# Patient Record
Sex: Male | Born: 1960 | Race: Black or African American | Hispanic: No | Marital: Single | Smoking: Never smoker
Health system: Southern US, Community
[De-identification: ages and names within clinical notes are randomized; demographics above are authoritative.]

## PROBLEM LIST (undated history)

## (undated) ENCOUNTER — Telehealth

## (undated) ENCOUNTER — Encounter

## (undated) ENCOUNTER — Encounter
Attending: Student in an Organized Health Care Education/Training Program | Primary: Student in an Organized Health Care Education/Training Program

## (undated) ENCOUNTER — Ambulatory Visit: Payer: Medicare (Managed Care)

## (undated) ENCOUNTER — Ambulatory Visit: Payer: MEDICARE

## (undated) ENCOUNTER — Ambulatory Visit
Payer: MEDICARE | Attending: Student in an Organized Health Care Education/Training Program | Primary: Student in an Organized Health Care Education/Training Program

## (undated) ENCOUNTER — Ambulatory Visit
Payer: Medicare (Managed Care) | Attending: Student in an Organized Health Care Education/Training Program | Primary: Student in an Organized Health Care Education/Training Program

## (undated) ENCOUNTER — Encounter: Payer: MEDICARE | Attending: Ophthalmology | Primary: Ophthalmology

## (undated) ENCOUNTER — Ambulatory Visit

## (undated) ENCOUNTER — Telehealth
Attending: Student in an Organized Health Care Education/Training Program | Primary: Student in an Organized Health Care Education/Training Program

## (undated) ENCOUNTER — Other Ambulatory Visit

## (undated) ENCOUNTER — Encounter: Payer: MEDICARE | Attending: Nurse Practitioner | Primary: Nurse Practitioner

## (undated) ENCOUNTER — Encounter: Attending: Nurse Practitioner | Primary: Nurse Practitioner

## (undated) ENCOUNTER — Encounter: Attending: Pharmacist | Primary: Pharmacist

## (undated) ENCOUNTER — Encounter: Attending: Internal Medicine | Primary: Internal Medicine

## (undated) ENCOUNTER — Telehealth: Attending: Dermatology | Primary: Dermatology

## (undated) ENCOUNTER — Non-Acute Institutional Stay: Payer: PRIVATE HEALTH INSURANCE

## (undated) ENCOUNTER — Encounter: Attending: Dermatology | Primary: Dermatology

## (undated) ENCOUNTER — Encounter: Attending: MOHS-Micrographic Surgery | Primary: MOHS-Micrographic Surgery

## (undated) ENCOUNTER — Encounter: Attending: Physician Assistant | Primary: Physician Assistant

## (undated) DIAGNOSIS — A5149 Other secondary syphilitic conditions: Secondary | ICD-10-CM

## (undated) DIAGNOSIS — B20 Human immunodeficiency virus [HIV] disease: Secondary | ICD-10-CM

## (undated) DIAGNOSIS — R569 Unspecified convulsions: Secondary | ICD-10-CM

## (undated) DIAGNOSIS — E119 Type 2 diabetes mellitus without complications: Secondary | ICD-10-CM

## (undated) DIAGNOSIS — H5200 Hypermetropia, unspecified eye: Secondary | ICD-10-CM

## (undated) DIAGNOSIS — B259 Cytomegaloviral disease, unspecified: Secondary | ICD-10-CM

## (undated) DIAGNOSIS — I252 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 infarction: Secondary | ICD-10-CM

## (undated) DIAGNOSIS — M009 Pyogenic arthritis, unspecified: Secondary | ICD-10-CM

## (undated) DIAGNOSIS — J309 Allergic rhinitis, unspecified: Secondary | ICD-10-CM

## (undated) DIAGNOSIS — G609 Hereditary and idiopathic neuropathy, unspecified: Secondary | ICD-10-CM

## (undated) DIAGNOSIS — Z86718 Personal history of other venous thrombosis and embolism: Secondary | ICD-10-CM

## (undated) DIAGNOSIS — K4090 Unilateral inguinal hernia, without obstruction or gangrene, not specified as recurrent: Secondary | ICD-10-CM

## (undated) DIAGNOSIS — H33319 Horseshoe tear of retina without detachment, unspecified eye: Secondary | ICD-10-CM

## (undated) DIAGNOSIS — B081 Molluscum contagiosum: Secondary | ICD-10-CM

## (undated) DIAGNOSIS — I2699 Other pulmonary embolism without acute cor pulmonale: Secondary | ICD-10-CM

## (undated) DIAGNOSIS — IMO0001 Reserved for inherently not codable concepts without codable children: Secondary | ICD-10-CM

## (undated) DIAGNOSIS — L209 Atopic dermatitis, unspecified: Secondary | ICD-10-CM

## (undated) DIAGNOSIS — K6282 Dysplasia of anus: Secondary | ICD-10-CM

## (undated) DIAGNOSIS — Z21 Asymptomatic human immunodeficiency virus [HIV] infection status: Secondary | ICD-10-CM

## (undated) DIAGNOSIS — K219 Gastro-esophageal reflux disease without esophagitis: Secondary | ICD-10-CM

## (undated) DIAGNOSIS — I82409 Acute embolism and thrombosis of unspecified deep veins of unspecified lower extremity: Secondary | ICD-10-CM

## (undated) DIAGNOSIS — J069 Acute upper respiratory infection, unspecified: Secondary | ICD-10-CM

## (undated) DIAGNOSIS — H20039 Secondary infectious iridocyclitis, unspecified eye: Secondary | ICD-10-CM

## (undated) HISTORY — DX: Cytomegaloviral disease, unspecified: B25.9

## (undated) HISTORY — DX: Unspecified convulsions: R56.9

## (undated) HISTORY — DX: Human immunodeficiency virus (HIV) disease: B20

## (undated) HISTORY — DX: Hypermetropia, unspecified eye: H52.00

## (undated) HISTORY — DX: Hereditary and idiopathic neuropathy, unspecified: G60.9

## (undated) HISTORY — DX: Type 2 diabetes mellitus without complications: E11.9

## (undated) HISTORY — DX: Atopic dermatitis, unspecified: L20.9

## (undated) HISTORY — DX: Other secondary syphilitic conditions: A51.49

## (undated) HISTORY — DX: Molluscum contagiosum: B08.1

## (undated) HISTORY — DX: Other pulmonary embolism without acute cor pulmonale: I26.99

## (undated) HISTORY — DX: 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 infarction: I25.2

## (undated) HISTORY — DX: Gastro-esophageal reflux disease without esophagitis: K21.9

## (undated) HISTORY — DX: Acute upper respiratory infection, unspecified: J06.9

## (undated) HISTORY — DX: Allergic rhinitis, unspecified: J30.9

## (undated) HISTORY — DX: Acute embolism and thrombosis of unspecified deep veins of unspecified lower extremity: I82.409

## (undated) HISTORY — PX: COLONOSCOPY: SHX5424

## (undated) HISTORY — DX: Personal history of other venous thrombosis and embolism: Z86.718

## (undated) HISTORY — DX: Horseshoe tear of retina without detachment, unspecified eye: H33.319

## (undated) HISTORY — DX: Secondary infectious iridocyclitis, unspecified eye: H20.039

## (undated) HISTORY — DX: Unilateral inguinal hernia, without obstruction or gangrene, not specified as recurrent: K40.90

## (undated) HISTORY — DX: Dysplasia of anus: K62.82

---
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
## 2010-09-08 DIAGNOSIS — A5149 Other secondary syphilitic conditions: Secondary | ICD-10-CM

## 2010-09-08 DIAGNOSIS — G609 Hereditary and idiopathic neuropathy, unspecified: Secondary | ICD-10-CM

## 2010-09-08 HISTORY — DX: Other secondary syphilitic conditions: A51.49

## 2010-09-08 HISTORY — DX: Hereditary and idiopathic neuropathy, unspecified: G60.9

## 2011-05-09 DIAGNOSIS — I82409 Acute embolism and thrombosis of unspecified deep veins of unspecified lower extremity: Secondary | ICD-10-CM

## 2011-05-09 HISTORY — DX: Acute embolism and thrombosis of unspecified deep veins of unspecified lower extremity: I82.409

## 2012-02-13 DIAGNOSIS — B259 Cytomegaloviral disease, unspecified: Secondary | ICD-10-CM | POA: Insufficient documentation

## 2012-02-13 DIAGNOSIS — H33319 Horseshoe tear of retina without detachment, unspecified eye: Secondary | ICD-10-CM | POA: Insufficient documentation

## 2012-02-13 HISTORY — DX: Cytomegaloviral disease, unspecified: B25.9

## 2012-02-13 HISTORY — DX: Horseshoe tear of retina without detachment, unspecified eye: H33.319

## 2012-02-18 DIAGNOSIS — H5200 Hypermetropia, unspecified eye: Secondary | ICD-10-CM

## 2012-02-18 HISTORY — DX: Hypermetropia, unspecified eye: H52.00

## 2012-09-29 DIAGNOSIS — B081 Molluscum contagiosum: Secondary | ICD-10-CM | POA: Insufficient documentation

## 2012-09-29 HISTORY — DX: Molluscum contagiosum: B08.1

## 2012-12-29 DIAGNOSIS — K6282 Dysplasia of anus: Secondary | ICD-10-CM | POA: Insufficient documentation

## 2013-04-09 DIAGNOSIS — I279 Pulmonary heart disease, unspecified: Secondary | ICD-10-CM | POA: Insufficient documentation

## 2013-04-09 DIAGNOSIS — R569 Unspecified convulsions: Secondary | ICD-10-CM | POA: Insufficient documentation

## 2013-04-09 DIAGNOSIS — G589 Mononeuropathy, unspecified: Secondary | ICD-10-CM | POA: Insufficient documentation

## 2013-04-09 DIAGNOSIS — I5032 Chronic diastolic (congestive) heart failure: Secondary | ICD-10-CM | POA: Insufficient documentation

## 2013-04-09 DIAGNOSIS — I5030 Unspecified diastolic (congestive) heart failure: Secondary | ICD-10-CM | POA: Insufficient documentation

## 2013-04-09 DIAGNOSIS — Z9229 Personal history of other drug therapy: Secondary | ICD-10-CM | POA: Insufficient documentation

## 2013-04-09 DIAGNOSIS — M109 Gout, unspecified: Secondary | ICD-10-CM | POA: Insufficient documentation

## 2013-04-09 DIAGNOSIS — I509 Heart failure, unspecified: Secondary | ICD-10-CM | POA: Insufficient documentation

## 2013-04-09 DIAGNOSIS — Z91040 Latex allergy status: Secondary | ICD-10-CM | POA: Insufficient documentation

## 2013-04-09 DIAGNOSIS — G40909 Epilepsy, unspecified, not intractable, without status epilepticus: Secondary | ICD-10-CM

## 2013-04-09 DIAGNOSIS — I252 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 infarction: Secondary | ICD-10-CM | POA: Insufficient documentation

## 2013-04-09 DIAGNOSIS — K4090 Unilateral inguinal hernia, without obstruction or gangrene, not specified as recurrent: Secondary | ICD-10-CM

## 2013-04-09 DIAGNOSIS — K219 Gastro-esophageal reflux disease without esophagitis: Secondary | ICD-10-CM | POA: Insufficient documentation

## 2013-04-09 DIAGNOSIS — I2699 Other pulmonary embolism without acute cor pulmonale: Secondary | ICD-10-CM

## 2013-04-09 DIAGNOSIS — I10 Essential (primary) hypertension: Secondary | ICD-10-CM | POA: Insufficient documentation

## 2013-04-09 HISTORY — DX: Epilepsy, unspecified, not intractable, without status epilepticus: G40.909

## 2013-04-09 HISTORY — DX: Unspecified convulsions: R56.9

## 2013-04-09 HISTORY — DX: Other pulmonary embolism without acute cor pulmonale: I26.99

## 2013-04-09 HISTORY — DX: Unilateral inguinal hernia, without obstruction or gangrene, not specified as recurrent: K40.90

## 2013-11-11 LAB — HM COLONOSCOPY: HM COLON: NORMAL

## 2014-02-25 ENCOUNTER — Other Ambulatory Visit (HOSPITAL_COMMUNITY)
Admission: RE | Admit: 2014-02-25 | Discharge: 2014-02-25 | Disposition: A | Discharge status: Home / Self Care | Payer: Medicare Other | Source: Ambulatory Visit | Attending: Infectious Disease | Admitting: Infectious Disease

## 2014-02-25 ENCOUNTER — Ambulatory Visit: Payer: Medicare Other

## 2014-02-25 DIAGNOSIS — B081 Molluscum contagiosum: Secondary | ICD-10-CM

## 2014-02-25 DIAGNOSIS — B20 Human immunodeficiency virus [HIV] disease: Secondary | ICD-10-CM

## 2014-02-25 DIAGNOSIS — I509 Heart failure, unspecified: Secondary | ICD-10-CM

## 2014-02-25 DIAGNOSIS — J45909 Unspecified asthma, uncomplicated: Secondary | ICD-10-CM

## 2014-02-25 DIAGNOSIS — Z91040 Latex allergy status: Secondary | ICD-10-CM

## 2014-02-25 DIAGNOSIS — I5032 Chronic diastolic (congestive) heart failure: Secondary | ICD-10-CM

## 2014-02-25 DIAGNOSIS — Z7901 Long term (current) use of anticoagulants: Secondary | ICD-10-CM

## 2014-02-25 DIAGNOSIS — I2699 Other pulmonary embolism without acute cor pulmonale: Secondary | ICD-10-CM

## 2014-02-25 DIAGNOSIS — F3289 Other specified depressive episodes: Secondary | ICD-10-CM

## 2014-02-25 DIAGNOSIS — I279 Pulmonary heart disease, unspecified: Secondary | ICD-10-CM

## 2014-02-25 DIAGNOSIS — Z113 Encounter for screening for infections with a predominantly sexual mode of transmission: Secondary | ICD-10-CM | POA: Insufficient documentation

## 2014-02-25 DIAGNOSIS — I2510 Atherosclerotic heart disease of native coronary artery without angina pectoris: Secondary | ICD-10-CM

## 2014-02-25 DIAGNOSIS — M109 Gout, unspecified: Secondary | ICD-10-CM

## 2014-02-25 DIAGNOSIS — L309 Dermatitis, unspecified: Secondary | ICD-10-CM

## 2014-02-25 DIAGNOSIS — A5149 Other secondary syphilitic conditions: Secondary | ICD-10-CM

## 2014-02-25 DIAGNOSIS — F329 Major depressive disorder, single episode, unspecified: Secondary | ICD-10-CM

## 2014-02-25 LAB — CBC WITH DIFFERENTIAL/PLATELET
BASOS PCT: 1 % (ref 0–1)
Basophils Absolute: 0 10*3/uL (ref 0.0–0.1)
Eosinophils Absolute: 0 10*3/uL (ref 0.0–0.7)
Eosinophils Relative: 1 % (ref 0–5)
HCT: 40.2 % (ref 39.0–52.0)
HEMOGLOBIN: 13.9 g/dL (ref 13.0–17.0)
LYMPHS ABS: 1.1 10*3/uL (ref 0.7–4.0)
LYMPHS PCT: 28 % (ref 12–46)
MCH: 30.3 pg (ref 26.0–34.0)
MCHC: 34.6 g/dL (ref 30.0–36.0)
MCV: 87.6 fL (ref 78.0–100.0)
Monocytes Absolute: 0.4 10*3/uL (ref 0.1–1.0)
Monocytes Relative: 9 % (ref 3–12)
NEUTROS PCT: 61 % (ref 43–77)
Neutro Abs: 2.4 10*3/uL (ref 1.7–7.7)
Platelets: 251 10*3/uL (ref 150–400)
RBC: 4.59 MIL/uL (ref 4.22–5.81)
RDW: 15.1 % (ref 11.5–15.5)
WBC: 3.9 10*3/uL — AB (ref 4.0–10.5)

## 2014-02-25 LAB — COMPLETE METABOLIC PANEL WITH GFR
ALK PHOS: 56 U/L (ref 39–117)
ALT: 24 U/L (ref 0–53)
AST: 23 U/L (ref 0–37)
Albumin: 4 g/dL (ref 3.5–5.2)
BUN: 8 mg/dL (ref 6–23)
CO2: 32 meq/L (ref 19–32)
Calcium: 9.4 mg/dL (ref 8.4–10.5)
Chloride: 101 mEq/L (ref 96–112)
Creat: 0.94 mg/dL (ref 0.50–1.35)
GFR, Est African American: >89 mL/min
Glucose, Bld: 114 mg/dL — ABNORMAL HIGH (ref 70–99)
POTASSIUM: 4 meq/L (ref 3.5–5.3)
Sodium: 138 mEq/L (ref 135–145)
Total Bilirubin: 0.4 mg/dL (ref 0.2–1.2)
Total Protein: 7.5 g/dL (ref 6.0–8.3)

## 2014-02-25 LAB — LIPID PANEL
CHOL/HDL RATIO: 3.2 ratio
CHOLESTEROL: 145 mg/dL (ref 0–200)
HDL: 46 mg/dL (ref >39)
LDL Cholesterol: 76 mg/dL (ref 0–99)
TRIGLYCERIDES: 113 mg/dL (ref <150)
VLDL: 23 mg/dL (ref 0–40)

## 2014-02-25 LAB — RPR

## 2014-02-26 DIAGNOSIS — F32A Depression, unspecified: Secondary | ICD-10-CM | POA: Insufficient documentation

## 2014-02-26 DIAGNOSIS — F3289 Other specified depressive episodes: Secondary | ICD-10-CM | POA: Insufficient documentation

## 2014-02-26 DIAGNOSIS — I2510 Atherosclerotic heart disease of native coronary artery without angina pectoris: Secondary | ICD-10-CM | POA: Insufficient documentation

## 2014-02-26 DIAGNOSIS — F329 Major depressive disorder, single episode, unspecified: Secondary | ICD-10-CM | POA: Insufficient documentation

## 2014-02-26 DIAGNOSIS — J45909 Unspecified asthma, uncomplicated: Secondary | ICD-10-CM | POA: Insufficient documentation

## 2014-02-26 DIAGNOSIS — Z7901 Long term (current) use of anticoagulants: Secondary | ICD-10-CM | POA: Insufficient documentation

## 2014-02-26 DIAGNOSIS — L309 Dermatitis, unspecified: Secondary | ICD-10-CM | POA: Insufficient documentation

## 2014-02-26 LAB — HIV-1 RNA QUANT-NO REFLEX-BLD
HIV 1 RNA Quant: <20 copies/mL (ref <20)
HIV-1 RNA Quant, Log: <1.3 {Log} (ref <1.30)

## 2014-02-26 LAB — URINALYSIS
BILIRUBIN URINE: NEGATIVE
GLUCOSE, UA: NEGATIVE mg/dL
Hgb urine dipstick: NEGATIVE
KETONES UR: NEGATIVE mg/dL
Leukocytes, UA: NEGATIVE
Nitrite: NEGATIVE
Protein, ur: 30 mg/dL — AB
SPECIFIC GRAVITY, URINE: 1.026 (ref 1.005–1.030)
Urobilinogen, UA: 1 mg/dL (ref 0.0–1.0)
pH: 6.5 (ref 5.0–8.0)

## 2014-02-26 LAB — HEPATITIS B CORE ANTIBODY, TOTAL: HEP B C TOTAL AB: REACTIVE — AB

## 2014-02-26 LAB — URINE CYTOLOGY ANCILLARY ONLY
CHLAMYDIA, DNA PROBE: NEGATIVE
Neisseria Gonorrhea: NEGATIVE

## 2014-02-26 LAB — HEPATITIS A ANTIBODY, TOTAL: Hep A Total Ab: REACTIVE — AB

## 2014-02-26 LAB — T-HELPER CELL (CD4) - (RCID CLINIC ONLY)
CD4 T CELL HELPER: 43 % (ref 33–55)
CD4 T Cell Abs: 500 /uL (ref 400–2700)

## 2014-02-26 LAB — HEPATITIS B SURFACE ANTIBODY,QUALITATIVE: Hep B S Ab: POSITIVE — AB

## 2014-02-26 LAB — HEPATITIS C ANTIBODY: HCV Ab: NEGATIVE

## 2014-02-26 LAB — HEPATITIS B SURFACE ANTIGEN: Hepatitis B Surface Ag: NEGATIVE

## 2014-02-26 NOTE — Progress Notes (Signed)
Patient is transferring [facility] due to travel distance.  He would also like to transfer his anti-coagulant care to  locally as well.  He has  asked for assistance with housing and a [facility] was [facility].   Medical records received Vaccines updated.  Laurell Josephsammy K King, RN

## 2014-03-01 ENCOUNTER — Telehealth: Payer: Self-pay

## 2014-03-01 NOTE — Telephone Encounter (Signed)
I was not able to reach patient via phone.  Letter sent with information regarding new patient Internal Medicine Referral.   Both appointments are on same day.   He was advised to call if he was not able to keep appointment.  Laurell Josephsammy K Alegandra Sommers, RN

## 2014-03-08 ENCOUNTER — Encounter: Payer: Self-pay | Admitting: Internal Medicine

## 2014-03-08 ENCOUNTER — Ambulatory Visit (INDEPENDENT_AMBULATORY_CARE_PROVIDER_SITE_OTHER): Payer: Medicare Other | Admitting: Pharmacist

## 2014-03-08 ENCOUNTER — Ambulatory Visit (INDEPENDENT_AMBULATORY_CARE_PROVIDER_SITE_OTHER): Payer: Medicare Other | Admitting: Infectious Disease

## 2014-03-08 ENCOUNTER — Encounter: Payer: Self-pay | Admitting: Infectious Disease

## 2014-03-08 ENCOUNTER — Ambulatory Visit (INDEPENDENT_AMBULATORY_CARE_PROVIDER_SITE_OTHER): Payer: Medicare Other | Admitting: Internal Medicine

## 2014-03-08 VITALS — BP 135/80 | HR 77 | Temp 97.8°F | Ht 73.0 in | Wt 233.4 lb

## 2014-03-08 VITALS — BP 146/95 | HR 76 | Temp 97.9°F | Ht 73.0 in | Wt 231.0 lb

## 2014-03-08 DIAGNOSIS — I509 Heart failure, unspecified: Secondary | ICD-10-CM

## 2014-03-08 DIAGNOSIS — L309 Dermatitis, unspecified: Secondary | ICD-10-CM

## 2014-03-08 DIAGNOSIS — Z86718 Personal history of other venous thrombosis and embolism: Secondary | ICD-10-CM | POA: Insufficient documentation

## 2014-03-08 DIAGNOSIS — I2782 Chronic pulmonary embolism: Secondary | ICD-10-CM

## 2014-03-08 DIAGNOSIS — Z86711 Personal history of pulmonary embolism: Secondary | ICD-10-CM | POA: Insufficient documentation

## 2014-03-08 DIAGNOSIS — Z21 Asymptomatic human immunodeficiency virus [HIV] infection status: Secondary | ICD-10-CM | POA: Insufficient documentation

## 2014-03-08 DIAGNOSIS — Z7901 Long term (current) use of anticoagulants: Secondary | ICD-10-CM

## 2014-03-08 DIAGNOSIS — J45909 Unspecified asthma, uncomplicated: Secondary | ICD-10-CM

## 2014-03-08 DIAGNOSIS — I2510 Atherosclerotic heart disease of native coronary artery without angina pectoris: Secondary | ICD-10-CM

## 2014-03-08 DIAGNOSIS — L259 Unspecified contact dermatitis, unspecified cause: Secondary | ICD-10-CM

## 2014-03-08 DIAGNOSIS — I5032 Chronic diastolic (congestive) heart failure: Secondary | ICD-10-CM

## 2014-03-08 DIAGNOSIS — Z5181 Encounter for therapeutic drug level monitoring: Secondary | ICD-10-CM

## 2014-03-08 DIAGNOSIS — B20 Human immunodeficiency virus [HIV] disease: Secondary | ICD-10-CM | POA: Insufficient documentation

## 2014-03-08 DIAGNOSIS — Z23 Encounter for immunization: Secondary | ICD-10-CM

## 2014-03-08 DIAGNOSIS — I10 Essential (primary) hypertension: Secondary | ICD-10-CM

## 2014-03-08 LAB — POCT INR: INR: 2.4

## 2014-03-08 MED ORDER — LISINOPRIL 20 MG PO TABS: 20.0000 mg | ORAL_TABLET | Freq: Every day | ORAL | Status: DC

## 2014-03-08 NOTE — Patient Instructions (Signed)
Please see Dr Alexandria LodgeGroce today for Coumadin management Continue to take your medications as prescribed. Call our office if you need refills or instruct your pharmacy to call us.

## 2014-03-08 NOTE — Assessment & Plan Note (Signed)
Patient doing well with QVAR and Albuterol PRN, continue.

## 2014-03-08 NOTE — Patient Instructions (Signed)
Patient instructed to take medications as defined in the Anti-coagulation Track section of this encounter.  Patient instructed to take today's dose.  Patient was provided all new (for this clinic--as his care is being transferred [facility] warfarin teaching materials as well as his dose response specific instruction sheet showing 7.5mg  on M/Th; 10mg  all other days using his 5mg  warfarin strength tablets.  Patient verbalized understanding of these instructions.

## 2014-03-08 NOTE — Assessment & Plan Note (Addendum)
Patient will continue Coumadin, of note he was taking 5mg  tablets not 7.5mg  tablets and takes a total of 7.5mg  on Monday and Friday and 10mg  other days. He will see Dr. Alexandria LodgeGroce today for INR check.

## 2014-03-08 NOTE — Assessment & Plan Note (Addendum)
Patient weight at roughly baseline (at last weight but +2-3lbs from previous visit) [facility]. Continue ACEi, Lasix 40mg  daily.  Would not start BB at this time given lack of evidence of morbidity or mortality benefit and patient has asthma.  Cholesterol at goal without medication.

## 2014-03-08 NOTE — Progress Notes (Signed)
   Subjective:    Patient ID: Craig [name], male    DOB: May 24, 1961, 53 y.o.   MRN: [id number]  HPI  53 year old African American male with HIV, multiple medical problems including eczema (on MTX), DVT with PE, dCHF, HTN, ? MI, prior AKI who states that I was his ID MD as a [facility] his [facility]. He has been perfectly suppressed on PRezista, Norvir and Truvada.     Review of Systems  Constitutional: Negative for fever, chills, diaphoresis, activity change, appetite change, fatigue and unexpected weight change.  HENT: Negative for congestion, rhinorrhea, sinus pressure, sneezing, sore throat and trouble swallowing.   Eyes: Negative for photophobia and visual disturbance.  Respiratory: Negative for cough, chest tightness, shortness of breath, wheezing and stridor.   Cardiovascular: Negative for chest pain, palpitations and leg swelling.  Gastrointestinal: Negative for nausea, vomiting, abdominal pain, diarrhea, constipation, blood in stool, abdominal distention and anal bleeding.  Genitourinary: Negative for dysuria, hematuria, flank pain and difficulty urinating.  Musculoskeletal: Negative for arthralgias, back pain, gait problem, joint swelling and myalgias.  Skin: Negative for color change, pallor, rash and wound.  Neurological: Negative for dizziness, tremors, weakness and light-headedness.  Hematological: Negative for adenopathy. Does not bruise/bleed easily.  Psychiatric/Behavioral: Negative for behavioral problems, confusion, sleep disturbance, dysphoric mood, decreased concentration and agitation.       Objective:   Physical Exam  Constitutional: He is oriented to person, place, and time. He appears well-developed and well-nourished. No distress.  HENT:  Head: Normocephalic and atraumatic.  Mouth/Throat: Oropharynx is clear and moist. No oropharyngeal exudate.  Eyes: Conjunctivae and EOM are normal. No scleral icterus.  Neck: Normal range of  motion. Neck supple. No JVD present.  Cardiovascular: Normal rate, regular rhythm and normal heart sounds.  Exam reveals no gallop and no friction rub.   No murmur heard. Pulmonary/Chest: Effort normal and breath sounds normal. No respiratory distress. He has no wheezes. He has no rales. He exhibits no tenderness.  Abdominal: He exhibits no distension and no mass. There is no tenderness. There is no rebound and no guarding.  Musculoskeletal: He exhibits no edema and no tenderness.  Lymphadenopathy:    He has no cervical adenopathy.  Neurological: He is alert and oriented to person, place, and time. He exhibits normal muscle tone. Coordination normal.  Skin: Skin is warm and dry. He is not diaphoretic. No erythema. No pallor.  Psychiatric: He has a normal mood and affect. His behavior is normal. Judgment and thought content normal.          Assessment & Plan:   #1 HIV: continue Prezista, Norvir and Truvada for now. Will change to Prezcobix with Truvada when Medicare starts paying for Prezcobix .45    #2 DVT, PE: in need of monitoring of his coumadin, I suggested that he could have this done by Dr. Alexandria LodgeGroce [facility]  #3 ? MI: Not clear to me that he actually has known CAD or whether he had MI as secondary effect. He is not currently on statin  #3 Eczema: on MTX with folate by Dermatology. MTX may have some beneficial effects on inflammation due to HIV reservoir as well  #4 HTN: at [facility]

## 2014-03-08 NOTE — Progress Notes (Signed)
Anti-Coagulation Progress Note  Craig [name] is a 53 y.o. male who is currently on an anti-coagulation regimen.    RECENT RESULTS: Recent results are below, the most recent result is correlated with a dose of 65 mg. per week: Lab Results  Component Value Date   INR 2.40 03/08/2014    ANTI-COAG DOSE: Anticoagulation Dose Instructions as of 03/08/2014     Glynis SmilesSun Mon Tue Wed Thu Fri Sat   New Dose 10 mg 7.5 mg 10 mg 10 mg 7.5 mg 10 mg 10 mg       ANTICOAG SUMMARY: Anticoagulation Episode Summary   Current INR goal   Next INR check 03/29/2014  INR from last check 2.40 (03/08/2014)  Weekly max dose   Target end date Indefinite  INR check location [facility]  Preferred lab   Send INR reminders to    Indications  History of pulmonary embolus (PE) [V12.55] Long term (current) use of anticoagulants [V58.61] History of DVT (deep vein thrombosis) [V12.51]        Comments       Anticoagulation Care Providers   Provider Role Specialty Phone number   Carlynn PurlErik Hoffman, DO  Internal Medicine [phone]      ANTICOAG TODAY: Anticoagulation Summary as of 03/08/2014   INR goal   Selected INR 2.40 (03/08/2014)  Next INR check 03/29/2014  Target end date Indefinite   Indications  History of pulmonary embolus (PE) [V12.55] Long term (current) use of anticoagulants [V58.61] History of DVT (deep vein thrombosis) [V12.51]      Anticoagulation Episode Summary   INR check location [facility]   Preferred lab    Send INR reminders to    Comments     Anticoagulation Care Providers   Provider Role Specialty Phone number   Carlynn PurlErik Hoffman, DO  Internal Medicine [phone]      PATIENT INSTRUCTIONS: Patient Instructions  Patient instructed to take medications as defined in the Anti-coagulation Track section of this encounter.  Patient instructed to take today's dose.  Patient was provided all new (for this clinic--as his care is being [facility]) warfarin teaching materials as well as his dose response specific instruction sheet showing 7.5mg  on M/Th; 10mg  all other days using his 5mg  warfarin strength tablets.  Patient verbalized understanding of these instructions.       FOLLOW-UP Return in 3 weeks (on 03/29/2014) for Follow up INR at 1100h.  Hulen LusterJames Groce, III Pharm.D., CACP

## 2014-03-08 NOTE — Assessment & Plan Note (Signed)
BP Readings from Last 3 Encounters:  03/08/14 135/80  03/08/14 146/95    Lab Results  Component Value Date   NA 138 02/25/2014   K 4.0 02/25/2014   CREATININE 0.94 02/25/2014    Assessment: Blood pressure control: controlled Progress toward BP goal:  at goal Comments:   Plan: Medications:  Lisinopril 20mg  daily, Lasix 40mg  daily Educational resources provided: brochure Self management tools provided:   Other plans:

## 2014-03-08 NOTE — Patient Instructions (Signed)
We will try to send you new script for  PREZCOBIX 800/150 PINK PILL   TO BE TAKEN WITH  TRUVADA BLUE PILL ONCE DAILY  IF MEDICARE/MEDICAID WILL PAY FOR IT  IF NOT THEN CONTINUE THE PREZISTA NORVIR AND TRUVADA  COME BACK IN ONE MONTH FOR LABS AFTER STARTING THE PREZCOBIX AND TRUVADA

## 2014-03-08 NOTE — Assessment & Plan Note (Signed)
[facility] Dr. Amada KingfisherLugo Somolinos. Stable continue medications, patient will continue to follow with Dermatology.

## 2014-03-08 NOTE — Progress Notes (Signed)
Case discussed with Dr. Hoffman at the time of the visit.  We reviewed the resident's history and exam and pertinent patient test results.  I agree with the assessment, diagnosis, and plan of care documented in the resident's note. 

## 2014-03-08 NOTE — Progress Notes (Signed)
[facility] Subjective:   Patient ID: Craig [name] male   DOB: 08-09-1961 53 y.o.   MRN: [id number]  HPI: Mr.Craig [name] is a 53 y.o. male with a PMH significant for dCHF (last echo 12/29/13 Ef 60-65%, diastolic dysfunction), HIV (02/25/14  Cd4 500, undect VL), HTN, Asthma, Hx DVT/PE (on long term A/C with warfarin), eczema.  He reports infrequent wheezing and no asthma attacks, he uses albuterol inhaler 4-5 times a week. He moved to [place] in late 2014 and wants to reestablish care. He reports today he is feeling well.  He reports he takes all of his medications as prescribed and has not current issues with his medications.  He reports he has not been checking his weight regularly, he does not some mild SOB when going up stairs but he reports this is normal for him.  He denies any chest pain or chest pressure.   Past Medical History  Diagnosis Date  . 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 Personal history of venous thrombosis and embolism   . Dysplasia of anus   . Esophageal reflux   . Horseshoe tear of retina without detachment 02-13-12  . 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  . Molluscum contagiosum 09-29-2012  . 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 infarction   . Other convulsions   . Unspecified hereditary and idiopathic peripheral neuropathy   . Secondary iridocyclitis, infectious   . 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 PE (pulmonary embolism) 04/09/2013    Overview:  Jan 2012   . 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 Hernia, inguinal 04/09/2013  . 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 Deep vein thrombosis 05/09/2011    Overview:  Jan 2012    Current Outpatient Prescriptions  Medication Sig Dispense Refill  . albuterol (PROVENTIL HFA;VENTOLIN HFA) 108 (90 BASE) MCG/ACT inhaler Inhale 2 puffs into the lungs every 6 (six) hours as needed for wheezing or shortness of breath.      .  beclomethasone (QVAR) 40 MCG/ACT inhaler Inhale 1 puff into the lungs 2 (two) times daily.      . citalopram (CELEXA) 40 MG tablet Take 40 mg by mouth daily.      . clobetasol cream (TEMOVATE) 0.05 % Apply 1 application topically 2 (two) times daily.      . Darunavir Ethanolate (PREZISTA) 800 MG tablet Take 800 mg by mouth.      Marland Kitchen emtricitabine-tenofovir (TRUVADA) 200-300 MG per tablet Take 1 tablet by mouth daily.      . folic acid (FOLVITE) 1 MG tablet Take 1 mg by mouth daily.      . furosemide (LASIX) 40 MG tablet Take 40 mg by mouth.      . hydrOXYzine (ATARAX/VISTARIL) 25 MG tablet Take 25 mg by mouth every 6 (six) hours as needed.      . imiquimod (ALDARA) 5 % cream Apply topically 3 (three) times a week.      Marland Kitchen lisinopril (PRINIVIL,ZESTRIL) 20 MG tablet Take 1 tablet (20 mg total) by mouth daily.  30 tablet  5  . methotrexate (RHEUMATREX) 2.5 MG tablet Take 2.5 mg by mouth once a week. Caution:Chemotherapy. Protect from light. Take 6 tablets ( 15mg ) once a week.      . ranitidine (ZANTAC) 300 MG tablet Take 300 mg by mouth 2 (two) times daily.      . ritonavir (NORVIR) 100 MG capsule Take 100 mg by mouth daily with breakfast.      .  triamcinolone (KENALOG) 0.025 % ointment Apply 1 application topically 2 (two) times daily.      Marland Kitchen. warfarin (COUMADIN) 7.5 MG tablet Take 7.5 mg by mouth daily. Take 1.5 tabs Monday, Friday and 10 mg ( 2 tabs) other days or as directed. Dr. Salli Quarryussell Scott       No current facility-administered medications for this visit.   No family history on file. History   Social History  . Marital Status: Single    Spouse Name: N/A    Number of Children: N/A  . Years of Education: N/A   Social History Main Topics  . Smoking status: Never Smoker   . Smokeless tobacco: Not on file  . Alcohol Use: No  . Drug Use: No  . Sexual Activity: Not Currently    Partners: Male    Birth Control/ Protection: Condom   Other Topics Concern  . Not on file   Social  History Narrative  . No narrative on file   Review of Systems: Review of Systems  Constitutional: Negative for fever, chills, weight loss and malaise/fatigue.  HENT: Negative for congestion and sore throat.   Eyes: Positive for blurred vision (chronic, thinks he needs a new prescription).  Respiratory: Positive for wheezing (occassionally, uses inhaler). Negative for cough and shortness of breath.   Cardiovascular: Positive for orthopnea (usually sleeps in chair, when in bed uses 3 pillow) and leg swelling (occasionally). Negative for chest pain.  Gastrointestinal: Positive for diarrhea (with certain foods last week, now resolved). Negative for heartburn, nausea, vomiting and abdominal pain.  Genitourinary: Negative for dysuria.  Skin: Negative for rash.  Neurological: Negative for headaches.  Psychiatric/Behavioral: Positive for depression (occasionally). Negative for suicidal ideas and substance abuse. The patient is nervous/anxious.      Objective:  Physical Exam: Filed Vitals:   03/08/14 1330  BP: 135/80  Pulse: 77  Temp: 97.8 F (36.6 C)  TempSrc: Oral  Height: 6\' 1"  (1.854 m)  Weight: 233 lb 6.4 oz (105.87 kg)  SpO2: 100%  Physical Exam  Nursing note and vitals reviewed. Constitutional: He is well-developed, well-nourished, and in no distress. No distress.  HENT:  Head: Normocephalic and atraumatic.  Eyes: EOM are normal.  Cardiovascular: Normal rate, regular rhythm, normal heart sounds and intact distal pulses.   No murmur heard. Pulmonary/Chest: Effort normal and breath sounds normal. No respiratory distress. He has no wheezes. He has no rales.  Abdominal: Soft. Bowel sounds are normal. He exhibits no distension. There is no tenderness.  Musculoskeletal: He exhibits edema (1+ lower extremity edema to shin bilaterally). He exhibits no tenderness.  Skin: Skin is warm and dry. He is not diaphoretic.    Assessment & Plan:  Case discussed with Dr. Aundria Rudogers See Problem  Based Assessment and Plan Medications Ordered Meds ordered this encounter  Medications  . lisinopril (PRINIVIL,ZESTRIL) 20 MG tablet    Sig: Take 1 tablet (20 mg total) by mouth daily.    Dispense:  30 tablet    Refill:  5   Other Orders No orders of the defined types were placed in this encounter.

## 2014-03-09 NOTE — Progress Notes (Signed)
Indication: Deep venous thromboembolism.  Duration: Unclear.  INR: At target.  Agree with Dr. Saralyn [name]'s assessment and plan.  It is unclear if [name] has had recurrent venous thromboembolism or only a single event in 2012.  If this was a single event, it is unclear if [name] was educated on the risks and benefits of chronic anticoagulation and what his preference would be.  I will ask Dr. Mikey [name] to clarify these issues at the follow-up visit so we have a clearer understanding of the indications for chronic anticoagulation and the patient's educated preference on the issue.

## 2014-03-11 ENCOUNTER — Encounter: Payer: Self-pay | Admitting: Internal Medicine

## 2014-05-10 ENCOUNTER — Encounter: Payer: Self-pay | Admitting: Infectious Disease

## 2014-05-10 ENCOUNTER — Ambulatory Visit (INDEPENDENT_AMBULATORY_CARE_PROVIDER_SITE_OTHER): Payer: Medicare Other | Admitting: Infectious Disease

## 2014-05-10 VITALS — BP 107/72 | HR 108 | Temp 98.1°F | Ht 72.0 in | Wt 229.0 lb

## 2014-05-10 DIAGNOSIS — N179 Acute kidney failure, unspecified: Secondary | ICD-10-CM

## 2014-05-10 DIAGNOSIS — I2510 Atherosclerotic heart disease of native coronary artery without angina pectoris: Secondary | ICD-10-CM

## 2014-05-10 DIAGNOSIS — L309 Dermatitis, unspecified: Secondary | ICD-10-CM

## 2014-05-10 DIAGNOSIS — L259 Unspecified contact dermatitis, unspecified cause: Secondary | ICD-10-CM

## 2014-05-10 DIAGNOSIS — B20 Human immunodeficiency virus [HIV] disease: Secondary | ICD-10-CM

## 2014-05-10 DIAGNOSIS — I82409 Acute embolism and thrombosis of unspecified deep veins of unspecified lower extremity: Secondary | ICD-10-CM

## 2014-05-10 DIAGNOSIS — I2699 Other pulmonary embolism without acute cor pulmonale: Secondary | ICD-10-CM

## 2014-05-10 DIAGNOSIS — Z21 Asymptomatic human immunodeficiency virus [HIV] infection status: Secondary | ICD-10-CM

## 2014-05-10 LAB — COMPLETE METABOLIC PANEL WITH GFR
ALK PHOS: 75 U/L (ref 39–117)
ALT: 28 U/L (ref 0–53)
AST: 26 U/L (ref 0–37)
Albumin: 4.5 g/dL (ref 3.5–5.2)
BILIRUBIN TOTAL: 0.3 mg/dL (ref 0.2–1.2)
BUN: 26 mg/dL — ABNORMAL HIGH (ref 6–23)
CO2: 24 mEq/L (ref 19–32)
CREATININE: 1.66 mg/dL — AB (ref 0.50–1.35)
Calcium: 9.7 mg/dL (ref 8.4–10.5)
Chloride: 106 mEq/L (ref 96–112)
GFR, Est African American: 54 mL/min — ABNORMAL LOW
GFR, Est Non African American: 47 mL/min — ABNORMAL LOW
Glucose, Bld: 77 mg/dL (ref 70–99)
Potassium: 4.9 mEq/L (ref 3.5–5.3)
Sodium: 136 mEq/L (ref 135–145)
Total Protein: 8 g/dL (ref 6.0–8.3)

## 2014-05-10 LAB — CBC WITH DIFFERENTIAL/PLATELET
Basophils Absolute: 0 10*3/uL (ref 0.0–0.1)
Basophils Relative: 0 % (ref 0–1)
Eosinophils Absolute: 0.1 10*3/uL (ref 0.0–0.7)
Eosinophils Relative: 1 % (ref 0–5)
HCT: 40.9 % (ref 39.0–52.0)
HEMOGLOBIN: 14.2 g/dL (ref 13.0–17.0)
LYMPHS ABS: 1.6 10*3/uL (ref 0.7–4.0)
LYMPHS PCT: 27 % (ref 12–46)
MCH: 29.2 pg (ref 26.0–34.0)
MCHC: 34.7 g/dL (ref 30.0–36.0)
MCV: 84.2 fL (ref 78.0–100.0)
MONOS PCT: 10 % (ref 3–12)
Monocytes Absolute: 0.6 10*3/uL (ref 0.1–1.0)
NEUTROS PCT: 62 % (ref 43–77)
Neutro Abs: 3.6 10*3/uL (ref 1.7–7.7)
Platelets: 250 10*3/uL (ref 150–400)
RBC: 4.86 MIL/uL (ref 4.22–5.81)
RDW: 16.2 % — ABNORMAL HIGH (ref 11.5–15.5)
WBC: 5.8 10*3/uL (ref 4.0–10.5)

## 2014-05-10 MED ORDER — RITONAVIR 100 MG PO CAPS: 100.0000 mg | ORAL_CAPSULE | Freq: Every day | ORAL | Status: DC

## 2014-05-10 NOTE — Progress Notes (Signed)
Subjective:    Patient ID: Craig [name], male    DOB: 1961/07/26, 53 y.o.   MRN: [id number]  HPI   53 year old African American male with HIV, multiple medical problems including eczema (on MTX), DVT with PE, dCHF, HTN, ? MI, prior AKI [facility]. He has been perfectly suppressed on PRezista, Norvir and Truvada. [facility] with syncope in context of dehydration and ARF.  Creatinine has subsequently recovered. [facility] He was sent here for followup post hospital dc.      Review of Systems  Constitutional: Negative for fever, chills, diaphoresis, activity change, appetite change, fatigue and unexpected weight change.  HENT: Negative for congestion, rhinorrhea, sinus pressure, sneezing, sore throat and trouble swallowing.   Eyes: Negative for photophobia and visual disturbance.  Respiratory: Negative for cough, chest tightness, shortness of breath, wheezing and stridor.   Cardiovascular: Negative for chest pain, palpitations and leg swelling.  Gastrointestinal: Negative for nausea, vomiting, abdominal pain, diarrhea, constipation, blood in stool, abdominal distention and anal bleeding.  Genitourinary: Negative for dysuria, hematuria, flank pain and difficulty urinating.  Musculoskeletal: Negative for arthralgias, back pain, gait problem, joint swelling and myalgias.  Skin: Negative for color change, pallor, rash and wound.  Neurological: Negative for dizziness, tremors, weakness and light-headedness.  Hematological: Negative for adenopathy. Does not bruise/bleed easily.  Psychiatric/Behavioral: Negative for behavioral problems, confusion, sleep disturbance, dysphoric mood, decreased concentration and agitation.       Objective:   Physical Exam  Constitutional: He is oriented to person, place, and time. He appears well-developed and  well-nourished. No distress.  HENT:  Head: Normocephalic and atraumatic.  Mouth/Throat: Oropharynx is clear and moist. No oropharyngeal exudate.  Eyes: Conjunctivae and EOM are normal. No scleral icterus.  Neck: Normal range of motion. Neck supple. No JVD present.  Cardiovascular: Normal rate, regular rhythm and normal heart sounds.  Exam reveals no gallop and no friction rub.   No murmur heard. Pulmonary/Chest: Effort normal and breath sounds normal. No respiratory distress. He has no wheezes. He has no rales. He exhibits no tenderness.  Abdominal: He exhibits no distension and no mass. There is no tenderness. There is no rebound and no guarding.  Musculoskeletal: He exhibits no edema and no tenderness.  Lymphadenopathy:    He has no cervical adenopathy.  Neurological: He is alert and oriented to person, place, and time. He exhibits normal muscle tone. Coordination normal.  Skin: Skin is warm and dry. He is not diaphoretic. No erythema. No pallor.  Psychiatric: He has a normal mood and affect. His behavior is normal. Judgment and thought content normal.          Assessment & Plan:   #1 HIV: continue Prezista, Norvir and Truvada for now. Check VL, CD4, cmp. Would like to  change to Prezcobix with Truvada though may run into more anxiety with him given his propensity to develop ARI in context of dehydration, diuretics etc.     #2 DVT, PE: in need of monitoring of his coumadin, [facility] trying to clarify why he needs chronic anticoagulation. [facility]  #3 ? MI: Not clear to me that he actually has known CAD or whether he had MI as secondary effect. He is not currently on statin  #3 Eczema: on MTX with  folate by Dermatology. MTX may have some beneficial effects on inflammation due to HIV reservoir as well

## 2014-05-11 ENCOUNTER — Telehealth: Payer: Self-pay | Admitting: *Deleted

## 2014-05-11 DIAGNOSIS — R7989 Other specified abnormal findings of blood chemistry: Secondary | ICD-10-CM

## 2014-05-11 NOTE — Telephone Encounter (Signed)
Generic voicemail without any identification.  Left message asking for patient to contact us today regarding lab results.  Andree CossMichelle M Howell, RN

## 2014-05-11 NOTE — Addendum Note (Signed)
Addended by: Andree CossHOWELL, Sereena Marando M on: 05/11/2014 01:48 PM   Modules accepted: Orders

## 2014-05-11 NOTE — Telephone Encounter (Signed)
Message copied by Andree CossHOWELL, Jaida Basurto M on Tue May 11, 2014 11:51 AM ------      Message from: VAN DAM, CORNELIUS N      Created: Tue May 11, 2014  8:45 AM       pts creatinine is up again to 1.66. Can we have him hold his lasix, lisinopril and have repeat bmp by Friday ------

## 2014-05-11 NOTE — Telephone Encounter (Signed)
Spoke with patient.  He understands to stop lisinopril and lasix ("blood pressure and water pills"), will come in Friday morning for repeat BMP.   Pt wants Dr. Daiva EvesVan Dam to know he is having multiple episodes of diarrhea daily - anywhere from 5 - 10 per day.  Please advise.

## 2014-05-12 LAB — T-HELPER CELL (CD4) - (RCID CLINIC ONLY)
CD4 T CELL ABS: 580 /uL (ref 400–2700)
CD4 T CELL HELPER: 35 % (ref 33–55)

## 2014-05-12 LAB — HIV-1 RNA QUANT-NO REFLEX-BLD

## 2014-05-14 ENCOUNTER — Other Ambulatory Visit: Payer: Medicare Other

## 2014-05-14 DIAGNOSIS — R7989 Other specified abnormal findings of blood chemistry: Secondary | ICD-10-CM

## 2014-05-14 LAB — BASIC METABOLIC PANEL
BUN: 13 mg/dL (ref 6–23)
CO2: 23 mEq/L (ref 19–32)
Calcium: 9.6 mg/dL (ref 8.4–10.5)
Chloride: 107 mEq/L (ref 96–112)
Creat: 1.16 mg/dL (ref 0.50–1.35)
Glucose, Bld: 88 mg/dL (ref 70–99)
POTASSIUM: 4.4 meq/L (ref 3.5–5.3)
SODIUM: 138 meq/L (ref 135–145)

## 2014-05-18 ENCOUNTER — Telehealth: Payer: Self-pay | Admitting: Licensed Clinical Social Worker

## 2014-05-18 NOTE — Telephone Encounter (Signed)
I think I would have him start his lasix first. He should followup with his PCP. Before on the blood pressure med and the diuretic he was dehydrated and in a bit of renal insufficiency

## 2014-05-18 NOTE — Telephone Encounter (Signed)
Perfect

## 2014-05-18 NOTE — Telephone Encounter (Signed)
Patient notified

## 2014-05-18 NOTE — Telephone Encounter (Signed)
Patient wants to know if he can start back on his blood pressure medication and lasix, please advise. He was taken off due to abnormal labs, had labs redrawn after being off medication now they are normal.

## 2014-05-24 ENCOUNTER — Other Ambulatory Visit: Payer: Medicare Other

## 2014-06-07 ENCOUNTER — Ambulatory Visit (INDEPENDENT_AMBULATORY_CARE_PROVIDER_SITE_OTHER): Payer: Medicare Other | Admitting: Infectious Disease

## 2014-06-07 ENCOUNTER — Encounter: Payer: Self-pay | Admitting: Infectious Disease

## 2014-06-07 VITALS — BP 136/91 | HR 87 | Temp 98.2°F | Wt 227.0 lb

## 2014-06-07 DIAGNOSIS — L309 Dermatitis, unspecified: Secondary | ICD-10-CM

## 2014-06-07 DIAGNOSIS — B20 Human immunodeficiency virus [HIV] disease: Secondary | ICD-10-CM

## 2014-06-07 DIAGNOSIS — Z21 Asymptomatic human immunodeficiency virus [HIV] infection status: Secondary | ICD-10-CM

## 2014-06-07 DIAGNOSIS — I2510 Atherosclerotic heart disease of native coronary artery without angina pectoris: Secondary | ICD-10-CM

## 2014-06-07 DIAGNOSIS — N179 Acute kidney failure, unspecified: Secondary | ICD-10-CM

## 2014-06-07 DIAGNOSIS — L259 Unspecified contact dermatitis, unspecified cause: Secondary | ICD-10-CM

## 2014-06-07 DIAGNOSIS — I82409 Acute embolism and thrombosis of unspecified deep veins of unspecified lower extremity: Secondary | ICD-10-CM

## 2014-06-07 NOTE — Progress Notes (Signed)
Subjective:    Patient ID: Craig [name], male    DOB: 1961-11-28, 53 y.o.   MRN: [id number]  HPI   53 year old African American male with HIV, multiple medical problems including eczema (on MTX), DVT with PE, dCHF, HTN, ? MI, prior AKI previously cared [facility]  [facility] where he has been receiving his care. He has been perfectly suppressed on PRezista, Norvir and Truvada. [facility] this April with syncope in context of dehydration and ARF.  Creatinine has subsequently recovered. [facility] but also [facility]. He was sent here for followup post hospital dc.   I had considered changing [facility] but given all his problems with recent renal insufficiency I preferred to hold off at may not consolidating change until his appointment in the fall.    Review of Systems  Constitutional: Negative for fever, chills, diaphoresis, activity change, appetite change, fatigue and unexpected weight change.  HENT: Negative for congestion, rhinorrhea, sinus pressure, sneezing, sore throat and trouble swallowing.   Eyes: Negative for photophobia and visual disturbance.  Respiratory: Negative for cough, chest tightness, shortness of breath, wheezing and stridor.   Cardiovascular: Negative for chest pain, palpitations and leg swelling.  Gastrointestinal: Negative for nausea, vomiting, abdominal pain, diarrhea, constipation, blood in stool, abdominal distention and anal bleeding.  Genitourinary: Negative for dysuria, hematuria, flank pain and difficulty urinating.  Musculoskeletal: Negative for arthralgias, back pain, gait problem, joint swelling and myalgias.  Skin: Negative for color change, pallor, rash and wound.  Neurological: Negative for dizziness, tremors, weakness and light-headedness.  Hematological: Negative for adenopathy. Does not bruise/bleed easily.  Psychiatric/Behavioral: Negative for behavioral problems, confusion, sleep disturbance,  dysphoric mood, decreased concentration and agitation.       Objective:   Physical Exam  Constitutional: He is oriented to person, place, and time. He appears well-developed and well-nourished. No distress.  HENT:  Head: Normocephalic and atraumatic.  Mouth/Throat: Oropharynx is clear and moist. No oropharyngeal exudate.  Eyes: Conjunctivae and EOM are normal. No scleral icterus.  Neck: Normal range of motion. Neck supple. No JVD present.  Cardiovascular: Normal rate, regular rhythm and normal heart sounds.  Exam reveals no gallop and no friction rub.   No murmur heard. Pulmonary/Chest: Effort normal and breath sounds normal. No respiratory distress. He has no wheezes. He has no rales. He exhibits no tenderness.  Abdominal: He exhibits no distension and no mass. There is no tenderness. There is no rebound and no guarding.  Musculoskeletal: He exhibits no edema and no tenderness.  Lymphadenopathy:    He has no cervical adenopathy.  Neurological: He is alert and oriented to person, place, and time. He exhibits normal muscle tone. Coordination normal.  Skin: Skin is warm and dry. He is not diaphoretic. No erythema. No pallor.  Psychiatric: He has a normal mood and affect. His behavior is normal. Judgment and thought content normal.          Assessment & Plan:   #1 HIV: continue Prezista, Norvir and Truvada for now.  Would like to  change to Prezcobix with Truvada though may run into more anxiety with him given his propensity to develop ARI in context of dehydration, diuretics etc. Consider making change in the fall. I spent greater than 25 minutes with the patient including greater than 50% of time in face to face counsel of the patient and in coordination of their care.     #2  DVT, PE: in need of monitoring of his coumadin, IMC trying to clarify why he needs chronic anticoagulation. [facility]  #3 ? MI: Not clear to me that he  actually has known CAD or whether he had MI as secondary effect. He is not currently on statin  #3 Eczema: on MTX with folate by Dermatology. MTX may have some beneficial effects on inflammation due to HIV reservoir as well  #4 Renal insufficiency: resolved but will hold off on going to PREZCOBIX for now

## 2014-06-17 ENCOUNTER — Encounter: Payer: Medicare Other | Admitting: Internal Medicine

## 2014-09-10 ENCOUNTER — Emergency Department (HOSPITAL_COMMUNITY)
Admission: EM | Admit: 2014-09-10 | Discharge: 2014-09-11 | Disposition: A | Discharge status: Home / Self Care | Payer: Medicare Other | Attending: Emergency Medicine | Admitting: Emergency Medicine

## 2014-09-10 ENCOUNTER — Encounter (HOSPITAL_COMMUNITY): Payer: Self-pay | Admitting: Emergency Medicine

## 2014-09-10 ENCOUNTER — Emergency Department (HOSPITAL_COMMUNITY): Payer: Medicare Other

## 2014-09-10 DIAGNOSIS — I252 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 infarction: Secondary | ICD-10-CM | POA: Diagnosis not present

## 2014-09-10 DIAGNOSIS — Z7901 Long term (current) use of anticoagulants: Secondary | ICD-10-CM | POA: Insufficient documentation

## 2014-09-10 DIAGNOSIS — IMO0002 Reserved for concepts with insufficient information to code with codable children: Secondary | ICD-10-CM | POA: Diagnosis not present

## 2014-09-10 DIAGNOSIS — Z86711 Personal history of pulmonary embolism: Secondary | ICD-10-CM | POA: Insufficient documentation

## 2014-09-10 DIAGNOSIS — K219 Gastro-esophageal reflux disease without esophagitis: Secondary | ICD-10-CM | POA: Insufficient documentation

## 2014-09-10 DIAGNOSIS — M25569 Pain in unspecified knee: Secondary | ICD-10-CM | POA: Insufficient documentation

## 2014-09-10 DIAGNOSIS — M25562 Pain in left knee: Secondary | ICD-10-CM

## 2014-09-10 DIAGNOSIS — Z8669 Personal history of other diseases of the nervous system and sense organs: Secondary | ICD-10-CM | POA: Insufficient documentation

## 2014-09-10 DIAGNOSIS — Z8619 Personal history of other infectious and parasitic diseases: Secondary | ICD-10-CM | POA: Insufficient documentation

## 2014-09-10 DIAGNOSIS — Z880 Allergy status to penicillin: Secondary | ICD-10-CM | POA: Diagnosis not present

## 2014-09-10 DIAGNOSIS — Z86718 Personal history of other venous thrombosis and embolism: Secondary | ICD-10-CM | POA: Insufficient documentation

## 2014-09-10 DIAGNOSIS — Z79899 Other long term (current) drug therapy: Secondary | ICD-10-CM | POA: Diagnosis not present

## 2014-09-10 DIAGNOSIS — Z91040 Latex allergy status: Secondary | ICD-10-CM | POA: Insufficient documentation

## 2014-09-10 NOTE — ED Provider Notes (Signed)
CSN: [id number]     Arrival date & time 09/10/14  2234 History  This chart was scribed for non-physician practitioner, Santiago Glad, PA-C,working with Toy Cookey, MD, by Karle Plumber, ED Scribe. This patient was seen in room TR05C/TR05C and the patient's care was started at 11:09 PM.  Chief Complaint  Patient presents with  . Knee Pain   Patient is a 53 y.o. male presenting with knee pain. The history is provided by the patient. No language interpreter was used.  Knee Pain Associated symptoms: no fever    HPI Comments:  Craig [name] is a 53 y.o. male with h/o HIV who presents to the Emergency Department complaining of left knee pain that started 3-4 weeks ago. He states he may have injured the knee then but is unsure. Pt reports the pain has been getting worse over the past two days. He states he was seen in [facility] and prescribed Prednisone that relieved his pain but states it has returned a couple of days ago. Pt reports working in [employer] and is on his feet for long periods at a time. He denies CP, SOB, fever or weakness of the knee or LLE. Denies any new injury, trauma or fall. Pt is on Coumadin for anticoagulation therapy for DVT and pulmonary embolism. Pt states his last INR was 5.8 five days ago.  Past Medical History  Diagnosis Date  . 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 Personal history of venous thrombosis and embolism   . Dysplasia of anus   . Esophageal reflux   . Horseshoe tear of retina without detachment 02-13-12  . 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  . Molluscum contagiosum 09-29-2012  . 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 infarction   . Other convulsions   . Unspecified hereditary and idiopathic peripheral neuropathy   . Secondary iridocyclitis, infectious   . 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 PE (pulmonary embolism) 04/09/2013    Overview:  Jan 2012   . 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 Hernia, inguinal 04/09/2013  . 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 Deep vein thrombosis 05/09/2011    Overview:  Jan 2012    History reviewed. No pertinent past surgical history. No family history on file. History  Substance Use Topics  . Smoking status: Never Smoker   . Smokeless tobacco: Never Used  . Alcohol Use: No    Review of Systems  Constitutional: Negative for fever.  Respiratory: Negative for shortness of breath.   Cardiovascular: Negative for chest pain.  Musculoskeletal: Positive for arthralgias and joint swelling.  Skin: Negative for color change.    Allergies  Latex; Shellfish allergy; and Penicillins  Home Medications   Prior to Admission medications   Medication Sig Start Date End Date Taking? Authorizing Provider  albuterol (PROVENTIL HFA;VENTOLIN HFA) 108 (90 BASE) MCG/ACT inhaler Inhale 2 puffs into the lungs every 6 (six) hours as needed for wheezing or shortness of breath.    Historical Provider, MD  beclomethasone (QVAR) 40 MCG/ACT inhaler Inhale 1 puff into the lungs 2 (two) times daily.    Historical Provider, MD  citalopram (CELEXA) 40 MG tablet Take 40 mg by mouth daily.    Historical Provider, MD  clobetasol cream (TEMOVATE) 0.05 % Apply 1 application topically 2 (two) times daily.    Historical Provider, MD  Darunavir Ethanolate (PREZISTA) 800 MG tablet Take 800 mg by mouth.    Historical Provider, MD  emtricitabine-tenofovir (TRUVADA) 200-300 MG per tablet Take 1 tablet by mouth daily.  Historical Provider, MD  folic acid (FOLVITE) 1 MG tablet Take 1 mg by mouth daily.    Historical Provider, MD  furosemide (LASIX) 40 MG tablet Take 40 mg by mouth.    Historical Provider, MD  hydrOXYzine (ATARAX/VISTARIL) 25 MG tablet Take 25 mg by mouth every 6 (six) hours as needed.    Historical Provider, MD  imiquimod (ALDARA) 5 % cream Apply topically 3 (three) times a week.    Historical Provider, MD  lisinopril (PRINIVIL,ZESTRIL) 20 MG tablet Take 1 tablet (20 mg total) by mouth daily.  03/08/14   Gust Rung, DO  methotrexate (RHEUMATREX) 2.5 MG tablet Take 2.5 mg by mouth once a week. Caution:Chemotherapy. Protect from light. Take 6 tablets ( ) once a week.    Historical Provider, MD  ranitidine (ZANTAC) 300 MG tablet Take 300 mg by mouth 2 (two) times daily.    Historical Provider, MD  ritonavir (NORVIR) 100 MG capsule Take 1 capsule (100 mg total) by mouth daily with breakfast. 05/10/14   Randall Hiss, MD  triamcinolone (KENALOG) 0.025 % ointment Apply 1 application topically 2 (two) times daily.    Historical Provider, MD  warfarin (COUMADIN) 5 MG tablet Take 5 mg by mouth daily. Take 1.5 tabs Monday, Friday and 10 mg ( 2 tabs) other days or as directed. Dr. Salli Quarry    Historical Provider, MD   Triage Vitals: BP 126/82  Pulse 106  Temp(Src) 99 F (37.2 C) (Oral)  Resp 14  Ht 6' (1.829 m)  Wt 236 lb (107.049 kg)  BMI 32.00 kg/m2  SpO2 96% Physical Exam  Nursing note and vitals reviewed. Constitutional: He is oriented to person, place, and time. He appears well-developed and well-nourished.  HENT:  Head: Normocephalic and atraumatic.  Eyes: EOM are normal.  Neck: Normal range of motion.  Cardiovascular: Normal rate, regular rhythm and normal heart sounds.   Pulses:      Dorsalis pedis pulses are 2+ on the right side.  Pulmonary/Chest: Effort normal and breath sounds normal.  Musculoskeletal: Normal range of motion.  Full passive ROM of left knee. No warmth, erythema, or edema of the right knee.  Neurological: He is alert and oriented to person, place, and time.  Skin: Skin is warm and dry.  Psychiatric: He has a normal mood and affect. His behavior is normal.    ED Course  Procedures (including critical care time) DIAGNOSTIC STUDIES: Oxygen Saturation is 96% on RA, adequate by my interpretation.   COORDINATION OF CARE: 11:19 PM- Will X-Ray left knee. Pt verbalizes understanding and agrees to plan.  Medications - No data to  display  Labs Review Labs Reviewed - No data to display  Imaging Review Dg Knee Complete 4 Views Left  09/11/2014   CLINICAL DATA:  Left distal patellar swelling. Numbness at the left lower leg.  EXAM: LEFT KNEE - COMPLETE 4+ VIEW  COMPARISON:  Left knee radiographs performed 08/01/2014  FINDINGS: There is no evidence of fracture or dislocation. The joint spaces are preserved. No significant degenerative change is seen; the patellofemoral joint is grossly unremarkable in appearance. An enthesophyte is seen arising at the upper pole of the patella.  Trace knee joint fluid remains within normal limits. The visualized soft tissues are normal in appearance.  IMPRESSION: No evidence of fracture or dislocation.   Electronically Signed   By: Roanna Raider M.D.   On: 09/11/2014 00:01     EKG Interpretation None     Filed  Vitals:   09/11/14 0022  BP: 111/72  Pulse: 96  Temp: 98.7 F (37.1 C)  Resp: 16    MDM   Final diagnoses:  None   Patient presenting with recurrent pain of the left knee pain.  No injury or trauma.  No signs of infection.  Patient afebrile.  Full ROM.  Therefore, doubt septic joint.  Patient given knee sleeve.   Patient stable for discharge.  Return precautions given.  I personally performed the services described in this documentation, which was scribed in my presence. The recorded information has been reviewed and is accurate.    Santiago Glad, PA-C 09/11/14 [phone]

## 2014-09-10 NOTE — ED Notes (Signed)
Pt. reports left knee pain with mild swelling onset 2 days ago , denies injury/ ambulatory .

## 2014-09-11 MED ORDER — TRAMADOL HCL 50 MG PO TABS: 50.0000 mg | ORAL_TABLET | Freq: Four times a day (QID) | ORAL | Status: DC | PRN

## 2014-09-11 NOTE — ED Provider Notes (Signed)
Medical screening examination/treatment/procedure(s) were performed by non-physician practitioner and as supervising physician I was immediately available for consultation/collaboration.  Megan Docherty, MD 09/11/14 1028 

## 2014-09-22 ENCOUNTER — Other Ambulatory Visit: Payer: Self-pay | Admitting: Internal Medicine

## 2014-09-29 ENCOUNTER — Encounter: Payer: Self-pay | Admitting: Internal Medicine

## 2014-10-07 ENCOUNTER — Other Ambulatory Visit: Payer: Medicare Other

## 2014-10-27 ENCOUNTER — Ambulatory Visit: Payer: Medicare Other | Admitting: Infectious Disease

## 2014-11-04 ENCOUNTER — Emergency Department (HOSPITAL_COMMUNITY)
Admission: EM | Admit: 2014-11-04 | Discharge: 2014-11-04 | Disposition: A | Discharge status: Home / Self Care | Payer: Medicare Other | Attending: Emergency Medicine | Admitting: Emergency Medicine

## 2014-11-04 ENCOUNTER — Encounter (HOSPITAL_COMMUNITY): Payer: Self-pay | Admitting: Emergency Medicine

## 2014-11-04 ENCOUNTER — Emergency Department (HOSPITAL_COMMUNITY): Payer: Medicare Other

## 2014-11-04 DIAGNOSIS — Z7901 Long term (current) use of anticoagulants: Secondary | ICD-10-CM | POA: Diagnosis not present

## 2014-11-04 DIAGNOSIS — Z86718 Personal history of other venous thrombosis and embolism: Secondary | ICD-10-CM | POA: Diagnosis not present

## 2014-11-04 DIAGNOSIS — Y998 Other external cause status: Secondary | ICD-10-CM | POA: Insufficient documentation

## 2014-11-04 DIAGNOSIS — Y929 Unspecified place or not applicable: Secondary | ICD-10-CM | POA: Insufficient documentation

## 2014-11-04 DIAGNOSIS — Z7952 Long term (current) use of systemic steroids: Secondary | ICD-10-CM | POA: Diagnosis not present

## 2014-11-04 DIAGNOSIS — I252 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 infarction: Secondary | ICD-10-CM | POA: Diagnosis not present

## 2014-11-04 DIAGNOSIS — Y939 Activity, unspecified: Secondary | ICD-10-CM | POA: Insufficient documentation

## 2014-11-04 DIAGNOSIS — X58XXXA Exposure to other specified factors, initial encounter: Secondary | ICD-10-CM | POA: Insufficient documentation

## 2014-11-04 DIAGNOSIS — K219 Gastro-esophageal reflux disease without esophagitis: Secondary | ICD-10-CM | POA: Diagnosis not present

## 2014-11-04 DIAGNOSIS — Z880 Allergy status to penicillin: Secondary | ICD-10-CM | POA: Diagnosis not present

## 2014-11-04 DIAGNOSIS — Z91040 Latex allergy status: Secondary | ICD-10-CM | POA: Diagnosis not present

## 2014-11-04 DIAGNOSIS — M79671 Pain in right foot: Secondary | ICD-10-CM | POA: Diagnosis present

## 2014-11-04 DIAGNOSIS — Z7951 Long term (current) use of inhaled steroids: Secondary | ICD-10-CM | POA: Diagnosis not present

## 2014-11-04 DIAGNOSIS — S93401A Sprain of unspecified ligament of right ankle, initial encounter: Secondary | ICD-10-CM | POA: Diagnosis not present

## 2014-11-04 DIAGNOSIS — Z86711 Personal history of pulmonary embolism: Secondary | ICD-10-CM | POA: Diagnosis not present

## 2014-11-04 DIAGNOSIS — Z79899 Other long term (current) drug therapy: Secondary | ICD-10-CM | POA: Diagnosis not present

## 2014-11-04 DIAGNOSIS — Z8619 Personal history of other infectious and parasitic diseases: Secondary | ICD-10-CM | POA: Diagnosis not present

## 2014-11-04 DIAGNOSIS — M25571 Pain in right ankle and joints of right foot: Secondary | ICD-10-CM

## 2014-11-04 MED ORDER — TRAMADOL HCL 50 MG PO TABS: 50.0000 mg | ORAL_TABLET | Freq: Four times a day (QID) | ORAL | Status: DC | PRN

## 2014-11-04 MED ORDER — HYDROCODONE-ACETAMINOPHEN 5-325 MG PO TABS
2.0000 | ORAL_TABLET | Freq: Once | ORAL | Status: AC
Start: 2014-11-04 — End: 2014-11-04
  Administered 2014-11-04: 2 via ORAL
  Filled 2014-11-04: qty 2

## 2014-11-04 NOTE — ED Provider Notes (Signed)
CSN: [id number]     Arrival date & time 11/04/14  1208 History  This chart is scribed for non-physician practitioner, Junius FinnerErin O'Malley, PA-C, working with Flint MelterElliott L Wentz, MD by Abel PrestoKara Demonbreun, ED Scribe.  This patient was seen in room TR10C/TR10C and the patient's care was started 1:24 PM.     Chief Complaint  Patient presents with  . Foot Pain      HPI  HPI Comments: Craig [name] is a 53 y.o. male who presents to the Emergency Department complaining of  9.5/10 achy pain in his right foot with pain radiating to his ankle with onset yesterday morning when he got up to go the bathroom. States he is unsure if he twisted his ankle but denies fall. Pt notes pain when ambulating, states that "it took him 3 hours to get dressed."  Pain is constant aching, 10/10 at worst.  Pt denies a fall.  Pt states he has not taken Tylenol or Motrin for relief because he is currently on Coumadin.    Past Medical History  Diagnosis Date  . 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 Personal history of venous thrombosis and embolism   . Dysplasia of anus   . Esophageal reflux   . Horseshoe tear of retina without detachment 02-13-12  . 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  . Molluscum contagiosum 09-29-2012  . 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 infarction   . Other convulsions   . Unspecified hereditary and idiopathic peripheral neuropathy   . Secondary iridocyclitis, infectious   . 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 PE (pulmonary embolism) 04/09/2013    Overview:  Jan 2012   . 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 Hernia, inguinal 04/09/2013  . 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 Deep vein thrombosis 05/09/2011    Overview:  Jan 2012    History reviewed. No pertinent past surgical history. History reviewed. No pertinent family history. History  Substance Use Topics  . Smoking status: Never Smoker   . Smokeless tobacco: Never Used  . Alcohol Use: No    Review of Systems   Musculoskeletal: Positive for myalgias, joint swelling and arthralgias.       Right ankle and foot  Skin: Negative for color change and wound.  All other systems reviewed and are negative.     Allergies  Latex; Shellfish allergy; and Penicillins  Home Medications   Prior to Admission medications   Medication Sig Start Date End Date Taking? Authorizing Provider  albuterol (PROVENTIL HFA;VENTOLIN HFA) 108 (90 BASE) MCG/ACT inhaler Inhale 2 puffs into the lungs every 6 (six) hours as needed for wheezing or shortness of breath.    Historical Provider, MD  beclomethasone (QVAR) 40 MCG/ACT inhaler Inhale 1 puff into the lungs 2 (two) times daily.    Historical Provider, MD  citalopram (CELEXA) 40 MG tablet Take 40 mg by mouth daily.    Historical Provider, MD  clobetasol cream (TEMOVATE) 0.05 % Apply 1 application topically 2 (two) times daily.    Historical Provider, MD  Darunavir Ethanolate (PREZISTA) 800 MG tablet Take 800 mg by mouth.    Historical Provider, MD  emtricitabine-tenofovir (TRUVADA) 200-300 MG per tablet Take 1 tablet by mouth daily.    Historical Provider, MD  folic acid (FOLVITE) 1 MG tablet Take 1 mg by mouth daily.    Historical Provider, MD  furosemide (LASIX) 40 MG tablet Take 40 mg by mouth.    Historical Provider, MD  hydrOXYzine (ATARAX/VISTARIL) 25 MG tablet Take 25 mg by mouth every 6 (  six) hours as needed.    Historical Provider, MD  imiquimod (ALDARA) 5 % cream Apply topically 3 (three) times a week.    Historical Provider, MD  lisinopril (PRINIVIL,ZESTRIL) 20 MG tablet Take 1 tablet (20 mg total) by mouth daily. 03/08/14   Gust Rung, DO  methotrexate (RHEUMATREX) 2.5 MG tablet Take 2.5 mg by mouth once a week. Caution:Chemotherapy. Protect from light. Take 6 tablets ( 15mg ) once a week.    Historical Provider, MD  ranitidine (ZANTAC) 300 MG tablet Take 300 mg by mouth 2 (two) times daily.    Historical Provider, MD  ritonavir (NORVIR) 100 MG capsule Take 1  capsule (100 mg total) by mouth daily with breakfast. 05/10/14   Randall Hiss, MD  traMADol (ULTRAM) 50 MG tablet Take 1 tablet (50 mg total) by mouth every 6 (six) hours as needed. 11/04/14   Junius Finner, PA-C  triamcinolone (KENALOG) 0.025 % ointment Apply 1 application topically 2 (two) times daily.    Historical Provider, MD  warfarin (COUMADIN) 5 MG tablet Take 5 mg by mouth daily. Take 1.5 tabs Monday, Friday and 10 mg ( 2 tabs) other days or as directed. Dr. Salli Quarry    Historical Provider, MD   BP 101/64 mmHg  Pulse 105  Resp 16  SpO2 99% Physical Exam  Constitutional: He is oriented to person, place, and time. He appears well-developed and well-nourished.  HENT:  Head: Normocephalic and atraumatic.  Eyes: EOM are normal.  Neck: Normal range of motion.  Cardiovascular: Normal rate.   Pulses:      Dorsalis pedis pulses are 2+ on the right side.       Posterior tibial pulses are 2+ on the right side.  Pulmonary/Chest: Effort normal.  Musculoskeletal: Normal range of motion.  Right ankle moderate edema over lateral malleolus  Tenderness to dorsum of foot  Decreased ROM of right ankle Toes non-tender no edema or erythema Thickened yellow nails   Neurological: He is alert and oriented to person, place, and time.  Skin: Skin is warm and dry. No erythema.   No ecchymosis or erythema  Psychiatric: He has a normal mood and affect. His behavior is normal.  Nursing note and vitals reviewed.   ED Course  Procedures (including critical care time) DIAGNOSTIC STUDIES: Oxygen Saturation is % on RA, normal by my interpretation.    COORDINATION OF CARE: 1:26 PM Discussed treatment plan with patient at beside, the patient agrees with the plan and has no further questions at this time.   Labs Review Labs Reviewed - No data to display  Imaging Review Dg Ankle Complete Right  11/04/2014   CLINICAL DATA:  Medial foot pain for 2 days with a head injury, initial encounter   EXAM: RIGHT ANKLE - COMPLETE 3+ VIEW  COMPARISON:  None.  FINDINGS: There is no evidence of fracture, dislocation, or joint effusion. No soft tissue abnormality is noted. Small calcaneal spurs are seen.  IMPRESSION: No acute abnormality noted.   Electronically Signed   By: Alcide Clever M.D.   On: 11/04/2014 14:27   Dg Foot Complete Right  11/04/2014   CLINICAL DATA:  Right medial foot pain, no injury, initial encounter  EXAM: RIGHT FOOT COMPLETE - 3+ VIEW  COMPARISON:  None.  FINDINGS: There is no evidence of fracture or dislocation. No soft tissue changes are seen. Small calcaneal spurs are again noted.  IMPRESSION: No acute abnormality noted.   Electronically Signed   By: Loraine Leriche  Lukens M.D.   On: 11/04/2014 14:28     EKG Interpretation None      MDM   Final diagnoses:  Right ankle pain  Right foot pain  Right ankle sprain, initial encounter   Pt is a 53yo male c/o right ankle and foot pain with swelling x2 days. No evidence of underlying infection. Plain films: no acute abnormality.   I personally performed the services described in this documentation, which was scribed in my presence. The recorded information has been reviewed and is accurate.    Junius Finnerrin O'Malley, PA-C 11/04/14 1458  Flint MelterElliott L Wentz, MD 11/04/14 [phone]

## 2014-11-04 NOTE — ED Notes (Signed)
Pt c/o right foot pain x 2 days; pt denies injury but sts hx of gout and unsure if could be same

## 2014-11-08 ENCOUNTER — Emergency Department (HOSPITAL_COMMUNITY)
Admission: EM | Admit: 2014-11-08 | Discharge: 2014-11-09 | Disposition: A | Discharge status: Home / Self Care | Payer: Medicare Other | Attending: Emergency Medicine | Admitting: Emergency Medicine

## 2014-11-08 ENCOUNTER — Encounter (HOSPITAL_COMMUNITY): Payer: Self-pay | Admitting: *Deleted

## 2014-11-08 DIAGNOSIS — Z8619 Personal history of other infectious and parasitic diseases: Secondary | ICD-10-CM | POA: Insufficient documentation

## 2014-11-08 DIAGNOSIS — Z880 Allergy status to penicillin: Secondary | ICD-10-CM | POA: Insufficient documentation

## 2014-11-08 DIAGNOSIS — Z8669 Personal history of other diseases of the nervous system and sense organs: Secondary | ICD-10-CM | POA: Insufficient documentation

## 2014-11-08 DIAGNOSIS — I252 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 infarction: Secondary | ICD-10-CM | POA: Insufficient documentation

## 2014-11-08 DIAGNOSIS — Z8719 Personal history of other diseases of the digestive system: Secondary | ICD-10-CM | POA: Diagnosis not present

## 2014-11-08 DIAGNOSIS — M109 Gout, unspecified: Secondary | ICD-10-CM | POA: Diagnosis not present

## 2014-11-08 DIAGNOSIS — Z86718 Personal history of other venous thrombosis and embolism: Secondary | ICD-10-CM | POA: Diagnosis not present

## 2014-11-08 DIAGNOSIS — Z7901 Long term (current) use of anticoagulants: Secondary | ICD-10-CM | POA: Diagnosis not present

## 2014-11-08 DIAGNOSIS — Z91040 Latex allergy status: Secondary | ICD-10-CM | POA: Diagnosis not present

## 2014-11-08 DIAGNOSIS — Z79899 Other long term (current) drug therapy: Secondary | ICD-10-CM | POA: Insufficient documentation

## 2014-11-08 DIAGNOSIS — Z86711 Personal history of pulmonary embolism: Secondary | ICD-10-CM | POA: Diagnosis not present

## 2014-11-08 DIAGNOSIS — Z7951 Long term (current) use of inhaled steroids: Secondary | ICD-10-CM | POA: Diagnosis not present

## 2014-11-08 DIAGNOSIS — M79604 Pain in right leg: Secondary | ICD-10-CM | POA: Diagnosis present

## 2014-11-08 LAB — BASIC METABOLIC PANEL
Anion gap: 15 (ref 5–15)
BUN: 9 mg/dL (ref 6–23)
CALCIUM: 9.9 mg/dL (ref 8.4–10.5)
CO2: 24 mEq/L (ref 19–32)
CREATININE: 1.2 mg/dL (ref 0.50–1.35)
Chloride: 98 mEq/L (ref 96–112)
GFR calc Af Amer: 79 mL/min — ABNORMAL LOW (ref >90)
GFR, EST NON AFRICAN AMERICAN: 68 mL/min — AB (ref >90)
GLUCOSE: 84 mg/dL (ref 70–99)
POTASSIUM: 4.4 meq/L (ref 3.7–5.3)
Sodium: 137 mEq/L (ref 137–147)

## 2014-11-08 LAB — PROTIME-INR
INR: 3.07 — ABNORMAL HIGH (ref 0.00–1.49)
Prothrombin Time: 31.9 seconds — ABNORMAL HIGH (ref 11.6–15.2)

## 2014-11-08 LAB — CBC
HEMATOCRIT: 44.1 % (ref 39.0–52.0)
Hemoglobin: 14.6 g/dL (ref 13.0–17.0)
MCH: 29.9 pg (ref 26.0–34.0)
MCHC: 33.1 g/dL (ref 30.0–36.0)
MCV: 90.2 fL (ref 78.0–100.0)
Platelets: 258 10*3/uL (ref 150–400)
RBC: 4.89 MIL/uL (ref 4.22–5.81)
RDW: 15.1 % (ref 11.5–15.5)
WBC: 5.7 10*3/uL (ref 4.0–10.5)

## 2014-11-08 NOTE — ED Notes (Signed)
Pt in c/o right lower leg pain, states it started as ankle pain and was seen on 11/12 for that, since that time pain now radiates into his knee and area is hot to touch, pt has a history of DVT in the past and is currently on blood thinners

## 2014-11-09 DIAGNOSIS — M109 Gout, unspecified: Secondary | ICD-10-CM | POA: Diagnosis not present

## 2014-11-09 MED ORDER — OXYCODONE-ACETAMINOPHEN 5-325 MG PO TABS: 2.0000 | ORAL_TABLET | Freq: Four times a day (QID) | ORAL | Status: DC | PRN

## 2014-11-09 MED ORDER — PREDNISONE 20 MG PO TABS: 40.0000 mg | ORAL_TABLET | Freq: Every day | ORAL | Status: DC

## 2014-11-09 NOTE — Discharge Instructions (Signed)

## 2014-11-09 NOTE — ED Notes (Signed)
Contacted ortho for cam walker.

## 2014-11-09 NOTE — ED Provider Notes (Signed)
CSN: [id number]     Arrival date & time 11/08/14  2049 History   First MD Initiated Contact with Patient 11/08/14 2336     Chief Complaint  Patient presents with  . Leg Pain     (Consider location/radiation/quality/duration/timing/severity/associated sxs/prior Treatment) HPI Comments: Patient presents to the emergency department with chief complaint of right leg pain. He states that the pain is in his ankle and in his knee. Patient was recently seen and diagnosed with ankle sprain. He is also currently taking Coumadin for DVT. His INR level is therapeutic. Patient reports increased warmth to the ankle. He does have a history of gout. He states this feels similar. It is very tender to palpation. Pain is worsened with movement and palpation. He denies any fevers, chills, chest pain, shortness of breath.  The history is provided by the patient. No language interpreter was used.    Past Medical History  Diagnosis Date  . 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 Personal history of venous thrombosis and embolism   . Dysplasia of anus   . Esophageal reflux   . Horseshoe tear of retina without detachment 02-13-12  . 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  . Molluscum contagiosum 09-29-2012  . 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 infarction   . Other convulsions   . Unspecified hereditary and idiopathic peripheral neuropathy   . Secondary iridocyclitis, infectious   . 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 PE (pulmonary embolism) 04/09/2013    Overview:  Jan 2012   . 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 Hernia, inguinal 04/09/2013  . 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 Deep vein thrombosis 05/09/2011    Overview:  Jan 2012    History reviewed. No pertinent past surgical history. History reviewed. No pertinent family history. History  Substance Use Topics  . Smoking status: Never Smoker   . Smokeless tobacco: Never Used  . Alcohol Use: No    Review of Systems   Constitutional: Negative for fever and chills.  Respiratory: Negative for shortness of breath.   Cardiovascular: Negative for chest pain.  Gastrointestinal: Negative for nausea, vomiting, diarrhea and constipation.  Genitourinary: Negative for dysuria.  Musculoskeletal: Positive for arthralgias.  Skin: Positive for color change.  All other systems reviewed and are negative.     Allergies  Shellfish allergy; Penicillins; and Latex  Home Medications   Prior to Admission medications   Medication Sig Start Date End Date Taking? Authorizing Provider  albuterol (PROVENTIL HFA;VENTOLIN HFA) 108 (90 BASE) MCG/ACT inhaler Inhale 2 puffs into the lungs every 6 (six) hours as needed for wheezing or shortness of breath.   Yes Historical Provider, MD  beclomethasone (QVAR) 40 MCG/ACT inhaler Inhale 1 puff into the lungs 2 (two) times daily as needed (shortness of breath).    Yes Historical Provider, MD  citalopram (CELEXA) 40 MG tablet Take 40 mg by mouth daily.   Yes Historical Provider, MD  clobetasol cream (TEMOVATE) 0.05 % Apply 1 application topically 2 (two) times daily.   Yes Historical Provider, MD  dolutegravir (TIVICAY) 50 MG tablet Take 50 mg by mouth daily.   Yes Historical Provider, MD  emtricitabine-tenofovir (TRUVADA) 200-300 MG per tablet Take 1 tablet by mouth daily.   Yes Historical Provider, MD  folic acid (FOLVITE) 1 MG tablet Take 1 mg by mouth every other day.    Yes Historical Provider, MD  furosemide (LASIX) 40 MG tablet Take 40 mg by mouth every other day.    Yes Historical Provider, MD  hydrOXYzine (ATARAX/VISTARIL) 25  MG tablet Take 25 mg by mouth every 6 (six) hours as needed for anxiety.    Yes Historical Provider, MD  imiquimod (ALDARA) 5 % cream Apply topically 3 (three) times a week.   Yes Historical Provider, MD  lisinopril (PRINIVIL,ZESTRIL) 20 MG tablet Take 1 tablet (20 mg total) by mouth daily. 03/08/14  Yes Gust Rung, DO  methotrexate (RHEUMATREX) 2.5 MG  tablet Take 2.5 mg by mouth once a week. Caution:Chemotherapy. Protect from light. Take 6 tablets ( 15mg ) once a week.   Yes Historical Provider, MD  traMADol (ULTRAM) 50 MG tablet Take 1 tablet (50 mg total) by mouth every 6 (six) hours as needed. Patient taking differently: Take 50 mg by mouth every 6 (six) hours as needed for moderate pain.  11/04/14  Yes Junius Finner, PA-C  triamcinolone (KENALOG) 0.025 % ointment Apply 1 application topically 2 (two) times daily.   Yes Historical Provider, MD  warfarin (COUMADIN) 5 MG tablet Take 7.5 mg by mouth daily.    Yes Historical Provider, MD  Darunavir Ethanolate (PREZISTA) 800 MG tablet Take 800 mg by mouth daily.     Historical Provider, MD  ranitidine (ZANTAC) 300 MG tablet Take 300 mg by mouth 2 (two) times daily.    Historical Provider, MD  ritonavir (NORVIR) 100 MG capsule Take 1 capsule (100 mg total) by mouth daily with breakfast. 05/10/14   Randall Hiss, MD   BP 121/68 mmHg  Pulse 94  Temp(Src) 99.1 F (37.3 C)  Resp 20  Ht 6' (1.829 m)  Wt 225 lb (102.059 kg)  BMI 30.51 kg/m2  SpO2 99% Physical Exam  Constitutional: He is oriented to person, place, and time. He appears well-developed and well-nourished.  HENT:  Head: Normocephalic and atraumatic.  Eyes: Conjunctivae and EOM are normal. Pupils are equal, round, and reactive to light. Right eye exhibits no discharge. Left eye exhibits no discharge. No scleral icterus.  Neck: Normal range of motion. Neck supple. No JVD present.  Cardiovascular: Normal rate, regular rhythm and normal heart sounds.  Exam reveals no gallop and no friction rub.   No murmur heard. Pulmonary/Chest: Effort normal and breath sounds normal. No respiratory distress. He has no wheezes. He has no rales. He exhibits no tenderness.  Abdominal: Soft. He exhibits no distension and no mass. There is no tenderness. There is no rebound and no guarding.  Musculoskeletal: Normal range of motion. He exhibits no  edema or tenderness.  Right ankle moderately swollen, no bony abnormality or deformity  No posterior calf tenderness  Neurological: He is alert and oriented to person, place, and time.  Skin: Skin is warm and dry.  Right ankle mildly erythematous, but no streaking, no abscess, no signs of cellulitis  Psychiatric: He has a normal mood and affect. His behavior is normal. Judgment and thought content normal.  Nursing note and vitals reviewed.   ED Course  Procedures (including critical care time) Labs Review Labs Reviewed  PROTIME-INR - Abnormal; Notable for the following:    Prothrombin Time 31.9 (*)    INR 3.07 (*)    All other components within normal limits  BASIC METABOLIC PANEL - Abnormal; Notable for the following:    GFR calc non Af Amer 68 (*)    GFR calc Af Amer 79 (*)    All other components within normal limits  CBC    Imaging Review No results found.   EKG Interpretation None      MDM  Final diagnoses:  Gout of ankle, unspecified cause, unspecified chronicity, unspecified laterality    Patient with right ankle and knee pain. History of gout. Also has a history of DVT, but is therapeutic on INR. No chest pain or shortness of breath. Patient seen by and discussed with Dr. Norlene Campbelltter, who recommends treatment for gout. I discussed with pharmacy regarding colchicine as the patient is taking warfarin. Pharmacy recommends that the patient have his INR checked on Friday. Will discharge with prednisone, colchicine, and pain medication. Patient understands and agrees with the plan. He is stable and ready for discharge.  12:44 AM Will not give colchicine because of drug interaction with the patient's HIV anti-virals.  Roxy Horsemanobert Odell Fasching, PA-C 11/09/14 [id number]  Olivia Mackielga M Otter, MD 11/09/14 [phone]

## 2014-11-09 NOTE — Progress Notes (Signed)
Orthopedic Tech Progress Note Patient Details:  Craig [name] 12-02-1961 [id number]  Ortho Devices Type of Ortho Device: CAM walker Ortho Device/Splint Interventions: Application   [name], [name] M 11/09/2014, 12:32 AM

## 2014-11-10 ENCOUNTER — Other Ambulatory Visit (HOSPITAL_COMMUNITY)
Admission: RE | Admit: 2014-11-10 | Discharge: 2014-11-10 | Disposition: A | Discharge status: Home / Self Care | Payer: Medicare Other | Source: Ambulatory Visit | Attending: Infectious Disease | Admitting: Infectious Disease

## 2014-11-10 ENCOUNTER — Other Ambulatory Visit: Payer: Medicare Other

## 2014-11-10 DIAGNOSIS — B20 Human immunodeficiency virus [HIV] disease: Secondary | ICD-10-CM

## 2014-11-10 DIAGNOSIS — Z113 Encounter for screening for infections with a predominantly sexual mode of transmission: Secondary | ICD-10-CM

## 2014-11-10 DIAGNOSIS — Z21 Asymptomatic human immunodeficiency virus [HIV] infection status: Secondary | ICD-10-CM

## 2014-11-10 LAB — CBC WITH DIFFERENTIAL/PLATELET
BASOS ABS: 0 10*3/uL (ref 0.0–0.1)
BASOS PCT: 0 % (ref 0–1)
Eosinophils Absolute: 0 10*3/uL (ref 0.0–0.7)
Eosinophils Relative: 0 % (ref 0–5)
HCT: 42.1 % (ref 39.0–52.0)
Hemoglobin: 14.1 g/dL (ref 13.0–17.0)
LYMPHS PCT: 6 % — AB (ref 12–46)
Lymphs Abs: 0.5 10*3/uL — ABNORMAL LOW (ref 0.7–4.0)
MCH: 30.1 pg (ref 26.0–34.0)
MCHC: 33.5 g/dL (ref 30.0–36.0)
MCV: 89.8 fL (ref 78.0–100.0)
MPV: 9.6 fL (ref 9.4–12.4)
Monocytes Absolute: 0.2 10*3/uL (ref 0.1–1.0)
Monocytes Relative: 3 % (ref 3–12)
Neutro Abs: 7.2 10*3/uL (ref 1.7–7.7)
Neutrophils Relative %: 91 % — ABNORMAL HIGH (ref 43–77)
PLATELETS: 281 10*3/uL (ref 150–400)
RBC: 4.69 MIL/uL (ref 4.22–5.81)
RDW: 15.7 % — AB (ref 11.5–15.5)
WBC: 7.9 10*3/uL (ref 4.0–10.5)

## 2014-11-11 LAB — COMPLETE METABOLIC PANEL WITH GFR
ALBUMIN: 4 g/dL (ref 3.5–5.2)
ALK PHOS: 69 U/L (ref 39–117)
ALT: 29 U/L (ref 0–53)
AST: 28 U/L (ref 0–37)
BUN: 8 mg/dL (ref 6–23)
CO2: 27 mEq/L (ref 19–32)
Calcium: 9.2 mg/dL (ref 8.4–10.5)
Chloride: 101 mEq/L (ref 96–112)
Creat: 1.2 mg/dL (ref 0.50–1.35)
GFR, Est African American: 80 mL/min
GFR, Est Non African American: 70 mL/min
Glucose, Bld: 83 mg/dL (ref 70–99)
POTASSIUM: 4.3 meq/L (ref 3.5–5.3)
Sodium: 137 mEq/L (ref 135–145)
TOTAL PROTEIN: 7.9 g/dL (ref 6.0–8.3)
Total Bilirubin: 0.4 mg/dL (ref 0.2–1.2)

## 2014-11-11 LAB — T-HELPER CELL (CD4) - (RCID CLINIC ONLY)
CD4 T CELL HELPER: 31 % — AB (ref 33–55)
CD4 T Cell Abs: 150 /uL — ABNORMAL LOW (ref 400–2700)

## 2014-11-11 LAB — RPR

## 2014-11-12 LAB — URINE CYTOLOGY ANCILLARY ONLY
Chlamydia: NEGATIVE
Neisseria Gonorrhea: NEGATIVE

## 2014-11-12 LAB — HIV-1 RNA QUANT-NO REFLEX-BLD
HIV 1 RNA Quant: <20 copies/mL (ref <20)
HIV-1 RNA Quant, Log: <1.3 {Log} (ref <1.30)

## 2014-11-22 ENCOUNTER — Ambulatory Visit (INDEPENDENT_AMBULATORY_CARE_PROVIDER_SITE_OTHER): Payer: Medicare Other | Admitting: Infectious Disease

## 2014-11-22 ENCOUNTER — Encounter: Payer: Self-pay | Admitting: Infectious Disease

## 2014-11-22 VITALS — BP 123/83 | HR 99 | Temp 98.0°F | Wt 232.0 lb

## 2014-11-22 DIAGNOSIS — E785 Hyperlipidemia, unspecified: Secondary | ICD-10-CM

## 2014-11-22 DIAGNOSIS — L309 Dermatitis, unspecified: Secondary | ICD-10-CM

## 2014-11-22 DIAGNOSIS — B20 Human immunodeficiency virus [HIV] disease: Secondary | ICD-10-CM

## 2014-11-22 DIAGNOSIS — Z86711 Personal history of pulmonary embolism: Secondary | ICD-10-CM

## 2014-11-22 DIAGNOSIS — I2510 Atherosclerotic heart disease of native coronary artery without angina pectoris: Secondary | ICD-10-CM

## 2014-11-22 DIAGNOSIS — I9589 Other hypotension: Secondary | ICD-10-CM

## 2014-11-22 DIAGNOSIS — M1000 Idiopathic gout, unspecified site: Secondary | ICD-10-CM

## 2014-11-22 DIAGNOSIS — I959 Hypotension, unspecified: Secondary | ICD-10-CM | POA: Insufficient documentation

## 2014-11-22 DIAGNOSIS — Z21 Asymptomatic human immunodeficiency virus [HIV] infection status: Secondary | ICD-10-CM

## 2014-11-22 DIAGNOSIS — Z86718 Personal history of other venous thrombosis and embolism: Secondary | ICD-10-CM

## 2014-11-22 LAB — URIC ACID: URIC ACID, SERUM: 8.4 mg/dL — AB (ref 4.0–7.8)

## 2014-11-22 MED ORDER — DOLUTEGRAVIR SODIUM 50 MG PO TABS: 50.0000 mg | ORAL_TABLET | Freq: Every day | ORAL | Status: DC

## 2014-11-22 MED ORDER — EMTRICITABINE-TENOFOVIR DF 200-300 MG PO TABS: 1.0000 | ORAL_TABLET | Freq: Every day | ORAL | Status: DC

## 2014-11-22 NOTE — Progress Notes (Signed)
Subjective:    Patient ID: Craig [name], male    DOB: 1961-04-25, 53 y.o.   MRN: [id number]  HPI   53 year old African American male with HIV, multiple medical problems including eczema (on MTX), DVT with PE, dCHF, HTN, ? MI, prior AKI previously [facility] he has been receiving his care.  I was able in fact to read my note on him from when I was his [facility] provider [facility]. He had been on a regimen of videx, Emtriva, and Reyataz 400 mg and was perfectly suppressed then was changed over to Reyataz, Norvir and truvada with apparent perfect virological suppression then change to n PRezista, Norvir and Truvada.   Since I last saw him he has had his ARV regimen changed by Dr. Cardell PeachGay who saw him and changed him to Tivicay and Truvada to avoid Norvir interaction with advair that he was being rx and to simplify his regimen.   He has maintained perfect virological suppression since then.  Lab Results  Component Value Date   HIV1RNAQUANT <20 11/10/2014   Though his CD4 had dropped  Lab Results  Component Value Date   CD4TABS 150* 11/10/2014   CD4TABS 580 05/10/2014   CD4TABS 500 02/25/2014   In the past few weeks he was seen in the ED and diagnosed with gout and rx with prednisone. Colchicine was not started due to drug drug interactions with Norvir (which was still active in our system)but which he was not on. [facility] with [facility] syncope after having been given lasix. His lasix has been since DC and his hypotension resolved with NS bolus.    Review of Systems  Constitutional: Negative for fever, chills, diaphoresis, activity change, appetite change, fatigue and unexpected weight change.  HENT: Negative for congestion, rhinorrhea, sinus pressure, sneezing, sore throat and trouble swallowing.   Eyes: Negative for photophobia and visual disturbance.  Respiratory: Negative for cough, chest tightness, shortness of breath, wheezing and stridor.     Cardiovascular: Negative for chest pain, palpitations and leg swelling.  Gastrointestinal: Negative for nausea, vomiting, abdominal pain, diarrhea, constipation, blood in stool, abdominal distention and anal bleeding.  Genitourinary: Negative for dysuria, hematuria, flank pain and difficulty urinating.  Musculoskeletal: Positive for myalgias. Negative for back pain, joint swelling, arthralgias and gait problem.  Skin: Negative for color change, pallor, rash and wound.  Neurological: Negative for dizziness, tremors, weakness and light-headedness.  Hematological: Negative for adenopathy. Does not bruise/bleed easily.  Psychiatric/Behavioral: Negative for behavioral problems, confusion, sleep disturbance, dysphoric mood, decreased concentration and agitation.       Objective:   Physical Exam  Constitutional: He is oriented to person, place, and time. He appears well-developed and well-nourished. No distress.  HENT:  Head: Normocephalic and atraumatic.  Mouth/Throat: Oropharynx is clear and moist. No oropharyngeal exudate.  Eyes: Conjunctivae and EOM are normal. No scleral icterus.  Neck: Normal range of motion. Neck supple. No JVD present.  Cardiovascular: Normal rate and regular rhythm.   Pulmonary/Chest: Effort normal and breath sounds normal. No respiratory distress.  Abdominal: Soft. He exhibits no distension.  Musculoskeletal: He exhibits edema. He exhibits no tenderness.  Lymphadenopathy:    He has no cervical adenopathy.  Neurological: He is alert and oriented to person, place, and time. He exhibits normal muscle tone. Coordination normal.  Skin: Skin is warm and dry. He is not diaphoretic. No erythema. No pallor.  Psychiatric: He has a normal mood and  affect. His behavior is normal. Judgment and thought content normal.          Assessment & Plan:   #1 HIV:   The simplification to Tivicay and Truvada is perfectly reasonable and avoids the drug drug interaction problems of  Norvir or COBI  Would be good idea to change him TAF-based Truvada in the Spring   I spent greater than 40 minutes with the patient including greater than 50% of time in face to face counsel of the patient and in coordination of their care.  #2 Hypotension: was apparently due to lasix. I wonder if he could have had problems with AI with advair and norvir being in the picture.  #3 Gout: SINCE there is not interaction with his ARVs I would strongly support giving him colchicine and then when time is appropriate allopurinol but will defer this to PCP I will check a uric acid level today  #2 DVT, PE: he will continue to be  [facility]  #3 Eczema: on MTX with folate by Dermatology. MTX may have some beneficial effects on inflammation due to HIV reservoir as well. Wonder if [place] could [place]?

## 2014-11-23 LAB — HIV-1 RNA QUANT-NO REFLEX-BLD: HIV 1 RNA Quant: <20 copies/mL (ref <20)

## 2014-11-24 LAB — T-HELPER CELL (CD4) - (RCID CLINIC ONLY)
CD4 % Helper T Cell: 39 % (ref 33–55)
CD4 T CELL ABS: 430 /uL (ref 400–2700)

## 2014-11-27 ENCOUNTER — Encounter (HOSPITAL_COMMUNITY): Payer: Self-pay | Admitting: *Deleted

## 2014-11-27 ENCOUNTER — Emergency Department (HOSPITAL_COMMUNITY): Payer: Medicare Other

## 2014-11-27 ENCOUNTER — Emergency Department (HOSPITAL_COMMUNITY)
Admission: EM | Admit: 2014-11-27 | Discharge: 2014-11-27 | Disposition: A | Discharge status: Home / Self Care | Payer: Medicare Other | Attending: Emergency Medicine | Admitting: Emergency Medicine

## 2014-11-27 DIAGNOSIS — Z79899 Other long term (current) drug therapy: Secondary | ICD-10-CM | POA: Diagnosis not present

## 2014-11-27 DIAGNOSIS — I252 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 infarction: Secondary | ICD-10-CM | POA: Diagnosis not present

## 2014-11-27 DIAGNOSIS — Z880 Allergy status to penicillin: Secondary | ICD-10-CM | POA: Diagnosis not present

## 2014-11-27 DIAGNOSIS — Z8619 Personal history of other infectious and parasitic diseases: Secondary | ICD-10-CM | POA: Insufficient documentation

## 2014-11-27 DIAGNOSIS — M79672 Pain in left foot: Secondary | ICD-10-CM | POA: Diagnosis present

## 2014-11-27 DIAGNOSIS — Z8719 Personal history of other diseases of the digestive system: Secondary | ICD-10-CM | POA: Diagnosis not present

## 2014-11-27 DIAGNOSIS — Z91040 Latex allergy status: Secondary | ICD-10-CM | POA: Insufficient documentation

## 2014-11-27 DIAGNOSIS — Z8669 Personal history of other diseases of the nervous system and sense organs: Secondary | ICD-10-CM | POA: Diagnosis not present

## 2014-11-27 DIAGNOSIS — Z7901 Long term (current) use of anticoagulants: Secondary | ICD-10-CM | POA: Diagnosis not present

## 2014-11-27 DIAGNOSIS — Z86711 Personal history of pulmonary embolism: Secondary | ICD-10-CM | POA: Diagnosis not present

## 2014-11-27 DIAGNOSIS — M109 Gout, unspecified: Secondary | ICD-10-CM | POA: Insufficient documentation

## 2014-11-27 DIAGNOSIS — Z86718 Personal history of other venous thrombosis and embolism: Secondary | ICD-10-CM | POA: Diagnosis not present

## 2014-11-27 DIAGNOSIS — M79673 Pain in unspecified foot: Secondary | ICD-10-CM

## 2014-11-27 DIAGNOSIS — Z7951 Long term (current) use of inhaled steroids: Secondary | ICD-10-CM | POA: Insufficient documentation

## 2014-11-27 LAB — COMPREHENSIVE METABOLIC PANEL
ALT: 28 U/L (ref 0–53)
AST: 26 U/L (ref 0–37)
Albumin: 3.4 g/dL — ABNORMAL LOW (ref 3.5–5.2)
Alkaline Phosphatase: 66 U/L (ref 39–117)
Anion gap: 16 — ABNORMAL HIGH (ref 5–15)
BUN: 9 mg/dL (ref 6–23)
CO2: 22 meq/L (ref 19–32)
Calcium: 9.5 mg/dL (ref 8.4–10.5)
Chloride: 100 mEq/L (ref 96–112)
Creatinine, Ser: 1.2 mg/dL (ref 0.50–1.35)
GFR calc Af Amer: 79 mL/min — ABNORMAL LOW (ref >90)
GFR, EST NON AFRICAN AMERICAN: 68 mL/min — AB (ref >90)
Glucose, Bld: 87 mg/dL (ref 70–99)
POTASSIUM: 4 meq/L (ref 3.7–5.3)
SODIUM: 138 meq/L (ref 137–147)
Total Bilirubin: 0.4 mg/dL (ref 0.3–1.2)
Total Protein: 7.8 g/dL (ref 6.0–8.3)

## 2014-11-27 LAB — PROTIME-INR
INR: 2.65 — ABNORMAL HIGH (ref 0.00–1.49)
Prothrombin Time: 28.4 seconds — ABNORMAL HIGH (ref 11.6–15.2)

## 2014-11-27 LAB — CBC WITH DIFFERENTIAL/PLATELET
Basophils Absolute: 0 10*3/uL (ref 0.0–0.1)
Basophils Relative: 1 % (ref 0–1)
Eosinophils Absolute: 0.1 10*3/uL (ref 0.0–0.7)
Eosinophils Relative: 1 % (ref 0–5)
HCT: 38.8 % — ABNORMAL LOW (ref 39.0–52.0)
Hemoglobin: 12.7 g/dL — ABNORMAL LOW (ref 13.0–17.0)
Lymphocytes Relative: 21 % (ref 12–46)
Lymphs Abs: 1.4 10*3/uL (ref 0.7–4.0)
MCH: 29.5 pg (ref 26.0–34.0)
MCHC: 32.7 g/dL (ref 30.0–36.0)
MCV: 90.2 fL (ref 78.0–100.0)
Monocytes Absolute: 0.6 10*3/uL (ref 0.1–1.0)
Monocytes Relative: 9 % (ref 3–12)
Neutro Abs: 4.4 10*3/uL (ref 1.7–7.7)
Neutrophils Relative %: 68 % (ref 43–77)
PLATELETS: 261 10*3/uL (ref 150–400)
RBC: 4.3 MIL/uL (ref 4.22–5.81)
RDW: 14.7 % (ref 11.5–15.5)
WBC: 6.4 10*3/uL (ref 4.0–10.5)

## 2014-11-27 MED ORDER — OXYCODONE-ACETAMINOPHEN 5-325 MG PO TABS: 1.0000 | ORAL_TABLET | Freq: Four times a day (QID) | ORAL | Status: DC | PRN

## 2014-11-27 MED ORDER — ONDANSETRON 4 MG PO TBDP
4.0000 mg | ORAL_TABLET | Freq: Once | ORAL | Status: AC
  Administered 2014-11-27: 4 mg via ORAL
  Filled 2014-11-27: qty 1

## 2014-11-27 MED ORDER — OXYCODONE-ACETAMINOPHEN 5-325 MG PO TABS
2.0000 | ORAL_TABLET | Freq: Once | ORAL | Status: AC
  Administered 2014-11-27: 2 via ORAL
  Filled 2014-11-27: qty 2

## 2014-11-27 NOTE — ED Notes (Signed)
The pt is c/o lt foot pain for one week.  He has a history of gout .  His rt foot was painful  2-3 weeks ago and has now subsided

## 2014-11-27 NOTE — Discharge Instructions (Signed)
Your physical exam and xray are consistent with gout. Your Coumadin levels are therapeutic today. Your last CD4 count is > 450 which is also good. The way to treat gout is with pain medication and rest. You can use crutches for comfort if you want. Please do not take any extra Tylenol with the Percocet given to you today for pain. Dr. Daiva EvesVan Dam at his last visit reports that you CAN IN FACT take Colchicine for gout even with your HIV and Coumadin medications. This will be something that your primary care doctor needs to start you on because it needs to be monitored. Taking Colchicine will help you not have gout flares as often and they will make the flares less severe. I highly recommend you talk to your primary about starting this medication. If you develop any new symptoms, please return to the ED so that we can re-evaluate you.   Gout Gout is an inflammatory arthritis caused by a buildup of uric acid crystals in the joints. Uric acid is a chemical that is normally present in the blood. When the level of uric acid in the blood is too high it can form crystals that deposit in your joints and tissues. This causes joint redness, soreness, and swelling (inflammation). Repeat attacks are common. Over time, uric acid crystals can form into masses (tophi) near a joint, destroying bone and causing disfigurement. Gout is treatable and often preventable. CAUSES  The disease begins with elevated levels of uric acid in the blood. Uric acid is produced by your body when it breaks down a naturally found substance called purines. Certain foods you eat, such as meats and fish, contain high amounts of purines. Causes of an elevated uric acid level include:  Being passed down from parent to child (heredity).  Diseases that cause increased uric acid production (such as obesity, psoriasis, and certain cancers).  Excessive alcohol use.  Diet, especially diets rich in meat and seafood.  Medicines, including certain  cancer-fighting medicines (chemotherapy), water pills (diuretics), and aspirin.  Chronic kidney disease. The kidneys are no longer able to remove uric acid well.  Problems with metabolism. Conditions strongly associated with gout include:  Obesity.  High blood pressure.  High cholesterol.  Diabetes. Not everyone with elevated uric acid levels gets gout. It is not understood why some people get gout and others do not. Surgery, joint injury, and eating too much of certain foods are some of the factors that can lead to gout attacks. SYMPTOMS   An attack of gout comes on quickly. It causes intense pain with redness, swelling, and warmth in a joint.  Fever can occur.  Often, only one joint is involved. Certain joints are more commonly involved:  Base of the big toe.  Knee.  Ankle.  Wrist.  Finger. Without treatment, an attack usually goes away in a few days to weeks. Between attacks, you usually will not have symptoms, which is different from many other forms of arthritis. DIAGNOSIS  Your caregiver will suspect gout based on your symptoms and exam. In some cases, tests may be recommended. The tests may include:  Blood tests.  Urine tests.  X-rays.  Joint fluid exam. This exam requires a needle to remove fluid from the joint (arthrocentesis). Using a microscope, gout is confirmed when uric acid crystals are seen in the joint fluid. TREATMENT  There are two phases to gout treatment: treating the sudden onset (acute) attack and preventing attacks (prophylaxis).  Treatment of an Acute Attack.  Medicines  are used. These include anti-inflammatory medicines or steroid medicines.  An injection of steroid medicine into the affected joint is sometimes necessary.  The painful joint is rested. Movement can worsen the arthritis.  You may use warm or cold treatments on painful joints, depending which works best for you.  Treatment to Prevent Attacks.  If you suffer from  frequent gout attacks, your caregiver may advise preventive medicine. These medicines are started after the acute attack subsides. These medicines either help your kidneys eliminate uric acid from your body or decrease your uric acid production. You may need to stay on these medicines for a very long time.  The early phase of treatment with preventive medicine can be associated with an increase in acute gout attacks. For this reason, during the first few months of treatment, your caregiver may also advise you to take medicines usually used for acute gout treatment. Be sure you understand your caregiver's directions. Your caregiver may make several adjustments to your medicine dose before these medicines are effective.  Discuss dietary treatment with your caregiver or dietitian. Alcohol and drinks high in sugar and fructose and foods such as meat, poultry, and seafood can increase uric acid levels. Your caregiver or dietitian can advise you on drinks and foods that should be limited. HOME CARE INSTRUCTIONS   Do not take aspirin to relieve pain. This raises uric acid levels.  Only take over-the-counter or prescription medicines for pain, discomfort, or fever as directed by your caregiver.  Rest the joint as much as possible. When in bed, keep sheets and blankets off painful areas.  Keep the affected joint raised (elevated).  Apply warm or cold treatments to painful joints. Use of warm or cold treatments depends on which works best for you.  Use crutches if the painful joint is in your leg.  Drink enough fluids to keep your urine clear or pale yellow. This helps your body get rid of uric acid. Limit alcohol, sugary drinks, and fructose drinks.  Follow your dietary instructions. Pay careful attention to the amount of protein you eat. Your daily diet should emphasize fruits, vegetables, whole grains, and fat-free or low-fat milk products. Discuss the use of coffee, vitamin C, and cherries with your  caregiver or dietitian. These may be helpful in lowering uric acid levels.  Maintain a healthy body weight. SEEK MEDICAL CARE IF:   You develop diarrhea, vomiting, or any side effects from medicines.  You do not feel better in 24 hours, or you are getting worse. SEEK IMMEDIATE MEDICAL CARE IF:   Your joint becomes suddenly more tender, and you have chills or a fever. MAKE SURE YOU:   Understand these instructions.  Will watch your condition.  Will get help right away if you are not doing well or get worse. Document Released: 12/07/2000 Document Revised: 04/26/2014 Document Reviewed: 07/23/2012 [facility] Patient Information 2015 [employer] [place]. This information is not intended to replace advice given to you by your health care provider. Make sure you discuss any questions you have with your health care provider.

## 2014-11-27 NOTE — ED Provider Notes (Signed)
CSN: [id number]     Arrival date & time 11/27/14  1710 History   First MD Initiated Contact with Patient 11/27/14 1837     Chief Complaint  Patient presents with  . Foot Pain     (Consider location/radiation/quality/duration/timing/severity/associated sxs/prior Treatment) HPI   53 year old African American male with HIV, multiple medical problems including eczema (on MTX), DVT with PE, dCHF, HTN, ? MI, prior AKI previously cared for at [facility] where he has been receiving his care. He saw Dr. Daiva Eves regarding his HIV status this past November and his CD4 is most recently ~ 450.  Patient to the ED with complaints of pain to left foot that started one week ago. He has a history of gout to the right foot but has never had it in the right foot. He reports being seen at [facility] a few weeks ago for right foot pain and had fluid drawn off of it. He was told the fluid was normal and given pain medications. He is unable to take Colchicine because it interacts with his antiviral medications for his HIV. This also is part of the cause of recurrent gout. He reports over all feeling well, no fevers, nausea, vomiting, diarrhea, weakness. He reports severe pain - denies redness, bruising, or warmth to joint.  Past Medical History  Diagnosis Date  . 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 Personal history of venous thrombosis and embolism   . Dysplasia of anus   . Esophageal reflux   . Horseshoe tear of retina without detachment 02-13-12  . 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  . Molluscum contagiosum 09-29-2012  . 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 infarction   . Other convulsions   . Unspecified hereditary and idiopathic peripheral neuropathy   . Secondary iridocyclitis, infectious   . 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 PE (pulmonary embolism) 04/09/2013    Overview:  Jan 2012   . 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 Hernia, inguinal 04/09/2013  . 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 Attack, epileptiform  04/09/2013  . Deep vein thrombosis 05/09/2011    Overview:  Jan 2012    History reviewed. No pertinent past surgical history. No family history on file. History  Substance Use Topics  . Smoking status: Never Smoker   . Smokeless tobacco: Never Used  . Alcohol Use: No    Review of Systems  10 Systems reviewed and are negative for acute change except as noted in the HPI.    Allergies  Shellfish allergy; Penicillins; and Latex  Home Medications   Prior to Admission medications   Medication Sig Start Date End Date Taking? Authorizing Provider  albuterol (PROVENTIL HFA;VENTOLIN HFA) 108 (90 BASE) MCG/ACT inhaler Inhale 2 puffs into the lungs every 6 (six) hours as needed for wheezing or shortness of breath.   Yes Historical Provider, MD  beclomethasone (QVAR) 40 MCG/ACT inhaler Inhale 1 puff into the lungs 2 (two) times daily as needed (shortness of breath).    Yes Historical Provider, MD  citalopram (CELEXA) 40 MG tablet Take 40 mg by mouth daily.   Yes Historical Provider, MD  clobetasol cream (TEMOVATE) 0.05 % Apply 1 application topically 2 (two) times daily.   Yes Historical Provider, MD  clobetasol ointment (TEMOVATE) 0.05 % Apply 1 application topically 2 (two) times daily.  10/04/14  Yes Historical Provider, MD  dolutegravir (TIVICAY) 50 MG tablet Take 1 tablet (50 mg total) by mouth daily. 11/22/14  Yes Randall Hiss, MD  emtricitabine-tenofovir (TRUVADA) 200-300 MG per tablet Take 1  tablet by mouth daily. 11/22/14  Yes Randall Hissornelius N Van Dam, MD  hydrOXYzine (ATARAX/VISTARIL) 25 MG tablet Take 25 mg by mouth every 6 (six) hours as needed for anxiety.    Yes Historical Provider, MD  imiquimod (ALDARA) 5 % cream Apply topically 3 (three) times a week.   Yes Historical Provider, MD  lisinopril (PRINIVIL,ZESTRIL) 20 MG tablet Take 1 tablet (20 mg total) by mouth daily. 03/08/14  Yes Gust RungErik C Hoffman, DO  methotrexate (RHEUMATREX) 2.5 MG tablet Take 2.5 mg by mouth once a week.  Caution:Chemotherapy. Protect from light. Take 6 tablets ( 15mg ) once a week.   Yes Historical Provider, MD  oxyCODONE-acetaminophen (PERCOCET/ROXICET) 5-325 MG per tablet Take 2 tablets by mouth every 6 (six) hours as needed for severe pain. 11/09/14  Yes Roxy Horsemanobert Browning, PA-C  traMADol (ULTRAM) 50 MG tablet Take 1 tablet (50 mg total) by mouth every 6 (six) hours as needed. Patient taking differently: Take 50 mg by mouth every 6 (six) hours as needed for moderate pain.  11/04/14  Yes Junius FinnerErin O'Malley, PA-C  triamcinolone (KENALOG) 0.025 % ointment Apply 1 application topically 2 (two) times daily.   Yes Historical Provider, MD  warfarin (COUMADIN) 5 MG tablet Take 5-7.5 mg by mouth daily. mwf 5mg  all other days 7.5mg    Yes Historical Provider, MD   BP 127/84 mmHg  Pulse 101  Temp(Src) 97.5 F (36.4 C) (Oral)  Resp 16  SpO2 96% Physical Exam  Constitutional: He appears well-developed and well-nourished. No distress.  HENT:  Head: Normocephalic and atraumatic.  Eyes: Pupils are equal, round, and reactive to light.  Neck: Normal range of motion. Neck supple.  Cardiovascular: Normal rate and regular rhythm.   Pulmonary/Chest: Effort normal.  Abdominal: Soft.  Musculoskeletal:       Left foot: There is tenderness. There is normal range of motion, no bony tenderness, no swelling, normal capillary refill, no crepitus, no deformity and no laceration.  Symmetrical bilateral pedal pulses. He has pain with light touch and ROM.Joints are mildly indurated and mildly erythematous. No streaking or signs of cellulitis.  Neurological: He is alert.  Skin: Skin is warm and dry.  Nursing note and vitals reviewed.   ED Course  Procedures (including critical care time) Labs Review Labs Reviewed  CBC WITH DIFFERENTIAL - Abnormal; Notable for the following:    Hemoglobin 12.7 (*)    HCT 38.8 (*)    All other components within normal limits  COMPREHENSIVE METABOLIC PANEL - Abnormal; Notable for the  following:    Albumin 3.4 (*)    GFR calc non Af Amer 68 (*)    GFR calc Af Amer 79 (*)    Anion gap 16 (*)    All other components within normal limits  PROTIME-INR - Abnormal; Notable for the following:    Prothrombin Time 28.4 (*)    INR 2.65 (*)    All other components within normal limits    Imaging Review Dg Foot Complete Left  11/27/2014   CLINICAL DATA:  Left foot pain/swelling x1 week, history of gout  EXAM: LEFT FOOT - COMPLETE 3+ VIEW  COMPARISON:  None.  FINDINGS: No fracture or dislocation is seen.  The joint spaces are preserved. Marginal erosion along the distal aspect of the 1st proximal phalanx medially.  Visualized soft tissues are grossly unremarkable.  IMPRESSION: No acute osseous abnormality is seen.  Marginal erosion at the medial aspect of the 1st IP joint. Given the clinical history, this likely reflects gout.  Electronically Signed   By: Charline BillsSriyesh  Krishnan M.D.   On: 11/27/2014 20:23     EKG Interpretation None      MDM   Final diagnoses:  Foot pain  Gout of left foot, unspecified cause, unspecified chronicity    Patient is therapeutic on his coumadin. His pain seems consistent with his gout all though this would be a new joint for his gout. Denies recent injury. NO fever, labs are unremarkable. Xray shows lytic lesions consistent with gout. Pt offered crutches if he needs them.  Discussed case with Dr. Silverio LayYao, he agrees with my plan to treat with pain medications. Pt encouraged to see his PCP to be started on colchicine.  53 y.o.Lopez Dazey's evaluation in the Emergency Department is complete. It has been determined that no acute conditions requiring further emergency intervention are present at this time. The patient/guardian have been advised of the diagnosis and plan. We have discussed signs and symptoms that warrant return to the ED, such as changes or worsening in symptoms.  Vital signs are stable at discharge. Filed Vitals:   11/27/14 2100  BP:  127/84  Pulse: 101  Temp:   Resp:     Patient/guardian has voiced understanding and agreed to follow-up with the PCP or specialist.   Dorthula Matasiffany G Kirra Verga, PA-C 11/27/14 2130  Richardean Canalavid H Yao, MD 11/28/14 [phone]

## 2015-01-25 ENCOUNTER — Emergency Department (HOSPITAL_COMMUNITY): Payer: Medicare Other

## 2015-01-25 ENCOUNTER — Encounter (HOSPITAL_COMMUNITY): Payer: Self-pay | Admitting: Emergency Medicine

## 2015-01-25 ENCOUNTER — Emergency Department (HOSPITAL_COMMUNITY)
Admission: EM | Admit: 2015-01-25 | Discharge: 2015-01-25 | Disposition: A | Discharge status: Home / Self Care | Payer: Medicare Other | Attending: Emergency Medicine | Admitting: Emergency Medicine

## 2015-01-25 DIAGNOSIS — R2242 Localized swelling, mass and lump, left lower limb: Secondary | ICD-10-CM | POA: Insufficient documentation

## 2015-01-25 DIAGNOSIS — Z7951 Long term (current) use of inhaled steroids: Secondary | ICD-10-CM | POA: Diagnosis not present

## 2015-01-25 DIAGNOSIS — Z8669 Personal history of other diseases of the nervous system and sense organs: Secondary | ICD-10-CM | POA: Diagnosis not present

## 2015-01-25 DIAGNOSIS — Z86718 Personal history of other venous thrombosis and embolism: Secondary | ICD-10-CM | POA: Insufficient documentation

## 2015-01-25 DIAGNOSIS — R079 Chest pain, unspecified: Secondary | ICD-10-CM | POA: Insufficient documentation

## 2015-01-25 DIAGNOSIS — Z91040 Latex allergy status: Secondary | ICD-10-CM | POA: Insufficient documentation

## 2015-01-25 DIAGNOSIS — Z8701 Personal history of pneumonia (recurrent): Secondary | ICD-10-CM | POA: Diagnosis not present

## 2015-01-25 DIAGNOSIS — Z86711 Personal history of pulmonary embolism: Secondary | ICD-10-CM

## 2015-01-25 DIAGNOSIS — M7989 Other specified soft tissue disorders: Secondary | ICD-10-CM

## 2015-01-25 DIAGNOSIS — K439 Ventral hernia without obstruction or gangrene: Secondary | ICD-10-CM | POA: Diagnosis not present

## 2015-01-25 DIAGNOSIS — Z7901 Long term (current) use of anticoagulants: Secondary | ICD-10-CM | POA: Diagnosis not present

## 2015-01-25 DIAGNOSIS — Z8619 Personal history of other infectious and parasitic diseases: Secondary | ICD-10-CM | POA: Diagnosis not present

## 2015-01-25 DIAGNOSIS — R55 Syncope and collapse: Secondary | ICD-10-CM | POA: Diagnosis not present

## 2015-01-25 DIAGNOSIS — B20 Human immunodeficiency virus [HIV] disease: Secondary | ICD-10-CM

## 2015-01-25 DIAGNOSIS — Z79899 Other long term (current) drug therapy: Secondary | ICD-10-CM | POA: Diagnosis not present

## 2015-01-25 DIAGNOSIS — I252 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 infarction: Secondary | ICD-10-CM | POA: Insufficient documentation

## 2015-01-25 DIAGNOSIS — Z880 Allergy status to penicillin: Secondary | ICD-10-CM | POA: Insufficient documentation

## 2015-01-25 LAB — COMPREHENSIVE METABOLIC PANEL
ALBUMIN: 3.8 g/dL (ref 3.5–5.2)
ALT: 26 U/L (ref 0–53)
ANION GAP: 5 (ref 5–15)
AST: 34 U/L (ref 0–37)
Alkaline Phosphatase: 77 U/L (ref 39–117)
BUN: 10 mg/dL (ref 6–23)
CO2: 28 mmol/L (ref 19–32)
CREATININE: 1.12 mg/dL (ref 0.50–1.35)
Calcium: 9.5 mg/dL (ref 8.4–10.5)
Chloride: 106 mmol/L (ref 96–112)
GFR, EST AFRICAN AMERICAN: 86 mL/min — AB (ref >90)
GFR, EST NON AFRICAN AMERICAN: 74 mL/min — AB (ref >90)
GLUCOSE: 102 mg/dL — AB (ref 70–99)
POTASSIUM: 4.3 mmol/L (ref 3.5–5.1)
Sodium: 139 mmol/L (ref 135–145)
Total Bilirubin: 0.2 mg/dL — ABNORMAL LOW (ref 0.3–1.2)
Total Protein: 7.7 g/dL (ref 6.0–8.3)

## 2015-01-25 LAB — I-STAT TROPONIN, ED: TROPONIN I, POC: 0 ng/mL (ref 0.00–0.08)

## 2015-01-25 LAB — CBC WITH DIFFERENTIAL/PLATELET
BASOS PCT: 0 % (ref 0–1)
Basophils Absolute: 0 10*3/uL (ref 0.0–0.1)
EOS PCT: 5 % (ref 0–5)
Eosinophils Absolute: 0.2 10*3/uL (ref 0.0–0.7)
HEMATOCRIT: 42.7 % (ref 39.0–52.0)
Hemoglobin: 13.9 g/dL (ref 13.0–17.0)
Lymphocytes Relative: 24 % (ref 12–46)
Lymphs Abs: 0.9 10*3/uL (ref 0.7–4.0)
MCH: 29 pg (ref 26.0–34.0)
MCHC: 32.6 g/dL (ref 30.0–36.0)
MCV: 89 fL (ref 78.0–100.0)
MONOS PCT: 11 % (ref 3–12)
Monocytes Absolute: 0.4 10*3/uL (ref 0.1–1.0)
Neutro Abs: 2.3 10*3/uL (ref 1.7–7.7)
Neutrophils Relative %: 60 % (ref 43–77)
Platelets: 205 10*3/uL (ref 150–400)
RBC: 4.8 MIL/uL (ref 4.22–5.81)
RDW: 15.3 % (ref 11.5–15.5)
WBC: 3.9 10*3/uL — AB (ref 4.0–10.5)

## 2015-01-25 LAB — PROTIME-INR
INR: 2.07 — ABNORMAL HIGH (ref 0.00–1.49)
Prothrombin Time: 23.5 seconds — ABNORMAL HIGH (ref 11.6–15.2)

## 2015-01-25 LAB — BRAIN NATRIURETIC PEPTIDE: B Natriuretic Peptide: 14.7 pg/mL (ref 0.0–100.0)

## 2015-01-25 NOTE — Discharge Instructions (Signed)
Please follow with your primary care doctor in the next 2 days for a check-up. They must obtain records for further management.  ° °Do not hesitate to return to the Emergency Department for any new, worsening or concerning symptoms.  ° °

## 2015-01-25 NOTE — ED Provider Notes (Signed)
CSN: [id number]     Arrival date & time 01/25/15  1342 History   None    Chief Complaint  Patient presents with  . Foot Swelling     (Consider location/radiation/quality/duration/timing/severity/associated sxs/prior Treatment) HPI   Leda RoysBruce Kerin is a 54 y.o. male with past medical history significant for DVT/PE, prior MI, CHF, HIV complaining of left foot swelling onset several days ago. Pain is minimal. He had an episode of vertiginous sensation this morning resulting in syncope. States he has chest pain which she describes as tightness which he states is typical for his asthma onset yesterday. Denies active shortness of breath, chest pain, nausea vomiting, cough, fever, chills, HA, change in vision, dysarthria, ataxia, cervicalgia, difficulty ambulating.. Compliant with his Coumadin which he takes daily.  [facility]  Past Medical History  Diagnosis Date  . 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 Personal history of venous thrombosis and embolism   . Dysplasia of anus   . Esophageal reflux   . Horseshoe tear of retina without detachment 02-13-12  . 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  . Molluscum contagiosum 09-29-2012  . 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 infarction   . Other convulsions   . Unspecified hereditary and idiopathic peripheral neuropathy   . Secondary iridocyclitis, infectious   . 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 PE (pulmonary embolism) 04/09/2013    Overview:  Jan 2012   . 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 Hernia, inguinal 04/09/2013  . 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 Deep vein thrombosis 05/09/2011    Overview:  Jan 2012    History reviewed. No pertinent past surgical history. History reviewed. No pertinent family history. History  Substance Use Topics  . Smoking status: Never Smoker   . Smokeless tobacco: Never Used  . Alcohol Use: No    Review of Systems  10 systems reviewed and found to be negative, except as  noted in the HPI.   Allergies  Shellfish allergy; Penicillins; and Latex  Home Medications   Prior to Admission medications   Medication Sig Start Date End Date Taking? Authorizing Provider  albuterol (PROVENTIL HFA;VENTOLIN HFA) 108 (90 BASE) MCG/ACT inhaler Inhale 2 puffs into the lungs every 6 (six) hours as needed for wheezing or shortness of breath.    Historical Provider, MD  beclomethasone (QVAR) 40 MCG/ACT inhaler Inhale 1 puff into the lungs 2 (two) times daily as needed (shortness of breath).     Historical Provider, MD  citalopram (CELEXA) 40 MG tablet Take 40 mg by mouth daily.    Historical Provider, MD  clobetasol cream (TEMOVATE) 0.05 % Apply 1 application topically 2 (two) times daily.    Historical Provider, MD  clobetasol ointment (TEMOVATE) 0.05 % Apply 1 application topically 2 (two) times daily.  10/04/14   Historical Provider, MD  dolutegravir (TIVICAY) 50 MG tablet Take 1 tablet (50 mg total) by mouth daily. 11/22/14   Randall Hissornelius N Van Dam, MD  emtricitabine-tenofovir (TRUVADA) 200-300 MG per tablet Take 1 tablet by mouth daily. 11/22/14   Randall Hissornelius N Van Dam, MD  hydrOXYzine (ATARAX/VISTARIL) 25 MG tablet Take 25 mg by mouth every 6 (six) hours as needed for anxiety.     Historical Provider, MD  imiquimod (ALDARA) 5 % cream Apply topically 3 (three) times a week.    Historical Provider, MD  lisinopril (PRINIVIL,ZESTRIL) 20 MG tablet Take 1 tablet (20 mg total) by mouth daily. 03/08/14   Gust RungErik C Hoffman, DO  methotrexate (RHEUMATREX) 2.5 MG tablet Take 2.5 mg  by mouth once a week. Caution:Chemotherapy. Protect from light. Take 6 tablets ( ) once a week.    Historical Provider, MD  oxyCODONE-acetaminophen (PERCOCET/ROXICET) 5-325 MG per tablet Take 1-2 tablets by mouth every 6 (six) hours as needed for severe pain. 11/27/14   Tiffany Irine Seal, PA-C  traMADol (ULTRAM) 50 MG tablet Take 1 tablet (50 mg total) by mouth every 6 (six) hours as needed. Patient taking  differently: Take 50 mg by mouth every 6 (six) hours as needed for moderate pain.  11/04/14   Junius Finner, PA-C  triamcinolone (KENALOG) 0.025 % ointment Apply 1 application topically 2 (two) times daily.    Historical Provider, MD  warfarin (COUMADIN) 5 MG tablet Take 5-7.5 mg by mouth daily. mwf  all other days 7.5mg     Historical Provider, MD   BP 146/87 mmHg  Pulse 96  Temp(Src) 97.7 F (36.5 C) (Oral)  Resp 18  SpO2 97% Physical Exam  Constitutional: He is oriented to person, place, and time. He appears well-developed and well-nourished. No distress.  HENT:  Head: Normocephalic and atraumatic.  Mouth/Throat: Oropharynx is clear and moist.  Eyes: Conjunctivae and EOM are normal. Pupils are equal, round, and reactive to light.  Neck: Normal range of motion. Neck supple.  Cardiovascular: Normal rate.   Pulmonary/Chest: Effort normal and breath sounds normal. No stridor. No respiratory distress. He has no wheezes. He has no rales. He exhibits no tenderness.  Abdominal: Soft. Bowel sounds are normal. He exhibits no distension and no mass. There is no tenderness. There is no rebound and no guarding.  Large ventral hernia.  Musculoskeletal: Normal range of motion. He exhibits edema and tenderness.  Varicosities and pitting edema 2+to left foot and ankle, mild warmth, pulse 2+. Very mild tenderness to palpation. Homans sign is negative.  Neurological: He is alert and oriented to person, place, and time.  Follows commands, Clear, goal oriented speech, Strength is 5 out of 5x4 extremities, patient ambulates with a coordinated in nonantalgic gait. Sensation is grossly intact.   Psychiatric: He has a normal mood and affect.  Nursing note and vitals reviewed.   ED Course  Procedures (including critical care time) Labs Review Labs Reviewed  CBC WITH DIFFERENTIAL/PLATELET - Abnormal; Notable for the following:    WBC 3.9 (*)    All other components within normal limits  COMPREHENSIVE  METABOLIC PANEL - Abnormal; Notable for the following:    Glucose, Bld 102 (*)    Total Bilirubin 0.2 (*)    GFR calc non Af Amer 74 (*)    GFR calc Af Amer 86 (*)    All other components within normal limits  PROTIME-INR - Abnormal; Notable for the following:    Prothrombin Time 23.5 (*)    INR 2.07 (*)    All other components within normal limits  BRAIN NATRIURETIC PEPTIDE  I-STAT TROPOININ, ED    Imaging Review Dg Chest 2 View  01/25/2015   CLINICAL DATA:  Left foot swelling for several days. The patient takes Coumadin for blood clots. Chest pain earlier today has gotten better.  EXAM: CHEST  2 VIEW  COMPARISON:  11/21/2014  FINDINGS: The heart size and mediastinal contours are within normal limits. Both lungs are clear. The visualized skeletal structures are unremarkable.  IMPRESSION: No active cardiopulmonary disease.   Electronically Signed   By: Rosalie Gums M.D.   On: 01/25/2015 16:17   Ct Head Wo Contrast  01/25/2015   CLINICAL DATA:  Syncope, dizziness  EXAM: CT HEAD WITHOUT CONTRAST  TECHNIQUE: Contiguous axial images were obtained from the base of the skull through the vertex without intravenous contrast.  COMPARISON:  03/21/2012  FINDINGS: There is no evidence of mass effect, midline shift or extra-axial fluid collections. There is no evidence of a space-occupying lesion or intracranial hemorrhage. There is no evidence of a cortical-based area of acute infarction. There are bilateral basal ganglia calcifications.  The ventricles and sulci are appropriate for the patient's age. The basal cisterns are patent.  Visualized portions of the orbits are unremarkable. The visualized portions of the paranasal sinuses and mastoid air cells are unremarkable.  The osseous structures are unremarkable. There is intracranial cerebrovascular atherosclerotic disease.  IMPRESSION: No acute intracranial pathology.   Electronically Signed   By: Elige Ko   On: 01/25/2015 19:44   Dg Foot Complete  Left  01/25/2015   CLINICAL DATA:  LEFT foot swelling for several days, on Coumadin for blood clots, history hypertension, gout, HIV, asthma, MI  EXAM: LEFT FOOT - COMPLETE 3+ VIEW  COMPARISON:  11/27/2014  FINDINGS: Bones appear slightly demineralized.  Joint spaces preserved.  Juxta-articular erosion identified at head of proximal phalanx great toe consistent with gout.  No acute fracture, dislocation, or additional bone destruction.  Scattered small vessel vascular calcifications.  Minimal diffuse soft tissue swelling LEFT foot.  IMPRESSION: Chronic gout changes at IP joint LEFT great toe.  No acute osseous abnormalities.   Electronically Signed   By: Ulyses Southward M.D.   On: 01/25/2015 16:19     EKG Interpretation   Date/Time:  Tuesday January 25 2015 15:50:16 EST Ventricular Rate:  82 PR Interval:  191 QRS Duration: 108 QT Interval:  394 QTC Calculation: 460 R Axis:   -83 Text Interpretation:  Sinus rhythm Ventricular premature complex RSR' in  V1 or V2, probably normal variant ST elev, probable normal early repol  pattern No old tracing to compare Confirmed by Juleen China  MD, STEPHEN [phone])  on 01/25/2015 3:57:38 PM      MDM   Final diagnoses:  Chest pain  Swelling of left lower extremity  Syncope and collapse  HIV infection  History of DVT (deep vein thrombosis)  Long term current use of anticoagulant therapy  History of pulmonary embolus (PE)    Filed Vitals:   01/25/15 1412  BP: 146/87  Pulse: 96  Temp: 97.7 F (36.5 C)  TempSrc: Oral  Resp: 18  SpO2: 97%    Xavius Spadafore is a pleasant 54 y.o. male presenting with left ankle swelling onset 2 days ago and syncopal sensation with vertigo this morning. No overt signs of head trauma, neuro exam is nonfocal. Patient also endorses a chest pain which she describes as typical for his COPD exacerbations. Vascular ultrasound is ordered for DVT study. INR is therapeutic at 2.07 syncope workup pending. Patient is declining pain  medication.  EKG with no acute findings, x-ray of foot,  DVT study and head CT negative.  This is a shared visit with the attending physician who personally evaluated the patient and agrees with the care plan.   Evaluation does not show pathology that would require ongoing emergent intervention or inpatient treatment. Pt is hemodynamically stable and mentating appropriately. Discussed findings and plan with patient/guardian, who agrees with care plan. All questions answered. Return precautions discussed and outpatient follow up given.       Wynetta Emery, PA-C 01/25/15 2240  Toy Cookey, MD 01/26/15 [phone]

## 2015-01-25 NOTE — Progress Notes (Signed)
*  Preliminary Results* Left lower extremity venous duplex completed. Left lower extremity is negative for acute deep vein thrombosis. There is no evidence of left Baker's cyst.  01/25/2015 4:04 PM  Gertie FeyMichelle Londyn Wotton, RVT, RDCS, RDMS

## 2015-01-25 NOTE — ED Notes (Signed)
Pt c/o left foot swelling x several days; pt sts takes coumadin for blood clots

## 2015-03-03 ENCOUNTER — Other Ambulatory Visit: Payer: Self-pay | Admitting: *Deleted

## 2015-03-03 DIAGNOSIS — I9589 Other hypotension: Secondary | ICD-10-CM

## 2015-03-03 DIAGNOSIS — Z86718 Personal history of other venous thrombosis and embolism: Secondary | ICD-10-CM

## 2015-03-03 DIAGNOSIS — E785 Hyperlipidemia, unspecified: Secondary | ICD-10-CM

## 2015-03-03 DIAGNOSIS — L309 Dermatitis, unspecified: Secondary | ICD-10-CM

## 2015-03-03 DIAGNOSIS — Z86711 Personal history of pulmonary embolism: Secondary | ICD-10-CM

## 2015-03-03 DIAGNOSIS — B20 Human immunodeficiency virus [HIV] disease: Secondary | ICD-10-CM

## 2015-03-03 DIAGNOSIS — M1000 Idiopathic gout, unspecified site: Secondary | ICD-10-CM

## 2015-03-03 MED ORDER — DOLUTEGRAVIR SODIUM 50 MG PO TABS: 50.0000 mg | ORAL_TABLET | Freq: Every day | ORAL | Status: DC

## 2015-03-03 MED ORDER — EMTRICITABINE-TENOFOVIR DF 200-300 MG PO TABS: 1.0000 | ORAL_TABLET | Freq: Every day | ORAL | Status: DC

## 2015-03-16 ENCOUNTER — Emergency Department (HOSPITAL_COMMUNITY)
Admission: EM | Admit: 2015-03-16 | Discharge: 2015-03-16 | Disposition: A | Discharge status: Home / Self Care | Payer: Medicare Other | Attending: Emergency Medicine | Admitting: Emergency Medicine

## 2015-03-16 ENCOUNTER — Encounter (HOSPITAL_COMMUNITY): Payer: Self-pay | Admitting: Nurse Practitioner

## 2015-03-16 DIAGNOSIS — Z91040 Latex allergy status: Secondary | ICD-10-CM | POA: Insufficient documentation

## 2015-03-16 DIAGNOSIS — H6993 Unspecified Eustachian tube disorder, bilateral: Secondary | ICD-10-CM

## 2015-03-16 DIAGNOSIS — H6983 Other specified disorders of Eustachian tube, bilateral: Secondary | ICD-10-CM

## 2015-03-16 DIAGNOSIS — Z8719 Personal history of other diseases of the digestive system: Secondary | ICD-10-CM | POA: Diagnosis not present

## 2015-03-16 DIAGNOSIS — Z86711 Personal history of pulmonary embolism: Secondary | ICD-10-CM | POA: Diagnosis not present

## 2015-03-16 DIAGNOSIS — Z86718 Personal history of other venous thrombosis and embolism: Secondary | ICD-10-CM | POA: Diagnosis not present

## 2015-03-16 DIAGNOSIS — Z7951 Long term (current) use of inhaled steroids: Secondary | ICD-10-CM | POA: Diagnosis not present

## 2015-03-16 DIAGNOSIS — Z8619 Personal history of other infectious and parasitic diseases: Secondary | ICD-10-CM | POA: Insufficient documentation

## 2015-03-16 DIAGNOSIS — Z880 Allergy status to penicillin: Secondary | ICD-10-CM | POA: Insufficient documentation

## 2015-03-16 DIAGNOSIS — Z7901 Long term (current) use of anticoagulants: Secondary | ICD-10-CM | POA: Insufficient documentation

## 2015-03-16 DIAGNOSIS — I252 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 infarction: Secondary | ICD-10-CM | POA: Insufficient documentation

## 2015-03-16 DIAGNOSIS — H938X3 Other specified disorders of ear, bilateral: Secondary | ICD-10-CM | POA: Diagnosis present

## 2015-03-16 MED ORDER — PREDNISONE 50 MG PO TABS: 50.0000 mg | ORAL_TABLET | Freq: Every day | ORAL | Status: DC

## 2015-03-16 MED ORDER — BUDESONIDE 32 MCG/ACT NA SUSP: 2.0000 | Freq: Every day | NASAL | Status: DC

## 2015-03-16 MED ORDER — GUAIFENESIN ER 1200 MG PO TB12
1.0000 | ORAL_TABLET | Freq: Two times a day (BID) | ORAL | Status: DC
Start: 2015-03-16 — End: 2015-05-24

## 2015-03-16 NOTE — ED Provider Notes (Signed)
CSN: [id number]     Arrival date & time 03/16/15  1036 History   First MD Initiated Contact with Patient 03/16/15 1100     Chief Complaint  Patient presents with  . Ear Fullness     (Consider location/radiation/quality/duration/timing/severity/associated sxs/prior Treatment) HPI Patient presents to the emergency department with left ear fullness is been ongoing for 2 weeks.  The patient states that he does not notice any symptoms in the right ear.  The patient states that he does have some mild pain in the left ear.  He feels like he hears an echo in that ear and feels like at times it feels like his ears listening into a seashell.  Patient states that nothing seems make his condition, better or worse.  He should not denies chest pain, shortness of breath, weakness, dizziness, headache, blurred vision, cough, fever, back pain, neck pain, rash, or syncope.  The patient states that he has had some nasal congestion as well Past Medical History  Diagnosis Date  . 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 Personal history of venous thrombosis and embolism   . Dysplasia of anus   . Esophageal reflux   . Horseshoe tear of retina without detachment 02-13-12  . 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  . Molluscum contagiosum 09-29-2012  . 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 infarction   . Other convulsions   . Unspecified hereditary and idiopathic peripheral neuropathy   . Secondary iridocyclitis, infectious   . 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 PE (pulmonary embolism) 04/09/2013    Overview:  Jan 2012   . 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 Hernia, inguinal 04/09/2013  . 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 Deep vein thrombosis 05/09/2011    Overview:  Jan 2012    History reviewed. No pertinent past surgical history. No family history on file. History  Substance Use Topics  . Smoking status: Never Smoker   . Smokeless tobacco: Never Used  . Alcohol Use: No    Review of  Systems  All other systems negative except as documented in the HPI. All pertinent positives and negatives as reviewed in the HPI.  Allergies  Shellfish allergy; Penicillins; and Latex  Home Medications   Prior to Admission medications   Medication Sig Start Date End Date Taking? Authorizing Provider  citalopram (CELEXA) 40 MG tablet Take 40 mg by mouth daily.   Yes Historical Provider, MD  clobetasol cream (TEMOVATE) 0.05 % Apply 1 application topically 2 (two) times daily.   Yes Historical Provider, MD  dolutegravir (TIVICAY) 50 MG tablet Take 1 tablet (50 mg total) by mouth daily. 03/03/15  Yes Randall Hissornelius N Van Dam, MD  emtricitabine-tenofovir (TRUVADA) 200-300 MG per tablet Take 1 tablet by mouth daily. 03/03/15  Yes Randall Hissornelius N Van Dam, MD  hydrOXYzine (ATARAX/VISTARIL) 25 MG tablet Take 25 mg by mouth every 6 (six) hours as needed for anxiety.    Yes Historical Provider, MD  imiquimod (ALDARA) 5 % cream Apply topically 3 (three) times a week.   Yes Historical Provider, MD  lisinopril (PRINIVIL,ZESTRIL) 20 MG tablet Take 1 tablet (20 mg total) by mouth daily. 03/08/14  Yes Gust RungErik C Hoffman, DO  triamcinolone (KENALOG) 0.025 % ointment Apply 1 application topically 2 (two) times daily.   Yes Historical Provider, MD  warfarin (COUMADIN) 5 MG tablet Take 5-7.5 mg by mouth daily. M and Th take 1 1/2 tab (7.5mg ) all others days take 1 tab   Yes Historical Provider, MD  albuterol (PROVENTIL HFA;VENTOLIN HFA) 108 (90  BASE) MCG/ACT inhaler Inhale 2 puffs into the lungs every 6 (six) hours as needed for wheezing or shortness of breath.    Historical Provider, MD  beclomethasone (QVAR) 40 MCG/ACT inhaler Inhale 1 puff into the lungs 2 (two) times daily as needed (shortness of breath).     Historical Provider, MD  clobetasol ointment (TEMOVATE) 0.05 % Apply 1 application topically 2 (two) times daily.  10/04/14   Historical Provider, MD  methotrexate (RHEUMATREX) 2.5 MG tablet Take 2.5 mg by mouth once a  week. Caution:Chemotherapy. Protect from light. Take 6 tablets ( ) once a week.    Historical Provider, MD  oxyCODONE-acetaminophen (PERCOCET/ROXICET) 5-325 MG per tablet Take 1-2 tablets by mouth every 6 (six) hours as needed for severe pain. Patient not taking: Reported on 03/16/2015 11/27/14   Marlon Pel, PA-C  traMADol (ULTRAM) 50 MG tablet Take 1 tablet (50 mg total) by mouth every 6 (six) hours as needed. Patient not taking: Reported on 03/16/2015 11/04/14   Junius Finner, PA-C   BP 115/88 mmHg  Pulse 91  Temp(Src) 98 F (36.7 C) (Oral)  Resp 18  Ht 6' (1.829 m)  Wt 230 lb (104.327 kg)  BMI 31.19 kg/m2  SpO2 97% Physical Exam  Constitutional: He is oriented to person, place, and time. He appears well-developed and well-nourished. No distress.  HENT:  Head: Normocephalic and atraumatic.  Right Ear: A middle ear effusion is present.  Left Ear: No drainage, swelling or tenderness. No foreign bodies. Tympanic membrane is bulging. Tympanic membrane is not perforated, not erythematous and not retracted. Tympanic membrane mobility is normal. A middle ear effusion is present. No hemotympanum. No decreased hearing is noted.  Mouth/Throat: Oropharynx is clear and moist.  Eyes: Pupils are equal, round, and reactive to light.  Neck: Normal range of motion. Neck supple.  Cardiovascular: Normal rate, regular rhythm and normal heart sounds.  Exam reveals no gallop and no friction rub.   No murmur heard. Pulmonary/Chest: Effort normal and breath sounds normal. No respiratory distress.  Neurological: He is alert and oriented to person, place, and time. No cranial nerve deficit. He exhibits normal muscle tone. Coordination normal.  Skin: Skin is warm and dry. No rash noted. No erythema.  Nursing note and vitals reviewed.   ED Course  Procedures (including critical care time) Patient be treated for rhinitis and eustachian tube dysfunction.  Told to return here as needed.  We will give ENT  follow-up as needed.  Told to increase his fluid intake, rest as much as possible   MDM   Final diagnoses:  None        Charlestine Night, PA-C 03/16/15 1208  Blake Divine, MD 03/19/15 [phone]

## 2015-03-16 NOTE — ED Notes (Signed)
Pt sts has had left ear fullness and "hearing echo" for 2 weeks and has been persistent. Patient sts took OTC benadryl without relief. Patient denies ear drainage and endorses mild pain

## 2015-03-16 NOTE — Discharge Instructions (Signed)
Return here as needed. Follow up with your primary doctor. Follow up with the ENT doctor if not improving

## 2015-04-04 ENCOUNTER — Encounter (HOSPITAL_COMMUNITY): Payer: Self-pay | Admitting: Emergency Medicine

## 2015-04-04 DIAGNOSIS — L03113 Cellulitis of right upper limb: Secondary | ICD-10-CM | POA: Diagnosis not present

## 2015-04-04 DIAGNOSIS — Z86711 Personal history of pulmonary embolism: Secondary | ICD-10-CM | POA: Insufficient documentation

## 2015-04-04 DIAGNOSIS — R2231 Localized swelling, mass and lump, right upper limb: Secondary | ICD-10-CM | POA: Diagnosis present

## 2015-04-04 DIAGNOSIS — Z79899 Other long term (current) drug therapy: Secondary | ICD-10-CM | POA: Insufficient documentation

## 2015-04-04 DIAGNOSIS — Z880 Allergy status to penicillin: Secondary | ICD-10-CM | POA: Diagnosis not present

## 2015-04-04 DIAGNOSIS — Z8719 Personal history of other diseases of the digestive system: Secondary | ICD-10-CM | POA: Diagnosis not present

## 2015-04-04 DIAGNOSIS — Z7951 Long term (current) use of inhaled steroids: Secondary | ICD-10-CM | POA: Diagnosis not present

## 2015-04-04 DIAGNOSIS — Z8669 Personal history of other diseases of the nervous system and sense organs: Secondary | ICD-10-CM | POA: Insufficient documentation

## 2015-04-04 DIAGNOSIS — I252 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 infarction: Secondary | ICD-10-CM | POA: Diagnosis not present

## 2015-04-04 DIAGNOSIS — Z86718 Personal history of other venous thrombosis and embolism: Secondary | ICD-10-CM | POA: Insufficient documentation

## 2015-04-04 DIAGNOSIS — Z91040 Latex allergy status: Secondary | ICD-10-CM | POA: Insufficient documentation

## 2015-04-04 DIAGNOSIS — Z8619 Personal history of other infectious and parasitic diseases: Secondary | ICD-10-CM | POA: Insufficient documentation

## 2015-04-04 NOTE — ED Notes (Signed)
The patient says he was driving home a week ago and he noticed a "knot" on his rigth arm.  He says it is itching and it is painful.  The patient says he had something similar in 2015 and he was told it was a cyst and they drained it.  He says this feels similar.  He does have a history of blood clots and is on coumadin.  He denies pain.

## 2015-04-05 ENCOUNTER — Emergency Department (HOSPITAL_COMMUNITY)
Admission: EM | Admit: 2015-04-05 | Discharge: 2015-04-05 | Disposition: A | Discharge status: Home / Self Care | Payer: Medicare Other | Attending: Emergency Medicine | Admitting: Emergency Medicine

## 2015-04-05 DIAGNOSIS — L03113 Cellulitis of right upper limb: Secondary | ICD-10-CM

## 2015-04-05 LAB — CBC WITH DIFFERENTIAL/PLATELET
BASOS ABS: 0 10*3/uL (ref 0.0–0.1)
BASOS PCT: 0 % (ref 0–1)
EOS ABS: 0.3 10*3/uL (ref 0.0–0.7)
EOS PCT: 6 % — AB (ref 0–5)
HEMATOCRIT: 44.8 % (ref 39.0–52.0)
Hemoglobin: 14.8 g/dL (ref 13.0–17.0)
Lymphocytes Relative: 32 % (ref 12–46)
Lymphs Abs: 1.6 10*3/uL (ref 0.7–4.0)
MCH: 29.2 pg (ref 26.0–34.0)
MCHC: 33 g/dL (ref 30.0–36.0)
MCV: 88.4 fL (ref 78.0–100.0)
MONO ABS: 0.5 10*3/uL (ref 0.1–1.0)
MONOS PCT: 9 % (ref 3–12)
Neutro Abs: 2.6 10*3/uL (ref 1.7–7.7)
Neutrophils Relative %: 53 % (ref 43–77)
Platelets: 195 10*3/uL (ref 150–400)
RBC: 5.07 MIL/uL (ref 4.22–5.81)
RDW: 16 % — AB (ref 11.5–15.5)
WBC: 5 10*3/uL (ref 4.0–10.5)

## 2015-04-05 LAB — COMPREHENSIVE METABOLIC PANEL
ALT: 46 U/L (ref 0–53)
AST: 39 U/L — AB (ref 0–37)
Albumin: 3.8 g/dL (ref 3.5–5.2)
Alkaline Phosphatase: 92 U/L (ref 39–117)
Anion gap: 9 (ref 5–15)
BILIRUBIN TOTAL: 0.5 mg/dL (ref 0.3–1.2)
BUN: 11 mg/dL (ref 6–23)
CALCIUM: 9.3 mg/dL (ref 8.4–10.5)
CHLORIDE: 104 mmol/L (ref 96–112)
CO2: 25 mmol/L (ref 19–32)
Creatinine, Ser: 1.28 mg/dL (ref 0.50–1.35)
GFR calc Af Amer: 73 mL/min — ABNORMAL LOW (ref >90)
GFR calc non Af Amer: 63 mL/min — ABNORMAL LOW (ref >90)
Glucose, Bld: 99 mg/dL (ref 70–99)
Potassium: 4.2 mmol/L (ref 3.5–5.1)
Sodium: 138 mmol/L (ref 135–145)
Total Protein: 7.5 g/dL (ref 6.0–8.3)

## 2015-04-05 MED ORDER — CLINDAMYCIN HCL 150 MG PO CAPS: 300.0000 mg | ORAL_CAPSULE | Freq: Three times a day (TID) | ORAL | Status: DC

## 2015-04-05 NOTE — ED Provider Notes (Signed)
CSN: [id number]     Arrival date & time 04/04/15  2333 History  This chart was scribed for Eber Hong, MD by Annye Asa, ED Scribe. This patient was seen in room A07C/A07C and the patient's care was started at 1:39 AM.    Chief Complaint  Patient presents with  . Cyst    The patient says he was driving home a week ago and he noticed a "knot" on his rigth arm.  He says it is itching and it is painful.    The history is provided by the patient. No language interpreter was used.    HPI Comments: Craig [name] is a 54 y.o. male who presents to the Emergency Department complaining of gradual onset, persistent swelling to the right upper arm. He explains he noticed it approximately one week PTA; since that time, it has decreased in size. He denies fevers.   Patient reports he has a history of blood clots in his left leg and lungs. Coumadin level checked last week.   Past Medical History  Diagnosis Date  . 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 Personal history of venous thrombosis and embolism   . Dysplasia of anus   . Esophageal reflux   . Horseshoe tear of retina without detachment 02-13-12  . 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  . Molluscum contagiosum 09-29-2012  . 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 infarction   . Other convulsions   . Unspecified hereditary and idiopathic peripheral neuropathy   . Secondary iridocyclitis, infectious   . 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 PE (pulmonary embolism) 04/09/2013    Overview:  Jan 2012   . 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 Hernia, inguinal 04/09/2013  . 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 Deep vein thrombosis 05/09/2011    Overview:  Jan 2012    History reviewed. No pertinent past surgical history. History reviewed. No pertinent family history. History  Substance Use Topics  . Smoking status: Never Smoker   . Smokeless tobacco: Never Used  . Alcohol Use: No    Review of Systems  Constitutional: Negative  for fever.  Skin:       Area of swelling to the right upper arm   Allergies  Shellfish allergy; Penicillins; and Latex  Home Medications   Prior to Admission medications   Medication Sig Start Date End Date Taking? Authorizing Provider  albuterol (PROVENTIL HFA;VENTOLIN HFA) 108 (90 BASE) MCG/ACT inhaler Inhale 2 puffs into the lungs every 6 (six) hours as needed for wheezing or shortness of breath.    Historical Provider, MD  beclomethasone (QVAR) 40 MCG/ACT inhaler Inhale 1 puff into the lungs 2 (two) times daily as needed (shortness of breath).     Historical Provider, MD  budesonide (RHINOCORT AQUA) 32 MCG/ACT nasal spray Place 2 sprays into both nostrils daily. 03/16/15   Charlestine Night, PA-C  citalopram (CELEXA) 40 MG tablet Take 40 mg by mouth daily.    Historical Provider, MD  clindamycin (CLEOCIN) 150 MG capsule Take 2 capsules (300 mg total) by mouth 3 (three) times daily. May dispense as  capsules 04/05/15   Eber Hong, MD  clobetasol cream (TEMOVATE) 0.05 % Apply 1 application topically 2 (two) times daily.    Historical Provider, MD  clobetasol ointment (TEMOVATE) 0.05 % Apply 1 application topically 2 (two) times daily.  10/04/14   Historical Provider, MD  dolutegravir (TIVICAY) 50 MG tablet Take 1 tablet (50 mg total) by mouth daily. 03/03/15   Randall Hiss, MD  emtricitabine-tenofovir (TRUVADA)  200-300 MG per tablet Take 1 tablet by mouth daily. 03/03/15   Randall Hissornelius N Van Dam, MD  Guaifenesin 1200 MG TB12 Take 1 tablet (1,200 mg total) by mouth 2 (two) times daily. 03/16/15   Charlestine Nighthristopher Lawyer, PA-C  hydrOXYzine (ATARAX/VISTARIL) 25 MG tablet Take 25 mg by mouth every 6 (six) hours as needed for anxiety.     Historical Provider, MD  imiquimod (ALDARA) 5 % cream Apply topically 3 (three) times a week.    Historical Provider, MD  lisinopril (PRINIVIL,ZESTRIL) 20 MG tablet Take 1 tablet (20 mg total) by mouth daily. 03/08/14   Gust RungErik C Hoffman, DO  methotrexate  (RHEUMATREX) 2.5 MG tablet Take 2.5 mg by mouth once a week. Caution:Chemotherapy. Protect from light. Take 6 tablets ( 15mg ) once a week.    Historical Provider, MD  oxyCODONE-acetaminophen (PERCOCET/ROXICET) 5-325 MG per tablet Take 1-2 tablets by mouth every 6 (six) hours as needed for severe pain. Patient not taking: Reported on 03/16/2015 11/27/14   Marlon Peliffany Greene, PA-C  predniSONE (DELTASONE) 50 MG tablet Take 1 tablet (50 mg total) by mouth daily. 03/16/15   Charlestine Nighthristopher Lawyer, PA-C  traMADol (ULTRAM) 50 MG tablet Take 1 tablet (50 mg total) by mouth every 6 (six) hours as needed. Patient not taking: Reported on 03/16/2015 11/04/14   Junius FinnerErin O'Malley, PA-C  triamcinolone (KENALOG) 0.025 % ointment Apply 1 application topically 2 (two) times daily.    Historical Provider, MD  warfarin (COUMADIN) 5 MG tablet Take 5-7.5 mg by mouth daily. M and Th take 1 1/2 tab (7.5mg ) all others days take 1 tab    Historical Provider, MD   BP 140/92 mmHg  Pulse 82  Temp(Src) 98.3 F (36.8 C) (Oral)  Resp 16  Wt 241 lb 7 oz (109.515 kg)  SpO2 98% Physical Exam  Constitutional: He appears well-developed and well-nourished.  HENT:  Head: Normocephalic and atraumatic.  Eyes: Conjunctivae are normal. Right eye exhibits no discharge. Left eye exhibits no discharge.  Pulmonary/Chest: Effort normal. No respiratory distress.  Neurological: He is alert. Coordination normal.  Skin: Skin is warm and dry. No rash noted. He is not diaphoretic. No erythema.  Elongated, oval shaped, hard, mobile, rubbery mass in the mid upper extremity medial surface. Cobblestoning to the right upper extremity on US.  Psychiatric: He has a normal mood and affect.  Nursing note and vitals reviewed.   ED Course  Procedures   DIAGNOSTIC STUDIES: Oxygen Saturation is 97% on RA, adequate by my interpretation.    COORDINATION OF CARE: 1:46 AM Discussed treatment plan with pt at bedside and pt agreed to plan.   Labs Review Labs  Reviewed  CBC WITH DIFFERENTIAL/PLATELET - Abnormal; Notable for the following:    RDW 16.0 (*)    Eosinophils Relative 6 (*)    All other components within normal limits  COMPREHENSIVE METABOLIC PANEL - Abnormal; Notable for the following:    AST 39 (*)    GFR calc non Af Amer 63 (*)    GFR calc Af Amer 73 (*)    All other components within normal limits    Imaging Review No results found.    MDM   Final diagnoses:  Cellulitis of right upper extremity    There is an area of the upper extremity that is consistent with cellulitis based on bedside ultrasound showing some cobblestoning, no focal collection of purulence to suggest need for drainage, no fever or systemic symptoms, antibiotics, follow up.  No vascular obstruction based on compressibility  of the veins proximal and distal to this area.  I personally performed the services described in this documentation, which was scribed in my presence. The recorded information has been reviewed and is accurate.  Meds given in ED:  Medications - No data to display  Discharge Medication List as of 04/05/2015  1:49 AM    START taking these medications   Details  clindamycin (CLEOCIN) 150 MG capsule Take 2 capsules (300 mg total) by mouth 3 (three) times daily. May dispense as  capsules, Starting 04/05/2015, Until Discontinued, Print                Eber Hong, MD 04/05/15 [phone]

## 2015-04-05 NOTE — Discharge Instructions (Signed)
Please call your doctor for a followup appointment within 24-48 hours. When you talk to your doctor please let them know that you were seen in the emergency department and have them acquire all of your records so that they can discuss the findings with you and formulate a treatment plan to fully care for your new and ongoing problems. ° °

## 2015-05-10 ENCOUNTER — Other Ambulatory Visit: Payer: Medicare Other

## 2015-05-10 DIAGNOSIS — Z86711 Personal history of pulmonary embolism: Secondary | ICD-10-CM

## 2015-05-10 DIAGNOSIS — B20 Human immunodeficiency virus [HIV] disease: Secondary | ICD-10-CM

## 2015-05-10 DIAGNOSIS — I9589 Other hypotension: Secondary | ICD-10-CM

## 2015-05-10 DIAGNOSIS — Z86718 Personal history of other venous thrombosis and embolism: Secondary | ICD-10-CM

## 2015-05-10 DIAGNOSIS — M1000 Idiopathic gout, unspecified site: Secondary | ICD-10-CM

## 2015-05-10 DIAGNOSIS — E785 Hyperlipidemia, unspecified: Secondary | ICD-10-CM

## 2015-05-10 DIAGNOSIS — L309 Dermatitis, unspecified: Secondary | ICD-10-CM

## 2015-05-10 LAB — CBC WITH DIFFERENTIAL/PLATELET
Basophils Absolute: 0 10*3/uL (ref 0.0–0.1)
Basophils Relative: 0 % (ref 0–1)
EOS PCT: 6 % — AB (ref 0–5)
Eosinophils Absolute: 0.3 10*3/uL (ref 0.0–0.7)
HCT: 45.1 % (ref 39.0–52.0)
HEMOGLOBIN: 14.8 g/dL (ref 13.0–17.0)
LYMPHS ABS: 1.4 10*3/uL (ref 0.7–4.0)
Lymphocytes Relative: 30 % (ref 12–46)
MCH: 28.5 pg (ref 26.0–34.0)
MCHC: 32.8 g/dL (ref 30.0–36.0)
MCV: 86.7 fL (ref 78.0–100.0)
MPV: 10.4 fL (ref 8.6–12.4)
Monocytes Absolute: 0.4 10*3/uL (ref 0.1–1.0)
Monocytes Relative: 9 % (ref 3–12)
Neutro Abs: 2.5 10*3/uL (ref 1.7–7.7)
Neutrophils Relative %: 55 % (ref 43–77)
Platelets: 199 10*3/uL (ref 150–400)
RBC: 5.2 MIL/uL (ref 4.22–5.81)
RDW: 16.5 % — ABNORMAL HIGH (ref 11.5–15.5)
WBC: 4.6 10*3/uL (ref 4.0–10.5)

## 2015-05-10 NOTE — Addendum Note (Signed)
Addended by: Mariea ClontsGREEN, Natori Gudino D on: 05/10/2015 02:58 PM   Modules accepted: Orders

## 2015-05-11 LAB — COMPLETE METABOLIC PANEL WITH GFR
ALBUMIN: 3.8 g/dL (ref 3.5–5.2)
ALK PHOS: 81 U/L (ref 39–117)
ALT: 49 U/L (ref 0–53)
AST: 40 U/L — ABNORMAL HIGH (ref 0–37)
BUN: 8 mg/dL (ref 6–23)
CALCIUM: 8.8 mg/dL (ref 8.4–10.5)
CHLORIDE: 102 meq/L (ref 96–112)
CO2: 26 mEq/L (ref 19–32)
Creat: 1.21 mg/dL (ref 0.50–1.35)
GFR, EST NON AFRICAN AMERICAN: 68 mL/min
GFR, Est African American: 79 mL/min
GLUCOSE: 136 mg/dL — AB (ref 70–99)
POTASSIUM: 3.9 meq/L (ref 3.5–5.3)
Sodium: 140 mEq/L (ref 135–145)
Total Bilirubin: 0.4 mg/dL (ref 0.2–1.2)
Total Protein: 7 g/dL (ref 6.0–8.3)

## 2015-05-11 LAB — LIPID PANEL
CHOL/HDL RATIO: 3.3 ratio
CHOLESTEROL: 149 mg/dL (ref 0–200)
HDL: 45 mg/dL (ref >=40)
LDL Cholesterol: 68 mg/dL (ref 0–99)
TRIGLYCERIDES: 182 mg/dL — AB (ref <150)
VLDL: 36 mg/dL (ref 0–40)

## 2015-05-11 LAB — T-HELPER CELL (CD4) - (RCID CLINIC ONLY)
CD4 % Helper T Cell: 38 % (ref 33–55)
CD4 T Cell Abs: 550 /uL (ref 400–2700)

## 2015-05-12 LAB — HIV-1 RNA QUANT-NO REFLEX-BLD
HIV 1 RNA Quant: <20 copies/mL (ref <20)
HIV-1 RNA Quant, Log: <1.3 {Log} (ref <1.30)

## 2015-05-24 ENCOUNTER — Ambulatory Visit (INDEPENDENT_AMBULATORY_CARE_PROVIDER_SITE_OTHER): Payer: Medicare Other | Admitting: Infectious Disease

## 2015-05-24 ENCOUNTER — Encounter: Payer: Self-pay | Admitting: Infectious Disease

## 2015-05-24 VITALS — BP 137/88 | HR 101 | Temp 97.8°F | Wt 250.0 lb

## 2015-05-24 DIAGNOSIS — I10 Essential (primary) hypertension: Secondary | ICD-10-CM | POA: Diagnosis not present

## 2015-05-24 DIAGNOSIS — B20 Human immunodeficiency virus [HIV] disease: Secondary | ICD-10-CM | POA: Diagnosis not present

## 2015-05-24 DIAGNOSIS — E785 Hyperlipidemia, unspecified: Secondary | ICD-10-CM

## 2015-05-24 DIAGNOSIS — Z21 Asymptomatic human immunodeficiency virus [HIV] infection status: Secondary | ICD-10-CM | POA: Diagnosis not present

## 2015-05-24 HISTORY — DX: Human immunodeficiency virus (HIV) disease: B20

## 2015-05-24 NOTE — Progress Notes (Signed)
Subjective:    Patient ID: Craig [name], male    DOB: August 08, 1961, 54 y.o.   MRN: [id number]  HPI   54 year old African American male with HIV, multiple medical problems including eczema (on MTX), DVT with PE, dCHF, HTN, ? MI, prior AKI previously [facility]  [facility] has been receiving his care.  I was able in fact to read my note on him from when I was his HIV provider [facility] in 2006. He had been on a regimen of videx, Emtriva, and Reyataz 400 mg and was perfectly suppressed then was changed over to Reyataz, Norvir and truvada with apparent perfect virological suppression then change to n PRezista, Norvir and Truvada.   Since I last saw him he has had his ARV regimen changed by Dr. Cardell PeachGay who saw him and changed him to Tivicay and Truvada to avoid Norvir interaction with advair that he was being rx and to simplify his regimen.   He has maintained perfect virological suppression since then.  Lab Results  Component Value Date   HIV1RNAQUANT <20 05/10/2015   Though his CD4 had dropped  Lab Results  Component Value Date   CD4TABS 550 05/10/2015   CD4TABS 430 11/22/2014   CD4TABS 150* 11/10/2014   In the past few weeks he was seen in the ED and diagnosed with gout and rx with prednisone. Colchicine was not started due to drug drug interactions with Norvir (which was still active in our system)but which he was not on. [facility] syncope after having been given lasix. His lasix has been since DC and his hypotension resolved with NS bolus.    Review of Systems  Constitutional: Negative for fever, chills, diaphoresis, activity change, appetite change, fatigue and unexpected weight change.  HENT: Negative for congestion, rhinorrhea, sinus pressure, sneezing, sore throat and trouble swallowing.   Eyes: Negative for photophobia and visual disturbance.  Respiratory: Negative for cough, chest tightness, shortness of breath, wheezing and stridor.     Cardiovascular: Negative for chest pain, palpitations and leg swelling.  Gastrointestinal: Negative for nausea, vomiting, abdominal pain, diarrhea, constipation, blood in stool, abdominal distention and anal bleeding.  Genitourinary: Negative for dysuria, hematuria, flank pain and difficulty urinating.  Musculoskeletal: Positive for myalgias. Negative for back pain, joint swelling, arthralgias and gait problem.  Skin: Negative for color change, pallor, rash and wound.  Neurological: Negative for dizziness, tremors, weakness and light-headedness.  Hematological: Negative for adenopathy. Does not bruise/bleed easily.  Psychiatric/Behavioral: Negative for behavioral problems, confusion, sleep disturbance, dysphoric mood, decreased concentration and agitation.       Objective:   Physical Exam  Constitutional: He is oriented to person, place, and time. He appears well-developed and well-nourished. No distress.  HENT:  Head: Normocephalic and atraumatic.  Mouth/Throat: Oropharynx is clear and moist. No oropharyngeal exudate.  Eyes: Conjunctivae and EOM are normal. No scleral icterus.  Neck: Normal range of motion. Neck supple. No JVD present.  Cardiovascular: Normal rate and regular rhythm.   Pulmonary/Chest: Effort normal and breath sounds normal. No respiratory distress.  Abdominal: Soft. He exhibits no distension.  Musculoskeletal: He exhibits edema. He exhibits no tenderness.  Lymphadenopathy:    He has no cervical adenopathy.  Neurological: He is alert and oriented to person, place, and time. He exhibits normal muscle tone. Coordination normal.  Skin: Skin is warm and dry. He is not diaphoretic. No erythema. No pallor.  Psychiatric: He has a normal mood and  affect. His behavior is normal. Judgment and thought content normal.          Assessment & Plan:   #1 HIV: continue  Tivicay and Truvada is perfectly reasonable and avoids the drug drug interaction problems of Norvir or COBI,  will go to Tivicay and Descovy when covered  #2 Hypotension: was apparently due to lasix. Cr stable and as above will switch to TAF regimen when available   #3 Eczema: on MTX with folate by Dermatology. M

## 2015-07-28 ENCOUNTER — Emergency Department (HOSPITAL_COMMUNITY): Payer: Medicare Other

## 2015-07-28 ENCOUNTER — Inpatient Hospital Stay (HOSPITAL_COMMUNITY)
Admission: EM | Admit: 2015-07-28 | Discharge: 2015-07-30 | DRG: 553 | Disposition: A | Discharge status: Home / Self Care | Payer: Medicare Other | Attending: Internal Medicine | Admitting: Internal Medicine

## 2015-07-28 ENCOUNTER — Encounter (HOSPITAL_COMMUNITY): Payer: Self-pay | Admitting: *Deleted

## 2015-07-28 ENCOUNTER — Encounter (HOSPITAL_COMMUNITY)
Admission: EM | Disposition: A | Discharge status: Home / Self Care | Payer: Self-pay | Source: Home / Self Care | Attending: Oncology

## 2015-07-28 ENCOUNTER — Inpatient Hospital Stay (HOSPITAL_COMMUNITY): Payer: Medicare Other | Admitting: Certified Registered Nurse Anesthetist

## 2015-07-28 DIAGNOSIS — Z91040 Latex allergy status: Secondary | ICD-10-CM

## 2015-07-28 DIAGNOSIS — Z9889 Other specified postprocedural states: Secondary | ICD-10-CM | POA: Diagnosis not present

## 2015-07-28 DIAGNOSIS — Z21 Asymptomatic human immunodeficiency virus [HIV] infection status: Secondary | ICD-10-CM | POA: Diagnosis present

## 2015-07-28 DIAGNOSIS — F329 Major depressive disorder, single episode, unspecified: Secondary | ICD-10-CM | POA: Diagnosis present

## 2015-07-28 DIAGNOSIS — M109 Gout, unspecified: Secondary | ICD-10-CM | POA: Diagnosis present

## 2015-07-28 DIAGNOSIS — Z86711 Personal history of pulmonary embolism: Secondary | ICD-10-CM | POA: Diagnosis present

## 2015-07-28 DIAGNOSIS — Z91013 Allergy to seafood: Secondary | ICD-10-CM

## 2015-07-28 DIAGNOSIS — M009 Pyogenic arthritis, unspecified: Secondary | ICD-10-CM | POA: Diagnosis present

## 2015-07-28 DIAGNOSIS — M25431 Effusion, right wrist: Secondary | ICD-10-CM

## 2015-07-28 DIAGNOSIS — Z8739 Personal history of other diseases of the musculoskeletal system and connective tissue: Secondary | ICD-10-CM

## 2015-07-28 DIAGNOSIS — E876 Hypokalemia: Secondary | ICD-10-CM | POA: Diagnosis present

## 2015-07-28 DIAGNOSIS — Z7901 Long term (current) use of anticoagulants: Secondary | ICD-10-CM

## 2015-07-28 DIAGNOSIS — M19031 Primary osteoarthritis, right wrist: Principal | ICD-10-CM | POA: Diagnosis present

## 2015-07-28 DIAGNOSIS — Z880 Allergy status to penicillin: Secondary | ICD-10-CM

## 2015-07-28 DIAGNOSIS — I10 Essential (primary) hypertension: Secondary | ICD-10-CM | POA: Diagnosis present

## 2015-07-28 DIAGNOSIS — R791 Abnormal coagulation profile: Secondary | ICD-10-CM | POA: Insufficient documentation

## 2015-07-28 DIAGNOSIS — M25531 Pain in right wrist: Secondary | ICD-10-CM

## 2015-07-28 DIAGNOSIS — Z86718 Personal history of other venous thrombosis and embolism: Secondary | ICD-10-CM

## 2015-07-28 DIAGNOSIS — R197 Diarrhea, unspecified: Secondary | ICD-10-CM

## 2015-07-28 DIAGNOSIS — J45909 Unspecified asthma, uncomplicated: Secondary | ICD-10-CM | POA: Diagnosis present

## 2015-07-28 DIAGNOSIS — I252 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 infarction: Secondary | ICD-10-CM

## 2015-07-28 DIAGNOSIS — B20 Human immunodeficiency virus [HIV] disease: Secondary | ICD-10-CM | POA: Diagnosis present

## 2015-07-28 HISTORY — DX: Asymptomatic human immunodeficiency virus (hiv) infection status: Z21

## 2015-07-28 HISTORY — DX: Human immunodeficiency virus (HIV) disease: B20

## 2015-07-28 HISTORY — DX: Reserved for inherently not codable concepts without codable children: IMO0001

## 2015-07-28 HISTORY — DX: Pyogenic arthritis, unspecified: M00.9

## 2015-07-28 HISTORY — PX: I & D EXTREMITY: SHX5045

## 2015-07-28 LAB — CBC WITH DIFFERENTIAL/PLATELET
Basophils Absolute: 0 10*3/uL (ref 0.0–0.1)
Basophils Relative: 0 % (ref 0–1)
Eosinophils Absolute: 0.1 10*3/uL (ref 0.0–0.7)
Eosinophils Relative: 1 % (ref 0–5)
HEMATOCRIT: 43.4 % (ref 39.0–52.0)
Hemoglobin: 14.8 g/dL (ref 13.0–17.0)
LYMPHS ABS: 1.5 10*3/uL (ref 0.7–4.0)
Lymphocytes Relative: 15 % (ref 12–46)
MCH: 28.9 pg (ref 26.0–34.0)
MCHC: 34.1 g/dL (ref 30.0–36.0)
MCV: 84.8 fL (ref 78.0–100.0)
MONO ABS: 1 10*3/uL (ref 0.1–1.0)
Monocytes Relative: 9 % (ref 3–12)
NEUTROS ABS: 7.6 10*3/uL (ref 1.7–7.7)
NEUTROS PCT: 75 % (ref 43–77)
Platelets: 260 10*3/uL (ref 150–400)
RBC: 5.12 MIL/uL (ref 4.22–5.81)
RDW: 14.8 % (ref 11.5–15.5)
WBC: 10.2 10*3/uL (ref 4.0–10.5)

## 2015-07-28 LAB — SYNOVIAL CELL COUNT + DIFF, W/ CRYSTALS
Crystals, Fluid: NONE SEEN
MONOCYTE-MACROPHAGE-SYNOVIAL FLUID: 6 % — AB (ref 50–90)
Neutrophil, Synovial: 94 % — ABNORMAL HIGH (ref 0–25)
WBC, SYNOVIAL: 15720 /mm3 — AB (ref 0–200)

## 2015-07-28 LAB — POCT I-STAT 4, (NA,K, GLUC, HGB,HCT)
Glucose, Bld: 91 mg/dL (ref 65–99)
HCT: 38 % — ABNORMAL LOW (ref 39.0–52.0)
HEMOGLOBIN: 12.9 g/dL — AB (ref 13.0–17.0)
POTASSIUM: 3.1 mmol/L — AB (ref 3.5–5.1)
Sodium: 138 mmol/L (ref 135–145)

## 2015-07-28 LAB — BASIC METABOLIC PANEL
Anion gap: 8 (ref 5–15)
BUN: 10 mg/dL (ref 6–20)
CHLORIDE: 104 mmol/L (ref 101–111)
CO2: 23 mmol/L (ref 22–32)
Calcium: 8.9 mg/dL (ref 8.9–10.3)
Creatinine, Ser: 1.3 mg/dL — ABNORMAL HIGH (ref 0.61–1.24)
Glucose, Bld: 96 mg/dL (ref 65–99)
Potassium: 2.7 mmol/L — CL (ref 3.5–5.1)
SODIUM: 135 mmol/L (ref 135–145)

## 2015-07-28 LAB — SURGICAL PCR SCREEN
MRSA, PCR: POSITIVE — AB
Staphylococcus aureus: POSITIVE — AB

## 2015-07-28 LAB — PROTIME-INR
INR: 5.27 — AB (ref 0.00–1.49)
PROTHROMBIN TIME: 47 s — AB (ref 11.6–15.2)

## 2015-07-28 LAB — I-STAT CG4 LACTIC ACID, ED: Lactic Acid, Venous: 0.94 mmol/L (ref 0.5–2.0)

## 2015-07-28 LAB — MAGNESIUM: MAGNESIUM: 1.7 mg/dL (ref 1.7–2.4)

## 2015-07-28 LAB — SEDIMENTATION RATE: SED RATE: 21 mm/h — AB (ref 0–16)

## 2015-07-28 SURGERY — IRRIGATION AND DEBRIDEMENT EXTREMITY
Anesthesia: General | Laterality: Right

## 2015-07-28 MED ORDER — MIDAZOLAM HCL 5 MG/5ML IJ SOLN
INTRAMUSCULAR | Status: DC | PRN
  Administered 2015-07-28: 2 mg via INTRAVENOUS

## 2015-07-28 MED ORDER — LIDOCAINE HCL (CARDIAC) 20 MG/ML IV SOLN
INTRAVENOUS | Status: DC | PRN
  Administered 2015-07-28: 60 mg via INTRAVENOUS

## 2015-07-28 MED ORDER — PROPOFOL 10 MG/ML IV BOLUS
INTRAVENOUS | Status: AC
  Filled 2015-07-28: qty 20

## 2015-07-28 MED ORDER — ALBUMIN HUMAN 5 % IV SOLN
12.5000 g | INTRAVENOUS | Status: AC | PRN
  Administered 2015-07-28 (×2): 12.5 g via INTRAVENOUS

## 2015-07-28 MED ORDER — LACTATED RINGERS IV SOLN
INTRAVENOUS | Status: DC | PRN
  Administered 2015-07-28 (×2): via INTRAVENOUS

## 2015-07-28 MED ORDER — POTASSIUM CHLORIDE 10 MEQ/100ML IV SOLN
10.0000 meq | Freq: Once | INTRAVENOUS | Status: AC
  Administered 2015-07-28: 10 meq via INTRAVENOUS
  Filled 2015-07-28: qty 100

## 2015-07-28 MED ORDER — LACTATED RINGERS IV BOLUS (SEPSIS)
1000.0000 mL | Freq: Once | INTRAVENOUS | Status: AC
  Administered 2015-07-28: 1000 mL via INTRAVENOUS

## 2015-07-28 MED ORDER — LIDOCAINE HCL (CARDIAC) 20 MG/ML IV SOLN
INTRAVENOUS | Status: AC
  Filled 2015-07-28: qty 5

## 2015-07-28 MED ORDER — KETOROLAC TROMETHAMINE 60 MG/2ML IM SOLN
60.0000 mg | Freq: Once | INTRAMUSCULAR | Status: AC
  Administered 2015-07-28: 60 mg via INTRAMUSCULAR
  Filled 2015-07-28: qty 2

## 2015-07-28 MED ORDER — SODIUM CHLORIDE 0.9 % IR SOLN
Status: DC | PRN
  Administered 2015-07-28: 1000 mL

## 2015-07-28 MED ORDER — ALBUTEROL SULFATE HFA 108 (90 BASE) MCG/ACT IN AERS: 2.0000 | INHALATION_SPRAY | Freq: Four times a day (QID) | RESPIRATORY_TRACT | Status: DC | PRN

## 2015-07-28 MED ORDER — CITALOPRAM HYDROBROMIDE 20 MG PO TABS
40.0000 mg | ORAL_TABLET | Freq: Every day | ORAL | Status: DC
  Administered 2015-07-29 – 2015-07-30 (×2): 40 mg via ORAL
  Filled 2015-07-28 (×2): qty 2

## 2015-07-28 MED ORDER — WARFARIN - PHARMACIST DOSING INPATIENT: Freq: Every day | Status: DC

## 2015-07-28 MED ORDER — FENTANYL CITRATE (PF) 250 MCG/5ML IJ SOLN
INTRAMUSCULAR | Status: AC
  Filled 2015-07-28: qty 5

## 2015-07-28 MED ORDER — ALBUTEROL SULFATE (2.5 MG/3ML) 0.083% IN NEBU: 2.5000 mg | INHALATION_SOLUTION | Freq: Four times a day (QID) | RESPIRATORY_TRACT | Status: DC | PRN

## 2015-07-28 MED ORDER — HYDROCODONE-ACETAMINOPHEN 5-325 MG PO TABS
1.0000 | ORAL_TABLET | ORAL | Status: DC | PRN
  Administered 2015-07-28: 2 via ORAL
  Filled 2015-07-28: qty 2

## 2015-07-28 MED ORDER — FLUTICASONE PROPIONATE 50 MCG/ACT NA SUSP
2.0000 | Freq: Every day | NASAL | Status: DC
  Administered 2015-07-30: 2 via NASAL
  Filled 2015-07-28: qty 16

## 2015-07-28 MED ORDER — WHITE PETROLATUM GEL
Status: AC
  Administered 2015-07-28: 0.2
  Filled 2015-07-28: qty 1

## 2015-07-28 MED ORDER — ALBUMIN HUMAN 5 % IV SOLN
INTRAVENOUS | Status: AC
  Administered 2015-07-28: 20:00:00
  Filled 2015-07-28: qty 250

## 2015-07-28 MED ORDER — BUPIVACAINE HCL (PF) 0.25 % IJ SOLN
INTRAMUSCULAR | Status: DC | PRN
  Administered 2015-07-28: 10 mL

## 2015-07-28 MED ORDER — VANCOMYCIN HCL IN DEXTROSE 1-5 GM/200ML-% IV SOLN: 1000.0000 mg | Freq: Once | INTRAVENOUS | Status: DC

## 2015-07-28 MED ORDER — LISINOPRIL 20 MG PO TABS
20.0000 mg | ORAL_TABLET | Freq: Every day | ORAL | Status: DC
  Administered 2015-07-28 – 2015-07-30 (×3): 20 mg via ORAL
  Filled 2015-07-28 (×3): qty 1

## 2015-07-28 MED ORDER — DOLUTEGRAVIR SODIUM 50 MG PO TABS
50.0000 mg | ORAL_TABLET | Freq: Every day | ORAL | Status: DC
  Administered 2015-07-29 – 2015-07-30 (×2): 50 mg via ORAL
  Filled 2015-07-28 (×3): qty 1

## 2015-07-28 MED ORDER — PROPOFOL 10 MG/ML IV BOLUS
INTRAVENOUS | Status: DC | PRN
  Administered 2015-07-28: 180 mg via INTRAVENOUS

## 2015-07-28 MED ORDER — BUDESONIDE 0.5 MG/2ML IN SUSP
0.2500 mg | Freq: Two times a day (BID) | RESPIRATORY_TRACT | Status: DC
  Administered 2015-07-29 (×2): 0.25 mg via RESPIRATORY_TRACT
  Filled 2015-07-28 (×5): qty 2

## 2015-07-28 MED ORDER — FENTANYL CITRATE (PF) 100 MCG/2ML IJ SOLN
INTRAMUSCULAR | Status: DC | PRN
  Administered 2015-07-28 (×2): 50 ug via INTRAVENOUS

## 2015-07-28 MED ORDER — PHENYLEPHRINE 40 MCG/ML (10ML) SYRINGE FOR IV PUSH (FOR BLOOD PRESSURE SUPPORT)
PREFILLED_SYRINGE | INTRAVENOUS | Status: AC
  Filled 2015-07-28: qty 10

## 2015-07-28 MED ORDER — FENTANYL CITRATE (PF) 100 MCG/2ML IJ SOLN
25.0000 ug | INTRAMUSCULAR | Status: DC | PRN
  Administered 2015-07-28: 50 ug via INTRAVENOUS

## 2015-07-28 MED ORDER — CIPROFLOXACIN IN D5W 400 MG/200ML IV SOLN
400.0000 mg | Freq: Two times a day (BID) | INTRAVENOUS | Status: DC
  Filled 2015-07-28 (×2): qty 200

## 2015-07-28 MED ORDER — VANCOMYCIN HCL IN DEXTROSE 1-5 GM/200ML-% IV SOLN
1000.0000 mg | Freq: Once | INTRAVENOUS | Status: AC
  Administered 2015-07-28: 1000 mg via INTRAVENOUS
  Filled 2015-07-28: qty 200

## 2015-07-28 MED ORDER — ONDANSETRON HCL 4 MG/2ML IJ SOLN: 4.0000 mg | Freq: Once | INTRAMUSCULAR | Status: DC | PRN

## 2015-07-28 MED ORDER — OXYCODONE-ACETAMINOPHEN 5-325 MG PO TABS
2.0000 | ORAL_TABLET | Freq: Once | ORAL | Status: AC
  Administered 2015-07-28: 2 via ORAL
  Filled 2015-07-28: qty 2

## 2015-07-28 MED ORDER — BUPIVACAINE HCL (PF) 0.25 % IJ SOLN
INTRAMUSCULAR | Status: AC
  Filled 2015-07-28: qty 30

## 2015-07-28 MED ORDER — VANCOMYCIN HCL IN DEXTROSE 750-5 MG/150ML-% IV SOLN
750.0000 mg | Freq: Once | INTRAVENOUS | Status: AC
  Administered 2015-07-28: 750 mg via INTRAVENOUS
  Filled 2015-07-28: qty 150

## 2015-07-28 MED ORDER — OXYCODONE HCL 5 MG/5ML PO SOLN: 5.0000 mg | Freq: Once | ORAL | Status: DC | PRN

## 2015-07-28 MED ORDER — EMTRICITABINE-TENOFOVIR DF 200-300 MG PO TABS
1.0000 | ORAL_TABLET | Freq: Every day | ORAL | Status: DC
  Administered 2015-07-29 – 2015-07-30 (×2): 1 via ORAL
  Filled 2015-07-28 (×2): qty 1

## 2015-07-28 MED ORDER — POTASSIUM CHLORIDE CRYS ER 20 MEQ PO TBCR
80.0000 meq | EXTENDED_RELEASE_TABLET | Freq: Once | ORAL | Status: AC
  Administered 2015-07-28: 80 meq via ORAL
  Filled 2015-07-28: qty 4

## 2015-07-28 MED ORDER — LACTATED RINGERS IV SOLN: INTRAVENOUS | Status: DC

## 2015-07-28 MED ORDER — MIDAZOLAM HCL 2 MG/2ML IJ SOLN
INTRAMUSCULAR | Status: AC
  Filled 2015-07-28: qty 4

## 2015-07-28 MED ORDER — LEVOFLOXACIN IN D5W 750 MG/150ML IV SOLN
750.0000 mg | Freq: Once | INTRAVENOUS | Status: AC
  Administered 2015-07-28: 750 mg via INTRAVENOUS
  Filled 2015-07-28: qty 150

## 2015-07-28 MED ORDER — FENTANYL CITRATE (PF) 100 MCG/2ML IJ SOLN
INTRAMUSCULAR | Status: AC
  Administered 2015-07-28: 20:00:00
  Filled 2015-07-28: qty 2

## 2015-07-28 MED ORDER — OXYCODONE HCL 5 MG PO TABS: 5.0000 mg | ORAL_TABLET | Freq: Once | ORAL | Status: DC | PRN

## 2015-07-28 MED ORDER — POTASSIUM CHLORIDE 10 MEQ/100ML IV SOLN
10.0000 meq | INTRAVENOUS | Status: AC
  Administered 2015-07-28 (×3): 10 meq via INTRAVENOUS
  Filled 2015-07-28 (×3): qty 100

## 2015-07-28 MED ORDER — VANCOMYCIN HCL IN DEXTROSE 1-5 GM/200ML-% IV SOLN
1000.0000 mg | Freq: Two times a day (BID) | INTRAVENOUS | Status: DC
  Administered 2015-07-28 – 2015-07-29 (×2): 1000 mg via INTRAVENOUS
  Filled 2015-07-28 (×3): qty 200

## 2015-07-28 MED ORDER — PHENYLEPHRINE HCL 10 MG/ML IJ SOLN
INTRAMUSCULAR | Status: DC | PRN
  Administered 2015-07-28: 80 ug via INTRAVENOUS

## 2015-07-28 MED ORDER — LIDOCAINE-EPINEPHRINE (PF) 2 %-1:200000 IJ SOLN
20.0000 mL | Freq: Once | INTRAMUSCULAR | Status: AC
  Administered 2015-07-28: 20 mL
  Filled 2015-07-28: qty 20

## 2015-07-28 MED ORDER — SODIUM CHLORIDE 0.9 % IV SOLN
INTRAVENOUS | Status: DC
  Administered 2015-07-28 (×2): via INTRAVENOUS

## 2015-07-28 SURGICAL SUPPLY — 42 items
BAG DECANTER FOR FLEXI CONT (MISCELLANEOUS) IMPLANT
BANDAGE ELASTIC 3 VELCRO ST LF (GAUZE/BANDAGES/DRESSINGS) ×3 IMPLANT
BANDAGE ELASTIC 4 VELCRO ST LF (GAUZE/BANDAGES/DRESSINGS) ×3 IMPLANT
BNDG GAUZE ELAST 4 BULKY (GAUZE/BANDAGES/DRESSINGS) ×3 IMPLANT
CORDS BIPOLAR (ELECTRODE) ×3 IMPLANT
CUFF TOURNIQUET SINGLE 18IN (TOURNIQUET CUFF) IMPLANT
DRAPE SURG 17X23 STRL (DRAPES) ×3 IMPLANT
ELECT REM PT RETURN 9FT ADLT (ELECTROSURGICAL) ×3
ELECTRODE REM PT RTRN 9FT ADLT (ELECTROSURGICAL) ×1 IMPLANT
GAUZE PACKING IODOFORM 1/4X5 (PACKING) IMPLANT
GAUZE SPONGE 4X4 12PLY STRL (GAUZE/BANDAGES/DRESSINGS) ×3 IMPLANT
GAUZE XEROFORM 1X8 LF (GAUZE/BANDAGES/DRESSINGS) ×3 IMPLANT
GLOVE BIOGEL M STRL SZ7.5 (GLOVE) IMPLANT
GLOVE BIOGEL PI IND STRL 6.5 (GLOVE) IMPLANT
GLOVE BIOGEL PI IND STRL 7.5 (GLOVE) ×1 IMPLANT
GLOVE BIOGEL PI INDICATOR 6.5 (GLOVE)
GLOVE BIOGEL PI INDICATOR 7.5 (GLOVE) ×2
GOWN STRL REUS W/ TWL LRG LVL3 (GOWN DISPOSABLE) ×2 IMPLANT
GOWN STRL REUS W/TWL LRG LVL3 (GOWN DISPOSABLE) ×4
HANDPIECE INTERPULSE COAX TIP (DISPOSABLE)
IV CATH 14GX2 1/4 (CATHETERS) ×6 IMPLANT
KIT BASIN OR (CUSTOM PROCEDURE TRAY) ×3 IMPLANT
KIT ROOM TURNOVER OR (KITS) ×3 IMPLANT
MANIFOLD NEPTUNE II (INSTRUMENTS) ×3 IMPLANT
NEEDLE HYPO 25GX1X1/2 BEV (NEEDLE) ×3 IMPLANT
NS IRRIG 1000ML POUR BTL (IV SOLUTION) ×3 IMPLANT
PACK ORTHO EXTREMITY (CUSTOM PROCEDURE TRAY) ×3 IMPLANT
PAD ARMBOARD 7.5X6 YLW CONV (MISCELLANEOUS) ×6 IMPLANT
PAD CAST 4YDX4 CTTN HI CHSV (CAST SUPPLIES) IMPLANT
PADDING CAST COTTON 4X4 STRL (CAST SUPPLIES)
SET HNDPC FAN SPRY TIP SCT (DISPOSABLE) IMPLANT
SOAP 2 % CHG 4 OZ (WOUND CARE) ×3 IMPLANT
SPONGE LAP 18X18 X RAY DECT (DISPOSABLE) IMPLANT
SPONGE LAP 4X18 X RAY DECT (DISPOSABLE) ×3 IMPLANT
SYR CONTROL 10ML LL (SYRINGE) ×6 IMPLANT
TOWEL OR 17X24 6PK STRL BLUE (TOWEL DISPOSABLE) ×3 IMPLANT
TOWEL OR 17X26 10 PK STRL BLUE (TOWEL DISPOSABLE) ×3 IMPLANT
TUBE ANAEROBIC SPECIMEN COL (MISCELLANEOUS) IMPLANT
TUBE CONNECTING 12'X1/4 (SUCTIONS) ×1
TUBE CONNECTING 12X1/4 (SUCTIONS) ×2 IMPLANT
WATER STERILE IRR 1000ML POUR (IV SOLUTION) ×3 IMPLANT
YANKAUER SUCT BULB TIP NO VENT (SUCTIONS) ×3 IMPLANT

## 2015-07-28 NOTE — Anesthesia Postprocedure Evaluation (Signed)
  Anesthesia Post-op Note  Patient: Craig [name]  Procedure(s) Performed: Procedure(s): IRRIGATION AND DEBRIDEMENT WRIST (Right)  Patient Location: PACU  Anesthesia Type:General  Level of Consciousness: awake, alert  and oriented  Airway and Oxygen Therapy: Patient Spontanous Breathing and Patient connected to nasal cannula oxygen  Post-op Pain: mild  Post-op Assessment: Post-op Vital signs reviewed, Patient's Cardiovascular Status Stable, Respiratory Function Stable, Patent Airway and Pain level controlled              Post-op Vital Signs: stable  Last Vitals:  Filed Vitals:   07/28/15 1349  BP: 121/84  Pulse: 86  Temp: 36.4 C  Resp: 18    Complications: No apparent anesthesia complications

## 2015-07-28 NOTE — H&P (Signed)
Date: 07/28/2015               Patient Name:  Craig [name] MRN: [id number]  DOB: 20-Aug-1961 Age / Sex: 54 y.o., male   PCP: Toni Arthurs, PA         Medical Service: Internal Medicine Teaching Service         Attending Physician: Dr. Annia Belt, MD    First Contact: Dr. Burgess Estelle Pager: [phone]  Second Contact: Dr. Joni Reining Pager: [phone]       After Hours (After 5p/  First Contact Pager: [phone]  weekends / holidays): Second Contact Pager: [phone]   Chief Complaint: right swollen wrist  History of Present Illness: Craig [name] is a 54 yo man with PMH of gout, HIV on HAART, chronic eczema, HTN, and history of DVT, and arthritis who presents with pain and swelling of his right wrist for about 2 days. The pain when he initially came in was about 10/10 and throbbing pain, but upon bedside exam, it was 8/10 after the ankle joint was aspirated by ortho. It became progressively large and hot and tender.   He was also complaining of diarrhea for about 2 weeks and attributes it to a viral bug- diarrhea 10 times a day last week, but since this week about 1-2 times a day. He says it is improving  He denies fevers, chills, CP, SOB, change in appetite, weight change, nausea, or vomiting, blood in stool, dizziness,   He does have a history of gout- episodes about twice a year and happens mainly in the knees. 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 He does not follow up regularly but last week he was told to hold it for few days.   His mother was present in the room when examined and patient was comfortable with that. He does not smoke, drink, or do illegal drugs. Last sexual encounter was 6 months ago.   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   Home Meds:   Medication List    ASK your doctor about these medications        albuterol 108 (90 BASE) MCG/ACT inhaler  Commonly known as:  PROVENTIL HFA;VENTOLIN HFA  Inhale 2 puffs into the lungs every 6 (six) hours as needed for wheezing or shortness of breath.     beclomethasone 40 MCG/ACT inhaler  Commonly known as:  QVAR  Inhale 1 puff into the lungs 2 (two) times daily as needed (shortness of breath).     budesonide 32 MCG/ACT nasal spray  Commonly known as:  RHINOCORT AQUA  Place 2 sprays into both nostrils daily.     citalopram 40 MG tablet  Commonly known as:  CELEXA  Take 40 mg by mouth daily.     clobetasol ointment 0.05 %  Commonly known as:  TEMOVATE  Apply 1 application topically 2 (two) times daily.     colchicine 0.6 MG tablet  Take 0.6 mg by mouth 2 (two) times daily.     dolutegravir 50 MG tablet  Commonly known as:  TIVICAY  Take 1 tablet (50 mg total) by mouth daily.     emtricitabine-tenofovir 200-300  MG per tablet  Commonly known as:  TRUVADA  Take 1 tablet by mouth daily.     hydrOXYzine 25 MG tablet  Commonly known as:  ATARAX/VISTARIL  Take 25 mg by mouth every 6 (six) hours as needed for anxiety.     imiquimod 5 % cream  Commonly known as:  ALDARA  Apply topically 3 (three) times a week.     lisinopril 20 MG tablet  Commonly known as:  PRINIVIL,ZESTRIL  Take 1 tablet (20 mg total) by mouth daily.     triamcinolone ointment 0.1 %  Commonly known as:  KENALOG  Apply 1 application topically 2 (two) times daily as needed (for itching). On Face     warfarin 5 MG tablet  Commonly known as:  COUMADIN  Take 5-7.5 mg by mouth daily. M and Th take 1 1/2 tab (7.58m) all others days take 1 tab        Current Facility-Administered Medications  Medication Dose Route Frequency Provider Last Rate Last Dose  . 0.9 %  sodium chloride infusion   Intravenous  Continuous EBethena Roys MD 125 mL/hr at 07/28/15 1632    . [MAR Hold] albuterol (PROVENTIL) (2.5 MG/3ML) 0.083% nebulizer solution 2.5 mg  2.5 mg Nebulization Q6H PRN JAnnia Belt MD      . [Doug SouHold] budesonide (PULMICORT) nebulizer solution 0.25 mg  0.25 mg Nebulization BID Ejiroghene EArlyce Dice MD      . [Doug SouHold] citalopram (CELEXA) tablet 40 mg  40 mg Oral Daily Ejiroghene EArlyce Dice MD   40 mg at 07/28/15 1631  . [MAR Hold] dolutegravir (TIVICAY) tablet 50 mg  50 mg Oral Daily Ejiroghene E Emokpae, MD   50 mg at 07/28/15 1631  . [MAR Hold] emtricitabine-tenofovir (TRUVADA) 200-300 MG per tablet 1 tablet  1 tablet Oral Daily Ejiroghene EArlyce Dice MD   1 tablet at 07/28/15 1630  . [MAR Hold] fluticasone (FLONASE) 50 MCG/ACT nasal spray 2 spray  2 spray Each Nare Daily Ejiroghene E Emokpae, MD      . lactated ringers infusion   Intravenous Continuous CFinis Bud MD      . levofloxacin (LEVAQUIN) IVPB 750 mg  750 mg Intravenous Once Hannah Muthersbaugh, PA-C   750 mg at 07/28/15 1718  . [MAR Hold] lisinopril (PRINIVIL,ZESTRIL) tablet 20 mg  20 mg Oral Daily Ejiroghene E Emokpae, MD   20 mg at 07/28/15 1722  . [facility] Warfarin - Pharmacist Dosing Inpatient   Does not apply qDortches RSmith Villageat 07/28/15 1705    Allergies: Allergies as of 07/28/2015 - Review Complete 07/28/2015  Allergen Reaction Noted  . Shellfish allergy Shortness Of Breath 02/26/2014  . Penicillins Hives 02/26/2014  . Latex Rash 02/26/2014   Past Medical History  Diagnosis Date  . 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 Personal history of venous thrombosis and embolism   . Dysplasia of anus   . Esophageal reflux   . Horseshoe tear of retina without detachment 02-13-12  . 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  . Molluscum contagiosum 09-29-2012  . 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 infarction   . Other convulsions   . Unspecified hereditary and idiopathic peripheral neuropathy   . Secondary iridocyclitis,  infectious   . 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 PE (pulmonary embolism) 04/09/2013    Overview:  Jan 2012   . 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 Hernia, inguinal 04/09/2013  . 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 Deep vein thrombosis  05/09/2011    Overview:  Jan 2012   . AIDS 05/24/2015  . Shortness of breath dyspnea   . Septic arthritis of wrist     rt  . HIV (human immunodeficiency virus infection)    Past Surgical History  Procedure Laterality Date  . Colonoscopy     History reviewed. No pertinent family history. History   Social History  . Marital Status: Single    Spouse Name: N/A  . Number of Children: N/A  . Years of Education: N/A   Occupational History  . Not on file.   Social History Main Topics  . Smoking status: Never Smoker   . Smokeless tobacco: Never Used  . Alcohol Use: No  . Drug Use: No  . Sexual Activity:    Partners: Male    Birth Control/ Protection: Condom   Other Topics Concern  . Not on file   Social History Narrative    Review of Systems: Complete ROS was obtained and pertinent are listed in the HPI.   Physical Exam: Blood pressure 121/84, pulse 86, temperature 97.5 F (36.4 C), temperature source Oral, resp. rate 18, height 6' (1.829 m), weight 235 lb (106.595 kg), SpO2 98 %.  General: A&O, in NAD HEENT: EOMI,  Neck: supple, midline trachea, CV: RRR, normal s1, s2, no m/r/g, no carotid bruits appreciated, no JVD appreciated Resp: equal and symmetric breath sounds, no wheezing heard Abdomen: soft, nontender, nondistended, +BS in all 4 quadrants, had hiatal hernia?- not able to confirm GU: no CVA tenderness Skin: warm, dry, intact, no open lesions or rashes noted Extremities: pulses intact b/l, no edema, clubbing or cyanosis, no calf tenderness Right wrist- tenderness to palpation, some redness, and swelling noted- the joint was just aspirated when I examined him He  had some bruising probably due to coumadin   Lab results: _0 @ CBC Latest Ref Rng 07/28/2015 05/10/2015 04/05/2015  WBC 4.0 - 10.5 K/uL 10.2 4.6 5.0  Hemoglobin 13.0 - 17.0 g/dL 14.8 14.8 14.8  Hematocrit 39.0 - 52.0 % 43.4 45.1 44.8  Platelets 150 - 400 K/uL 260 199 195    BMP Latest Ref Rng 07/28/2015 05/10/2015 04/05/2015  Glucose 65 - 99 mg/dL 96 136(H) 99  BUN 6 - 20 mg/dL _1 Creatinine 0.61 - 1.24 mg/dL 1.30(H) 1.21 1.28  Sodium 135 - 145 mmol/L 135 140 138  Potassium 3.5 - 5.1 mmol/L 2.7(LL) 3.9 4.2  Chloride 101 - 111 mmol/L 104 102 104  CO2 22 - 32 mmol/L _2 Calcium 8.9 - 10.3 mg/dL 8.9 8.8 9.3   .  Imaging results:  Dg Wrist Complete Right  07/28/2015   CLINICAL DATA:  Pain for 1 day.  No recent trauma.  History of gout  EXAM: RIGHT WRIST - COMPLETE 3+ VIEW  COMPARISON:  July 07, 2012  FINDINGS: Frontal, oblique, lateral, and ulnar deviation scaphoid images were obtained. There is no demonstrable fracture or dislocation. Joint spaces appear intact. No erosive change or intra-articular calcification.  IMPRESSION: No fracture or dislocation.  No appreciable arthropathy.   Electronically Signed   By: Lowella Grip III M.D.   On: 07/28/2015 09:53    Other results: EKG: normal EKG, normal sinus rhythm, unchanged from previous tracings.  Assessment & Plan by Problem: Active Problems:   Septic arthritis   Arthritis 54 yo man with PMH of HIV, gout, HTN, and DVT presents with 2 day history of swelling of right ankle   Wrist swelling: Differentials include Gout  vs. Septic arthritis.  Gout more likely as he has a history of gout and afebrile, and sudden onset. Synovial cell count showed 15720 with high neutrophils of 94%,  There were no crystals seen , culture pending  ESR was 21 . Xray showed no fracture or arthropathy. Septic arthritis:  No white count (10.1 but his baseline WC is 3-4- but the neutrophil % was 94%  -admit to med/surg -vanc and cipro 400 mg  q12H to cover for septic arthritis, pt allergic to penicillin so cipro is an alternative  -f/u cultures -ortho consulted in ED, recs appreciated - had I&D today    Hypokalemia:Likely due to a week history of diarrhea and GI loss, likely appears to be resolving  -On NS 125 cc/hr  -Received 120 meQ K   Diarrhea:Per pt, seems to be resolving,  2 day history of loose watery stools, with HIV history and cd4 550, concern for infections He was seen on 7/27 at [facility]- checked c diff, gi pathogen panel. Given imodium and asked to have diet as tolerated Had cryptosporidium and lactoferrin on the stool culture on 7/27  HTN:  stable on lisinopril 20 mg   HIV: CD4 550, VL 20 On Truvada  DVT: on coumadin per pharmacy Holding coumadin as INR 5.2   Asthma- On proventil and pulmicort   Depression- on celexa  #FEN:  -Diet: carb modified  #DVT prophylaxis: holding due to INR 5.2  #CODE STATUS: full     Dispo: Disposition is deferred at this time, awaiting improvement of current medical problems. Anticipated discharge in approximately 1-3 day(s).   The patient does have a current PCP Toni Arthurs, [place]) and does need an [facility] hospital follow-up appointment after discharge.  The patient does not have transportation limitations that hinder transportation to clinic appointments.  Signed: Burgess Estelle, MD 07/28/2015, 6:27 PM

## 2015-07-28 NOTE — Consult Note (Signed)
Reason for Consult:? R wrist infection Referring Physician: ER  CC:My wrist hurts  HPI:  Craig [name] is an 54 y.o. right handed male who presents with  Painful, swollen R wrist for several days.      .   Pain is rated at   8 /10 and is described as sharp.  Pain is constant.  Pain is made better by rest/immobilization, worse with motion.   Associated signs/symptoms: as above, h/o gout Previous treatment:    Past Medical History  Diagnosis Date  . 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 Personal history of venous thrombosis and embolism   . Dysplasia of anus   . Esophageal reflux   . Horseshoe tear of retina without detachment 02-13-12  . 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  . Molluscum contagiosum 09-29-2012  . 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 infarction   . Other convulsions   . Unspecified hereditary and idiopathic peripheral neuropathy   . Secondary iridocyclitis, infectious   . 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 PE (pulmonary embolism) 04/09/2013    Overview:  Jan 2012   . 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 Hernia, inguinal 04/09/2013  . 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 Deep vein thrombosis 05/09/2011    Overview:  Jan 2012   . AIDS 05/24/2015  . Shortness of breath dyspnea   . Septic arthritis of wrist     rt  . HIV (human immunodeficiency virus infection)     Past Surgical History  Procedure Laterality Date  . Colonoscopy      History reviewed. No pertinent family history.  Social History:  reports that he has never smoked. He has never used smokeless tobacco. He reports that he does not drink alcohol or use illicit drugs.  Allergies:  Allergies  Allergen Reactions  . Shellfish Allergy Shortness Of Breath  . Penicillins Hives  . Latex Rash    Medications: I have reviewed the patient's current medications.  Results for orders placed or performed during the hospital encounter of 07/28/15 (from the past 48 hour(s))  CBC with  Differential/Platelet     Status: None   Collection Time: 07/28/15 10:50 AM  Result Value Ref Range   WBC 10.2 4.0 - 10.5 K/uL   RBC 5.12 4.22 - 5.81 MIL/uL   Hemoglobin 14.8 13.0 - 17.0 g/dL   HCT 26.7 42.2 - 84.2 %   MCV 84.8 78.0 - 100.0 fL   MCH 28.9 26.0 - 34.0 pg   MCHC 34.1 30.0 - 36.0 g/dL   RDW 54.8 61.3 - 98.1 %   Platelets 260 150 - 400 K/uL   Neutrophils Relative % 75 43 - 77 %   Neutro Abs 7.6 1.7 - 7.7 K/uL   Lymphocytes Relative 15 12 - 46 %   Lymphs Abs 1.5 0.7 - 4.0 K/uL   Monocytes Relative 9 3 - 12 %   Monocytes Absolute 1.0 0.1 - 1.0 K/uL   Eosinophils Relative 1 0 - 5 %   Eosinophils Absolute 0.1 0.0 - 0.7 K/uL   Basophils Relative 0 0 - 1 %   Basophils Absolute 0.0 0.0 - 0.1 K/uL  Protime-INR     Status: Abnormal   Collection Time: 07/28/15 10:50 AM  Result Value Ref Range   Prothrombin Time 47.0 (H) 11.6 - 15.2 seconds   INR 5.27 (HH) 0.00 - 1.49    Comment: REPEATED TO VERIFY CRITICAL RESULT CALLED TO, READ BACK BY AND VERIFIED WITH: TODD GROSE,RN AT 1216 07/28/15 BY K Teola Bradley  Basic metabolic panel     Status: Abnormal   Collection Time: 07/28/15 10:50 AM  Result Value Ref Range   Sodium 135 135 - 145 mmol/L   Potassium 2.7 (LL) 3.5 - 5.1 mmol/L    Comment: REPEATED TO VERIFY CRITICAL RESULT CALLED TO, READ BACK BY AND VERIFIED WITH: A.MCKEOWN,RN 1139 07/28/15 CLARK,S    Chloride 104 101 - 111 mmol/L   CO2 23 22 - 32 mmol/L   Glucose, Bld 96 65 - 99 mg/dL   BUN 10 6 - 20 mg/dL   Creatinine, Ser 1.30 (H) 0.61 - 1.24 mg/dL   Calcium 8.9 8.9 - 10.3 mg/dL   GFR calc non Af Amer >60 >60 mL/min   GFR calc Af Amer >60 >60 mL/min    Comment: (NOTE) The eGFR has been calculated using the CKD EPI equation. This calculation has not been validated in all clinical situations. eGFR's persistently <60 mL/min signify possible Chronic Kidney Disease.    Anion gap 8 5 - 15  Sedimentation rate     Status: Abnormal   Collection Time: 07/28/15 10:50 AM  Result  Value Ref Range   Sed Rate 21 (H) 0 - 16 mm/hr  I-Stat CG4 Lactic Acid, ED     Status: None   Collection Time: 07/28/15 11:17 AM  Result Value Ref Range   Lactic Acid, Venous 0.94 0.5 - 2.0 mmol/L  Synovial cell count + diff, w/ crystals     Status: Abnormal   Collection Time: 07/28/15 11:52 AM  Result Value Ref Range   Color, Synovial YELLOW YELLOW   Appearance-Synovial CLOUDY (A) CLEAR   Crystals, Fluid NO CRYSTALS SEEN    WBC, Synovial 15720 (H) 0 - 200 /cu mm   Neutrophil, Synovial 94 (H) 0 - 25 %   Monocyte-Macrophage-Synovial Fluid 6 (L) 50 - 90 %    Dg Wrist Complete Right  07/28/2015   CLINICAL DATA:  Pain for 1 day.  No recent trauma.  History of gout  EXAM: RIGHT WRIST - COMPLETE 3+ VIEW  COMPARISON:  July 07, 2012  FINDINGS: Frontal, oblique, lateral, and ulnar deviation scaphoid images were obtained. There is no demonstrable fracture or dislocation. Joint spaces appear intact. No erosive change or intra-articular calcification.  IMPRESSION: No fracture or dislocation.  No appreciable arthropathy.   Electronically Signed   By: Lowella Grip III M.D.   On: 07/28/2015 09:53    Pertinent items are noted in HPI. Temp:  [97.5 F (36.4 C)-98.7 F (37.1 C)] 97.5 F (36.4 C) (08/04 1349) Pulse Rate:  [86-102] 86 (08/04 1349) Resp:  [12-18] 18 (08/04 1349) BP: (97-154)/(63-94) 121/84 mmHg (08/04 1349) SpO2:  [93 %-99 %] 98 % (08/04 1349) Weight:  [106.595 kg (235 lb)] 106.595 kg (235 lb) (08/04 0839) General appearance: alert and cooperative Extremities: R wrist swollen, erythema present dorsally, hurts to move joint   Assessment: R Wrist ?septic arthritis Plan: Wrist aspirated sterilly, will send off, ? OR  I have discussed this treatment plan in detail with patient, including the risks of the recommended treatment or surgery, the benefits and the alternatives.  The patient and/or caregiver understands that additional treatment may be necessary.  Craig [name]  CHRISTOPHER 07/28/2015, 4:55 PM

## 2015-07-28 NOTE — Progress Notes (Signed)
Dr. Izora Ribas called and informed of elevated PT/INR, reported that he is aware, no new orders received. Per report from [facility] RN, Dr. Izora Ribas also aware of low potassium. Pt has received oral and IV replacement.

## 2015-07-28 NOTE — ED Notes (Signed)
Pt reports rt wrist swelling that started yesterday. Pt states that he has hx of gout and usually gets "pills for it". PMS intact.

## 2015-07-28 NOTE — ED Provider Notes (Signed)
Medical screening examination/treatment/procedure(s) were conducted as a shared visit with non-physician practitioner(s) and myself.  I personally evaluated the patient during the encounter.  I saw patient with APP and agree with the assessment.   Patient is presenting with swollen erythematous wrist. He is difficulty ranging secondary to pain. It is warm and erythematous. Concern for infected wrist versus gout. Given patient's level of immunosuppression and INR elevated. concern for infection or hemarthrosis or gout. We will consult hand surgery to evaluate and likely need admission for IV and abx    Shaundrea Carrigg Lyn Myah Guynes, MD 07/28/15 1444

## 2015-07-28 NOTE — Progress Notes (Signed)
Pt transferred to preop area for procedure via bed. He was alert and oriented times 4 on room air. Family at bedside. Telephone report called.

## 2015-07-28 NOTE — Anesthesia Preprocedure Evaluation (Signed)
Anesthesia Evaluation  Patient identified by MRN, date of birth, ID band Patient awake    Reviewed: Allergy & Precautions, NPO status , Patient's Chart, lab work & pertinent test results  Airway Mallampati: II  TM Distance: >3 FB Neck ROM: Full    Dental  (+) Edentulous Upper, Edentulous Lower   Pulmonary  breath sounds clear to auscultation        Cardiovascular Rhythm:Regular Rate:Normal     Neuro/Psych    GI/Hepatic   Endo/Other    Renal/GU      Musculoskeletal   Abdominal (+) - obese,   Peds  Hematology   Anesthesia Other Findings   Reproductive/Obstetrics                             Anesthesia Physical Anesthesia Plan  ASA: III  Anesthesia Plan: General   Post-op Pain Management:    Induction: Intravenous  Airway Management Planned: LMA  Additional Equipment:   Intra-op Plan:   Post-operative Plan:   Informed Consent: I have reviewed the patients History and Physical, chart, labs and discussed the procedure including the risks, benefits and alternatives for the proposed anesthesia with the patient or authorized representative who has indicated his/her understanding and acceptance.     Plan Discussed with: CRNA and Anesthesiologist  Anesthesia Plan Comments: (Septic arthritis R. Wrist H/O DVT/PE 2012 on coumadin INR 5.27 Hypertension HIV well controlled CD-4 530  Plan GA with LMA  Kipp Brood)        Anesthesia Quick Evaluation

## 2015-07-28 NOTE — Anesthesia Procedure Notes (Signed)
Procedure Name: LMA Insertion Date/Time: 07/28/2015 6:28 PM Performed by: Orvilla Fus A Pre-anesthesia Checklist: Patient identified, Emergency Drugs available, Suction available, Patient being monitored and Timeout performed Patient Re-evaluated:Patient Re-evaluated prior to inductionOxygen Delivery Method: Circle system utilized Preoxygenation: Pre-oxygenation with 100% oxygen Intubation Type: IV induction LMA: LMA inserted LMA Size: 5.0 Number of attempts: 1 Placement Confirmation: positive ETCO2 and breath sounds checked- equal and bilateral Tube secured with: Tape

## 2015-07-28 NOTE — Transfer of Care (Signed)
Immediate Anesthesia Transfer of Care Note  Patient: Craig [name]  Procedure(s) Performed: Procedure(s): IRRIGATION AND DEBRIDEMENT WRIST (Right)  Patient Location: PACU  Anesthesia Type:General  Level of Consciousness: sedated  Airway & Oxygen Therapy: Patient Spontanous Breathing and Patient connected to nasal cannula oxygen  Post-op Assessment: Report given to RN, Post -op Vital signs reviewed and stable and Patient moving all extremities  Post vital signs: unstable  Last Vitals:  Filed Vitals:   07/28/15 1349  BP: 121/84  Pulse: 86  Temp: 36.4 C  Resp: 18    Complications: BP 68/34 postop- Dr. Noreene Larsson notified. VS otherwise stable and spontaneously breathing.  Administering IV fluids and albumin.

## 2015-07-28 NOTE — ED Notes (Signed)
MD at bedside. 

## 2015-07-28 NOTE — Progress Notes (Addendum)
ANTIBIOTIC CONSULT NOTE - INITIAL  Pharmacy Consult for Cipro and Vancomycin Indication: septic arthritis  Allergies  Allergen Reactions  . Shellfish Allergy Shortness Of Breath  . Penicillins Hives  . Latex Rash    Patient Measurements: Height: 6' (182.9 cm) Weight: 235 lb (106.595 kg) IBW/kg (Calculated) : 77.6 Adjusted Body Weight: 90 kg  Vital Signs: Temp: 97.5 F (36.4 C) (08/04 1349) Temp Source: Oral (08/04 1349) BP: 121/84 mmHg (08/04 1349) Pulse Rate: 86 (08/04 1349) Intake/Output from previous day:   Intake/Output from this shift:    Labs:  Recent Labs  07/28/15 1050  WBC 10.2  HGB 14.8  PLT 260  CREATININE 1.30*   Estimated Creatinine Clearance: 83.9 mL/min (by C-G formula based on Cr of 1.3). No results for input(s): VANCOTROUGH, VANCOPEAK, VANCORANDOM, GENTTROUGH, GENTPEAK, GENTRANDOM, TOBRATROUGH, TOBRAPEAK, TOBRARND, AMIKACINPEAK, AMIKACINTROU, AMIKACIN in the last 72 hours.   Microbiology: No results found for this or any previous visit (from the past 720 hour(s)).  Medical History: Past Medical History  Diagnosis Date  . 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 Personal history of venous thrombosis and embolism   . Dysplasia of anus   . Esophageal reflux   . Horseshoe tear of retina without detachment 02-13-12  . 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  . Molluscum contagiosum 09-29-2012  . 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 infarction   . Other convulsions   . Unspecified hereditary and idiopathic peripheral neuropathy   . Secondary iridocyclitis, infectious   . 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 PE (pulmonary embolism) 04/09/2013    Overview:  Jan 2012   . 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 Hernia, inguinal 04/09/2013  . 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 Deep vein thrombosis 05/09/2011    Overview:  Jan 2012   . AIDS 05/24/2015  . Shortness of breath dyspnea   . Septic arthritis of wrist     rt  . HIV (human  immunodeficiency virus infection)     Medications:  Prescriptions prior to admission  Medication Sig Dispense Refill Last Dose  . albuterol (PROVENTIL HFA;VENTOLIN HFA) 108 (90 BASE) MCG/ACT inhaler Inhale 2 puffs into the lungs every 6 (six) hours as needed for wheezing or shortness of breath.   07/24/2015  . beclomethasone (QVAR) 40 MCG/ACT inhaler Inhale 1 puff into the lungs 2 (two) times daily as needed (shortness of breath).    07/24/2015  . citalopram (CELEXA) 40 MG tablet Take 40 mg by mouth daily.   07/28/2015 at Unknown time  . clobetasol ointment (TEMOVATE) 0.05 % Apply 1 application topically 2 (two) times daily.    07/27/2015 at Unknown time  . colchicine 0.6 MG tablet Take 0.6 mg by mouth 2 (two) times daily.  5 Past Month at Unknown time  . dolutegravir (TIVICAY) 50 MG tablet Take 1 tablet (50 mg total) by mouth daily. 30 tablet 11 07/28/2015 at Unknown time  . emtricitabine-tenofovir (TRUVADA) 200-300 MG per tablet Take 1 tablet by mouth daily. 30 tablet 11 07/28/2015 at Unknown time  . hydrOXYzine (ATARAX/VISTARIL) 25 MG tablet Take 25 mg by mouth every 6 (six) hours as needed for anxiety.    Past Week at Unknown time  . imiquimod (ALDARA) 5 % cream Apply topically 3 (three) times a week.   07/27/2015 at Unknown time  . lisinopril (PRINIVIL,ZESTRIL) 20 MG tablet Take 1 tablet (20 mg total) by mouth daily. 30 tablet 5 Past Month at Unknown time  . triamcinolone ointment (KENALOG) 0.1 % Apply 1 application topically 2 (two)  times daily as needed (for itching). On Face  0 07/27/2015 at Unknown time  . warfarin (COUMADIN) 5 MG tablet Take 5-7.5 mg by mouth daily. M and Th take 1 1/2 tab (7.5mg ) all others days take 1 tab   07/26/2015  . budesonide (RHINOCORT AQUA) 32 MCG/ACT nasal spray Place 2 sprays into both nostrils daily. 1 Bottle 0 07/24/2015  . [DISCONTINUED] triamcinolone ointment (KENALOG) 0.1 % Apply 1 application topically 2 (two) times daily as needed (for itching).    Not Taking at  Unknown time   Scheduled:  . [MAR Hold] budesonide (PULMICORT) nebulizer solution  0.25 mg Nebulization BID  . [MAR Hold] citalopram  40 mg Oral Daily  . [MAR Hold] dolutegravir  50 mg Oral Daily  . [MAR Hold] emtricitabine-tenofovir  1 tablet Oral Daily  . [MAR Hold] fluticasone  2 spray Each Nare Daily  . levofloxacin (LEVAQUIN) IV  750 mg Intravenous Once  . [MAR Hold] lisinopril  20 mg Oral Daily  . [MAR Hold] Warfarin - Pharmacist Dosing Inpatient   Does not apply q1800   Infusions:  . sodium chloride 125 mL/hr at 07/28/15 1632  . lactated ringers     Assessment: 54yo male here for I&D of wrist. Pharmacy is consulted to dose cipro and vancomycin for septic arthritis. Pt is afebrile, WBC 10.2, sCr 1.3.  Pt received vancomycin 1g at 1300 and levaquin  at 1700. Will give additional 1g of vancomycin for total load of 2g.  Goal of Therapy:  Vancomycin trough level 15-20 mcg/ml  Plan:  Vancomycin 1g IV q12h Cipro  IV q12h  Measure antibiotic drug levels at steady state Follow up culture results, renal function and clinical course  Arlean Hopping. Newman Pies, PharmD Clinical Pharmacist Pager [phone] 07/28/2015,6:05 PM

## 2015-07-28 NOTE — Progress Notes (Signed)
Pt arrived from ER via stretcher with RN. Telephone report given. He was alert and oriented with stable vitals on room air. Mom at bedisde. Will continue to assess

## 2015-07-28 NOTE — Progress Notes (Signed)
S/p R Wrist aspiration: Fluid analysis suggestive of septic wrist. Will take to OR for wrist joint wash out.

## 2015-07-28 NOTE — Progress Notes (Signed)
ANTICOAGULATION CONSULT NOTE - Initial Consult  Pharmacy Consult for Coumadin Indication: Hx of DVT / PE  Allergies  Allergen Reactions  . Shellfish Allergy Shortness Of Breath  . Penicillins Hives  . Latex Rash    Patient Measurements: Height: 6' (182.9 cm) Weight: 235 lb (106.595 kg) IBW/kg (Calculated) : 77.6  Vital Signs: Temp: 97.5 F (36.4 C) (08/04 1349) Temp Source: Oral (08/04 1349) BP: 121/84 mmHg (08/04 1349) Pulse Rate: 86 (08/04 1349)  Labs:  Recent Labs  07/28/15 1050  HGB 14.8  HCT 43.4  PLT 260  LABPROT 47.0*  INR 5.27*  CREATININE 1.30*    Estimated Creatinine Clearance: 83.9 mL/min (by C-G formula based on Cr of 1.3).   Medical History: Past Medical History  Diagnosis Date  . 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 Personal history of venous thrombosis and embolism   . Dysplasia of anus   . Esophageal reflux   . Horseshoe tear of retina without detachment 02-13-12  . 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  . Molluscum contagiosum 09-29-2012  . 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 infarction   . Other convulsions   . Unspecified hereditary and idiopathic peripheral neuropathy   . Secondary iridocyclitis, infectious   . 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 PE (pulmonary embolism) 04/09/2013    Overview:  Jan 2012   . 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 Hernia, inguinal 04/09/2013  . 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 Deep vein thrombosis 05/09/2011    Overview:  Jan 2012   . AIDS 05/24/2015  . Shortness of breath dyspnea   . Septic arthritis of wrist     rt  . HIV (human immunodeficiency virus infection)     Assessment: 54 yo M presents on 8/4 with a possible gout attack. PMH includes gout, hx of DVT /PE, AIDs, CMV, neuropathy, and seizures. Pharmacy consulted to continue coumadin while inpatient. Pt reports going to PCP on 8/3 and lab work showed INR of 7.7. He was to hold his coumadin till 8/6. INR on admit is still  elevated at 5.27. Pt to go to OR tonight for aspiration of R wrist to rule out septic arthritis. CBC stable, no s/s of bleed.  PTA Coumadin  daily exc 7.5mg  on Mon/Thurs.  Goal of Therapy:  INR 2-3 Monitor platelets by anticoagulation protocol: Yes   Plan:  HOLD coumadin tonight Monitor daily INR, CBC, s/s of bleed   Endre Coutts J 07/28/2015,5:01 PM

## 2015-07-28 NOTE — ED Provider Notes (Signed)
CSN: [id number]     Arrival date & time 07/28/15  0835 History  This chart was scribed for non-physician practitioner Dierdre Forth, PA-C, working with Abelino Derrick, MD, by Tanda Rockers, ED Scribe. This patient was seen in room TR07C/TR07C and the patient's care was started at 9:12 AM.  Chief Complaint  Patient presents with  . Gout   The history is provided by the patient and medical records. No language interpreter was used.    HPI Comments: Craig [name] is a 54 y.o. male with hx gout, DVT and PE (chronically anticoagulated on coumadin), AIDS, CMV, peripheral neuropathy, seizures who presents to the Emergency Department complaining of right wrist and forearm swelling that began 2 weeks ago, worsening in last 2 days. Pt notes that he fell down 14 steps approximately 2 weeks ago and landed onto his right forearm, causing minimal pain and swelling. Pt was not evaluated at that time for the fall, nor did he get x rays done. He mentions that in the last 2 days the swelling and pain have increased and localized to more of the wrist. He also mentions a sharp, shooting pain in his right arm 1 day ago. Pt states that this feels different than his usual gout flare ups. He denies fever, chills, nausea, vomiting, numbness, tingling, weakness, penile discharge, testicular pain, or any other associated symptoms. No hx IV drug use or STDs. Pt does mention that he was seen at PCP's office yesterday and had lab work done to check his INR which he states was 7.7.  He is holding his coumadin until Saturday.  Pt is immunosuppressed on Methotrexate but is being treated for his HIV with Tivicay and Truvada.    Pt was recently evaluated for diarrhea and stool culture was positive for crytospordium and lactoferin.  May 2016 CD4 count 550 and Viral load was undetectable at that time.    PCP - [facility] - [place], [place]   Past Medical History  Diagnosis Date  . 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  Personal history of venous thrombosis and embolism   . Dysplasia of anus   . Esophageal reflux   . Horseshoe tear of retina without detachment 02-13-12  . 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  . Molluscum contagiosum 09-29-2012  . 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 infarction   . Other convulsions   . Unspecified hereditary and idiopathic peripheral neuropathy   . Secondary iridocyclitis, infectious   . 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 PE (pulmonary embolism) 04/09/2013    Overview:  Jan 2012   . 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 Hernia, inguinal 04/09/2013  . 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 Deep vein thrombosis 05/09/2011    Overview:  Jan 2012   . AIDS 05/24/2015   History reviewed. No pertinent past surgical history. No family history on file. History  Substance Use Topics  . Smoking status: Never Smoker   . Smokeless tobacco: Never Used  . Alcohol Use: No    Review of Systems  Constitutional: Negative for fever and chills.  Gastrointestinal: Negative for nausea and vomiting.  Genitourinary: Negative for discharge and testicular pain.  Musculoskeletal: Positive for joint swelling and arthralgias.  Skin: Negative for rash and wound.  Allergic/Immunologic: Positive for immunocompromised state.  Neurological: Negative for numbness.  Hematological: Bruises/bleeds easily.   Allergies  Shellfish allergy; Penicillins; and Latex  Home Medications   Prior to Admission medications   Medication Sig Start Date End Date Taking? Authorizing Provider  albuterol (  PROVENTIL HFA;VENTOLIN HFA) 108 (90 BASE) MCG/ACT inhaler Inhale 2 puffs into the lungs every 6 (six) hours as needed for wheezing or shortness of breath.    Historical Provider, MD  beclomethasone (QVAR) 40 MCG/ACT inhaler Inhale 1 puff into the lungs 2 (two) times daily as needed (shortness of breath).     Historical Provider, MD  budesonide (RHINOCORT AQUA) 32 MCG/ACT nasal spray  Place 2 sprays into both nostrils daily. 03/16/15   Charlestine Night, PA-C  citalopram (CELEXA) 40 MG tablet Take 40 mg by mouth daily.    Historical Provider, MD  clobetasol ointment (TEMOVATE) 0.05 % Apply 1 application topically 2 (two) times daily.  10/04/14   Historical Provider, MD  dolutegravir (TIVICAY) 50 MG tablet Take 1 tablet (50 mg total) by mouth daily. 03/03/15   Randall Hiss, MD  emtricitabine-tenofovir (TRUVADA) 200-300 MG per tablet Take 1 tablet by mouth daily. 03/03/15   Randall Hiss, MD  hydrOXYzine (ATARAX/VISTARIL) 25 MG tablet Take 25 mg by mouth every 6 (six) hours as needed for anxiety.     Historical Provider, MD  imiquimod (ALDARA) 5 % cream Apply topically 3 (three) times a week.    Historical Provider, MD  lisinopril (PRINIVIL,ZESTRIL) 20 MG tablet Take 1 tablet (20 mg total) by mouth daily. 03/08/14   Gust Rung, DO  methotrexate (RHEUMATREX) 2.5 MG tablet Take 2.5 mg by mouth once a week. Caution:Chemotherapy. Protect from light. Take 6 tablets ( 15mg ) once a week.    Historical Provider, MD  warfarin (COUMADIN) 5 MG tablet Take 5-7.5 mg by mouth daily. M and Th take 1 1/2 tab (7.5mg ) all others days take 1 tab    Historical Provider, MD   Triage Vitals: BP 154/94 mmHg  Pulse 102  Temp(Src) 98 F (36.7 C) (Oral)  Resp 16  Ht 6' (1.829 m)  Wt 235 lb (106.595 kg)  BMI 31.86 kg/m2  SpO2 98%   Physical Exam  Constitutional: He appears well-developed and well-nourished. No distress.  HENT:  Head: Normocephalic and atraumatic.  Eyes: Conjunctivae are normal.  Neck: Normal range of motion.  Cardiovascular: Normal rate, regular rhythm, normal heart sounds and intact distal pulses.   No murmur heard. Capillary refill < 3 sec  Pulmonary/Chest: Effort normal and breath sounds normal.  Musculoskeletal: He exhibits tenderness. He exhibits no edema.  ROM: FROM of the right elbow, but significantly limited ROM of the right wrist and fingers Wrist is  warm to touch and erythematous, no induration of the tissue  Neurological: He is alert. Coordination normal.  Sensation intact to dull and sharp Strength 4/5 in the right elbow; 0/5 in the right wrist due to pain  Skin: Skin is warm and dry. He is not diaphoretic.  No tenting of the skin  Psychiatric: He has a normal mood and affect.  Nursing note and vitals reviewed.   ED Course  Procedures (including critical care time)  DIAGNOSTIC STUDIES: Oxygen Saturation is 98% on RA, normal by my interpretation.    COORDINATION OF CARE: 9:17 AM-Discussed treatment plan which includes DG R Wrist and Toradol injection with pt at bedside and pt agreed to plan.   Labs Review Labs Reviewed  PROTIME-INR - Abnormal; Notable for the following:    Prothrombin Time 47.0 (*)    INR 5.27 (*)    All other components within normal limits  BASIC METABOLIC PANEL - Abnormal; Notable for the following:    Potassium 2.7 (*)  Creatinine, Ser 1.30 (*)    All other components within normal limits  SEDIMENTATION RATE - Abnormal; Notable for the following:    Sed Rate 21 (*)    All other components within normal limits  SYNOVIAL CELL COUNT + DIFF, W/ CRYSTALS - Abnormal; Notable for the following:    Appearance-Synovial CLOUDY (*)    WBC, Synovial 40981 (*)    Neutrophil, Synovial 94 (*)    Monocyte-Macrophage-Synovial Fluid 6 (*)    All other components within normal limits  BODY FLUID CULTURE  CBC WITH DIFFERENTIAL/PLATELET  I-STAT CG4 LACTIC ACID, ED  I-STAT CG4 LACTIC ACID, ED    Imaging Review Dg Wrist Complete Right  07/28/2015   CLINICAL DATA:  Pain for 1 day.  No recent trauma.  History of gout  EXAM: RIGHT WRIST - COMPLETE 3+ VIEW  COMPARISON:  July 07, 2012  FINDINGS: Frontal, oblique, lateral, and ulnar deviation scaphoid images were obtained. There is no demonstrable fracture or dislocation. Joint spaces appear intact. No erosive change or intra-articular calcification.  IMPRESSION: No  fracture or dislocation.  No appreciable arthropathy.   Electronically Signed   By: Bretta Bang III M.D.   On: 07/28/2015 09:53     EKG Interpretation None        <ECG> ED ECG REPORT   Date: 07/28/2015  Rate: 94  Rhythm: normal sinus rhythm  QRS Axis: left  Intervals: normal  ST/T Wave abnormalities: nonspecific ST/T changes  Conduction Disutrbances:Incomplete RBBB  Narrative Interpretation: Changes noted from previous. No U waves.  Old EKG Reviewed: changes noted  I have personally reviewed the EKG tracing and agree with the computerized printout as noted.   MDM   Final diagnoses:  Septic arthritis of right wrist  Arthralgia of wrist, right  Hypokalemia  HIV disease  AIDS  History of pulmonary embolus (PE)  History of DVT (deep vein thrombosis)  Long term current use of anticoagulant therapy  Essential (primary) hypertension  Elevated INR   [name] presents with right wrist pain. Swelling and erythema concerning for gout versus septic arthritis. Patient is tachycardic however he is afebrile. He reports that his blood pressure is high. Joint is warm with minimal range of motion and significantly tender to palpation. No overlying cellulitis noted.  Patient immunocompromised and supra therapeutic on his anticoagulation.  Patient will need joint aspiration. Labs pending. Will consult with orthopedics/hand surgery.  Pt may need admission for septic arthritis r/o.    The patient was discussed with and seen by Dr. Corlis Leak who agrees with the treatment plan.  10:50 AM Discussed with Dr. Izora Ribas who will evaluate for potential joint aspiration.  Labs pending.  11:52 AM Tap of right wrist joint by Dr. Izora Ribas.  Pt to be admitted for observation and potential wash out by Dr. Izora Ribas.  Will begin Vanc and Levaquin per pharmacy consult.    BP 121/84 mmHg  Pulse 86  Temp(Src) 97.5 F (36.4 C) (Oral)  Resp 18  Ht 6' (1.829 m)  Wt 235 lb (106.595 kg)  BMI 31.86 kg/m2   SpO2 98%  Patient discussed with internal medicine teaching service who will admit to MedSurg bed. Patient is receiving repletion for his potassium. EKG without U waves.    Dahlia Client Sayer Masini, PA-C 07/28/15 1503  Courteney Randall An, MD 08/01/15 [phone]

## 2015-07-29 ENCOUNTER — Encounter (HOSPITAL_COMMUNITY): Payer: Self-pay | Admitting: General Surgery

## 2015-07-29 DIAGNOSIS — I252 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 infarction: Secondary | ICD-10-CM | POA: Diagnosis not present

## 2015-07-29 DIAGNOSIS — Z7901 Long term (current) use of anticoagulants: Secondary | ICD-10-CM | POA: Diagnosis not present

## 2015-07-29 DIAGNOSIS — M19031 Primary osteoarthritis, right wrist: Secondary | ICD-10-CM | POA: Diagnosis present

## 2015-07-29 DIAGNOSIS — J45909 Unspecified asthma, uncomplicated: Secondary | ICD-10-CM | POA: Diagnosis present

## 2015-07-29 DIAGNOSIS — Z86711 Personal history of pulmonary embolism: Secondary | ICD-10-CM | POA: Diagnosis not present

## 2015-07-29 DIAGNOSIS — B20 Human immunodeficiency virus [HIV] disease: Secondary | ICD-10-CM | POA: Diagnosis present

## 2015-07-29 DIAGNOSIS — M25431 Effusion, right wrist: Secondary | ICD-10-CM | POA: Diagnosis not present

## 2015-07-29 DIAGNOSIS — Z86718 Personal history of other venous thrombosis and embolism: Secondary | ICD-10-CM | POA: Diagnosis not present

## 2015-07-29 DIAGNOSIS — I10 Essential (primary) hypertension: Secondary | ICD-10-CM | POA: Diagnosis present

## 2015-07-29 DIAGNOSIS — E876 Hypokalemia: Secondary | ICD-10-CM | POA: Diagnosis present

## 2015-07-29 DIAGNOSIS — Z91040 Latex allergy status: Secondary | ICD-10-CM | POA: Diagnosis not present

## 2015-07-29 DIAGNOSIS — Z91013 Allergy to seafood: Secondary | ICD-10-CM | POA: Diagnosis not present

## 2015-07-29 DIAGNOSIS — Z8739 Personal history of other diseases of the musculoskeletal system and connective tissue: Secondary | ICD-10-CM | POA: Diagnosis not present

## 2015-07-29 DIAGNOSIS — Z9889 Other specified postprocedural states: Secondary | ICD-10-CM | POA: Diagnosis not present

## 2015-07-29 DIAGNOSIS — I82509 Chronic embolism and thrombosis of unspecified deep veins of unspecified lower extremity: Secondary | ICD-10-CM

## 2015-07-29 DIAGNOSIS — Z880 Allergy status to penicillin: Secondary | ICD-10-CM | POA: Diagnosis not present

## 2015-07-29 DIAGNOSIS — R791 Abnormal coagulation profile: Secondary | ICD-10-CM | POA: Insufficient documentation

## 2015-07-29 DIAGNOSIS — F329 Major depressive disorder, single episode, unspecified: Secondary | ICD-10-CM | POA: Diagnosis present

## 2015-07-29 DIAGNOSIS — M109 Gout, unspecified: Secondary | ICD-10-CM | POA: Diagnosis present

## 2015-07-29 LAB — PROTIME-INR
INR: 4.57 — ABNORMAL HIGH (ref 0.00–1.49)
PROTHROMBIN TIME: 42 s — AB (ref 11.6–15.2)

## 2015-07-29 LAB — BASIC METABOLIC PANEL
Anion gap: 5 (ref 5–15)
BUN: 11 mg/dL (ref 6–20)
CALCIUM: 8.3 mg/dL — AB (ref 8.9–10.3)
CO2: 24 mmol/L (ref 22–32)
Chloride: 109 mmol/L (ref 101–111)
Creatinine, Ser: 1.42 mg/dL — ABNORMAL HIGH (ref 0.61–1.24)
GFR calc non Af Amer: 55 mL/min — ABNORMAL LOW (ref >60)
Glucose, Bld: 98 mg/dL (ref 65–99)
POTASSIUM: 3.4 mmol/L — AB (ref 3.5–5.1)
Sodium: 138 mmol/L (ref 135–145)

## 2015-07-29 LAB — URIC ACID: URIC ACID, SERUM: 9.9 mg/dL — AB (ref 4.4–7.6)

## 2015-07-29 MED ORDER — DOXYCYCLINE HYCLATE 50 MG PO CAPS: 100.0000 mg | ORAL_CAPSULE | Freq: Two times a day (BID) | ORAL | Status: DC

## 2015-07-29 NOTE — Progress Notes (Signed)
Patient swabbed with qtip to the mouth, penis and anal area to test for chlamydia/gonorrhea. Swabs sent to lab. RN received call from lab that the tubes used were incorrect. Correct tubes obtained. Patient is refusing being swabbed again. RN paged MD, awaiting a return call.

## 2015-07-29 NOTE — Progress Notes (Signed)
S: still some wrist pain but overall better  O:Blood pressure 93/51, pulse 87, temperature 98.1 F (36.7 C), temperature source Oral, resp. rate 17, height 6' (1.829 m), weight 106.595 kg (235 lb), SpO2 98 %.  Wrist with less swelling, less warmth G-stain/Cx pending - on Vanc Cipro  A:s/p I&D R wrist joint    P: transition to oral abx, would like to see g stain?, may wash hand in shower, rec ace wrap around wrist with limited weight until wrist discomfort improves.  F/u in office in 1-2 wks, prn if wrist no better/worse.

## 2015-07-29 NOTE — Discharge Summary (Signed)
Name: Craig [name] MRN: [id number] DOB: Dec 29, 1960 54 y.o. PCP: Dwana Melena, PA  Date of Admission: 07/28/2015  8:41 AM Date of Discharge: 07/30/2015 Attending Physician: Dr Cyndie Chime Discharge Diagnosis:  Principal Problem:  Possible Septic arthritis  Active Problems:   Long term current use of anticoagulant therapy   Asthma, chronic   HIV infection   Essential (primary) hypertension   History of pulmonary embolus (PE)   Arthritis   Elevated INR History of Gout  Discharge Medications:   Medication List    TAKE these medications        albuterol 108 (90 BASE) MCG/ACT inhaler  Commonly known as:  PROVENTIL HFA;VENTOLIN HFA  Inhale 2 puffs into the lungs every 6 (six) hours as needed for wheezing or shortness of breath.     beclomethasone 40 MCG/ACT inhaler  Commonly known as:  QVAR  Inhale 1 puff into the lungs 2 (two) times daily as needed (shortness of breath).     budesonide 32 MCG/ACT nasal spray  Commonly known as:  RHINOCORT AQUA  Place 2 sprays into both nostrils daily.     citalopram 40 MG tablet  Commonly known as:  CELEXA  Take 40 mg by mouth daily.     clobetasol ointment 0.05 %  Commonly known as:  TEMOVATE  Apply 1 application topically 2 (two) times daily.     colchicine 0.6 MG tablet  Take 0.6 mg by mouth 2 (two) times daily.     dolutegravir 50 MG tablet  Commonly known as:  TIVICAY  Take 1 tablet (50 mg total) by mouth daily.     doxycycline 100 MG tablet  Commonly known as:  VIBRA-TABS  Take 1 tablet (100 mg total) by mouth every 12 (twelve) hours.     emtricitabine-tenofovir 200-300 MG per tablet  Commonly known as:  TRUVADA  Take 1 tablet by mouth daily.     hydrOXYzine 25 MG tablet  Commonly known as:  ATARAX/VISTARIL  Take 25 mg by mouth every 6 (six) hours as needed for anxiety.     imiquimod 5 % cream  Commonly known as:  ALDARA  Apply topically 3 (three) times a week.     lisinopril 20 MG tablet  Commonly known as:   PRINIVIL,ZESTRIL  Take 1 tablet (20 mg total) by mouth daily.     triamcinolone ointment 0.1 %  Commonly known as:  KENALOG  Apply 1 application topically 2 (two) times daily as needed (for itching). On Face     warfarin 5 MG tablet  Commonly known as:  COUMADIN  Take 5-7.5 mg by mouth daily. M and Th take 1 1/2 tab (7.5mg ) all others days take 1 tab        Disposition and follow-up:   CraigYaviel Isip was discharged from [facility] in Stable condition.  At the hospital follow up visit please address:  1.  Resolution of right wrist pain, please review [facility] records to see if he has ever had Crystal confirmed gout, if so consider starting Urate lowering therapy.  2.  Warfarin dosing, INR back to 2.6 on discharge, has follow-up on Monday 8/8. Was resumed on home dose.  2.  Labs / imaging needed at time of follow-up: none  3.  Pending labs/ test needing follow-up: GC/C swabs (oral and rectal) and urine GC/C.  Synovial fluid culture  Follow-up Appointments: Follow-up Information    Follow up with Molinda Bailiff, MD. Schedule an appointment as soon as possible for  a visit in 1 week.   Specialty:  General Surgery   Why:  for to reevaluate your wrist   Contact information:   [address] [place] [address] [phone]       Follow up with SCOTT, RUSSELL B, PA. Go on 08/01/2015.   Specialty:  Internal Medicine   Why:  for INR check and follow up   Contact information:   [id number] [facility] [place] [address] [phone]       Discharge Instructions: Discharge Instructions    Call MD for:  redness, tenderness, or signs of infection (pain, swelling, redness, odor or green/yellow discharge around incision site)    Complete by:  As directed      Call MD for:  severe uncontrolled pain    Complete by:  As directed      Call MD for:  temperature >100.4    Complete by:  As directed      Diet - low sodium  heart healthy    Complete by:  As directed      Increase activity slowly    Complete by:  As directed            Consultations:    Procedures Performed:  Dg Wrist Complete Right  07/28/2015   CLINICAL DATA:  Pain for 1 day.  No recent trauma.  History of gout  EXAM: RIGHT WRIST - COMPLETE 3+ VIEW  COMPARISON:  July 07, 2012  FINDINGS: Frontal, oblique, lateral, and ulnar deviation scaphoid images were obtained. There is no demonstrable fracture or dislocation. Joint spaces appear intact. No erosive change or intra-articular calcification.  IMPRESSION: No fracture or dislocation.  No appreciable arthropathy.   Electronically Signed   By: Bretta Bang III M.D.   On: 07/28/2015 09:53    Admission HPI: Craig [name] is a 54 yo man with PMH of gout, HIV on HAART, chronic eczema, HTN, and history of DVT, and arthritis who presents with pain and swelling of his right wrist for about 2 days. The pain when he initially came in was about 10/10 and throbbing pain, but upon bedside exam, it was 8/10 after the ankle joint was aspirated by ortho. It became progressively large and hot and tender.   He was also complaining of diarrhea for about 2 weeks and attributes it to a viral bug- diarrhea 10 times a day last week, but since this week about 1-2 times a day. He says it is improving  He denies fevers, chills, CP, SOB, change in appetite, weight change, nausea, or vomiting, blood in stool, dizziness,   He does have a history of gout- episodes about twice a year and happens mainly in the knees. 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 He does not follow up regularly but last week he was told to hold it for few days.   His mother was present in the room when examined and patient was comfortable with that. He does not smoke, drink, or do illegal drugs. Last sexual encounter was 6 months ago.   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   Hospital Course by problem list:  Possible Septic arthritis versus Gout - Patient presented with right wrist pain and evidence of synovitis, the joint was aspirated in the ED and testing revealed a WBC count of 15k without crystals.  Dr Izora Ribas (Hand surgery) was consulted and felt with the absence of confirmed crystals that this could represent a septic joint.  He irrigated the joint with Normal saline and patient was started on IV Vancomycin and IV Cipro (due to History of PCN allergy).  The gram stain did show WBC but was without bacteria.  A culture was sent of the synovial fluid.  Interestingly review [facility] shows received arthrocentesis of his right ankle in November, and we were able to see the results of a culture of that was negative. He does have a hisory of gout on his chart however we were unable to find in [facility] records that he has crystal confirmed gout. His synovial fluid analysis here is probably more consistent with gout except that crystals are negative leading [place] to the possibility of a septic joint. After discussion with Dr Izora Ribas we decided on a cautious approach of a 14 day course of doxycyline. However this may be discontinued earlier if his cultures return negative.  Given two different joint synovitis in the last few months we decided to get sample for Gonococcal and Chlamydia testings of Anal, Oral, and Urine specimens to rule out GC cause.    Long term current use of anticoagulant therapy due to History of pulmonary embolus (PE) - His INR was supratheraputic on admission, he had already had this addressed by his PCP (felt to be due to diarrheal illness).  His coumadin was held. On day of discharge his INR was 2.6 and we  instructed him to resume his coumadin.  He has follow up with PCP for INR check on 8/8.    HIV infection -His HIV is well controlled and he was continued on his home ARV regimen    Essential (primary) hypertension - Continued on home antihypertensives  Discharge Vitals:   BP 114/69 mmHg  Pulse 90  Temp(Src) 98 F (36.7 C) (Oral)  Resp 17  Ht 6' (1.829 m)  Wt 106.595 kg (235 lb)  BMI 31.86 kg/m2  SpO2 96%  Discharge Labs:  No results found for this or any previous visit (from the past 24 hour(s)).  Signed: Gust Rung, DO 07/31/2015, 5:17 PM    Services Ordered on Discharge: None Equipment Ordered on Discharge: None

## 2015-07-29 NOTE — Op Note (Signed)
NAMEMarland Kitchen  Craig [name], Craig [name] NO.:  [id number]  MEDICAL RECORD NO.:  [id number]  LOCATION:  6N13C                        FACILITY:  [facility]  PHYSICIAN:  Johnette Abraham, MD    DATE OF BIRTH:  1961-04-22  DATE OF PROCEDURE:  07/28/2015 DATE OF DISCHARGE:                              OPERATIVE REPORT   PREOPERATIVE DIAGNOSIS:  Septic wrist joint on the right.  POSTOPERATIVE DIAGNOSIS:  Septic wrist joint on the right.  PROCEDURE:  Irrigation of right wrist joint.  ANESTHESIA:  General.  INDICATIONS:  [name] is a 54 year old gentleman who presented to the ER with several days of a swollen painful right wrist.  He is HIV positive.  He is immunosuppressed.  On evaluation, he had very tender dorsal wrist with pain with any wrist motion suggestive of septic wrist. This was aspirated in the emergency department.  Fluid analysis came back very suggestive of septic wrist, and therefore the patient was counseled on operative irrigation.  Risks, benefits, and alternatives of surgery were discussed with him, he agreed with this, and consent was obtained.  DESCRIPTION OF PROCEDURE:  The patient was taken to the operating room, placed supine on the operating room table.  General anesthesia was administered without difficulty.  A time-out was performed identifying the correct extremity, the right extremity.  Right upper extremity was prepped and draped in normal sterile fashion.  The dorsal wrist joint was accessed with a 16-gauge angiocatheter.  Drainage placement was confirmed by some turbid fluid.  An additional portal was placed just ulnar to this.  Irrigation of the wrist joint was then performed through both of these portals until clear fluid was obtained.  Total about 250 mL of normal saline solution was used to irrigate the wrist joint. Afterwards sterile dressing was applied as well as a mid bulky dressing and Ace bandage.  The patient tolerated procedure well,  awakened from anesthesia in stable condition.     Johnette Abraham, MD     HCC/MEDQ  D:  07/28/2015  T:  07/29/2015  Job:  [id number]

## 2015-07-29 NOTE — Discharge Instructions (Addendum)
Please take doxycycline 2 pills twice a day for 14 days Please follow up with PCP, for INR check on Monday, you may resume taking  of warfarin daily until then. Please follow up with Dr. Izora Ribas in 1 week or so- please call to make an appointment   Information on my medicine - Coumadin   (Warfarin)  This medication education was reviewed with me or my healthcare representative as part of my discharge preparation.  The pharmacist that spoke with me during my hospital stay was:  Pasty Spillers, [facility]  Why was Coumadin prescribed for you? Coumadin was prescribed for you because you have a blood clot or a medical condition that can cause an increased risk of forming blood clots. Blood clots can cause serious health problems by blocking the flow of blood to the heart, lung, or brain. Coumadin can prevent harmful blood clots from forming. As a reminder your indication for Coumadin is:   -  What test will check on my response to Coumadin? While on Coumadin (warfarin) you will need to have an INR test regularly to ensure that your dose is keeping you in the desired range. The INR (international normalized ratio) number is calculated from the result of the laboratory test called prothrombin time (PT).  If an INR APPOINTMENT HAS NOT ALREADY BEEN MADE FOR YOU please schedule an appointment to have this lab work done by your health care provider within 7 days. Your INR goal is usually a number between:  2 to 3 or your provider may give you a more narrow range like 2-2.5.  Ask your health care provider during an office visit what your goal INR is.  What  do you need to  know  About  COUMADIN? Take Coumadin (warfarin) exactly as prescribed by your healthcare provider about the same time each day.  DO NOT stop taking without talking to the doctor who prescribed the medication.  Stopping without other blood clot prevention medication to take the place of Coumadin may increase your risk of  developing a new clot or stroke.  Get refills before you run out.  What do you do if you miss a dose? If you miss a dose, take it as soon as you remember on the same day then continue your regularly scheduled regimen the next day.  Do not take two doses of Coumadin at the same time.  Important Safety Information A possible side effect of Coumadin (Warfarin) is an increased risk of bleeding. You should call your healthcare provider right away if you experience any of the following: ? Bleeding from an injury or your nose that does not stop. ? Unusual colored urine (red or dark brown) or unusual colored stools (red or black). ? Unusual bruising for unknown reasons. ? A serious fall or if you hit your head (even if there is no bleeding).  Some foods or medicines interact with Coumadin (warfarin) and might alter your response to warfarin. To help avoid this: ? Eat a balanced diet, maintaining a consistent amount of Vitamin K. ? Notify your provider about major diet changes you plan to make. ? Avoid alcohol or limit your intake to 1 drink for women and 2 drinks for men per day. (1 drink is 5 oz. wine, 12 oz. beer, or 1.5 oz. liquor.)  Make sure that ANY health care provider who prescribes medication for you knows that you are taking Coumadin (warfarin).  Also make sure the healthcare provider who is monitoring your Coumadin knows  when you have started a new medication including herbals and non-prescription products.  Coumadin (Warfarin)  Major Drug Interactions  Increased Warfarin Effect Decreased Warfarin Effect  Alcohol (large quantities) Antibiotics (esp. Septra/Bactrim, Flagyl, Cipro) Amiodarone (Cordarone) Aspirin (ASA) Cimetidine (Tagamet) Megestrol (Megace) NSAIDs (ibuprofen, naproxen, etc.) Piroxicam (Feldene) Propafenone (Rythmol SR) Propranolol (Inderal) Isoniazid (INH) Posaconazole (Noxafil) Barbiturates (Phenobarbital) Carbamazepine (Tegretol) Chlordiazepoxide  (Librium) Cholestyramine (Questran) Griseofulvin Oral Contraceptives Rifampin Sucralfate (Carafate) Vitamin K   Coumadin (Warfarin) Major Herbal Interactions  Increased Warfarin Effect Decreased Warfarin Effect  Garlic Ginseng Ginkgo biloba Coenzyme Q10 Green tea St. Johns wort    Coumadin (Warfarin) FOOD Interactions  Eat a consistent number of servings per week of foods HIGH in Vitamin K (1 serving =  cup)  Collards (cooked, or boiled & drained) Kale (cooked, or boiled & drained) Mustard greens (cooked, or boiled & drained) Parsley *serving size only =  cup Spinach (cooked, or boiled & drained) Swiss chard (cooked, or boiled & drained) Turnip greens (cooked, or boiled & drained)  Eat a consistent number of servings per week of foods MEDIUM-HIGH in Vitamin K (1 serving = 1 cup)  Asparagus (cooked, or boiled & drained) Broccoli (cooked, boiled & drained, or raw & chopped) Brussel sprouts (cooked, or boiled & drained) *serving size only =  cup Lettuce, raw (green leaf, endive, romaine) Spinach, raw Turnip greens, raw & chopped   These websites have more information on Coumadin (warfarin):  FailFactory.se; VeganReport.com.au;

## 2015-07-29 NOTE — Progress Notes (Addendum)
ANTICOAGULATION CONSULT NOTE - Follow Up Consult  Pharmacy Consult for Coumadin Indication: h/o blood clot  Allergies  Allergen Reactions  . Shellfish Allergy Shortness Of Breath  . Penicillins Hives  . Latex Rash    Patient Measurements: Height: 6' (182.9 cm) Weight: 235 lb (106.595 kg) IBW/kg (Calculated) : 77.6 Heparin Dosing Weight:    Vital Signs: Temp: 98.3 F (36.8 C) (08/05 1315) Temp Source: Oral (08/05 1315) BP: 98/52 mmHg (08/05 1315) Pulse Rate: 95 (08/05 1315)  Labs:  Recent Labs  07/28/15 1050 07/28/15 1838 07/29/15 0839 07/29/15 1400  HGB 14.8 12.9*  --   --   HCT 43.4 38.0*  --   --   PLT 260  --   --   --   LABPROT 47.0*  --   --  42.0*  INR 5.27*  --   --  4.57*  CREATININE 1.30*  --  1.42*  --     Estimated Creatinine Clearance: 76.8 mL/min (by C-G formula based on Cr of 1.42).   Assessment:  55 yo M presents on 8/4 with a possible gout attack.  - PMH includes gout, hx of DVT /PE, AIDS, CMV, neuropathy, and seizures.  - Pharmacy consulted to continue coumadin while inpatient. Pt reports going to PCP on 8/3 and lab work showed INR of 7.7. He was to hold his coumadin till 8/6. INR on admit is still elevated at 5.27.   AC: Admit INR 5.27. INR down to 4.5 today. Patient says he takes Coumadin due to a blood clot in 2012. - PTA Coumadin  daily exc 7.5mg  on Mon/Thurs.   Goal of Therapy:  INR 2-3 Monitor platelets by anticoagulation protocol: Yes   Plan:  Continue to hold Coumadin tonight. Daily INR   Analeise Mccleery S. Merilynn Finland, PharmD, BCPS Clinical Staff Pharmacist Pager [phone]  Misty Stanley Stillinger 07/29/2015,2:30 PM

## 2015-07-29 NOTE — Progress Notes (Signed)
Subjective: No acute events overnight. Pt had washout of the joint yesterday evening and tolerated the procedure well. Complains of less pain and swelling- on vanc and cipro  Objective: Vital signs in last 24 hours: Filed Vitals:   07/28/15 2013 07/29/15 0139 07/29/15 0636 07/29/15 1030  BP: 107/61 1$RemoveBef'10/60 93/51 96/50 'OrAHJcdERg$  Pulse: 79 82 87 88  Temp: 97.4 F (36.3 C) 97.3 F (36.3 C) 98.1 F (36.7 C) 98.3 F (36.8 C)  TempSrc: Axillary Oral Oral Oral  Resp: $Remo'17 18 17 19  'sRdSa$ Height:      Weight:      SpO2: 98% 99% 98% 99%   Weight change:   Intake/Output Summary (Last 24 hours) at 07/29/15 1243 Last data filed at 07/29/15 0636  Gross per 24 hour  Intake   2560 ml  Output      0 ml  Net   2560 ml   General: A&O, in NAD HEENT: EOMI,  Neck: supple, midline trachea, CV: RRR, normal s1, s2, no m/r/g, no carotid bruits appreciated, no JVD appreciated Resp: equal and symmetric breath sounds, no wheezing heard Abdomen: soft, nontender, nondistended, +BS in all 4 quadrants, had ventral hernia Skin: warm, dry, intact, no open lesions or rashes noted Extremities: pulses intact b/l, no edema, clubbing or cyanosis, no calf tenderness Right wrist- s/p wound irrigation POD1, right arm bandaged. Able to move fingers well. Ortho following He had some bruising probably due to coumadin   Lab Results: $RemoveBefo'@LABTEST2'LrVYhOqzFee$ @ Reviewed Micro Results:  Recent Results (from the past 240 hour(s))  Body fluid culture     Status: None (Preliminary result)   Collection Time: 07/28/15 11:52 AM  Result Value Ref Range Status   Specimen Description FLUID WRIST RIGHT  Final   Special Requests SP W/LEANNE RN/ED SAID DO SNVF 1ST THEN FLDC  Final   Gram Stain   Final    ABUNDANT WBC PRESENT, PREDOMINANTLY PMN NO ORGANISMS SEEN    Culture NO GROWTH < 24 HOURS  Final   Report Status PENDING  Incomplete  Surgical pcr screen     Status: Abnormal   Collection Time: 07/28/15  5:34 PM  Result Value Ref Range Status     MRSA, PCR POSITIVE (A) NEGATIVE Final    Comment: RESULT CALLED TO, READ BACK BY AND VERIFIED WITH: R ROBERTS RN 1932 07/28/15 A BROWNING    Staphylococcus aureus POSITIVE (A) NEGATIVE Final    Comment:        The Xpert SA Assay (FDA approved for NASAL specimens in patients over 53 years of age), is one component of a comprehensive surveillance program.  Test performance has been validated by [facility] for patients greater than or equal to 43 year old. It is not intended to diagnose infection nor to guide or monitor treatment.    Studies/Results: Dg Wrist Complete Right  07/28/2015   CLINICAL DATA:  Pain for 1 day.  No recent trauma.  History of gout  EXAM: RIGHT WRIST - COMPLETE 3+ VIEW  COMPARISON:  July 07, 2012  FINDINGS: Frontal, oblique, lateral, and ulnar deviation scaphoid images were obtained. There is no demonstrable fracture or dislocation. Joint spaces appear intact. No erosive change or intra-articular calcification.  IMPRESSION: No fracture or dislocation.  No appreciable arthropathy.   Electronically Signed   By: Lowella Grip III M.D.   On: 07/28/2015 09:53   Medications: I have reviewed the patient's current medications. Scheduled Meds: . budesonide (PULMICORT) nebulizer solution  0.25 mg Nebulization BID  .  ciprofloxacin  400 mg Intravenous Q12H  . citalopram  40 mg Oral Daily  . dolutegravir  50 mg Oral Daily  . emtricitabine-tenofovir  1 tablet Oral Daily  . fluticasone  2 spray Each Nare Daily  . lisinopril  20 mg Oral Daily  . vancomycin  1,000 mg Intravenous Q12H  . Warfarin - Pharmacist Dosing Inpatient   Does not apply q1800   Continuous Infusions: . lactated ringers     PRN Meds:.albuterol, HYDROcodone-acetaminophen Assessment/Plan: Principal Problem:   Septic arthritis Active Problems:   Long term current use of anticoagulant therapy   Asthma, chronic   HIV infection   Essential (primary) hypertension   History of pulmonary embolus  (PE)   Arthritis   Right wrist joint infection 54 yo man with PMH of HIV, gout, HTN, and DVT presents with 2 day history of swelling of right ankle   Wrist swelling: Differentials include Gout vs. Septic arthritis.  Gout more likely as he has a history of gout and afebrile, and sudden onset. Synovial cell count showed 15720 with high neutrophils of 94%, There were no crystals seen , prelim culture shows no growth ESR was 21 . Xray showed no fracture or arthropathy. Possible Septic arthritis: white count 10.1 but his baseline WC is 3-4- but the neutrophil % was 94%  -admit to med/surg -vanc and cipro 400 mg q12H to cover for septic arthritis, pt allergic to penicillin so cipro is an alternative  - Cultures showed no growth in 1 day but many WBC PMN present.  -Ordered uric acid- it was 9.9 (high) -had I&D yesterday - Will transition the pt to PO antibiotics  Hypokalemia:Likely due to a week history of diarrhea and GI loss, likely appears to be resolving  -On NS 125 cc/hr  -Received 120 meQ K  K 3.4 today  Diarrhea:Per pt, seems to be resolving, 2 day history of loose watery stools, with HIV history and cd4 550, concern for infections He was seen on 7/27 at [facility]- checked c diff, gi pathogen panel. Given imodium and asked to have diet as tolerated Had cryptosporidium and lactoferrin on the stool culture on 7/27  HTN: stable on lisinopril 20 mg   HIV: CD4 550, VL 20 in May 2016 On Truvada  DVT: on coumadin per pharmacy Holding coumadin as INR 5.2   Asthma- On proventil and pulmicort   Depression- on celexa  #FEN:  -Diet: carb modified  #DVT prophylaxis: holding due to INR 5.2  #CODE STATUS: full   Dispo: Disposition is deferred at this time, awaiting improvement of current medical problems.  Anticipated discharge in approximately 2 day(s).   The patient does have a current PCP Toni Arthurs, [place]) and does need an [facility] hospital follow-up appointment after  discharge.  The patient does not have transportation limitations that hinder transportation to clinic appointments.  .Services Needed at time of discharge: Y = Yes, Blank = No PT:   OT:   RN:   Equipment:   Other:     LOS: 1 day   Burgess Estelle, MD 07/29/2015, 12:43 PM

## 2015-07-29 NOTE — Progress Notes (Signed)
Per patient, he is not comfortable going home. MD on call paged. Per resident, Dr. Andrey Campanile will be up to talk to the patient.

## 2015-07-30 LAB — PROTIME-INR
INR: 2.6 — ABNORMAL HIGH (ref 0.00–1.49)
Prothrombin Time: 27.5 seconds — ABNORMAL HIGH (ref 11.6–15.2)

## 2015-07-30 MED ORDER — DOXYCYCLINE HYCLATE 100 MG PO TABS
100.0000 mg | ORAL_TABLET | Freq: Two times a day (BID) | ORAL | Status: DC
  Administered 2015-07-30: 100 mg via ORAL
  Filled 2015-07-30: qty 1

## 2015-07-30 MED ORDER — WARFARIN SODIUM 5 MG PO TABS: 5.0000 mg | ORAL_TABLET | Freq: Every day | ORAL | Status: DC

## 2015-07-30 MED ORDER — DOXYCYCLINE HYCLATE 100 MG PO TABS: 100.0000 mg | ORAL_TABLET | Freq: Two times a day (BID) | ORAL | Status: DC

## 2015-07-30 NOTE — Progress Notes (Signed)
Discharge instructions gone over with patient. Home medications gone over. Follow up appointments are made. Coumadin instructions gone over. Diet, activity, and dressing instructions gone over. Reasons to call the doctor gone over. Patient verbalized understanding of discharge instructions.

## 2015-07-30 NOTE — Progress Notes (Signed)
Subjective: Patient was not comfortable leaving last night.  He does report that his wrist pain continues to improve and that he has increased movement.  He denies any diarrhea. Objective: Vital signs in last 24 hours: Filed Vitals:   07/29/15 1315 07/29/15 2000 07/29/15 2146 07/30/15 0543  BP: 98/52  106/56 114/69  Pulse: 95 78 97 90  Temp: 98.3 F (36.8 C)  98.7 F (37.1 C) 98 F (36.7 C)  TempSrc: Oral  Oral Oral  Resp: Height:      Weight:      SpO2: 97% 98% 97% 96%   Weight change:   Intake/Output Summary (Last 24 hours) at 07/30/15 0826 Last data filed at 07/30/15 0544  Gross per 24 hour  Intake    680 ml  Output    300 ml  Net    380 ml   General: resting in bed Cardiac: RRR Pulm: CTAB Abd: soft, nontender, nondistended, BS present Ext: right hand in ACE bandage, able to move all fingers. Neuro: alert and oriented X3, cranial nerves II-XII grossly intact  Lab Results: Basic Metabolic Panel:  Recent Labs Lab 07/28/15 1050 07/28/15 1700 07/28/15 1838 07/29/15 0839  NA 135  --  138 138  K 2.7*  --  3.1* 3.4*  CL 104  --   --  109  CO2 23  --   --  24  GLUCOSE 96  --  91 98  BUN 10  --   --  11  CREATININE 1.30*  --   --  1.42*  CALCIUM 8.9  --   --  8.3*  MG  --  1.7  --   --    CBC:  Recent Labs Lab 07/28/15 1050 07/28/15 1838  WBC 10.2  --   NEUTROABS 7.6  --   HGB 14.8 12.9*  HCT 43.4 38.0*  MCV 84.8  --   PLT 260  --    Coagulation:  Recent Labs Lab 07/28/15 1050 07/29/15 1400 07/30/15 0332  LABPROT 47.0* 42.0* 27.5*  INR 5.27* 4.57* 2.60*    Micro Results: Recent Results (from the past 240 hour(s))  Body fluid culture     Status: None (Preliminary result)   Collection Time: 07/28/15 11:52 AM  Result Value Ref Range Status   Specimen Description FLUID WRIST RIGHT  Final   Special Requests SP W/LEANNE RN/ED SAID DO SNVF 1ST THEN FLDC  Final   Gram Stain   Final    ABUNDANT WBC PRESENT, PREDOMINANTLY PMN NO  ORGANISMS SEEN    Culture NO GROWTH < 24 HOURS  Final   Report Status PENDING  Incomplete  Surgical pcr screen     Status: Abnormal   Collection Time: 07/28/15  5:34 PM  Result Value Ref Range Status   MRSA, PCR POSITIVE (A) NEGATIVE Final    Comment: RESULT CALLED TO, READ BACK BY AND VERIFIED WITH: R ROBERTS RN 1932 07/28/15 A BROWNING    Staphylococcus aureus POSITIVE (A) NEGATIVE Final    Comment:        The Xpert SA Assay (FDA approved for NASAL specimens in patients over 51 years of age), is one component of a comprehensive surveillance program.  Test performance has been validated by [facility] for patients greater than or equal to 53 year old. It is not intended to diagnose infection nor to guide or monitor treatment.    Studies/Results: Dg Wrist Complete Right  07/28/2015   CLINICAL DATA:  Pain for 1 day.  No recent trauma.  History of gout  EXAM: RIGHT WRIST - COMPLETE 3+ VIEW  COMPARISON:  July 07, 2012  FINDINGS: Frontal, oblique, lateral, and ulnar deviation scaphoid images were obtained. There is no demonstrable fracture or dislocation. Joint spaces appear intact. No erosive change or intra-articular calcification.  IMPRESSION: No fracture or dislocation.  No appreciable arthropathy.   Electronically Signed   By: Bretta Bang III M.D.   On: 07/28/2015 09:53   Medications: I have reviewed the patient's current medications. Scheduled Meds: . budesonide (PULMICORT) nebulizer solution  0.25 mg Nebulization BID  . citalopram  40 mg Oral Daily  . dolutegravir  50 mg Oral Daily  . doxycycline  100 mg Oral Q12H  . emtricitabine-tenofovir  1 tablet Oral Daily  . fluticasone  2 spray Each Nare Daily  . lisinopril  20 mg Oral Daily  . Warfarin - Pharmacist Dosing Inpatient   Does not apply q1800   Continuous Infusions: . lactated ringers     PRN Meds:.albuterol, HYDROcodone-acetaminophen Assessment/Plan:   Possible Septic arthritis versus Gout - Review of records  from [facility] shows received arthrocentesis of his right ankle in November, I can see the results of a culture of that was negative.  I cannot find in [facility] records that he has crystal confirmed gout.  His synovial fluid analysis here is probably more consistent with gout except that crystals are negative leading [place] to the possibility of a septic joint.  Gram stain was negative and culture is no growth to date.  I spoke with Dr. Izora Ribas and he recommended cautious approach with empiric doxycycline or bactrim.  Given that he is on coumadin and has some renal insufficiency I am giving him a 14 day course of doxycyline. However we will try to follow up culture results and if negative he can be called and discontinued. - Will re-ttempt to get GC/C swabs of Anal, Oral, and test urine sample.     HIV infection - Well controlled, continue home ART regimen    Essential (primary) hypertension Bp stable continue home medications    Elevated INR with Long term current use of anticoagulant therapy due to History of pulmonary embolus (PE)  - INR now at target. Doubt Doxycycline will effect INR much, will have patient resume 5mg  of coumadin daily and he has follow up with his PCP on Monday.   Dispo: Discharge home today after obtaining GC/C swabs  The patient does have a current PCP Dwana Melena, [place]) and does not need an [facility] hospital follow-up appointment after discharge.  The patient does not have transportation limitations that hinder transportation to clinic appointments.  .Services Needed at time of discharge: Y = Yes, Blank = No PT:   OT:   RN:   Equipment:   Other:     LOS: 2 days   Gust Rung, DO 07/30/2015, 8:26 AM

## 2015-07-30 NOTE — Progress Notes (Addendum)
ANTICOAGULATION CONSULT NOTE - Follow Up Consult  Pharmacy Consult for Coumadin Indication: h/o blood clot  Allergies  Allergen Reactions  . Shellfish Allergy Shortness Of Breath  . Penicillins Hives  . Latex Rash    Patient Measurements: Height: 6' (182.9 cm) Weight: 235 lb (106.595 kg) IBW/kg (Calculated) : 77.6 Heparin Dosing Weight:    Vital Signs: Temp: 98 F (36.7 C) (08/06 0543) Temp Source: Oral (08/06 0543) BP: 114/69 mmHg (08/06 0543) Pulse Rate: 90 (08/06 0543)  Labs:  Recent Labs  07/28/15 1050 07/28/15 1838 07/29/15 0839 07/29/15 1400 07/30/15 0332  HGB 14.8 12.9*  --   --   --   HCT 43.4 38.0*  --   --   --   PLT 260  --   --   --   --   LABPROT 47.0*  --   --  42.0* 27.5*  INR 5.27*  --   --  4.57* 2.60*  CREATININE 1.30*  --  1.42*  --   --     Estimated Creatinine Clearance: 76.8 mL/min (by C-G formula based on Cr of 1.42).   Assessment: 54 year old male who continues on Coumadin for a clot history INR has now trended into therapeutic range   Goal of Therapy:  INR 2-3 Monitor platelets by anticoagulation protocol: Yes   Plan:  Coumadin 5 mg po daily at 1800 pm Daily INR  Thank you Okey Regal, PharmD [phone] 07/30/2015,12:55 PM

## 2015-07-31 LAB — BODY FLUID CULTURE: CULTURE: NO GROWTH

## 2015-08-01 LAB — GC/CHLAMYDIA PROBE AMP (~~LOC~~) NOT AT ARMC
CHLAMYDIA, DNA PROBE: NEGATIVE
Chlamydia: NEGATIVE
NEISSERIA GONORRHEA: NEGATIVE
Neisseria Gonorrhea: NEGATIVE

## 2015-08-01 LAB — CYTOLOGY, (ORAL, ANAL, URETHRAL) ANCILLARY ONLY
Chlamydia: NEGATIVE
Neisseria Gonorrhea: NEGATIVE

## 2015-11-22 ENCOUNTER — Emergency Department (HOSPITAL_COMMUNITY)
Admission: EM | Admit: 2015-11-22 | Discharge: 2015-11-22 | Disposition: A | Discharge status: Home / Self Care | Payer: Medicare Other | Attending: Emergency Medicine | Admitting: Emergency Medicine

## 2015-11-22 ENCOUNTER — Encounter (HOSPITAL_COMMUNITY): Payer: Self-pay | Admitting: *Deleted

## 2015-11-22 DIAGNOSIS — Z86711 Personal history of pulmonary embolism: Secondary | ICD-10-CM | POA: Diagnosis not present

## 2015-11-22 DIAGNOSIS — Z8619 Personal history of other infectious and parasitic diseases: Secondary | ICD-10-CM | POA: Insufficient documentation

## 2015-11-22 DIAGNOSIS — H938X2 Other specified disorders of left ear: Secondary | ICD-10-CM

## 2015-11-22 DIAGNOSIS — H9190 Unspecified hearing loss, unspecified ear: Secondary | ICD-10-CM | POA: Diagnosis not present

## 2015-11-22 DIAGNOSIS — Z7952 Long term (current) use of systemic steroids: Secondary | ICD-10-CM | POA: Insufficient documentation

## 2015-11-22 DIAGNOSIS — J309 Allergic rhinitis, unspecified: Secondary | ICD-10-CM | POA: Diagnosis not present

## 2015-11-22 DIAGNOSIS — Z7951 Long term (current) use of inhaled steroids: Secondary | ICD-10-CM | POA: Insufficient documentation

## 2015-11-22 DIAGNOSIS — Z79899 Other long term (current) drug therapy: Secondary | ICD-10-CM | POA: Insufficient documentation

## 2015-11-22 DIAGNOSIS — Z8719 Personal history of other diseases of the digestive system: Secondary | ICD-10-CM | POA: Insufficient documentation

## 2015-11-22 DIAGNOSIS — B20 Human immunodeficiency virus [HIV] disease: Secondary | ICD-10-CM | POA: Diagnosis not present

## 2015-11-22 DIAGNOSIS — I252 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 infarction: Secondary | ICD-10-CM | POA: Insufficient documentation

## 2015-11-22 DIAGNOSIS — Z7901 Long term (current) use of anticoagulants: Secondary | ICD-10-CM | POA: Diagnosis not present

## 2015-11-22 DIAGNOSIS — Z86718 Personal history of other venous thrombosis and embolism: Secondary | ICD-10-CM | POA: Diagnosis not present

## 2015-11-22 DIAGNOSIS — Z880 Allergy status to penicillin: Secondary | ICD-10-CM | POA: Insufficient documentation

## 2015-11-22 DIAGNOSIS — Z91040 Latex allergy status: Secondary | ICD-10-CM | POA: Diagnosis not present

## 2015-11-22 MED ORDER — CETIRIZINE HCL 10 MG PO TABS: 10.0000 mg | ORAL_TABLET | Freq: Every day | ORAL | Status: DC

## 2015-11-22 MED ORDER — CIPROFLOXACIN-DEXAMETHASONE 0.3-0.1 % OT SUSP
4.0000 [drp] | Freq: Two times a day (BID) | OTIC | Status: AC
  Administered 2015-11-22: 4 [drp] via OTIC
  Filled 2015-11-22: qty 7.5

## 2015-11-22 NOTE — ED Notes (Signed)
Pt in c/o left ear being clogged and eye drainage for the last three weeks, worse today

## 2015-11-22 NOTE — Discharge Instructions (Signed)
You have been seen and evaluated for ear fullness.  You most likely have Eustachian tube dysfunction from swelling of upper airways from upper respiratory infection and/or allergies over the past 2-3 weeks.  Start taking an antihistamine once a day.  Try drops to treat irritation in the ear canal, although you do not appear to have an infection at this time needed oral antibiotics.  Please follow up with your PCP on Thursday, as scheduled, and have your ears rechecked.  Also follow up on your blood pressure.  Allergic Rhinitis Allergic rhinitis is when the mucous membranes in the nose respond to allergens. Allergens are particles in the air that cause your body to have an allergic reaction. This causes you to release allergic antibodies. Through a chain of events, these eventually cause you to release histamine into the blood stream. Although meant to protect the body, it is this release of histamine that causes your discomfort, such as frequent sneezing, congestion, and an itchy, runny nose.  CAUSES Seasonal allergic rhinitis (hay fever) is caused by pollen allergens that may come from grasses, trees, and weeds. Year-round allergic rhinitis (perennial allergic rhinitis) is caused by allergens such as house dust mites, pet dander, and mold spores. SYMPTOMS  Nasal stuffiness (congestion).  Itchy, runny nose with sneezing and tearing of the eyes. DIAGNOSIS Your health care provider can help you determine the allergen or allergens that trigger your symptoms. If you and your health care provider are unable to determine the allergen, skin or blood testing may be used. Your health care provider will diagnose your condition after taking your health history and performing a physical exam. Your health care provider may assess you for other related conditions, such as asthma, pink eye, or an ear infection. TREATMENT Allergic rhinitis does not have a cure, but it can be controlled by:  Medicines that block  allergy symptoms. These may include allergy shots, nasal sprays, and oral antihistamines.  Avoiding the allergen. Hay fever may often be treated with antihistamines in pill or nasal spray forms. Antihistamines block the effects of histamine. There are over-the-counter medicines that may help with nasal congestion and swelling around the eyes. Check with your health care provider before taking or giving this medicine. If avoiding the allergen or the medicine prescribed do not work, there are many new medicines your health care provider can prescribe. Stronger medicine may be used if initial measures are ineffective. Desensitizing injections can be used if medicine and avoidance does not work. Desensitization is when a patient is given ongoing shots until the body becomes less sensitive to the allergen. Make sure you follow up with your health care provider if problems continue. HOME CARE INSTRUCTIONS It is not possible to completely avoid allergens, but you can reduce your symptoms by taking steps to limit your exposure to them. It helps to know exactly what you are allergic to so that you can avoid your specific triggers. SEEK MEDICAL CARE IF:  You have a fever.  You develop a cough that does not stop easily (persistent).  You have shortness of breath.  You start wheezing.  Symptoms interfere with normal daily activities.   This information is not intended to replace advice given to you by your health care provider. Make sure you discuss any questions you have with your health care provider.   Document Released: 09/04/2001 Document Revised: 12/31/2014 Document Reviewed: 08/17/2013 Elsevier Interactive Patient Education [employer]2016 Elsevier Inc.

## 2015-11-22 NOTE — ED Provider Notes (Signed)
CSN: [id number]     Arrival date & time 11/22/15  2126 History  By signing my name below, I, Emmanuella Mensah, attest that this documentation has been prepared under the direction and in the presence of Danelle Berry, PA-C. Electronically Signed: Angelene Giovanni, ED Scribe. 11/22/2015. 10:24 PM.    Chief Complaint  Patient presents with  . Ear Fullness   The history is provided by the patient. No language interpreter was used.   HPI Comments: Craig [name] is a 54 y.o. male with a hx of HIV, AIDS,who presents to the Emergency Department complaining of a gradually worsening intermittent left ear fullness which began 3 weeks ago. He explains that he feels like his ear is "clogged up and there is an echo", it also pops when he moves from sitting to laying. He reports associated mild trouble hearing, eye itchiness, and eye drainage. He also adds that he had nasal congestion and sore throat 2 weeks ago. He denies sneezing, fever, or current sore throat. He states that he took Benadryl with no relief. He reports that he cleans his ears with peroxide sometimes. He reports that he is on blood thinners for his PE and DVT. He reports that he has an upcoming appointment with his PCP in 2 days.    Past Medical History  Diagnosis Date  . 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 Personal history of venous thrombosis and embolism   . Dysplasia of anus   . Esophageal reflux   . Horseshoe tear of retina without detachment 02-13-12  . 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  . Molluscum contagiosum 09-29-2012  . 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 infarction   . Other convulsions   . Unspecified hereditary and idiopathic peripheral neuropathy   . Secondary iridocyclitis, infectious   . 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 PE (pulmonary embolism) 04/09/2013    Overview:  Jan 2012   . 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 Hernia, inguinal 04/09/2013  . 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 Deep vein thrombosis (HCC) 05/09/2011    Overview:  Jan 2012   . AIDS (HCC) 05/24/2015  . Shortness of breath dyspnea   . Septic arthritis of wrist (HCC)     rt  . HIV (human immunodeficiency virus infection) [facility])    Past Surgical History  Procedure Laterality Date  . Colonoscopy    . I&d extremity Right 07/28/2015    Procedure: IRRIGATION AND DEBRIDEMENT WRIST;  Surgeon: Knute Neu, MD;  Location: [facility] OR;  Service: Plastics;  Laterality: Right;   History reviewed. No pertinent family history. Social History  Substance Use Topics  . Smoking status: Never Smoker   . Smokeless tobacco: Never Used  . Alcohol Use: No    Review of Systems  Constitutional: Negative for fever.  HENT: Positive for hearing loss (mild). Negative for ear discharge, sneezing and sore throat.        Ear fullness  Eyes: Positive for discharge and itching.      Allergies  Shellfish allergy; Penicillins; and Latex  Home Medications   Prior to Admission medications   Medication Sig Start Date End Date Taking? Authorizing Provider  albuterol (PROVENTIL HFA;VENTOLIN HFA) 108 (90 BASE) MCG/ACT inhaler Inhale 2 puffs into the lungs every 6 (six) hours as needed for wheezing or shortness of breath.    Historical Provider, MD  beclomethasone (QVAR) 40 MCG/ACT inhaler Inhale 1 puff into the lungs 2 (two) times daily as needed (shortness of breath).     Historical  Provider, MD  budesonide (RHINOCORT AQUA) 32 MCG/ACT nasal spray Place 2 sprays into both nostrils daily. 03/16/15   Charlestine Nighthristopher Lawyer, PA-C  cetirizine (ZYRTEC ALLERGY) 10 MG tablet Take 1 tablet (10 mg total) by mouth daily. 11/22/15   Danelle BerryLeisa Caylei Sperry, PA-C  citalopram (CELEXA) 40 MG tablet Take 40 mg by mouth daily.    Historical Provider, MD  clobetasol ointment (TEMOVATE) 0.05 % Apply 1 application topically 2 (two) times daily.  10/04/14   Historical Provider, MD  colchicine 0.6 MG tablet Take 0.6 mg by mouth 2 (two) times daily. 05/25/15    Historical Provider, MD  dolutegravir (TIVICAY) 50 MG tablet Take 1 tablet (50 mg total) by mouth daily. 03/03/15   Randall Hissornelius N Van Dam, MD  doxycycline (VIBRA-TABS) 100 MG tablet Take 1 tablet (100 mg total) by mouth every 12 (twelve) hours. 07/30/15   Gust RungErik C Hoffman, DO  emtricitabine-tenofovir (TRUVADA) 200-300 MG per tablet Take 1 tablet by mouth daily. 03/03/15   Randall Hissornelius N Van Dam, MD  hydrOXYzine (ATARAX/VISTARIL) 25 MG tablet Take 25 mg by mouth every 6 (six) hours as needed for anxiety.     Historical Provider, MD  imiquimod (ALDARA) 5 % cream Apply topically 3 (three) times a week.    Historical Provider, MD  lisinopril (PRINIVIL,ZESTRIL) 20 MG tablet Take 1 tablet (20 mg total) by mouth daily. 03/08/14   Gust RungErik C Hoffman, DO  triamcinolone ointment (KENALOG) 0.1 % Apply 1 application topically 2 (two) times daily as needed (for itching). On Face 05/12/15   Historical Provider, MD  warfarin (COUMADIN) 5 MG tablet Take 5-7.5 mg by mouth daily. M and Th take 1 1/2 tab (7.5mg ) all others days take 1 tab    Historical Provider, MD   BP 149/103 mmHg  Temp(Src) 98 F (36.7 C) (Oral)  Resp 18  Wt 112.209 kg  SpO2 95% Physical Exam  Constitutional: He is oriented to person, place, and time. He appears well-developed and well-nourished. No distress.  HENT:  Head: Normocephalic and atraumatic.  Right Ear: Hearing, tympanic membrane, external ear and ear canal normal.  Left Ear: Hearing, tympanic membrane and external ear normal.  Nose: Mucosal edema and rhinorrhea present. No epistaxis. Right sinus exhibits no maxillary sinus tenderness and no frontal sinus tenderness. Left sinus exhibits no maxillary sinus tenderness and no frontal sinus tenderness.  Mouth/Throat: Uvula is midline, oropharynx is clear and moist and mucous membranes are normal. Mucous membranes are not pale, not dry and not cyanotic. No oropharyngeal exudate, posterior oropharyngeal edema or posterior oropharyngeal erythema.  Nasal  mucosa was erythematous with clear nasal drainage.  Mild erythema of EAC, without edema or exudate  Eyes: Conjunctivae and EOM are normal. Pupils are equal, round, and reactive to light. Right eye exhibits no discharge. Left eye exhibits no discharge. No scleral icterus.  Neck: Normal range of motion. Neck supple. No JVD present. No tracheal deviation present.  Cardiovascular: Normal rate and regular rhythm.   Pulmonary/Chest: Effort normal and breath sounds normal. No stridor. No respiratory distress.  Musculoskeletal: Normal range of motion. He exhibits no edema.  Lymphadenopathy:       Head (right side): No submental, no submandibular, no tonsillar, no preauricular, no posterior auricular and no occipital adenopathy present.       Head (left side): No submental, no submandibular, no tonsillar, no preauricular, no posterior auricular and no occipital adenopathy present.    He has no cervical adenopathy.  Neurological: He is alert and oriented to person, place,  and time. He exhibits normal muscle tone. Coordination normal.  Skin: Skin is warm and dry. No rash noted. He is not diaphoretic. No erythema. No pallor.  Psychiatric: He has a normal mood and affect. His behavior is normal. Judgment and thought content normal.  Nursing note and vitals reviewed.   ED Course  Procedures (including critical care time) DIAGNOSTIC STUDIES: Oxygen Saturation is 95% on RA, adequate by my interpretation.    COORDINATION OF CARE: 10:20 PM- Pt advised of plan for treatment and pt agrees. Pt will receive ciprofloxacin. Recommended to try Zyrtec or Allegra instead of Benadryl due to the drowsiness.     MDM   Final diagnoses:  Allergic rhinitis, unspecified allergic rhinitis type  Ear fullness, left    Pt with "ear fullness"  On exam, bilateral TM's appear normal, without effusion.  There is mild canal erythema in his left ear.  He complains of URI 2 weeks ago and allergic rhinitis sx.  He has  erythematous nasal mucosa. He has no constitutional sx, such as fever, chills, sweats, cough, SOB, CP, face pain, facial swelling.   His sx are consistent with eustacian tube dysfunction. He was given zyrtec to use daily, and ciprodex drops to try for ear discomfort. He has follow up with his PCP in 2 days.    He was HTN 143/103, but was asymptomatic.  He will also follow up on his BP with his PCP.  I personally performed the services described in this documentation, which was scribed in my presence. The recorded information has been reviewed and is accurate.     Danelle Berry, PA-C 11/22/15 2235  Zadie Rhine, MD 11/23/15 1255

## 2015-11-25 ENCOUNTER — Telehealth: Payer: Self-pay | Admitting: Student-PharmD

## 2015-11-25 NOTE — Telephone Encounter (Signed)
error 

## 2015-12-21 ENCOUNTER — Emergency Department (HOSPITAL_COMMUNITY)
Admission: EM | Admit: 2015-12-21 | Discharge: 2015-12-22 | Disposition: A | Discharge status: Home / Self Care | Payer: Medicare Other | Attending: Emergency Medicine | Admitting: Emergency Medicine

## 2015-12-21 ENCOUNTER — Encounter (HOSPITAL_COMMUNITY): Payer: Self-pay | Admitting: Emergency Medicine

## 2015-12-21 DIAGNOSIS — Y9389 Activity, other specified: Secondary | ICD-10-CM | POA: Insufficient documentation

## 2015-12-21 DIAGNOSIS — Z7951 Long term (current) use of inhaled steroids: Secondary | ICD-10-CM | POA: Diagnosis not present

## 2015-12-21 DIAGNOSIS — Z8669 Personal history of other diseases of the nervous system and sense organs: Secondary | ICD-10-CM | POA: Diagnosis not present

## 2015-12-21 DIAGNOSIS — Z7901 Long term (current) use of anticoagulants: Secondary | ICD-10-CM | POA: Insufficient documentation

## 2015-12-21 DIAGNOSIS — L089 Local infection of the skin and subcutaneous tissue, unspecified: Secondary | ICD-10-CM | POA: Insufficient documentation

## 2015-12-21 DIAGNOSIS — I252 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 infarction: Secondary | ICD-10-CM | POA: Insufficient documentation

## 2015-12-21 DIAGNOSIS — E139 Other specified diabetes mellitus without complications: Secondary | ICD-10-CM | POA: Insufficient documentation

## 2015-12-21 DIAGNOSIS — S71101A Unspecified open wound, right thigh, initial encounter: Secondary | ICD-10-CM | POA: Diagnosis not present

## 2015-12-21 DIAGNOSIS — Z8619 Personal history of other infectious and parasitic diseases: Secondary | ICD-10-CM | POA: Diagnosis not present

## 2015-12-21 DIAGNOSIS — Z91040 Latex allergy status: Secondary | ICD-10-CM | POA: Diagnosis not present

## 2015-12-21 DIAGNOSIS — Z86718 Personal history of other venous thrombosis and embolism: Secondary | ICD-10-CM | POA: Diagnosis not present

## 2015-12-21 DIAGNOSIS — B20 Human immunodeficiency virus [HIV] disease: Secondary | ICD-10-CM | POA: Insufficient documentation

## 2015-12-21 DIAGNOSIS — Y998 Other external cause status: Secondary | ICD-10-CM | POA: Insufficient documentation

## 2015-12-21 DIAGNOSIS — Z86711 Personal history of pulmonary embolism: Secondary | ICD-10-CM | POA: Diagnosis not present

## 2015-12-21 DIAGNOSIS — Y9289 Other specified places as the place of occurrence of the external cause: Secondary | ICD-10-CM | POA: Insufficient documentation

## 2015-12-21 DIAGNOSIS — Z880 Allergy status to penicillin: Secondary | ICD-10-CM | POA: Insufficient documentation

## 2015-12-21 DIAGNOSIS — Z79899 Other long term (current) drug therapy: Secondary | ICD-10-CM | POA: Diagnosis not present

## 2015-12-21 DIAGNOSIS — X58XXXA Exposure to other specified factors, initial encounter: Secondary | ICD-10-CM | POA: Diagnosis not present

## 2015-12-21 DIAGNOSIS — Z7952 Long term (current) use of systemic steroids: Secondary | ICD-10-CM | POA: Insufficient documentation

## 2015-12-21 DIAGNOSIS — R358 Other polyuria: Secondary | ICD-10-CM | POA: Diagnosis present

## 2015-12-21 DIAGNOSIS — G609 Hereditary and idiopathic neuropathy, unspecified: Secondary | ICD-10-CM | POA: Diagnosis not present

## 2015-12-21 LAB — URINE MICROSCOPIC-ADD ON: Bacteria, UA: NONE SEEN

## 2015-12-21 LAB — COMPREHENSIVE METABOLIC PANEL
ALBUMIN: 3.8 g/dL (ref 3.5–5.0)
ALT: 51 U/L (ref 17–63)
ANION GAP: 11 (ref 5–15)
AST: 36 U/L (ref 15–41)
Alkaline Phosphatase: 107 U/L (ref 38–126)
BUN: 12 mg/dL (ref 6–20)
CHLORIDE: 93 mmol/L — AB (ref 101–111)
CO2: 24 mmol/L (ref 22–32)
Calcium: 9.3 mg/dL (ref 8.9–10.3)
Creatinine, Ser: 1.47 mg/dL — ABNORMAL HIGH (ref 0.61–1.24)
GFR calc Af Amer: >60 mL/min (ref >60)
GFR calc non Af Amer: 53 mL/min — ABNORMAL LOW (ref >60)
Glucose, Bld: 685 mg/dL (ref 65–99)
Potassium: 4.4 mmol/L (ref 3.5–5.1)
SODIUM: 128 mmol/L — AB (ref 135–145)
Total Bilirubin: 0.9 mg/dL (ref 0.3–1.2)
Total Protein: 8 g/dL (ref 6.5–8.1)

## 2015-12-21 LAB — CBC WITH DIFFERENTIAL/PLATELET
Basophils Absolute: 0 10*3/uL (ref 0.0–0.1)
Basophils Relative: 0 %
EOS ABS: 0.1 10*3/uL (ref 0.0–0.7)
Eosinophils Relative: 2 %
HCT: 46.2 % (ref 39.0–52.0)
Hemoglobin: 15.7 g/dL (ref 13.0–17.0)
LYMPHS ABS: 1.6 10*3/uL (ref 0.7–4.0)
Lymphocytes Relative: 26 %
MCH: 29.5 pg (ref 26.0–34.0)
MCHC: 34 g/dL (ref 30.0–36.0)
MCV: 86.7 fL (ref 78.0–100.0)
MONOS PCT: 7 %
Monocytes Absolute: 0.4 10*3/uL (ref 0.1–1.0)
Neutro Abs: 4 10*3/uL (ref 1.7–7.7)
Neutrophils Relative %: 65 %
PLATELETS: 188 10*3/uL (ref 150–400)
RBC: 5.33 MIL/uL (ref 4.22–5.81)
RDW: 13.6 % (ref 11.5–15.5)
WBC: 6.2 10*3/uL (ref 4.0–10.5)

## 2015-12-21 LAB — URINALYSIS, ROUTINE W REFLEX MICROSCOPIC
BILIRUBIN URINE: NEGATIVE
Glucose, UA: >1000 mg/dL — AB
Ketones, ur: NEGATIVE mg/dL
Leukocytes, UA: NEGATIVE
Nitrite: NEGATIVE
Protein, ur: NEGATIVE mg/dL
Specific Gravity, Urine: 1.038 — ABNORMAL HIGH (ref 1.005–1.030)
pH: 5.5 (ref 5.0–8.0)

## 2015-12-21 LAB — I-STAT VENOUS BLOOD GAS, ED
ACID-BASE EXCESS: 3 mmol/L — AB (ref 0.0–2.0)
BICARBONATE: 27.1 meq/L — AB (ref 20.0–24.0)
O2 Saturation: 47 %
PH VEN: 7.449 — AB (ref 7.250–7.300)
PO2 VEN: 24 mmHg — AB (ref 30.0–45.0)
TCO2: 28 mmol/L (ref 0–100)
pCO2, Ven: 39.1 mmHg — ABNORMAL LOW (ref 45.0–50.0)

## 2015-12-21 LAB — CBG MONITORING, ED: GLUCOSE-CAPILLARY: 416 mg/dL — AB (ref 65–99)

## 2015-12-21 MED ORDER — SODIUM CHLORIDE 0.9 % IV BOLUS (SEPSIS)
1000.0000 mL | Freq: Once | INTRAVENOUS | Status: AC
  Administered 2015-12-21: 1000 mL via INTRAVENOUS

## 2015-12-21 MED ORDER — INSULIN ASPART 100 UNIT/ML ~~LOC~~ SOLN
10.0000 [IU] | Freq: Once | SUBCUTANEOUS | Status: AC
  Administered 2015-12-21: 10 [IU] via INTRAVENOUS
  Filled 2015-12-21: qty 1

## 2015-12-21 NOTE — ED Notes (Signed)
Cup of ice given.  

## 2015-12-21 NOTE — ED Notes (Signed)
Pt from home for eval of increased thirst, dry mouth, and polyuria. Pt denies any hx of DM but wants to be checked for DM and also prostate problems. Pt also reports multiple boils on bilateral legs and groin area, minor drainage noted. nad ntoed.

## 2015-12-21 NOTE — ED Notes (Signed)
Pt asking  For food but settled on  A cup of ice

## 2015-12-21 NOTE — ED Provider Notes (Signed)
CSN: [id number]     Arrival date & time 12/21/15  4098 History   First MD Initiated Contact with Patient 12/21/15 2115     Chief Complaint  Patient presents with  . Polyuria  . Recurrent Skin Infections     (Consider location/radiation/quality/duration/timing/severity/associated sxs/prior Treatment) The history is provided by the patient and medical records.    54 year old male with history of HIV, GERD, history of PE on Coumadin, presenting to the ED for polyuria and polydipsia for the past week. Patient states he is continuously drinking water and urinates approximately 20 times a day. He states he came in because he felt as though there was something wrong with his prostate. He states he has no difficulty initiating urinary stream, no dribbling. He denies any dysuria or hematuria. No abdominal pain or flank pain. He has had some nausea but has continued eating normally. Patient states he also has multiple sores to his legs. He states he has a long-standing history of the same, but has noticed more wounds over the past week. He denies history of MRSA.   Patient is followed by infectious disease, Dr. Zenaida Niece dam-- states he has been taking all of his medications as directed.  Last CD4 count 550 in May 2016 and viral load was undetectable at that time. He denies any fever or chills.  Patient does not have diagnosis of diabetes.  Past Medical History  Diagnosis Date  . 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 Personal history of venous thrombosis and embolism   . Dysplasia of anus   . Esophageal reflux   . Horseshoe tear of retina without detachment 02-13-12  . 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  . Molluscum contagiosum 09-29-2012  . 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 infarction   . Other convulsions   . Unspecified hereditary and idiopathic peripheral neuropathy   . Secondary iridocyclitis, infectious   . 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 PE (pulmonary embolism) 04/09/2013    Overview:  Jan 2012   . 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 Hernia, inguinal 04/09/2013  . 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 Deep vein thrombosis (HCC) 05/09/2011    Overview:  Jan 2012   . AIDS (HCC) 05/24/2015  . Shortness of breath dyspnea   . Septic arthritis of wrist (HCC)     rt  . HIV (human immunodeficiency virus infection) [facility])    Past Surgical History  Procedure Laterality Date  . Colonoscopy    . I&d extremity Right 07/28/2015    Procedure: IRRIGATION AND DEBRIDEMENT WRIST;  Surgeon: Knute Neu, MD;  Location: [facility] OR;  Service: Plastics;  Laterality: Right;   History reviewed. No pertinent family history. Social History  Substance Use Topics  . Smoking status: Never Smoker   . Smokeless tobacco: Never Used  . Alcohol Use: No    Review of Systems  Endocrine: Positive for polydipsia and polyuria.  All other systems reviewed and are negative.     Allergies  Shellfish allergy; Penicillins; and Latex  Home Medications   Prior to Admission medications   Medication Sig Start Date End Date Taking? Authorizing Provider  albuterol (PROVENTIL HFA;VENTOLIN HFA) 108 (90 BASE) MCG/ACT inhaler Inhale 2 puffs into the lungs every 6 (six) hours as needed for wheezing or shortness of breath.    Historical Provider, MD  beclomethasone (QVAR) 40 MCG/ACT inhaler Inhale 1 puff into the lungs 2 (two) times daily as needed (shortness of breath).     Historical Provider, MD  budesonide (RHINOCORT  AQUA) 32 MCG/ACT nasal spray Place 2 sprays into both nostrils daily. 03/16/15   Charlestine Nighthristopher Lawyer, PA-C  cetirizine (ZYRTEC ALLERGY) 10 MG tablet Take 1 tablet (10 mg total) by mouth daily. 11/22/15   Danelle BerryLeisa Tapia, PA-C  citalopram (CELEXA) 40 MG tablet Take 40 mg by mouth daily.    Historical Provider, MD  clobetasol ointment (TEMOVATE) 0.05 % Apply 1 application topically 2 (two) times daily.  10/04/14   Historical Provider, MD  colchicine 0.6 MG tablet Take  0.6 mg by mouth 2 (two) times daily. 05/25/15   Historical Provider, MD  dolutegravir (TIVICAY) 50 MG tablet Take 1 tablet (50 mg total) by mouth daily. 03/03/15   Randall Hissornelius N Van Dam, MD  doxycycline (VIBRA-TABS) 100 MG tablet Take 1 tablet (100 mg total) by mouth every 12 (twelve) hours. 07/30/15   Gust RungErik C Hoffman, DO  emtricitabine-tenofovir (TRUVADA) 200-300 MG per tablet Take 1 tablet by mouth daily. 03/03/15   Randall Hissornelius N Van Dam, MD  hydrOXYzine (ATARAX/VISTARIL) 25 MG tablet Take 25 mg by mouth every 6 (six) hours as needed for anxiety.     Historical Provider, MD  imiquimod (ALDARA) 5 % cream Apply topically 3 (three) times a week.    Historical Provider, MD  lisinopril (PRINIVIL,ZESTRIL) 20 MG tablet Take 1 tablet (20 mg total) by mouth daily. 03/08/14   Gust RungErik C Hoffman, DO  triamcinolone ointment (KENALOG) 0.1 % Apply 1 application topically 2 (two) times daily as needed (for itching). On Face 05/12/15   Historical Provider, MD  warfarin (COUMADIN) 5 MG tablet Take 5-7.5 mg by mouth daily. M and Th take 1 1/2 tab (7.5mg ) all others days take 1 tab    Historical Provider, MD   BP 138/90 mmHg  Pulse 104  Temp(Src) 98.1 F (36.7 C) (Oral)  Resp 16  Ht 6' (1.829 m)  SpO2 98%   Physical Exam  Constitutional: He is oriented to person, place, and time. He appears well-developed and well-nourished.  HENT:  Head: Normocephalic and atraumatic.  Mouth/Throat: Oropharynx is clear and moist.  Dry mucous membranes  Eyes: Conjunctivae and EOM are normal. Pupils are equal, round, and reactive to light.  Neck: Normal range of motion.  Cardiovascular: Normal rate, regular rhythm and normal heart sounds.   Pulmonary/Chest: Effort normal and breath sounds normal.  Abdominal: Soft. Bowel sounds are normal.  Musculoskeletal: Normal range of motion.  2 open wounds to right leg-- 1 medial thigh, 1 lateral thigh, no active drainage in either wound, locally tender without significant cellulitis, no lymphangitis   Neurological: He is alert and oriented to person, place, and time.  Skin: Skin is warm and dry.  Psychiatric: He has a normal mood and affect.  Nursing note and vitals reviewed.   ED Course  Procedures (including critical care time) Labs Review Labs Reviewed  COMPREHENSIVE METABOLIC PANEL - Abnormal; Notable for the following:    Sodium 128 (*)    Chloride 93 (*)    Glucose, Bld 685 (*)    Creatinine, Ser 1.47 (*)    GFR calc non Af Amer 53 (*)    All other components within normal limits  URINALYSIS, ROUTINE W REFLEX MICROSCOPIC (NOT [facility]) - Abnormal; Notable for the following:    Specific Gravity, Urine 1.038 (*)    Glucose, UA >1000 (*)    Hgb urine dipstick TRACE (*)    All other components within normal limits  URINE MICROSCOPIC-ADD ON - Abnormal; Notable for the following:  Squamous Epithelial / LPF 0-5 (*)    All other components within normal limits  CBG MONITORING, ED - Abnormal; Notable for the following:    Glucose-Capillary 416 (*)    All other components within normal limits  I-STAT VENOUS BLOOD GAS, ED - Abnormal; Notable for the following:    pH, Ven 7.449 (*)    pCO2, Ven 39.1 (*)    pO2, Ven 24.0 (*)    Bicarbonate 27.1 (*)    Acid-Base Excess 3.0 (*)    All other components within normal limits  CBG MONITORING, ED - Abnormal; Notable for the following:    Glucose-Capillary 314 (*)    All other components within normal limits  CBC WITH DIFFERENTIAL/PLATELET    Imaging Review No results found. I have personally reviewed and evaluated these images and lab results as part of my medical decision-making.   EKG Interpretation None      MDM   Final diagnoses:  Diabetes mellitus of other type without complication (HCC)  Skin infection   54 y.o. M here with polyuria, polydipsia, and nausea for the past week.  His glucose is significantly elevated at 685.  His anion gap remains WNL at 11, bicarb also normal.  This is consistent with new onset  diabetes.  VBG will be sent.  Patient started on 2L IVFB.  Will aim for glucose control.  VBG was sent and is reassuring. Patient was fluid resuscitated with 3 L normal saline and given dose of IV insulin with improvement of glucose to 314.  Feel patient is stable for discharge. He'll be started on metformin as well as Bactrim for boils on his legs. I strongly encouraged him to follow-up with his primary care physician later this week to ensure close monitoring of his new onset diabetes.  Discussed plan with patient, he/she acknowledged understanding and agreed with plan of care.  Return precautions given for new or worsening symptoms.  Of note-- patient had oxygen saturations documented at 86-89% however i feel these are erroneous readings.  All other VS readings have been stable and patient has no complaint of chest pain, SOB, or cough, and displayed no signs of respiratory distress throughout entire ED visit.  Garlon Hatchet, PA-C 12/22/15 1610  Zadie Rhine, MD 12/23/15 [phone]

## 2015-12-22 DIAGNOSIS — E139 Other specified diabetes mellitus without complications: Secondary | ICD-10-CM | POA: Diagnosis not present

## 2015-12-22 LAB — CBG MONITORING, ED: GLUCOSE-CAPILLARY: 314 mg/dL — AB (ref 65–99)

## 2015-12-22 MED ORDER — METFORMIN HCL 500 MG PO TABS: 500.0000 mg | ORAL_TABLET | Freq: Two times a day (BID) | ORAL | Status: DC

## 2015-12-22 MED ORDER — SULFAMETHOXAZOLE-TRIMETHOPRIM 800-160 MG PO TABS: 1.0000 | ORAL_TABLET | Freq: Two times a day (BID) | ORAL | Status: AC

## 2015-12-22 NOTE — Discharge Instructions (Signed)
Take the prescribed medication as directed.  Metformin may give you some diarrhea which is normal. Follow-up with your primary care physician-- it is important that your started on a diabetes regimen and this is monitored carefully. I recommend to see her doctor within the next week. Return to the ED for new or worsening symptoms.

## 2016-01-05 IMAGING — CR DG CHEST 2V
2 series · 2 of 2 positions shown · non-contrast
Comparison: 11/21/2014

CLINICAL DATA: Left foot swelling for several days. The patient
takes Coumadin for blood clots. Chest pain earlier today has gotten
better.

EXAM:
CHEST  2 VIEW

[chest pa]
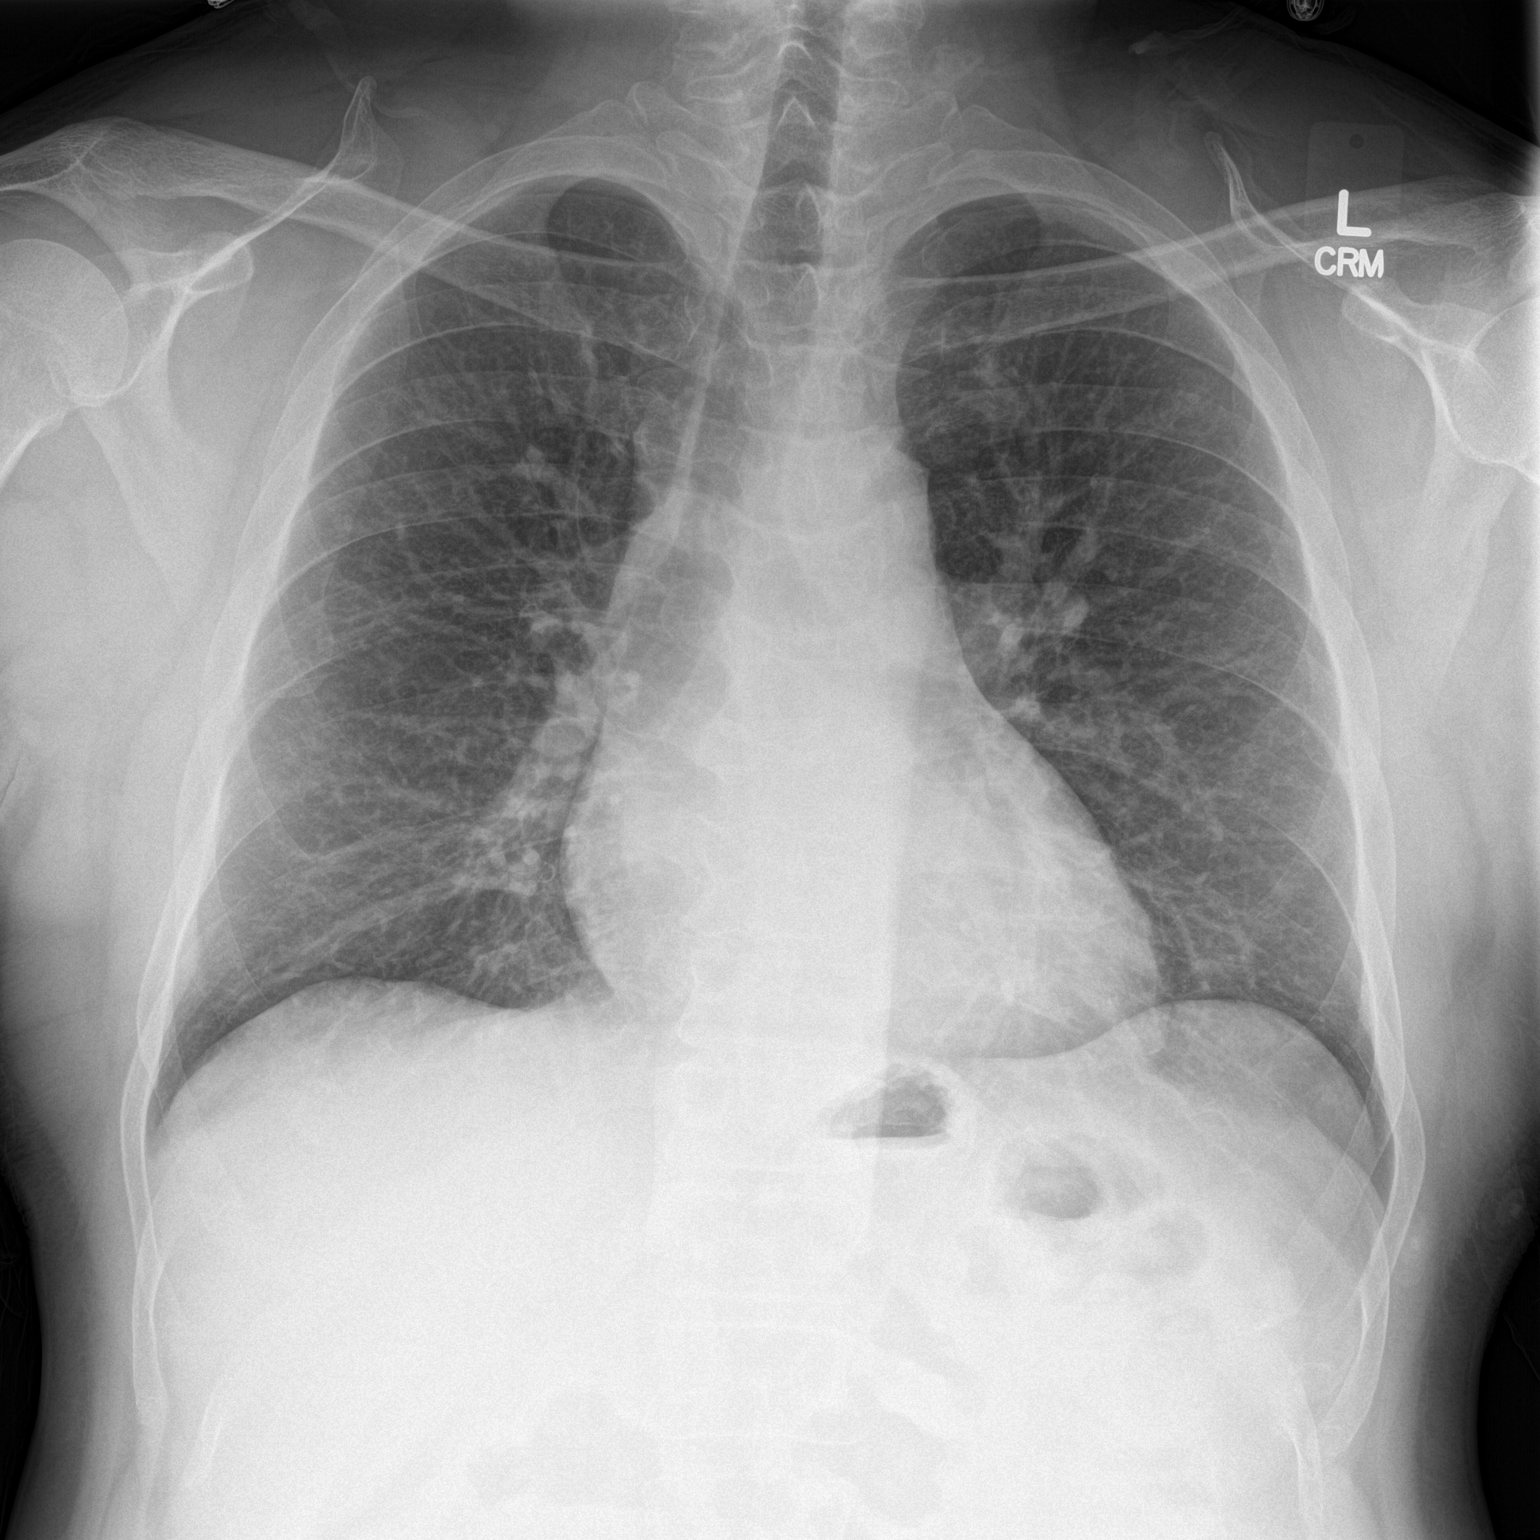

[chest lat]
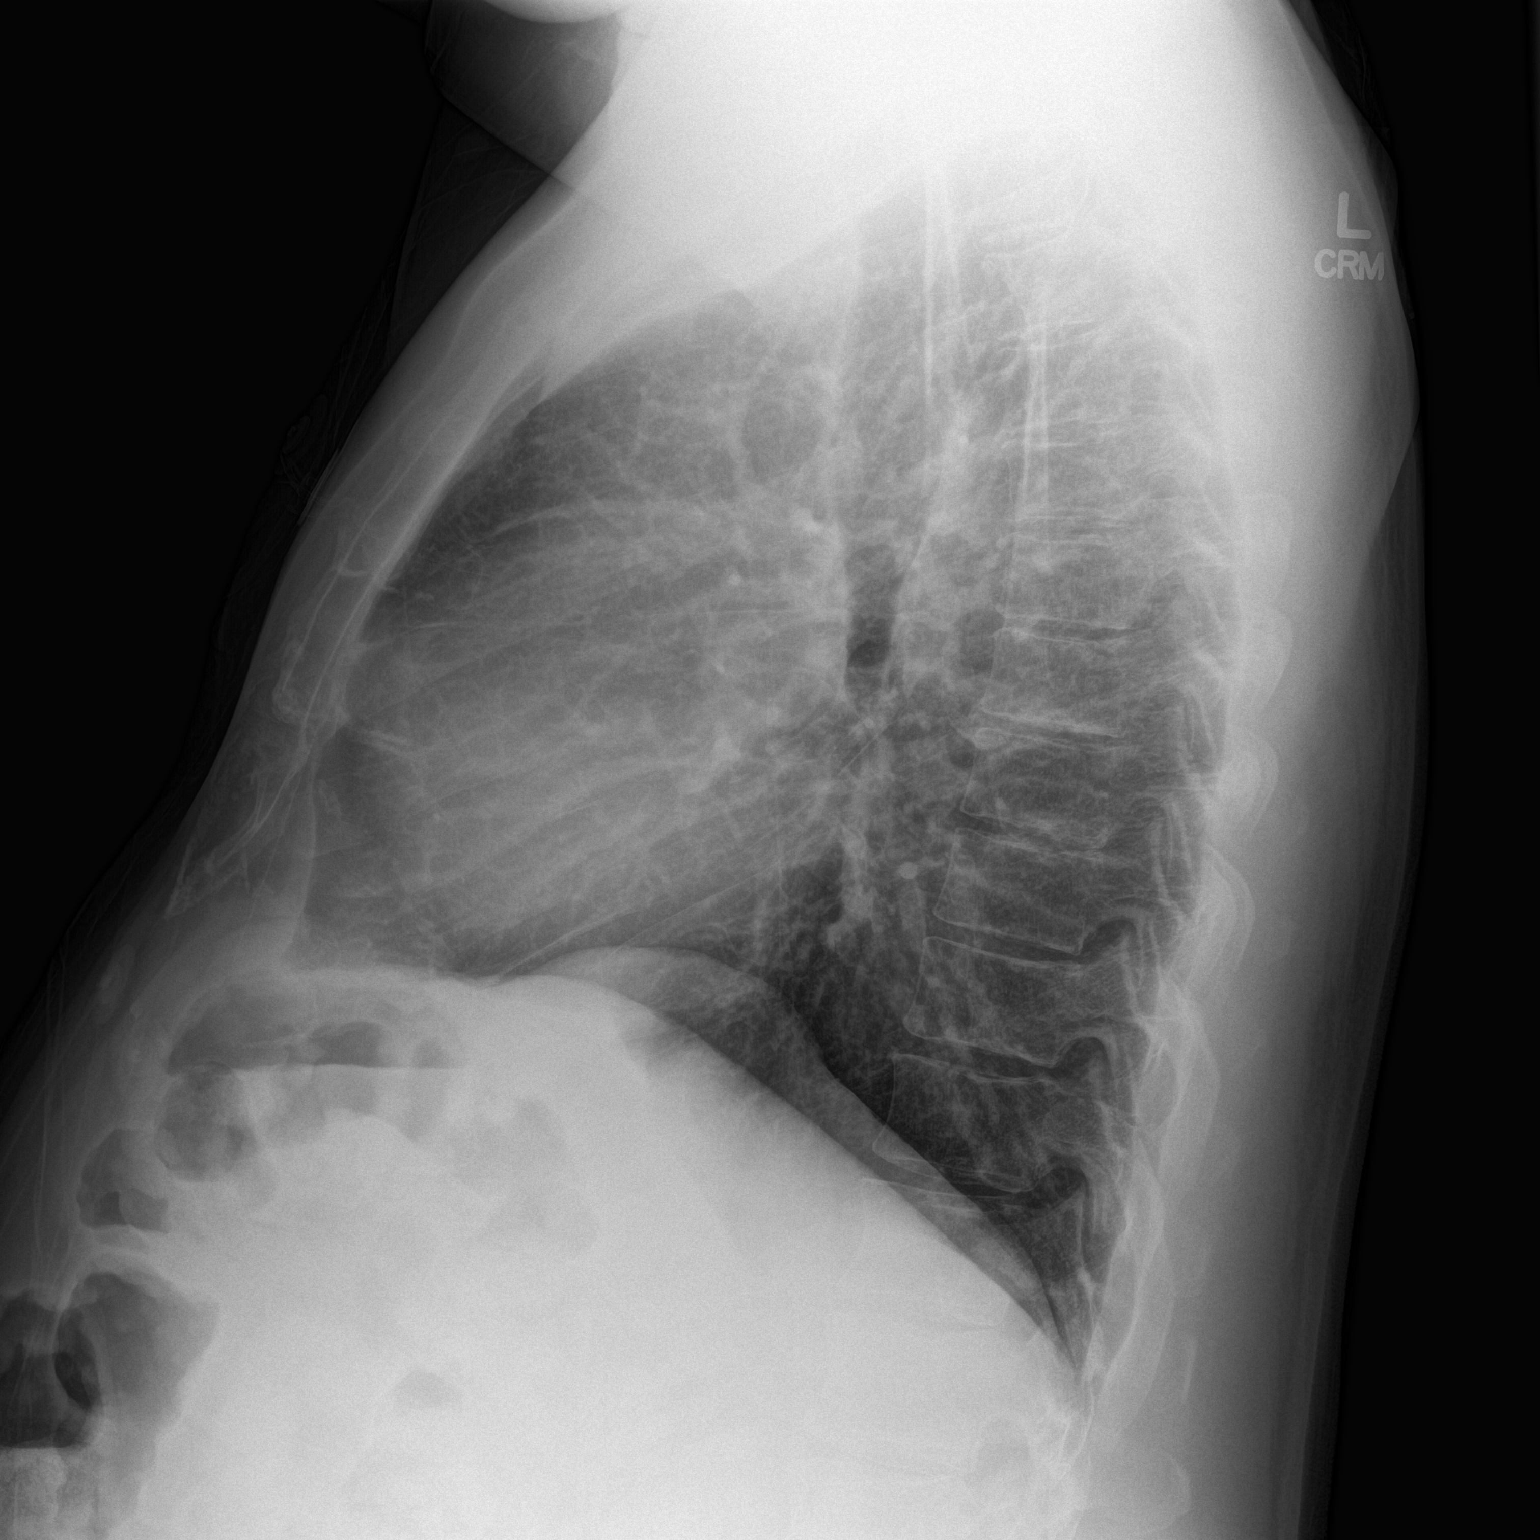

[2 of 2 positions shown; findings below may reference images not displayed]

FINDINGS: The heart size and mediastinal contours are within normal limits.
Both lungs are clear. The visualized skeletal structures are
unremarkable.
IMPRESSION: No active cardiopulmonary disease.

## 2016-01-12 ENCOUNTER — Other Ambulatory Visit: Payer: Medicare Other

## 2016-01-12 ENCOUNTER — Other Ambulatory Visit (HOSPITAL_COMMUNITY)
Admission: RE | Admit: 2016-01-12 | Discharge: 2016-01-12 | Disposition: A | Discharge status: Home / Self Care | Payer: Medicare Other | Source: Ambulatory Visit | Attending: Infectious Disease | Admitting: Infectious Disease

## 2016-01-12 DIAGNOSIS — Z21 Asymptomatic human immunodeficiency virus [HIV] infection status: Secondary | ICD-10-CM

## 2016-01-12 DIAGNOSIS — B20 Human immunodeficiency virus [HIV] disease: Secondary | ICD-10-CM

## 2016-01-12 DIAGNOSIS — Z86711 Personal history of pulmonary embolism: Secondary | ICD-10-CM

## 2016-01-12 DIAGNOSIS — Z113 Encounter for screening for infections with a predominantly sexual mode of transmission: Secondary | ICD-10-CM | POA: Insufficient documentation

## 2016-01-12 DIAGNOSIS — M1000 Idiopathic gout, unspecified site: Secondary | ICD-10-CM

## 2016-01-12 DIAGNOSIS — I9589 Other hypotension: Secondary | ICD-10-CM

## 2016-01-12 DIAGNOSIS — Z86718 Personal history of other venous thrombosis and embolism: Secondary | ICD-10-CM

## 2016-01-12 DIAGNOSIS — E785 Hyperlipidemia, unspecified: Secondary | ICD-10-CM

## 2016-01-12 DIAGNOSIS — L309 Dermatitis, unspecified: Secondary | ICD-10-CM

## 2016-01-12 LAB — CBC WITH DIFFERENTIAL/PLATELET
Basophils Absolute: 0 10*3/uL (ref 0.0–0.1)
Basophils Relative: 0 % (ref 0–1)
Eosinophils Absolute: 0 10*3/uL (ref 0.0–0.7)
Eosinophils Relative: 0 % (ref 0–5)
HCT: 42.4 % (ref 39.0–52.0)
HEMOGLOBIN: 14.2 g/dL (ref 13.0–17.0)
LYMPHS PCT: 12 % (ref 12–46)
Lymphs Abs: 0.9 10*3/uL (ref 0.7–4.0)
MCH: 29.1 pg (ref 26.0–34.0)
MCHC: 33.5 g/dL (ref 30.0–36.0)
MCV: 86.9 fL (ref 78.0–100.0)
MONOS PCT: 4 % (ref 3–12)
MPV: 9.8 fL (ref 8.6–12.4)
Monocytes Absolute: 0.3 10*3/uL (ref 0.1–1.0)
NEUTROS ABS: 6.3 10*3/uL (ref 1.7–7.7)
NEUTROS PCT: 84 % — AB (ref 43–77)
Platelets: 198 10*3/uL (ref 150–400)
RBC: 4.88 MIL/uL (ref 4.22–5.81)
RDW: 15.5 % (ref 11.5–15.5)
WBC: 7.5 10*3/uL (ref 4.0–10.5)

## 2016-01-12 LAB — COMPLETE METABOLIC PANEL WITH GFR
ALBUMIN: 3.8 g/dL (ref 3.6–5.1)
ALK PHOS: 64 U/L (ref 40–115)
ALT: 42 U/L (ref 9–46)
AST: 26 U/L (ref 10–35)
BUN: 10 mg/dL (ref 7–25)
CALCIUM: 9.7 mg/dL (ref 8.6–10.3)
CO2: 25 mmol/L (ref 20–31)
CREATININE: 0.96 mg/dL (ref 0.70–1.33)
Chloride: 104 mmol/L (ref 98–110)
GFR, Est African American: >89 mL/min (ref >=60)
GFR, Est Non African American: >89 mL/min (ref >=60)
Glucose, Bld: 133 mg/dL — ABNORMAL HIGH (ref 65–99)
POTASSIUM: 4 mmol/L (ref 3.5–5.3)
Sodium: 136 mmol/L (ref 135–146)
Total Bilirubin: 0.3 mg/dL (ref 0.2–1.2)
Total Protein: 7 g/dL (ref 6.1–8.1)

## 2016-01-12 LAB — LIPID PANEL
CHOL/HDL RATIO: 2.9 ratio (ref <=5.0)
CHOLESTEROL: 156 mg/dL (ref 125–200)
HDL: 54 mg/dL (ref >=40)
LDL Cholesterol: 76 mg/dL (ref <130)
Triglycerides: 131 mg/dL (ref <150)
VLDL: 26 mg/dL (ref <30)

## 2016-01-13 LAB — URINE CYTOLOGY ANCILLARY ONLY
CHLAMYDIA, DNA PROBE: NEGATIVE
Neisseria Gonorrhea: NEGATIVE

## 2016-01-13 LAB — RPR

## 2016-01-13 LAB — T-HELPER CELL (CD4) - (RCID CLINIC ONLY)
CD4 % Helper T Cell: 28 % — ABNORMAL LOW (ref 33–55)
CD4 T CELL ABS: 280 /uL — AB (ref 400–2700)

## 2016-01-13 LAB — HEPATITIS C ANTIBODY: HCV AB: NEGATIVE

## 2016-01-16 LAB — HIV-1 RNA QUANT-NO REFLEX-BLD

## 2016-01-25 ENCOUNTER — Encounter: Payer: Self-pay | Admitting: Infectious Disease

## 2016-01-25 ENCOUNTER — Ambulatory Visit: Payer: Medicare Other | Admitting: *Deleted

## 2016-01-25 ENCOUNTER — Ambulatory Visit (INDEPENDENT_AMBULATORY_CARE_PROVIDER_SITE_OTHER): Payer: Medicare Other | Admitting: Infectious Disease

## 2016-01-25 VITALS — BP 152/89 | HR 91 | Temp 98.0°F | Ht 72.0 in | Wt 239.0 lb

## 2016-01-25 DIAGNOSIS — Z21 Asymptomatic human immunodeficiency virus [HIV] infection status: Secondary | ICD-10-CM | POA: Diagnosis not present

## 2016-01-25 DIAGNOSIS — E785 Hyperlipidemia, unspecified: Secondary | ICD-10-CM | POA: Diagnosis not present

## 2016-01-25 DIAGNOSIS — E119 Type 2 diabetes mellitus without complications: Secondary | ICD-10-CM

## 2016-01-25 DIAGNOSIS — B20 Human immunodeficiency virus [HIV] disease: Secondary | ICD-10-CM | POA: Diagnosis not present

## 2016-01-25 DIAGNOSIS — L309 Dermatitis, unspecified: Secondary | ICD-10-CM

## 2016-01-25 DIAGNOSIS — Z23 Encounter for immunization: Secondary | ICD-10-CM

## 2016-01-25 DIAGNOSIS — F4323 Adjustment disorder with mixed anxiety and depressed mood: Secondary | ICD-10-CM

## 2016-01-25 DIAGNOSIS — Z794 Long term (current) use of insulin: Secondary | ICD-10-CM | POA: Insufficient documentation

## 2016-01-25 DIAGNOSIS — J302 Other seasonal allergic rhinitis: Secondary | ICD-10-CM

## 2016-01-25 DIAGNOSIS — Z86711 Personal history of pulmonary embolism: Secondary | ICD-10-CM

## 2016-01-25 DIAGNOSIS — E0822 Diabetes mellitus due to underlying condition with diabetic chronic kidney disease: Secondary | ICD-10-CM

## 2016-01-25 DIAGNOSIS — I9589 Other hypotension: Secondary | ICD-10-CM

## 2016-01-25 DIAGNOSIS — Z86718 Personal history of other venous thrombosis and embolism: Secondary | ICD-10-CM

## 2016-01-25 DIAGNOSIS — J309 Allergic rhinitis, unspecified: Secondary | ICD-10-CM

## 2016-01-25 DIAGNOSIS — M1000 Idiopathic gout, unspecified site: Secondary | ICD-10-CM

## 2016-01-25 DIAGNOSIS — IMO0002 Reserved for concepts with insufficient information to code with codable children: Secondary | ICD-10-CM | POA: Insufficient documentation

## 2016-01-25 DIAGNOSIS — I10 Essential (primary) hypertension: Secondary | ICD-10-CM

## 2016-01-25 HISTORY — DX: Allergic rhinitis, unspecified: J30.9

## 2016-01-25 HISTORY — DX: Type 2 diabetes mellitus without complications: E11.9

## 2016-01-25 MED ORDER — DOLUTEGRAVIR SODIUM 50 MG PO TABS: 50.0000 mg | ORAL_TABLET | Freq: Every day | ORAL | Status: DC

## 2016-01-25 MED ORDER — EMTRICITABINE-TENOFOVIR AF 200-25 MG PO TABS: 1.0000 | ORAL_TABLET | Freq: Every day | ORAL | Status: DC

## 2016-01-25 NOTE — Progress Notes (Signed)
Chief complaint: rhiniitis Subjective:    Patient ID: Craig [name], male    DOB: [birth year], 55 y.o.   MRN: [id number]  HPI   55 year old African American male with HIV, multiple medical problems including eczema (on MTX), DVT with PE, dCHF, HTN, ? MI, prior AKI previously cared for  at [facility] where he had been receiving his care.  I was able in fact to read my note on him from when I was his HIV provider at [facility] in 2006. He had been on a regimen of videx, Emtriva, and Reyataz 400 mg and was perfectly suppressed then was changed over to Reyataz, Norvir and truvada with apparent perfect virological suppression then change to n PRezista, Norvir and Truvada.   Since I last saw him he has had his ARV regimen changed by Dr. Cardell Peach who saw him and changed him to Tivicay and Truvada to avoid Norvir interaction with advair that he was being rx and to simplify his regimen.   He has maintained perfect virological suppression since then though CD4 has at times dipped it has generally been in the healthy range.  Lab Results  Component Value Date   HIV1RNAQUANT <20 01/12/2016     Lab Results  Component Value Date   CD4TABS 280* 01/12/2016   CD4TABS 550 05/10/2015   CD4TABS 430 11/22/2014    He has been dx with DM and rx with metformin  bid  He has been suffering from allergic rhinitis not controlled with flonase one spray each nose at night.  Past Medical History  Diagnosis Date  . 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 Personal history of venous thrombosis and embolism   . Dysplasia of anus   . Esophageal reflux   . Horseshoe tear of retina without detachment 02-13-12  . 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  . Molluscum contagiosum 09-29-2012  . 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 infarction   . Other convulsions   . Unspecified hereditary and idiopathic peripheral neuropathy   . Secondary iridocyclitis, infectious   . 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 PE (pulmonary embolism) 04/09/2013    Overview:  Jan  2012   . 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 Hernia, inguinal 04/09/2013  . 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 Deep vein thrombosis (HCC) 05/09/2011    Overview:  Jan 2012   . AIDS (HCC) 05/24/2015  . Shortness of breath dyspnea   . Septic arthritis of wrist (HCC)     rt  . HIV (human immunodeficiency virus infection) [facility])     Past Surgical History  Procedure Laterality Date  . Colonoscopy    . I&d extremity Right 07/28/2015    Procedure: IRRIGATION AND DEBRIDEMENT WRIST;  Surgeon: Knute Neu, MD;  Location: [facility] OR;  Service: Plastics;  Laterality: Right;    No family history on file.    Social History   Social History  . Marital Status: Single    Spouse Name: N/A  . Number of Children: N/A  . Years of Education: N/A   Social History Main Topics  . Smoking status: Never Smoker   . Smokeless tobacco: Never Used  . Alcohol Use: No  . Drug Use: No  . Sexual Activity:    Partners: Male    Birth Control/ Protection: Condom   Other Topics Concern  . None   Social History Narrative    Allergies  Allergen Reactions  . Shellfish Allergy Shortness Of Breath  . Penicillins Hives    Has patient had  a PCN reaction causing immediate rash, facial/tongue/throat swelling, SOB or lightheadedness with hypotension:YES Has patient had a PCN reaction causing Has patient had a PCN reaction that required hospitalization NO Has patient had a PCN reaction occurring within the last 10 years: NO If all of the above answers are "NO", then may proceed with Cephalosporin use.  . Latex Rash     Current outpatient prescriptions:  .  albuterol (PROVENTIL HFA;VENTOLIN HFA) 108 (90 BASE) MCG/ACT inhaler, Inhale 2 puffs into the lungs every 6 (six) hours as needed for wheezing or shortness of breath., Disp: , Rfl:  .  beclomethasone (QVAR) 40 MCG/ACT inhaler, Inhale 1 puff into the lungs 2 (two) times daily as needed  (shortness of breath). , Disp: , Rfl:  .  budesonide (RHINOCORT AQUA) 32 MCG/ACT nasal spray, Place 2 sprays into both nostrils daily., Disp: 1 Bottle, Rfl: 0 .  cetirizine (ZYRTEC ALLERGY) 10 MG tablet, Take 1 tablet (10 mg total) by mouth daily., Disp: 30 tablet, Rfl: 1 .  citalopram (CELEXA) 40 MG tablet, Take 40 mg by mouth daily., Disp: , Rfl:  .  clobetasol ointment (TEMOVATE) 0.05 %, Apply 1 application topically 2 (two) times daily. , Disp: , Rfl:  .  dolutegravir (TIVICAY) 50 MG tablet, Take 1 tablet (50 mg total) by mouth daily., Disp: 30 tablet, Rfl: 11 .  glipiZIDE (GLUCOTROL XL) 2.5 MG 24 hr tablet, Take 2.5 mg by mouth., Disp: , Rfl:  .  hydrOXYzine (ATARAX/VISTARIL) 25 MG tablet, Take 25 mg by mouth every 6 (six) hours as needed for anxiety. , Disp: , Rfl:  .  imiquimod (ALDARA) 5 % cream, Apply topically 3 (three) times a week., Disp: , Rfl:  .  metFORMIN (GLUCOPHAGE) 500 MG tablet, Take 1 tablet (500 mg total) by mouth 2 (two) times daily with a meal., Disp: 60 tablet, Rfl: 1 .  triamcinolone ointment (KENALOG) 0.1 %, Apply 1 application topically 2 (two) times daily as needed (for itching). On Face, Disp: , Rfl: 0 .  warfarin (COUMADIN) 5 MG tablet, Take 5-7.5 mg by mouth daily. M and Th take 1 1/2 tab (7.5mg ) all others days take 1 tab, Disp: , Rfl:  .  emtricitabine-tenofovir AF (DESCOVY) 200-25 MG tablet, Take 1 tablet by mouth daily., Disp: 30 tablet, Rfl: 11 .  fluticasone (FLONASE) 50 MCG/ACT nasal spray, USE 1 SPRAY IEN D, Disp: , Rfl: 0 .  methotrexate (RHEUMATREX) 2.5 MG tablet, TK 4 TS Q WEEK, Disp: , Rfl: 1     Review of Systems  Constitutional: Negative for fever, chills, diaphoresis, activity change, appetite change, fatigue and unexpected weight change.  HENT: Negative for congestion, rhinorrhea, sinus pressure, sneezing, sore throat and trouble swallowing.   Eyes: Negative for photophobia and visual disturbance.  Respiratory: Negative for cough, chest  tightness, shortness of breath, wheezing and stridor.   Cardiovascular: Negative for chest pain, palpitations and leg swelling.  Gastrointestinal: Negative for nausea, vomiting, abdominal pain, diarrhea, constipation, blood in stool, abdominal distention and anal bleeding.  Genitourinary: Negative for dysuria, hematuria, flank pain and difficulty urinating.  Musculoskeletal: Positive for myalgias. Negative for back pain, joint swelling, arthralgias and gait problem.  Skin: Negative for color change, pallor, rash and wound.  Neurological: Negative for dizziness, tremors, weakness and light-headedness.  Hematological: Negative for adenopathy. Does not bruise/bleed easily.  Psychiatric/Behavioral: Negative for behavioral problems, confusion, sleep disturbance, dysphoric mood, decreased concentration and agitation.       Objective:   Physical Exam  Constitutional: He  is oriented to person, place, and time. He appears well-developed and well-nourished. No distress.  HENT:  Head: Normocephalic and atraumatic.  Mouth/Throat: Oropharynx is clear and moist. No oropharyngeal exudate.  Eyes: Conjunctivae and EOM are normal. No scleral icterus.  Neck: Normal range of motion. Neck supple. No JVD present.  Cardiovascular: Normal rate and regular rhythm.   Pulmonary/Chest: Effort normal and breath sounds normal. No respiratory distress.  Abdominal: Soft. He exhibits no distension.  Musculoskeletal: He exhibits edema. He exhibits no tenderness.  Lymphadenopathy:    He has no cervical adenopathy.  Neurological: He is alert and oriented to person, place, and time. He exhibits normal muscle tone. Coordination normal.  Skin: Skin is warm and dry. He is not diaphoretic. No erythema. No pallor.  Psychiatric: He has a normal mood and affect. His behavior is normal. Judgment and thought content normal.          Assessment & Plan:   #1 HIV: continue  Tivicay and change Truvada to Descovy   #2  Hypotension: was apparently due to lasix. Cr stable and as above will switch to Descovy  #3 Eczema: on MTX with folate by Dermatology. M  #4 DM: on metfomin now. Educated him about the fact that his TIvicay increases Metformin levels so IF he goes up to higher dose he may have higher risk of SE from metformin such as diarrhea etc. I willcc his PCP  #5 HTN: not at goal but will defer to PCP  #6 Allergic rhinitis: make sure he is on antihistamine and ok to increase dose of inhaled steroid for now

## 2016-01-25 NOTE — BH Specialist Note (Signed)
Counselor met with Craig [name] in the exam room today to discuss his history of depression.  Patient was oriented times four with good affect and dress.  Patient was alert and somewhat talkative.  Patient indicated that he felt like he was doing ok for the moment but shared bad things come around and he might give me a call.  Counselor reassured patient that he could make an appointment any time he felt he needed too. Patient stated that his support network was not as good as he would like and would save counselor's business card to make an appointment with.  Rolena Infante, MA [facility]

## 2016-01-26 ENCOUNTER — Ambulatory Visit: Payer: Medicare Other | Admitting: Infectious Disease

## 2016-03-10 ENCOUNTER — Other Ambulatory Visit: Payer: Self-pay | Admitting: Infectious Disease

## 2016-03-10 DIAGNOSIS — B20 Human immunodeficiency virus [HIV] disease: Secondary | ICD-10-CM

## 2016-03-10 MED ORDER — EMTRICITABINE-TENOFOVIR AF 200-25 MG PO TABS: 1.0000 | ORAL_TABLET | Freq: Every day | ORAL | Status: DC

## 2016-03-10 NOTE — Telephone Encounter (Signed)
Patient asked me on Friday about why his Descovy rx not to pharmacy I have written for it again and dc Truvada

## 2016-03-24 ENCOUNTER — Other Ambulatory Visit: Payer: Self-pay | Admitting: Infectious Disease

## 2016-06-17 ENCOUNTER — Emergency Department (HOSPITAL_COMMUNITY): Payer: Medicare Other

## 2016-06-17 ENCOUNTER — Emergency Department (HOSPITAL_COMMUNITY)
Admission: EM | Admit: 2016-06-17 | Discharge: 2016-06-18 | Disposition: A | Discharge status: Home / Self Care | Payer: Medicare Other | Attending: Emergency Medicine | Admitting: Emergency Medicine

## 2016-06-17 ENCOUNTER — Other Ambulatory Visit: Payer: Self-pay

## 2016-06-17 ENCOUNTER — Encounter (HOSPITAL_COMMUNITY): Payer: Self-pay | Admitting: *Deleted

## 2016-06-17 DIAGNOSIS — E119 Type 2 diabetes mellitus without complications: Secondary | ICD-10-CM | POA: Insufficient documentation

## 2016-06-17 DIAGNOSIS — M7989 Other specified soft tissue disorders: Secondary | ICD-10-CM | POA: Diagnosis present

## 2016-06-17 DIAGNOSIS — Z7901 Long term (current) use of anticoagulants: Secondary | ICD-10-CM | POA: Diagnosis not present

## 2016-06-17 DIAGNOSIS — R600 Localized edema: Secondary | ICD-10-CM | POA: Diagnosis not present

## 2016-06-17 DIAGNOSIS — I252 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 infarction: Secondary | ICD-10-CM | POA: Diagnosis not present

## 2016-06-17 DIAGNOSIS — Z21 Asymptomatic human immunodeficiency virus [HIV] infection status: Secondary | ICD-10-CM | POA: Insufficient documentation

## 2016-06-17 DIAGNOSIS — Z7984 Long term (current) use of oral hypoglycemic drugs: Secondary | ICD-10-CM | POA: Insufficient documentation

## 2016-06-17 DIAGNOSIS — Z91040 Latex allergy status: Secondary | ICD-10-CM | POA: Insufficient documentation

## 2016-06-17 LAB — CBC
HEMATOCRIT: 42.7 % (ref 39.0–52.0)
HEMOGLOBIN: 14 g/dL (ref 13.0–17.0)
MCH: 28.8 pg (ref 26.0–34.0)
MCHC: 32.8 g/dL (ref 30.0–36.0)
MCV: 87.9 fL (ref 78.0–100.0)
Platelets: 221 10*3/uL (ref 150–400)
RBC: 4.86 MIL/uL (ref 4.22–5.81)
RDW: 14.4 % (ref 11.5–15.5)
WBC: 5.9 10*3/uL (ref 4.0–10.5)

## 2016-06-17 NOTE — ED Notes (Signed)
The pt has red and swollen legs

## 2016-06-17 NOTE — ED Notes (Signed)
The pt is c/o bi-lateral leg swelling since Friday.  And some toe pain  Pt is a diabetic he takes coumadin

## 2016-06-18 ENCOUNTER — Ambulatory Visit (HOSPITAL_COMMUNITY): Payer: Medicare Other

## 2016-06-18 ENCOUNTER — Ambulatory Visit (HOSPITAL_BASED_OUTPATIENT_CLINIC_OR_DEPARTMENT_OTHER)
Admission: RE | Admit: 2016-06-18 | Discharge: 2016-06-18 | Disposition: A | Discharge status: Home / Self Care | Payer: Medicare Other | Source: Ambulatory Visit | Attending: Emergency Medicine | Admitting: Emergency Medicine

## 2016-06-18 DIAGNOSIS — Z86718 Personal history of other venous thrombosis and embolism: Secondary | ICD-10-CM

## 2016-06-18 DIAGNOSIS — R600 Localized edema: Secondary | ICD-10-CM | POA: Insufficient documentation

## 2016-06-18 LAB — BASIC METABOLIC PANEL
ANION GAP: 8 (ref 5–15)
BUN: 12 mg/dL (ref 6–20)
CALCIUM: 9.3 mg/dL (ref 8.9–10.3)
CHLORIDE: 103 mmol/L (ref 101–111)
CO2: 26 mmol/L (ref 22–32)
Creatinine, Ser: 1.57 mg/dL — ABNORMAL HIGH (ref 0.61–1.24)
GFR calc non Af Amer: 49 mL/min — ABNORMAL LOW (ref >60)
GFR, EST AFRICAN AMERICAN: 56 mL/min — AB (ref >60)
Glucose, Bld: 81 mg/dL (ref 65–99)
POTASSIUM: 3.7 mmol/L (ref 3.5–5.1)
Sodium: 137 mmol/L (ref 135–145)

## 2016-06-18 LAB — TROPONIN I: Troponin I: <0.03 ng/mL (ref <0.031)

## 2016-06-18 LAB — PROTIME-INR
INR: 2.6 — AB (ref 0.00–1.49)
PROTHROMBIN TIME: 27.5 s — AB (ref 11.6–15.2)

## 2016-06-18 MED ORDER — FUROSEMIDE 10 MG/ML IJ SOLN
20.0000 mg | Freq: Once | INTRAMUSCULAR | Status: AC
  Administered 2016-06-18: 20 mg via INTRAVENOUS

## 2016-06-18 MED ORDER — FUROSEMIDE 20 MG PO TABS: 20.0000 mg | ORAL_TABLET | Freq: Every day | ORAL | Status: DC

## 2016-06-18 MED ORDER — FUROSEMIDE 10 MG/ML IJ SOLN
20.0000 mg | Freq: Once | INTRAMUSCULAR | Status: DC
  Filled 2016-06-18: qty 2

## 2016-06-18 NOTE — Progress Notes (Signed)
Preliminary results by tech - Venous Duplex Lower Ext. Completed. Negative for deep or superficial vein thrombosis in both legs. Craig [name], BS, RDMS, RVT

## 2016-06-18 NOTE — Discharge Instructions (Signed)
Keep your legs elevated when at rest and use compression stockings which will help with your leg swelling. Please return in the morning for an ultrasound of your legs to rule out a new DVT.   Peripheral Edema You have swelling in your legs (peripheral edema). This swelling is due to excess accumulation of salt and water in your body. Edema may be a sign of heart, kidney or liver disease, or a side effect of a medication. It may also be due to problems in the leg veins. Elevating your legs and using special support stockings may be very helpful, if the cause of the swelling is due to poor venous circulation. Avoid long periods of standing, whatever the cause. Treatment of edema depends on identifying the cause. Chips, pretzels, pickles and other salty foods should be avoided. Restricting salt in your diet is almost always needed. Water pills (diuretics) are often used to remove the excess salt and water from your body via urine. These medicines prevent the kidney from reabsorbing sodium. This increases urine flow. Diuretic treatment may also result in lowering of potassium levels in your body. Potassium supplements may be needed if you have to use diuretics daily. Daily weights can help you keep track of your progress in clearing your edema. You should call your caregiver for follow up care as recommended. SEEK IMMEDIATE MEDICAL CARE IF:   You have increased swelling, pain, redness, or heat in your legs.  You develop shortness of breath, especially when lying down.  You develop chest or abdominal pain, weakness, or fainting.  You have a fever.   This information is not intended to replace advice given to you by your health care provider. Make sure you discuss any questions you have with your health care provider.   Document Released: 01/17/2005 Document Revised: 03/03/2012 Document Reviewed: 06/22/2015 Elsevier Interactive Patient Education [employer]2016 [employer]

## 2016-06-18 NOTE — ED Provider Notes (Signed)
TIME SEEN: By signing my name below, I, Levon Hedger, attest that this documentation has been prepared under the direction and in the presence of Sullivan Jacuinde N Jaleen Finch, DO . Electronically Signed: Levon Hedger, Scribe. 06/18/2016. 1:07 AM.  CHIEF COMPLAINT:  Chief Complaint  Patient presents with  . Leg Swelling    HPI:  HPI Comments:  Craig [name] is a 55 y.o. male with PMHx of PE, DVT On Coumadin, DM, and HIV with CD4 of 280 And nondetectable viral load in January 2017, who presents to the Emergency Department complaining of gradually worsening mild bilateral leg swelling for 3-4 days. Per patient, his left leg has greater degree of swelling. Pt states he has experienced these symptoms before and was given lasix for the edema, but is no longer prescribed Lasix. Pt took Lasix two days ago with no relief and he states the pill may be expired. States he has been off Lasix for 2-3 years. Pt states he also experienced sharp chest pain two days ago, but it has since resolved. Denies any shortness of breath. Per pt, he injured right 4th toe three weeks ago; pt was seen for the injury he states he had a normal X-ray at [facility]. No new injury. He currently takes Coumadin for prior left leg DVT and history of PE. He denies fever or cough. No other associated symptoms or complaints at this time.   ROS: See HPI Constitutional: no fever  Eyes: no drainage  ENT: no runny nose   Cardiovascular:  no chest pain  Resp: no SOB  GI: no vomiting GU: no dysuria Integumentary: no rash  Allergy: no hives  Musculoskeletal: leg swelling  Neurological: no slurred speech ROS otherwise negative  PAST MEDICAL HISTORY/PAST SURGICAL HISTORY:  Past Medical History  Diagnosis Date  . 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 Personal history of venous thrombosis and embolism   . Dysplasia of anus   . Esophageal reflux   . Horseshoe tear of retina without detachment 02-13-12  . 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  . Molluscum  contagiosum 09-29-2012  . 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 infarction   . Other convulsions   . Unspecified hereditary and idiopathic peripheral neuropathy   . Secondary iridocyclitis, infectious   . 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 PE (pulmonary embolism) 04/09/2013    Overview:  Jan 2012   . 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 Hernia, inguinal 04/09/2013  . 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 Deep vein thrombosis (HCC) 05/09/2011    Overview:  Jan 2012   . AIDS (HCC) 05/24/2015  . Shortness of breath dyspnea   . Septic arthritis of wrist (HCC)     rt  . HIV (human immunodeficiency virus infection) (HCC)   . Diabetes mellitus, controlled (HCC) 01/25/2016  . Allergic rhinitis 01/25/2016    MEDICATIONS:  Prior to Admission medications   Medication Sig Start Date End Date Taking? Authorizing Provider  albuterol (PROVENTIL HFA;VENTOLIN HFA) 108 (90 BASE) MCG/ACT inhaler Inhale 2 puffs into the lungs every 6 (six) hours as needed for wheezing or shortness of breath.    Historical Provider, MD  beclomethasone (QVAR) 40 MCG/ACT inhaler Inhale 1 puff into the lungs 2 (two) times daily as needed (shortness of breath).     Historical Provider, MD  budesonide (RHINOCORT AQUA) 32 MCG/ACT nasal spray Place 2 sprays into both nostrils daily. 03/16/15   Charlestine Night, PA-C  cetirizine (ZYRTEC ALLERGY) 10 MG tablet Take 1 tablet (10 mg total) by mouth  daily. 11/22/15   Danelle BerryLeisa Tapia, PA-C  citalopram (CELEXA) 40 MG tablet Take 40 mg by mouth daily.    Historical Provider, MD  clobetasol ointment (TEMOVATE) 0.05 % Apply 1 application topically 2 (two) times daily.  10/04/14   Historical Provider, MD  emtricitabine-tenofovir AF (DESCOVY) 200-25 MG tablet Take 1 tablet by mouth daily. 03/10/16   Randall Hissornelius N Van Dam, MD  fluticasone [facility](FLONASE) 50 MCG/ACT nasal spray USE 1 SPRAY IEN D 11/24/15   Historical Provider, MD  glipiZIDE (GLUCOTROL XL) 2.5 MG  24 hr tablet Take 2.5 mg by mouth. 12/22/15   Historical Provider, MD  hydrOXYzine (ATARAX/VISTARIL) 25 MG tablet Take 25 mg by mouth every 6 (six) hours as needed for anxiety.     Historical Provider, MD  imiquimod (ALDARA) 5 % cream Apply topically 3 (three) times a week.    Historical Provider, MD  metFORMIN (GLUCOPHAGE) 500 MG tablet Take 1 tablet (500 mg total) by mouth 2 (two) times daily with a meal. 12/22/15   Garlon HatchetLisa M Sanders, PA-C  methotrexate (RHEUMATREX) 2.5 MG tablet TK 4 TS Q WEEK 11/24/15   Historical Provider, MD  TIVICAY 50 MG tablet TAKE 1 TABLET BY MOUTH DAILY 03/26/16   Randall Hissornelius N Van Dam, MD  triamcinolone ointment (KENALOG) 0.1 % Apply 1 application topically 2 (two) times daily as needed (for itching). On Face 05/12/15   Historical Provider, MD  warfarin (COUMADIN) 5 MG tablet Take 5-7.5 mg by mouth daily. M and Th take 1 1/2 tab (7.5mg ) all others days take 1 tab    Historical Provider, MD    ALLERGIES:  Allergies  Allergen Reactions  . Shellfish Allergy Shortness Of Breath  . Penicillins Hives    Has patient had a PCN reaction causing immediate rash, facial/tongue/throat swelling, SOB or lightheadedness with hypotension:YES Has patient had a PCN reaction causing Has patient had a PCN reaction that required hospitalization NO Has patient had a PCN reaction occurring within the last 10 years: NO If all of the above answers are "NO", then may proceed with Cephalosporin use.  . Latex Rash    SOCIAL HISTORY:  Social History  Substance Use Topics  . Smoking status: Never Smoker   . Smokeless tobacco: Never Used  . Alcohol Use: No    FAMILY HISTORY: No family history on file.  EXAM: BP 150/106 mmHg  Pulse 102  Temp(Src) 98.6 F (37 C)  Resp 18  Wt 249 lb 4 oz (113.059 kg)  SpO2 100% CONSTITUTIONAL: Alert and oriented and responds appropriately to questions. Well-appearing; well-nourished, Afebrile and nontoxic HEAD: Normocephalic EYES: Conjunctivae clear,  PERRL ENT: normal nose; no rhinorrhea; moist mucous membranes, no JVD NECK: Supple, no meningismus, no LAD  CARD: RRR; S1 and S2 appreciated; no murmurs, no clicks, no rubs, no gallops RESP: Normal chest excursion without splinting or tachypnea; breath sounds clear and equal bilaterally; no wheezes, no rhonchi, no rales, no hypoxia or respiratory distress, speaking full sentences ABD/GI: Normal bowel sounds; non-distended; soft, non-tender, no rebound, no guarding, no peritoneal signs BACK:  The back appears normal and is non-tender to palpation, there is no CVA tenderness EXT: Normal ROM in all joints; non-tender to palpation; edema; normal capillary refill; no cyanosis,  +1 pitting edema in bilateral lower extremities to mid shin, left greater than right calf swelling without tenderness, 2+ DP pulses bilaterally  SKIN: Normal color for age and race; warm; no rash NEURO: Moves all extremities equally, sensation to light touch intact diffusely, cranial nerves  II through XII intact PSYCH: The patient's mood and manner are appropriate. Grooming and personal hygiene are appropriate.  MEDICAL DECISION MAKING: Patient here with mild bilateral lower extremity pitting edema on exam but does have left greater than right swelling. Does have history of DVT. Denies current chest pain or shortness of breath. Doubt PE. His INR is therapeutic. Does have history of HIV but on last check was immunocompetent. Is on antiretroviral therapy. No signs of cellulitis on exam. Complains of pain over the left fourth toe but no bony tenderness, sign of gout, cellulitis. Reports history of injury with negative x-ray 3 weeks ago. No new injury. Patient does not appear to be significant volume overloaded. We'll give him a dose of IM Lasix in the emergency department for symptomatically relief. He does have mild elevation of his creatinine which appears to be something that is intermittent for him. Discussed with him that chronic  Lasix could make this worse. He states he thinks this is why they took him off of Lasix in the past. Discussed with him I recommend keeping his legs elevated, using compression stockings will discharge with 20 mg of Lasix to take once a day for the next 3 days. He does have a PCP for follow-up. We'll have him return in the morning for venous Dopplers of bilateral lower extremes to rule out new DVT. Again no sign of infection on exam. Neurovascular intact distally. I feel he is safe to be discharged home. He is comfortable with this plan.    At this time, I do not feel there is any life-threatening condition present. I have reviewed and discussed all results (EKG, imaging, lab, urine as appropriate), exam findings with patient. I have reviewed nursing notes and appropriate previous records.  I feel the patient is safe to be discharged home without further emergent workup. Discussed usual and customary return precautions. Patient and family (if present) verbalize understanding and are comfortable with this plan.  Patient will follow-up with their primary care provider. If they do not have a primary care provider, information for follow-up has been provided to them. All questions have been answered.    EKG Interpretation  Date/Time:  Sunday June 17 2016 23:11:55 EDT Ventricular Rate:  96 PR Interval:  162 QRS Duration: 96 QT Interval:  360 QTC Calculation: 454 R Axis:   -25 Text Interpretation:  Normal sinus rhythm Normal ECG No significant change since last tracing since 2016 Confirmed by Joniyah Mallinger,  DO, Jamaira Sherk [phone]) on 06/17/2016 11:17:42 PM        Layla MawKristen N Jasmen Emrich, DO 06/18/16 0211

## 2016-09-27 ENCOUNTER — Other Ambulatory Visit: Payer: Self-pay | Admitting: Infectious Disease

## 2016-10-24 ENCOUNTER — Other Ambulatory Visit: Payer: Self-pay | Admitting: Infectious Disease

## 2016-11-28 ENCOUNTER — Emergency Department (HOSPITAL_COMMUNITY)
Admission: EM | Admit: 2016-11-28 | Discharge: 2016-11-28 | Disposition: A | Discharge status: Home / Self Care | Payer: Medicare Other | Attending: Emergency Medicine | Admitting: Emergency Medicine

## 2016-11-28 ENCOUNTER — Encounter (HOSPITAL_COMMUNITY): Payer: Self-pay | Admitting: Emergency Medicine

## 2016-11-28 DIAGNOSIS — I252 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 infarction: Secondary | ICD-10-CM | POA: Insufficient documentation

## 2016-11-28 DIAGNOSIS — M10062 Idiopathic gout, left knee: Secondary | ICD-10-CM | POA: Diagnosis not present

## 2016-11-28 DIAGNOSIS — Z91040 Latex allergy status: Secondary | ICD-10-CM | POA: Diagnosis not present

## 2016-11-28 DIAGNOSIS — I110 Hypertensive heart disease with heart failure: Secondary | ICD-10-CM | POA: Insufficient documentation

## 2016-11-28 DIAGNOSIS — E119 Type 2 diabetes mellitus without complications: Secondary | ICD-10-CM | POA: Diagnosis not present

## 2016-11-28 DIAGNOSIS — I5032 Chronic diastolic (congestive) heart failure: Secondary | ICD-10-CM | POA: Insufficient documentation

## 2016-11-28 DIAGNOSIS — Z79899 Other long term (current) drug therapy: Secondary | ICD-10-CM | POA: Insufficient documentation

## 2016-11-28 DIAGNOSIS — Z7901 Long term (current) use of anticoagulants: Secondary | ICD-10-CM | POA: Insufficient documentation

## 2016-11-28 DIAGNOSIS — M25562 Pain in left knee: Secondary | ICD-10-CM

## 2016-11-28 DIAGNOSIS — I2510 Atherosclerotic heart disease of native coronary artery without angina pectoris: Secondary | ICD-10-CM | POA: Diagnosis not present

## 2016-11-28 DIAGNOSIS — Z7984 Long term (current) use of oral hypoglycemic drugs: Secondary | ICD-10-CM | POA: Diagnosis not present

## 2016-11-28 DIAGNOSIS — M109 Gout, unspecified: Secondary | ICD-10-CM

## 2016-11-28 LAB — BASIC METABOLIC PANEL
Anion gap: 8 (ref 5–15)
BUN: 7 mg/dL (ref 6–20)
CALCIUM: 9.2 mg/dL (ref 8.9–10.3)
CO2: 26 mmol/L (ref 22–32)
CREATININE: 1.2 mg/dL (ref 0.61–1.24)
Chloride: 104 mmol/L (ref 101–111)
GFR calc non Af Amer: >60 mL/min (ref >60)
Glucose, Bld: 83 mg/dL (ref 65–99)
Potassium: 4.2 mmol/L (ref 3.5–5.1)
SODIUM: 138 mmol/L (ref 135–145)

## 2016-11-28 LAB — PROTIME-INR
INR: 2.4
PROTHROMBIN TIME: 26.6 s — AB (ref 11.4–15.2)

## 2016-11-28 LAB — CBC WITH DIFFERENTIAL/PLATELET
BASOS PCT: 0 %
Basophils Absolute: 0 10*3/uL (ref 0.0–0.1)
EOS ABS: 0.2 10*3/uL (ref 0.0–0.7)
Eosinophils Relative: 2 %
HCT: 42.6 % (ref 39.0–52.0)
HEMOGLOBIN: 14.1 g/dL (ref 13.0–17.0)
Lymphocytes Relative: 17 %
Lymphs Abs: 1.5 10*3/uL (ref 0.7–4.0)
MCH: 30.6 pg (ref 26.0–34.0)
MCHC: 33.1 g/dL (ref 30.0–36.0)
MCV: 92.4 fL (ref 78.0–100.0)
MONOS PCT: 11 %
Monocytes Absolute: 1 10*3/uL (ref 0.1–1.0)
NEUTROS PCT: 70 %
Neutro Abs: 5.9 10*3/uL (ref 1.7–7.7)
Platelets: 236 10*3/uL (ref 150–400)
RBC: 4.61 MIL/uL (ref 4.22–5.81)
RDW: 14.2 % (ref 11.5–15.5)
WBC: 8.5 10*3/uL (ref 4.0–10.5)

## 2016-11-28 LAB — SYNOVIAL CELL COUNT + DIFF, W/ CRYSTALS
EOSINOPHILS-SYNOVIAL: 0 % (ref 0–1)
Lymphocytes-Synovial Fld: 0 % (ref 0–20)
Monocyte-Macrophage-Synovial Fluid: 4 % — ABNORMAL LOW (ref 50–90)
NEUTROPHIL, SYNOVIAL: 96 % — AB (ref 0–25)
Other Cells-SYN: 0
WBC, SYNOVIAL: 5450 /mm3 — AB (ref 0–200)

## 2016-11-28 LAB — C-REACTIVE PROTEIN: CRP: 4 mg/dL — AB (ref <1.0)

## 2016-11-28 MED ORDER — OXYCODONE-ACETAMINOPHEN 5-325 MG PO TABS
1.0000 | ORAL_TABLET | Freq: Once | ORAL | Status: AC
  Administered 2016-11-28: 1 via ORAL

## 2016-11-28 MED ORDER — OXYCODONE-ACETAMINOPHEN 5-325 MG PO TABS
1.0000 | ORAL_TABLET | Freq: Once | ORAL | Status: AC
  Administered 2016-11-28: 1 via ORAL
  Filled 2016-11-28: qty 1

## 2016-11-28 MED ORDER — OXYCODONE-ACETAMINOPHEN 5-325 MG PO TABS: 2.0000 | ORAL_TABLET | ORAL | 0 refills | Status: DC | PRN

## 2016-11-28 MED ORDER — OXYCODONE-ACETAMINOPHEN 5-325 MG PO TABS
ORAL_TABLET | ORAL | Status: AC
  Filled 2016-11-28: qty 1

## 2016-11-28 MED ORDER — LIDOCAINE HCL (PF) 1 % IJ SOLN
10.0000 mL | Freq: Once | INTRAMUSCULAR | Status: AC
  Administered 2016-11-28: 10 mL
  Filled 2016-11-28: qty 10

## 2016-11-28 NOTE — Discharge Instructions (Signed)
You were seen in the ED today with left knee pain and a mild fever. Your labs looked normal. We aspirated your knee fluid and found crystals and evidence of gout.    Take your pain medication as prescribed and continue your other home medications. Follow up with your PCP in the coming week.   Return to the ED immediately if your knee pain suddenly worsens, you get high fever, chills, or increased knee redness.

## 2016-11-28 NOTE — ED Triage Notes (Signed)
Patient with pain in left thigh, saw MD yesterday and had US, which was negative for DVT. Pain continues today and patient states there is increased swelling to left thigh.

## 2016-11-28 NOTE — ED Provider Notes (Signed)
Emergency Department Provider Note   I have reviewed the triage vital signs and the nursing notes.   HISTORY  Chief Complaint Leg Pain   HPI Craig [name] is a 55 y.o. male with PMH HIV, DVT, and DM presents to the ED for evaluation of left thigh pain for the last several days. He reports a fever last week of 101F prior to pain onset and none since. He reports a history of gout. [place] [facility] [place] [facility] [place] and had a DVT ultrasound leg which showed no DVT. He's had similar episodes in the past. Pain is worse with movement and/or walking. Denies hip or ankle pain. No chest pain, SOB, or abdominal pain.   Past Medical History:  Diagnosis Date  . AIDS (HCC) 05/24/2015  . Allergic rhinitis 01/25/2016  . 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 Deep vein thrombosis (HCC) 05/09/2011   Overview:  Jan 2012   . Diabetes mellitus, controlled (HCC) 01/25/2016  . Dysplasia of anus   . Esophageal reflux   . 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 Hernia, inguinal 04/09/2013  . HIV (human immunodeficiency virus infection) (HCC)   . Horseshoe tear of retina without detachment 02-13-12  . 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  . Molluscum contagiosum 09-29-2012  . 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 infarction   . Other convulsions   . PE (pulmonary embolism) 04/09/2013   Overview:  Jan 2012   . Personal history of venous thrombosis and embolism   . Secondary iridocyclitis, infectious   . Septic arthritis of wrist (HCC)    rt  . Shortness of breath dyspnea   . Unspecified hereditary and idiopathic peripheral neuropathy   . 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 Diabetes mellitus, controlled (HCC) 01/25/2016  . Allergic rhinitis 01/25/2016  . Elevated INR   . Septic arthritis (HCC) 07/28/2015  . Arthritis 07/28/2015  . Right wrist  joint infection (HCC) 07/28/2015  . HIV disease (HCC) 05/24/2015  . AIDS (HCC) 05/24/2015  . Hypotension 11/22/2014  . Iatrogenic hypotension 11/22/2014  . HIV infection (HCC) 03/08/2014  . History of pulmonary embolus (PE) 03/08/2014  . History of DVT (deep vein thrombosis) 03/08/2014  . Asymptomatic human immunodeficiency virus infection (HCC) 03/08/2014  . H/O deep venous thrombosis 03/08/2014  . 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 H/O thrombosis 03/08/2014  . Cloyce Blankenhorn term current use of anticoagulant therapy 02/26/2014  . Coronary atherosclerosis of unspecified type of vessel, native or graft 02/26/2014  . Chronic eczema 02/26/2014  . Depressive disorder, not elsewhere classified 02/26/2014  . Asthma, chronic 02/26/2014  . Atherosclerosis of coronary artery 02/26/2014  . Clinical depression 02/26/2014  . Chronic diastolic heart failure (HCC) 04/09/2013  . Chronic cardiopulmonary disease (HCC) 04/09/2013  . Acid reflux 04/09/2013  . Essential (primary) hypertension 04/09/2013  . Gout 04/09/2013  . 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 Chronic pulmonary heart disease (HCC) 04/09/2013  . Congestive heart failure (HCC) 04/09/2013  . Seizure (HCC) 04/09/2013  . Diastolic heart failure (HCC) 04/09/2013  . History of anticoagulant therapy 04/09/2013  . Pulmonary embolism (HCC) 04/09/2013  . 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 U shaped retinal tear 02/13/2012  . Deep vein thrombosis (DVT) (HCC) 05/09/2011  . Airway hyperreactivity 09/08/2010  . Human immunodeficiency virus (HIV) infection (HCC) 12/14/2009    Past Surgical History:  Procedure Laterality Date  . COLONOSCOPY    .  I&D EXTREMITY Right 07/28/2015   Procedure: IRRIGATION AND DEBRIDEMENT WRIST;  Surgeon: Knute Neu, MD;  Location: [facility] OR;  Service: Plastics;  Laterality: Right;    Current Outpatient Rx  . Order #: [id number] Class: Historical Med  . Order #: [id number] Class: Historical Med  . Order #:  [id number] Class: Historical Med  . Order #: [id number] Class: Historical Med  . Order #: [id number] Class: Print  . Order #: [id number] Class: Print  . Order #: [id number] Class: Historical Med  . Order #: [id number] Class: Historical Med  . Order #: [id number] Class: Normal  . Order #: [id number] Class: Historical Med  . Order #: [id number] Class: Historical Med  . Order #: [id number] Class: Historical Med  . Order #: [id number] Class: Historical Med  . Order #: [id number] Class: Historical Med  . Order #: [id number] Class: Historical Med  . Order #: [id number] Class: Print  . Order #: [id number] Class: Historical Med  . Order #: [id number] Class: Normal  . Order #: [id number] Class: Historical Med  . Order #: [id number] Class: Historical Med  . Order #: [id number] Class: Print    Allergies Shellfish allergy; Penicillins; and Latex  No family history on file.  Social History Social History  Substance Use Topics  . Smoking status: Never Smoker  . Smokeless tobacco: Never Used  . Alcohol use No    Review of Systems  Constitutional: No fever/chills Eyes: No visual changes. ENT: No sore throat. Cardiovascular: Denies chest pain. Respiratory: Denies shortness of breath. Gastrointestinal: No abdominal pain.  No nausea, no vomiting.  No diarrhea.  No constipation. Genitourinary: Negative for dysuria. Musculoskeletal: Negative for back pain. Positive left hip/knee pain.  Skin: Negative for rash. Neurological: Negative for headaches, focal weakness or numbness.  10-point ROS otherwise negative.  ____________________________________________   PHYSICAL EXAM:  VITAL SIGNS: ED Triage Vitals  Enc Vitals Group     BP 11/28/16 1430 137/86     Pulse Rate 11/28/16 1430 118     Resp 11/28/16 1623 18     Temp 11/28/16 1430 99.2 F (37.3 C)     Temp Source 11/28/16 1430 Oral     SpO2 11/28/16 1430 99 %     Weight 11/28/16 1431 235 lb (106.6 kg)     Height 11/28/16 1431 6' (1.829 m)     Pain Score 11/28/16  1432 10   Constitutional: Alert and oriented. Well appearing and in no acute distress. Eyes: Conjunctivae are normal.  Head: Atraumatic. Nose: No congestion/rhinnorhea. Mouth/Throat: Mucous membranes are moist.  Oropharynx non-erythematous. Neck: No stridor.  Cardiovascular: Normal rate, regular rhythm. Good peripheral circulation. Grossly normal heart sounds.   Respiratory: Normal respiratory effort.  No retractions. Lungs CTAB. Gastrointestinal: Soft and nontender. No distention.  Musculoskeletal: Tenderness to palpation of the left knee with limited ROM. No gross deformities of extremities. Neurologic:  Normal speech and language. No gross focal neurologic deficits are appreciated.  Skin:  Skin is warm, dry and intact. Left knee redness superiorly. Warm to touch.   ____________________________________________   LABS (all labs ordered are listed, but only abnormal results are displayed)  Labs Reviewed  C-REACTIVE PROTEIN - Abnormal; Notable for the following:       Result Value   CRP 4.0 (*)    All other components within normal limits  PROTIME-INR - Abnormal; Notable for the following:    Prothrombin Time 26.6 (*)    All other components within normal limits  SYNOVIAL CELL COUNT + DIFF, W/ CRYSTALS - Abnormal; Notable for the following:    Color, Synovial ORANGE (*)    Appearance-Synovial HAZY (*)  WBC, Synovial 5,450 (*)    Neutrophil, Synovial 96 (*)    Monocyte-Macrophage-Synovial Fluid 4 (*)    All other components within normal limits  BODY FLUID CULTURE  BASIC METABOLIC PANEL  CBC WITH DIFFERENTIAL/PLATELET  GLUCOSE, SYNOVIAL FLUID   ____________________________________________   PROCEDURES  Procedure(s) performed:   .Joint Aspiration/Arthrocentesis Date/Time: 11/28/2016 8:22 PM Performed by: Maia PlanLONG, Izabella Marcantel G Authorized by: Maia PlanLONG, Taquita Demby G   Consent:    Consent obtained:  Verbal   Consent given by:  Patient   Risks discussed:  Bleeding, infection and  pain   Alternatives discussed:  No treatment Location:    Location:  Knee   Knee:  L knee Anesthesia (see MAR for exact dosages):    Anesthesia method:  Local infiltration   Local anesthetic:  Lidocaine 1% w/o epi Procedure details:    Preparation: Patient was prepped and draped in usual sterile fashion     Needle gauge:  18 G   Ultrasound guidance: yes     Approach:  Lateral   Aspirate amount:  20 ml   Aspirate characteristics:  Blood-tinged   Steroid injected: no     Specimen collected: yes   Post-procedure details:    Dressing:  Sterile dressing   Patient tolerance of procedure:  Tolerated well, no immediate complications   ULTRASOUND LIMITED SOFT TISSUE/ MUSCULOSKELETAL:  Indication: left knee pain and swelling Linear probe used to evaluate area of interest in two planes. Findings: Mild superior left knee fluid collection Performed by: Dr Jacqulyn BathLong Images saved electronically  ____________________________________________   INITIAL IMPRESSION / ASSESSMENT AND PLAN / ED COURSE  Pertinent labs & imaging results that were available during my care of the patient were reviewed by me and considered in my medical decision making (see chart for details).  Patient resents emergency department for evaluation of left thigh and knee pain over the past several days. He had a negative DVT ultrasound that is available for me to review and care everywhere. The patient has a borderline fever here in the emergency department. His left knee is warm to touch.   08:15 PM See procedure note above. Joint aspirate sent to lab for evaluation.   10:32 PM Patient with evidence of gout on joint fluid. WBCs less than 6,000. Sending for culture. Patient on coumadin and cannot take NSAIDs. Will defer steroids with HIV disease and elevated temp here in the ED. Patient reports in the past he has received Percocet. He is following with his PCP in the coming week. Discussed return precautions in detail  including chance of developing septic arthritis.   At this time, I do not feel there is any life-threatening condition present. I have reviewed and discussed all results (EKG, imaging, lab, urine as appropriate), exam findings with patient. I have reviewed nursing notes and appropriate previous records.  I feel the patient is safe to be discharged home without further emergent workup. Discussed usual and customary return precautions. Patient and family (if present) verbalize understanding and are comfortable with this plan.  Patient will follow-up with their primary care provider. If they do not have a primary care provider, information for follow-up has been provided to them. All questions have been answered.  ____________________________________________  FINAL CLINICAL IMPRESSION(S) / ED DIAGNOSES  Final diagnoses:  Acute pain of left knee  Acute gout of left knee, unspecified cause     MEDICATIONS GIVEN DURING THIS VISIT:  Medications  lidocaine (PF) (XYLOCAINE) 1 % injection 10 mL (not administered)  oxyCODONE-acetaminophen (PERCOCET/ROXICET)  5-325 MG per tablet 1 tablet (1 tablet Oral Given 11/28/16 1441)  oxyCODONE-acetaminophen (PERCOCET/ROXICET) 5-325 MG per tablet 1 tablet (1 tablet Oral Given 11/28/16 2103)     NEW OUTPATIENT MEDICATIONS STARTED DURING THIS VISIT:  New Prescriptions   OXYCODONE-ACETAMINOPHEN (PERCOCET/ROXICET) 5-325 MG TABLET    Take 2 tablets by mouth every 4 (four) hours as needed for severe pain.     Note:  This document was prepared using Dragon voice recognition software and may include unintentional dictation errors.  Alona Bene, MD Emergency Medicine   Maia Plan, MD 11/28/16 [phone]

## 2016-11-28 NOTE — ED Notes (Signed)
Pt refused percocet since it irritated his stomach earlier.

## 2016-11-28 NOTE — ED Notes (Signed)
Pt c/o pain to left thigh.,  Strong pedal pulse present,.

## 2016-11-28 NOTE — ED Notes (Signed)
Pt given Mal[place]key sandwich with MD permission.

## 2016-11-30 LAB — MISC LABCORP TEST (SEND OUT): Labcorp test code: 19497

## 2016-12-02 LAB — BODY FLUID CULTURE: Culture: NO GROWTH

## 2016-12-10 ENCOUNTER — Other Ambulatory Visit: Payer: Medicare Other

## 2016-12-10 DIAGNOSIS — B20 Human immunodeficiency virus [HIV] disease: Secondary | ICD-10-CM

## 2016-12-10 LAB — CBC WITH DIFFERENTIAL/PLATELET
BASOS PCT: 0 %
Basophils Absolute: 0 cells/uL (ref 0–200)
EOS ABS: 144 {cells}/uL (ref 15–500)
EOS PCT: 3 %
HCT: 40.4 % (ref 38.5–50.0)
Hemoglobin: 13.7 g/dL (ref 13.2–17.1)
LYMPHS PCT: 31 %
Lymphs Abs: 1488 cells/uL (ref 850–3900)
MCH: 30.1 pg (ref 27.0–33.0)
MCHC: 33.9 g/dL (ref 32.0–36.0)
MCV: 88.8 fL (ref 80.0–100.0)
MONOS PCT: 11 %
MPV: 8.9 fL (ref 7.5–12.5)
Monocytes Absolute: 528 cells/uL (ref 200–950)
Neutro Abs: 2640 cells/uL (ref 1500–7800)
Neutrophils Relative %: 55 %
PLATELETS: 329 10*3/uL (ref 140–400)
RBC: 4.55 MIL/uL (ref 4.20–5.80)
RDW: 14.8 % (ref 11.0–15.0)
WBC: 4.8 10*3/uL (ref 3.8–10.8)

## 2016-12-10 LAB — COMPLETE METABOLIC PANEL WITH GFR
ALT: 34 U/L (ref 9–46)
AST: 32 U/L (ref 10–35)
Albumin: 4.2 g/dL (ref 3.6–5.1)
Alkaline Phosphatase: 65 U/L (ref 40–115)
BUN: 14 mg/dL (ref 7–25)
CHLORIDE: 105 mmol/L (ref 98–110)
CO2: 24 mmol/L (ref 20–31)
CREATININE: 1.34 mg/dL — AB (ref 0.70–1.33)
Calcium: 9.9 mg/dL (ref 8.6–10.3)
GFR, EST AFRICAN AMERICAN: 69 mL/min (ref >=60)
GFR, EST NON AFRICAN AMERICAN: 60 mL/min (ref >=60)
Glucose, Bld: 93 mg/dL (ref 65–99)
Potassium: 4.3 mmol/L (ref 3.5–5.3)
SODIUM: 139 mmol/L (ref 135–146)
Total Bilirubin: 0.4 mg/dL (ref 0.2–1.2)
Total Protein: 8.1 g/dL (ref 6.1–8.1)

## 2016-12-10 LAB — LIPID PANEL
CHOL/HDL RATIO: 2.2 ratio (ref <5.0)
CHOLESTEROL: 83 mg/dL (ref <200)
HDL: 37 mg/dL — ABNORMAL LOW (ref >40)
LDL Cholesterol: 34 mg/dL (ref <100)
TRIGLYCERIDES: 62 mg/dL (ref <150)
VLDL: 12 mg/dL (ref <30)

## 2016-12-11 ENCOUNTER — Telehealth: Payer: Self-pay | Admitting: Infectious Disease

## 2016-12-11 LAB — FLUORESCENT TREPONEMAL AB(FTA)-IGG-BLD: FLUORESCENT TREPONEMAL ABS: REACTIVE — AB

## 2016-12-11 LAB — RPR TITER

## 2016-12-11 LAB — T-HELPER CELL (CD4) - (RCID CLINIC ONLY)
CD4 T CELL HELPER: 37 % (ref 33–55)
CD4 T Cell Abs: 570 /uL (ref 400–2700)

## 2016-12-11 LAB — RPR: RPR Ser Ql: REACTIVE — AB

## 2016-12-11 NOTE — Telephone Encounter (Signed)
Pt needs IM PCN 2.4 MU weekly x 3 shots for RPR up to 1: 4 from NR

## 2016-12-12 LAB — HIV-1 RNA QUANT-NO REFLEX-BLD

## 2016-12-12 LAB — URINE CYTOLOGY ANCILLARY ONLY
CHLAMYDIA, DNA PROBE: NEGATIVE
NEISSERIA GONORRHEA: NEGATIVE

## 2016-12-13 ENCOUNTER — Telehealth: Payer: Self-pay | Admitting: *Deleted

## 2016-12-13 ENCOUNTER — Ambulatory Visit (INDEPENDENT_AMBULATORY_CARE_PROVIDER_SITE_OTHER): Payer: Medicare Other

## 2016-12-13 DIAGNOSIS — A539 Syphilis, unspecified: Secondary | ICD-10-CM

## 2016-12-13 NOTE — Telephone Encounter (Signed)
Patient notified and he will come in today for first injection. Then for two more weeks. Craig [name]

## 2016-12-13 NOTE — Telephone Encounter (Signed)
Called patient and left a voice mail to advise him that he will need to come in on 12/27/16 and 01/03/17 for a nurse visit. Craig [name]

## 2016-12-13 NOTE — Telephone Encounter (Signed)
Perfect

## 2016-12-14 DIAGNOSIS — A539 Syphilis, unspecified: Secondary | ICD-10-CM

## 2016-12-14 MED ORDER — PENICILLIN G BENZATHINE 1200000 UNIT/2ML IM SUSP
1.2000 10*6.[IU] | INTRAMUSCULAR | Status: DC
  Administered 2016-12-14 (×2): 1.2 10*6.[IU] via INTRAMUSCULAR

## 2016-12-14 MED ORDER — PENICILLIN G BENZATHINE 1200000 UNIT/2ML IM SUSP: 1.2000 10*6.[IU] | Freq: Once | INTRAMUSCULAR | Status: DC

## 2016-12-20 ENCOUNTER — Ambulatory Visit: Payer: Medicare Other | Admitting: *Deleted

## 2016-12-20 DIAGNOSIS — A539 Syphilis, unspecified: Secondary | ICD-10-CM

## 2016-12-21 MED ORDER — DOXYCYCLINE HYCLATE 100 MG PO TABS: 100.0000 mg | ORAL_TABLET | Freq: Two times a day (BID) | ORAL | 0 refills | Status: DC

## 2016-12-21 NOTE — Progress Notes (Signed)
Patient reported rash all over his body after getting the first dose of bicillin 2.4 million units for positive syphilis test. RN held the injections, advised the patient I would call him with further instructions.  Patient verbalized understanding at visit.  Per Dr Drue SecondSnider, RN cancelled future bicillin orders, updated allergies, sent prescription for doxycycline 100 mg twice daily for 14 days to local [facility] for pick up. Left message for patient, asking him to call back. There is a flagged interaction between Doxycycline and Warfarin. Per Brimhall NizhoniMinh in pharmacy, no issues between these doses. Andree CossHowell, Avarae Zwart M, RN

## 2016-12-25 NOTE — Progress Notes (Signed)
Pretty much any and all antibiotics can have impact on warfarin but doxy less so. He should proceed with doxy and can have his INR followed by PCP. Would give 28 days of dox

## 2016-12-26 ENCOUNTER — Other Ambulatory Visit: Payer: Self-pay | Admitting: *Deleted

## 2016-12-26 DIAGNOSIS — A539 Syphilis, unspecified: Secondary | ICD-10-CM

## 2016-12-26 MED ORDER — DOXYCYCLINE HYCLATE 100 MG PO TABS: 100.0000 mg | ORAL_TABLET | Freq: Two times a day (BID) | ORAL | 0 refills | Status: DC

## 2016-12-26 NOTE — Progress Notes (Signed)
[facility] so patient is treated for full 28 days.  RN left message with instructions for patient.

## 2016-12-27 ENCOUNTER — Ambulatory Visit: Payer: Medicare Other

## 2016-12-31 ENCOUNTER — Encounter: Payer: Self-pay | Admitting: Infectious Disease

## 2016-12-31 ENCOUNTER — Ambulatory Visit (INDEPENDENT_AMBULATORY_CARE_PROVIDER_SITE_OTHER): Payer: Medicare Other | Admitting: Infectious Disease

## 2016-12-31 VITALS — BP 130/80 | HR 90 | Temp 98.1°F | Wt 250.0 lb

## 2016-12-31 DIAGNOSIS — Z7901 Long term (current) use of anticoagulants: Secondary | ICD-10-CM

## 2016-12-31 DIAGNOSIS — I2699 Other pulmonary embolism without acute cor pulmonale: Secondary | ICD-10-CM

## 2016-12-31 DIAGNOSIS — M109 Gout, unspecified: Secondary | ICD-10-CM | POA: Diagnosis not present

## 2016-12-31 DIAGNOSIS — I10 Essential (primary) hypertension: Secondary | ICD-10-CM | POA: Diagnosis not present

## 2016-12-31 DIAGNOSIS — B20 Human immunodeficiency virus [HIV] disease: Secondary | ICD-10-CM | POA: Diagnosis not present

## 2016-12-31 NOTE — Progress Notes (Signed)
Chief complaint: Recent rash and pruritus after penicillin injection for syphilis Subjective:    Patient ID: Craig [name], male    DOB: August 28, 1961, 56 y.o.   MRN: [id number]  HPI  56 year old African American male with HIV, multiple medical problems including eczema (on MTX), DVT with PE, dCHF, HTN, ? MI, prior AKI previously cared for  at [facility] where he had been receiving his care.  I was able in fact to read my note on him from when I was his HIV provider at [facility] in 2006. He had been on a regimen of videx, Emtriva, and Reyataz 400 mg and was perfectly suppressed then was changed over to Reyataz, Norvir and truvada with apparent perfect virological suppression then change to n PRezista, Norvir and Truvada.   Since I last saw him he has had his ARV regimen changed by Dr. Cardell PeachGay who saw him and changed him to Tivicay and Truvada to avoid Norvir interaction with advair that he was being rx and to simplify his regimen.   He has maintained perfect virological suppression since then and we [facility].  Lab Results  Component Value Date   HIV1RNAQUANT <20 12/10/2016   HIV1RNAQUANT <20 01/12/2016   HIV1RNAQUANT <20 05/10/2015       Lab Results  Component Value Date   CD4TABS 570 12/10/2016   CD4TABS 280 (L) 01/12/2016   CD4TABS 550 05/10/2015      He recently had a syphilis titer that was positive at 1-4 and received an intramuscular injection of penicillin which caused him to have diffuse itching and a rash. He was changed over to doxycycline is finishing a course of therapy with that.  He does endorse having new sexual relations within the last year but is not sexually active at present.  He also was diagnosed with gout and is currently on colchicine he has also on warfarin.  Past Medical History:  Diagnosis Date  . AIDS (HCC) 05/24/2015  . Allergic rhinitis 01/25/2016  . 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 CMV (cytomegalovirus infection) (HCC)  02/13/2012  . 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 Deep vein thrombosis (HCC) 05/09/2011   Overview:  Jan 2012   . Diabetes mellitus, controlled (HCC) 01/25/2016  . Dysplasia of anus   . Esophageal reflux   . 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 Hernia, inguinal 04/09/2013  . HIV (human immunodeficiency virus infection) (HCC)   . Horseshoe tear of retina without detachment 02-13-12  . 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  . Molluscum contagiosum 09-29-2012  . 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 infarction   . Other convulsions   . PE (pulmonary embolism) 04/09/2013   Overview:  Jan 2012   . Personal history of venous thrombosis and embolism   . Secondary iridocyclitis, infectious   . Septic arthritis of wrist (HCC)    rt  . Shortness of breath dyspnea   . Unspecified hereditary and idiopathic peripheral neuropathy   . 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    Past Surgical History:  Procedure Laterality Date  . COLONOSCOPY    . I&D EXTREMITY Right 07/28/2015   Procedure: IRRIGATION AND DEBRIDEMENT WRIST;  Surgeon: Knute NeuHarrill Coley, MD;  Location: [facility] OR;  Service: Plastics;  Laterality: Right;    No family history on file.    Social History   Social History  . Marital status: Single    Spouse name: N/A  . Number of children: N/A  . Years of education: N/A   Social History Main Topics  .  Smoking status: Never Smoker  . Smokeless tobacco: Never Used  . Alcohol use No  . Drug use: No  . Sexual activity: Not Currently    Partners: Male    Birth control/ protection: Condom   Other Topics Concern  . None   Social History Narrative  . None    Allergies  Allergen Reactions  . Penicillins Hives and Rash    Has patient had a PCN reaction causing immediate rash, facial/tongue/throat swelling, SOB or lightheadedness with hypotension:YES Has patient had a PCN reaction causing Has patient had a PCN reaction that required hospitalization NO Has patient had a PCN reaction occurring within the last 10  years: YES If all of the above answers are "NO", then may proceed with Cephalosporin use.  . Shellfish Allergy Shortness Of Breath  . Latex Rash     Current Outpatient Prescriptions:  .  albuterol (PROVENTIL HFA;VENTOLIN HFA) 108 (90 BASE) MCG/ACT inhaler, Inhale 2 puffs into the lungs every 6 (six) hours as needed for wheezing or shortness of breath., Disp: , Rfl:  .  atorvastatin (LIPITOR) 40 MG tablet, Take 40 mg by mouth daily., Disp: , Rfl:  .  azithromycin (ZITHROMAX) 250 MG tablet, Take 250-500 mg by mouth daily. Take 500mg  on the first day and 250mg  for the next 5 days, Disp: , Rfl:  .  beclomethasone (QVAR) 40 MCG/ACT inhaler, Inhale 1 puff into the lungs 2 (two) times daily as needed (shortness of breath). , Disp: , Rfl:  .  budesonide (RHINOCORT AQUA) 32 MCG/ACT nasal spray, Place 2 sprays into both nostrils daily. (Patient taking differently: Place 2 sprays into both nostrils daily as needed for rhinitis. ), Disp: 1 Bottle, Rfl: 0 .  cetirizine (ZYRTEC ALLERGY) 10 MG tablet, Take 1 tablet (10 mg total) by mouth daily., Disp: 30 tablet, Rfl: 1 .  citalopram (CELEXA) 40 MG tablet, Take 40 mg by mouth daily., Disp: , Rfl:  .  clobetasol ointment (TEMOVATE) 0.05 %, Apply 1 application topically 2 (two) times daily as needed (rash). , Disp: , Rfl:  .  doxycycline (VIBRA-TABS) 100 MG tablet, Take 1 tablet (100 mg total) by mouth 2 (two) times daily. To finish treatment per Dr Daiva Eves. Full 28 day treatment., Disp: 28 tablet, Rfl: 0 .  emtricitabine-tenofovir AF (DESCOVY) 200-25 MG tablet, Take 1 tablet by mouth daily., Disp: 30 tablet, Rfl: 11 .  fluticasone (FLONASE) 50 MCG/ACT nasal spray, USE 1 SPRAY EACH NOSTRIL DAILY AS NEEDED FOR ALLERIES, Disp: , Rfl: 0 .  folic acid (FOLVITE) 1 MG tablet, Take 4 mg by mouth See admin instructions. Take everyday except on Friday., Disp: , Rfl:  .  glipiZIDE (GLUCOTROL XL) 2.5 MG 24 hr tablet, Take 2.5 mg by mouth., Disp: , Rfl:  .  hydrOXYzine  (ATARAX/VISTARIL) 25 MG tablet, Take 25 mg by mouth every 6 (six) hours as needed for anxiety. , Disp: , Rfl:  .  imiquimod (ALDARA) 5 % cream, Apply topically 3 (three) times a week., Disp: , Rfl:  .  lisinopril (PRINIVIL,ZESTRIL) 2.5 MG tablet, Take 2.5 mg by mouth daily., Disp: , Rfl:  .  metFORMIN (GLUCOPHAGE) 500 MG tablet, Take 1 tablet (500 mg total) by mouth 2 (two) times daily with a meal., Disp: 60 tablet, Rfl: 1 .  methotrexate (RHEUMATREX) 2.5 MG tablet, Take 8 tablets once a week on Friday., Disp: , Rfl: 1 .  oxyCODONE-acetaminophen (PERCOCET/ROXICET) 5-325 MG tablet, Take 2 tablets by mouth every 4 (four) hours as needed  for severe pain., Disp: 15 tablet, Rfl: 0 .  TIVICAY 50 MG tablet, TAKE 1 TABLET BY MOUTH DAILY, Disp: 30 tablet, Rfl: 5 .  triamcinolone ointment (KENALOG) 0.1 %, Apply 1 application topically 2 (two) times daily as needed (for itching). On Face, Disp: , Rfl: 0 .  warfarin (COUMADIN) 5 MG tablet, Take 5-7.5 mg by mouth daily. Take 1.5 (7.5mg ) daily except Sunday take 1 tab, Disp: , Rfl:      Review of Systems  Constitutional: Negative for activity change, appetite change, chills, diaphoresis, fatigue, fever and unexpected weight change.  HENT: Negative for congestion, rhinorrhea, sinus pressure, sneezing, sore throat and trouble swallowing.   Eyes: Negative for photophobia and visual disturbance.  Respiratory: Negative for cough, chest tightness, shortness of breath, wheezing and stridor.   Cardiovascular: Negative for chest pain, palpitations and leg swelling.  Gastrointestinal: Negative for abdominal distention, abdominal pain, anal bleeding, blood in stool, constipation, diarrhea, nausea and vomiting.  Genitourinary: Negative for difficulty urinating, dysuria, flank pain and hematuria.  Musculoskeletal: Negative for arthralgias, back pain, gait problem and joint swelling.  Skin: Negative for color change, pallor, rash and wound.  Neurological: Negative for  dizziness, tremors, weakness and light-headedness.  Hematological: Negative for adenopathy. Does not bruise/bleed easily.  Psychiatric/Behavioral: Negative for agitation, behavioral problems, confusion, decreased concentration, dysphoric mood and sleep disturbance.       Objective:   Physical Exam  Constitutional: He is oriented to person, place, and time. He appears well-developed and well-nourished. No distress.  HENT:  Head: Normocephalic and atraumatic.  Mouth/Throat: Oropharynx is clear and moist. No oropharyngeal exudate.  Eyes: Conjunctivae and EOM are normal. No scleral icterus.  Neck: Normal range of motion. Neck supple. No JVD present.  Cardiovascular: Normal rate and regular rhythm.   Pulmonary/Chest: Effort normal and breath sounds normal. No respiratory distress.  Abdominal: Soft. He exhibits no distension.  Musculoskeletal: He exhibits edema. He exhibits no tenderness.  Lymphadenopathy:    He has no cervical adenopathy.  Neurological: He is alert and oriented to person, place, and time. He exhibits normal muscle tone. Coordination normal.  Skin: Skin is warm and dry. He is not diaphoretic. No erythema. No pallor.  Psychiatric: He has a normal mood and affect. His behavior is normal. Judgment and thought content normal.          Assessment & Plan:   #1 HIV: continue  Tivicay and  Descovy. RTC in 1 year  #2 Syphilis: Continue with his doxycycline.  #3.diabetes mellitus on metformin due to the interaction with TIVICAY it might be desirable to change him to the new single tablet regimen coming from [place] which avoids this problem.  #4 Gout currently on colchicine  #5 deep venous thrombosis on warfarin.   I spent greater than 25 minutes with the patient including greater than 50% of time in face to face counsel of the patient regarding his HIV has syphilis his diabetes is gout his DVT and in coordination of his care.

## 2017-01-01 ENCOUNTER — Ambulatory Visit: Payer: Medicare Other

## 2017-01-01 DIAGNOSIS — B20 Human immunodeficiency virus [HIV] disease: Secondary | ICD-10-CM

## 2017-01-01 MED ORDER — MENINGOCOCCAL A C Y&W-135 OLIG IM SOLR
0.5000 mL | Freq: Once | INTRAMUSCULAR | Status: AC
  Administered 2017-01-01: 0.5 mL via INTRAMUSCULAR

## 2017-01-01 NOTE — Progress Notes (Signed)
Meningococcal vaccine #1 given.  Laurell Josephsammy K King, RN

## 2017-01-24 ENCOUNTER — Other Ambulatory Visit: Payer: Self-pay | Admitting: *Deleted

## 2017-01-24 DIAGNOSIS — M6208 Separation of muscle (nontraumatic), other site: Secondary | ICD-10-CM | POA: Insufficient documentation

## 2017-01-24 DIAGNOSIS — B20 Human immunodeficiency virus [HIV] disease: Secondary | ICD-10-CM

## 2017-01-24 MED ORDER — EMTRICITABINE-TENOFOVIR AF 200-25 MG PO TABS: 1.0000 | ORAL_TABLET | Freq: Every day | ORAL | 5 refills | Status: DC

## 2017-01-24 MED ORDER — DOLUTEGRAVIR SODIUM 50 MG PO TABS: 50.0000 mg | ORAL_TABLET | Freq: Every day | ORAL | 5 refills | Status: DC

## 2017-02-01 ENCOUNTER — Encounter (HOSPITAL_COMMUNITY): Payer: Self-pay | Admitting: *Deleted

## 2017-02-01 ENCOUNTER — Emergency Department (HOSPITAL_COMMUNITY)
Admission: EM | Admit: 2017-02-01 | Discharge: 2017-02-01 | Disposition: A | Discharge status: Home / Self Care | Payer: Medicare Other | Attending: Emergency Medicine | Admitting: Emergency Medicine

## 2017-02-01 DIAGNOSIS — Z7984 Long term (current) use of oral hypoglycemic drugs: Secondary | ICD-10-CM | POA: Insufficient documentation

## 2017-02-01 DIAGNOSIS — M109 Gout, unspecified: Secondary | ICD-10-CM | POA: Insufficient documentation

## 2017-02-01 DIAGNOSIS — I10 Essential (primary) hypertension: Secondary | ICD-10-CM | POA: Insufficient documentation

## 2017-02-01 DIAGNOSIS — J45909 Unspecified asthma, uncomplicated: Secondary | ICD-10-CM | POA: Insufficient documentation

## 2017-02-01 DIAGNOSIS — Z91040 Latex allergy status: Secondary | ICD-10-CM | POA: Diagnosis not present

## 2017-02-01 DIAGNOSIS — E119 Type 2 diabetes mellitus without complications: Secondary | ICD-10-CM | POA: Diagnosis not present

## 2017-02-01 DIAGNOSIS — M25562 Pain in left knee: Secondary | ICD-10-CM | POA: Diagnosis present

## 2017-02-01 DIAGNOSIS — Z7901 Long term (current) use of anticoagulants: Secondary | ICD-10-CM | POA: Insufficient documentation

## 2017-02-01 MED ORDER — HYDROCODONE-ACETAMINOPHEN 5-325 MG PO TABS: 2.0000 | ORAL_TABLET | ORAL | 0 refills | Status: DC | PRN

## 2017-02-01 MED ORDER — COLCHICINE 0.6 MG PO CAPS: ORAL_CAPSULE | ORAL | 0 refills | Status: DC

## 2017-02-01 NOTE — ED Triage Notes (Signed)
PT states L knee pain and swelling since Tues.  Was seen here recently and had it "drained".

## 2017-02-01 NOTE — ED Provider Notes (Signed)
[facility]-EMERGENCY DEPT Provider Note   CSN: [id number] Arrival date & time: 02/01/17  1023     History   Chief Complaint Chief Complaint  Patient presents with  . Knee Pain    HPI Craig [name] is a 56 y.o. male.  The history is provided by the patient. No language interpreter was used.  Knee Pain   This is a recurrent problem. The problem occurs constantly. The problem has been gradually worsening. The pain is present in the left knee. The pain is at a severity of 6/10. The pain is moderate. Associated symptoms include limited range of motion. He has tried nothing for the symptoms. The treatment provided no relief. There has been no history of extremity trauma. Family history is significant for gout.  Pt reports he is out of his gout medication.  Pt reports had limited relief while taking colchicine.   Past Medical History:  Diagnosis Date  . AIDS (HCC) 05/24/2015  . Allergic rhinitis 01/25/2016  . 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 Deep vein thrombosis (HCC) 05/09/2011   Overview:  Jan 2012   . Diabetes mellitus, controlled (HCC) 01/25/2016  . Dysplasia of anus   . Esophageal reflux   . 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 Hernia, inguinal 04/09/2013  . HIV (human immunodeficiency virus infection) (HCC)   . Horseshoe tear of retina without detachment 02-13-12  . 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  . Molluscum contagiosum 09-29-2012  . 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 infarction   . Other convulsions   . PE (pulmonary embolism) 04/09/2013   Overview:  Jan 2012   . Personal history of venous thrombosis and embolism   . Secondary iridocyclitis, infectious   . Septic arthritis of wrist (HCC)    rt  . Shortness of breath dyspnea   . Unspecified hereditary and idiopathic peripheral neuropathy   . 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 Diabetes  mellitus, controlled (HCC) 01/25/2016  . Allergic rhinitis 01/25/2016  . Elevated INR   . Gout of right knee 07/28/2015  . HIV disease (HCC) 05/24/2015  . AIDS (HCC) 05/24/2015  . Hypotension 11/22/2014  . Iatrogenic hypotension 11/22/2014  . HIV infection (HCC) 03/08/2014  . History of pulmonary embolus (PE) 03/08/2014  . History of DVT (deep vein thrombosis) 03/08/2014  . H/O deep venous thrombosis 03/08/2014  . 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 H/O thrombosis 03/08/2014  . Long term current use of anticoagulant therapy 02/26/2014  . Coronary atherosclerosis of unspecified type of vessel, native or graft 02/26/2014  . Chronic eczema 02/26/2014  . Depressive disorder, not elsewhere classified 02/26/2014  . Asthma, chronic 02/26/2014  . Atherosclerosis of coronary artery 02/26/2014  . Clinical depression 02/26/2014  . Chronic diastolic heart failure (HCC) 04/09/2013  . Chronic cardiopulmonary disease (HCC) 04/09/2013  . Acid reflux 04/09/2013  . Essential (primary) hypertension 04/09/2013  . Gout 04/09/2013  . 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 Chronic pulmonary heart disease (HCC) 04/09/2013  . Congestive heart failure (HCC) 04/09/2013  . Seizure (HCC) 04/09/2013  . Diastolic heart failure (HCC) 04/09/2013  . History of anticoagulant therapy 04/09/2013  . Pulmonary embolism (HCC) 04/09/2013  . U shaped retinal tear 02/13/2012  . Deep vein thrombosis (DVT) (HCC) 05/09/2011  . Airway hyperreactivity 09/08/2010  . Human immunodeficiency virus (HIV) infection (HCC) 12/14/2009    Past Surgical History:  Procedure Laterality Date  . COLONOSCOPY    .  I&D EXTREMITY Right 07/28/2015   Procedure: IRRIGATION AND DEBRIDEMENT WRIST;  Surgeon: Knute Neu, MD;  Location: [facility] OR;  Service: Plastics;  Laterality: Right;       Home Medications    Prior to Admission medications   Medication Sig Start Date End Date Taking? Authorizing Provider  albuterol (PROVENTIL HFA;VENTOLIN  HFA) 108 (90 BASE) MCG/ACT inhaler Inhale 2 puffs into the lungs every 6 (six) hours as needed for wheezing or shortness of breath.    Historical Provider, MD  atorvastatin (LIPITOR) 40 MG tablet Take 40 mg by mouth daily.    Historical Provider, MD  azithromycin (ZITHROMAX) 250 MG tablet Take 250-500 mg by mouth daily. Take 500mg  on the first day and 250mg  for the next 5 days    Historical Provider, MD  beclomethasone (QVAR) 40 MCG/ACT inhaler Inhale 1 puff into the lungs 2 (two) times daily as needed (shortness of breath).     Historical Provider, MD  budesonide (RHINOCORT AQUA) 32 MCG/ACT nasal spray Place 2 sprays into both nostrils daily. Patient taking differently: Place 2 sprays into both nostrils daily as needed for rhinitis.  03/16/15   Charlestine Night, PA-C  cetirizine (ZYRTEC ALLERGY) 10 MG tablet Take 1 tablet (10 mg total) by mouth daily. 11/22/15   Danelle Berry, PA-C  citalopram (CELEXA) 40 MG tablet Take 40 mg by mouth daily.    Historical Provider, MD  clobetasol ointment (TEMOVATE) 0.05 % Apply 1 application topically 2 (two) times daily as needed (rash).  10/04/14   Historical Provider, MD  dolutegravir (TIVICAY) 50 MG tablet Take 1 tablet (50 mg total) by mouth daily. 01/24/17   Randall Hiss, MD  doxycycline (VIBRA-TABS) 100 MG tablet Take 1 tablet (100 mg total) by mouth 2 (two) times daily. To finish treatment per Dr Daiva Eves. Full 28 day treatment. 12/26/16   Randall Hiss, MD  emtricitabine-tenofovir AF (DESCOVY) 200-25 MG tablet Take 1 tablet by mouth daily. 01/24/17   Randall Hiss, MD  fluticasone [facility]) 50 MCG/ACT nasal spray USE 1 SPRAY EACH NOSTRIL DAILY AS NEEDED FOR ALLERIES 11/24/15   Historical Provider, MD  folic acid (FOLVITE) 1 MG tablet Take 4 mg by mouth See admin instructions. Take everyday except on Friday.    Historical Provider, MD  glipiZIDE (GLUCOTROL XL) 2.5 MG 24 hr tablet Take 2.5 mg by mouth. 12/22/15   Historical Provider, MD  hydrOXYzine  (ATARAX/VISTARIL) 25 MG tablet Take 25 mg by mouth every 6 (six) hours as needed for anxiety.     Historical Provider, MD  imiquimod (ALDARA) 5 % cream Apply topically 3 (three) times a week.    Historical Provider, MD  lisinopril (PRINIVIL,ZESTRIL) 2.5 MG tablet Take 2.5 mg by mouth daily.    Historical Provider, MD  metFORMIN (GLUCOPHAGE) 500 MG tablet Take 1 tablet (500 mg total) by mouth 2 (two) times daily with a meal. 12/22/15   Garlon Hatchet, PA-C  methotrexate (RHEUMATREX) 2.5 MG tablet Take 8 tablets once a week on Friday. 11/24/15   Historical Provider, MD  oxyCODONE-acetaminophen (PERCOCET/ROXICET) 5-325 MG tablet Take 2 tablets by mouth every 4 (four) hours as needed for severe pain. 11/28/16   Maia Plan, MD  triamcinolone ointment (KENALOG) 0.1 % Apply 1 application topically 2 (two) times daily as needed (for itching). On Face 05/12/15   Historical Provider, MD  warfarin (COUMADIN) 5 MG tablet Take 5-7.5 mg by mouth daily. Take 1.5 (7.5mg ) daily except Sunday take 1 tab  Historical Provider, MD    Family History No family history on file.  Social History Social History  Substance Use Topics  . Smoking status: Never Smoker  . Smokeless tobacco: Never Used  . Alcohol use No     Allergies   Penicillins; Shellfish allergy; and Latex   Review of Systems Review of Systems  Constitutional: Negative for fever.  All other systems reviewed and are negative.    Physical Exam Updated Vital Signs BP 131/86   Pulse 100   Temp 99 F (37.2 C) (Oral)   Resp 16   Ht 6' (1.829 m)   Wt 112.5 kg   SpO2 94%   BMI 33.63 kg/m   Physical Exam  Constitutional: He appears well-developed and well-nourished.  HENT:  Head: Normocephalic.  Musculoskeletal: He exhibits tenderness.  Swollen right knee, warm to touch, does not look septic or cellulitic,   Neurological: He is alert.  Skin: Skin is warm.  Psychiatric: He has a normal mood and affect.     ED Treatments /  Results  Labs (all labs ordered are listed, but only abnormal results are displayed) Labs Reviewed - No data to display  EKG  EKG Interpretation None      Pt is afebrile.  I think this is recurrent gout Radiology No results found.  Procedures Procedures (including critical care time)  Medications Ordered in ED Medications - No data to display   Initial Impression / Assessment and Plan / ED Course  I have reviewed the triage vital signs and the nursing notes.  Pertinent labs & imaging results that were available during my care of the patient were reviewed by me and considered in my medical decision making (see chart for details).       Final Clinical Impressions(s) / ED Diagnoses   Final diagnoses:  Acute gout of left knee, unspecified cause    New Prescriptions New Prescriptions   COLCHICINE 0.6 MG CAPS    One po bid   HYDROCODONE-ACETAMINOPHEN (NORCO/VICODIN) 5-325 MG TABLET    Take 2 tablets by mouth every 4 (four) hours as needed.  An After Visit Summary was printed and given to the patient.   Lonia Skinner Jessie, PA-C 02/01/17 1354    Margarita Grizzle, MD 02/05/17 1113

## 2017-02-22 ENCOUNTER — Encounter: Payer: Self-pay | Admitting: Infectious Disease

## 2017-05-28 IMAGING — CR DG CHEST 2V
2 series · 2 of 2 positions shown · non-contrast
Comparison: Chest radiograph dated 01/25/2015 no

CLINICAL DATA: 54-year-old male with bilateral lower extremity
swelling and redness.

EXAM:
CHEST  2 VIEW

[chest pa]
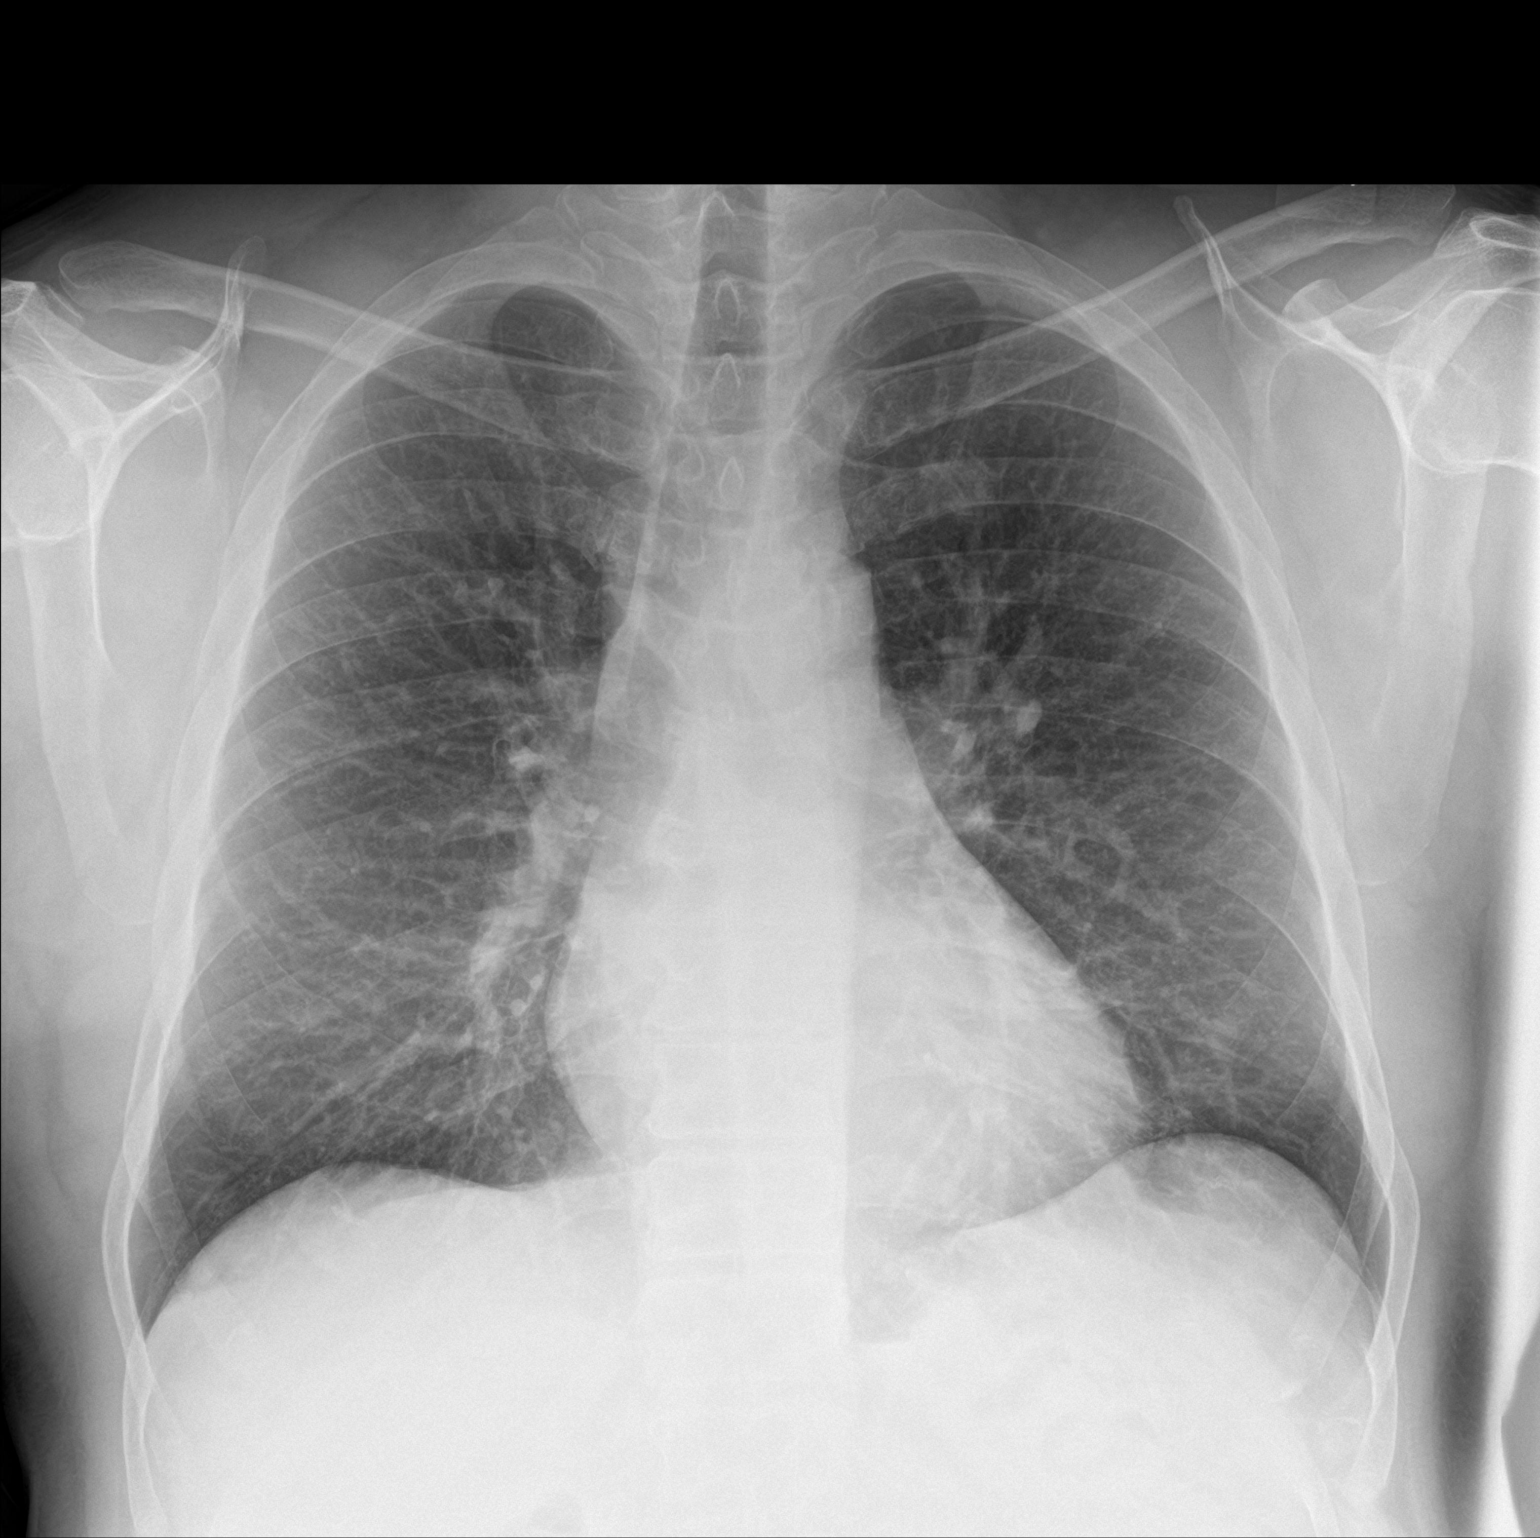

[chest lat]
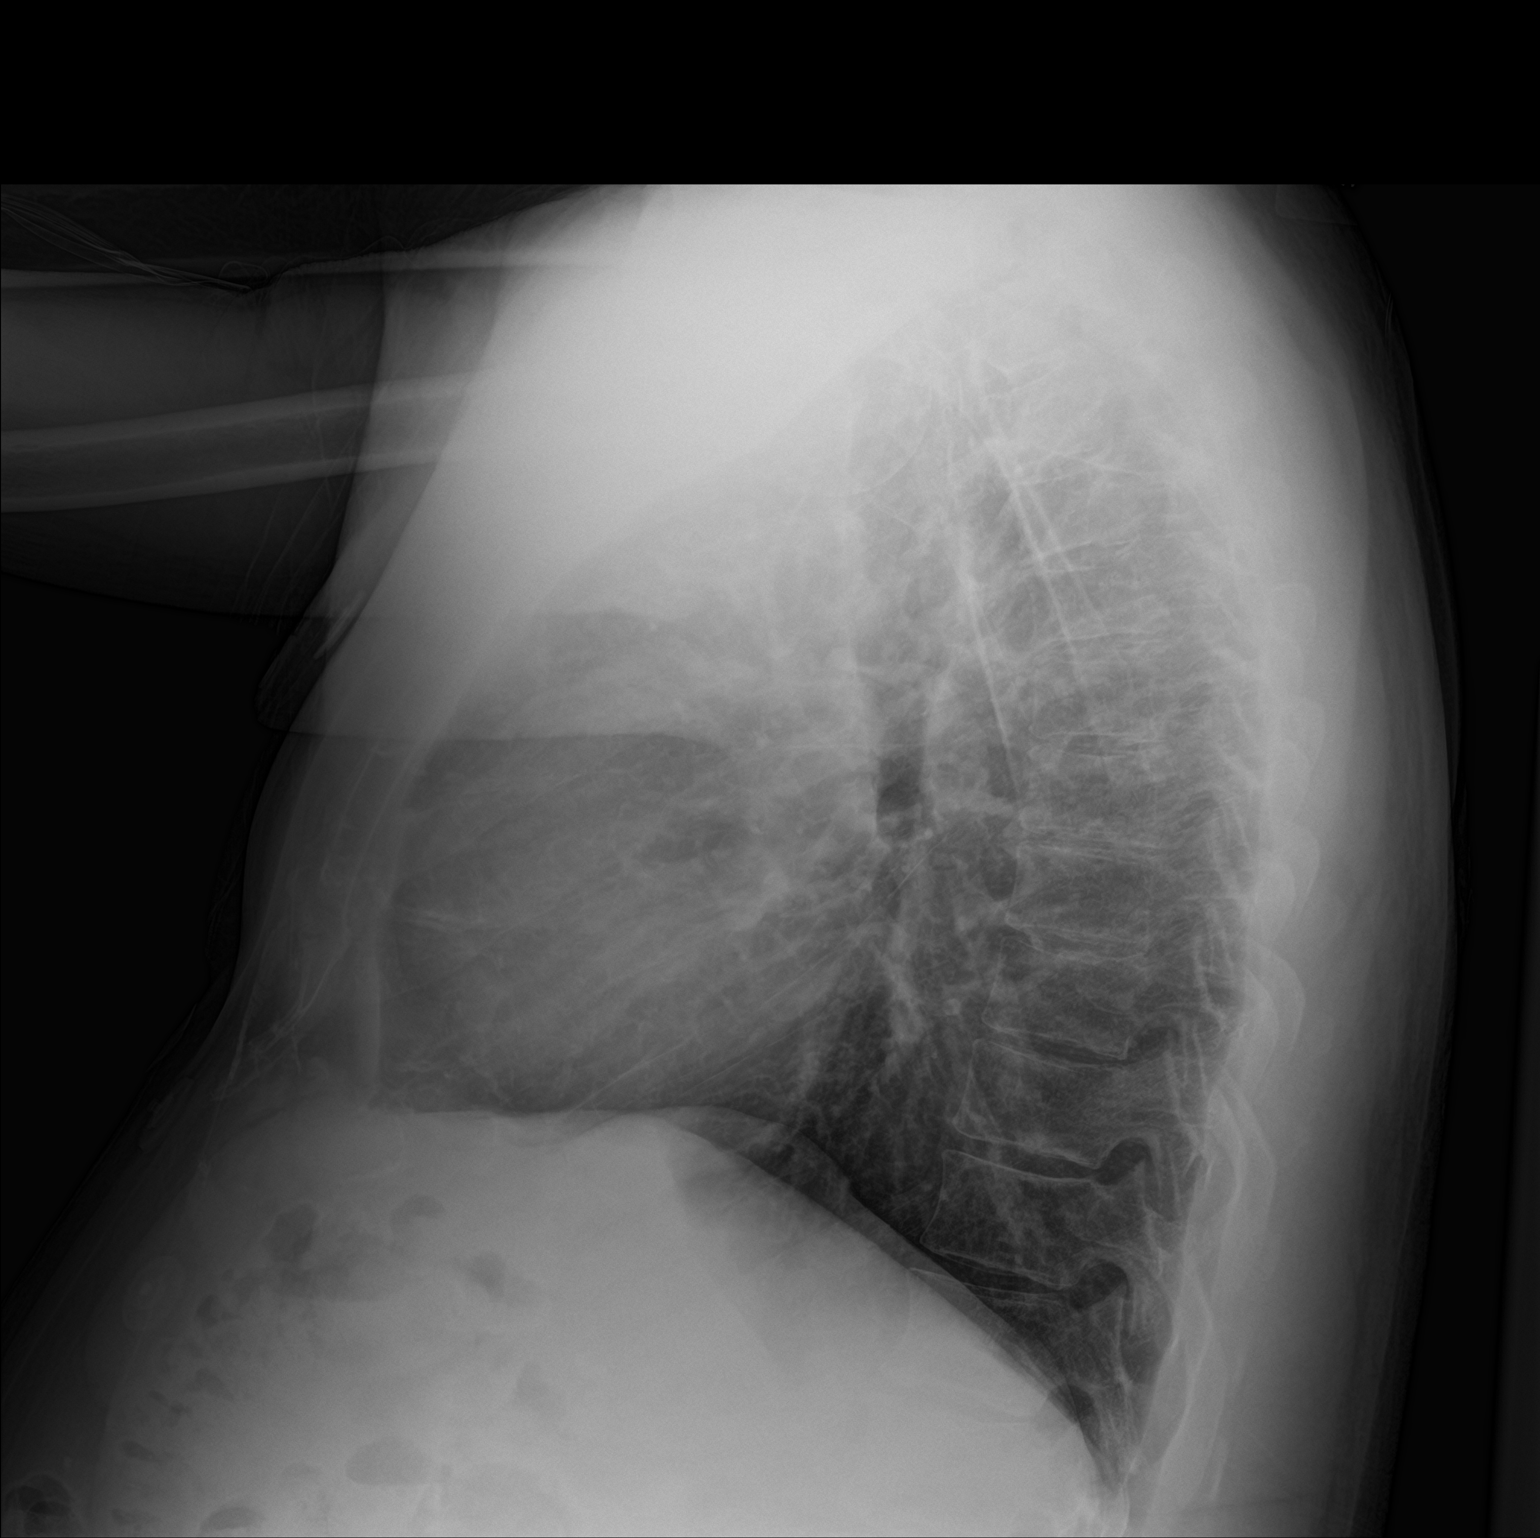

[2 of 2 positions shown; findings below may reference images not displayed]

FINDINGS: Two views of the chest do not demonstrate a focal consolidation.
There is no pleural effusion or pneumothorax. The cardiac silhouette
is within normal limits. No acute osseous pathology.
IMPRESSION: No active cardiopulmonary disease.

## 2017-07-04 ENCOUNTER — Ambulatory Visit
Admission: RE | Admit: 2017-07-04 | Discharge: 2017-07-04 | Disposition: A | Discharge status: Home / Self Care | Payer: MEDICARE

## 2017-07-04 DIAGNOSIS — E119 Type 2 diabetes mellitus without complications: Secondary | ICD-10-CM

## 2017-07-04 DIAGNOSIS — Z86711 Personal history of pulmonary embolism: Secondary | ICD-10-CM

## 2017-07-04 DIAGNOSIS — I8290 Acute embolism and thrombosis of unspecified vein: Principal | ICD-10-CM

## 2017-07-04 MED ORDER — GLIPIZIDE ER 2.5 MG TABLET, EXTENDED RELEASE 24 HR
ORAL_TABLET | 3 refills | 0 days | Status: CP
Start: 2017-07-04 — End: 2018-04-14

## 2017-07-04 MED ORDER — COLCHICINE 0.6 MG TABLET
ORAL_TABLET | 5 refills | 0 days | Status: CP
Start: 2017-07-04 — End: 2018-03-20

## 2017-07-04 MED ORDER — ATORVASTATIN 40 MG TABLET
ORAL_TABLET | Freq: Every day | ORAL | 3 refills | 0.00000 days | Status: CP
Start: 2017-07-04 — End: 2017-07-11

## 2017-07-11 ENCOUNTER — Ambulatory Visit
Admission: RE | Admit: 2017-07-11 | Discharge: 2017-07-11 | Payer: MEDICARE | Attending: Student in an Organized Health Care Education/Training Program | Admitting: Student in an Organized Health Care Education/Training Program

## 2017-07-11 DIAGNOSIS — M109 Gout, unspecified: Secondary | ICD-10-CM

## 2017-07-11 DIAGNOSIS — E118 Type 2 diabetes mellitus with unspecified complications: Secondary | ICD-10-CM

## 2017-07-11 DIAGNOSIS — I2510 Atherosclerotic heart disease of native coronary artery without angina pectoris: Secondary | ICD-10-CM

## 2017-07-11 DIAGNOSIS — L309 Dermatitis, unspecified: Secondary | ICD-10-CM

## 2017-07-11 DIAGNOSIS — K6282 Dysplasia of anus: Secondary | ICD-10-CM

## 2017-07-11 DIAGNOSIS — I5032 Chronic diastolic (congestive) heart failure: Secondary | ICD-10-CM

## 2017-07-11 DIAGNOSIS — J45909 Unspecified asthma, uncomplicated: Secondary | ICD-10-CM

## 2017-07-11 DIAGNOSIS — A5149 Other secondary syphilitic conditions: Secondary | ICD-10-CM

## 2017-07-11 DIAGNOSIS — I10 Essential (primary) hypertension: Secondary | ICD-10-CM

## 2017-07-11 DIAGNOSIS — I2699 Other pulmonary embolism without acute cor pulmonale: Principal | ICD-10-CM

## 2017-07-11 DIAGNOSIS — B20 Human immunodeficiency virus [HIV] disease: Secondary | ICD-10-CM

## 2017-07-11 DIAGNOSIS — I252 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 infarction: Secondary | ICD-10-CM

## 2017-07-11 MED ORDER — ATORVASTATIN 40 MG TABLET
ORAL_TABLET | Freq: Every day | ORAL | 3 refills | 0.00000 days | Status: CP
Start: 2017-07-11 — End: 2017-10-30

## 2017-07-11 MED ORDER — METFORMIN ER 500 MG TABLET,EXTENDED RELEASE 24 HR
ORAL_TABLET | Freq: Every day | ORAL | 3 refills | 0 days | Status: CP
Start: 2017-07-11 — End: 2018-01-14

## 2017-07-12 ENCOUNTER — Other Ambulatory Visit: Payer: Self-pay | Admitting: Infectious Disease

## 2017-07-12 DIAGNOSIS — B20 Human immunodeficiency virus [HIV] disease: Secondary | ICD-10-CM

## 2017-08-03 ENCOUNTER — Emergency Department
Admission: EM | Admit: 2017-08-03 | Discharge: 2017-08-03 | Disposition: A | Discharge status: Home / Self Care | Source: Intra-hospital

## 2017-08-03 MED ORDER — VALACYCLOVIR 1 GRAM TABLET
ORAL_TABLET | Freq: Three times a day (TID) | ORAL | 0 refills | 0 days | Status: CP
Start: 2017-08-03 — End: 2017-08-10

## 2017-08-05 ENCOUNTER — Ambulatory Visit
Admission: RE | Admit: 2017-08-05 | Discharge: 2017-08-05 | Disposition: A | Discharge status: Home / Self Care | Payer: MEDICARE | Admitting: Physician Assistant

## 2017-08-05 DIAGNOSIS — B029 Zoster without complications: Secondary | ICD-10-CM

## 2017-08-05 DIAGNOSIS — I8290 Acute embolism and thrombosis of unspecified vein: Principal | ICD-10-CM

## 2017-08-05 DIAGNOSIS — I824Y2 Acute embolism and thrombosis of unspecified deep veins of left proximal lower extremity: Secondary | ICD-10-CM

## 2017-08-05 MED ORDER — OXYCODONE-ACETAMINOPHEN 5 MG-325 MG TABLET
ORAL_TABLET | ORAL | 0 refills | 0.00000 days | Status: CP | PRN
Start: 2017-08-05 — End: 2018-06-17

## 2017-08-22 ENCOUNTER — Ambulatory Visit
Admission: RE | Admit: 2017-08-22 | Discharge: 2017-08-22 | Payer: MEDICARE | Attending: Pharmacist Clinician (PhC)/ Clinical Pharmacy Specialist | Admitting: Pharmacist Clinician (PhC)/ Clinical Pharmacy Specialist

## 2017-08-22 DIAGNOSIS — E669 Obesity, unspecified: Secondary | ICD-10-CM | POA: Insufficient documentation

## 2017-08-22 DIAGNOSIS — Z0000 Encounter for general adult medical examination without abnormal findings: Principal | ICD-10-CM

## 2017-08-28 ENCOUNTER — Ambulatory Visit
Admission: RE | Admit: 2017-08-28 | Discharge: 2017-08-28 | Disposition: A | Discharge status: Home / Self Care | Payer: MEDICARE

## 2017-08-28 DIAGNOSIS — L309 Dermatitis, unspecified: Principal | ICD-10-CM

## 2017-08-28 DIAGNOSIS — B350 Tinea barbae and tinea capitis: Secondary | ICD-10-CM

## 2017-08-28 MED ORDER — TERBINAFINE HCL 250 MG TABLET: tablet | 1 refills | 0 days | Status: AC

## 2017-08-28 MED ORDER — GRISEOFULVIN MICROSIZE 500 MG TABLET
ORAL_TABLET | ORAL | 1 refills | 0.00000 days | Status: CP
Start: 2017-08-28 — End: 2017-08-28

## 2017-08-28 MED ORDER — TERBINAFINE HCL 250 MG TABLET
ORAL_TABLET | Freq: Every day | ORAL | 1 refills | 0.00000 days | Status: CP
Start: 2017-08-28 — End: 2017-11-13

## 2017-08-29 ENCOUNTER — Encounter: Payer: Self-pay | Admitting: Podiatry

## 2017-08-29 ENCOUNTER — Ambulatory Visit (INDEPENDENT_AMBULATORY_CARE_PROVIDER_SITE_OTHER): Payer: Medicare Other | Admitting: Podiatry

## 2017-08-29 VITALS — BP 147/105 | HR 92 | Temp 98.0°F | Resp 18

## 2017-08-29 DIAGNOSIS — B353 Tinea pedis: Secondary | ICD-10-CM | POA: Diagnosis not present

## 2017-08-29 NOTE — Progress Notes (Signed)
He presents today with a chief complaint of itching and sores between his toes for 1 year. He states he has had no treatment and he is currently taking terbinafine for fungus in his scalp area he states that he is HIV positive.  Objective: Vital signs are stable he is alert and oriented 3 pulses are palpable. Neurologic sensorium is intact per Semmes-Weinstein monofilament. Deep tendon reflexes are intact muscle strength is normal symmetrical bilateral. Orthopedic evaluation demonstrates all joints distal to the ankle for range of motion without crepitation. 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  Assessment: Tinea pedis.  Plan: I recommended he continue the terbinafine and follow-up with me if he is unimproved in 1 month.

## 2017-08-29 NOTE — Progress Notes (Signed)
   Subjective:    Patient ID: Craig [name], male    DOB: 21-Nov-1961, 56 y.o.   MRN: [id number]  HPI    Review of Systems  Constitutional: Positive for chills.  Musculoskeletal: Positive for arthralgias, back pain, gait problem and myalgias.  Skin: Positive for rash.  Hematological: Bruises/bleeds easily.  All other systems reviewed and are negative.      Objective:   Physical Exam        Assessment & Plan:

## 2017-08-30 MED ORDER — DOXYCYCLINE MONOHYDRATE 100 MG TABLET
ORAL_TABLET | Freq: Two times a day (BID) | ORAL | 2 refills | 0.00000 days | Status: CP
Start: 2017-08-30 — End: 2017-08-30

## 2017-08-30 MED ORDER — DOXYCYCLINE MONOHYDRATE 100 MG TABLET: 100 mg | tablet | Freq: Two times a day (BID) | 2 refills | 0 days | Status: AC

## 2017-09-02 ENCOUNTER — Ambulatory Visit
Admission: RE | Admit: 2017-09-02 | Discharge: 2017-09-02 | Disposition: A | Discharge status: Home / Self Care | Payer: MEDICARE | Attending: Registered" | Admitting: Registered"

## 2017-09-02 ENCOUNTER — Ambulatory Visit
Admission: RE | Admit: 2017-09-02 | Discharge: 2017-09-02 | Disposition: A | Discharge status: Home / Self Care | Payer: MEDICARE

## 2017-09-02 DIAGNOSIS — Z6832 Body mass index (BMI) 32.0-32.9, adult: Principal | ICD-10-CM

## 2017-09-02 DIAGNOSIS — I8290 Acute embolism and thrombosis of unspecified vein: Principal | ICD-10-CM

## 2017-09-02 MED ORDER — WARFARIN 5 MG TABLET
ORAL_TABLET | prn refills | 0 days | Status: CP
Start: 2017-09-02 — End: 2017-09-18

## 2017-09-02 MED ORDER — CITALOPRAM 40 MG TABLET
ORAL_TABLET | Freq: Every day | ORAL | 3 refills | 0 days | Status: CP
Start: 2017-09-02 — End: 2018-10-16

## 2017-09-02 MED ORDER — LISINOPRIL 2.5 MG TABLET
ORAL_TABLET | 1 refills | 0 days | Status: CP
Start: 2017-09-02 — End: 2018-07-05

## 2017-09-03 MED ORDER — SULFAMETHOXAZOLE 800 MG-TRIMETHOPRIM 160 MG TABLET
ORAL_TABLET | ORAL | 0 refills | 0.00000 days | Status: CP
Start: 2017-09-03 — End: 2017-11-13

## 2017-09-03 MED ORDER — SULFAMETHOXAZOLE 800 MG-TRIMETHOPRIM 160 MG TABLET: tablet | 0 refills | 0 days | Status: AC

## 2017-09-11 ENCOUNTER — Ambulatory Visit
Admission: RE | Admit: 2017-09-11 | Discharge: 2017-09-11 | Disposition: A | Discharge status: Home / Self Care | Payer: MEDICARE

## 2017-09-11 DIAGNOSIS — L309 Dermatitis, unspecified: Secondary | ICD-10-CM

## 2017-09-11 DIAGNOSIS — I8290 Acute embolism and thrombosis of unspecified vein: Principal | ICD-10-CM

## 2017-09-18 ENCOUNTER — Ambulatory Visit
Admission: RE | Admit: 2017-09-18 | Discharge: 2017-09-18 | Disposition: A | Discharge status: Home / Self Care | Payer: MEDICARE

## 2017-09-18 ENCOUNTER — Ambulatory Visit
Admission: RE | Admit: 2017-09-18 | Discharge: 2017-09-18 | Disposition: A | Discharge status: Home / Self Care | Payer: MEDICARE | Admitting: Physician Assistant

## 2017-09-18 DIAGNOSIS — L309 Dermatitis, unspecified: Secondary | ICD-10-CM

## 2017-09-18 DIAGNOSIS — I8290 Acute embolism and thrombosis of unspecified vein: Principal | ICD-10-CM

## 2017-09-18 DIAGNOSIS — I824Y2 Acute embolism and thrombosis of unspecified deep veins of left proximal lower extremity: Secondary | ICD-10-CM

## 2017-09-18 DIAGNOSIS — B350 Tinea barbae and tinea capitis: Principal | ICD-10-CM

## 2017-09-18 MED ORDER — WARFARIN 5 MG TABLET
ORAL_TABLET | prn refills | 0 days
Start: 2017-09-18 — End: 2017-09-30

## 2017-09-30 ENCOUNTER — Ambulatory Visit
Admission: RE | Admit: 2017-09-30 | Discharge: 2017-09-30 | Disposition: A | Discharge status: Home / Self Care | Payer: MEDICARE | Attending: Registered" | Admitting: Registered"

## 2017-09-30 ENCOUNTER — Ambulatory Visit
Admission: RE | Admit: 2017-09-30 | Discharge: 2017-09-30 | Disposition: A | Discharge status: Home / Self Care | Payer: MEDICARE | Admitting: Physician Assistant

## 2017-09-30 DIAGNOSIS — I8290 Acute embolism and thrombosis of unspecified vein: Principal | ICD-10-CM

## 2017-09-30 DIAGNOSIS — Z6834 Body mass index (BMI) 34.0-34.9, adult: Principal | ICD-10-CM

## 2017-09-30 DIAGNOSIS — Z6832 Body mass index (BMI) 32.0-32.9, adult: Secondary | ICD-10-CM

## 2017-09-30 DIAGNOSIS — L309 Dermatitis, unspecified: Secondary | ICD-10-CM

## 2017-09-30 DIAGNOSIS — I2699 Other pulmonary embolism without acute cor pulmonale: Secondary | ICD-10-CM

## 2017-09-30 MED ORDER — VARICELLA-ZOSTER GLYCOE VACC-AS01B ADJ(PF) 50 MCG/0.5 ML IM SUSP, KIT
Freq: Once | INTRAMUSCULAR | 1 refills | 0 days | Status: CP
Start: 2017-09-30 — End: 2017-09-30

## 2017-09-30 MED ORDER — WARFARIN 5 MG TABLET
ORAL_TABLET | prn refills | 0 days
Start: 2017-09-30 — End: 2017-10-16

## 2017-10-15 LAB — GLUCOSE, POCT (MANUAL RESULT ENTRY): POC Glucose: 111 mg/dl — AB (ref 70–99)

## 2017-10-16 ENCOUNTER — Ambulatory Visit
Admission: RE | Admit: 2017-10-16 | Discharge: 2017-10-16 | Disposition: A | Discharge status: Home / Self Care | Payer: MEDICARE | Admitting: Physician Assistant

## 2017-10-16 ENCOUNTER — Ambulatory Visit
Admission: RE | Admit: 2017-10-16 | Discharge: 2017-10-16 | Disposition: A | Discharge status: Home / Self Care | Payer: MEDICARE

## 2017-10-16 DIAGNOSIS — I8290 Acute embolism and thrombosis of unspecified vein: Principal | ICD-10-CM

## 2017-10-16 DIAGNOSIS — E119 Type 2 diabetes mellitus without complications: Secondary | ICD-10-CM

## 2017-10-16 MED ORDER — WARFARIN 5 MG TABLET
ORAL_TABLET | prn refills | 0 days
Start: 2017-10-16 — End: 2017-12-16

## 2017-10-30 ENCOUNTER — Ambulatory Visit
Admission: RE | Admit: 2017-10-30 | Discharge: 2017-10-30 | Disposition: A | Discharge status: Home / Self Care | Payer: MEDICARE

## 2017-10-30 DIAGNOSIS — I2510 Atherosclerotic heart disease of native coronary artery without angina pectoris: Secondary | ICD-10-CM

## 2017-10-30 DIAGNOSIS — E119 Type 2 diabetes mellitus without complications: Secondary | ICD-10-CM

## 2017-10-30 DIAGNOSIS — I8290 Acute embolism and thrombosis of unspecified vein: Principal | ICD-10-CM

## 2017-10-30 MED ORDER — ATORVASTATIN 40 MG TABLET
ORAL_TABLET | Freq: Every day | ORAL | 3 refills | 0 days | Status: CP
Start: 2017-10-30 — End: 2018-07-28

## 2017-11-13 ENCOUNTER — Ambulatory Visit: Admission: RE | Admit: 2017-11-13 | Discharge: 2017-11-13 | Payer: MEDICARE

## 2017-11-13 DIAGNOSIS — L309 Dermatitis, unspecified: Secondary | ICD-10-CM

## 2017-11-13 DIAGNOSIS — Z79899 Other long term (current) drug therapy: Secondary | ICD-10-CM

## 2017-11-13 DIAGNOSIS — Z5181 Encounter for therapeutic drug level monitoring: Secondary | ICD-10-CM

## 2017-11-13 DIAGNOSIS — L2084 Intrinsic (allergic) eczema: Secondary | ICD-10-CM

## 2017-11-13 DIAGNOSIS — L209 Atopic dermatitis, unspecified: Secondary | ICD-10-CM

## 2017-11-13 DIAGNOSIS — B350 Tinea barbae and tinea capitis: Principal | ICD-10-CM

## 2017-11-13 MED ORDER — METHOTREXATE SODIUM 2.5 MG TABLET
ORAL_TABLET | ORAL | 4 refills | 0.00000 days | Status: CP
Start: 2017-11-13 — End: 2018-01-22

## 2017-11-13 MED ORDER — TERBINAFINE HCL 250 MG TABLET
ORAL_TABLET | ORAL | 1 refills | 0.00000 days | Status: CP
Start: 2017-11-13 — End: 2018-01-22

## 2017-11-13 MED ORDER — BETAMETHASONE DIPROPIONATE 0.05 % TOPICAL OINTMENT
Freq: Two times a day (BID) | TOPICAL | 2 refills | 0.00000 days | Status: CP
Start: 2017-11-13 — End: 2018-11-13

## 2017-11-21 ENCOUNTER — Ambulatory Visit
Admission: RE | Admit: 2017-11-21 | Discharge: 2017-11-21 | Disposition: A | Discharge status: Home / Self Care | Payer: MEDICARE

## 2017-11-21 DIAGNOSIS — Z5181 Encounter for therapeutic drug level monitoring: Principal | ICD-10-CM

## 2017-11-21 DIAGNOSIS — I8290 Acute embolism and thrombosis of unspecified vein: Principal | ICD-10-CM

## 2017-11-21 DIAGNOSIS — I2699 Other pulmonary embolism without acute cor pulmonale: Secondary | ICD-10-CM

## 2017-11-27 ENCOUNTER — Other Ambulatory Visit: Payer: Self-pay | Admitting: Infectious Disease

## 2017-11-27 DIAGNOSIS — B20 Human immunodeficiency virus [HIV] disease: Secondary | ICD-10-CM

## 2017-12-09 ENCOUNTER — Other Ambulatory Visit: Payer: Medicare Other

## 2017-12-09 ENCOUNTER — Other Ambulatory Visit (HOSPITAL_COMMUNITY)
Admission: RE | Admit: 2017-12-09 | Discharge: 2017-12-09 | Disposition: A | Discharge status: Home / Self Care | Payer: Medicare Other | Source: Ambulatory Visit | Attending: Infectious Disease | Admitting: Infectious Disease

## 2017-12-09 ENCOUNTER — Encounter (HOSPITAL_COMMUNITY): Payer: Self-pay | Admitting: Family Medicine

## 2017-12-09 ENCOUNTER — Telehealth: Payer: Self-pay | Admitting: *Deleted

## 2017-12-09 ENCOUNTER — Other Ambulatory Visit: Payer: Self-pay

## 2017-12-09 ENCOUNTER — Ambulatory Visit (HOSPITAL_COMMUNITY)
Admission: EM | Admit: 2017-12-09 | Discharge: 2017-12-09 | Disposition: A | Discharge status: Home / Self Care | Payer: Medicare Other | Attending: Family Medicine | Admitting: Family Medicine

## 2017-12-09 DIAGNOSIS — M109 Gout, unspecified: Secondary | ICD-10-CM | POA: Diagnosis present

## 2017-12-09 DIAGNOSIS — R6889 Other general symptoms and signs: Secondary | ICD-10-CM

## 2017-12-09 DIAGNOSIS — R059 Cough, unspecified: Secondary | ICD-10-CM

## 2017-12-09 DIAGNOSIS — R05 Cough: Secondary | ICD-10-CM

## 2017-12-09 DIAGNOSIS — L309 Dermatitis, unspecified: Secondary | ICD-10-CM

## 2017-12-09 DIAGNOSIS — B20 Human immunodeficiency virus [HIV] disease: Secondary | ICD-10-CM

## 2017-12-09 DIAGNOSIS — M1000 Idiopathic gout, unspecified site: Secondary | ICD-10-CM

## 2017-12-09 DIAGNOSIS — Z86718 Personal history of other venous thrombosis and embolism: Secondary | ICD-10-CM

## 2017-12-09 DIAGNOSIS — Z21 Asymptomatic human immunodeficiency virus [HIV] infection status: Secondary | ICD-10-CM

## 2017-12-09 DIAGNOSIS — E785 Hyperlipidemia, unspecified: Secondary | ICD-10-CM

## 2017-12-09 DIAGNOSIS — Z86711 Personal history of pulmonary embolism: Secondary | ICD-10-CM

## 2017-12-09 DIAGNOSIS — I9589 Other hypotension: Secondary | ICD-10-CM

## 2017-12-09 MED ORDER — HYDROCODONE-HOMATROPINE 5-1.5 MG/5ML PO SYRP: 5.0000 mL | ORAL_SOLUTION | Freq: Four times a day (QID) | ORAL | 0 refills | Status: DC | PRN

## 2017-12-09 NOTE — Telephone Encounter (Signed)
Patient had lab appt today and wanted to see a nurse for possible flu. No available AM appointments and advised patient to see his PCP. He [place] in [place]. He does go to Internal Medicine clinic also. Advised patient to be evaluated at Urgent Care; as he is insured. Patient agreed and I gave him directions to their facility. Wendall MolaJacqueline Cockerham

## 2017-12-09 NOTE — Discharge Instructions (Signed)
Be aware, your cough medication may cause drowsiness. Please do not drive, operate heavy machinery or make important decisions while on this medication, it can cloud your judgement.  

## 2017-12-09 NOTE — ED Triage Notes (Signed)
Per pt he's having sore throat, congestions, aches and pain all over his body, fatigue,

## 2017-12-09 NOTE — ED Provider Notes (Signed)
[facility]   [id number] 12/09/17 Arrival Time: 1051  ASSESSMENT & PLAN:  1. Flu-like symptoms   2. Cough     Meds ordered this encounter  Medications  . HYDROcodone-homatropine (HYCODAN) 5-1.5 MG/5ML syrup    Sig: Take 5 mLs by mouth every 6 (six) hours as needed for cough.    Dispense:  60 mL    Refill:  0   Medication sedation precautions given. Ensure adequate fluid intake. OTC symptom care as needed. May f/u as needed.  Reviewed expectations re: course of current medical issues. Questions answered. Outlined signs and symptoms indicating need for more acute intervention. Patient verbalized understanding. After Visit Summary given.   SUBJECTIVE:  Craig [name] is a 56 y.o. male who presents with complaint of nasal congestion, post-nasal drainage, and a persistent cough. Onset abrupt, approximately 3 days ago. Overall fatigued. SOB: none. Wheezing: none. Fever: questions subjective. Overall normal PO intake without n/v. Sick contacts: no.  Social History   Tobacco Use  Smoking Status Never Smoker  Smokeless Tobacco Never Used   OTC treatment: Robitussin without help. Cough now affecting sleep.  ROS: As per HPI.   OBJECTIVE:  Vitals:   12/09/17 1154  BP: 128/81  Pulse: 94  Temp: 98.3 F (36.8 C)  TempSrc: Oral  SpO2: 99%     General appearance: alert; no distress HEENT: nasal congestion; clear runny nose; throat irritation secondary to post-nasal drainage Neck: supple without LAD Lungs: clear to auscultation bilaterally; active dry cough Skin: warm and dry Psychological: alert and cooperative; normal mood and affect  No results found.  Allergies  Allergen Reactions  . Penicillins Hives and Rash    Has patient had a PCN reaction causing immediate rash, facial/tongue/throat swelling, SOB or lightheadedness with hypotension:YES Has patient had a PCN reaction causing Has patient had a PCN reaction that required hospitalization NO Has  patient had a PCN reaction occurring within the last 10 years: YES If all of the above answers are "NO", then may proceed with Cephalosporin use.  . Shellfish Allergy Shortness Of Breath  . Latex Rash    Past Medical History:  Diagnosis Date  . AIDS (HCC) 05/24/2015  . Allergic rhinitis 01/25/2016  . 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 Deep vein thrombosis (HCC) 05/09/2011   Overview:  Jan 2012   . Diabetes mellitus, controlled (HCC) 01/25/2016  . Dysplasia of anus   . Esophageal reflux   . 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 Hernia, inguinal 04/09/2013  . HIV (human immunodeficiency virus infection) (HCC)   . Horseshoe tear of retina without detachment 02-13-12  . 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  . Molluscum contagiosum 09-29-2012  . 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 infarction   . Other convulsions   . PE (pulmonary embolism) 04/09/2013   Overview:  Jan 2012   . Personal history of venous thrombosis and embolism   . Secondary iridocyclitis, infectious   . Septic arthritis of wrist (HCC)    rt  . Shortness of breath dyspnea   . Unspecified hereditary and idiopathic peripheral neuropathy   . 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 No pertinent family history.  Social History   Socioeconomic History  . Marital status: Single    Spouse name: Not on file  . Number of children: Not on file  . Years of education: Not on file  . Highest education level: Not on file  Social Needs  . Physicist, medicalinancial resource  strain: Not on file  . Food insecurity - worry: Not on file  . Food insecurity - inability: Not on file  . Transportation needs - medical: Not on file  . Transportation needs - non-medical: Not on file  Occupational History  . Not on file  Tobacco Use  . Smoking status: Never Smoker  . Smokeless tobacco: Never Used  Substance and Sexual Activity  . Alcohol use: No     Alcohol/week: 0.0 oz  . Drug use: No  . Sexual activity: Not Currently    Partners: Male    Birth control/protection: Condom  Other Topics Concern  . Not on file  Social History Narrative  . Not on file           Mardella LaymanHagler, Kurt Azimi, MD 12/09/17 1224

## 2017-12-10 LAB — COMPLETE METABOLIC PANEL WITH GFR
AG Ratio: 1 (calc) (ref 1.0–2.5)
ALKALINE PHOSPHATASE (APISO): 64 U/L (ref 40–115)
ALT: 18 U/L (ref 9–46)
AST: 24 U/L (ref 10–35)
Albumin: 3.9 g/dL (ref 3.6–5.1)
BILIRUBIN TOTAL: 0.6 mg/dL (ref 0.2–1.2)
BUN: 10 mg/dL (ref 7–25)
CHLORIDE: 104 mmol/L (ref 98–110)
CO2: 28 mmol/L (ref 20–32)
CREATININE: 1.19 mg/dL (ref 0.70–1.33)
Calcium: 9.2 mg/dL (ref 8.6–10.3)
GFR, Est African American: 79 mL/min/{1.73_m2} (ref >=60)
GFR, Est Non African American: 68 mL/min/{1.73_m2} (ref >=60)
GLOBULIN: 4.1 g/dL — AB (ref 1.9–3.7)
GLUCOSE: 122 mg/dL — AB (ref 65–99)
Potassium: 3.9 mmol/L (ref 3.5–5.3)
SODIUM: 138 mmol/L (ref 135–146)
Total Protein: 8 g/dL (ref 6.1–8.1)

## 2017-12-10 LAB — CBC WITH DIFFERENTIAL/PLATELET
BASOS PCT: 0.4 %
Basophils Absolute: 20 cells/uL (ref 0–200)
EOS PCT: 2 %
Eosinophils Absolute: 100 cells/uL (ref 15–500)
HCT: 37.8 % — ABNORMAL LOW (ref 38.5–50.0)
Hemoglobin: 12.6 g/dL — ABNORMAL LOW (ref 13.2–17.1)
Lymphs Abs: 1105 cells/uL (ref 850–3900)
MCH: 28.4 pg (ref 27.0–33.0)
MCHC: 33.3 g/dL (ref 32.0–36.0)
MCV: 85.3 fL (ref 80.0–100.0)
MPV: 10 fL (ref 7.5–12.5)
Monocytes Relative: 10 %
NEUTROS PCT: 65.5 %
Neutro Abs: 3275 cells/uL (ref 1500–7800)
PLATELETS: 208 10*3/uL (ref 140–400)
RBC: 4.43 10*6/uL (ref 4.20–5.80)
RDW: 14.7 % (ref 11.0–15.0)
TOTAL LYMPHOCYTE: 22.1 %
WBC: 5 10*3/uL (ref 3.8–10.8)
WBCMIX: 500 {cells}/uL (ref 200–950)

## 2017-12-10 LAB — T-HELPER CELL (CD4) - (RCID CLINIC ONLY)
CD4 T CELL ABS: 400 /uL (ref 400–2700)
CD4 T CELL HELPER: 32 % — AB (ref 33–55)

## 2017-12-10 LAB — URINE CYTOLOGY ANCILLARY ONLY
CHLAMYDIA, DNA PROBE: NEGATIVE
Neisseria Gonorrhea: NEGATIVE

## 2017-12-10 LAB — RPR: RPR: NONREACTIVE

## 2017-12-11 LAB — HIV-1 RNA QUANT-NO REFLEX-BLD
HIV 1 RNA QUANT: NOT DETECTED {copies}/mL
HIV-1 RNA Quant, Log: <1.3 Log copies/mL

## 2017-12-16 ENCOUNTER — Ambulatory Visit
Admission: RE | Admit: 2017-12-16 | Discharge: 2017-12-16 | Disposition: A | Discharge status: Home / Self Care | Payer: MEDICARE

## 2017-12-16 DIAGNOSIS — I8290 Acute embolism and thrombosis of unspecified vein: Principal | ICD-10-CM

## 2017-12-16 DIAGNOSIS — L209 Atopic dermatitis, unspecified: Secondary | ICD-10-CM

## 2017-12-16 DIAGNOSIS — J069 Acute upper respiratory infection, unspecified: Secondary | ICD-10-CM

## 2017-12-16 DIAGNOSIS — I2699 Other pulmonary embolism without acute cor pulmonale: Secondary | ICD-10-CM

## 2017-12-16 MED ORDER — WARFARIN 5 MG TABLET
ORAL_TABLET | prn refills | 0 days | Status: CP
Start: 2017-12-16 — End: 2018-12-08

## 2017-12-16 MED ORDER — AZITHROMYCIN 250 MG TABLET
PACK | 0 refills | 0 days | Status: CP
Start: 2017-12-16 — End: 2017-12-21

## 2017-12-27 ENCOUNTER — Other Ambulatory Visit: Payer: Self-pay | Admitting: *Deleted

## 2017-12-27 ENCOUNTER — Other Ambulatory Visit: Payer: Self-pay | Admitting: Infectious Disease

## 2017-12-27 DIAGNOSIS — B20 Human immunodeficiency virus [HIV] disease: Secondary | ICD-10-CM

## 2017-12-27 MED ORDER — DOLUTEGRAVIR SODIUM 50 MG PO TABS: ORAL_TABLET | ORAL | 0 refills | Status: DC

## 2017-12-27 MED ORDER — EMTRICITABINE-TENOFOVIR AF 200-25 MG PO TABS: 1.0000 | ORAL_TABLET | Freq: Every day | ORAL | 0 refills | Status: DC

## 2017-12-30 ENCOUNTER — Encounter: Payer: Self-pay | Admitting: Infectious Disease

## 2017-12-30 ENCOUNTER — Ambulatory Visit (INDEPENDENT_AMBULATORY_CARE_PROVIDER_SITE_OTHER): Payer: Medicare Other | Admitting: Infectious Disease

## 2017-12-30 VITALS — BP 142/82 | HR 101 | Temp 98.1°F | Ht 72.0 in | Wt 237.0 lb

## 2017-12-30 DIAGNOSIS — B20 Human immunodeficiency virus [HIV] disease: Secondary | ICD-10-CM | POA: Diagnosis not present

## 2017-12-30 DIAGNOSIS — L2081 Atopic neurodermatitis: Secondary | ICD-10-CM | POA: Diagnosis not present

## 2017-12-30 DIAGNOSIS — Z23 Encounter for immunization: Secondary | ICD-10-CM

## 2017-12-30 DIAGNOSIS — I10 Essential (primary) hypertension: Secondary | ICD-10-CM | POA: Diagnosis not present

## 2017-12-30 DIAGNOSIS — Z7901 Long term (current) use of anticoagulants: Secondary | ICD-10-CM

## 2017-12-30 DIAGNOSIS — L209 Atopic dermatitis, unspecified: Secondary | ICD-10-CM

## 2017-12-30 HISTORY — DX: Atopic dermatitis, unspecified: L20.9

## 2017-12-30 MED ORDER — BICTEGRAVIR-EMTRICITAB-TENOFOV 50-200-25 MG PO TABS: 1.0000 | ORAL_TABLET | Freq: Every day | ORAL | 11 refills | Status: DC

## 2017-12-30 NOTE — Progress Notes (Signed)
Chief complaint: Follow-up for HIV on antiretrovirals Subjective:    Patient ID: Craig [name], male    DOB: 1961/07/31, 57 y.o.   MRN: [id number]  HPI  57 year old African American male with HIV, multiple medical problems including eczema (on MTX), DVT with PE, dCHF, HTN, ? MI, prior AKI previously cared for  at [facility] where he had been receiving his care.  I was able in fact to read my note on him from when I was his HIV provider at [facility] in 2006. He had been on a regimen of videx, Emtriva, and Reyataz 400 mg and was perfectly suppressed then was changed over to Reyataz, Norvir and truvada with apparent perfect virological suppression then change to n PRezista, Norvir and Truvada.   His  ARV regimen changed by Dr. Cardell Peach who saw him and changed him to Tivicay and Truvada to avoid Norvir interaction with advair that he was being rx and to simplify his regimen.   He has maintained perfect virological suppression since then and we changed him in fact to [facility] and DESCOVY.   Lab Results  Component Value Date   HIV1RNAQUANT <20 NOT DETECTED 12/09/2017   HIV1RNAQUANT <20 12/10/2016   HIV1RNAQUANT <20 01/12/2016       Lab Results  Component Value Date   CD4TABS 400 12/09/2017   CD4TABS 570 12/10/2016   CD4TABS 280 (L) 01/12/2016    He is currently being treated by dermatology at [facility] including Dr. Dreama Saa and Dr. Sandrea Matte and they are on templating treating him with dupixent since his atopic dermatitis has been so difficult to manage even with MTX.  I do not see ANY DDI with his ARV and this antibody.  I will him to [facility] to consolidate his antiretroviral to a single tablet regimen.       Past Medical History:  Diagnosis Date  . AIDS (HCC) 05/24/2015  . Allergic rhinitis 01/25/2016  . 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 Deep vein thrombosis (HCC) 05/09/2011   Overview:  Jan  2012   . Diabetes mellitus, controlled (HCC) 01/25/2016  . Dysplasia of anus   . Esophageal reflux   . 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 Hernia, inguinal 04/09/2013  . HIV (human immunodeficiency virus infection) (HCC)   . Horseshoe tear of retina without detachment 02-13-12  . 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  . Molluscum contagiosum 09-29-2012  . 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 infarction   . Other convulsions   . PE (pulmonary embolism) 04/09/2013   Overview:  Jan 2012   . Personal history of venous thrombosis and embolism   . Secondary iridocyclitis, infectious   . Septic arthritis of wrist (HCC)    rt  . Shortness of breath dyspnea   . Unspecified hereditary and idiopathic peripheral neuropathy   . 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    Past Surgical History:  Procedure Laterality Date  . COLONOSCOPY    . I&D EXTREMITY Right 07/28/2015   Procedure: IRRIGATION AND DEBRIDEMENT WRIST;  Surgeon: Knute Neu, MD;  Location: [facility] OR;  Service: Plastics;  Laterality: Right;    No family history on file.    Social History   Socioeconomic History  . Marital status: Single    Spouse name: None  . Number of children: None  . Years of education: None  . Highest education level: None  Social Needs  . Financial resource strain: None  . Food insecurity - worry: None  . Food insecurity -  inability: None  . Transportation needs - medical: None  . Transportation needs - non-medical: None  Occupational History  . None  Tobacco Use  . Smoking status: Never Smoker  . Smokeless tobacco: Never Used  Substance and Sexual Activity  . Alcohol use: No    Alcohol/week: 0.0 oz  . Drug use: No  . Sexual activity: Not Currently    Partners: Male    Birth control/protection: Condom  Other Topics Concern  . None  Social History Narrative  . None    Allergies  Allergen Reactions  . Penicillins Hives and Rash    Has patient had a PCN reaction causing immediate rash, facial/tongue/throat  swelling, SOB or lightheadedness with hypotension:YES Has patient had a PCN reaction causing Has patient had a PCN reaction that required hospitalization NO Has patient had a PCN reaction occurring within the last 10 years: YES If all of the above answers are "NO", then may proceed with Cephalosporin use.  . Shellfish Allergy Shortness Of Breath  . Latex Rash     Current Outpatient Medications:  .  ACCU-CHEK AVIVA PLUS test strip, , Disp: , Rfl:  .  Alcohol Swabs (ALCOHOL PREP) 70 % PADS, , Disp: , Rfl:  .  ASSURE COMFORT LANCETS 30G MISC, , Disp: , Rfl:  .  atorvastatin (LIPITOR) 40 MG tablet, Take 40 mg by mouth daily., Disp: , Rfl:  .  citalopram (CELEXA) 40 MG tablet, Take 40 mg by mouth daily., Disp: , Rfl:  .  clobetasol ointment (TEMOVATE) 0.05 %, Apply 1 application topically 2 (two) times daily as needed (rash). , Disp: , Rfl:  .  Colchicine 0.6 MG CAPS, One po bid, Disp: 30 capsule, Rfl: 0 .  dolutegravir (TIVICAY) 50 MG tablet, TAKE 1 TABLET(50 MG) BY MOUTH DAILY, Disp: 30 tablet, Rfl: 0 .  emtricitabine-tenofovir AF (DESCOVY) 200-25 MG tablet, Take 1 tablet by mouth daily., Disp: 30 tablet, Rfl: 0 .  folic acid (FOLVITE) 1 MG tablet, Take 4 mg by mouth See admin instructions. Take everyday except on Friday., Disp: , Rfl:  .  glipiZIDE (GLUCOTROL XL) 2.5 MG 24 hr tablet, Take 2.5 mg by mouth., Disp: , Rfl:  .  HYDROcodone-homatropine (HYCODAN) 5-1.5 MG/5ML syrup, Take 5 mLs by mouth every 6 (six) hours as needed for cough., Disp: 60 mL, Rfl: 0 .  hydrOXYzine (ATARAX/VISTARIL) 25 MG tablet, Take 25 mg by mouth every 6 (six) hours as needed for anxiety. , Disp: , Rfl:  .  lisinopril (PRINIVIL,ZESTRIL) 2.5 MG tablet, Take 2.5 mg by mouth daily., Disp: , Rfl:  .  metFORMIN (GLUCOPHAGE) 500 MG tablet, Take 1 tablet (500 mg total) by mouth 2 (two) times daily with a meal., Disp: 60 tablet, Rfl: 1 .  metFORMIN (GLUCOPHAGE-XR) 500 MG 24 hr tablet, TK 2 TS PO Q MORNING BEFORE BREAKFAST,  Disp: , Rfl: 3 .  methotrexate (RHEUMATREX) 2.5 MG tablet, Take 8 tablets once a week on Friday., Disp: , Rfl: 1 .  triamcinolone ointment (KENALOG) 0.1 %, Apply 1 application topically 2 (two) times daily as needed (for itching). On Face, Disp: , Rfl: 0 .  valACYclovir (VALTREX) 1000 MG tablet, TK 1 T PO TID FOR 7 DAYS, Disp: , Rfl: 0 .  warfarin (COUMADIN) 5 MG tablet, Take 5-7.5 mg by mouth daily. Take 1.5 (7.5mg ) daily except Sunday take 1 tab, Disp: , Rfl:  .  albuterol (PROVENTIL HFA;VENTOLIN HFA) 108 (90 BASE) MCG/ACT inhaler, Inhale 2 puffs into the lungs every 6 (  six) hours as needed for wheezing or shortness of breath., Disp: , Rfl:  .  Dextromethorphan-Guaifenesin (ROBITUSSIN DM PO), Take by mouth., Disp: , Rfl:  .  fluticasone (FLONASE) 50 MCG/ACT nasal spray, USE 1 SPRAY EACH NOSTRIL DAILY AS NEEDED FOR ALLERIES, Disp: , Rfl: 0 .  oxyCODONE-acetaminophen (PERCOCET/ROXICET) 5-325 MG tablet, Take 2 tablets by mouth every 4 (four) hours as needed for severe pain. (Patient not taking: Reported on 12/30/2017), Disp: 15 tablet, Rfl: 0 .  Phenylephrine-Guaifenesin (MUCUS RELIEF D PO), Take by mouth., Disp: , Rfl:  .  terbinafine (LAMISIL) 250 MG tablet, , Disp: , Rfl:      Review of Systems  Constitutional: Negative for activity change, appetite change, chills, diaphoresis, fatigue, fever and unexpected weight change.  HENT: Negative for congestion, rhinorrhea, sinus pressure, sneezing, sore throat and trouble swallowing.   Eyes: Negative for photophobia and visual disturbance.  Respiratory: Negative for cough, chest tightness, shortness of breath, wheezing and stridor.   Cardiovascular: Negative for chest pain, palpitations and leg swelling.  Gastrointestinal: Negative for abdominal distention, abdominal pain, anal bleeding, blood in stool, constipation, diarrhea, nausea and vomiting.  Genitourinary: Negative for difficulty urinating, dysuria, flank pain and hematuria.  Musculoskeletal:  Negative for arthralgias, back pain, gait problem and joint swelling.  Skin: Positive for rash. Negative for color change, pallor and wound.  Neurological: Negative for dizziness, tremors, weakness and light-headedness.  Hematological: Negative for adenopathy. Does not bruise/bleed easily.  Psychiatric/Behavioral: Negative for agitation, behavioral problems, confusion, decreased concentration, dysphoric mood and sleep disturbance.       Objective:   Physical Exam  Constitutional: He is oriented to person, place, and time. He appears well-developed and well-nourished. No distress.  HENT:  Head: Normocephalic and atraumatic.  Mouth/Throat: Oropharynx is clear and moist. No oropharyngeal exudate.  Eyes: Conjunctivae and EOM are normal. No scleral icterus.  Neck: Normal range of motion. Neck supple. No JVD present.  Cardiovascular: Normal rate and regular rhythm.  Pulmonary/Chest: Effort normal and breath sounds normal. No respiratory distress.  Abdominal: Soft. He exhibits no distension.  Musculoskeletal: He exhibits edema. He exhibits no tenderness.  Lymphadenopathy:    He has no cervical adenopathy.  Neurological: He is alert and oriented to person, place, and time. He exhibits normal muscle tone. Coordination normal.  Skin: Skin is warm and dry. He is not diaphoretic. No erythema. No pallor.  Psychiatric: He has a normal mood and affect. His behavior is normal. Judgment and thought content normal.          Assessment & Plan:   #1 HIV: Change to BIKTARVY and recheck labs in 2 months  #2 Syphilis: NON-REACTIVE (12/17 1046)   #3.diabetes mellitus on metformin. I am changing his ARV to [facility] which has less DDI with metformin  #4 deep venous thrombosis on warfarin  #5 Atopic dermatitis: I am not opposed to him starting dupixent. Certainly his HIV is not an issue nor are the ARVs as far as DDI

## 2018-01-01 ENCOUNTER — Encounter: Admit: 2018-01-01 | Discharge: 2018-01-01 | Payer: MEDICARE

## 2018-01-01 DIAGNOSIS — I8290 Acute embolism and thrombosis of unspecified vein: Principal | ICD-10-CM

## 2018-01-08 DIAGNOSIS — M5137 Other intervertebral disc degeneration, lumbosacral region: Secondary | ICD-10-CM | POA: Insufficient documentation

## 2018-01-14 MED ORDER — METFORMIN ER 500 MG TABLET,EXTENDED RELEASE 24 HR
ORAL_TABLET | Freq: Every day | ORAL | 3 refills | 0.00000 days | Status: CP
Start: 2018-01-14 — End: 2018-10-16

## 2018-01-15 ENCOUNTER — Encounter: Admit: 2018-01-15 | Discharge: 2018-01-15 | Payer: MEDICARE

## 2018-01-15 DIAGNOSIS — E119 Type 2 diabetes mellitus without complications: Secondary | ICD-10-CM

## 2018-01-15 DIAGNOSIS — I8290 Acute embolism and thrombosis of unspecified vein: Principal | ICD-10-CM

## 2018-01-15 DIAGNOSIS — J069 Acute upper respiratory infection, unspecified: Secondary | ICD-10-CM

## 2018-01-15 MED ORDER — AZITHROMYCIN 250 MG TABLET
PACK | 0 refills | 0 days | Status: CP
Start: 2018-01-15 — End: 2018-01-20

## 2018-01-17 ENCOUNTER — Encounter: Payer: Self-pay | Admitting: Infectious Disease

## 2018-01-20 MED ORDER — DUPILUMAB 300 MG/2 ML SUBCUTANEOUS SYRINGE: Syringe | 3 refills | 0 days | Status: AC

## 2018-01-20 MED ORDER — DUPILUMAB 300 MG/2 ML SUBCUTANEOUS SYRINGE
SUBCUTANEOUS | 6 refills | 0.00000 days | Status: CP
Start: 2018-01-20 — End: 2018-12-10

## 2018-01-20 MED ORDER — DUPILUMAB 300 MG/2 ML SUBCUTANEOUS SYRINGE: mL | 5 refills | 0 days

## 2018-01-22 ENCOUNTER — Encounter: Admit: 2018-01-22 | Discharge: 2018-01-23 | Payer: MEDICARE

## 2018-01-22 DIAGNOSIS — Z79899 Other long term (current) drug therapy: Principal | ICD-10-CM

## 2018-01-22 DIAGNOSIS — L209 Atopic dermatitis, unspecified: Secondary | ICD-10-CM

## 2018-01-22 DIAGNOSIS — L2084 Intrinsic (allergic) eczema: Secondary | ICD-10-CM

## 2018-01-22 DIAGNOSIS — Z5181 Encounter for therapeutic drug level monitoring: Secondary | ICD-10-CM

## 2018-01-22 LAB — CBC W/ AUTO DIFF
BASOPHILS ABSOLUTE COUNT: 0 10*9/L (ref 0.0–0.1)
HEMATOCRIT: 40.9 % — ABNORMAL LOW (ref 41.0–53.0)
HEMOGLOBIN: 12.7 g/dL — ABNORMAL LOW (ref 13.5–17.5)
LARGE UNSTAINED CELLS: 3 % (ref 0–4)
LYMPHOCYTES ABSOLUTE COUNT: 1 10*9/L — ABNORMAL LOW (ref 1.5–5.0)
MEAN CORPUSCULAR HEMOGLOBIN CONC: 31 g/dL (ref 31.0–37.0)
MEAN CORPUSCULAR HEMOGLOBIN: 27.9 pg (ref 26.0–34.0)
MEAN CORPUSCULAR VOLUME: 89.8 fL (ref 80.0–100.0)
MEAN PLATELET VOLUME: 7.8 fL (ref 7.0–10.0)
MONOCYTES ABSOLUTE COUNT: 0.5 10*9/L (ref 0.2–0.8)
NEUTROPHILS ABSOLUTE COUNT: 4.2 10*9/L (ref 2.0–7.5)
PLATELET COUNT: 314 10*9/L (ref 150–440)
RED BLOOD CELL COUNT: 4.55 10*12/L (ref 4.50–5.90)
RED CELL DISTRIBUTION WIDTH: 15.2 % — ABNORMAL HIGH (ref 12.0–15.0)
WBC ADJUSTED: 6 10*9/L (ref 4.5–11.0)

## 2018-01-22 LAB — HEPATIC FUNCTION PANEL
ALBUMIN: 3.9 g/dL (ref 3.5–5.0)
ALKALINE PHOSPHATASE: 70 U/L (ref 38–126)
ALT (SGPT): 31 U/L (ref 19–72)
AST (SGOT): 36 U/L (ref 19–55)
BILIRUBIN DIRECT: <0.1 mg/dL (ref 0.00–0.40)

## 2018-01-22 LAB — NEUTROPHILS ABSOLUTE COUNT: Lab: 4.2

## 2018-01-22 LAB — BILIRUBIN DIRECT: Bilirubin.glucuronidated:MCnc:Pt:Ser/Plas:Qn:: <0.1

## 2018-01-22 LAB — BURR CELLS

## 2018-01-22 MED ORDER — METHOTREXATE SODIUM 2.5 MG TABLET
ORAL_TABLET | ORAL | 4 refills | 0.00000 days | Status: CP
Start: 2018-01-22 — End: 2018-02-05

## 2018-01-22 NOTE — Unmapped (Addendum)
Stop the terbinafine now that your scalp is healed.     Increase your methotrexate to 10 pills weekly.

## 2018-01-22 NOTE — Unmapped (Signed)
Dermatology Follow-up Note    A/P:    Kerion, c/b impetiginization: risk factors include immunosuppression (HIV, MTX therapy)  - Resolved, stop terbinafine    Atopic Dermatitis  High risk med use  Continuing to flare on trunk and extremities.  He is using TAC without much improvement.  Has not been responding to methotrexate at this point.   - Will continue mtx while we wait for dupixent approval. PA sent 01/21/18. His ID physician (Dr. Daiva Eves at [facility]) is OK with dupixent  - Increase methotrexate to 25mg  weekly. Continue folic acid 1mg  on non methotrexate days.  - Continue betamethasone for severe areas twice daily, TAC for thinner areas BID PRN.  - Will repeat MTX monitoring labs today    Return in about 4 months (around 05/22/2018) for Recheck.     CC:  Follow up AD and kerion    HPI:  Ryan Davenport is a 57 year old male last seen by Dr. Dub Mikes 11/18 for kerion of L scalp/face and AD on MTX as below, all in setting of well-controlled HIV.    He completed a course of bactrim for MSSA in the kerion, and is still currently taking terbinafine. Feels his scalp is substantially improved with good hair regrowth in the area that was involved.     Plan last visit was to start dupixent given severe atopic dermatitis recalcitrant to methotrexate and class I topical steroids. This was discussed with his ID physicians who are OK with [place] starting it. Rx has been sent and PA was submitted 01/21/18 with no decision yet. Still overall reports severe AD of arms, torso, legs with severe itching and no relief with topical corticosteroids.     Pertinent PMH:  No history of skin cancer  HIV, well controlled, on HAART  Atopic dermatitis, on MTX   - Fibroscan 07/2016 w/ F0, at cumulative dose of 3180 mg    SH:  Lives in [place], [place]    ROS:   Negative except as noted in HPI.    PE:  General: Well-developed, well-nourished male, in no acute distress.   Neuro: Alert and oriented, and answers questions appropriately.  Skin: Per patient request, focused exam with inspection and palpation of the scalp, face, neck, bilateral arms, abdomen was performed today. Findings were normal with exception of the following:  - Multiple eczematous plaques on trunk and extremities with associated excoriations  - Resolved kerion on left scalp with hair regrowth.   - All other areas examined were normal or had no significant findings      The patient was seen and examined by Ria Bush, MD who agrees with the assessment and plan as above.

## 2018-01-23 NOTE — Unmapped (Signed)
Notified patient of acceptable monitoring labs by letter. Will continue MTX until dupixent approved.

## 2018-01-26 NOTE — Unmapped (Unsigned)
DUPIXENT APPROVED FOR $8.50 FOR 84 DAYS SUPPLY.

## 2018-01-28 MED ORDER — EMPTY CONTAINER
2 refills | 0 days
Start: 2018-01-28 — End: 2019-01-28

## 2018-01-29 MED FILL — SHARPS KIT/NA/MISC: SHARPS KIT/NA/MISC | 120 days supply | Qty: 1 | Fill #0

## 2018-01-29 MED FILL — DUPIXENT (ML)/300MG/2ML/SOSY: DUPIXENT (ML)/300MG/2ML/SOSY | 28 days supply | Qty: 8 | Fill #0

## 2018-01-29 NOTE — Unmapped (Signed)
[facility]   Patient Onboarding/Medication Counseling    [name] is a 57 y.o. male with Atopic Dermatitis who I am counseling today on initiation of therapy.    Medication: Dupixent    Verified patient's date of birth / HIPAA.      Education Provided: ??    Dose/Administration discussed: 2 (300mg /35mL) syringes SubQ on day 0, then 1 syringe every other week. This medication should be taken  without regard to food.     Storage requirements: this medicine should be stored in the refrigerator.     Side effects discussed: Discussed common side effects, including injection site reaction, conjunctivitis. If patient experiences these side effects, they need to call the doctor.  Patient will receive a Lexi-Comp drug information handout with shipment.    Handling precautions reviewed:  Patient will dispose of needles in a sharps container or empty laundry detergent bottle.    Drug Interactions: other medications reviewed and up to date in Epic.  No drug interactions identified.    Comorbidities/Allergies: reviewed and up to date in Epic.    Verified therapy is appropriate and should continue      Delivery Information    Anticipated copay of $8.50 reviewed with patient. Verified delivery address in [facility]SI and reviewed medication storage requirement.    Scheduled delivery date: 01/30/2018    Explained that we ship using UPS and this shipment will not require a signature.      Explained the services we provide at [facility] and that each month we would call to set up refills.  Stressed importance of returning phone calls so that we could ensure they receive their medications in time each month.  Informed patient that we should be setting up refills 7-10 days prior to when they will run out of medication.  Informed patient that welcome packet will be sent.      Patient verbalized understanding of the above information as well as how to contact the pharmacy at [phone] option 4 with any questions/concerns.        Patient Specific Needs      ? Patient expressed uncertainty administering injection to himself - plans to return to clinic to receive instructions on proper administration technique.     ? Patient has no physical or cognitive barriers.     ? Patient prefers to have medications discussed with  Patient     ? Patient is able to read and understand education materials at a high school level or above.        Ryan Davenport  [facility] - PY[employer] Pharmacy Student

## 2018-01-29 NOTE — Unmapped (Signed)
Called, left voicemial

## 2018-01-31 NOTE — Unmapped (Signed)
Please click on Chart from this encounter, double click on this encounter from the chart,  and follow these instructions.     See Patient-Level Documents below. Locate recently scanned External Records labeled Print and Sign. Click hyperlink to view and print paperwork. Please complete within 2 business days.     Once completed, please place in nearest Internal Medicine outbox to be faxed.     Please let me know if I can do anything to help you. Thanks.

## 2018-02-05 ENCOUNTER — Encounter: Admit: 2018-02-05 | Discharge: 2018-02-05 | Payer: MEDICARE

## 2018-02-05 ENCOUNTER — Encounter: Admit: 2018-02-05 | Discharge: 2018-02-05 | Payer: MEDICARE | Attending: Ophthalmology | Primary: Ophthalmology

## 2018-02-05 DIAGNOSIS — I8290 Acute embolism and thrombosis of unspecified vein: Principal | ICD-10-CM

## 2018-02-05 DIAGNOSIS — L209 Atopic dermatitis, unspecified: Secondary | ICD-10-CM

## 2018-02-05 DIAGNOSIS — H539 Unspecified visual disturbance: Secondary | ICD-10-CM

## 2018-02-05 NOTE — Unmapped (Addendum)
Assessment/Plan:     Current warfarin Dose:  7.5mg  daily  Steady State:yes  Date INR: February 05, 2018  Goal INR: 2-3  INR Results: 3.0  Lab Results   Component Value Date    INR 3.0 02/05/2018    INR 2.3 01/15/2018    INR 2.7 01/01/2018    INR 2.60 05/28/2017    INR 2.7 03/30/2015    INR 2.1 02/23/2015       ASSESSMENT/PLAN:  Plan for Warfarin Dose:  continue current dose    1. INR therapeutic  2. URI - resolving  3. Atypical dermatitis - now off MTX, folic acid; started Dupexent approx 2 wks ago  4. Vision changes - has ophth f/u tomorrow    Repeat INR Date:  4 wks    Subject/Objective:     Patient: Ryan Davenport 57 y.o.    Medical Record Number:  [id number]    PRIMARY CARE PHYSICIAN: Santa Genera, MD    XB:[id number] VTE    PMHx:   Patient Active Problem List   Diagnosis   ??? 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 Allergy to latex   ??? Asthma, chronic (RAF-HCC)   ??? Chronic cardiopulmonary disease (CMS-HCC)   ??? Chronic diastolic heart failure (CMS-HCC)   ??? Cytomegaloviral disease (CMS-HCC)   ??? Dermatitis   ??? Dysplasia of anus   ??? Enthesopathy of knee   ??? Esophageal reflux   ??? Gout   ??? Horseshoe tear of retina   ??? Human immunodeficiency virus (HIV) disease (CMS-HCC)   ??? Essential hypertension   ??? Molluscum contagiosum   ??? Mononeuritis   ??? 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 infarction   ??? Convulsions (CMS-HCC)   ??? Hereditary and idiopathic peripheral neuropathy   ??? Inguinal hernia   ??? Early syphilis, secondary syphilis   ??? Coronary atherosclerosis (RAF-HCC)   ??? Acid reflux (RAF-HCC)   ??? Chronic eczema   ??? Depressive disorder, not elsewhere classified   ??? Allergic rhinitis   ??? Diabetes mellitus, controlled (CMS-HCC)   ??? Diastasis recti   ??? AIDS (CMS-HCC)   ??? Obesity (BMI 30.0-34.9)   ??? Disc disease, degenerative, lumbar or lumbosacral          Your Medication List          Accurate as of 02/05/18  1:40 PM. If you have any questions, ask your nurse or doctor.               STOP taking these medications folic acid 1 MG tablet  Commonly known as:  FOLVITE  Stopped by:  Dwana Melena, PA     HYDROcodone-homatropine 5-1.5 mg/5 mL syrup  Commonly known as:  HYCODAN  Stopped by:  Dwana Melena, PA     methotrexate 2.5 MG tablet  Stopped by:  Dwana Melena, PA        CONTINUE taking these medications    alcohol swabs Padm  Use to check blood sugars once daily. ICD-10 E11.9 (Type 2 DM). Dispense insurance approved     atorvastatin 40 MG tablet  Commonly known as:  LIPITOR  Take 1 tablet (40 mg total) by mouth daily.     betamethasone dipropionate 0.05 % ointment  Commonly known as:  DIPROLENE  Apply topically Two (2) times a day.     BIKTARVY 50-200-25 mg tablet  Generic drug:  bictegrav-emtricit-tenofov ala  Take 1 tablet by mouth daily at 0600.     blood sugar diagnostic Strp  Commonly  known as:  ACCU-CHEK AVIVA PLUS TEST STRP  by Other route once daily. ICD-10 E11.9     blood-glucose meter kit  Use to check blood sugars once daily. ICD-10 E11.9 (Type 2 DM). Dispense insurance approved     citalopram 40 MG tablet  Commonly known as:  CeleXA  Take 1 tablet (40 mg total) by mouth daily.     colchicine 0.6 mg tablet  Take 1 tablet (0.6 mg total) by mouth Two (2) times a day prn gout     dupilumab 300 mg/2 mL Syrg injection  Commonly known as:  DUPIXENT  Inject 600mg  (4ml) subcutaneously on day 0, then 300mg  subcutaneously every 14 days.     fluticasone 50 mcg/actuation nasal spray  Commonly known as:  FLONASE  USE 1 SPRAY IEN D     glipiZIDE 2.5 MG 24 hr tablet  Commonly known as:  GLUCOTROL XL  TAKE 1 TABLET BY MOUTH DAILY     hydrOXYzine 25 MG tablet  Commonly known as:  ATARAX  Take 1 tablet (25 mg total) by mouth nightly as needed for itching.     lisinopril 2.5 MG tablet  Commonly known as:  PRINIVIL,ZESTRIL  TAKE 1 TABLET BY MOUTH EVERY DAY     metFORMIN 500 MG 24 hr tablet  Commonly known as:  GLUCOPHAGE-XR  Take 2 tablets (1,000 mg total) by mouth every morning before breakfast.     oxyCODONE-acetaminophen 5-325 mg per tablet  Commonly known as:  PERCOCET  Take 1 tablet by mouth every four (4) hours as needed for pain.     triamcinolone 0.1 % cream  Commonly known as:  KENALOG  Apply twice a day to affected areas     triamcinolone 0.1 % ointment  Commonly known as:  KENALOG  Apply twice a day to affected areas     warfarin 5 MG tablet  Commonly known as:  COUMADIN  7.5mg  (1.5 tabs) daily or as directed              General:  Well appearing, no acute distress    Signs/Symptoms bleeding or bruising: no    Signs/Symptoms of Potential Embolic Events: no    Changes In:   Diet:no   Medications:yes   Activities / Lifestyle:no   Health Status:no   Nonadherent: no       Current EtOH Use:no   Current Smoker/tobacco user: no    Medications reviewed including OTC and herbals:yes    The patient set a personal goal for anticoagulation management today:Take warfarin daily as recommended.    Additional History:

## 2018-02-06 ENCOUNTER — Encounter: Admit: 2018-02-06 | Discharge: 2018-02-06 | Payer: MEDICARE | Attending: Ophthalmology | Primary: Ophthalmology

## 2018-02-06 DIAGNOSIS — L309 Dermatitis, unspecified: Secondary | ICD-10-CM

## 2018-02-06 DIAGNOSIS — E119 Type 2 diabetes mellitus without complications: Principal | ICD-10-CM

## 2018-02-06 LAB — CBC W/ AUTO DIFF
BASOPHILS ABSOLUTE COUNT: 0 10*9/L (ref 0.0–0.1)
BASOPHILS RELATIVE PERCENT: 0.5 %
EOSINOPHILS ABSOLUTE COUNT: 0.1 10*9/L (ref 0.0–0.4)
EOSINOPHILS RELATIVE PERCENT: 1.6 %
HEMATOCRIT: 39.8 % — ABNORMAL LOW (ref 41.0–53.0)
HEMOGLOBIN: 12.5 g/dL — ABNORMAL LOW (ref 13.5–17.5)
LARGE UNSTAINED CELLS: 3 % (ref 0–4)
LYMPHOCYTES ABSOLUTE COUNT: 1 10*9/L — ABNORMAL LOW (ref 1.5–5.0)
LYMPHOCYTES RELATIVE PERCENT: 17.2 %
MEAN CORPUSCULAR HEMOGLOBIN: 27.5 pg (ref 26.0–34.0)
MEAN CORPUSCULAR VOLUME: 87.2 fL (ref 80.0–100.0)
MEAN PLATELET VOLUME: 6.9 fL — ABNORMAL LOW (ref 7.0–10.0)
MONOCYTES ABSOLUTE COUNT: 0.4 10*9/L (ref 0.2–0.8)
MONOCYTES RELATIVE PERCENT: 6.8 %
NEUTROPHILS ABSOLUTE COUNT: 4.2 10*9/L (ref 2.0–7.5)
NEUTROPHILS RELATIVE PERCENT: 71.3 %
RED BLOOD CELL COUNT: 4.57 10*12/L (ref 4.50–5.90)
RED CELL DISTRIBUTION WIDTH: 14.8 % (ref 12.0–15.0)
WBC ADJUSTED: 5.8 10*9/L (ref 4.5–11.0)

## 2018-02-06 LAB — COMPREHENSIVE METABOLIC PANEL
ALBUMIN: 3.7 g/dL (ref 3.5–5.0)
ALKALINE PHOSPHATASE: 74 U/L (ref 38–126)
ALT (SGPT): 28 U/L (ref 19–72)
ANION GAP: 7 mmol/L — ABNORMAL LOW (ref 9–15)
AST (SGOT): 32 U/L (ref 19–55)
BLOOD UREA NITROGEN: 8 mg/dL (ref 7–21)
BUN / CREAT RATIO: 9
CALCIUM: 8.9 mg/dL (ref 8.5–10.2)
CHLORIDE: 105 mmol/L (ref 98–107)
CO2: 27 mmol/L (ref 22.0–30.0)
EGFR MDRD AF AMER: 104 mL/min/{1.73_m2} (ref >=60)
EGFR MDRD NON AF AMER: 85 mL/min/{1.73_m2} (ref >=60)
POTASSIUM: 4.9 mmol/L (ref 3.5–5.0)
PROTEIN TOTAL: 7.9 g/dL (ref 6.5–8.3)
SODIUM: 139 mmol/L (ref 135–145)

## 2018-02-06 LAB — RHEUMATOID FACTOR: Rheumatoid factor:ACnc:Pt:Ser/Plas:Qn:: <8.6

## 2018-02-06 LAB — CO2: Carbon dioxide:SCnc:Pt:Ser/Plas:Qn:: 27

## 2018-02-06 LAB — LYMPHOCYTES ABSOLUTE COUNT: Lab: 1 — ABNORMAL LOW

## 2018-02-06 NOTE — Unmapped (Signed)
57 yr old male with h/o iritis, OD and is treated for eczema with  Dupilumab.  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  The left eye is normal. IOP is elevated, right eye; Will begin Simbrinza today 3x/day    Plan:  CMP, CBC, ANA, RF    Per local ophthalmologist:  Pred forte QID; Simbrinza TID    RTC 2 - 3 weeks

## 2018-02-06 NOTE — Unmapped (Signed)
Use artificial tears as needed for dry eye symptoms.        For red eye, right eye use Ketorolac 3x/daily for one week, then stop

## 2018-02-07 LAB — ANA PATTERN 1

## 2018-02-21 ENCOUNTER — Other Ambulatory Visit: Payer: Medicare Other

## 2018-02-21 DIAGNOSIS — B20 Human immunodeficiency virus [HIV] disease: Secondary | ICD-10-CM

## 2018-02-21 LAB — T-HELPER CELL (CD4) - (RCID CLINIC ONLY)
CD4 T CELL ABS: 550 /uL (ref 400–2700)
CD4 T CELL HELPER: 38 % (ref 33–55)

## 2018-02-24 ENCOUNTER — Encounter: Admit: 2018-02-24 | Discharge: 2018-02-25 | Payer: MEDICARE | Attending: Ophthalmology | Primary: Ophthalmology

## 2018-02-24 ENCOUNTER — Other Ambulatory Visit: Payer: Medicare Other

## 2018-02-24 DIAGNOSIS — E119 Type 2 diabetes mellitus without complications: Principal | ICD-10-CM

## 2018-02-24 DIAGNOSIS — E1139 Type 2 diabetes mellitus with other diabetic ophthalmic complication: Secondary | ICD-10-CM

## 2018-02-24 LAB — HIV-1 RNA QUANT-NO REFLEX-BLD
HIV 1 RNA Quant: <20 copies/mL
HIV-1 RNA Quant, Log: <1.3 Log copies/mL

## 2018-02-24 MED ORDER — PREDNISOLONE ACETATE 1 % EYE DROPS,SUSPENSION
Freq: Three times a day (TID) | OPHTHALMIC | 0 refills | 0 days | Status: CP
Start: 2018-02-24 — End: 2018-06-17

## 2018-02-24 MED ORDER — BRINZOLAMIDE 1 %-BRIMONIDINE 0.2 % EYE DROPS,SUSPENSION
Freq: Three times a day (TID) | OPHTHALMIC | 0 refills | 0 days | Status: CP
Start: 2018-02-24 — End: 2018-04-11

## 2018-02-24 NOTE — Unmapped (Signed)
57 yr old male with h/o HIV/Syphilis/CMV (Follows with Dr. Daiva Eves infectious disease in [place]) & iritis, OD and is treated for eczema with  Dupilumab.      1. 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  The left eye is normal. IOP was elevated on presentation in February. Likely Steroid response. IOP 9 today with Simbrinza TID. AC now quiet and patient denies pain.    Similar episode in 2013 which resolved after 1 week of oral antiviral then pred taper. Differential per [place] at that time Toxoplasmosis vs medication  induced vs syphilis vs other infectious cause     Positive FTA-ABS/RPR in 2011 &  2017 t 2/p treatment with RPR non-reactive 20/2018     01/2018 CMP, CBC, ANA, RF unrevealing    CD4 on 02/21/18 was 550. Viral load undetectable on 11/2017 Currently on HAART.    Currently:   0 Pred forte QID; Simbrinza TID    Plan:  - Pred taper 3-2-1. Continue Simbrinza  - Consider additional work-up testing for CMV/Syphilis/TB +/- AC TAP with viral PCR should condition worsen or fail to resolve      Per History/Not addressed today:  3. Diabetes without retinopathy  4. H/o CMV Retinitis    5. H/o HST OD    6. Lattice Degeneration OU  - S/p Laser Prophylaxis 12/2004    7. ERM OD    RTC 22mo

## 2018-02-26 NOTE — Unmapped (Signed)
Please click on Chart from this encounter, double click on this encounter from the chart,  and follow these instructions.     See Patient-Level Documents below. Locate recently scanned External Records labeled Print and Sign. Click hyperlink to view and print paperwork. Please complete within 2 business days.     Once completed, please place in nearest Internal Medicine outbox to be faxed.     Please let me know if I can do anything to help you. Thanks.

## 2018-03-01 NOTE — Unmapped (Signed)
Done. I am at [facility]. Will be faxed on Monday.

## 2018-03-05 MED FILL — DUPIXENT (ML)/300MG/2ML/SOSY: DUPIXENT (ML)/300MG/2ML/SOSY | 28 days supply | Qty: 4 | Fill #1

## 2018-03-05 NOTE — Unmapped (Signed)
Patient reports doing really well on Dupixent, already noticing improvement and less itching. Did stop mtx/folic acid as planned.    [facility] Refill and Clinical Coordination Note  Medication(s): Dupixent    Atul Delucia, DOB: 1960/12/29  Phone: [phone] (home) , Alternate phone contact: N/A  Shipping address: [address]  [name] [place] [address]  Phone or address changes today?: No  All above HIPAA information verified.  Insurance changes? No    Completed refill and clinical call assessment today to schedule patient's medication shipment from the [facility] [phone]).      MEDICATION RECONCILIATION    Confirmed the medication and dosage are correct and have not changed: Yes, regimen is correct and unchanged.    Were there any changes to your medication(s) in the past month:  No, there are no changes reported at this time.    ADHERENCE    Is this medicine transplant or covered by Medicare Part B? No.    Did you miss any doses in the past 4 weeks? No missed doses reported.  Adherence counseling provided? Not needed     SIDE EFFECT MANAGEMENT    Are you tolerating your medication?:  Numa reports tolerating the medication.  Side effect management discussed: None      Therapy is appropriate and should be continued.    Evidence of clinical benefit: See Epic note from na/ not seen since starting      FINANCIAL/SHIPPING    Delivery Scheduled: Yes, Expected medication delivery date: Friday, March 15   Additional medications refilled: No additional medications/refills needed at this time.    The patient will receive an FSI print out for each medication shipped and additional FDA Medication Guides as required.  Patient education from [place] or [name] may also be included in the shipment.    Hristopher did not have any additional questions at this time.    Delivery address validated in FSI scheduling system: Yes, address listed above is correct.      We will follow up with patient monthly for standard refill processing and delivery.      Thank you,  Tawanna Solo [facility] Specialty Pharmacist

## 2018-03-10 ENCOUNTER — Ambulatory Visit (INDEPENDENT_AMBULATORY_CARE_PROVIDER_SITE_OTHER): Payer: Medicare Other | Admitting: Infectious Disease

## 2018-03-10 ENCOUNTER — Encounter: Payer: Self-pay | Admitting: Infectious Disease

## 2018-03-10 ENCOUNTER — Other Ambulatory Visit (HOSPITAL_COMMUNITY)
Admission: RE | Admit: 2018-03-10 | Discharge: 2018-03-10 | Disposition: A | Discharge status: Home / Self Care | Payer: Medicare Other | Source: Ambulatory Visit | Attending: Infectious Disease | Admitting: Infectious Disease

## 2018-03-10 VITALS — BP 117/75 | HR 111 | Temp 98.6°F | Wt 225.0 lb

## 2018-03-10 DIAGNOSIS — J069 Acute upper respiratory infection, unspecified: Secondary | ICD-10-CM

## 2018-03-10 DIAGNOSIS — B20 Human immunodeficiency virus [HIV] disease: Secondary | ICD-10-CM

## 2018-03-10 DIAGNOSIS — A549 Gonococcal infection, unspecified: Secondary | ICD-10-CM | POA: Diagnosis not present

## 2018-03-10 DIAGNOSIS — R112 Nausea with vomiting, unspecified: Secondary | ICD-10-CM

## 2018-03-10 DIAGNOSIS — J029 Acute pharyngitis, unspecified: Secondary | ICD-10-CM

## 2018-03-10 HISTORY — DX: Acute upper respiratory infection, unspecified: J06.9

## 2018-03-10 LAB — COMPLETE METABOLIC PANEL WITH GFR
AG Ratio: 0.7 (calc) — ABNORMAL LOW (ref 1.0–2.5)
ALKALINE PHOSPHATASE (APISO): 63 U/L (ref 40–115)
ALT: 15 U/L (ref 9–46)
AST: 23 U/L (ref 10–35)
Albumin: 3.6 g/dL (ref 3.6–5.1)
BILIRUBIN TOTAL: 0.4 mg/dL (ref 0.2–1.2)
BUN: 12 mg/dL (ref 7–25)
CHLORIDE: 103 mmol/L (ref 98–110)
CO2: 27 mmol/L (ref 20–32)
CREATININE: 1.18 mg/dL (ref 0.70–1.33)
Calcium: 9.1 mg/dL (ref 8.6–10.3)
GFR, Est African American: 80 mL/min/{1.73_m2} (ref >=60)
GFR, Est Non African American: 69 mL/min/{1.73_m2} (ref >=60)
GLOBULIN: 4.9 g/dL — AB (ref 1.9–3.7)
GLUCOSE: 119 mg/dL — AB (ref 65–99)
Potassium: 4.3 mmol/L (ref 3.5–5.3)
SODIUM: 135 mmol/L (ref 135–146)
Total Protein: 8.5 g/dL — ABNORMAL HIGH (ref 6.1–8.1)

## 2018-03-10 MED ORDER — ONDANSETRON HCL 4 MG PO TABS: 4.0000 mg | ORAL_TABLET | Freq: Three times a day (TID) | ORAL | 0 refills | Status: DC | PRN

## 2018-03-10 NOTE — Progress Notes (Signed)
Chief complaint: Sore throat with vomiting Subjective:    Patient ID: Craig [name], male    DOB: [birth year], 57 y.o.   MRN: [id number]  HPI  57 year old African American male with HIV, multiple medical problems including eczema (on MTX), DVT with PE, dCHF, HTN, ? MI, prior AKI previously cared for  at [facility]  I was able in fact to read my note on him from when I was his HIV provider at [facility] in 2006. He had been on a regimen of videx, Emtriva, and Reyataz 400 mg and was perfectly suppressed then was changed over to Reyataz, Norvir and truvada with apparent perfect virological suppression then change to n PRezista, Norvir and Truvada.   His  ARV regimen changed by Dr. Cardell Peach who saw him and changed him to Tivicay and Truvada to avoid Norvir interaction with advair that he was being rx and to simplify his regimen.   He has maintained perfect virological suppression since then and we changed him in fact to [facility] and DESCOVY --> BIKTARVY  He is currently being treated by dermatology at [facility] including Dr. Dreama Saa and Dr. Sandrea Matte and they are on templating treating him with dupixent since his atopic dermatitis has been so difficult to manage even with MTX.   He comes to clinic with new acute complaints of severe sore throat over the last week along with episodes of vomiting on Thursday Friday and Sunday.  He is having sinus congestion as well.  He denies fevers or chills.  He does not have any anti-emetics at home.  He denies being sexually active for the last several months.  He had one loose bowel movement this morning but not having diarrhea.   Past Medical History:  Diagnosis Date  . AIDS (HCC) 05/24/2015  . Allergic rhinitis 01/25/2016  . Atopic dermatitis 12/30/2017  . 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 Deep vein thrombosis (HCC) 05/09/2011   Overview:  Jan 2012   . Diabetes mellitus,  controlled (HCC) 01/25/2016  . Dysplasia of anus   . Esophageal reflux   . 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 Hernia, inguinal 04/09/2013  . HIV (human immunodeficiency virus infection) (HCC)   . Horseshoe tear of retina without detachment 02-13-12  . 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  . Molluscum contagiosum 09-29-2012  . 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 infarction   . Other convulsions   . PE (pulmonary embolism) 04/09/2013   Overview:  Jan 2012   . Personal history of venous thrombosis and embolism   . Secondary iridocyclitis, infectious   . Septic arthritis of wrist (HCC)    rt  . Shortness of breath dyspnea   . Unspecified hereditary and idiopathic peripheral neuropathy   . 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 Viral URI 03/10/2018    Past Surgical History:  Procedure Laterality Date  . COLONOSCOPY    . I&D EXTREMITY Right 07/28/2015   Procedure: IRRIGATION AND DEBRIDEMENT WRIST;  Surgeon: Knute Neu, MD;  Location: [facility] OR;  Service: Plastics;  Laterality: Right;    No family history on file.    Social History   Socioeconomic History  . Marital status: Single    Spouse name: None  . Number of children: None  . Years of education: None  . Highest education level: None  Social Needs  . Financial resource strain: None  . Food insecurity - worry: None  . Food insecurity - inability: None  . Transportation needs -  medical: None  . Transportation needs - non-medical: None  Occupational History  . None  Tobacco Use  . Smoking status: Never Smoker  . Smokeless tobacco: Never Used  Substance and Sexual Activity  . Alcohol use: No    Alcohol/week: 0.0 oz  . Drug use: No  . Sexual activity: Not Currently    Partners: Male    Birth control/protection: Condom  Other Topics Concern  . None  Social History Narrative  . None    Allergies  Allergen Reactions  . Penicillins Hives and Rash    Has patient had a PCN reaction causing immediate rash, facial/tongue/throat swelling,  SOB or lightheadedness with hypotension:YES Has patient had a PCN reaction causing Has patient had a PCN reaction that required hospitalization NO Has patient had a PCN reaction occurring within the last 10 years: YES If all of the above answers are "NO", then may proceed with Cephalosporin use.  . Shellfish Allergy Shortness Of Breath  . Latex Rash     Current Outpatient Medications:  .  ACCU-CHEK AVIVA PLUS test strip, , Disp: , Rfl:  .  albuterol (PROVENTIL HFA;VENTOLIN HFA) 108 (90 BASE) MCG/ACT inhaler, Inhale 2 puffs into the lungs every 6 (six) hours as needed for wheezing or shortness of breath., Disp: , Rfl:  .  Alcohol Swabs (ALCOHOL PREP) 70 % PADS, , Disp: , Rfl:  .  ASSURE COMFORT LANCETS 30G MISC, , Disp: , Rfl:  .  atorvastatin (LIPITOR) 40 MG tablet, Take 40 mg by mouth daily., Disp: , Rfl:  .  bictegravir-emtricitabine-tenofovir AF (BIKTARVY) 50-200-25 MG TABS tablet, Take 1 tablet by mouth daily., Disp: 30 tablet, Rfl: 11 .  citalopram (CELEXA) 40 MG tablet, Take 40 mg by mouth daily., Disp: , Rfl:  .  clobetasol ointment (TEMOVATE) 0.05 %, Apply 1 application topically 2 (two) times daily as needed (rash). , Disp: , Rfl:  .  Colchicine 0.6 MG CAPS, One po bid, Disp: 30 capsule, Rfl: 0 .  Dextromethorphan-Guaifenesin (ROBITUSSIN DM PO), Take by mouth., Disp: , Rfl:  .  fluticasone (FLONASE) 50 MCG/ACT nasal spray, USE 1 SPRAY EACH NOSTRIL DAILY AS NEEDED FOR ALLERIES, Disp: , Rfl: 0 .  folic acid (FOLVITE) 1 MG tablet, Take 4 mg by mouth See admin instructions. Take everyday except on Friday., Disp: , Rfl:  .  glipiZIDE (GLUCOTROL XL) 2.5 MG 24 hr tablet, Take 2.5 mg by mouth., Disp: , Rfl:  .  HYDROcodone-homatropine (HYCODAN) 5-1.5 MG/5ML syrup, Take 5 mLs by mouth every 6 (six) hours as needed for cough., Disp: 60 mL, Rfl: 0 .  hydrOXYzine (ATARAX/VISTARIL) 25 MG tablet, Take 25 mg by mouth every 6 (six) hours as needed for anxiety. , Disp: , Rfl:  .  lisinopril  (PRINIVIL,ZESTRIL) 2.5 MG tablet, Take 2.5 mg by mouth daily., Disp: , Rfl:  .  metFORMIN (GLUCOPHAGE) 500 MG tablet, Take 1 tablet (500 mg total) by mouth 2 (two) times daily with a meal., Disp: 60 tablet, Rfl: 1 .  metFORMIN (GLUCOPHAGE-XR) 500 MG 24 hr tablet, TK 2 TS PO Q MORNING BEFORE BREAKFAST, Disp: , Rfl: 3 .  methotrexate (RHEUMATREX) 2.5 MG tablet, Take 8 tablets once a week on Friday., Disp: , Rfl: 1 .  Phenylephrine-Guaifenesin (MUCUS RELIEF D PO), Take by mouth., Disp: , Rfl:  .  terbinafine (LAMISIL) 250 MG tablet, , Disp: , Rfl:  .  triamcinolone ointment (KENALOG) 0.1 %, Apply 1 application topically 2 (two) times daily as needed (for itching). On  Face, Disp: , Rfl: 0 .  valACYclovir (VALTREX) 1000 MG tablet, TK 1 T PO TID FOR 7 DAYS, Disp: , Rfl: 0 .  warfarin (COUMADIN) 5 MG tablet, Take 5-7.5 mg by mouth daily. Take 1.5 (7.5mg ) daily except Sunday take 1 tab, Disp: , Rfl:  .  ondansetron (ZOFRAN) 4 MG tablet, Take 1 tablet (4 mg total) by mouth every 8 (eight) hours as needed for nausea or vomiting., Disp: 60 tablet, Rfl: 0 .  oxyCODONE-acetaminophen (PERCOCET/ROXICET) 5-325 MG tablet, Take 2 tablets by mouth every 4 (four) hours as needed for severe pain. (Patient not taking: Reported on 12/30/2017), Disp: 15 tablet, Rfl: 0     Review of Systems  Constitutional: Positive for appetite change, diaphoresis and fatigue. Negative for activity change, chills, fever and unexpected weight change.  HENT: Positive for sore throat. Negative for congestion, rhinorrhea, sinus pressure, sneezing and trouble swallowing.   Eyes: Negative for photophobia and visual disturbance.  Respiratory: Negative for cough, chest tightness, shortness of breath, wheezing and stridor.   Cardiovascular: Negative for chest pain, palpitations and leg swelling.  Gastrointestinal: Positive for nausea and vomiting. Negative for abdominal distention, abdominal pain, anal bleeding, blood in stool, constipation and  diarrhea.  Genitourinary: Negative for difficulty urinating, dysuria, flank pain and hematuria.  Musculoskeletal: Negative for arthralgias, back pain, gait problem and joint swelling.  Skin: Positive for rash. Negative for color change, pallor and wound.  Neurological: Negative for dizziness, tremors, weakness and light-headedness.  Hematological: Negative for adenopathy. Does not bruise/bleed easily.  Psychiatric/Behavioral: Negative for agitation, behavioral problems, confusion, decreased concentration, dysphoric mood and sleep disturbance.       Objective:   Physical Exam  Constitutional: He is oriented to person, place, and time. He appears well-developed and well-nourished. No distress.  HENT:  Head: Normocephalic and atraumatic.  Mouth/Throat: Posterior oropharyngeal erythema present. No oropharyngeal exudate or posterior oropharyngeal edema.  Eyes: Conjunctivae and EOM are normal. No scleral icterus.  Neck: Normal range of motion. Neck supple. No JVD present.  Cardiovascular: Normal rate and regular rhythm.  Pulmonary/Chest: Effort normal and breath sounds normal. No respiratory distress.  Abdominal: Soft. He exhibits no distension. There is tenderness in the epigastric area. There is no guarding.  Musculoskeletal: He exhibits edema. He exhibits no tenderness.  Lymphadenopathy:    He has no cervical adenopathy.  Neurological: He is alert and oriented to person, place, and time. He exhibits normal muscle tone. Coordination normal.  Skin: Skin is warm and dry. He is not diaphoretic. No erythema. No pallor.  Psychiatric: He has a normal mood and affect. His behavior is normal. Judgment and thought content normal.  Nursing note and vitals reviewed.         Assessment & Plan:  #1: Sore throat with sinus congestion and vomiting: This sounds like a viral infection.  I will prescribe him Zofran I will check a conference of metabolic panel I will also screen him for oral gonorrhea.   Does not have an exudative pharyngitis so he definitely does not have streptococcal pharyngitis.  #2HIV: Well controlled on BIKTARVY  #2 Syphilis: NON-REACTIVE (12/17 1046)   #3.diabetes mellitus on metformin  #4 deep venous thrombosis on warfarin  #5 Atopic dermatitis: Being followed at [facility]

## 2018-03-11 ENCOUNTER — Encounter: Admit: 2018-03-11 | Discharge: 2018-03-11 | Payer: MEDICARE

## 2018-03-11 ENCOUNTER — Telehealth: Payer: Self-pay | Admitting: Behavioral Health

## 2018-03-11 DIAGNOSIS — R05 Cough: Principal | ICD-10-CM

## 2018-03-11 DIAGNOSIS — I8290 Acute embolism and thrombosis of unspecified vein: Principal | ICD-10-CM

## 2018-03-11 LAB — CYTOLOGY, (ORAL, ANAL, URETHRAL) ANCILLARY ONLY
Chlamydia: NEGATIVE
NEISSERIA GONORRHEA: POSITIVE — AB

## 2018-03-11 MED ORDER — LEVOFLOXACIN 750 MG TABLET
ORAL_TABLET | Freq: Every day | ORAL | 0 refills | 0.00000 days | Status: CP
Start: 2018-03-11 — End: 2018-04-14

## 2018-03-11 NOTE — Unmapped (Addendum)
Patient: Ryan Davenport    Medical Record Number:  [id number]    INR Results:  3.9    Steady State:  yes  Current Dose:  7.5 mg daily  Date INR: 03/11/2018  Goal INR: 2-3    Lab Results   Component Value Date    INR 3.9 03/11/2018    INR 3.0 02/05/2018    INR 2.3 01/15/2018    INR 2.60 05/28/2017    INR 2.7 03/30/2015    INR 2.1 02/23/2015       ASSESSMENT/PLAN:Plan for Warfarin Dose:  hold warfarin today, then 5 mg W Sat 7.5 mg other days    1. Cough    2. VTE (venous thromboembolism)        - Supratherapeutic INR 3.9, at [facility].  Unclear direct cause.  Has had a URI since February, symptoms about the same, cough, drainage and congestion.  No current fever.  Has lost weight per patient and appetite low/normal.  Hold and reduce as above given weight reduction and appetite suppression.   - Cough/left chest wall discomfort:  Chest wall pain consistent with costochondritis, improving.  OTC analgesic ok such as acetaminopehn.  Will proceed with chest xray given longevity of URI symptoms and immunosuppression with HIV disease.    Addendum:  Chest xray with LLL pneumonia, given immunocompromised, Levaquin x 5 days likely best choice. Dose reducing warfarin:  Hold x 2 days, 5 mg Thurs Sat 7.5 mg other days.  F/u next week to be set up, admin contacted.     Repeat INR Date:  2 weeks    ----------------------------------------------------------------------------------------------------------------------    CC:  Ho DVT/PE    PRIMARY CARE PHYSICIAN: Santa Genera, MD    PMHx:   Patient Active Problem List   Diagnosis   ??? 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 Allergy to latex   ??? Asthma, chronic (RAF-HCC)   ??? Chronic cardiopulmonary disease (CMS-HCC)   ??? Chronic diastolic heart failure (CMS-HCC)   ??? Cytomegaloviral disease (CMS-HCC)   ??? Dermatitis   ??? Dysplasia of anus   ??? Enthesopathy of knee   ??? Esophageal reflux   ??? Gout   ??? Horseshoe tear of retina   ??? Human immunodeficiency virus (HIV) disease (CMS-HCC)   ??? Essential hypertension   ??? Molluscum contagiosum   ??? Mononeuritis   ??? 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 infarction   ??? Convulsions (CMS-HCC)   ??? Hereditary and idiopathic peripheral neuropathy   ??? Inguinal hernia   ??? Early syphilis, secondary syphilis   ??? Coronary atherosclerosis (RAF-HCC)   ??? Acid reflux (RAF-HCC)   ??? Chronic eczema   ??? Depressive disorder, not elsewhere classified   ??? Allergic rhinitis   ??? Diabetes mellitus, controlled (CMS-HCC)   ??? Diastasis recti   ??? AIDS (CMS-HCC)   ??? Obesity (BMI 30.0-34.9)   ??? Disc disease, degenerative, lumbar or lumbosacral       Primary Warfarin Indication:  deep vein thrombosis and pulmonary embolism  Secondary Indication:    Signs/Symptoms Bleeding:   Nose Bleeds:no Gum Bleeds:no Change in Urine       Color:no Change in Stool Color:no    Bright Red Blood    per Rectum:no  Abnormal Bruising: no      Signs/Symptoms of Potential Embolic Events: no    Changes In:      Diet:no  Medications:no  Activities / Lifestyle: no  Health Status:no  Current EtOH Use:no   Current Smoker: no    Additional  History:  Still having cough (URI symptoms noted in Feb coag note).  No worse or improved.  Coughs up sputum sometimes.  Having right-sided chest discomfort with coughing, feels sore to touch and to cough.  No fever, chills, sob.  Saw ID yesterday.  Exam:  Well appearing in NAD  Lungs:  CTA bl  Chest:  Heart without murmur or rub, regular rhythm, right sided chest wall discomfort with palpation.  No erythema, mass.    Medications reviewed including OTC and herbals:yes    The patient set a personal goal for anticoagulation management today:Take warfarin daily as recommended.    Laural Benes, PA-C    Supervising physician:  Gabrielle Dare, MD

## 2018-03-11 NOTE — Unmapped (Signed)
Addended by: Laural Benes A on: 03/11/2018 04:30 PM     Modules accepted: Orders

## 2018-03-11 NOTE — Unmapped (Signed)
Left message as permitted by patient - xray with LLL pneumonia likely.  Given immunosuppressed state and pen allergic, will treat with Levaquin 750 mg x 5 days.  Will need to reduce warfarin further    Warfarin plan:  Hold today and tomorrow  Then 5 mg Thursdays and Saturdays, 7.5 mg other days  Levaquin x 5 days for pneumonia.   Will need INR recheck next week, will have schedulers set up    Gave clinic call back # as well as my cell phone so we can review again, he does not use mychart

## 2018-03-11 NOTE — Telephone Encounter (Signed)
Called patient, verified identity and informed him that he tested positive for Gonorrhea and needs to be treated with IM Ceftriaxone and oral azithromycin.  Nurse visit scheduled for 03/12/2018 at 9:00am.  Also informed him to contact partner x 60 days prior.  Patient verbalized understanding.   Angeline SlimAshley Hill RN

## 2018-03-11 NOTE — Telephone Encounter (Signed)
-----   Message from Randall Hissornelius N Van Dam, MD sent at 03/11/2018  4:36 PM EDT ----- Pt needs IM Ceftriaxone and oral azithromycin for GC and partners need to be tested, treated x 60 days prior

## 2018-03-12 ENCOUNTER — Ambulatory Visit (INDEPENDENT_AMBULATORY_CARE_PROVIDER_SITE_OTHER): Payer: Medicare Other | Admitting: *Deleted

## 2018-03-12 DIAGNOSIS — A549 Gonococcal infection, unspecified: Secondary | ICD-10-CM | POA: Diagnosis not present

## 2018-03-12 MED ORDER — AZITHROMYCIN 250 MG PO TABS
1000.0000 mg | ORAL_TABLET | Freq: Once | ORAL | Status: AC
  Administered 2018-03-12: 1000 mg via ORAL

## 2018-03-12 MED ORDER — CEFTRIAXONE SODIUM 250 MG IJ SOLR
250.0000 mg | Freq: Once | INTRAMUSCULAR | Status: AC
  Administered 2018-03-12: 250 mg via INTRAMUSCULAR

## 2018-03-13 ENCOUNTER — Encounter: Payer: Self-pay | Admitting: Behavioral Health

## 2018-03-13 NOTE — Telephone Encounter (Signed)
error 

## 2018-03-20 ENCOUNTER — Ambulatory Visit: Admit: 2018-03-20 | Discharge: 2018-03-21 | Payer: MEDICARE

## 2018-03-20 DIAGNOSIS — I8290 Acute embolism and thrombosis of unspecified vein: Principal | ICD-10-CM

## 2018-03-20 NOTE — Unmapped (Signed)
Your INR:  2.2    This is at goal    Plan for your warfarin:  Resume your usual warfarin dose 7.5 mg daily    Please be consistent with your green vegetables and take your warfarin daily as recommended.  Always contact [facility] before starting any new medications, including over-the-counter meds and supplements.

## 2018-03-20 NOTE — Unmapped (Signed)
[facility]  [facility] [place]  [address]  [place] [address]  [phone]    Assessment/Plan:     1. VTE (venous thromboembolism)    2.  Community Acquired Pneumonia    Anticoagulation is at steady state:  NO  Current warfarin dose:  Held x 2, then 5 mg x 1, then resumed 7.5 mg daily    INR Goal: 2-3     Lab Results   Component Value Date    INR 2.2 03/21/2018    INR 3.9 03/11/2018    INR 3.0 02/05/2018    INR 2.60 05/28/2017    INR 2.7 03/30/2015    INR 2.1 02/23/2015     VTE, on chronic anticoagulation  -INR today: therapeutic after held and decreased doses due to concommitant abx use  -Plan for Warfarin Dose:  resume 7.5 mg daily  -Repeat INR Date:  2 weeks  -The patient set a personal goal for anticoagulation management today:Keep green leafy vegetables consistent    CAP  -Improving, completed course of abx  -call clinic if worsening symptoms, fever, chills, sweats, dsypnea  -may consider repeat CXR for clearance in 2-3 weeks    Insomnia  -new complaint  -Sleep hygiene - no phone in bed  -melatonin 3 mg nightly, can increase to as much as 10 mg nightly if needed      Subject/Objective:     Patient: Ryan Davenport    Medical Record Number:  [id number]    PRIMARY CARE PHYSICIAN: Santa Genera, MD    UX:[id number] VTE and pneumonia   Secondary Indication:  none    Patient Active Problem List   Diagnosis   ??? 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 Allergy to latex   ??? Asthma, chronic (RAF-HCC)   ??? Chronic cardiopulmonary disease (CMS-HCC)   ??? Chronic diastolic heart failure (CMS-HCC)   ??? Cytomegaloviral disease (CMS-HCC)   ??? Dermatitis   ??? Dysplasia of anus   ??? Enthesopathy of knee   ??? Esophageal reflux   ??? Gout   ??? Horseshoe tear of retina   ??? Human immunodeficiency virus (HIV) disease (CMS-HCC)   ??? Essential hypertension   ??? Molluscum contagiosum   ??? Mononeuritis   ??? 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 infarction   ??? Convulsions (CMS-HCC)   ??? Hereditary and idiopathic peripheral neuropathy   ??? Inguinal hernia   ??? Early syphilis, secondary syphilis   ??? Coronary atherosclerosis (RAF-HCC)   ??? Acid reflux (RAF-HCC)   ??? Chronic eczema   ??? Depressive disorder, not elsewhere classified   ??? Allergic rhinitis   ??? Diabetes mellitus, controlled (CMS-HCC)   ??? Diastasis recti   ??? AIDS (CMS-HCC)   ??? Obesity (BMI 30.0-34.9)   ??? Disc disease, degenerative, lumbar or lumbosacral       Current Outpatient Prescriptions on File Prior to Visit   Medication Sig Dispense Refill   ??? atorvastatin (LIPITOR) 40 MG tablet Take 1 tablet (40 mg total) by mouth daily. 90 tablet 3   ??? betamethasone dipropionate (DIPROLENE) 0.05 % ointment Apply topically Two (2) times a day. 90 g 2   ??? BIKTARVY 50-200-25 mg tablet Take 1 tablet by mouth daily at 0600.  11   ??? citalopram (CELEXA) 40 MG tablet Take 1 tablet (40 mg total) by mouth daily. 90 tablet 3   ??? dupilumab (DUPIXENT) 300 mg/2 mL Syrg injection Inject 600mg  (4ml) subcutaneously on day 0, then 300mg  subcutaneously every 14 days. 6  Syringe 3   ??? fluticasone (FLONASE) 50 mcg/actuation nasal spray USE 1 SPRAY IEN D     ??? hydrOXYzine (ATARAX) 25 MG tablet Take 1 tablet (25 mg total) by mouth nightly as needed for itching. 90 tablet 1   ??? lisinopril (PRINIVIL,ZESTRIL) 2.5 MG tablet TAKE 1 TABLET BY MOUTH EVERY DAY 90 tablet 1   ??? metFORMIN (GLUCOPHAGE-XR) 500 MG 24 hr tablet Take 2 tablets (1,000 mg total) by mouth every morning before breakfast. 180 tablet 3   ??? triamcinolone (KENALOG) 0.1 % ointment Apply twice a day to affected areas 454 g 6   ??? warfarin (COUMADIN) 5 MG tablet 7.5mg  (1.5 tabs) daily or as directed 60 tablet prn   ??? [DISCONTINUED] colchicine 0.6 mg tablet Take 1 tablet (0.6 mg total) by mouth Two (2) times a day prn gout 60 tablet 5   ??? alcohol swabs PadM Use to check blood sugars once daily. ICD-10 E11.9 (Type 2 DM). Dispense insurance approved 100 each 3   ??? blood sugar diagnostic (ACCU-CHEK AVIVA PLUS TEST STRP) Strp by Other route once daily. ICD-10 E11.9 100 strip 3   ??? blood-glucose meter kit Use to check blood sugars once daily. ICD-10 E11.9 (Type 2 DM). Dispense insurance approved 1 each 0   ??? brinzolamide-brimonidine (SIMBRINZA) 1-0.2 % DrpS Administer 1 drop to the right eye Three (3) times a day. 8 mL 0   ??? glipiZIDE (GLUCOTROL XL) 2.5 MG 24 hr tablet TAKE 1 TABLET BY MOUTH DAILY 90 tablet 3   ??? oxyCODONE-acetaminophen (PERCOCET) 5-325 mg per tablet Take 1 tablet by mouth every four (4) hours as needed for pain. (Patient not taking: Reported on 03/20/2018) 30 tablet 0   ??? prednisoLONE acetate (PRED FORTE) 1 % ophthalmic suspension Administer 1 drop to the right eye Three (3) times a day. For 1 week. Then 2x/day for 1 week. Then 1x/day for 1 week. 10 mL 0   ??? triamcinolone (KENALOG) 0.1 % cream Apply twice a day to affected areas (Patient not taking: Reported on 03/20/2018) 454 g 6     No current facility-administered medications on file prior to visit.        Current Outpatient Prescriptions   Medication Sig Dispense Refill   ??? atorvastatin (LIPITOR) 40 MG tablet Take 1 tablet (40 mg total) by mouth daily. 90 tablet 3   ??? betamethasone dipropionate (DIPROLENE) 0.05 % ointment Apply topically Two (2) times a day. 90 g 2   ??? BIKTARVY 50-200-25 mg tablet Take 1 tablet by mouth daily at 0600.  11   ??? citalopram (CELEXA) 40 MG tablet Take 1 tablet (40 mg total) by mouth daily. 90 tablet 3   ??? dupilumab (DUPIXENT) 300 mg/2 mL Syrg injection Inject 600mg  (4ml) subcutaneously on day 0, then 300mg  subcutaneously every 14 days. 6 Syringe 3   ??? fluticasone (FLONASE) 50 mcg/actuation nasal spray USE 1 SPRAY IEN D     ??? hydrOXYzine (ATARAX) 25 MG tablet Take 1 tablet (25 mg total) by mouth nightly as needed for itching. 90 tablet 1   ??? lisinopril (PRINIVIL,ZESTRIL) 2.5 MG tablet TAKE 1 TABLET BY MOUTH EVERY DAY 90 tablet 1   ??? metFORMIN (GLUCOPHAGE-XR) 500 MG 24 hr tablet Take 2 tablets (1,000 mg total) by mouth every morning before breakfast. 180 tablet 3   ??? triamcinolone (KENALOG) 0.1 % ointment Apply twice a day to affected areas 454 g 6   ??? warfarin (COUMADIN) 5 MG tablet 7.5mg  (1.5 tabs) daily or as  directed 60 tablet prn   ??? alcohol swabs PadM Use to check blood sugars once daily. ICD-10 E11.9 (Type 2 DM). Dispense insurance approved 100 each 3   ??? blood sugar diagnostic (ACCU-CHEK AVIVA PLUS TEST STRP) Strp by Other route once daily. ICD-10 E11.9 100 strip 3   ??? blood-glucose meter kit Use to check blood sugars once daily. ICD-10 E11.9 (Type 2 DM). Dispense insurance approved 1 each 0   ??? brinzolamide-brimonidine (SIMBRINZA) 1-0.2 % DrpS Administer 1 drop to the right eye Three (3) times a day. 8 mL 0   ??? glipiZIDE (GLUCOTROL XL) 2.5 MG 24 hr tablet TAKE 1 TABLET BY MOUTH DAILY 90 tablet 3   ??? oxyCODONE-acetaminophen (PERCOCET) 5-325 mg per tablet Take 1 tablet by mouth every four (4) hours as needed for pain. (Patient not taking: Reported on 03/20/2018) 30 tablet 0   ??? prednisoLONE acetate (PRED FORTE) 1 % ophthalmic suspension Administer 1 drop to the right eye Three (3) times a day. For 1 week. Then 2x/day for 1 week. Then 1x/day for 1 week. 10 mL 0   ??? triamcinolone (KENALOG) 0.1 % cream Apply twice a day to affected areas (Patient not taking: Reported on 03/20/2018) 454 g 6     No current facility-administered medications for this visit.        Review of Systems:  Signs/Symptoms of Potential Embolic Events:   Headache, dizziness, weakness:  no  Unilateral Edema:   no  Chest pain, shortness of breath:   no    Signs/Symptoms of Bleeding:  Blood in urine:   no  Blood in stool or dark, tarry stools:   no  Nose bleeds or gum bleeds:  no  Unexplained bruises:   no    Changes in Medications:   yes    Consistency in vitamin K intake:   no    Missed doses of warfarin:   no    Additional History: completed 5-day course of levaquin for CAP, now resumed usual dose of warfarin    Physical Examination:   General:  well appearing in no acute distress   CV:  S1S2, RRR, no MRG  Lungs:  LLL rhales, no consolidation     Medications reviewed including OTC and herbals:yes

## 2018-03-26 NOTE — Unmapped (Signed)
Patient was diagnosed with Pneumonia. Received antibiotics for 5 days and is currently off antibiotics and doing well. Did not miss a dose during this time.     [facility] Refill Coordination Note  Specialty Medication(s): DUPIXENT  Additional Medications shipped: N/A    Ryan Davenport, DOB: Sep 10, 1961  Phone: [phone] (home) , Alternate phone contact: N/A  Phone or address changes today?: No  All above HIPAA information was verified with patient.  Shipping Address: [address]  [address] [name] [address]   Insurance changes? No    Completed refill call assessment today to schedule patient's medication shipment from the [facility] [phone]).      Confirmed the medication and dosage are correct and have not changed: Yes, regimen is correct and unchanged.    Confirmed patient started or stopped the following medications in the past month:  No, there are no changes reported at this time.    Are you tolerating your medication?:  [name] reports tolerating the medication.    ADHERENCE    Patient due for next dose on 4/4. Has one on hand for that. Sending out new box on 4/9 to be used on 4/18.    Did you miss any doses in the past 4 weeks? No missed doses reported.    FINANCIAL/SHIPPING    Delivery Scheduled: Yes, Expected medication delivery date: 04/01/18     The patient will receive an FSI print out for each medication shipped and additional FDA Medication Guides as required.  Patient education from [place] or [name] may also be included in the shipment    [name] did not have any additional questions at this time.    Delivery address validated in FSI scheduling system: Yes, address listed in FSI is correct.    We will follow up with patient monthly for standard refill processing and delivery.      Thank you,  [name]   [facility] Specialty Technician

## 2018-03-27 ENCOUNTER — Encounter: Admit: 2018-03-27 | Discharge: 2018-03-28 | Payer: MEDICARE | Attending: Ophthalmology | Primary: Ophthalmology

## 2018-03-27 DIAGNOSIS — H269 Unspecified cataract: Secondary | ICD-10-CM

## 2018-03-27 DIAGNOSIS — H40003 Preglaucoma, unspecified, bilateral: Principal | ICD-10-CM

## 2018-03-27 NOTE — Unmapped (Signed)
58 yr old male with h/o HIV/Syphilis/CMV (Follows with Dr. Daiva Eves infectious disease in [place]) & iritis, OD and is treated for eczema with  Dupilumab.        1. 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  The left eye is normal. IOP was elevated on presentation in February. Likely Steroid response. IOP 9 today with Simbrinza TID. AC now quiet and patient denies pain. Stop Simbrinza and Prednisolone acetate    Positive FTA-ABS/RPR in 2011 &  2017 t 2/p treatment with RPR non-reactive 20/2018     01/2018 CMP, CBC, ANA, RF unrevealing    CD4 on 02/21/18 was 550. Viral load undetectable on 11/2017 Currently on HAART.    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 H/o CMV Retinitis     4. H/o HST OD    5. Lattice Degeneration OU  - S/p Laser Prophylaxis 12/2004    6. ERM OD    RTC 6 months

## 2018-03-31 MED FILL — DUPIXENT (ML)/300MG/2ML/SOSY: DUPIXENT (ML)/300MG/2ML/SOSY | 28 days supply | Qty: 4 | Fill #2

## 2018-04-03 NOTE — Unmapped (Signed)
error 

## 2018-04-11 MED ORDER — BRINZOLAMIDE 1 %-BRIMONIDINE 0.2 % EYE DROPS,SUSPENSION
Freq: Three times a day (TID) | OPHTHALMIC | 0 refills | 0.00000 days | Status: CP
Start: 2018-04-11 — End: 2018-06-17

## 2018-04-14 ENCOUNTER — Encounter: Admit: 2018-04-14 | Discharge: 2018-04-14 | Payer: MEDICARE

## 2018-04-14 DIAGNOSIS — Z8701 Personal history of pneumonia (recurrent): Principal | ICD-10-CM

## 2018-04-14 DIAGNOSIS — Z86711 Personal history of pulmonary embolism: Secondary | ICD-10-CM

## 2018-04-14 DIAGNOSIS — L2081 Atopic neurodermatitis: Secondary | ICD-10-CM

## 2018-04-14 DIAGNOSIS — L209 Atopic dermatitis, unspecified: Secondary | ICD-10-CM

## 2018-04-14 MED ORDER — TRIAMCINOLONE ACETONIDE 0.1 % TOPICAL OINTMENT
6 refills | 0 days | Status: CP
Start: 2018-04-14 — End: 2019-03-11

## 2018-04-14 MED ORDER — GLIPIZIDE ER 2.5 MG TABLET, EXTENDED RELEASE 24 HR
ORAL_TABLET | Freq: Every day | ORAL | 3 refills | 0.00000 days | Status: CP
Start: 2018-04-14 — End: 2019-01-01

## 2018-04-14 MED ORDER — HYDROXYZINE HCL 25 MG TABLET
ORAL_TABLET | Freq: Every evening | ORAL | 3 refills | 0.00000 days | Status: CP | PRN
Start: 2018-04-14 — End: 2018-10-16

## 2018-04-14 NOTE — Unmapped (Addendum)
Assessment/Plan:     Current warfarin Dose:  7.5mg  daily  Steady State:yes  Date INR: April 14, 2018  Goal INR: 2-3  INR Results: 2.3  Lab Results   Component Value Date    INR 2.3 04/14/2018    INR 2.2 03/21/2018    INR 3.9 03/11/2018    INR 2.60 05/28/2017    INR 2.7 03/30/2015    INR 2.1 02/23/2015       ASSESSMENT/PLAN:  Plan for Warfarin Dose:  continue current dose    1. INR therapeutic  2. PNA - repeat CXR to assure resolution in this compromised patient  3. Atopic dermatitis - refills for hydroxyzine and triamcin cream provided    **CXR improving    Repeat INR Date:  4 wks    Subject/Objective:     Patient: Ryan Davenport 57 y.o.    Medical Record Number:  [id number]    PRIMARY CARE PHYSICIAN: Santa Genera, MD    HY:QMVHQION VTE, PNA    PMHx:   Patient Active Problem List   Diagnosis   ??? 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 Allergy to latex   ??? Asthma, chronic (RAF-HCC)   ??? Chronic cardiopulmonary disease (CMS-HCC)   ??? Chronic diastolic heart failure (CMS-HCC)   ??? Cytomegaloviral disease (CMS-HCC)   ??? Dermatitis   ??? Dysplasia of anus   ??? Enthesopathy of knee   ??? Esophageal reflux   ??? Gout   ??? Horseshoe tear of retina   ??? Human immunodeficiency virus (HIV) disease (CMS-HCC)   ??? Essential hypertension   ??? Molluscum contagiosum   ??? Mononeuritis   ??? 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 infarction   ??? Convulsions (CMS-HCC)   ??? Hereditary and idiopathic peripheral neuropathy   ??? Inguinal hernia   ??? Early syphilis, secondary syphilis   ??? Coronary atherosclerosis (RAF-HCC)   ??? Acid reflux (RAF-HCC)   ??? Chronic eczema   ??? Depressive disorder, not elsewhere classified   ??? Allergic rhinitis   ??? Diabetes mellitus, controlled (CMS-HCC)   ??? Diastasis recti   ??? AIDS (CMS-HCC)   ??? Obesity (BMI 30.0-34.9)   ??? Disc disease, degenerative, lumbar or lumbosacral          Your Medication List           Accurate as of 04/14/18  3:46 PM. If you have any questions, ask your nurse or doctor.               STOP taking these medications    levoFLOXacin 750 MG tablet  Commonly known as:  LEVAQUIN        CONTINUE taking these medications    alcohol swabs Padm  Use to check blood sugars once daily. ICD-10 E11.9 (Type 2 DM). Dispense insurance approved     atorvastatin 40 MG tablet  Commonly known as:  LIPITOR  Take 1 tablet (40 mg total) by mouth daily.     betamethasone dipropionate 0.05 % ointment  Commonly known as:  DIPROLENE  Apply topically Two (2) times a day.     BIKTARVY 50-200-25 mg tablet  Generic drug:  bictegrav-emtricit-tenofov ala  Take 1 tablet by mouth daily at 0600.     blood sugar diagnostic Strp  Commonly known as:  ACCU-CHEK AVIVA PLUS TEST STRP  by Other route once daily. ICD-10 E11.9     blood-glucose meter kit  Use to check blood sugars once daily. ICD-10 E11.9 (Type 2 DM). Dispense insurance approved  brinzolamide-brimonidine 1-0.2 % Drps  Commonly known as:  SIMBRINZA  Administer 1 drop to the right eye Three (3) times a day.     citalopram 40 MG tablet  Commonly known as:  CeleXA  Take 1 tablet (40 mg total) by mouth daily.     dupilumab 300 mg/2 mL Syrg injection  Commonly known as:  DUPIXENT  Inject 600mg  (4ml) subcutaneously on day 0, then 300mg  subcutaneously every 14 days.     fluticasone propionate 50 mcg/actuation nasal spray  Commonly known as:  FLONASE  USE 1 SPRAY IEN D     glipiZIDE 2.5 MG 24 hr tablet  Commonly known as:  GLUCOTROL XL  Take 1 tablet (2.5 mg total) by mouth daily.     hydrOXYzine 25 MG tablet  Commonly known as:  ATARAX  Take 1 tablet (25 mg total) by mouth nightly as needed for itching.     lisinopril 2.5 MG tablet  Commonly known as:  PRINIVIL,ZESTRIL  TAKE 1 TABLET BY MOUTH EVERY DAY     metFORMIN 500 MG 24 hr tablet  Commonly known as:  GLUCOPHAGE-XR  Take 2 tablets (1,000 mg total) by mouth every morning before breakfast.     oxyCODONE-acetaminophen 5-325 mg per tablet  Commonly known as:  PERCOCET  Take 1 tablet by mouth every four (4) hours as needed for pain. prednisoLONE acetate 1 % ophthalmic suspension  Commonly known as:  PRED FORTE  Administer 1 drop to the right eye Three (3) times a day. For 1 week. Then 2x/day for 1 week. Then 1x/day for 1 week.     triamcinolone 0.1 % cream  Commonly known as:  KENALOG  Apply twice a day to affected areas     triamcinolone 0.1 % ointment  Commonly known as:  KENALOG  Apply twice a day to affected areas     warfarin 5 MG tablet  Commonly known as:  COUMADIN  7.5mg  (1.5 tabs) daily or as directed              General:  Well appearing, no acute distress    Signs/Symptoms bleeding or bruising: no    Signs/Symptoms of Potential Embolic Events: no    Changes In:   Diet:no   Medications:no   Activities / Lifestyle:no   Health Status:no   Nonadherent: no       Current EtOH Use:no   Current Smoker/tobacco user: no    Medications reviewed including OTC and herbals:yes    The patient set a personal goal for anticoagulation management today:Take warfarin daily as recommended.    Additional History: see previous notes regarding PNA and Levaquin.  Feeling much better

## 2018-04-28 MED FILL — DUPIXENT (ML)/300MG/2ML/SOSY: DUPIXENT (ML)/300MG/2ML/SOSY | 28 days supply | Qty: 4 | Fill #3

## 2018-04-28 NOTE — Unmapped (Signed)
[facility] Refill Coordination Note    Specialty Medication(s) to be Shipped:   Inflammatory Disorders: Dupixent    Other medication(s) to be shipped: N/A     Ryan Davenport, DOB: [birth year]  Phone: [phone] (home)   Shipping Address: [address]  [address] Ryan [place]en[place] [address]    All above HIPAA information was verified with patient.     Completed refill call assessment today to schedule patient's medication shipment from the [facility] [phone]).       Specialty medication(s) and dose(s) confirmed: Regimen is correct and unchanged.   Changes to medications: Ryan Davenport reports no changes reported at this time.  Changes to insurance: No  Questions for the pharmacist: No    The patient will receive an FSI print out for each medication shipped and additional FDA Medication Guides as required.  Patient education from [place] or Ryan Davenport may also be included in the shipment.    DISEASE-SPECIFIC INFORMATION        For Inflammatory disorders patients on injectable medications: Patient currently has 0 doses left.  Next injection is scheduled for 5/9 OR 5/16 she said she took it last thursday then said no the thursday before then said she'd check...not sure.    ADHERENCE     Medication Adherence    Patient reported X missed doses in the last month:  0  Specialty Medication:  DUPIXENT  Patient is on additional specialty medications:  No  Patient is on more than two specialty medications:  No  Any gaps in refill history greater than 2 weeks in the last 3 months:  no  Demonstrates understanding of importance of adherence:  yes  Informant:  patient  Confirmed plan for next specialty medication refill:  delivery by pharmacy  Refills needed for supportive medications:  not needed          Refill Coordination    Has the Patients' Contact Information Changed:  No  Is the Shipping Address Different:  No           SHIPPING     Shipping address confirmed in FSI.     Delivery Scheduled: Yes, Expected medication delivery date: 04/30/18 via UPS or courier.     [name]   [facility] Specialty Technician

## 2018-05-12 ENCOUNTER — Encounter: Admit: 2018-05-12 | Discharge: 2018-05-13 | Payer: MEDICARE

## 2018-05-12 DIAGNOSIS — I8290 Acute embolism and thrombosis of unspecified vein: Principal | ICD-10-CM

## 2018-05-12 DIAGNOSIS — I2699 Other pulmonary embolism without acute cor pulmonale: Secondary | ICD-10-CM

## 2018-05-12 NOTE — Unmapped (Signed)
Your INR:  3.3    This is a little too high    Plan for your warfarin:  5 mg Monday, 7.5 mg others    Please be consistent with your green vegetables and take your warfarin daily as recommended.  Always contact [facility] before starting any new medications, including over-the-counter meds and supplements.

## 2018-05-13 NOTE — Unmapped (Signed)
[facility]  [facility] INTER[facility] [place]  [address]  [place] [address]  [phone]    Assessment/Plan:     1. VTE (venous thromboembolism)    2. Other pulmonary embolism without acute cor pulmonale, unspecified chronicity (CMS-HCC)      ~Anticoagulation is at steady state:  yes  ~Current warfarin dose:  7.5 mg daily  INR Goal: 2.0-3.0     Lab Results   Component Value Date    INR 3.3 05/12/2018    INR 2.3 04/14/2018    INR 2.2 03/21/2018    INR 2.60 05/28/2017    INR 2.7 03/30/2015    INR 2.1 02/23/2015     ~INR today: supratherapeutic, at upper limit of acceptable range. However he is feeling poorly with a poor appetite and is concerned that this dose may keep him too close to supratherapeutic range.   ~plan for Warfarin Dose:  decrease dose to 5 mg Monday, 7.5 mg others  ~Repeat INR Date:  2 weeks  ~The patient set a personal goal for anticoagulation management today:Keep green leafy vegetables consistent    Recent CAP, improving  ~Reassurance that his is improving per chest xray, continue to rest as he is able.  Report any fever, chills, sweat, dyspnea, or other concerning sx.     Subject/Objective:     Patient: Ryan Davenport    Medical Record Number:  [id number]    PRIMARY CARE PHYSICIAN: Santa Genera, MD    YQ:[id number] deep vein thrombosis   Secondary Indication:  PE    Patient Active Problem List   Diagnosis   ??? 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 Allergy to latex   ??? Asthma, chronic (RAF-HCC)   ??? Chronic cardiopulmonary disease (CMS-HCC)   ??? Chronic diastolic heart failure (CMS-HCC)   ??? Cytomegaloviral disease (CMS-HCC)   ??? Dermatitis   ??? Dysplasia of anus   ??? Enthesopathy of knee   ??? Esophageal reflux   ??? Gout   ??? Horseshoe tear of retina   ??? Human immunodeficiency virus (HIV) disease (CMS-HCC)   ??? Essential hypertension   ??? Molluscum contagiosum   ??? Mononeuritis   ??? 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 infarction   ??? Convulsions (CMS-HCC)   ??? Hereditary and idiopathic peripheral neuropathy   ??? Inguinal hernia   ??? Early syphilis, secondary syphilis   ??? Coronary atherosclerosis (RAF-HCC)   ??? Acid reflux (RAF-HCC)   ??? Chronic eczema   ??? Depressive disorder, not elsewhere classified   ??? Allergic rhinitis   ??? Diabetes mellitus, controlled (CMS-HCC)   ??? Diastasis recti   ??? AIDS (CMS-HCC)   ??? Obesity (BMI 30.0-34.9)   ??? Disc disease, degenerative, lumbar or lumbosacral       Current Outpatient Medications on File Prior to Visit   Medication Sig Dispense Refill   ??? alcohol swabs PadM Use to check blood sugars once daily. ICD-10 E11.9 (Type 2 DM). Dispense insurance approved 100 each 3   ??? atorvastatin (LIPITOR) 40 MG tablet Take 1 tablet (40 mg total) by mouth daily. 90 tablet 3   ??? betamethasone dipropionate (DIPROLENE) 0.05 % ointment Apply topically Two (2) times a day. 90 g 2   ??? BIKTARVY 50-200-25 mg tablet Take 1 tablet by mouth daily at 0600.  11   ??? blood sugar diagnostic (ACCU-CHEK AVIVA PLUS TEST STRP) Strp by Other route once daily. ICD-10 E11.9 100 strip 3   ??? blood-glucose meter kit Use to check blood sugars  once daily. ICD-10 E11.9 (Type 2 DM). Dispense insurance approved 1 each 0   ??? brinzolamide-brimonidine (SIMBRINZA) 1-0.2 % DrpS Administer 1 drop to the right eye Three (3) times a day. 8 mL 0   ??? citalopram (CELEXA) 40 MG tablet Take 1 tablet (40 mg total) by mouth daily. 90 tablet 3   ??? dupilumab (DUPIXENT) 300 mg/2 mL Syrg injection Inject 600mg  (4ml) subcutaneously on day 0, then 300mg  subcutaneously every 14 days. 6 Syringe 3   ??? fluticasone (FLONASE) 50 mcg/actuation nasal spray USE 1 SPRAY IEN D     ??? glipiZIDE (GLUCOTROL XL) 2.5 MG 24 hr tablet Take 1 tablet (2.5 mg total) by mouth daily. 90 tablet 3   ??? hydrOXYzine (ATARAX) 25 MG tablet Take 1 tablet (25 mg total) by mouth nightly as needed for itching. 90 tablet 3   ??? lisinopril (PRINIVIL,ZESTRIL) 2.5 MG tablet TAKE 1 TABLET BY MOUTH EVERY DAY 90 tablet 1   ??? metFORMIN (GLUCOPHAGE-XR) 500 MG 24 hr tablet Take 2 tablets (1,000 mg total) by mouth every morning before breakfast. 180 tablet 3   ??? oxyCODONE-acetaminophen (PERCOCET) 5-325 mg per tablet Take 1 tablet by mouth every four (4) hours as needed for pain. (Patient not taking: Reported on 03/20/2018) 30 tablet 0   ??? prednisoLONE acetate (PRED FORTE) 1 % ophthalmic suspension Administer 1 drop to the right eye Three (3) times a day. For 1 week. Then 2x/day for 1 week. Then 1x/day for 1 week. (Patient not taking: Reported on 03/27/2018) 10 mL 0   ??? triamcinolone (KENALOG) 0.1 % cream Apply twice a day to affected areas (Patient not taking: Reported on 03/20/2018) 454 g 6   ??? triamcinolone (KENALOG) 0.1 % ointment Apply twice a day to affected areas 454 g 6   ??? warfarin (COUMADIN) 5 MG tablet 7.5mg  (1.5 tabs) daily or as directed 60 tablet prn     No current facility-administered medications on file prior to visit.        Current Outpatient Medications   Medication Sig Dispense Refill   ??? alcohol swabs PadM Use to check blood sugars once daily. ICD-10 E11.9 (Type 2 DM). Dispense insurance approved 100 each 3   ??? atorvastatin (LIPITOR) 40 MG tablet Take 1 tablet (40 mg total) by mouth daily. 90 tablet 3   ??? betamethasone dipropionate (DIPROLENE) 0.05 % ointment Apply topically Two (2) times a day. 90 g 2   ??? BIKTARVY 50-200-25 mg tablet Take 1 tablet by mouth daily at 0600.  11   ??? blood sugar diagnostic (ACCU-CHEK AVIVA PLUS TEST STRP) Strp by Other route once daily. ICD-10 E11.9 100 strip 3   ??? blood-glucose meter kit Use to check blood sugars once daily. ICD-10 E11.9 (Type 2 DM). Dispense insurance approved 1 each 0   ??? brinzolamide-brimonidine (SIMBRINZA) 1-0.2 % DrpS Administer 1 drop to the right eye Three (3) times a day. 8 mL 0   ??? citalopram (CELEXA) 40 MG tablet Take 1 tablet (40 mg total) by mouth daily. 90 tablet 3   ??? dupilumab (DUPIXENT) 300 mg/2 mL Syrg injection Inject 600mg  (4ml) subcutaneously on day 0, then 300mg  subcutaneously every 14 days. 6 Syringe 3 ??? fluticasone (FLONASE) 50 mcg/actuation nasal spray USE 1 SPRAY IEN D     ??? glipiZIDE (GLUCOTROL XL) 2.5 MG 24 hr tablet Take 1 tablet (2.5 mg total) by mouth daily. 90 tablet 3   ??? hydrOXYzine (ATARAX) 25 MG tablet Take 1 tablet (25 mg total) by  mouth nightly as needed for itching. 90 tablet 3   ??? lisinopril (PRINIVIL,ZESTRIL) 2.5 MG tablet TAKE 1 TABLET BY MOUTH EVERY DAY 90 tablet 1   ??? metFORMIN (GLUCOPHAGE-XR) 500 MG 24 hr tablet Take 2 tablets (1,000 mg total) by mouth every morning before breakfast. 180 tablet 3   ??? oxyCODONE-acetaminophen (PERCOCET) 5-325 mg per tablet Take 1 tablet by mouth every four (4) hours as needed for pain. (Patient not taking: Reported on 03/20/2018) 30 tablet 0   ??? prednisoLONE acetate (PRED FORTE) 1 % ophthalmic suspension Administer 1 drop to the right eye Three (3) times a day. For 1 week. Then 2x/day for 1 week. Then 1x/day for 1 week. (Patient not taking: Reported on 03/27/2018) 10 mL 0   ??? triamcinolone (KENALOG) 0.1 % cream Apply twice a day to affected areas (Patient not taking: Reported on 03/20/2018) 454 g 6   ??? triamcinolone (KENALOG) 0.1 % ointment Apply twice a day to affected areas 454 g 6   ??? warfarin (COUMADIN) 5 MG tablet 7.5mg  (1.5 tabs) daily or as directed 60 tablet prn     No current facility-administered medications for this visit.        Medication Review:  Medications reviewed including OTC and herbals:yes    Review of Systems:  Signs/Symptoms of Potential Embolic Events:   Headache, dizziness, weakness:  no  Unilateral Edema:   no  Chest pain, shortness of breath:   no    Signs/Symptoms of Bleeding:  Blood in urine:   no  Blood in stool or dark, tarry stools:   no  Nose bleeds or gum bleeds:  no  Unexplained bruises:   no    Changes in Medications:   no    Consistency in vitamin K intake:   yes    Missed doses of warfarin:   no    Additional History: continues to feel poorly after CAP.  CXR after last visit showed improvement     Physical Examination: General:  well appearing in no acute distress

## 2018-05-22 ENCOUNTER — Other Ambulatory Visit: Payer: Self-pay

## 2018-05-22 ENCOUNTER — Emergency Department (HOSPITAL_COMMUNITY)
Admission: EM | Admit: 2018-05-22 | Discharge: 2018-05-22 | Disposition: A | Discharge status: Home / Self Care | Payer: Medicare Other | Attending: Emergency Medicine | Admitting: Emergency Medicine

## 2018-05-22 ENCOUNTER — Emergency Department (HOSPITAL_COMMUNITY): Payer: Medicare Other

## 2018-05-22 DIAGNOSIS — Z7984 Long term (current) use of oral hypoglycemic drugs: Secondary | ICD-10-CM | POA: Diagnosis not present

## 2018-05-22 DIAGNOSIS — I5032 Chronic diastolic (congestive) heart failure: Secondary | ICD-10-CM | POA: Diagnosis not present

## 2018-05-22 DIAGNOSIS — Z79899 Other long term (current) drug therapy: Secondary | ICD-10-CM | POA: Insufficient documentation

## 2018-05-22 DIAGNOSIS — Z7901 Long term (current) use of anticoagulants: Secondary | ICD-10-CM | POA: Diagnosis not present

## 2018-05-22 DIAGNOSIS — Z21 Asymptomatic human immunodeficiency virus [HIV] infection status: Secondary | ICD-10-CM | POA: Insufficient documentation

## 2018-05-22 DIAGNOSIS — R2231 Localized swelling, mass and lump, right upper limb: Secondary | ICD-10-CM | POA: Insufficient documentation

## 2018-05-22 DIAGNOSIS — M25531 Pain in right wrist: Secondary | ICD-10-CM | POA: Diagnosis present

## 2018-05-22 DIAGNOSIS — E119 Type 2 diabetes mellitus without complications: Secondary | ICD-10-CM | POA: Insufficient documentation

## 2018-05-22 DIAGNOSIS — I110 Hypertensive heart disease with heart failure: Secondary | ICD-10-CM | POA: Insufficient documentation

## 2018-05-22 DIAGNOSIS — M25431 Effusion, right wrist: Secondary | ICD-10-CM

## 2018-05-22 LAB — CBC WITH DIFFERENTIAL/PLATELET
Abs Immature Granulocytes: 0 10*3/uL (ref 0.0–0.1)
Basophils Absolute: 0 10*3/uL (ref 0.0–0.1)
Basophils Relative: 0 %
EOS ABS: 0.2 10*3/uL (ref 0.0–0.7)
Eosinophils Relative: 3 %
HEMATOCRIT: 44.6 % (ref 39.0–52.0)
HEMOGLOBIN: 14 g/dL (ref 13.0–17.0)
IMMATURE GRANULOCYTES: 0 %
LYMPHS ABS: 2.5 10*3/uL (ref 0.7–4.0)
LYMPHS PCT: 38 %
MCH: 27.6 pg (ref 26.0–34.0)
MCHC: 31.4 g/dL (ref 30.0–36.0)
MCV: 87.8 fL (ref 78.0–100.0)
MONOS PCT: 9 %
Monocytes Absolute: 0.6 10*3/uL (ref 0.1–1.0)
Neutro Abs: 3.2 10*3/uL (ref 1.7–7.7)
Neutrophils Relative %: 50 %
Platelets: 196 10*3/uL (ref 150–400)
RBC: 5.08 MIL/uL (ref 4.22–5.81)
RDW: 16.4 % — AB (ref 11.5–15.5)
WBC: 6.5 10*3/uL (ref 4.0–10.5)

## 2018-05-22 LAB — BASIC METABOLIC PANEL
ANION GAP: 8 (ref 5–15)
BUN: 8 mg/dL (ref 6–20)
CALCIUM: 9.1 mg/dL (ref 8.9–10.3)
CHLORIDE: 106 mmol/L (ref 101–111)
CO2: 27 mmol/L (ref 22–32)
Creatinine, Ser: 1.16 mg/dL (ref 0.61–1.24)
GFR calc Af Amer: >60 mL/min (ref >60)
GFR calc non Af Amer: >60 mL/min (ref >60)
GLUCOSE: 102 mg/dL — AB (ref 65–99)
POTASSIUM: 4.2 mmol/L (ref 3.5–5.1)
Sodium: 141 mmol/L (ref 135–145)

## 2018-05-22 LAB — PROTIME-INR
INR: 2.29
Prothrombin Time: 25.1 seconds — ABNORMAL HIGH (ref 11.4–15.2)

## 2018-05-22 LAB — CBG MONITORING, ED: GLUCOSE-CAPILLARY: 93 mg/dL (ref 65–99)

## 2018-05-22 LAB — URIC ACID: Uric Acid, Serum: 8.1 mg/dL — ABNORMAL HIGH (ref 4.4–7.6)

## 2018-05-22 MED ORDER — COLCHICINE 0.6 MG PO TABS: 0.6000 mg | ORAL_TABLET | Freq: Every day | ORAL | 0 refills | Status: DC

## 2018-05-22 MED ORDER — KETOROLAC TROMETHAMINE 30 MG/ML IJ SOLN
30.0000 mg | INTRAMUSCULAR | Status: AC
  Administered 2018-05-22: 30 mg via INTRAMUSCULAR
  Filled 2018-05-22: qty 1

## 2018-05-22 MED ORDER — LIDOCAINE HCL (PF) 1 % IJ SOLN
5.0000 mL | Freq: Once | INTRAMUSCULAR | Status: AC
Start: 2018-05-22 — End: 2018-05-22
  Administered 2018-05-22: 5 mL
  Filled 2018-05-22: qty 5

## 2018-05-22 MED ORDER — OXYCODONE-ACETAMINOPHEN 5-325 MG PO TABS
2.0000 | ORAL_TABLET | Freq: Once | ORAL | Status: AC
  Administered 2018-05-22: 2 via ORAL
  Filled 2018-05-22: qty 2

## 2018-05-22 MED ORDER — PREDNISONE 50 MG PO TABS: ORAL_TABLET | ORAL | 0 refills | Status: DC

## 2018-05-22 MED ORDER — KETOROLAC TROMETHAMINE 30 MG/ML IJ SOLN: 30.0000 mg | Freq: Once | INTRAMUSCULAR | Status: DC

## 2018-05-22 MED ORDER — DOXYCYCLINE HYCLATE 100 MG PO CAPS: 100.0000 mg | ORAL_CAPSULE | Freq: Two times a day (BID) | ORAL | 0 refills | Status: DC

## 2018-05-22 NOTE — Unmapped (Signed)
Spoke with Kagen's wife    [facility] Refill Coordination Note    Specialty Medication(s) to be Shipped:   Inflammatory Disorders: Dupixent    Other medication(s) to be shipped: n/a     Shanna Cisco, DOB: [birth year]  Phone: [phone] (home)   Shipping Address: [address]  [name] [address]    All above HIPAA information was verified with patient's wife     Completed refill call assessment today to schedule patient's medication shipment from the [facility] [phone]).       Specialty medication(s) and dose(s) confirmed: Regimen is correct and unchanged.   Changes to medications: Emilio reports no changes reported at this time.  Changes to insurance: No  Questions for the pharmacist: No    The patient will receive an [employer]SI print out for each medication shipped and additional FDA Medication Guides as required.  Patient education from [place] or [name] may also be included in the shipment.    DISEASE-SPECIFIC INFORMATION        For Inflammatory disorders patients on injectable medications: Patient currently has 1 doses left.  Next injection is scheduled for today, 05/22/18.    ADHERENCE     Medication Adherence    Patient reported X missed doses in the last month:  0  Specialty Medication:  dupixent  Patient is on additional specialty medications:  No  Patient is on more than two specialty medications:  No  Any gaps in refill history greater than 2 weeks in the last 3 months:  no  Demonstrates understanding of importance of adherence:  yes  Informant:  spouse  Reliability of informant:  reliable  Confirmed plan for next specialty medication refill:  delivery by pharmacy  Refills needed for supportive medications:  not needed          Refill Coordination    Has the Patients' Contact Information Changed:  No  Is the Shipping Address Different:  No         SHIPPING     Shipping address confirmed in FSI.     Delivery Scheduled: Yes, Expected medication delivery date: 05/28/18 via UPS or courier.     [name]   [facility] Specialty Technician

## 2018-05-22 NOTE — Unmapped (Signed)
Please call patient seen in ED and prescribed doxycycline interaction with warfarin,please advise [phone].Thank you

## 2018-05-22 NOTE — Unmapped (Signed)
Starting 10d doxycycline 05/22/18, has already taken warfarin 5/30.  Plan: skip warfarin 5/31 then resume current regimen, keep coag f/u 05/27/18

## 2018-05-22 NOTE — ED Provider Notes (Signed)
[facility] EMERGENCY DEPARTMENT Provider Note   CSN: [id number] Arrival date & time: 05/22/18  0214     History   Chief Complaint Chief Complaint  Patient presents with  . Wrist Pain    right    HPI Craig [name] is a 57 y.o. male.  57 y.o. male with PMHx of PE, DVT On Coumadin, DM, and HIV with CD4 of 550 March 2019 presenting with a 2-day history of atraumatic right wrist pain and swelling.  Denies any trauma.  Came in tonight because he was  not able to sleep due to pain.  He did not take any at home for pain however.  Denies fever, chills, nausea or vomiting.  Patient has had pain in his wrist in the past and has a questionable diagnosis of gout.  He states he has had gout in his knee but not in the wrist.  He has difficulty moving his wrist and fingers due to pain.  The history is provided by the patient.  Wrist Pain  Pertinent negatives include no chest pain, no abdominal pain, no headaches and no shortness of breath.    Past Medical History:  Diagnosis Date  . AIDS (HCC) 05/24/2015  . Allergic rhinitis 01/25/2016  . Atopic dermatitis 12/30/2017  . 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 Deep vein thrombosis (HCC) 05/09/2011   Overview:  Jan 2012   . Diabetes mellitus, controlled (HCC) 01/25/2016  . Dysplasia of anus   . Esophageal reflux   . 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 Hernia, inguinal 04/09/2013  . HIV (human immunodeficiency virus infection) (HCC)   . Horseshoe tear of retina without detachment 02-13-12  . 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  . Molluscum contagiosum 09-29-2012  . 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 infarction   . Other convulsions   . PE (pulmonary embolism) 04/09/2013   Overview:  Jan 2012   . Personal history of venous thrombosis and embolism   . Secondary iridocyclitis, infectious   . Septic arthritis of wrist (HCC)    rt  . Shortness of breath dyspnea     . Unspecified hereditary and idiopathic peripheral neuropathy   . 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 Viral URI 03/10/2018    Patient Active Problem List   Diagnosis Date Noted  . Viral URI 03/10/2018  . Atopic dermatitis 12/30/2017  . Obesity (BMI 30.0-34.9) 08/22/2017  . Diastasis recti 01/24/2017  . Diabetes mellitus, controlled (HCC) 01/25/2016  . Allergic rhinitis 01/25/2016  . Elevated INR   . Gout of right knee 07/28/2015  . HIV disease (HCC) 05/24/2015  . AIDS (HCC) 05/24/2015  . Hypotension 11/22/2014  . Iatrogenic hypotension 11/22/2014  . HIV infection (HCC) 03/08/2014  . History of pulmonary embolus (PE) 03/08/2014  . History of DVT (deep vein thrombosis) 03/08/2014  . H/O deep venous thrombosis 03/08/2014  . 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 H/O thrombosis 03/08/2014  . Long term current use of anticoagulant therapy 02/26/2014  . Coronary atherosclerosis of unspecified type of vessel, native or graft 02/26/2014  . Dermatitis 02/26/2014  . Depressive disorder, not elsewhere classified 02/26/2014  . Asthma, chronic 02/26/2014  . Coronary atherosclerosis 02/26/2014  . Clinical depression 02/26/2014  . Chronic diastolic heart failure (HCC) 04/09/2013  . Chronic cardiopulmonary disease (HCC) 04/09/2013  . Esophageal reflux 04/09/2013  . Essential hypertension 04/09/2013  . Gout 04/09/2013  . 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 Chronic pulmonary heart disease (HCC) 04/09/2013  .  Congestive heart failure (HCC) 04/09/2013  . Convulsions (HCC) 04/09/2013  . Diastolic heart failure (HCC) 04/09/2013  . History of anticoagulant therapy 04/09/2013  . Pulmonary embolism (HCC) 04/09/2013  . Allergy to latex 04/09/2013  . Inguinal hernia 04/09/2013  . 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 Dysplasia of anus 12/29/2012  . Molluscum contagiosum 09/29/2012  . 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 Horseshoe tear of retina 02/13/2012  . Deep vein  thrombosis (DVT) (HCC) 05/09/2011  . Airway hyperreactivity 09/08/2010  . Hereditary and idiopathic peripheral neuropathy 09/08/2010  . Human immunodeficiency virus (HIV) disease (HCC) 12/14/2009    Past Surgical History:  Procedure Laterality Date  . COLONOSCOPY    . I&D EXTREMITY Right 07/28/2015   Procedure: IRRIGATION AND DEBRIDEMENT WRIST;  Surgeon: Knute Neu, MD;  Location: [facility] OR;  Service: Plastics;  Laterality: Right;        Home Medications    Prior to Admission medications   Medication Sig Start Date End Date Taking? Authorizing Provider  ACCU-CHEK AVIVA PLUS test strip  07/16/17   [provider]  albuterol (PROVENTIL HFA;VENTOLIN HFA) 108 (90 BASE) MCG/ACT inhaler Inhale 2 puffs into the lungs every 6 (six) hours as needed for wheezing or shortness of breath.    [provider]  Alcohol Swabs (ALCOHOL PREP) 70 % PADS  07/16/17   [provider]  ASSURE COMFORT LANCETS 30G MISC  07/16/17   [provider]  atorvastatin (LIPITOR) 40 MG tablet Take 40 mg by mouth daily.    [provider]  bictegravir-emtricitabine-tenofovir AF (BIKTARVY) 50-200-25 MG TABS tablet Take 1 tablet by mouth daily. 12/30/17   Randall Hiss, MD  citalopram (CELEXA) 40 MG tablet Take 40 mg by mouth daily.    [provider]  clobetasol ointment (TEMOVATE) 0.05 % Apply 1 application topically 2 (two) times daily as needed (rash).  10/04/14   [provider]  Colchicine 0.6 MG CAPS One po bid 02/01/17   Elson Areas, PA-C  Dextromethorphan-Guaifenesin (ROBITUSSIN DM PO) Take by mouth.    [provider]  fluticasone (FLONASE) 50 MCG/ACT nasal spray USE 1 SPRAY EACH NOSTRIL DAILY AS NEEDED FOR ALLERIES 11/24/15   [provider]  folic acid (FOLVITE) 1 MG tablet Take 4 mg by mouth See admin instructions. Take everyday except on Friday.    [provider]  glipiZIDE (GLUCOTROL XL) 2.5 MG 24 hr tablet Take 2.5 mg  by mouth. 12/22/15   [provider]  HYDROcodone-homatropine (HYCODAN) 5-1.5 MG/5ML syrup Take 5 mLs by mouth every 6 (six) hours as needed for cough. 12/09/17   Mardella Layman, MD  hydrOXYzine (ATARAX/VISTARIL) 25 MG tablet Take 25 mg by mouth every 6 (six) hours as needed for anxiety.     [provider]  lisinopril (PRINIVIL,ZESTRIL) 2.5 MG tablet Take 2.5 mg by mouth daily.    [provider]  metFORMIN (GLUCOPHAGE) 500 MG tablet Take 1 tablet (500 mg total) by mouth 2 (two) times daily with a meal. 12/22/15   Allyne Gee, Rosezella Florida, PA-C  metFORMIN (GLUCOPHAGE-XR) 500 MG 24 hr tablet TK 2 TS PO Q MORNING BEFORE BREAKFAST 07/12/17   [provider]  methotrexate (RHEUMATREX) 2.5 MG tablet Take 8 tablets once a week on Friday. 11/24/15   [provider]  ondansetron (ZOFRAN) 4 MG tablet Take 1 tablet (4 mg total) by mouth every 8 (eight) hours as needed for nausea or vomiting. 03/10/18   Daiva Eves, Lisette Grinder, MD  oxyCODONE-acetaminophen (PERCOCET/ROXICET) 5-325 MG tablet Take 2 tablets by mouth every 4 (four) hours as needed for severe pain. Patient not taking: Reported on 12/30/2017 11/28/16   Long, Arlyss Repress, MD  Phenylephrine-Guaifenesin (MUCUS RELIEF D PO) Take by mouth.    [provider]  terbinafine (LAMISIL) 250 MG tablet  08/28/17   [provider]  triamcinolone ointment (KENALOG) 0.1 % Apply 1 application topically 2 (two) times daily as needed (for itching). On Face 05/12/15   [provider]  valACYclovir (VALTREX) 1000 MG tablet TK 1 T PO TID FOR 7 DAYS 08/03/17   [provider]  warfarin (COUMADIN) 5 MG tablet Take 5-7.5 mg by mouth daily. Take 1.5 (7.5mg ) daily except Sunday take 1 tab    [provider]    Family History No family history on file.  Social History Social History   Tobacco Use  . Smoking status: Never Smoker  . Smokeless tobacco: Never Used  Substance Use Topics  . Alcohol use: No     Alcohol/week: 0.0 oz  . Drug use: No     Allergies   Penicillins; Shellfish allergy; and Latex   Review of Systems Review of Systems  Constitutional: Negative for activity change, appetite change and fever.  HENT: Negative for congestion, dental problem and sinus pain.   Respiratory: Negative for cough, chest tightness and shortness of breath.   Cardiovascular: Negative for chest pain.  Gastrointestinal: Negative for abdominal pain, nausea and vomiting.  Genitourinary: Negative for dysuria, hematuria and penile swelling.  Musculoskeletal: Positive for arthralgias and myalgias. Negative for back pain and neck pain.  Skin: Negative for rash.  Neurological: Negative for dizziness, weakness and headaches.    all other systems are negative except as noted in the HPI and PMH.    Physical Exam Updated Vital Signs BP (!) 153/96 (BP Location: Left Arm)   Pulse 96   Temp 98.1 F (36.7 C) (Oral)   Resp 18   Ht 6' (1.829 m)   Wt 104.3 kg (230 lb)   SpO2 98%   BMI 31.19 kg/m   Physical Exam  Constitutional: He is oriented to person, place, and time. He appears well-developed and well-nourished. No distress.  HENT:  Head: Normocephalic and atraumatic.  Mouth/Throat: Oropharynx is clear and moist. No oropharyngeal exudate.  Eyes: Pupils are equal, round, and reactive to light. Conjunctivae and EOM are normal.  Neck: Normal range of motion. Neck supple.  No meningismus.  Cardiovascular: Normal rate, regular rhythm, normal heart sounds and intact distal pulses.  No murmur heard. Pulmonary/Chest: Effort normal and breath sounds normal. No respiratory distress.  Abdominal: Soft. There is no tenderness. There is no rebound and no guarding.  Musculoskeletal: He exhibits edema and tenderness.  Right wrist with diffuse dorsal swelling and warmth.  Reduced range of motion of wrist due to pain. Able to flex and extend fingers.   Intact radial pulse.  No overlying erythema.  Neurological:  He is alert and oriented to person, place, and time. No cranial nerve deficit. He exhibits normal muscle tone. Coordination normal.  No ataxia on finger to nose bilaterally. No pronator drift. 5/5 strength throughout. CN 2-12 intact.Equal grip strength. Sensation intact.   Skin: Skin is warm.  Psychiatric: He has a normal mood and affect. His behavior is normal.  Nursing note and vitals reviewed.    ED Treatments / Results  Labs (all labs ordered are listed, but only abnormal results are displayed) Labs Reviewed  CBC WITH DIFFERENTIAL/PLATELET -  Abnormal; Notable for the following components:      Result Value   RDW 16.4 (*)    All other components within normal limits  BASIC METABOLIC PANEL - Abnormal; Notable for the following components:   Glucose, Bld 102 (*)    All other components within normal limits  URIC ACID - Abnormal; Notable for the following components:   Uric Acid, Serum 8.1 (*)    All other components within normal limits  PROTIME-INR - Abnormal; Notable for the following components:   Prothrombin Time 25.1 (*)    All other components within normal limits  BODY FLUID CULTURE  GRAM STAIN  SYNOVIAL CELL COUNT + DIFF, W/ CRYSTALS  GLUCOSE, BODY FLUID OTHER  CBG MONITORING, ED    EKG None  Radiology Dg Forearm Right  Result Date: 05/22/2018 CLINICAL DATA:  Subacute onset of right wrist and forearm pain. EXAM: RIGHT FOREARM - 2 VIEW COMPARISON:  Right wrist radiograph performed 07/28/2015 FINDINGS: There is no evidence of fracture or dislocation. The radius and ulna appear intact. The elbow joint is grossly unremarkable. Mild degenerative osseous fragments are noted at the distal humerus. The carpal rows appear grossly intact, and demonstrate normal alignment. No definite soft tissue abnormalities are characterized on radiograph. IMPRESSION: No evidence of fracture or dislocation. Electronically Signed   By: Roanna Raider M.D.   On: 05/22/2018 03:00   Dg  Wrist Complete Right  Result Date: 05/22/2018 CLINICAL DATA:  Subacute onset of right wrist pain and swelling. EXAM: RIGHT WRIST - COMPLETE 3+ VIEW COMPARISON:  Right wrist radiographs performed 07/28/2015 FINDINGS: There is no evidence of fracture or dislocation. The carpal rows are intact, and demonstrate normal alignment. The joint spaces are preserved. Soft tissue swelling is noted about the wrist. IMPRESSION: No evidence of fracture or dislocation. Electronically Signed   By: Roanna Raider M.D.   On: 05/22/2018 02:59    Procedures .Joint Aspiration/Arthrocentesis Date/Time: 05/22/2018 5:33 AM Performed by: Glynn Octave, MD Authorized by: Glynn Octave, MD   Consent:    Consent obtained:  Verbal   Consent given by:  Patient   Risks discussed:  Bleeding, infection, nerve damage and pain   Alternatives discussed:  No treatment Location:    Location:  Wrist   Wrist:  R radiocarpal Anesthesia (see MAR for exact dosages):    Anesthesia method:  Topical application   Topical anesthetic:  Benzocaine gel Procedure details:    Preparation: Patient was prepped and draped in usual sterile fashion     Needle gauge:  22 G   Ultrasound guidance: no     Approach:  Anterior   Aspirate amount:  1 cc   Aspirate characteristics:  Blood-tinged   Steroid injected: no     Specimen collected: no   Post-procedure details:    Dressing:  Adhesive bandage   Patient tolerance of procedure:  Tolerated well, no immediate complications Comments:     Minimal fluid obtained. Procedure stopped at patient request.    (including critical care time)  Medications Ordered in ED Medications  lidocaine (PF) (XYLOCAINE) 1 % injection 5 mL (has no administration in time range)     Initial Impression / Assessment and Plan / ED Course  I have reviewed the triage vital signs and the nursing notes.  Pertinent labs & imaging results that were available during my care of the patient were reviewed by me and  considered in my medical decision making (see chart for details).    Patient with history  of HIV, PE on Coumadin, questionable gout presenting with 2 days of right wrist pain without trauma.  The wrist is warm and swollen and difficult to range.  Patient states he has had gout in the past but never in this wrist.  Chart review shows he was admitted for a questionable septic wrist 2016.  Patient is afebrile.  No leukocytosis.  Uric acid is elevated.  INR 2.3.  Arthrocentesis attempted with minimal fluid aspirated.  Patient asked procedure to terminate.  Discussed with Dr. Mina Marble of hand surgery.  Given patient's history of negative wrist washout in the past, he suspects gout.  Patient also had a aspiration of his knee last year which showed crystals.  Patient has no fever and no white count.  However given his HIV status, Dr. Mina Marble recommends antibiotics as well as anti-inflammatories.  He will follow-up the patient up in the office either later today or on June 3. Discussed with patient.  He is agreeable to follow-up with Dr. Mina Marble.  Return precautions discussed.   Final Clinical Impressions(s) / ED Diagnoses   Final diagnoses:  Swelling of joint of right wrist    ED Discharge Orders    None       Katonya Blecher, Jeannett Senior, MD 05/22/18 [phone]

## 2018-05-22 NOTE — ED Triage Notes (Signed)
Patient c/o right wrist pain that began hurting 2-3 weeks ago. Couldn't sleep tonight due to pain. States that he has forearm pain/ wrist pain and wrist swelling. Denies injury but states he has been moving furniture.

## 2018-05-22 NOTE — Discharge Instructions (Addendum)
As we discussed, we suspect gout in your wrist but cannot rule out infection.  Follow-up with Dr. Mina Marble in the office today or Monday.  Take the antibiotics and anti-inflammatories as prescribed.  Return to the ED if you develop worsening pain, fever, vomiting or other concerns.

## 2018-05-26 MED ORDER — TRIAMCINOLONE ACETONIDE 0.1 % TOPICAL CREAM
INTRAMUSCULAR | 0 refills | 0.00000 days | Status: CP
Start: 2018-05-26 — End: 2018-07-28

## 2018-05-26 MED FILL — DUPIXENT (ML)/300MG/2ML/SOSY: DUPIXENT (ML)/300MG/2ML/SOSY | 28 days supply | Qty: 4 | Fill #4

## 2018-05-27 ENCOUNTER — Encounter: Admit: 2018-05-27 | Discharge: 2018-05-28 | Payer: MEDICARE

## 2018-05-27 DIAGNOSIS — M109 Gout, unspecified: Secondary | ICD-10-CM

## 2018-05-27 DIAGNOSIS — I2699 Other pulmonary embolism without acute cor pulmonale: Principal | ICD-10-CM

## 2018-05-27 NOTE — Unmapped (Signed)
Assessment/Plan:     Current warfarin Dose:  5mg  M  7.5mg  others   Steady State:yes  Date INR: May 27, 2018  Goal INR: 2-3  INR Results: 1.5  Lab Results   Component Value Date    INR 1.5 05/27/2018    INR 3.3 05/12/2018    INR 2.3 04/14/2018    INR 2.60 05/28/2017    INR 2.7 03/30/2015    INR 2.1 02/23/2015       ASSESSMENT/PLAN:  Plan for Warfarin Dose:  increase dose: 7.5mg  daily    1. INR subtherapeutic.  Monitor doxy and prednisone effect  2. Gout - complete doxy and prednisone.  Continue colchicine, schedule PCP f/u to consider gout prophylaxis  3. DM - due for a1c    Repeat INR Date:  3 wks    Subject/Objective:     Patient: Ryan Davenport 57 y.o.    Medical Record Number:  [id number]    PRIMARY CARE PHYSICIAN: Santa Genera, MD    XB:[id number] VTE, gout, DM    PMHx:   Patient Active Problem List   Diagnosis   ??? 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 Allergy to latex   ??? Asthma, chronic (RAF-HCC)   ??? Chronic cardiopulmonary disease (CMS-HCC)   ??? Chronic diastolic heart failure (CMS-HCC)   ??? Cytomegaloviral disease (CMS-HCC)   ??? Dermatitis   ??? Dysplasia of anus   ??? Enthesopathy of knee   ??? Esophageal reflux   ??? Gout   ??? Horseshoe tear of retina   ??? Human immunodeficiency virus (HIV) disease (CMS-HCC)   ??? Essential hypertension   ??? Molluscum contagiosum   ??? Mononeuritis   ??? 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 infarction   ??? Convulsions (CMS-HCC)   ??? Hereditary and idiopathic peripheral neuropathy   ??? Inguinal hernia   ??? Early syphilis, secondary syphilis   ??? Coronary atherosclerosis (RAF-HCC)   ??? Acid reflux (RAF-HCC)   ??? Chronic eczema   ??? Depressive disorder, not elsewhere classified   ??? Allergic rhinitis   ??? Diabetes mellitus, controlled (CMS-HCC)   ??? Diastasis recti   ??? AIDS (CMS-HCC)   ??? Obesity (BMI 30.0-34.9)   ??? Disc disease, degenerative, lumbar or lumbosacral          Your Medication List           Accurate as of 05/27/18  1:41 PM. If you have any questions, ask your nurse or doctor. CONTINUE taking these medications    alcohol swabs Padm  Use to check blood sugars once daily. ICD-10 E11.9 (Type 2 DM). Dispense insurance approved     atorvastatin 40 MG tablet  Commonly known as:  LIPITOR  Take 1 tablet (40 mg total) by mouth daily.     betamethasone dipropionate 0.05 % ointment  Commonly known as:  DIPROLENE  Apply topically Two (2) times a day.     BIKTARVY 50-200-25 mg tablet  Generic drug:  bictegrav-emtricit-tenofov ala  Take 1 tablet by mouth daily at 0600.     blood sugar diagnostic Strp  Commonly known as:  ACCU-CHEK AVIVA PLUS TEST STRP  by Other route once daily. ICD-10 E11.9     blood-glucose meter kit  Use to check blood sugars once daily. ICD-10 E11.9 (Type 2 DM). Dispense insurance approved     brinzolamide-brimonidine 1-0.2 % Drps  Commonly known as:  SIMBRINZA  Administer 1 drop to the right eye Three (3) times a day.     citalopram 40 MG tablet  Commonly known as:  CeleXA  Take 1 tablet (40 mg total) by mouth daily.     colchicine 0.6 mg tablet  Commonly known as:  COLCRYS  Take 0.6 mg by mouth.     doxycycline 100 MG capsule  Commonly known as:  VIBRAMYCIN  Take 100 mg by mouth.     dupilumab 300 mg/2 mL Syrg injection  Commonly known as:  DUPIXENT  Inject 600mg  (4ml) subcutaneously on day 0, then 300mg  subcutaneously every 14 days.     fluticasone propionate 50 mcg/actuation nasal spray  Commonly known as:  FLONASE  USE 1 SPRAY IEN D     glipiZIDE 2.5 MG 24 hr tablet  Commonly known as:  GLUCOTROL XL  Take 1 tablet (2.5 mg total) by mouth daily.     hydrOXYzine 25 MG tablet  Commonly known as:  ATARAX  Take 1 tablet (25 mg total) by mouth nightly as needed for itching.     lisinopril 2.5 MG tablet  Commonly known as:  PRINIVIL,ZESTRIL  TAKE 1 TABLET BY MOUTH EVERY DAY     metFORMIN 500 MG 24 hr tablet  Commonly known as:  GLUCOPHAGE-XR  Take 2 tablets (1,000 mg total) by mouth every morning before breakfast.     oxyCODONE-acetaminophen 5-325 mg per tablet  Commonly known as:  PERCOCET  Take 1 tablet by mouth every four (4) hours as needed for pain.     prednisoLONE acetate 1 % ophthalmic suspension  Commonly known as:  PRED FORTE  Administer 1 drop to the right eye Three (3) times a day. For 1 week. Then 2x/day for 1 week. Then 1x/day for 1 week.     triamcinolone 0.1 % ointment  Commonly known as:  KENALOG  Apply twice a day to affected areas     triamcinolone 0.1 % cream  Commonly known as:  KENALOG  APPLY TO THE AFFECTED AREA TWICE DAILY     warfarin 5 MG tablet  Commonly known as:  COUMADIN  7.5mg  (1.5 tabs) daily or as directed              General:  Well appearing, no acute distress    Signs/Symptoms bleeding or bruising: no    Signs/Symptoms of Potential Embolic Events: no    Changes In:   Diet:no   Medications:yes   Activities / Lifestyle:no   Health Status:yes   Nonadherent: no       Current EtOH Use:no   Current Smoker/tobacco user: no    Medications reviewed including OTC and herbals:yes    The patient set a personal goal for anticoagulation management today:Take warfarin daily as recommended.    Additional History: ED eval at [facility] w/in past week for suspected gout R wrist, restarted colchicine BID at discharge.  Uric acid 8.1.  Also discharged on doxycycline and prednisone taper - still taking.

## 2018-06-17 ENCOUNTER — Encounter: Admit: 2018-06-17 | Discharge: 2018-06-18 | Payer: MEDICARE

## 2018-06-17 ENCOUNTER — Ambulatory Visit: Admit: 2018-06-17 | Discharge: 2018-06-18 | Payer: MEDICARE

## 2018-06-17 DIAGNOSIS — Z8701 Personal history of pneumonia (recurrent): Secondary | ICD-10-CM

## 2018-06-17 DIAGNOSIS — I824Y2 Acute embolism and thrombosis of unspecified deep veins of left proximal lower extremity: Principal | ICD-10-CM

## 2018-06-17 DIAGNOSIS — B370 Candidal stomatitis: Secondary | ICD-10-CM

## 2018-06-17 DIAGNOSIS — E119 Type 2 diabetes mellitus without complications: Secondary | ICD-10-CM

## 2018-06-17 DIAGNOSIS — M109 Gout, unspecified: Principal | ICD-10-CM

## 2018-06-17 DIAGNOSIS — J069 Acute upper respiratory infection, unspecified: Secondary | ICD-10-CM

## 2018-06-17 LAB — D-DIMER QUANTITATIVE (CH,ML,PD,ET): Lab: <150

## 2018-06-17 MED ORDER — NYSTATIN 100,000 UNIT/ML ORAL SUSPENSION
Freq: Four times a day (QID) | ORAL | 1 refills | 0 days | Status: CP
Start: 2018-06-17 — End: 2018-12-09

## 2018-06-17 NOTE — Unmapped (Signed)
Addended by: Salli Quarry B on: 06/17/2018 03:27 PM     Modules accepted: Orders

## 2018-06-17 NOTE — Unmapped (Signed)
1. Chest xray today  2. Blood work today

## 2018-06-17 NOTE — Unmapped (Addendum)
Assessment/Plan:     Current warfarin Dose:  7.5mg  daily  Steady State:yes  Date INR: June 17, 2018  Goal INR: 2-3  INR Results: 2.0  Lab Results   Component Value Date    INR 2.0 06/17/2018    INR 1.5 05/27/2018    INR 3.3 05/12/2018    INR 2.60 05/28/2017    INR 2.7 03/30/2015    INR 2.1 02/23/2015       ASSESSMENT/PLAN:  Plan for Warfarin Dose:  continue current dose    1. INR therapeutic.  Low suspicion for acute VTE, will obtain D-dimer given subtherapeutic last visit  2. H/o PNA - reports similar sxs returning.  Obtain XR  3. Gout - needs f/u w/ PCP  4. DM - due for a1c  5. Thrush - nystatin oral    **CXR, D-dimer unremarkable    Repeat INR Date:  4 wks    Subject/Objective:     Patient: Ryan Davenport 57 y.o.    Medical Record Number:  [id number]    PRIMARY CARE PHYSICIAN: Mart Piggs, MD    XB:JYNWGNFA VTE, CP, DM, gout    PMHx:   Patient Active Problem List   Diagnosis   ??? 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 Allergy to latex   ??? Asthma, chronic (RAF-HCC)   ??? Chronic cardiopulmonary disease (CMS-HCC)   ??? Chronic diastolic heart failure (CMS-HCC)   ??? Cytomegaloviral disease (CMS-HCC)   ??? Dermatitis   ??? Dysplasia of anus   ??? Enthesopathy of knee   ??? Esophageal reflux   ??? Gout   ??? Horseshoe tear of retina   ??? Human immunodeficiency virus (HIV) disease (CMS-HCC)   ??? Essential hypertension   ??? Molluscum contagiosum   ??? Mononeuritis   ??? 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 infarction   ??? Convulsions (CMS-HCC)   ??? Hereditary and idiopathic peripheral neuropathy   ??? Inguinal hernia   ??? Early syphilis, secondary syphilis   ??? Coronary atherosclerosis (RAF-HCC)   ??? Acid reflux (RAF-HCC)   ??? Chronic eczema   ??? Depressive disorder, not elsewhere classified   ??? Allergic rhinitis   ??? Diabetes mellitus, controlled (CMS-HCC)   ??? Diastasis recti   ??? AIDS (CMS-HCC)   ??? Obesity (BMI 30.0-34.9)   ??? Disc disease, degenerative, lumbar or lumbosacral          Your Medication List           Accurate as of 06/17/18  1:58 PM. If you have any questions, ask your nurse or doctor.               STOP taking these medications    brinzolamide-brimonidine 1-0.2 % Drps  Commonly known as:  SIMBRINZA     doxycycline 100 MG capsule  Commonly known as:  VIBRAMYCIN     oxyCODONE-acetaminophen 5-325 mg per tablet  Commonly known as:  PERCOCET     prednisoLONE acetate 1 % ophthalmic suspension  Commonly known as:  PRED FORTE        CONTINUE taking these medications    alcohol swabs Padm  Use to check blood sugars once daily. ICD-10 E11.9 (Type 2 DM). Dispense insurance approved     atorvastatin 40 MG tablet  Commonly known as:  LIPITOR  Take 1 tablet (40 mg total) by mouth daily.     betamethasone dipropionate 0.05 % ointment  Commonly known as:  DIPROLENE  Apply topically Two (2) times a day.     BIKTARVY  50-200-25 mg tablet  Generic drug:  bictegrav-emtricit-tenofov ala  Take 1 tablet by mouth daily at 0600.     blood sugar diagnostic Strp  Commonly known as:  ACCU-CHEK AVIVA PLUS TEST STRP  by Other route once daily. ICD-10 E11.9     blood-glucose meter kit  Use to check blood sugars once daily. ICD-10 E11.9 (Type 2 DM). Dispense insurance approved     citalopram 40 MG tablet  Commonly known as:  CeleXA  Take 1 tablet (40 mg total) by mouth daily.     colchicine 0.6 mg tablet  Commonly known as:  COLCRYS  Take 0.6 mg by mouth.     dupilumab 300 mg/2 mL Syrg injection  Commonly known as:  DUPIXENT  Inject 600mg  (4ml) subcutaneously on day 0, then 300mg  subcutaneously every 14 days.     fluticasone propionate 50 mcg/actuation nasal spray  Commonly known as:  FLONASE  USE 1 SPRAY IEN D     glipiZIDE 2.5 MG 24 hr tablet  Commonly known as:  GLUCOTROL XL  Take 1 tablet (2.5 mg total) by mouth daily.     hydrOXYzine 25 MG tablet  Commonly known as:  ATARAX  Take 1 tablet (25 mg total) by mouth nightly as needed for itching.     lisinopril 2.5 MG tablet  Commonly known as:  PRINIVIL,ZESTRIL  TAKE 1 TABLET BY MOUTH EVERY DAY     metFORMIN 500 MG 24 hr tablet  Commonly known as:  GLUCOPHAGE-XR  Take 2 tablets (1,000 mg total) by mouth every morning before breakfast.     triamcinolone 0.1 % ointment  Commonly known as:  KENALOG  Apply twice a day to affected areas     triamcinolone 0.1 % cream  Commonly known as:  KENALOG  APPLY TO THE AFFECTED AREA TWICE DAILY     warfarin 5 MG tablet  Commonly known as:  COUMADIN  7.5mg  (1.5 tabs) daily or as directed              General:  Well appearing, no acute distress    Signs/Symptoms bleeding or bruising: no    Signs/Symptoms of Potential Embolic Events: yes    Changes In:   Diet:no   Medications:no   Activities / Lifestyle:no   Health Status:no   Nonadherent: no       Current EtOH Use:no   Current Smoker/tobacco user: no    Medications reviewed including OTC and herbals:yes    The patient set a personal goal for anticoagulation management today:Take warfarin daily as recommended.    Additional History: has been having R side chest/abdom pain under ribs.  Not related to eating.  No f/ch.  No sputum or cough.  Reports vaguely feels similar to previous PE and PNA.  On another note, c/o white painful areas on tongue    Phy EX: NAD, a/ox3, lungs CTA bilat, unappreciable murmurs, abdomen nontender

## 2018-06-18 NOTE — Unmapped (Signed)
Patient states he was on an antibiotic last month for 2 weeks but did not miss a dose during this time  He is feeling better  However he does report he got a chest xray to check on some chest pains he has been having  He reports he does his dupixent on thursdays but he works fridays in a [employer] and says it's just too much for him so he will be changing his dosing to Mondays so he doesn't have to be in the heat the day after injection  He was ok with delivery on 7/2- I asked if he wanted to wait on the delivery until after he got the xray results back and he said no it was fine  He sounds well    [facility] Refill Coordination Note    Specialty Medication(s) to be Shipped:   Inflammatory Disorders: Dupixent    Other medication(s) to be shipped: n/a     Shanna Cisco, DOB: May 09, 1961  Phone: [phone] (home)   Shipping Address: [address]  [name] [place] [address]    All above HIPAA information was verified with patient.     Completed refill call assessment today to schedule patient's medication shipment from the [facility] [phone]).       Specialty medication(s) and dose(s) confirmed: Regimen is correct and unchanged.   Changes to medications: Draven reports no changes reported at this time.  Changes to insurance: No  Questions for the pharmacist: No    The patient will receive an FSI print out for each medication shipped and additional FDA Medication Guides as required.  Patient education from [place] or [name] may also be included in the shipment.    DISEASE-SPECIFIC INFORMATION        For Inflammatory disorders patients on injectable medications: Patient currently has 1 doses left.  Next injection is scheduled for tomorrow (new box on 7/8 or 7/11 depending on if he changes his dose day to monday or not).    ADHERENCE     Medication Adherence    Patient reported X missed doses in the last month:  0  Specialty Medication:  DUPIXENT  Patient is on additional specialty medications:  No  Patient is on more than two specialty medications:  No  Any gaps in refill history greater than 2 weeks in the last 3 months:  no  Demonstrates understanding of importance of adherence:  yes  Informant:  patient  Reliability of informant:  reliable  Confirmed plan for next specialty medication refill:  delivery by pharmacy  Refills needed for supportive medications:  not needed          Refill Coordination    Has the Patients' Contact Information Changed:  No  Is the Shipping Address Different:  No         SHIPPING     Shipping address confirmed in FSI.     Delivery Scheduled: Yes, Expected medication delivery date: 7/2 via UPS or courier.     [name]   [facility] Specialty Technician

## 2018-06-22 MED FILL — DUPIXENT (ML)/300MG/2ML/SOSY: DUPIXENT (ML)/300MG/2ML/SOSY | 28 days supply | Qty: 4 | Fill #5

## 2018-06-27 ENCOUNTER — Other Ambulatory Visit: Payer: Medicare Other

## 2018-07-02 ENCOUNTER — Ambulatory Visit: Admit: 2018-07-02 | Discharge: 2018-07-03 | Payer: MEDICARE

## 2018-07-02 DIAGNOSIS — L720 Epidermal cyst: Secondary | ICD-10-CM

## 2018-07-02 DIAGNOSIS — L209 Atopic dermatitis, unspecified: Principal | ICD-10-CM

## 2018-07-02 DIAGNOSIS — L219 Seborrheic dermatitis, unspecified: Secondary | ICD-10-CM

## 2018-07-02 MED ORDER — CLOBETASOL 0.05 % TOPICAL OINTMENT
Freq: Two times a day (BID) | TOPICAL | 3 refills | 0.00000 days | Status: CP
Start: 2018-07-02 — End: 2018-12-15

## 2018-07-02 MED ORDER — CETIRIZINE 10 MG TABLET
ORAL_TABLET | Freq: Every day | ORAL | 3 refills | 0.00000 days | Status: CP
Start: 2018-07-02 — End: 2018-12-15

## 2018-07-02 MED ORDER — BETAMETHASONE DIPROPIONATE 0.05 % TOPICAL OINTMENT
Freq: Two times a day (BID) | TOPICAL | 3 refills | 0.00000 days | Status: CP
Start: 2018-07-02 — End: 2018-12-15

## 2018-07-02 MED ORDER — HYDROXYZINE HCL 10 MG TABLET
ORAL_TABLET | Freq: Every evening | ORAL | 2 refills | 0.00000 days | Status: CP
Start: 2018-07-02 — End: 2018-10-17

## 2018-07-02 NOTE — Unmapped (Signed)
Dermatology Follow-up Note    A/P:    Atopic Dermatitis: Previously severe, now improved on dupilumab  - Cont dupilumab  - Start clobetasol to recalcitrant plaques  - Start betamethasone in place of TAC  - Start zyrtec 10mg  qPM, continue atarax 10mg  nightly    High risk med use: Dupilumab  - No monitoring required at this time    Milia:  - Reassurance provided regarding benign nature    Seb derm of the face:  - Given other changes to TCS regimen, will defer adding additional topical agents at thsi time to minimize patient confusion. In the future could consider ketoconazole and hydrocortisone    Return in about 5 months (around 12/02/2018).       CC:  Follow up AD    HPI:  Ryan Davenport is a 57 year old male with PMH of HIV last seen by Dr. Dub Mikes 12/2017 for atopic dermatitis. At [place], stopped MTX and initiated dupilumab. for kerion of L scalp/face and AD on MTX as below, all in setting of well-controlled HIV.    Today, he states that is AD is still flared. He thinks it is controlled about the same on Dupixent as it was on MTX. He is still having significant itching and has been scratching a lot, particularly on the legs. He does take something at night to help with itching and states that it does help him sleep. Had stopped clobetasol before, is using TAC PRN but does not think it is really helping much. He is tolerating dupilumab well, specifically denying any signs/symptoms of conjunctivitis.    Also complains of some pruritic white bumps on bilateral clavicles that appeared suddenly. They have not been irritated or bleeding. No treatment attempted.    Some new hypopigmentation on bilateral cheeks, not sure for how long. Denies associated pruritus. No tx attempted.    Pertinent PMH:  No history of skin cancer  HIV, well controlled, on HAART  Atopic dermatitis Previously on MTX d/c 12/2017 (Fibroscan 07/2016 w/ F0, at cumulative dose of 3180 mg), now on dupilumab    SH:  Lives in [place], [place]    ROS: Negative except as noted in HPI.    PE:  General: Well-developed, well-nourished male, in no acute distress.   Neuro: Alert and oriented, and answers questions appropriately.  Skin: Per patient request, focused exam with inspection and palpation of the scalp, face, neck, bilateral arms, abdomen was performed today. Findings were normal with exception of the following:  - Multiple eczematous plaques on trunk and extremities with associated excoriations  - Multiple 2-3 mm white papules located on the the clavicles  - Ill defined hypopigmented patches on the b/l mesolabial folds  - All other areas examined were normal or had no significant findings      The patient was seen and examined by Ria Bush, MD who agrees with the assessment and plan as above.

## 2018-07-02 NOTE — Unmapped (Signed)
Take atarax at night and zyrtec in the morning for itching. Apply clobetasol to lesions on the feet and any other stubborn places. Apply betamethasone to back.

## 2018-07-07 ENCOUNTER — Other Ambulatory Visit: Payer: Medicare Other

## 2018-07-07 DIAGNOSIS — B20 Human immunodeficiency virus [HIV] disease: Secondary | ICD-10-CM

## 2018-07-07 MED ORDER — LISINOPRIL 2.5 MG TABLET
ORAL_TABLET | 1 refills | 0 days | Status: CP
Start: 2018-07-07 — End: 2018-11-04

## 2018-07-08 LAB — T-HELPER CELL (CD4) - (RCID CLINIC ONLY)
CD4 % Helper T Cell: 30 % — ABNORMAL LOW (ref 33–55)
CD4 T CELL ABS: 780 /uL (ref 400–2700)

## 2018-07-08 LAB — COMPLETE METABOLIC PANEL WITH GFR
AG Ratio: 1.2 (calc) (ref 1.0–2.5)
ALBUMIN MSPROF: 4.3 g/dL (ref 3.6–5.1)
ALT: 24 U/L (ref 9–46)
AST: 28 U/L (ref 10–35)
Alkaline phosphatase (APISO): 67 U/L (ref 40–115)
BUN: 11 mg/dL (ref 7–25)
CO2: 27 mmol/L (ref 20–32)
Calcium: 9.7 mg/dL (ref 8.6–10.3)
Chloride: 103 mmol/L (ref 98–110)
Creat: 1.19 mg/dL (ref 0.70–1.33)
GFR, EST AFRICAN AMERICAN: 79 mL/min/{1.73_m2} (ref >=60)
GFR, Est Non African American: 68 mL/min/{1.73_m2} (ref >=60)
GLOBULIN: 3.7 g/dL (ref 1.9–3.7)
GLUCOSE: 160 mg/dL — AB (ref 65–99)
Potassium: 4.3 mmol/L (ref 3.5–5.3)
SODIUM: 137 mmol/L (ref 135–146)
TOTAL PROTEIN: 8 g/dL (ref 6.1–8.1)
Total Bilirubin: 0.4 mg/dL (ref 0.2–1.2)

## 2018-07-08 LAB — CBC WITH DIFFERENTIAL/PLATELET
BASOS PCT: 0.3 %
Basophils Absolute: 18 cells/uL (ref 0–200)
EOS PCT: 3.1 %
Eosinophils Absolute: 183 cells/uL (ref 15–500)
HCT: 41.4 % (ref 38.5–50.0)
HEMOGLOBIN: 13.9 g/dL (ref 13.2–17.1)
Lymphs Abs: 2803 cells/uL (ref 850–3900)
MCH: 28.6 pg (ref 27.0–33.0)
MCHC: 33.6 g/dL (ref 32.0–36.0)
MCV: 85.2 fL (ref 80.0–100.0)
MPV: 10.3 fL (ref 7.5–12.5)
Monocytes Relative: 9.4 %
NEUTROS ABS: 2342 {cells}/uL (ref 1500–7800)
Neutrophils Relative %: 39.7 %
Platelets: 183 10*3/uL (ref 140–400)
RBC: 4.86 10*6/uL (ref 4.20–5.80)
RDW: 15.4 % — ABNORMAL HIGH (ref 11.0–15.0)
Total Lymphocyte: 47.5 %
WBC mixed population: 555 cells/uL (ref 200–950)
WBC: 5.9 10*3/uL (ref 3.8–10.8)

## 2018-07-08 LAB — RPR: RPR: REACTIVE — AB

## 2018-07-08 LAB — RPR TITER: RPR Titer: 1:32 {titer} — ABNORMAL HIGH

## 2018-07-08 LAB — FLUORESCENT TREPONEMAL AB(FTA)-IGG-BLD: FLUORESCENT TREPONEMAL ABS: REACTIVE — AB

## 2018-07-09 ENCOUNTER — Telehealth: Payer: Self-pay | Admitting: Behavioral Health

## 2018-07-09 LAB — HIV-1 RNA QUANT-NO REFLEX-BLD
HIV 1 RNA Quant: <20 copies/mL
HIV-1 RNA Quant, Log: <1.3 Log copies/mL

## 2018-07-09 NOTE — Telephone Encounter (Signed)
-----   Message from Randall Hissornelius N Van Dam, MD sent at 07/08/2018  9:17 PM EDT ----- Patient at [facility] needs 3 IM doses of 2.4 MU of PCN. 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 Partners need to be tested and treated.

## 2018-07-09 NOTE — Telephone Encounter (Signed)
Called patient, verified identity informed him per Dr. Daiva EvesVan Dam that he tested positive for Syphilis.  Told patient he will need to come in for injections and patient informed me he is highly allergic to Penicillin.  Will f/u with Dr. Daiva EvesVan Dam for further instructions.  Will call patient back with updated information.  Patient has an upcoming appointment 07/22/2018. Angeline SlimAshley Arizona Sorn RN

## 2018-07-10 ENCOUNTER — Encounter (HOSPITAL_COMMUNITY): Payer: Self-pay | Admitting: Emergency Medicine

## 2018-07-10 ENCOUNTER — Emergency Department (HOSPITAL_COMMUNITY)
Admission: EM | Admit: 2018-07-10 | Discharge: 2018-07-10 | Disposition: A | Discharge status: Home / Self Care | Payer: Medicare Other | Attending: Emergency Medicine | Admitting: Emergency Medicine

## 2018-07-10 ENCOUNTER — Emergency Department (HOSPITAL_COMMUNITY): Payer: Medicare Other

## 2018-07-10 ENCOUNTER — Other Ambulatory Visit: Payer: Self-pay

## 2018-07-10 DIAGNOSIS — E119 Type 2 diabetes mellitus without complications: Secondary | ICD-10-CM | POA: Insufficient documentation

## 2018-07-10 DIAGNOSIS — Z91040 Latex allergy status: Secondary | ICD-10-CM | POA: Insufficient documentation

## 2018-07-10 DIAGNOSIS — M79675 Pain in left toe(s): Secondary | ICD-10-CM | POA: Diagnosis not present

## 2018-07-10 DIAGNOSIS — J45909 Unspecified asthma, uncomplicated: Secondary | ICD-10-CM | POA: Insufficient documentation

## 2018-07-10 DIAGNOSIS — Z79899 Other long term (current) drug therapy: Secondary | ICD-10-CM | POA: Diagnosis not present

## 2018-07-10 MED ORDER — DICLOFENAC SODIUM 1 % TD GEL: 2.0000 g | Freq: Four times a day (QID) | TRANSDERMAL | 0 refills | Status: DC

## 2018-07-10 NOTE — Discharge Instructions (Signed)
Apply voltaren gel to foot to help with pain and swelling. Use ice, 20 minutes at a time, 3-4 times a day to help with pain and swelling. Use buddy tape in the postop shoe to help decrease pain. Keep your foot elevated when able. Follow-up with your primary care doctor in 1 week if your symptoms are not improving. Return to the emergency room with any new, worsening, or concerning symptoms.

## 2018-07-10 NOTE — Telephone Encounter (Signed)
Lets just give him 2.4 MU IM weekly x 3 weeks

## 2018-07-10 NOTE — ED Notes (Signed)
ED Provider at bedside. 

## 2018-07-10 NOTE — ED Notes (Signed)
Pt in room VS documented Call light within reach 

## 2018-07-10 NOTE — ED Provider Notes (Signed)
Patient placed in Quick Look pathway, seen and evaluated   Chief Complaint: left foot pain  HPI:   Craig [name] is a 57 y.o. male who presents to the ED with pain in the toes of the left foot after he hit his toes on a coffee table. Patient denies other injuries.   ROS: [name]/S: left foot pain  Physical Exam:  BP 109/89 (BP Location: Right Arm)   Pulse (!) 120   Temp 99.3 F (37.4 C) (Oral)   Resp 14   SpO2 98%    Gen: No distress  Neuro: Awake and Alert  Skin: intact  [name]/S: left foot toes tender with palpation mild swelling of the 3rd and 4th toes.      Initiation of care has begun. The patient has been counseled on the process, plan, and necessity for staying for the completion/evaluation, and the remainder of the medical screening examination    Craig [name], Craig M, NP 07/10/18 2001    Eber HongMiller, Brian, MD 07/15/18 1139

## 2018-07-10 NOTE — ED Triage Notes (Signed)
Pt presents with swollen 2nd and 3rd toes on left foot. Reports hitting them on a table yesterday.

## 2018-07-11 ENCOUNTER — Encounter: Payer: Medicare Other | Admitting: Infectious Disease

## 2018-07-11 NOTE — ED Provider Notes (Addendum)
[facility]   CSN: [id number] Arrival date & time: 07/10/18  1933     History   Chief Complaint Chief Complaint  Patient presents with  . Toe Pain    HPI Craig [name] is a 57 y.o. male presenting for evaluation of left toe pain.  Patient states 2 nights ago he jammed his foot into the edge of a coffee table.  Since then, he has been having pain of his second through fourth toes on his left side.  He reports pain is been constant, not improving or worsening.  He has not taken anything for pain, as he is on warfarin and does not want to cause interactions.  He denies numbness or tingling.  He denies laceration.  Pain is worse with palpation and movement, nothing makes it better.  He denies radiation of the pain.  He denies injury elsewhere.  HPI  Past Medical History:  Diagnosis Date  . AIDS (HCC) 05/24/2015  . Allergic rhinitis 01/25/2016  . Atopic dermatitis 12/30/2017  . 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 Deep vein thrombosis (HCC) 05/09/2011   Overview:  Jan 2012   . Diabetes mellitus, controlled (HCC) 01/25/2016  . Dysplasia of anus   . Esophageal reflux   . 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 Hernia, inguinal 04/09/2013  . HIV (human immunodeficiency virus infection) (HCC)   . Horseshoe tear of retina without detachment 02-13-12  . 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  . Molluscum contagiosum 09-29-2012  . 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 infarction   . Other convulsions   . PE (pulmonary embolism) 04/09/2013   Overview:  Jan 2012   . Personal history of venous thrombosis and embolism   . Secondary iridocyclitis, infectious   . Septic arthritis of wrist (HCC)    rt  . Shortness of breath dyspnea   . Unspecified hereditary and idiopathic peripheral neuropathy   . 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 Viral URI 03/10/2018    Patient Active  Problem List   Diagnosis Date Noted  . Viral URI 03/10/2018  . Atopic dermatitis 12/30/2017  . Obesity (BMI 30.0-34.9) 08/22/2017  . Diastasis recti 01/24/2017  . Diabetes mellitus, controlled (HCC) 01/25/2016  . Allergic rhinitis 01/25/2016  . Elevated INR   . Gout of right knee 07/28/2015  . HIV disease (HCC) 05/24/2015  . AIDS (HCC) 05/24/2015  . Hypotension 11/22/2014  . Iatrogenic hypotension 11/22/2014  . HIV infection (HCC) 03/08/2014  . History of pulmonary embolus (PE) 03/08/2014  . History of DVT (deep vein thrombosis) 03/08/2014  . H/O deep venous thrombosis 03/08/2014  . 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 H/O thrombosis 03/08/2014  . Long term current use of anticoagulant therapy 02/26/2014  . Coronary atherosclerosis of unspecified type of vessel, native or graft 02/26/2014  . Dermatitis 02/26/2014  . Depressive disorder, not elsewhere classified 02/26/2014  . Asthma, chronic 02/26/2014  . Coronary atherosclerosis 02/26/2014  . Clinical depression 02/26/2014  . Chronic diastolic heart failure (HCC) 04/09/2013  . Chronic cardiopulmonary disease (HCC) 04/09/2013  . Esophageal reflux 04/09/2013  . Essential hypertension 04/09/2013  . Gout 04/09/2013  . 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 Chronic pulmonary heart disease (HCC) 04/09/2013  . Congestive heart failure (HCC) 04/09/2013  . Convulsions (HCC) 04/09/2013  . Diastolic heart failure (HCC) 04/09/2013  . History of anticoagulant therapy 04/09/2013  . Pulmonary embolism (HCC) 04/09/2013  . Allergy to latex 04/09/2013  . Inguinal  hernia 04/09/2013  . 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 Dysplasia of anus 12/29/2012  . Molluscum contagiosum 09/29/2012  . 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 Horseshoe tear of retina 02/13/2012  . Deep vein thrombosis (DVT) (HCC) 05/09/2011  . Airway hyperreactivity 09/08/2010  . Hereditary and idiopathic peripheral neuropathy 09/08/2010  . Human  immunodeficiency virus (HIV) disease (HCC) 12/14/2009    Past Surgical History:  Procedure Laterality Date  . COLONOSCOPY    . I&D EXTREMITY Right 07/28/2015   Procedure: IRRIGATION AND DEBRIDEMENT WRIST;  Surgeon: Knute Neu, MD;  Location: [facility] OR;  Service: Plastics;  Laterality: Right;        Home Medications    Prior to Admission medications   Medication Sig Start Date End Date Taking? Authorizing Provider  ACCU-CHEK AVIVA PLUS test strip  07/16/17   [provider]  albuterol (PROVENTIL HFA;VENTOLIN HFA) 108 (90 BASE) MCG/ACT inhaler Inhale 2 puffs into the lungs every 6 (six) hours as needed for wheezing or shortness of breath.    [provider]  Alcohol Swabs (ALCOHOL PREP) 70 % PADS  07/16/17   [provider]  ASSURE COMFORT LANCETS 30G MISC  07/16/17   [provider]  atorvastatin (LIPITOR) 40 MG tablet Take 40 mg by mouth daily.    [provider]  bictegravir-emtricitabine-tenofovir AF (BIKTARVY) 50-200-25 MG TABS tablet Take 1 tablet by mouth daily. 12/30/17   Randall Hiss, MD  citalopram (CELEXA) 40 MG tablet Take 40 mg by mouth daily.    [provider]  clobetasol ointment (TEMOVATE) 0.05 % Apply 1 application topically 2 (two) times daily as needed (rash).  10/04/14   [provider]  colchicine 0.6 MG tablet Take 1 tablet (0.6 mg total) by mouth daily. 05/22/18   Glynn Octave, MD  Dextromethorphan-Guaifenesin (ROBITUSSIN DM PO) Take by mouth.    [provider]  diclofenac sodium (VOLTAREN) 1 % GEL Apply 2 g topically 4 (four) times daily. 07/10/18   Yessika Otte, PA-C  doxycycline (VIBRAMYCIN) 100 MG capsule Take 1 capsule (100 mg total) by mouth 2 (two) times daily. 05/22/18   Rancour, Jeannett Senior, MD  fluticasone (FLONASE) 50 MCG/ACT nasal spray USE 1 SPRAY EACH NOSTRIL DAILY AS NEEDED FOR ALLERIES 11/24/15   [provider]  folic acid (FOLVITE) 1 MG tablet Take 4 mg by mouth See  admin instructions. Take everyday except on Friday.    [provider]  glipiZIDE (GLUCOTROL XL) 2.5 MG 24 hr tablet Take 2.5 mg by mouth. 12/22/15   [provider]  HYDROcodone-homatropine (HYCODAN) 5-1.5 MG/5ML syrup Take 5 mLs by mouth every 6 (six) hours as needed for cough. 12/09/17   Mardella Layman, MD  hydrOXYzine (ATARAX/VISTARIL) 25 MG tablet Take 25 mg by mouth every 6 (six) hours as needed for anxiety.     [provider]  lisinopril (PRINIVIL,ZESTRIL) 2.5 MG tablet Take 2.5 mg by mouth daily.    [provider]  metFORMIN (GLUCOPHAGE) 500 MG tablet Take 1 tablet (500 mg total) by mouth 2 (two) times daily with a meal. 12/22/15   Allyne Gee, Rosezella Florida, PA-C  metFORMIN (GLUCOPHAGE-XR) 500 MG 24 hr tablet TK 2 TS PO Q MORNING BEFORE BREAKFAST 07/12/17   [provider]  methotrexate (RHEUMATREX) 2.5 MG tablet Take 8 tablets once a week on Friday. 11/24/15   [provider]  ondansetron (ZOFRAN) 4 MG tablet Take 1 tablet (4 mg total) by mouth every 8 (eight) hours as needed for nausea  or vomiting. 03/10/18   Daiva Eves, Lisette Grinder, MD  oxyCODONE-acetaminophen (PERCOCET/ROXICET) 5-325 MG tablet Take 2 tablets by mouth every 4 (four) hours as needed for severe pain. Patient not taking: Reported on 12/30/2017 11/28/16   Long, Arlyss Repress, MD  Phenylephrine-Guaifenesin (MUCUS RELIEF D PO) Take by mouth.    [provider]  predniSONE (DELTASONE) 50 MG tablet As directed 05/22/18   Rancour, Jeannett Senior, MD  terbinafine (LAMISIL) 250 MG tablet  08/28/17   [provider]  triamcinolone ointment (KENALOG) 0.1 % Apply 1 application topically 2 (two) times daily as needed (for itching). On Face 05/12/15   [provider]  valACYclovir (VALTREX) 1000 MG tablet TK 1 T PO TID FOR 7 DAYS 08/03/17   [provider]  warfarin (COUMADIN) 5 MG tablet Take 5-7.5 mg by mouth daily. Take 1.5 (7.5mg ) daily except Sunday take 1 tab    [provider]    Family History No family history on file.  Social History Social History   Tobacco Use  . Smoking status: Never Smoker  . Smokeless tobacco: Never Used  Substance Use Topics  . Alcohol use: No    Alcohol/week: 0.0 oz  . Drug use: No     Allergies   Penicillins; Shellfish allergy; and Latex   Review of Systems Review of Systems  Musculoskeletal: Positive for arthralgias.  Neurological: Negative for numbness.  Hematological: Bruises/bleeds easily.     Physical Exam Updated Vital Signs BP 113/86   Pulse 99   Temp 99.3 F (37.4 C) (Oral)   Resp 18   SpO2 100%   Physical Exam  Constitutional: He is oriented to person, place, and time. He appears well-developed and well-nourished. No distress.  HENT:  Head: Normocephalic and atraumatic.  Eyes: EOM are normal.  Neck: Normal range of motion.  Pulmonary/Chest: Effort normal.  Abdominal: He exhibits no distension.  Musculoskeletal: Normal range of motion. He exhibits tenderness. He exhibits no edema or deformity.  No significant or obvious swelling of the left foot noted.  Pedal pulses intact bilaterally.  Color and warmth equal bilaterally.  Good cap refill of all toes.  Tenderness palpation of the MTP of the second through fourth toes on the left.  No tenderness elsewhere in the foot.  Neurological: He is alert and oriented to person, place, and time. No sensory deficit.  Skin: Skin is warm. Capillary refill takes less than 2 seconds. No rash noted.  Psychiatric: He has a normal mood and affect.  Nursing note and vitals reviewed.    ED Treatments / Results  Labs (all labs ordered are listed, but only abnormal results are displayed) Labs Reviewed - No data to display  EKG None  Radiology Dg Foot Complete Left  Result Date: 07/10/2018 CLINICAL DATA:  Hit toes on table.  Second and 3rd toe swelling EXAM: LEFT FOOT - COMPLETE 3+ VIEW COMPARISON:  01/25/2015 FINDINGS: No acute bony abnormality.  Specifically, no fracture, subluxation, or dislocation. Chronic stable erosion in the distal aspect of the left 1st toe proximal phalanx at the IP joint. IMPRESSION: No acute bony abnormality. Electronically Signed   By: Charlett Nose M.D.   On: 07/10/2018 21:07    Procedures Procedures (including critical care time)  Medications Ordered in ED Medications - No data to display   Initial Impression / Assessment and Plan / ED Course  I have reviewed the triage vital signs and the nursing notes.  Pertinent labs & imaging results that were available during  my care of the patient were reviewed by me and considered in my medical decision making (see chart for details).     Patient presenting for evaluation of left foot pain.  Physical exam reassuring, he is neurovascularly intact.  X-ray viewed and interpreted by me, no fractures or dislocations.  No open injury.  Likely ligamentous injury/sprain.  Discussed with patient.  Discussed treatment with Voltaren gel, rest, ice, elevation, and buddy tape.  Discussed use of postop shoe.  Patient to follow-up with primary care if symptoms are not improving.  At this time, patient appears safe for discharge.  Return precautions given.  Patient states he understands and agrees plan.  Patient heart rate initially elevated, improved without treatment while in the ER.  Per chart review, patient with frequent elevation in heart rate.  No chest pain or shortness of breath.   Final Clinical Impressions(s) / ED Diagnoses   Final diagnoses:  Toe pain, left    ED Discharge Orders        Ordered    diclofenac sodium (VOLTAREN) 1 % GEL  4 times daily     07/10/18 2218       Alveria Apley, PA-C 07/11/18 0127    Alveria Apley, PA-C 07/11/18 0128    Arby Barrette, MD 07/26/18 2313

## 2018-07-14 NOTE — Unmapped (Signed)
Patient is doing well on the dupixent so far.   He has one pen left at this time to use Thursday  He states he hasn't changed any medications recently     [facility] Refill Coordination Note    Specialty Medication(s) to be Shipped:   Inflammatory Disorders: Dupixent    Other medication(s) to be shipped: n/a     Ryan Davenport, DOB: 06-03-1961  Phone: [phone] (home)   Shipping Address: [address]  [address] [name] [id number]    All above HIPAA information was verified with patient.     Completed refill call assessment today to schedule patient's medication shipment from the [facility] [phone]).       Specialty medication(s) and dose(s) confirmed: Regimen is correct and unchanged.   Changes to medications: Ryan Davenport reports no changes reported at this time.  Changes to insurance: No  Questions for the pharmacist: No    The patient will receive an [facility]SI print out for each medication shipped and additional FDA Medication Guides as required.  Patient education from [place] or [name] may also be included in the shipment.    DISEASE/MEDICATION-SPECIFIC INFORMATION        For Inflammatory disorders patients on injectable medications: Patient currently has 1 doses left.  Next injection is scheduled for thursday (new box on 8/8).    ADHERENCE     Medication Adherence    Patient reported X missed doses in the last month:  0  Specialty Medication:  dupixent  Patient is on additional specialty medications:  No  Patient is on more than two specialty medications:  No  Any gaps in refill history greater than 2 weeks in the last 3 months:  no  Demonstrates understanding of importance of adherence:  yes  Informant:  patient  Reliability of informant:  reliable  Confirmed plan for next specialty medication refill:  delivery by pharmacy  Refills needed for supportive medications:  not needed          Refill Coordination    Has the Patients' Contact Information Changed:  No Is the Shipping Address Different:  No         SHIPPING     Shipping address confirmed in FSI.     Delivery Scheduled: Yes, Expected medication delivery date: 7/31 via UPS or courier.     [name]   [facility] Specialty Technician

## 2018-07-14 NOTE — Telephone Encounter (Signed)
Called patient to set him up for injections for Bicillin.  Patient states he did not have an allergic reaction to Bicillin when he was previously treated 11/2016.  Patient states, "I keep a rash, I have Eczema."   Informed patient we will monitor him after Bicillin administration. Angeline SlimAshley Hill RN

## 2018-07-15 ENCOUNTER — Encounter
Admit: 2018-07-15 | Discharge: 2018-07-15 | Payer: MEDICARE | Attending: Student in an Organized Health Care Education/Training Program | Primary: Student in an Organized Health Care Education/Training Program

## 2018-07-15 ENCOUNTER — Ambulatory Visit: Admit: 2018-07-15 | Discharge: 2018-07-15 | Payer: MEDICARE

## 2018-07-15 ENCOUNTER — Ambulatory Visit: Payer: Medicare Other

## 2018-07-15 ENCOUNTER — Telehealth: Payer: Self-pay

## 2018-07-15 DIAGNOSIS — I824Y2 Acute embolism and thrombosis of unspecified deep veins of left proximal lower extremity: Principal | ICD-10-CM

## 2018-07-15 DIAGNOSIS — E119 Type 2 diabetes mellitus without complications: Secondary | ICD-10-CM

## 2018-07-15 DIAGNOSIS — L309 Dermatitis, unspecified: Secondary | ICD-10-CM

## 2018-07-15 DIAGNOSIS — B20 Human immunodeficiency virus [HIV] disease: Secondary | ICD-10-CM

## 2018-07-15 DIAGNOSIS — I2699 Other pulmonary embolism without acute cor pulmonale: Principal | ICD-10-CM

## 2018-07-15 DIAGNOSIS — I10 Essential (primary) hypertension: Secondary | ICD-10-CM

## 2018-07-15 DIAGNOSIS — J45909 Unspecified asthma, uncomplicated: Secondary | ICD-10-CM

## 2018-07-15 MED ORDER — DOXYCYCLINE HYCLATE 100 MG PO CAPS: 100.0000 mg | ORAL_CAPSULE | Freq: Two times a day (BID) | ORAL | 0 refills | Status: DC

## 2018-07-15 NOTE — Unmapped (Signed)
Assessment/Plan:     Current warfarin Dose:  7.5mg  daily  Steady State:yes  Date INR: July 15, 2018  Goal INR: 2-3  INR Results: 3.0  Lab Results   Component Value Date    INR 3.0 07/15/2018    INR 2.0 06/17/2018    INR 1.5 05/27/2018    INR 2.60 05/28/2017    INR 2.7 03/30/2015    INR 2.1 02/23/2015       ASSESSMENT/PLAN:  Plan for Warfarin Dose:  continue current dose    1. INR therapeutic, PCP f/u today  2. L 3rd toe injury - recent XR unremarkable, new buddy-tape bandage applied    Repeat INR Date:  4-5 wks    Subject/Objective:     Patient: Ryan Davenport 57 y.o.    Medical Record Number:  [id number]    PRIMARY CARE PHYSICIAN: Mart Piggs, MD    VH:[id number] VTE    PMHx:   Patient Active Problem List   Diagnosis   ??? 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 Allergy to latex   ??? Asthma, chronic (RAF-HCC)   ??? Chronic cardiopulmonary disease (CMS-HCC)   ??? Chronic diastolic heart failure (CMS-HCC)   ??? Cytomegaloviral disease (CMS-HCC)   ??? Dermatitis   ??? Dysplasia of anus   ??? Enthesopathy of knee   ??? Esophageal reflux   ??? Gout   ??? Horseshoe tear of retina   ??? Human immunodeficiency virus (HIV) disease (CMS-HCC)   ??? Essential hypertension   ??? Molluscum contagiosum   ??? Mononeuritis   ??? 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 infarction   ??? Convulsions (CMS-HCC)   ??? Hereditary and idiopathic peripheral neuropathy   ??? Inguinal hernia   ??? Early syphilis, secondary syphilis   ??? Coronary atherosclerosis (RAF-HCC)   ??? Acid reflux (RAF-HCC)   ??? Chronic eczema   ??? Depressive disorder, not elsewhere classified   ??? Allergic rhinitis   ??? Diabetes mellitus, controlled (CMS-HCC)   ??? Diastasis recti   ??? AIDS (CMS-HCC)   ??? Obesity (BMI 30.0-34.9)   ??? Disc disease, degenerative, lumbar or lumbosacral          Your Medication List           Accurate as of July 15, 2018  1:42 PM. If you have any questions, ask your nurse or doctor.               CONTINUE taking these medications    alcohol swabs Padm  Use to check blood sugars once daily. ICD-10 E11.9 (Type 2 DM). Dispense insurance approved     atorvastatin 40 MG tablet  Commonly known as:  LIPITOR  Take 1 tablet (40 mg total) by mouth daily.     betamethasone dipropionate 0.05 % ointment  Commonly known as:  DIPROLENE  Apply topically Two (2) times a day.     betamethasone dipropionate 0.05 % ointment  Commonly known as:  DIPROLENE  Apply topically Two (2) times a day. Apply to problem areas on back     BIKTARVY 50-200-25 mg tablet  Generic drug:  bictegrav-emtricit-tenofov ala  Take 1 tablet by mouth daily at 0600.     blood sugar diagnostic Strp  by Other route once daily. ICD-10 E11.9     blood-glucose meter kit  Use to check blood sugars once daily. ICD-10 E11.9 (Type 2 DM). Dispense insurance approved     cetirizine 10 MG tablet  Commonly known as:  ZyrTEC  Take 1 tablet (10 mg  total) by mouth daily. Take in the morning for itching     citalopram 40 MG tablet  Commonly known as:  CeleXA  Take 1 tablet (40 mg total) by mouth daily.     clobetasol 0.05 % ointment  Commonly known as:  TEMOVATE  Apply topically Two (2) times a day. For stubborn lesions on the feet     colchicine 0.6 mg tablet  Commonly known as:  COLCRYS  Take 0.6 mg by mouth.     dupilumab 300 mg/2 mL Syrg injection  Commonly known as:  DUPIXENT  Inject 600mg  (4ml) subcutaneously on day 0, then 300mg  subcutaneously every 14 days.     fluticasone propionate 50 mcg/actuation nasal spray  Commonly known as:  FLONASE  USE 1 SPRAY IEN D     glipiZIDE 2.5 MG 24 hr tablet  Commonly known as:  GLUCOTROL XL  Take 1 tablet (2.5 mg total) by mouth daily.     hydrOXYzine 25 MG tablet  Commonly known as:  ATARAX  Take 1 tablet (25 mg total) by mouth nightly as needed for itching.     hydrOXYzine 10 MG tablet  Commonly known as:  ATARAX  Take 1 tablet (10 mg total) by mouth nightly.     lisinopril 2.5 MG tablet  Commonly known as:  PRINIVIL,ZESTRIL  TAKE 1 TABLET BY MOUTH EVERY DAY     metFORMIN 500 MG 24 hr tablet  Commonly known as:  GLUCOPHAGE-XR  Take 2 tablets (1,000 mg total) by mouth every morning before breakfast.     nystatin 100,000 unit/mL suspension  Commonly known as:  MYCOSTATIN  Take 5 mL (500,000 Units total) by mouth Four (4) times a day.     triamcinolone 0.1 % ointment  Commonly known as:  KENALOG  Apply twice a day to affected areas     triamcinolone 0.1 % cream  Commonly known as:  KENALOG  APPLY TO THE AFFECTED AREA TWICE DAILY     warfarin 5 MG tablet  Commonly known as:  COUMADIN  7.5mg  (1.5 tabs) daily or as directed              General:  Well appearing, no acute distress    Signs/Symptoms bleeding or bruising: no    Signs/Symptoms of Potential Embolic Events: no    Changes In:   Diet:no   Medications:no   Activities / Lifestyle:no   Health Status:no   Nonadherent: no       Current EtOH Use:no   Current Smoker/tobacco user: no    Medications reviewed including OTC and herbals:yes    The patient set a personal goal for anticoagulation management today:Take warfarin daily as recommended.    Additional History:

## 2018-07-15 NOTE — Unmapped (Signed)
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    Ryan Davenport was seen today for follow-up.    Diagnoses and all orders for this visit:    CAD (s/p MI), diastolic HF.  - Cont Atorvastatin   ??  DM.   - Hgb A1c prev ~6. Today, 7.1. Pt weight also increased by ~ 10 lbs since October. We discussed increasing exercise and incorporating more healthy diet given patient's current weight gain.  Patient feels that with these lifestyle modifications we may be able to get his A1c back to goal of less than 7.  We discussed following up in 3 months to recheck A1c prior to titration of medicines.   - Continue glipizide, metformin  -  lisinopril 2.5mg , patient has had evidence of microalbuminuria in the past  ??  Gout: Patient seen in the emergency department in the last few months for gout flare involving his right ankle.  He was given prednisone, colchicine.  On exam today there is no erythema, swelling, tenderness in any of the joints.  The prior flare looks to be resolved.  - Continue colchicine  ??  HTN. BP  today at goal. Has had recent labs, stable   - Continue statin  - lisinopril 2.5 mg daily    ??HIV.  Last CD4 count 550 and undetectable HIV RNA in 02/2018. Follows in with ID clinic in [place]. Recently treated with CTX/Azithro for oral gonorrhea in 02/2018.   - Continue tivicay, descovvy    Syphilis: Patient recently with positive RPR titer checked by his infectious disease doctor.  Patient reports a allergy to penicillin though does state that he has received penicillin shots in the past for diagnosis of syphilis.  Patient states that he is currently on a course of doxycycline prescribed by his infectious disease doctor and will be following up on July 30 for reassessment.  Patient states that he is not currently in any romantic relationship and is not sexually active.  Patient states that he was tested for syphilis after a one night stand that occurred approximately 1 month ago.  We discussed safe sex practices today especially given his diagnosis of syphilis and refraining from sexual activity until he can be reassessed by his infectious disease doctor and completing treatment.  ??  H/o DVT/PE.  - Continue warfarin  - INR today 3, followed in coag clinic  ??  Dermatitis: Followed by Vaughan Sine .  - Cont dupilumab  - Recently started on clobetasol to recalcitrant plaques, betamethasone in place of TAC, zyrtec 10mg  qPM  - Cont atarax 10mg  nightly  ??  Depression. Reports mood is good today.  - Continue celexa 40   ??  HCM.  - UTD on all vaccines  - Colonoscopy 10/2013 WNL    Follow-up in 3 months to recheck A1c.    __________________________________________________________    HPI:    Patient states that he is doing quite well today.  He did stop his toe on the left foot a few days ago.  He states that he was seen in the emergency department at [facility] and had a negative x-ray.  He is currently treating his pain with Tylenol.  Patient does state that he gets chronic numbness and tingling in his lower extremities which is chronic and been  going on for years.  We initially discussed if trying a medication for symptomatic relief would be a good idea but patient at this time feels he would like to hold off on adding any other medications to his regimen, his symptoms are not particularly distressing.     Otherwise patient states that he was recently seen in infectious disease clinic.  He states that he did have a one night stand prior to this visit and while not having any particular symptoms at that time his infectious disease doctor decided to recheck some STD labs including an RPR for syphilis.  This test on the right being positive.  He denies any other sexual encounters since that nor is he in any monogamous romantic relationship at this time. Patient states that he was told he had a history of penicillin allergy in the past and therefore is currently completing a course of doxycycline and he will follow-up with his infectious disease doctor on July 30.     Otherwise patient denies any shortness of breath, chest pain, abdominal pain, nausea, vomiting.  Appetite is good.  We did discuss that he seems to have gained approximately 10 pounds over the last several months.  Patient does state that he is currently in the process of finding any gym and plans to begin exercising and altering his diet to avoid fast food and begin eating more healthy in an attempt to lose approximately 5 to 10 pounds.    Review of Systems   All other systems reviewed and are negative.    __________________________________________________________    Problem List:  Patient Active Problem List   Diagnosis   ??? 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 Allergy to latex   ??? Asthma, chronic (RAF-HCC)   ??? Chronic cardiopulmonary disease (CMS-HCC)   ??? Chronic diastolic heart failure (CMS-HCC)   ??? Cytomegaloviral disease (CMS-HCC)   ??? Dermatitis   ??? Dysplasia of anus   ??? Enthesopathy of knee   ??? Esophageal reflux   ??? Gout   ??? Horseshoe tear of retina   ??? Human immunodeficiency virus (HIV) disease (CMS-HCC)   ??? Essential hypertension   ??? Molluscum contagiosum   ??? Mononeuritis   ??? 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 infarction   ??? Convulsions (CMS-HCC)   ??? Hereditary and idiopathic peripheral neuropathy   ??? Inguinal hernia   ??? Early syphilis, secondary syphilis   ??? Coronary atherosclerosis (RAF-HCC)   ??? Acid reflux (RAF-HCC)   ??? Chronic eczema   ??? Depressive disorder, not elsewhere classified   ??? Allergic rhinitis   ??? Diabetes mellitus, controlled (CMS-HCC)   ??? Diastasis recti   ??? AIDS (CMS-HCC)   ??? Obesity (BMI 30.0-34.9)   ??? Disc disease, degenerative, lumbar or lumbosacral       Medications:  Reviewed in EPIC  __________________________________________________________    Physical Exam: Vital Signs:  Vitals:    07/15/18 1433   BP: 128/83   Pulse: 105   Temp: 36.5 ??C   TempSrc: Oral   SpO2: 94%   Weight: (!) 113.4 kg (250 lb)   Height: 182.9 cm (6')       Gen: Well appearing, NAD  CV: RRR, no murmurs  Pulm: CTA bilaterally, no crackles or wheezes  Abd: Soft, NTND, normal BS. No HSM.  Ext: No edema, Left 3/4th toes on left foot wrapped/taped together, no obvious swelling or erythema noted.

## 2018-07-15 NOTE — Unmapped (Addendum)
Pleasure meeting you in clinic today!     Continue to take all your medicines as prescribed, and continue to follow up with your infectious doctor.     Lets see you back in clinic in 3 months for another routine follow up and to check in on your diabetes!

## 2018-07-15 NOTE — Telephone Encounter (Signed)
We are going with doxycycline 100 mill grams twice daily for 28 days

## 2018-07-15 NOTE — Telephone Encounter (Signed)
Per verbal order from Dr. Daiva [place]vesVan [place] Doxycycline 28 caps have been sent into pt's pharmacy since pt is unable to receive bicillin due to allergic reaction. PT [facility] [place] able to send this medication electronically through Epic. PT is aware that the medication was sent and will call if he has any questions.  Rx: Doxycycline take one caps twice daily.   Craig [name], [place]

## 2018-07-17 MED ORDER — COLCHICINE 0.6 MG TABLET
ORAL_TABLET | Freq: Every day | ORAL | 2 refills | 0.00000 days | Status: CP
Start: 2018-07-17 — End: 2018-10-16

## 2018-07-17 NOTE — Unmapped (Signed)
I reviewed with the resident the medical history and the resident???s findings on physical examination.  I discussed with the resident the patient???s diagnosis and concur with the treatment plan as documented in the resident note. Saunders Revel, MD

## 2018-07-17 NOTE — Unmapped (Signed)
Please eprescribe teed up medication to pharmacy on encounter.  Please let me know if I can do anything to help you.  Thank you.

## 2018-07-21 ENCOUNTER — Encounter: Payer: Self-pay | Admitting: Infectious Disease

## 2018-07-22 ENCOUNTER — Encounter: Payer: Self-pay | Admitting: Family

## 2018-07-22 ENCOUNTER — Ambulatory Visit (INDEPENDENT_AMBULATORY_CARE_PROVIDER_SITE_OTHER): Payer: Medicare Other | Admitting: Family

## 2018-07-22 VITALS — BP 132/88 | HR 102 | Temp 97.3°F | Ht 72.0 in | Wt 249.0 lb

## 2018-07-22 DIAGNOSIS — B029 Zoster without complications: Secondary | ICD-10-CM

## 2018-07-22 DIAGNOSIS — B20 Human immunodeficiency virus [HIV] disease: Secondary | ICD-10-CM | POA: Diagnosis not present

## 2018-07-22 MED ORDER — VALACYCLOVIR HCL 1 G PO TABS: ORAL_TABLET | ORAL | 0 refills | Status: DC

## 2018-07-22 MED ORDER — ZOSTER VAC RECOMB ADJUVANTED 50 MCG/0.5ML IM SUSR: 0.5000 mL | Freq: Once | INTRAMUSCULAR | 1 refills | Status: AC

## 2018-07-22 MED FILL — DUPIXENT (ML)/300MG/2ML/SOSY: DUPIXENT (ML)/300MG/2ML/SOSY | 28 days supply | Qty: 4 | Fill #6

## 2018-07-22 NOTE — Assessment & Plan Note (Signed)
[name] appears to have well-controlled HIV disease with his current medication regimen and no adverse side effects. He is adherent to his medications with no missed dosages. There are no signs/symptoms of opportunistic infection/HIV disease progression through history or physical exam. Continue current dosage of Biktarvy. All of his immunizations are up to date per recommendations. Plan for follow-up in 6 months or sooner if needed with blood work 1-2 months prior to appointment.

## 2018-07-22 NOTE — Assessment & Plan Note (Signed)
[name]'s rash and signs/symptoms were consistent with herpes zoster. Start valacyclovir. Given written prescription for Shingrix vaccination when available. Discussed possibility of worsening rash and potential pain. Advised to follow up if symptoms worsen or do not improve.

## 2018-07-22 NOTE — Progress Notes (Signed)
Subjective:    Patient ID: Craig [name], male    DOB: 29-Dec-1960, 57 y.o.   MRN: [id number]  Chief Complaint  Patient presents with  . Follow-up  . Rash    upper back     HPI:  Craig [name] is a 57 y.o. male who presents today for routine follow up of HIV disease.   Semaje Kinker was last seen in the office on 03/10/18 by Dr. Daiva Eves. That time he was noted to have virologic suppression and well maintained on Biktarvy. Most recent blood work completed on 07/07/18 with a viral load that was undetectable and a CD4 count of 780. RPR titer was noted to be 1:32 with previous RPR being non-reactive.  Kidney function, liver function, and electrolytes all within the normal ranges. He was started on doxycycline for syphilis secondary to allergies to penicillin.  Mr. Sorbo is taking his medication as prescribed and denies adverse side effects. He has had no missed dosages and has no problems obtaining or taking his medications. He continues to be covered by [employer]. Denies fevers, chills, night sweats, headaches, changes in vision, neck pain/stiffness, nausea, diarrhea, vomiting, or lesions.   He does have a new onset rash located on his back that started about 3 days ago. Has noted pain that wraps around from his back towards his sternum. Pain is described as occasionally sharp. There are no modifying factors or attempted treatments. He does have a significant history for shingles.     Immunization History  Administered Date(s) Administered  . Hepatitis A 08/21/2012  . Hepatitis A, Adult 04/21/2012, 08/21/2012  . Influenza, Seasonal, Injecte, Preservative Fre 11/13/2006, 10/13/2007, 09/12/2009, 11/15/2010, 01/24/2012, 10/29/2012  . Influenza,inj,Quad PF,6+ Mos 09/28/2013, 09/06/2014, 09/15/2016, 12/30/2017  . Influenza,inj,Quad PF,6-35 Mos 09/28/2013  . Influenza-Unspecified 10/29/2012, 09/29/2015  . Meningococcal Conjugate 01/01/2017  . Meningococcal Mcv4o 01/01/2017  . PPD  Test 10/13/2007, 11/02/2008, 04/19/2010, 06/06/2011, 07/06/2011  . Pneumococcal Conjugate-13 09/12/2009  . Pneumococcal Polysaccharide-23 09/12/2009, 04/17/2014, 01/25/2016  . Tdap 03/08/2014      Allergies  Allergen Reactions  . Penicillins Hives and Rash    Has patient had a PCN reaction causing immediate rash, facial/tongue/throat swelling, SOB or lightheadedness with hypotension:YES Has patient had a PCN reaction causing Has patient had a PCN reaction that required hospitalization NO Has patient had a PCN reaction occurring within the last 10 years: YES If all of the above answers are "NO", then may proceed with Cephalosporin use.  . Shellfish Allergy Shortness Of Breath  . Latex Rash      Outpatient Medications Prior to Visit  Medication Sig Dispense Refill  . ACCU-CHEK AVIVA PLUS test strip     . Alcohol Swabs (ALCOHOL PREP) 70 % PADS     . ASSURE COMFORT LANCETS 30G MISC     . atorvastatin (LIPITOR) 40 MG tablet Take 40 mg by mouth daily.    . bictegravir-emtricitabine-tenofovir AF (BIKTARVY) 50-200-25 MG TABS tablet Take 1 tablet by mouth daily. 30 tablet 11  . citalopram (CELEXA) 40 MG tablet Take 40 mg by mouth daily.    . clobetasol ointment (TEMOVATE) 0.05 % Apply 1 application topically 2 (two) times daily as needed (rash).     . colchicine 0.6 MG tablet Take 1 tablet (0.6 mg total) by mouth daily. 2 tablet 0  . Dextromethorphan-Guaifenesin (ROBITUSSIN DM PO) Take by mouth.    . diclofenac sodium (VOLTAREN) 1 % GEL Apply 2 g topically 4 (four) times daily. 100 g 0  .  doxycycline (VIBRAMYCIN) 100 MG capsule Take 1 capsule (100 mg total) by mouth 2 (two) times daily. 28 capsule 0  . folic acid (FOLVITE) 1 MG tablet Take 4 mg by mouth See admin instructions. Take everyday except on Friday.    Marland Kitchen glipiZIDE (GLUCOTROL XL) 2.5 MG 24 hr tablet Take 2.5 mg by mouth.    Marland Kitchen HYDROcodone-homatropine (HYCODAN) 5-1.5 MG/5ML syrup Take 5 mLs by mouth every 6 (six) hours as needed for  cough. 60 mL 0  . hydrOXYzine (ATARAX/VISTARIL) 25 MG tablet Take 25 mg by mouth every 6 (six) hours as needed for anxiety.     Marland Kitchen lisinopril (PRINIVIL,ZESTRIL) 2.5 MG tablet Take 2.5 mg by mouth daily.    . metFORMIN (GLUCOPHAGE) 500 MG tablet Take 1 tablet (500 mg total) by mouth 2 (two) times daily with a meal. 60 tablet 1  . metFORMIN (GLUCOPHAGE-XR) 500 MG 24 hr tablet TK 2 TS PO Q MORNING BEFORE BREAKFAST  3  . ondansetron (ZOFRAN) 4 MG tablet Take 1 tablet (4 mg total) by mouth every 8 (eight) hours as needed for nausea or vomiting. 60 tablet 0  . warfarin (COUMADIN) 5 MG tablet Take 5-7.5 mg by mouth daily. Take 1.5 (7.5mg ) daily except Sunday take 1 tab    . albuterol (PROVENTIL HFA;VENTOLIN HFA) 108 (90 BASE) MCG/ACT inhaler Inhale 2 puffs into the lungs every 6 (six) hours as needed for wheezing or shortness of breath.    . fluticasone (FLONASE) 50 MCG/ACT nasal spray USE 1 SPRAY EACH NOSTRIL DAILY AS NEEDED FOR ALLERIES  0  . methotrexate (RHEUMATREX) 2.5 MG tablet Take 8 tablets once a week on Friday.  1  . oxyCODONE-acetaminophen (PERCOCET/ROXICET) 5-325 MG tablet Take 2 tablets by mouth every 4 (four) hours as needed for severe pain. (Patient not taking: Reported on 07/22/2018) 15 tablet 0  . Phenylephrine-Guaifenesin (MUCUS RELIEF D PO) Take by mouth.    . predniSONE (DELTASONE) 50 MG tablet As directed (Patient not taking: Reported on 07/22/2018) 5 tablet 0  . terbinafine (LAMISIL) 250 MG tablet     . triamcinolone ointment (KENALOG) 0.1 % Apply 1 application topically 2 (two) times daily as needed (for itching). On Face  0  . valACYclovir (VALTREX) 1000 MG tablet TK 1 T PO TID FOR 7 DAYS  0   No facility-administered medications prior to visit.      Past Medical History:  Diagnosis Date  . AIDS (HCC) 05/24/2015  . Allergic rhinitis 01/25/2016  . Atopic dermatitis 12/30/2017  . 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 Deep vein thrombosis (HCC) 05/09/2011   Overview:  Jan 2012   . Diabetes mellitus, controlled (HCC) 01/25/2016  . Dysplasia of anus   . Esophageal reflux   . 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 Hernia, inguinal 04/09/2013  . HIV (human immunodeficiency virus infection) (HCC)   . Horseshoe tear of retina without detachment 02-13-12  . 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  . Molluscum contagiosum 09-29-2012  . 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 infarction   . Other convulsions   . PE (pulmonary embolism) 04/09/2013   Overview:  Jan 2012   . Personal history of venous thrombosis and embolism   . Secondary iridocyclitis, infectious   . Septic arthritis of wrist (HCC)    rt  . Shortness of breath dyspnea   . Unspecified hereditary and idiopathic peripheral neuropathy   . 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 Viral URI 03/10/2018  Past Surgical History:  Procedure Laterality Date  . COLONOSCOPY    . I&D EXTREMITY Right 07/28/2015   Procedure: IRRIGATION AND DEBRIDEMENT WRIST;  Surgeon: Knute Neu, MD;  Location: [facility] OR;  Service: Plastics;  Laterality: Right;    Review of Systems  Constitutional: Negative for appetite change, chills, fatigue, fever and unexpected weight change.  Eyes: Negative for visual disturbance.  Respiratory: Negative for cough, chest tightness, shortness of breath and wheezing.   Cardiovascular: Negative for chest pain and leg swelling.  Gastrointestinal: Negative for abdominal pain, constipation, diarrhea, nausea and vomiting.  Genitourinary: Negative for dysuria, flank pain, frequency, genital sores, hematuria and urgency.  Skin: Positive for rash.  Allergic/Immunologic: Negative for immunocompromised state.  Neurological: Negative for dizziness and headaches.      Objective:    BP 132/88   Pulse (!) 102   Temp (!) 97.3 F (36.3 C) (Oral)   Ht 6' (1.829 m)   Wt 249 lb (112.9 kg)   BMI 33.77 kg/m  Nursing note and vital signs  reviewed.  Physical Exam  Constitutional: He is oriented to person, place, and time. He appears well-developed. No distress.  HENT:  Mouth/Throat: Oropharynx is clear and moist.  Eyes: Conjunctivae are normal.  Neck: Neck supple.  Cardiovascular: Normal rate, regular rhythm, normal heart sounds and intact distal pulses. Exam reveals no gallop and no friction rub.  No murmur heard. Pulmonary/Chest: Effort normal and breath sounds normal. No respiratory distress. He has no wheezes. He has no rales. He exhibits no tenderness.  Abdominal: Soft. Bowel sounds are normal. There is no tenderness.  Lymphadenopathy:    He has no cervical adenopathy.  Neurological: He is alert and oriented to person, place, and time.  Skin: Skin is warm and dry. Rash noted.  New onset rash located on his thoracic spine on the right side which appears with clustered vesicles and is mildly tender to the touch.   Psychiatric: He has a normal mood and affect. His behavior is normal. Judgment and thought content normal.       Assessment & Plan:   Problem List Items Addressed This Visit      Other   HIV disease (HCC) - Primary    Mr. Seiden appears to have well-controlled HIV disease with his current medication regimen and no adverse side effects. He is adherent to his medications with no missed dosages. There are no signs/symptoms of opportunistic infection/HIV disease progression through history or physical exam. Continue current dosage of Biktarvy. All of his immunizations are up to date per recommendations. Plan for follow-up in 6 months or sooner if needed with blood work 1-2 months prior to appointment.      Relevant Medications   valACYclovir (VALTREX) 1000 MG tablet   Zoster Vaccine Adjuvanted [facility]) injection   Other Relevant Orders   T-helper cell (CD4)- ([facility] clinic only)   HIV 1 RNA quant-no reflex-bld   CBC   Comprehensive metabolic panel   Lipid panel   RPR   Herpes zoster without  complication    Mr. Vi rash and signs/symptoms were consistent with herpes zoster. Start valacyclovir. Given written prescription for Shingrix vaccination when available. Discussed possibility of worsening rash and potential pain. Advised to follow up if symptoms worsen or do not improve.       Relevant Medications   valACYclovir (VALTREX) 1000 MG tablet   Zoster Vaccine Adjuvanted [facility]) injection       I have changed Ihan Edrington's valACYclovir. I am also having  him start on Zoster Vaccine Adjuvanted. Additionally, I am having him maintain his hydrOXYzine, albuterol, citalopram, warfarin, clobetasol ointment, triamcinolone ointment, metFORMIN, glipiZIDE, fluticasone, methotrexate, folic acid, lisinopril, atorvastatin, oxyCODONE-acetaminophen, Alcohol Prep, ACCU-CHEK AVIVA PLUS, ASSURE COMFORT LANCETS 30G, metFORMIN, terbinafine, Dextromethorphan-Guaifenesin (ROBITUSSIN DM PO), Phenylephrine-Guaifenesin (MUCUS RELIEF D PO), HYDROcodone-homatropine, bictegravir-emtricitabine-tenofovir AF, ondansetron, predniSONE, colchicine, diclofenac sodium, and doxycycline.   Meds ordered this encounter  Medications  . valACYclovir (VALTREX) 1000 MG tablet    Sig: Take 1 tablet by mouth 3 times daily for 7 days.    Dispense:  21 tablet    Refill:  0    Order Specific Question:   Supervising Provider    Answer:   Judyann MunsonSNIDER, CYNTHIA [[id number]]  . Zoster Vaccine Adjuvanted [facility](SHINGRIX) injection    Sig: Inject 0.5 mLs into the muscle once for 1 dose. Repeat in 2-6 months after first injection.    Dispense:  0.5 mL    Refill:  1    Order Specific Question:   Supervising Provider    Answer:   Judyann MunsonSNIDER, CYNTHIA [[id number]]     Follow-up: No follow-ups on file.   Marcos EkeGreg Charlesa Ehle, MSN, FNP-C Nurse Practitioner [facility] Office phone: [phone] Pager: [phone] [facility] Main number: [phone]

## 2018-07-22 NOTE — Patient Instructions (Signed)
Nice to meet you.  Continue to take the doxycycline and START taking Valtrex.  Continue to take your Biktarvy.  Plan for follow up in 6 months or sooner if needed with blood work 1-2 weeks prior to appointment.

## 2018-07-28 MED ORDER — ATORVASTATIN 40 MG TABLET
ORAL_TABLET | Freq: Every day | ORAL | 3 refills | 0 days | Status: CP
Start: 2018-07-28 — End: 2019-07-28

## 2018-07-28 MED ORDER — TRIAMCINOLONE ACETONIDE 0.1 % TOPICAL CREAM
INTRAMUSCULAR | 0 refills | 0.00000 days | Status: CP
Start: 2018-07-28 — End: 2018-11-24

## 2018-08-12 ENCOUNTER — Encounter: Admit: 2018-08-12 | Discharge: 2018-08-12 | Payer: MEDICARE

## 2018-08-12 DIAGNOSIS — E119 Type 2 diabetes mellitus without complications: Secondary | ICD-10-CM

## 2018-08-12 DIAGNOSIS — I2699 Other pulmonary embolism without acute cor pulmonale: Principal | ICD-10-CM

## 2018-08-12 NOTE — Unmapped (Signed)
1.7/21.0

## 2018-08-12 NOTE — Unmapped (Signed)
Assessment/Plan:     Current warfarin Dose:  7.5mg  daily  Steady State:no  Date INR: August 12, 2018  Goal INR: 2-3  INR Results: 1.7  Lab Results   Component Value Date    INR 3.0 07/15/2018    INR 2.0 06/17/2018    INR 1.5 05/27/2018    INR 2.60 05/28/2017    INR 2.7 03/30/2015    INR 2.1 02/23/2015       ASSESSMENT/PLAN:  Plan for Warfarin Dose:  continue current dose    1. INR subtherapeutic, completed doxycycline approx 1 wk ago.  H/o therapeutic INR on current regimen not on doxycycline, consider titration if not therapeutic next visit  2. DM2 - having trouble checking blood sugars daily.  Current a1c controlled at 7.1 in 7/19.  Continue mfm XR 1g daily and glipizide XL 2.5mg  daily.  Check BG q2-3 days, monitoring for hypos.  Continue statin, ACEI.  No ASA 2/2 anticoagulation.  Due for foot exam, plan to obtain next visit    Repeat INR Date:  4 wks    Subject/Objective:     Patient: Ryan Davenport 57 y.o.    Medical Record Number:  [id number]    PRIMARY CARE PHYSICIAN: Mart Piggs, MD    NW:GNFAOZHY VTE, DM    PMHx:   Patient Active Problem List   Diagnosis   ??? 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 Allergy to latex   ??? Asthma, chronic (RAF-HCC)   ??? Chronic cardiopulmonary disease (CMS-HCC)   ??? Chronic diastolic heart failure (CMS-HCC)   ??? Cytomegaloviral disease (CMS-HCC)   ??? Dermatitis   ??? Dysplasia of anus   ??? Enthesopathy of knee   ??? Esophageal reflux   ??? Gout   ??? Horseshoe tear of retina   ??? Human immunodeficiency virus (HIV) disease (CMS-HCC)   ??? Essential hypertension   ??? Molluscum contagiosum   ??? Mononeuritis   ??? 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 infarction   ??? Convulsions (CMS-HCC)   ??? Hereditary and idiopathic peripheral neuropathy   ??? Inguinal hernia   ??? Early syphilis, secondary syphilis   ??? Coronary atherosclerosis (RAF-HCC)   ??? Acid reflux (RAF-HCC)   ??? Chronic eczema   ??? Depressive disorder, not elsewhere classified   ??? Allergic rhinitis   ??? Diabetes mellitus, controlled (CMS-HCC)   ??? Diastasis recti   ??? AIDS (CMS-HCC)   ??? Obesity (BMI 30.0-34.9)   ??? Disc disease, degenerative, lumbar or lumbosacral          Your Medication List           Accurate as of August 12, 2018  2:22 PM. If you have any questions, ask your nurse or doctor.               CONTINUE taking these medications    alcohol swabs Padm  Use to check blood sugars once daily. ICD-10 E11.9 (Type 2 DM). Dispense insurance approved     atorvastatin 40 MG tablet  Commonly known as:  LIPITOR  Take 1 tablet (40 mg total) by mouth daily.     betamethasone dipropionate 0.05 % ointment  Commonly known as:  DIPROLENE  Apply topically Two (2) times a day.     betamethasone dipropionate 0.05 % ointment  Commonly known as:  DIPROLENE  Apply topically Two (2) times a day. Apply to problem areas on back     BIKTARVY 50-200-25 mg tablet  Generic drug:  bictegrav-emtricit-tenofov ala  Take 1 tablet by  mouth daily at 0600.     blood sugar diagnostic Strp  by Other route once daily. ICD-10 E11.9     blood-glucose meter kit  Use to check blood sugars once daily. ICD-10 E11.9 (Type 2 DM). Dispense insurance approved     cetirizine 10 MG tablet  Commonly known as:  ZyrTEC  Take 1 tablet (10 mg total) by mouth daily. Take in the morning for itching     citalopram 40 MG tablet  Commonly known as:  CeleXA  Take 1 tablet (40 mg total) by mouth daily.     clobetasol 0.05 % ointment  Commonly known as:  TEMOVATE  Apply topically Two (2) times a day. For stubborn lesions on the feet     colchicine 0.6 mg tablet  Commonly known as:  COLCRYS  Take 1 tablet (0.6 mg total) by mouth daily.     DUPIXENT 300 mg/2 mL Syrg injection  Generic drug:  dupilumab  INJECT THE CONTENTS OF 2 SYRINGES (600MG ) UNDER THE SKIN ON DAY 0 THEN INJECT THE CONTENTS OF 1 SYRINGE (300MG ) EVERY 14 DAYS     empty container Misc  USE AS DIRECTED     fluticasone propionate 50 mcg/actuation nasal spray  Commonly known as:  FLONASE  USE 1 SPRAY IEN D     glipiZIDE 2.5 MG 24 hr tablet  Commonly known as:  GLUCOTROL XL  Take 1 tablet (2.5 mg total) by mouth daily.     hydrOXYzine 25 MG tablet  Commonly known as:  ATARAX  Take 1 tablet (25 mg total) by mouth nightly as needed for itching.     hydrOXYzine 10 MG tablet  Commonly known as:  ATARAX  Take 1 tablet (10 mg total) by mouth nightly.     lisinopril 2.5 MG tablet  Commonly known as:  PRINIVIL,ZESTRIL  TAKE 1 TABLET BY MOUTH EVERY DAY     metFORMIN 500 MG 24 hr tablet  Commonly known as:  GLUCOPHAGE-XR  Take 2 tablets (1,000 mg total) by mouth every morning before breakfast.     nystatin 100,000 unit/mL suspension  Commonly known as:  MYCOSTATIN  Take 5 mL (500,000 Units total) by mouth Four (4) times a day.     triamcinolone 0.1 % ointment  Commonly known as:  KENALOG  Apply twice a day to affected areas     triamcinolone 0.1 % cream  Commonly known as:  KENALOG  APPLY TO THE AFFECTED AREA TWICE DAILY     warfarin 5 MG tablet  Commonly known as:  COUMADIN  7.5mg  (1.5 tabs) daily or as directed              General:  Well appearing, no acute distress    Signs/Symptoms bleeding or bruising: no    Signs/Symptoms of Potential Embolic Events: no    Changes In:   Diet:no   Medications:yes   Activities / Lifestyle:no   Health Status:no   Nonadherent: no       Current EtOH Use:no   Current Smoker/tobacco user: no    Medications reviewed including OTC and herbals:yes    The patient set a personal goal for anticoagulation management today:Take warfarin daily as recommended.    Additional History:

## 2018-08-12 NOTE — Unmapped (Signed)
**  Only check blood sugars about once every 2-3 days, monitoring for blood sugars below 70

## 2018-08-21 NOTE — Unmapped (Signed)
[facility] Refill and Clinical Coordination Note  Medication(s): Dupixent 300mg /24ml    Chey Alexander Snooks, DOB: 05-10-1961  Phone: [phone] (home) , Alternate phone contact: N/A  Shipping address: [address]  [address] [name] [address]  Phone or address changes today?: No  All above HIPAA information verified.  Insurance changes? No    Completed refill and clinical call assessment today to schedule patient's medication shipment from the [facility] [phone]).      MEDICATION RECONCILIATION    Confirmed the medication and dosage are correct and have not changed: Yes, regimen is correct and unchanged.    Were there any changes to your medication(s) in the past month:  No, there are no changes reported at this time.    ADHERENCE    Is this medicine transplant or covered by Medicare Part B? No.    Did you miss any doses in the past 4 weeks? No missed doses reported.  Adherence counseling provided? Not needed     SIDE EFFECT MANAGEMENT    Are you tolerating your medication?:  Tayler reports tolerating the medication.  Side effect management discussed: None      Therapy is appropriate and should be continued.    Evidence of clinical benefit: See Epic note from 07/02/18      FINANCIAL/SHIPPING    Delivery Scheduled: Yes, Expected medication delivery date: 09/04/18     Additional medications refilled: No additional medications/refills needed at this time.    The patient will receive a drug information handout for each medication shipped and additional FDA Medication Guides as required.      Harrell did not have any additional questions at this time.    Delivery address confirmed in Epic.     We will follow up with patient monthly for standard refill processing and delivery.      Thank you,  Lupita Shutter   [facility] Specialty Pharmacist

## 2018-09-03 MED FILL — DUPIXENT 300 MG/2 ML SUBCUTANEOUS SYRINGE: 28 days supply | Qty: 4 | Fill #0

## 2018-09-03 MED FILL — DUPIXENT 300 MG/2 ML SUBCUTANEOUS SYRINGE: 28 days supply | Qty: 4 | Fill #0 | Status: AC

## 2018-09-11 ENCOUNTER — Encounter: Admit: 2018-09-11 | Discharge: 2018-09-12 | Payer: MEDICARE

## 2018-09-11 DIAGNOSIS — Z23 Encounter for immunization: Principal | ICD-10-CM

## 2018-09-11 DIAGNOSIS — I2699 Other pulmonary embolism without acute cor pulmonale: Secondary | ICD-10-CM

## 2018-09-11 DIAGNOSIS — E1140 Type 2 diabetes mellitus with diabetic neuropathy, unspecified: Secondary | ICD-10-CM

## 2018-09-11 NOTE — Unmapped (Signed)
Assessment/Plan:     Current warfarin Dose:  7.5mg  daily  Steady State:yes  Date INR: September 11, 2018  Goal INR: 2-3  INR Results: 3.2  Lab Results   Component Value Date    INR 1.7 08/12/2018    INR 3.0 07/15/2018    INR 2.0 06/17/2018    INR 2.60 05/28/2017    INR 2.7 03/30/2015    INR 2.1 02/23/2015       ASSESSMENT/PLAN:  Plan for Warfarin Dose:  continue current dose    1. INR at [facility] at upper limit of acceptable range vs supratherapeutic.  H/o stability on current regimen, maintain customary f/u interval over sooner f/u.  2. DM2 - 8/8 monofilament exam today per nursing staff    Repeat INR Date:  10/23, same day as PCP    Subject/Objective:     Patient: Ryan Davenport 57 y.o.    Medical Record Number:  [id number]    PRIMARY CARE PHYSICIAN: Mart Piggs, MD    XB:JYNWGNFA VTE, DM    PMHx:   Patient Active Problem List   Diagnosis   ??? 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 Allergy to latex   ??? Asthma, chronic (RAF-HCC)   ??? Chronic cardiopulmonary disease (CMS-HCC)   ??? Chronic diastolic heart failure (CMS-HCC)   ??? Cytomegaloviral disease (CMS-HCC)   ??? Dermatitis   ??? Dysplasia of anus   ??? Enthesopathy of knee   ??? Esophageal reflux   ??? Gout   ??? Horseshoe tear of retina   ??? Human immunodeficiency virus (HIV) disease (CMS-HCC)   ??? Essential hypertension   ??? Molluscum contagiosum   ??? Mononeuritis   ??? 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 infarction   ??? Convulsions (CMS-HCC)   ??? Hereditary and idiopathic peripheral neuropathy   ??? Inguinal hernia   ??? Early syphilis, secondary syphilis   ??? Coronary atherosclerosis (RAF-HCC)   ??? Acid reflux (RAF-HCC)   ??? Chronic eczema   ??? Depressive disorder, not elsewhere classified   ??? Allergic rhinitis   ??? Diabetes mellitus, controlled (CMS-HCC)   ??? Diastasis recti   ??? AIDS (CMS-HCC)   ??? Obesity (BMI 30.0-34.9)   ??? Disc disease, degenerative, lumbar or lumbosacral          Your Medication List           Accurate as of September 11, 2018 10:41 AM. If you have any questions, ask your nurse or doctor.               CONTINUE taking these medications    alcohol swabs Padm  Use to check blood sugars once daily. ICD-10 E11.9 (Type 2 DM). Dispense insurance approved     atorvastatin 40 MG tablet  Commonly known as:  LIPITOR  Take 1 tablet (40 mg total) by mouth daily.     betamethasone dipropionate 0.05 % ointment  Commonly known as:  DIPROLENE  Apply topically Two (2) times a day.     betamethasone dipropionate 0.05 % ointment  Commonly known as:  DIPROLENE  Apply topically Two (2) times a day. Apply to problem areas on back     BIKTARVY 50-200-25 mg tablet  Generic drug:  bictegrav-emtricit-tenofov ala  Take 1 tablet by mouth daily at 0600.     blood sugar diagnostic Strp  by Other route once daily. ICD-10 E11.9     blood-glucose meter kit  Use to check blood sugars once daily. ICD-10 E11.9 (Type 2 DM). Dispense insurance approved  cetirizine 10 MG tablet  Commonly known as:  ZyrTEC  Take 1 tablet (10 mg total) by mouth daily. Take in the morning for itching     citalopram 40 MG tablet  Commonly known as:  CeleXA  Take 1 tablet (40 mg total) by mouth daily.     clobetasol 0.05 % ointment  Commonly known as:  TEMOVATE  Apply topically Two (2) times a day. For stubborn lesions on the feet     colchicine 0.6 mg tablet  Commonly known as:  COLCRYS  Take 1 tablet (0.6 mg total) by mouth daily.     DUPIXENT 300 mg/2 mL Syrg injection  Generic drug:  dupilumab  INJECT THE CONTENTS OF 2 SYRINGES (600MG ) UNDER THE SKIN ON DAY 0 THEN INJECT THE CONTENTS OF 1 SYRINGE (300MG ) EVERY 14 DAYS     empty container Misc  USE AS DIRECTED     fluticasone propionate 50 mcg/actuation nasal spray  Commonly known as:  FLONASE  USE 1 SPRAY IEN D     glipiZIDE 2.5 MG 24 hr tablet  Commonly known as:  GLUCOTROL XL  Take 1 tablet (2.5 mg total) by mouth daily.     hydrOXYzine 25 MG tablet  Commonly known as:  ATARAX  Take 1 tablet (25 mg total) by mouth nightly as needed for itching.     hydrOXYzine 10 MG tablet  Commonly known as:  ATARAX  Take 1 tablet (10 mg total) by mouth nightly.     lisinopril 2.5 MG tablet  Commonly known as:  PRINIVIL,ZESTRIL  TAKE 1 TABLET BY MOUTH EVERY DAY     metFORMIN 500 MG 24 hr tablet  Commonly known as:  GLUCOPHAGE-XR  Take 2 tablets (1,000 mg total) by mouth every morning before breakfast.     nystatin 100,000 unit/mL suspension  Commonly known as:  MYCOSTATIN  Take 5 mL (500,000 Units total) by mouth Four (4) times a day.     triamcinolone 0.1 % ointment  Commonly known as:  KENALOG  Apply twice a day to affected areas     triamcinolone 0.1 % cream  Commonly known as:  KENALOG  APPLY TO THE AFFECTED AREA TWICE DAILY     warfarin 5 MG tablet  Commonly known as:  COUMADIN  7.5mg  (1.5 tabs) daily or as directed              General:  Well appearing, no acute distress    Signs/Symptoms bleeding or bruising: no    Signs/Symptoms of Potential Embolic Events: no    Changes In:   Diet:no   Medications:no   Activities / Lifestyle:no   Health Status:no   Nonadherent: no       Current EtOH Use:no   Current Smoker/tobacco user: no    Medications reviewed including OTC and herbals:yes    The patient set a personal goal for anticoagulation management today:Take warfarin daily as recommended.    Additional History:

## 2018-09-11 NOTE — Unmapped (Signed)
Foot exam completed    influenza virus vaccine (injection)  Pronunciation:  in floo ENZ a VYE rus VAK seen  Brand:  Afluria 2018-2019, Afluria PF Quadrivalent 2018-2019, Afluria Preservative-Free 2018-2019, Afluria Quadrivalent 2018-2019, Fluad 2018-2019, Fluarix PF Quadrivalent 2018-2019, Flublok 2017-2018, Flublok Quadrivalent 2018-2019, Flucelvax PF Quadrivalent 2018-2019, Flucelvax Quadrivalent 2018-2019, FluLaval PF Quadrivalent 2018-2019, FluLaval Quadrivalent 2018-2019, Fluzone High-Dose 2018-2019, Fluzone PF Quadrivalent 2018-2019, Fluzone Quadrivalent 2018-2019  What is the most important information I should know about this vaccine?  The injectable influenza virus vaccine (flu shot) is a killed virus vaccine. Influenza virus vaccine is also available in a nasal spray form, which is a live virus vaccine. This medication guide addresses only the injectable form of this vaccine.  Becoming infected with influenza is much more dangerous to your health than receiving this vaccine. However, like any medicine, this vaccine can cause side effects but the risk of serious side effects is extremely low.  What is influenza virus vaccine?  Influenza virus (commonly known as the flu) is a serious disease caused by a virus. Influenza virus can spread from one person to another through small droplets of saliva that are expelled into the air when an infected person coughs or sneezes. The virus can also be passed through contact with objects the infected person has touched, such as a door handle or other surfaces.  Influenza virus vaccine is used to prevent infection caused by influenza virus. The vaccine is redeveloped each year to contain specific strains of inactivated (killed) flu virus that are recommended by public health officials for that year.  The injectable influenza virus vaccine (flu shot) is a killed virus vaccine. Influenza virus vaccine is also available in a nasal spray form, which is a live virus vaccine.  Influenza virus vaccine works by exposing you to a small dose of the virus, which helps your body to develop immunity to the disease. Influenza virus vaccine will not treat an active infection that has already developed in the body.  Influenza virus vaccine is for use in adults and children who are at least 6 months old.  Becoming infected with influenza is much more dangerous to your health than receiving this vaccine. Influenza causes thousands of deaths each year, and hundreds of thousands of hospitalizations. However, like any medicine, this vaccine can cause side effects but the risk of serious side effects is extremely low.  Like any vaccine, influenza virus vaccine may not provide protection from disease in every person. This vaccine will not prevent illness caused by avian flu (bird flu).  What should I discuss with my healthcare provider before receiving this vaccine?  You may not be able to receive this vaccine if you are allergic to eggs, or if you have:  a history of severe allergic reaction to a flu vaccine; or  a history of Guillain-Barre syndrome (within 6 weeks after receiving a flu vaccine).  To make sure this vaccine is safe for you, tell your doctor if you have ever had:  a bleeding or blood clotting disorder such as hemophilia or easy bruising;  a neurologic disorder or disease affecting the brain (or if this was a reaction to a previous vaccine);  seizures;  a weak immune system caused by disease, bone marrow transplant, or by using certain medicines or receiving cancer treatments; or  if you are allergic to latex rubber.  You can still receive a vaccine if you have a minor cold. If you have a severe illness with a fever or  any type of infection, wait until you get better before receiving this vaccine.  The Centers for Disease Control and Prevention recommends that pregnant women get a flu shot during any trimester of pregnancy to protect themselves and their newborn babies from flu. The nasal spray form of influenza vaccine is not recommended for use in pregnant women.   It is not known whether influenza virus vaccine passes into breast milk or if it could harm a nursing baby. Do not receive this vaccine without telling your doctor if you are breast-feeding a baby.  This vaccine should not be given to a child younger than 86 months old.  How is this vaccine given?  Some brands of this vaccine are made for use in adults and not in children. Your child's doctor can recommend the best influenza virus vaccine for your child.  This vaccine is given as an injection (shot) into a muscle. You will receive this injection in a doctor's office or other clinic setting.  You should receive a flu vaccine every year. Your immunity will gradually decrease over the 12 months after you receive the influenza virus vaccine. Children receiving this vaccine may need a booster shot one month after receiving the first vaccine.  The influenza virus vaccine is usually given in October or November. Some people may need to have their vaccines earlier or later. Follow your doctor's instructions.  Your doctor may recommend treating fever and pain with an aspirin-free pain reliever such as acetaminophen (Tylenol) or ibuprofen (Motrin, Advil, and others) when the shot is given and for the next 24 hours. Follow the label directions or your doctor's instructions about how much of this medicine to give your child.  It is especially important to prevent fever from occurring in a child who has a seizure disorder such as epilepsy.  What happens if I miss a dose?  Since flu shots are usually given only one time per year, you will most likely not be on a dosing schedule. Call your doctor if you forget to receive your yearly flu shot in October or November.  If your child misses a booster dose of this vaccine, call your doctor for instructions.  What happens if I overdose?  An overdose of this vaccine is unlikely to occur. What should I avoid before or after receiving this vaccine?  Follow your doctor's instructions about any restrictions on food, beverages, or activity.  What are the possible side effects of influenza virus injectable vaccine?  Get emergency medical help if you have signs of an allergic reaction: hives; difficulty breathing; swelling of your face, lips, tongue, or throat.  You should not receive a booster vaccine if you had a life-threatening allergic reaction after the first shot.  Keep track of any and all side effects you have after receiving this vaccine. If you ever need to receive influenza virus vaccine in the future, you will need to tell your doctor if the previous shot caused any side effects.  Influenza virus injectable (killed virus) vaccine will not cause you to become ill with the flu virus that it contains. However, you may have flu-like symptoms at any time during flu season that may be caused by other strains of influenza virus.  Call your doctor at once if you have:  a light-headed feeling, like you might pass out;  severe weakness or unusual feeling in your arms and legs (may occur 2 to 4 weeks after you receive the vaccine);  high fever;  seizure (convulsions); or  unusual bleeding.  Common side effects may include:  low fever, chills;  mild fussiness or crying;  redness, bruising, pain, swelling, or a lump where the vaccine was injected;  headache, tired feeling; or  joint or muscle pain.  This is not a complete list of side effects and others may occur. Call your doctor for medical advice about side effects. You may report vaccine side effects to the [place] Department of Health and Human Services at [phone].  What other drugs will affect influenza virus injectable vaccine?  If you are using any of the following medicines, you may not be able to receive the vaccine, or may need to wait until the other treatments are finished:  phenytoin, theophylline, or warfarin (Coumadin, Jantoven);  an oral, nasal, inhaled, or injectable steroid medicine;  medications to treat psoriasis, rheumatoid arthritis, or other autoimmune disorders--azathioprine, etanercept, leflunomide, and others; or  medicines to treat or prevent organ transplant rejection--basiliximab, cyclosporine, muromonab-CD3, mycophenolate mofetil, sirolimus, tacrolimus.  This list is not complete. Other drugs may interact with influenza virus vaccine, including prescription and over-the-counter medicines, vitamins, and herbal products. Not all possible interactions are listed in this medication guide.  Where can I get more information?  Your doctor or pharmacist can provide more information about this vaccine. Additional information is available from your local health department or the Centers for Disease Control and Prevention.  Remember, keep this and all other medicines out of the reach of children, never share your medicines with others, and use this medication only for the indication prescribed.  Every effort has been made to ensure that the information provided by [employer]') is accurate, up-to-date, and complete, but no guarantee is made to that effect. Drug information contained herein may be time sensitive. [employer] information has been compiled for use by healthcare practitioners and consumers in the [place] and therefore [employer] does not warrant that uses outside of the [place] are appropriate, unless specifically indicated otherwise. [employer]'s drug information does not endorse drugs, diagnose patients or recommend therapy. [employer]'s drug information is an [employer], corporate to assist licensed healthcare practitioners in caring for their patients and/or to serve consumers viewing this service as a supplement to, and not a substitute for, the expertise, skill, knowledge and judgment of healthcare practitioners. The absence of a warning for a given drug or drug combination in no way should be construed to indicate that the drug or drug combination is safe, effective or appropriate for any given patient. [employer] does not assume any responsibility for any aspect of healthcare administered with the aid of information [employer] provides. The information contained herein is not intended to cover all possible uses, directions, precautions, warnings, drug interactions, allergic reactions, or adverse effects. If you have questions about the drugs you are taking, check with your doctor, nurse or pharmacist.  Copyright [id number] [employer] Version: 7.13. Revision date: 08/02/2017.  Care instructions adapted under license by [facility]. If you have questions about a medical condition or this instruction, always ask your healthcare professional. [employer] disclaims any warranty or liability for your use of this information.

## 2018-09-24 NOTE — Unmapped (Signed)
Patient is doing well  He forgets his dose sometimes- suggested he use a calendar to help keep track - he agreed that might help  Asked if he was taking it every 14 days and he said well sometimes every 16 if he forgets but he's trying to get back on track  He is now dosing on sundays    [name]    Specialty Medication(s) to be Shipped:   Inflammatory Disorders: Dupixent    Other medication(s) to be shipped: n/a     Ryan Davenport, DOB: Aug 31, 1961  Phone: [phone] (home)   Shipping Address: [address] [place] [address]    All above HIPAA information was verified with patient.     Completed refill call assessment today to schedule patient's medication shipment from the [facility] [phone]).       Specialty medication(s) and dose(s) confirmed: Regimen is correct and unchanged.   Changes to medications: Ryan Davenport reports no changes reported at this time.  Changes to insurance: No  Questions for the pharmacist: No    The patient will receive a drug information handout for each medication shipped and additional FDA Medication Guides as required.      DISEASE/MEDICATION-SPECIFIC INFORMATION        For Inflammatory disorders patients on injectable medications: Patient currently has 1 doses left.  Next injection is scheduled for next sunday.    ADHERENCE     Medication Adherence    Patient reported X missed doses in the last month:  0  Specialty Medication:  dupixent  Patient is on additional specialty medications:  No  Patient is on more than two specialty medications:  No  Any gaps in refill history greater than 2 weeks in the last 3 months:  no  Demonstrates understanding of importance of adherence:  yes  Informant:  patient  Reliability of informant:  reliable  Confirmed plan for next specialty medication refill:  delivery by pharmacy  Refills needed for supportive medications:  not needed          Refill Coordination    Has the Patients' Contact Information Changed:  No  Is the Shipping Address Different:  No           SHIPPING     Shipping address confirmed in Epic.     Delivery Scheduled: Yes, Expected medication delivery date: 10/9 via UPS or courier.     [name]   [facility] Specialty Technician

## 2018-09-30 MED FILL — DUPIXENT 300 MG/2 ML SUBCUTANEOUS SYRINGE: 28 days supply | Qty: 4 | Fill #1

## 2018-09-30 MED FILL — DUPIXENT 300 MG/2 ML SUBCUTANEOUS SYRINGE: 28 days supply | Qty: 4 | Fill #1 | Status: AC

## 2018-10-15 NOTE — Unmapped (Deleted)
Internal Medicine Clinic Visit    Reason for visit: Follow Up     A/P: 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    Ryan Davenport was seen today for follow-up.    Diagnoses and all orders for this visit:    CAD (s/p MI), diastolic HF.  - Cont Atorvastatin   ??  DM.   - Hgb A1c prev ~6. Today, 7.1. Pt weight also increased by ~ 10 lbs since October. We discussed increasing exercise and incorporating more healthy diet given patient's current weight gain.  Patient feels that with these lifestyle modifications we may be able to get his A1c back to goal of less than 7.  We discussed following up in 3 months to recheck A1c prior to titration of medicines.   - Continue glipizide, metformin  -  lisinopril 2.5mg , patient has had evidence of microalbuminuria in the past  ??  Gout: Patient seen in the emergency department in the last few months for gout flare involving his right ankle.  He was given prednisone, colchicine.  On exam today there is no erythema, swelling, tenderness in any of the joints.  The prior flare looks to be resolved.  - Continue colchicine  ??  HTN. BP  today at goal. Has had recent labs, stable   - Continue statin  - lisinopril 2.5 mg daily    ??HIV.  Last CD4 count 550 and undetectable HIV RNA in 02/2018. Follows in with ID clinic in [place]. Recently treated with CTX/Azithro for oral gonorrhea in 02/2018.   - Continue tivicay, descovvy    Syphilis: Patient recently with positive RPR titer checked by his infectious disease doctor.  Patient reports a allergy to penicillin though does state that he has received penicillin shots in the past for diagnosis of syphilis.  Patient states that he is currently on a course of doxycycline prescribed by his infectious disease doctor and will be following up on July 30 for reassessment.  Patient states that he is not currently in any romantic relationship and is not sexually active.  Patient states that he was tested for syphilis after a one night stand that occurred approximately 1 month ago.  We discussed safe sex practices today especially given his diagnosis of syphilis and refraining from sexual activity until he can be reassessed by his infectious disease doctor and completing treatment.  ??  H/o DVT/PE.  - Continue warfarin 7.5mg  daily  - INR today ***, followed in coag clinic  ??  Dermatitis: Followed by Vaughan Sine .  - Cont dupilumab  - Recently started on clobetasol to recalcitrant plaques, betamethasone in place of TAC, zyrtec 10mg  qPM  - Cont atarax 10mg  nightly  ??  Depression. Reports mood is good today.  - Continue celexa 40   ??  HCM.  - UTD on all vaccines  - Colonoscopy 10/2013 WNL    Follow-up in 3 months to recheck A1c.    __________________________________________________________    HPI:    Patient states that he is doing quite well today.  He did stop his toe on the left foot a few days ago.  He states that he was seen in the emergency department at [facility] and had a negative x-ray.  He is currently treating his pain with Tylenol.  Patient does state that he gets chronic numbness and tingling in his lower extremities which is chronic  and been going on for years.  We initially discussed if trying a medication for symptomatic relief would be a good idea but patient at this time feels he would like to hold off on adding any other medications to his regimen, his symptoms are not particularly distressing.     Otherwise patient states that he was recently seen in infectious disease clinic.  He states that he did have a one night stand prior to this visit and while not having any particular symptoms at that time his infectious disease doctor decided to recheck some STD labs including an RPR for syphilis.  This test on the right being positive.  He denies any other sexual encounters since that nor is he in any monogamous romantic relationship at this time.  Patient states that he was told he had a history of penicillin allergy in the past and therefore is currently completing a course of doxycycline and he will follow-up with his infectious disease doctor on July 30.     Otherwise patient denies any shortness of breath, chest pain, abdominal pain, nausea, vomiting.  Appetite is good.  We did discuss that he seems to have gained approximately 10 pounds over the last several months.  Patient does state that he is currently in the process of finding any gym and plans to begin exercising and altering his diet to avoid fast food and begin eating more healthy in an attempt to lose approximately 5 to 10 pounds.    Review of Systems   All other systems reviewed and are negative.    __________________________________________________________    Problem List:  Patient Active Problem List   Diagnosis   ??? 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 Allergy to latex   ??? Asthma, chronic (RAF-HCC)   ??? Chronic cardiopulmonary disease (CMS-HCC)   ??? Chronic diastolic heart failure (CMS-HCC)   ??? Cytomegaloviral disease (CMS-HCC)   ??? Dermatitis   ??? Dysplasia of anus   ??? Enthesopathy of knee   ??? Esophageal reflux   ??? Gout   ??? Horseshoe tear of retina   ??? Human immunodeficiency virus (HIV) disease (CMS-HCC)   ??? Essential hypertension   ??? Molluscum contagiosum   ??? Mononeuritis   ??? 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 infarction   ??? Convulsions (CMS-HCC)   ??? Hereditary and idiopathic peripheral neuropathy   ??? Inguinal hernia   ??? Early syphilis, secondary syphilis   ??? Coronary atherosclerosis (RAF-HCC)   ??? Acid reflux (RAF-HCC)   ??? Chronic eczema   ??? Depressive disorder, not elsewhere classified   ??? Allergic rhinitis   ??? Diabetes mellitus, controlled (CMS-HCC)   ??? Diastasis recti   ??? AIDS (CMS-HCC)   ??? Obesity (BMI 30.0-34.9)   ??? Disc disease, degenerative, lumbar or lumbosacral       Medications:  Reviewed in EPIC  __________________________________________________________ Physical Exam:   Vital Signs:  There were no vitals filed for this visit.    Gen: Well appearing, NAD  CV: RRR, no murmurs  Pulm: CTA bilaterally, no crackles or wheezes  Abd: Soft, NTND, normal BS. No HSM.  Ext: No edema, Left 3/4th toes on left foot wrapped/taped together, no obvious swelling or erythema noted.

## 2018-10-15 NOTE — Unmapped (Signed)
Internal Medicine Clinic Visit    Reason for visit: Follow Up     A/P: 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    Ryan Davenport was seen today for follow-up.    Diagnoses and all orders for this visit:    CAD (s/p MI), diastolic HF.  - Cont Atorvastatin     Asthma: Has not had PFTs and previously required inhalers, but has not been taking them for many years now. He does not describe or endorse any cough, wheezing, or difficulty breathing with activity or at rest that is consistent with asthma at this time.   ??  DM: Hgb A1c prev 7.1 in 06/2018. Today, 8.1. Pt weight also increased by ~20 lbs since January 2018. He knows that he has gained weight and admits to drinking more soda recently than he had previously. We discussed the importance of monitoring his sugar intake and weight in order to keep his DM controlled. He agreed to try and cut down on his intake before our next visit. We also discussed continuing to check his BGs at home so we can discuss them at his next appointment. - increase to metformin 1000mg  BID today  -  lisinopril 2.5mg , patient has had evidence of microalbuminuria in the past  - repeat microablumin  ??  HTN. BP  today at goal. Has had recent labs, stable   - Continue statin  - lisinopril 2.5 mg daily    ??HIV.  Last CD4 count 550 and undetectable HIV RNA in 02/2018. Follows in with ID clinic in [place]. Recently treated with CTX/Azithro for oral gonorrhea in 02/2018.   - Continue tivicay, descovvy  ??  H/o DVT/PE.  - Continue warfarin 7.5mg  daily  - INR today 3.8, followed in coag clinic  ??  Depression. Reports mood is good today, but that celexa has not helping him for a very long time. When further prompted, he admits that he does not think Celexa ever made a big difference for him. He states that he would be interested in changing his medications to see if they make a difference. Additionally, he used to have a counselor/therapist through ID that he saw, but he has not been connected w/ one in seveal years. He would like to readdress this at our next visit.   - d/c celexa 40   - start zoloft 50mg  daily w/ plans to titrate up over time  - will repeat PHQ-9 at next visit  - will discuss [facility] at next visit    Dysphagia: Pt has a several year history of emesis w/ eating solid foods. He describes a sensation of dysphagia and food getting stuck consistently w/ solids. He notes that liquids or thickened liquids are fine for him, but that things like rice, meat, and bread often feel like they get stuck in his throat. When this happens (~3 times per week), he either vomits or he requires a lot of water to help rinse it down. He has not followed with GI previously for this issues, but is amenable to being connected with them.   - GI referral    HFpEF: Last echo in 2015 demonstrated normal EF and no significant filling or valvular defects. However, he describes a worsening sensation of orthopnea requiring that he sleep with multiple pillows under him at night. Additionally, he describes becoming SOB w/ activity. He states that when he  walks around in the store, he just feels tired and SOB at the end of his trip. Lastly, if he is on his feet consistently, he will have significant LE edema at the end of the day that will improve w/ elevation of his feet overnight. Pt previously was on lasix and lisinopril several years ago, but has not been on lasix since about 2014.   - cont lisinopril  - repeat echo for monitoring  - may consider adding additional medications for improvement depending on the results of his echo  ??  HCM.  - UTD on all vaccines  - Colonoscopy 10/2013 WNL    Follow-up in 6 weeks to discuss medication changes and BGs    Staffed w/ Ryan Davenport    __________________________________________________________    HPI:    Was last seen on 7/23 by Ryan Davenport. Was generally well at the time. Today he presents for f/u and states that he continues to feel well.     A1c increased to 8.1 today. Pt states that he is taking his BGs about once a day at home and they range from 180-250 fasting. He admits that he has been gaining weight lately, and has increased his soda intake which is contributing to his change in A1c.     Pt complains of significant dysphagia sensation. He notes that it's worse with solid foods and less w/ liquids or thicker liquids. Sometimes is significant enough to cause emesis. Happens roughly 2-3 times per week.     Additionally he complains of a worsening bulge in his stomach. He has been seen previously by some provider, and was told it was not a hernia, but was rather a pouch. It does not bother him symptomatically, he just dislikes the way it appears.     Endorses orthopnea today. Says he has to lay with a few pillows underneath him to sleep at night. This has been present for several years now. He says taht when he's really tired, he'll fall asleep regardless, but often times, he gets short of breath. He does note that he snores when sleeping and his mother has h/o OSA and wears a CPAP but he has never needed one. He gets SOB with walking now but denies any anginal-type symptoms.     Otherwise denies any fevers, chills, odynophagia, CP, SOB, cough, nausea, constipation, diarrhea, weight changes, hematochezia, dysuria, or hematuria.     Review of Systems   All other systems reviewed and are negative.    __________________________________________________________    Problem List:  Patient Active Problem List   Diagnosis   ??? 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 Allergy to latex   ??? Asthma, chronic (RAF-HCC)   ??? Chronic cardiopulmonary disease (CMS-HCC)   ??? Chronic diastolic heart failure (CMS-HCC)   ??? Cytomegaloviral disease (CMS-HCC)   ??? Dermatitis   ??? Dysplasia of anus   ??? Enthesopathy of knee   ??? Esophageal reflux   ??? Gout   ??? Horseshoe tear of retina   ??? Human immunodeficiency virus (HIV) disease (CMS-HCC)   ??? Essential hypertension   ??? Molluscum contagiosum   ??? Mononeuritis   ??? 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 infarction   ??? Convulsions (CMS-HCC)   ??? Hereditary and idiopathic peripheral neuropathy   ??? Inguinal hernia   ??? Early syphilis, secondary syphilis   ??? Coronary atherosclerosis (RAF-HCC)   ??? Acid reflux (RAF-HCC)   ??? Chronic eczema   ??? Depressive disorder, not elsewhere classified   ??? Allergic  rhinitis   ??? Diabetes mellitus, controlled (CMS-HCC)   ??? Diastasis recti   ??? AIDS (CMS-HCC)   ??? Obesity (BMI 30.0-34.9)   ??? Disc disease, degenerative, lumbar or lumbosacral       Medications:  Reviewed in EPIC  __________________________________________________________    Physical Exam:   Vital Signs:  Vitals:    10/16/18 1433   BP: 149/79   Pulse: 90   Resp: 18   Temp: 37.1 ??C   SpO2: 98%   Weight: (!) 115.1 kg (253 lb 11.2 oz)   Height: 182.9 cm (6')       Gen: Well appearing, NAD  Neck: No lymphadenopathy  CV: RRR, no murmurs  Pulm: CTA bilaterally, no crackles or wheezes  Abd: Soft, NTND, normal BS. No HSM.  Ext: No edema

## 2018-10-16 ENCOUNTER — Encounter: Admit: 2018-10-16 | Discharge: 2018-10-17 | Payer: MEDICARE

## 2018-10-16 DIAGNOSIS — I824Y2 Acute embolism and thrombosis of unspecified deep veins of left proximal lower extremity: Secondary | ICD-10-CM

## 2018-10-16 DIAGNOSIS — R1310 Dysphagia, unspecified: Secondary | ICD-10-CM

## 2018-10-16 DIAGNOSIS — B20 Human immunodeficiency virus [HIV] disease: Secondary | ICD-10-CM

## 2018-10-16 DIAGNOSIS — F329 Major depressive disorder, single episode, unspecified: Secondary | ICD-10-CM

## 2018-10-16 DIAGNOSIS — E119 Type 2 diabetes mellitus without complications: Secondary | ICD-10-CM

## 2018-10-16 DIAGNOSIS — I2699 Other pulmonary embolism without acute cor pulmonale: Principal | ICD-10-CM

## 2018-10-16 DIAGNOSIS — E1140 Type 2 diabetes mellitus with diabetic neuropathy, unspecified: Principal | ICD-10-CM

## 2018-10-16 DIAGNOSIS — E669 Obesity, unspecified: Secondary | ICD-10-CM

## 2018-10-16 DIAGNOSIS — I5032 Chronic diastolic (congestive) heart failure: Secondary | ICD-10-CM

## 2018-10-16 DIAGNOSIS — I10 Essential (primary) hypertension: Secondary | ICD-10-CM

## 2018-10-16 LAB — ALBUMIN/CREATININE RATIO: Albumin/Creatinine:MRto:Pt:Urine:Qn:: 709 — ABNORMAL HIGH

## 2018-10-16 LAB — ALBUMIN / CREATININE URINE RATIO
ALBUMIN QUANT URINE: 177.1 mg/dL
ALBUMIN/CREATININE RATIO: 709 ug/mg — ABNORMAL HIGH (ref 0.0–30.0)

## 2018-10-16 MED ORDER — COLCHICINE 0.6 MG TABLET
ORAL_TABLET | Freq: Every day | ORAL | 2 refills | 0 days | Status: CP
Start: 2018-10-16 — End: 2019-01-01

## 2018-10-16 MED ORDER — SERTRALINE 50 MG TABLET
ORAL_TABLET | Freq: Every day | ORAL | 3 refills | 0 days | Status: CP
Start: 2018-10-16 — End: 2019-01-29

## 2018-10-16 MED ORDER — METFORMIN 1,000 MG TABLET
ORAL_TABLET | Freq: Two times a day (BID) | ORAL | 3 refills | 0 days | Status: CP
Start: 2018-10-16 — End: 2019-06-15

## 2018-10-16 NOTE — Unmapped (Signed)
[facility] Refill Coordination Note    Specialty Medication(s) to be Shipped:   Inflammatory Disorders: Dupixent    Other medication(s) to be shipped: n/a     Shanna Cisco, DOB: [birth year]  Phone: [phone] (home)       All above HIPAA information was verified with patient.     Completed refill call assessment today to schedule patient's medication shipment from the [facility] [phone]).       Specialty medication(s) and dose(s) confirmed: Regimen is correct and unchanged.   Changes to medications: Garek reports no changes reported at this time.  Changes to insurance: No  Questions for the pharmacist: No    The patient will receive a drug information handout for each medication shipped and additional FDA Medication Guides as required.      DISEASE/MEDICATION-SPECIFIC INFORMATION        For Inflammatory disorders patients on injectable medications: Patient currently has 1 doses left.  Next injection is scheduled for today.    ADHERENCE        No missed doses reported at this time    [facility]     Shipping address confirmed in Epic.     Delivery Scheduled: Yes, Expected medication delivery date: 10/31 via UPS or courier.     Medication will be delivered via UPS to the home address in Epic WAM.    [name]   [facility] Specialty Technician

## 2018-10-16 NOTE — Unmapped (Signed)
I saw and evaluated the patient, participating in the key portions of the service.  I reviewed the resident’s note.  I agree with the resident’s findings and plan. Dujuan Stankowski E Kimel-Scott, MD

## 2018-10-16 NOTE — Unmapped (Signed)
Your INR:  3.8    This is too high    Plan for your warfarin:  Decrease to 5 mg (1 tablet) on Tuesday, Thursday, Sunday, and 7.5 mg (1.5 tablets) all other days.  Return in 2 weeks.     Please be consistent with your green vegetables and take your warfarin daily as recommended.  Always contact [facility] before starting any new medications, including over-the-counter meds and supplements.

## 2018-10-16 NOTE — Unmapped (Signed)
[facility]  [facility] INTERNAL MEDICINE [place]  [address]  [place] [address]  [phone]    Assessment/Plan:     1. Other pulmonary embolism without acute cor pulmonale, unspecified chronicity (CMS-HCC)    2.  Gout    ~Anticoagulation is at steady state:  unclear  ~Current warfarin dose:  7.5 mg daily  INR Goal: 2.0-3.0     Lab Results   Component Value Date    INR, POC 3.8 10/16/2018    INR, POC 3.2 09/11/2018    INR, POC 1.7 08/12/2018    INR, POC 2.60 05/28/2017    INR, POC 2.7 03/30/2015    INR, POC 2.1 02/23/2015     ~INR today: supratherapeutic without clear explanation.  However, his last INR was at upper limit of acceptable range, so will decrease dose about 10%.   ~plan for Warfarin Dose:  decrease dose to 5 mg TRSa, 7.5 mg others  ~Repeat INR Date:  10/30/18  ~The patient set a personal goal for anticoagulation management today:Keep green leafy vegetables consistent    Gout  ~No recent gout flares. Refilled colchicine.    Subject/Objective:     Patient: Ryan Davenport    Medical Record Number:  [id number]    PRIMARY CARE PHYSICIAN: Mart Piggs, MD    DD:[id number] pulmonary embolism   Secondary Indication:  CVT    Patient Active Problem List   Diagnosis   ??? 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 Allergy to latex   ??? Asthma, chronic (RAF-HCC)   ??? Chronic cardiopulmonary disease (CMS-HCC)   ??? Chronic diastolic heart failure (CMS-HCC)   ??? Cytomegaloviral disease (CMS-HCC)   ??? Dermatitis   ??? Dysplasia of anus   ??? Enthesopathy of knee   ??? Esophageal reflux   ??? Gout   ??? Horseshoe tear of retina   ??? Human immunodeficiency virus (HIV) disease (CMS-HCC)   ??? Essential hypertension   ??? Molluscum contagiosum   ??? Mononeuritis   ??? 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 infarction   ??? Convulsions (CMS-HCC)   ??? Hereditary and idiopathic peripheral neuropathy   ??? Inguinal hernia   ??? Early syphilis, secondary syphilis   ??? Coronary atherosclerosis (RAF-HCC)   ??? Acid reflux (RAF-HCC)   ??? Chronic eczema   ??? Depressive disorder, not elsewhere classified   ??? Allergic rhinitis   ??? Diabetes mellitus, controlled (CMS-HCC)   ??? Diastasis recti   ??? AIDS (CMS-HCC)   ??? Obesity (BMI 30.0-34.9)   ??? Disc disease, degenerative, lumbar or lumbosacral       Current Outpatient Medications on File Prior to Visit   Medication Sig Dispense Refill   ??? alcohol swabs PadM Use to check blood sugars once daily. ICD-10 E11.9 (Type 2 DM). Dispense insurance approved 100 each 3   ??? atorvastatin (LIPITOR) 40 MG tablet Take 1 tablet (40 mg total) by mouth daily. 90 tablet 3   ??? betamethasone dipropionate (DIPROLENE) 0.05 % ointment Apply topically Two (2) times a day. 90 g 2   ??? betamethasone dipropionate (DIPROLENE) 0.05 % ointment Apply topically Two (2) times a day. Apply to problem areas on back 45 g 3   ??? BIKTARVY 50-200-25 mg tablet Take 1 tablet by mouth daily at 0600.  11   ??? blood sugar diagnostic (ACCU-CHEK AVIVA PLUS TEST STRP) Strp by Other route once daily. ICD-10 E11.9 100 strip 3   ??? blood-glucose meter kit Use to check blood sugars once daily. ICD-10 E11.9 (  Type 2 DM). Dispense insurance approved 1 each 0   ??? cetirizine (ZYRTEC) 10 MG tablet Take 1 tablet (10 mg total) by mouth daily. Take in the morning for itching 90 tablet 3   ??? citalopram (CELEXA) 40 MG tablet Take 1 tablet (40 mg total) by mouth daily. 90 tablet 3   ??? clobetasol (TEMOVATE) 0.05 % ointment Apply topically Two (2) times a day. For stubborn lesions on the feet 60 g 3   ??? dupilumab (DUPIXENT) 300 mg/2 mL Syrg injection INJECT THE CONTENTS OF 2 SYRINGES (600MG ) UNDER THE SKIN ON DAY 0 THEN INJECT THE CONTENTS OF 1 SYRINGE (300MG ) EVERY 14 DAYS 4 mL 6   ??? empty container Misc USE AS DIRECTED 1 each 2   ??? glipiZIDE (GLUCOTROL XL) 2.5 MG 24 hr tablet Take 1 tablet (2.5 mg total) by mouth daily. 90 tablet 3   ??? hydrOXYzine (ATARAX) 10 MG tablet Take 1 tablet (10 mg total) by mouth nightly. 30 tablet 2   ??? lisinopril (PRINIVIL,ZESTRIL) 2.5 MG tablet TAKE 1 TABLET BY MOUTH EVERY DAY 90 tablet 1   ??? metFORMIN (GLUCOPHAGE-XR) 500 MG 24 hr tablet Take 2 tablets (1,000 mg total) by mouth every morning before breakfast. 180 tablet 3   ??? nystatin (MYCOSTATIN) 100,000 unit/mL suspension Take 5 mL (500,000 Units total) by mouth Four (4) times a day. 60 mL 1   ??? triamcinolone (KENALOG) 0.1 % cream APPLY TO THE AFFECTED AREA TWICE DAILY 454 g 0   ??? triamcinolone (KENALOG) 0.1 % ointment Apply twice a day to affected areas 454 g 6   ??? warfarin (COUMADIN) 5 MG tablet 7.5mg  (1.5 tabs) daily or as directed 60 tablet prn     No current facility-administered medications on file prior to visit.        Current Outpatient Medications   Medication Sig Dispense Refill   ??? alcohol swabs PadM Use to check blood sugars once daily. ICD-10 E11.9 (Type 2 DM). Dispense insurance approved 100 each 3   ??? atorvastatin (LIPITOR) 40 MG tablet Take 1 tablet (40 mg total) by mouth daily. 90 tablet 3   ??? betamethasone dipropionate (DIPROLENE) 0.05 % ointment Apply topically Two (2) times a day. 90 g 2   ??? betamethasone dipropionate (DIPROLENE) 0.05 % ointment Apply topically Two (2) times a day. Apply to problem areas on back 45 g 3   ??? BIKTARVY 50-200-25 mg tablet Take 1 tablet by mouth daily at 0600.  11   ??? blood sugar diagnostic (ACCU-CHEK AVIVA PLUS TEST STRP) Strp by Other route once daily. ICD-10 E11.9 100 strip 3   ??? blood-glucose meter kit Use to check blood sugars once daily. ICD-10 E11.9 (Type 2 DM). Dispense insurance approved 1 each 0   ??? cetirizine (ZYRTEC) 10 MG tablet Take 1 tablet (10 mg total) by mouth daily. Take in the morning for itching 90 tablet 3   ??? citalopram (CELEXA) 40 MG tablet Take 1 tablet (40 mg total) by mouth daily. 90 tablet 3   ??? clobetasol (TEMOVATE) 0.05 % ointment Apply topically Two (2) times a day. For stubborn lesions on the feet 60 g 3   ??? colchicine (COLCRYS) 0.6 mg tablet Take 1 tablet (0.6 mg total) by mouth daily. 90 tablet 2   ??? dupilumab (DUPIXENT) 300 mg/2 mL Syrg injection INJECT THE CONTENTS OF 2 SYRINGES (600MG ) UNDER THE SKIN ON DAY 0 THEN INJECT THE CONTENTS OF 1 SYRINGE (300MG ) EVERY 14 DAYS 4 mL 6   ???  empty container Misc USE AS DIRECTED 1 each 2   ??? glipiZIDE (GLUCOTROL XL) 2.5 MG 24 hr tablet Take 1 tablet (2.5 mg total) by mouth daily. 90 tablet 3   ??? hydrOXYzine (ATARAX) 10 MG tablet Take 1 tablet (10 mg total) by mouth nightly. 30 tablet 2   ??? lisinopril (PRINIVIL,ZESTRIL) 2.5 MG tablet TAKE 1 TABLET BY MOUTH EVERY DAY 90 tablet 1   ??? metFORMIN (GLUCOPHAGE-XR) 500 MG 24 hr tablet Take 2 tablets (1,000 mg total) by mouth every morning before breakfast. 180 tablet 3   ??? nystatin (MYCOSTATIN) 100,000 unit/mL suspension Take 5 mL (500,000 Units total) by mouth Four (4) times a day. 60 mL 1   ??? triamcinolone (KENALOG) 0.1 % cream APPLY TO THE AFFECTED AREA TWICE DAILY 454 g 0   ??? triamcinolone (KENALOG) 0.1 % ointment Apply twice a day to affected areas 454 g 6   ??? warfarin (COUMADIN) 5 MG tablet 7.5mg  (1.5 tabs) daily or as directed 60 tablet prn     No current facility-administered medications for this visit.        Medication Review:  Medications reviewed including OTC and herbals:yes    Review of Systems:  Signs/Symptoms of Potential Embolic Events:   Headache, dizziness, weakness:  no  Unilateral Edema:   no  Chest pain, shortness of breath:   no    Signs/Symptoms of Bleeding:  Blood in urine:   no  Blood in stool or dark, tarry stools:   no  Nose bleeds or gum bleeds:  no  Unexplained bruises:   no    Changes in Medications:   yes    Consistency in vitamin K intake:   no    Missed doses of warfarin:   unclear    Additional History: Ran out of colchicine 1 month ago. No anticipated change in INR due to this.     Physical Examination:   General:  well appearing in no acute distress

## 2018-10-16 NOTE — Unmapped (Addendum)
1. Please stop taking the metformin you have at home and start taking your new prescription which you will take twice a day.     2. Please work on reducing your sugar intake. Water is the best thing for staying hydrated. Please keep checking your sugars at home and we will discuss them at your next appointment.     3. Please stop taking your Celexa and start taking Zoloft.     4. Someone will call you to schedule the study of your heart.     5. Someone will call you to schedule an appointment with the stomach specialists.

## 2018-10-20 MED ORDER — HYDROXYZINE HCL 10 MG TABLET
ORAL_TABLET | Freq: Every evening | ORAL | 0 refills | 0.00000 days | Status: CP | PRN
Start: 2018-10-20 — End: 2018-12-09

## 2018-10-20 NOTE — Unmapped (Signed)
Called patient to discuss Atarax given co-treatment with SSRI. States he only takes this PRN for severe pruritus and hi PCP instructed him about the QTc prolongation that can occur with SSRIs. I called in refills with these same instructions (qHS PRN)

## 2018-10-22 MED FILL — DUPIXENT 300 MG/2 ML SUBCUTANEOUS SYRINGE: 28 days supply | Qty: 4 | Fill #2

## 2018-10-22 MED FILL — DUPIXENT 300 MG/2 ML SUBCUTANEOUS SYRINGE: 28 days supply | Qty: 4 | Fill #2 | Status: AC

## 2018-10-31 ENCOUNTER — Encounter: Admit: 2018-10-31 | Discharge: 2018-11-01 | Payer: MEDICARE

## 2018-10-31 DIAGNOSIS — I5032 Chronic diastolic (congestive) heart failure: Principal | ICD-10-CM

## 2018-11-04 ENCOUNTER — Encounter: Admit: 2018-11-04 | Discharge: 2018-11-05 | Payer: MEDICARE

## 2018-11-04 DIAGNOSIS — I2699 Other pulmonary embolism without acute cor pulmonale: Principal | ICD-10-CM

## 2018-11-04 MED ORDER — ONDANSETRON 4 MG DISINTEGRATING TABLET
ORAL_TABLET | Freq: Three times a day (TID) | ORAL | 1 refills | 0 days | Status: CP | PRN
Start: 2018-11-04 — End: 2018-12-09

## 2018-11-04 MED ORDER — LISINOPRIL 2.5 MG TABLET
ORAL_TABLET | Freq: Every day | ORAL | 3 refills | 0.00000 days | Status: CP
Start: 2018-11-04 — End: 2019-08-13

## 2018-11-04 NOTE — Unmapped (Signed)
[facility]  [facility] INTERNAL MEDICINE [place]  [address]  [place] [address]  [phone]    Assessment/Plan:     1. Pulmonary embolism, unspecified chronicity, unspecified pulmonary embolism type, unspecified whether acute cor pulmonale present (CMS-HCC)      ~Anticoagulation is at steady state:  yes with INR 2.5 today  ~Current warfarin dose:  5 mg TRSa, 7.5 mg others   INR Goal: 2.0-3.0     Lab Results   Component Value Date    INR, POC 2.5 11/04/2018    INR, POC 3.8 10/16/2018    INR, POC 3.2 09/11/2018    INR, POC 1.7 08/12/2018    INR, POC 2.60 05/28/2017    INR, POC 2.7 03/30/2015    INR, POC 2.1 02/23/2015     ~INR today: therapeutic  ~plan for Warfarin Dose:  continue current dose  ~Repeat INR Date:  12/02/18  ~The patient set a personal goal for anticoagulation management today:Keep green leafy vegetables consistent    HTN  ~Refilled lisinopril    Nausea  ~Refilled ondansetron, encouraged to use sparingly    Subject/Objective:     Patient: Ryan Davenport    Medical Record Number:  [id number]    PRIMARY CARE PHYSICIAN: Mart Piggs, MD    UU:[id number] pulmonary embolism   Secondary Indication:  DVT    Patient Active Problem List   Diagnosis   ??? 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 Allergy to latex   ??? Chronic cardiopulmonary disease (CMS-HCC)   ??? Chronic diastolic heart failure (CMS-HCC)   ??? Cytomegaloviral disease (CMS-HCC)   ??? Dermatitis   ??? Dysplasia of anus   ??? Enthesopathy of knee   ??? Esophageal reflux   ??? Gout   ??? Horseshoe tear of retina   ??? Human immunodeficiency virus (HIV) disease (CMS-HCC)   ??? Essential hypertension   ??? Molluscum contagiosum   ??? Mononeuritis   ??? 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 infarction   ??? Convulsions (CMS-HCC)   ??? Hereditary and idiopathic peripheral neuropathy   ??? Inguinal hernia   ??? Early syphilis, secondary syphilis   ??? Coronary atherosclerosis (RAF-HCC)   ??? Acid reflux (RAF-HCC)   ??? Chronic eczema   ??? Depressive disorder, not elsewhere classified   ??? Allergic rhinitis   ??? Diabetes mellitus, controlled (CMS-HCC)   ??? Diastasis recti   ??? AIDS (CMS-HCC)   ??? Obesity (BMI 30.0-34.9)   ??? Disc disease, degenerative, lumbar or lumbosacral       Current Outpatient Medications on File Prior to Visit   Medication Sig Dispense Refill   ??? alcohol swabs PadM Use to check blood sugars once daily. ICD-10 E11.9 (Type 2 DM). Dispense insurance approved 100 each 3   ??? atorvastatin (LIPITOR) 40 MG tablet Take 1 tablet (40 mg total) by mouth daily. 90 tablet 3   ??? betamethasone dipropionate (DIPROLENE) 0.05 % ointment Apply topically Two (2) times a day. 90 g 2   ??? betamethasone dipropionate (DIPROLENE) 0.05 % ointment Apply topically Two (2) times a day. Apply to problem areas on back 45 g 3   ??? BIKTARVY 50-200-25 mg tablet Take 1 tablet by mouth daily at 0600.  11   ??? blood sugar diagnostic (ACCU-CHEK AVIVA PLUS TEST STRP) Strp by Other route once daily. ICD-10 E11.9 100 strip 3   ??? blood-glucose meter kit Use to check blood sugars once daily. ICD-10 E11.9 (Type 2 DM). Dispense insurance approved 1 each 0   ???  cetirizine (ZYRTEC) 10 MG tablet Take 1 tablet (10 mg total) by mouth daily. Take in the morning for itching 90 tablet 3   ??? clobetasol (TEMOVATE) 0.05 % ointment Apply topically Two (2) times a day. For stubborn lesions on the feet 60 g 3   ??? colchicine (COLCRYS) 0.6 mg tablet Take 1 tablet (0.6 mg total) by mouth daily. 90 tablet 2   ??? dupilumab (DUPIXENT) 300 mg/2 mL Syrg injection INJECT THE CONTENTS OF 2 SYRINGES (600MG ) UNDER THE SKIN ON DAY 0 THEN INJECT THE CONTENTS OF 1 SYRINGE (300MG ) EVERY 14 DAYS 4 mL 6   ??? empty container Misc USE AS DIRECTED 1 each 2   ??? glipiZIDE (GLUCOTROL XL) 2.5 MG 24 hr tablet Take 1 tablet (2.5 mg total) by mouth daily. 90 tablet 3   ??? hydrOXYzine (ATARAX) 10 MG tablet Take 1 tablet (10 mg total) by mouth nightly as needed for itching. 30 tablet 0   ??? metFORMIN (GLUCOPHAGE) 1000 MG tablet Take 1 tablet (1,000 mg total) by mouth 2 (two) times a day with meals. 180 tablet 3   ??? nystatin (MYCOSTATIN) 100,000 unit/mL suspension Take 5 mL (500,000 Units total) by mouth Four (4) times a day. 60 mL 1   ??? sertraline (ZOLOFT) 50 MG tablet Take 1 tablet (50 mg total) by mouth daily. 90 tablet 3   ??? triamcinolone (KENALOG) 0.1 % cream APPLY TO THE AFFECTED AREA TWICE DAILY 454 g 0   ??? triamcinolone (KENALOG) 0.1 % ointment Apply twice a day to affected areas 454 g 6   ??? warfarin (COUMADIN) 5 MG tablet 7.5mg  (1.5 tabs) daily or as directed 60 tablet prn   ??? [DISCONTINUED] citalopram (CELEXA) 40 MG tablet Take 1 tablet (40 mg total) by mouth daily. 90 tablet 3     No current facility-administered medications on file prior to visit.        Current Outpatient Medications   Medication Sig Dispense Refill   ??? alcohol swabs PadM Use to check blood sugars once daily. ICD-10 E11.9 (Type 2 DM). Dispense insurance approved 100 each 3   ??? atorvastatin (LIPITOR) 40 MG tablet Take 1 tablet (40 mg total) by mouth daily. 90 tablet 3   ??? betamethasone dipropionate (DIPROLENE) 0.05 % ointment Apply topically Two (2) times a day. 90 g 2   ??? betamethasone dipropionate (DIPROLENE) 0.05 % ointment Apply topically Two (2) times a day. Apply to problem areas on back 45 g 3   ??? BIKTARVY 50-200-25 mg tablet Take 1 tablet by mouth daily at 0600.  11   ??? blood sugar diagnostic (ACCU-CHEK AVIVA PLUS TEST STRP) Strp by Other route once daily. ICD-10 E11.9 100 strip 3   ??? blood-glucose meter kit Use to check blood sugars once daily. ICD-10 E11.9 (Type 2 DM). Dispense insurance approved 1 each 0   ??? cetirizine (ZYRTEC) 10 MG tablet Take 1 tablet (10 mg total) by mouth daily. Take in the morning for itching 90 tablet 3   ??? clobetasol (TEMOVATE) 0.05 % ointment Apply topically Two (2) times a day. For stubborn lesions on the feet 60 g 3   ??? colchicine (COLCRYS) 0.6 mg tablet Take 1 tablet (0.6 mg total) by mouth daily. 90 tablet 2   ??? dupilumab (DUPIXENT) 300 mg/2 mL Syrg injection INJECT THE CONTENTS OF 2 SYRINGES (600MG ) UNDER THE SKIN ON DAY 0 THEN INJECT THE CONTENTS OF 1 SYRINGE (300MG ) EVERY 14 DAYS 4 mL 6   ??? empty container  Misc USE AS DIRECTED 1 each 2   ??? glipiZIDE (GLUCOTROL XL) 2.5 MG 24 hr tablet Take 1 tablet (2.5 mg total) by mouth daily. 90 tablet 3   ??? hydrOXYzine (ATARAX) 10 MG tablet Take 1 tablet (10 mg total) by mouth nightly as needed for itching. 30 tablet 0   ??? lisinopril (PRINIVIL,ZESTRIL) 2.5 MG tablet Take 1 tablet (2.5 mg total) by mouth daily. 90 tablet 3   ??? metFORMIN (GLUCOPHAGE) 1000 MG tablet Take 1 tablet (1,000 mg total) by mouth 2 (two) times a day with meals. 180 tablet 3   ??? nystatin (MYCOSTATIN) 100,000 unit/mL suspension Take 5 mL (500,000 Units total) by mouth Four (4) times a day. 60 mL 1   ??? ondansetron (ZOFRAN-ODT) 4 MG disintegrating tablet Take 1 tablet (4 mg total) by mouth every eight (8) hours as needed for nausea. for up to 7 days 60 tablet 1   ??? sertraline (ZOLOFT) 50 MG tablet Take 1 tablet (50 mg total) by mouth daily. 90 tablet 3   ??? triamcinolone (KENALOG) 0.1 % cream APPLY TO THE AFFECTED AREA TWICE DAILY 454 g 0   ??? triamcinolone (KENALOG) 0.1 % ointment Apply twice a day to affected areas 454 g 6   ??? warfarin (COUMADIN) 5 MG tablet 7.5mg  (1.5 tabs) daily or as directed 60 tablet prn     No current facility-administered medications for this visit.        Medication Review:  Medications reviewed including OTC and herbals:yes    Review of Systems:  Signs/Symptoms of Potential Embolic Events:   Headache, dizziness, weakness:  no  Unilateral Edema:   no  Chest pain, shortness of breath:   no    Signs/Symptoms of Bleeding:  Blood in urine:   no  Blood in stool or dark, tarry stools:   no  Nose bleeds or gum bleeds:  no  Unexplained bruises:   no    Changes in Medications:   no    Consistency in vitamin K intake:   yes    Missed doses of warfarin:   no    Additional History: None    Physical Examination:   General:  well appearing in no acute distress

## 2018-11-04 NOTE — Unmapped (Signed)
Your INR:  2.5    This is at goal    Plan for your warfarin:  Continue current dose    Please be consistent with your green vegetables and take your warfarin daily as recommended.  Always contact [facility] before starting any new medications, including over-the-counter meds and supplements.

## 2018-11-13 NOTE — Unmapped (Signed)
[facility] Refill Coordination Note    Specialty Medication(s) to be Shipped:   Inflammatory Disorders: Dupixent    Other medication(s) to be shipped: n/a     Ryan Davenport, DOB: [birth year]  Phone: [phone] (home)       All above HIPAA information was verified with patient.     Completed refill call assessment today to schedule patient's medication shipment from the [facility] [id number]).       Specialty medication(s) and dose(s) confirmed: Regimen is correct and unchanged.   Changes to medications: Ryan Davenport reports no changes reported at this time.  Changes to insurance: No  Questions for the pharmacist: No    The patient will receive a drug information handout for each medication shipped and additional FDA Medication Guides as required.      DISEASE/MEDICATION-SPECIFIC INFORMATION        For Inflammatory disorders patients on injectable medications: Patient currently has 1 doses left.  Next injection is scheduled for today (new box on 12/5).    ADHERENCE     Medication Adherence    Patient reported X missed doses in the last month:  0  Specialty Medication:  dupixent  Patient is on additional specialty medications:  No  Patient is on more than two specialty medications:  No  Any gaps in refill history greater than 2 weeks in the last 3 months:  no  Demonstrates understanding of importance of adherence:  yes  Informant:  patient  Reliability of informant:  reliable              Confirmed plan for next specialty medication refill:  delivery by pharmacy  Refills needed for supportive medications:  not needed          Refill Coordination    Has the Patients' Contact Information Changed:  No  Is the Shipping Address Different:  No           SHIPPING     Shipping address confirmed in Epic.     Delivery Scheduled: Yes, Expected medication delivery date: 11/26 via UPS or courier.     Medication will be delivered via UPS to the home address in Epic WAM.    Ryan Davenport   [facility] Specialty Technician

## 2018-11-17 MED FILL — DUPIXENT 300 MG/2 ML SUBCUTANEOUS SYRINGE: 28 days supply | Qty: 4 | Fill #3

## 2018-11-17 MED FILL — DUPIXENT 300 MG/2 ML SUBCUTANEOUS SYRINGE: 28 days supply | Qty: 4 | Fill #3 | Status: AC

## 2018-11-27 MED ORDER — TRIAMCINOLONE ACETONIDE 0.1 % TOPICAL CREAM
0 refills | 0 days | Status: CP
Start: 2018-11-27 — End: 2019-03-25

## 2018-11-29 ENCOUNTER — Encounter (HOSPITAL_COMMUNITY): Payer: Self-pay | Admitting: Emergency Medicine

## 2018-11-29 ENCOUNTER — Other Ambulatory Visit: Payer: Self-pay

## 2018-11-29 ENCOUNTER — Emergency Department (HOSPITAL_COMMUNITY)
Admission: EM | Admit: 2018-11-29 | Discharge: 2018-11-29 | Disposition: A | Discharge status: Home / Self Care | Payer: Medicare Other | Attending: Emergency Medicine | Admitting: Emergency Medicine

## 2018-11-29 DIAGNOSIS — Z7901 Long term (current) use of anticoagulants: Secondary | ICD-10-CM | POA: Insufficient documentation

## 2018-11-29 DIAGNOSIS — I5032 Chronic diastolic (congestive) heart failure: Secondary | ICD-10-CM | POA: Diagnosis not present

## 2018-11-29 DIAGNOSIS — R030 Elevated blood-pressure reading, without diagnosis of hypertension: Secondary | ICD-10-CM

## 2018-11-29 DIAGNOSIS — I252 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 infarction: Secondary | ICD-10-CM | POA: Diagnosis not present

## 2018-11-29 DIAGNOSIS — H6592 Unspecified nonsuppurative otitis media, left ear: Secondary | ICD-10-CM | POA: Diagnosis not present

## 2018-11-29 DIAGNOSIS — I2510 Atherosclerotic heart disease of native coronary artery without angina pectoris: Secondary | ICD-10-CM | POA: Insufficient documentation

## 2018-11-29 DIAGNOSIS — Z7984 Long term (current) use of oral hypoglycemic drugs: Secondary | ICD-10-CM | POA: Insufficient documentation

## 2018-11-29 DIAGNOSIS — E119 Type 2 diabetes mellitus without complications: Secondary | ICD-10-CM | POA: Diagnosis not present

## 2018-11-29 DIAGNOSIS — J449 Chronic obstructive pulmonary disease, unspecified: Secondary | ICD-10-CM | POA: Insufficient documentation

## 2018-11-29 DIAGNOSIS — Z91040 Latex allergy status: Secondary | ICD-10-CM | POA: Insufficient documentation

## 2018-11-29 DIAGNOSIS — Z79899 Other long term (current) drug therapy: Secondary | ICD-10-CM | POA: Diagnosis not present

## 2018-11-29 DIAGNOSIS — H9202 Otalgia, left ear: Secondary | ICD-10-CM | POA: Diagnosis present

## 2018-11-29 DIAGNOSIS — I110 Hypertensive heart disease with heart failure: Secondary | ICD-10-CM | POA: Diagnosis not present

## 2018-11-29 MED ORDER — FLUTICASONE PROPIONATE 50 MCG/ACT NA SUSP: 1.0000 | Freq: Every day | NASAL | 2 refills | Status: DC

## 2018-11-29 NOTE — ED Triage Notes (Signed)
Pt states he has had ear pain for the lats few weeks. Pt states his BP is elevated when checking it at home. He is compliant with his BP meds but worried about his elevated BP. Pt also complaining of nausea.

## 2018-11-29 NOTE — Discharge Instructions (Signed)
Take Flonase to help with congestion Please follow up with your doctor

## 2018-11-29 NOTE — ED Provider Notes (Signed)
[facility] EMERGENCY DEPARTMENT Provider Note   CSN: [id number] Arrival date & time: 11/29/18  1331     History   Chief Complaint Chief Complaint  Patient presents with  . Otalgia  . Hypertension    HPI Craig [name] is a 57 y.o. male who presents with high blood pressure and ear pain. PMH significant for CAD, COPD, HIV/AIDS (viral load undectable and CD4 count 780 in July), hx of DVT/PE, CHF.  Patient states that he is been checking his blood pressure for the past week because he got a blood pressure cuff.  He has not been checking it for any specific symptom or reason.  He is on lisinopril.  He has noted that his blood pressures been running in the 150s to 160s.  He denies headache, chest pain, shortness of breath, belly pain currently.  He has left ear pain for the past week or so.  He has a fullness and congestion in his sinuses.  No fever, runny nose, sore throat, right ear pain or drainage.  He was previously taking Flonase but has run out of this medicine.  He has a follow-up with his doctor coming up on December 16.  HPI  Past Medical History:  Diagnosis Date  . AIDS (HCC) 05/24/2015  . Allergic rhinitis 01/25/2016  . Atopic dermatitis 12/30/2017  . 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 Deep vein thrombosis (HCC) 05/09/2011   Overview:  Jan 2012   . Diabetes mellitus, controlled (HCC) 01/25/2016  . Dysplasia of anus   . Esophageal reflux   . 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 Hernia, inguinal 04/09/2013  . HIV (human immunodeficiency virus infection) (HCC)   . Horseshoe tear of retina without detachment 02-13-12  . 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  . Molluscum contagiosum 09-29-2012  . 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 infarction   . Other convulsions   . PE (pulmonary embolism) 04/09/2013   Overview:  Jan 2012   . Personal history of venous thrombosis and embolism   . Secondary  iridocyclitis, infectious   . Septic arthritis of wrist (HCC)    rt  . Shortness of breath dyspnea   . Unspecified hereditary and idiopathic peripheral neuropathy   . 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 Viral URI 03/10/2018    Patient Active Problem List   Diagnosis Date Noted  . Herpes zoster without complication 07/22/2018  . Viral URI 03/10/2018  . Atopic dermatitis 12/30/2017  . Obesity (BMI 30.0-34.9) 08/22/2017  . Diastasis recti 01/24/2017  . Diabetes mellitus, controlled (HCC) 01/25/2016  . Allergic rhinitis 01/25/2016  . Elevated INR   . Gout of right knee 07/28/2015  . HIV disease (HCC) 05/24/2015  . AIDS (HCC) 05/24/2015  . Hypotension 11/22/2014  . Iatrogenic hypotension 11/22/2014  . HIV infection (HCC) 03/08/2014  . History of pulmonary embolus (PE) 03/08/2014  . History of DVT (deep vein thrombosis) 03/08/2014  . H/O deep venous thrombosis 03/08/2014  . 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 H/O thrombosis 03/08/2014  . Long term current use of anticoagulant therapy 02/26/2014  . Coronary atherosclerosis of unspecified type of vessel, native or graft 02/26/2014  . Dermatitis 02/26/2014  . Depressive disorder, not elsewhere classified 02/26/2014  . Asthma, chronic 02/26/2014  . Coronary atherosclerosis 02/26/2014  . Clinical depression 02/26/2014  . Chronic diastolic heart failure (HCC) 04/09/2013  . Chronic cardiopulmonary disease (HCC) 04/09/2013  . Esophageal reflux 04/09/2013  . Essential hypertension 04/09/2013  .  Gout 04/09/2013  . 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 Chronic pulmonary heart disease (HCC) 04/09/2013  . Congestive heart failure (HCC) 04/09/2013  . Convulsions (HCC) 04/09/2013  . Diastolic heart failure (HCC) 04/09/2013  . History of anticoagulant therapy 04/09/2013  . Pulmonary embolism (HCC) 04/09/2013  . Allergy to latex 04/09/2013  . Inguinal hernia 04/09/2013  . 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 Dysplasia of anus  12/29/2012  . Molluscum contagiosum 09/29/2012  . 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 Horseshoe tear of retina 02/13/2012  . Deep vein thrombosis (DVT) (HCC) 05/09/2011  . Airway hyperreactivity 09/08/2010  . Hereditary and idiopathic peripheral neuropathy 09/08/2010  . Human immunodeficiency virus (HIV) disease (HCC) 12/14/2009    Past Surgical History:  Procedure Laterality Date  . COLONOSCOPY    . I&D EXTREMITY Right 07/28/2015   Procedure: IRRIGATION AND DEBRIDEMENT WRIST;  Surgeon: Knute NeuHarrill Coley, MD;  Location: [facility] OR;  Service: Plastics;  Laterality: Right;        Home Medications    Prior to Admission medications   Medication Sig Start Date End Date Taking? Authorizing Provider  ACCU-CHEK AVIVA PLUS test strip  07/16/17   [provider]  albuterol (PROVENTIL HFA;VENTOLIN HFA) 108 (90 BASE) MCG/ACT inhaler Inhale 2 puffs into the lungs every 6 (six) hours as needed for wheezing or shortness of breath.    [provider]  Alcohol Swabs (ALCOHOL PREP) 70 % PADS  07/16/17   [provider]  ASSURE COMFORT LANCETS 30G MISC  07/16/17   [provider]  atorvastatin (LIPITOR) 40 MG tablet Take 40 mg by mouth daily.    [provider]  bictegravir-emtricitabine-tenofovir AF (BIKTARVY) 50-200-25 MG TABS tablet Take 1 tablet by mouth daily. 12/30/17   Randall HissVan Dam, Cornelius N, MD  citalopram (CELEXA) 40 MG tablet Take 40 mg by mouth daily.    [provider]  clobetasol ointment (TEMOVATE) 0.05 % Apply 1 application topically 2 (two) times daily as needed (rash).  10/04/14   [provider]  colchicine 0.6 MG tablet Take 1 tablet (0.6 mg total) by mouth daily. 05/22/18   Glynn Octaveancour, Stephen, MD  Dextromethorphan-Guaifenesin (ROBITUSSIN DM PO) Take by mouth.    [provider]  diclofenac sodium (VOLTAREN) 1 % GEL Apply 2 g topically 4 (four) times daily. 07/10/18   Caccavale, Sophia, PA-C  doxycycline (VIBRAMYCIN) 100  MG capsule Take 1 capsule (100 mg total) by mouth 2 (two) times daily. 07/15/18   Randall HissVan Dam, Cornelius N, MD  fluticasone [facility](FLONASE) 50 MCG/ACT nasal spray USE 1 SPRAY EACH NOSTRIL DAILY AS NEEDED FOR ALLERIES 11/24/15   [provider]  folic acid (FOLVITE) 1 MG tablet Take 4 mg by mouth See admin instructions. Take everyday except on Friday.    [provider]  glipiZIDE (GLUCOTROL XL) 2.5 MG 24 hr tablet Take 2.5 mg by mouth. 12/22/15   [provider]  HYDROcodone-homatropine (HYCODAN) 5-1.5 MG/5ML syrup Take 5 mLs by mouth every 6 (six) hours as needed for cough. 12/09/17   Mardella LaymanHagler, Brian, MD  hydrOXYzine (ATARAX/VISTARIL) 25 MG tablet Take 25 mg by mouth every 6 (six) hours as needed for anxiety.     [provider]  lisinopril (PRINIVIL,ZESTRIL) 2.5 MG tablet Take 2.5 mg by mouth daily.    [provider]  metFORMIN (GLUCOPHAGE) 500 MG tablet Take 1 tablet (500 mg total) by mouth 2 (two) times daily with a meal. 12/22/15   Garlon HatchetSanders, Lisa M, PA-C  metFORMIN (GLUCOPHAGE-XR) 500 MG 24 hr  tablet TK 2 TS PO Q MORNING BEFORE BREAKFAST 07/12/17   [provider]  methotrexate (RHEUMATREX) 2.5 MG tablet Take 8 tablets once a week on Friday. 11/24/15   [provider]  ondansetron (ZOFRAN) 4 MG tablet Take 1 tablet (4 mg total) by mouth every 8 (eight) hours as needed for nausea or vomiting. 03/10/18   Daiva Eves, Lisette Grinder, MD  oxyCODONE-acetaminophen (PERCOCET/ROXICET) 5-325 MG tablet Take 2 tablets by mouth every 4 (four) hours as needed for severe pain. Patient not taking: Reported on 07/22/2018 11/28/16   Long, Arlyss Repress, MD  Phenylephrine-Guaifenesin (MUCUS RELIEF D PO) Take by mouth.    [provider]  predniSONE (DELTASONE) 50 MG tablet As directed Patient not taking: Reported on 07/22/2018 05/22/18   Rancour, Jeannett Senior, MD  terbinafine (LAMISIL) 250 MG tablet  08/28/17   [provider]  triamcinolone ointment (KENALOG) 0.1 % Apply  1 application topically 2 (two) times daily as needed (for itching). On Face 05/12/15   [provider]  valACYclovir (VALTREX) 1000 MG tablet Take 1 tablet by mouth 3 times daily for 7 days. 07/22/18   Veryl Speak, FNP  warfarin (COUMADIN) 5 MG tablet Take 5-7.5 mg by mouth daily. Take 1.5 (7.5mg ) daily except Sunday take 1 tab    [provider]    Family History No family history on file.  Social History Social History   Tobacco Use  . Smoking status: Never Smoker  . Smokeless tobacco: Never Used  Substance Use Topics  . Alcohol use: No    Alcohol/week: 0.0 standard drinks  . Drug use: No     Allergies   Penicillins; Shellfish allergy; and Latex   Review of Systems Review of Systems  Constitutional: Negative for fever.  HENT: Positive for congestion, ear pain and sinus pressure. Negative for ear discharge, sinus pain and sore throat.   Respiratory: Negative for shortness of breath.   Cardiovascular: Negative for chest pain.  Gastrointestinal: Negative for abdominal pain.  Neurological: Negative for headaches.  All other systems reviewed and are negative.    Physical Exam Updated Vital Signs BP (!) 150/101 (BP Location: Right Arm)   Pulse (!) 101   Temp 98.6 F (37 C) (Oral)   Resp 16   SpO2 96%   Physical Exam  Constitutional: He is oriented to person, place, and time. He appears well-developed and well-nourished. No distress.  HENT:  Head: Normocephalic and atraumatic.  Right Ear: Hearing, tympanic membrane, external ear and ear canal normal.  Left Ear: Hearing, external ear and ear canal normal. A middle ear effusion is present.  Nose: Nose normal.  Mouth/Throat: Uvula is midline, oropharynx is clear and moist and mucous membranes are normal.  Eyes: Pupils are equal, round, and reactive to light. Conjunctivae are normal. Right eye exhibits no discharge. Left eye exhibits no discharge. No scleral icterus.  Neck: Normal range of motion.    Cardiovascular: Normal rate and regular rhythm. Exam reveals no friction rub.  No murmur heard. Pulmonary/Chest: Effort normal and breath sounds normal. No respiratory distress.  Abdominal: He exhibits no distension.  Neurological: He is alert and oriented to person, place, and time.  Skin: Skin is warm and dry.  Psychiatric: He has a normal mood and affect. His behavior is normal.  Nursing note and vitals reviewed.    ED Treatments / Results  Labs (all labs ordered are listed, but only abnormal results are displayed) Labs Reviewed - No data to display  EKG  None  Radiology No results found.  Procedures Procedures (including critical care time)  Medications Ordered in ED Medications - No data to display   Initial Impression / Assessment and Plan / ED Course  I have reviewed the triage vital signs and the nursing notes.  Pertinent labs & imaging results that were available during my care of the patient were reviewed by me and considered in my medical decision making (see chart for details).  57 year old male presents with elevated blood pressure.  He has been checking it more often because he recently obtained a blood pressure cuff.  He essentially has asymptomatic hypertension.  Repeat blood pressure here is 138/95.  He does have a middle ear effusion and reports congestion.  We will refill his Flonase for him.  He has a PCP follow-up coming up very soon.  He was given return precautions.  Final Clinical Impressions(s) / ED Diagnoses   Final diagnoses:  Fluid level behind tympanic membrane of left ear  Elevated blood pressure reading    ED Discharge Orders    None       Bethel Born, PA-C 11/29/18 1541    Linwood Dibbles, MD 11/29/18 [phone]

## 2018-12-08 ENCOUNTER — Ambulatory Visit: Admit: 2018-12-08 | Discharge: 2018-12-09 | Payer: MEDICARE

## 2018-12-08 ENCOUNTER — Encounter: Admit: 2018-12-08 | Discharge: 2018-12-09 | Payer: MEDICARE

## 2018-12-08 ENCOUNTER — Encounter
Admit: 2018-12-08 | Discharge: 2018-12-09 | Payer: MEDICARE | Attending: Student in an Organized Health Care Education/Training Program | Primary: Student in an Organized Health Care Education/Training Program

## 2018-12-08 DIAGNOSIS — I2699 Other pulmonary embolism without acute cor pulmonale: Principal | ICD-10-CM

## 2018-12-08 DIAGNOSIS — I10 Essential (primary) hypertension: Secondary | ICD-10-CM

## 2018-12-08 DIAGNOSIS — E1140 Type 2 diabetes mellitus with diabetic neuropathy, unspecified: Secondary | ICD-10-CM

## 2018-12-08 MED ORDER — WARFARIN 5 MG TABLET
ORAL_TABLET | prn refills | 0 days | Status: CP
Start: 2018-12-08 — End: 2019-01-01

## 2018-12-08 NOTE — Unmapped (Signed)
[facility] ([facility])  [facility] Consultation Visit Note                REFERRING PHYSICIAN:    Amy Waynette Buttery, MD  [address]  [id number] [facility]  MEDICINE  [place], [place] [address]    PRIMARY CARE PROVIDER:  Mart Piggs, MD             ASSESSMENT:     This is a 57 y.o. year old male with dysphagia. Differential diagnosis includes but is not limited to structural lesion/mass, stenosis, EoE, Esophageal Dysmotility, Impaired motility, Functional dysphagia, severe GERD, Zenker???s Diverticulum, EGJ outflow obstruction, among others.  Will proceed as follows:    PLAN:    1. Start omeprazole 40 mg daily.  Prescription E faxed to his pharmacy on file.  2. Schedule for upper endoscopy.  Therapeutics such as dilation and/or biopsies will be performed as indicated.  3. Follow the Ups and Downs of Swallowing per AVS.  4.  Return to clinic in 2 months at which time further recommendations will be made pending response/results of above interventions.  Future consideration may be given to an esophageal manometry to look for dysmotility and/or 24-hour pH impedance monitoring to assess for both acidic and nonacidic reflux and its contribution to symptoms.  5. Follow-up per above and as outlined in AVS.  Patient has my information to contact me in the interim with questions and/or concerns.    [ This dictation was done with [facility] [employer]. Document was reviewed but inadvertent errors in transcription may be present ]           CHIEF COMPLAINT:   Ryan Davenport is a 57 y.o. male (DOB:  03-17-1961) who is seen in consultation at the request of Dr Gerhard Munch for dysphagia.    HISTORY OF PRESENT ILLNESS:    Ms Schuneman is a pleasant 57 year old African-American male who presents to clinic for further evaluation of dysphagia.  He denies difficulties with the initiation of a swallow but rather that things get stuck in his mid to lower esophagus after a successful swallowing mechanism.  This happens about 3 times per week and most often with solids, particularly meats and rice.  He's never had the Heimlich maneuver.  When a bolus gets lodged, he can often get it down with the drinking of plenty of fluids.  Sometimes he spontaneously regurgitates the contents.  On rare occasion he must induce regurgitation/vomiting in order to clear a stuck bolus.  He endorses rare heartburn but frequent regurgitation.  He endorses generalized abdominal discomfort which he describes as rumbling and cramping.  He notes poor appetite over the past 6 months but tells me that he is gaining weight.     Ryan Davenport is on warfarin for history of DVT and pulmonary embolism.    Ryan Davenport complains of a worsening bulge in his stomach. He has been seen previously by some provider, and was told it was not a hernia, but was rather a pouch. It does not bother him symptomatically, he just dislikes the way it appears.     Ryan Davenport reports having an upper endoscopy performed in 1984 in [place].  Details of this are unclear and records are unavailable.      PAST MEDICAL HISTORY:  Past Medical History:   Diagnosis Date   ??? Asthma    ??? Asymptomatic human immunodeficiency  virus (HIV) infection status (CMS-HCC) 04/09/2013    Diagnosed in 2005 with a CD4 of 32 and on at least one prior regimen on a study, then switched to Truvada with ritonavir boosted atazanavir. Changed to Truvada with boosted darunavir secondary to reflux symptoms. Has remained undetectable for several years with CD4 count greater than 500.     ??? CAD (coronary artery disease)    ??? Depression    ??? Diabetes mellitus (CMS-HCC)    ??? DVT (deep venous thrombosis) (CMS-HCC)    ??? Eczema    ??? HIV (human immunodeficiency virus infection) (CMS-HCC)    ??? Hyperlipidemia    ??? Myocardial infarction (CMS-HCC)    ??? Obesity    ??? Pulmonary embolism (CMS-HCC) 2012   ??? Sleep apnea      Patient Active Problem List    Diagnosis Date Noted   ??? Disc disease, degenerative, lumbar or lumbosacral 01/08/2018   ??? Obesity (BMI 30.0-34.9) 08/22/2017   ??? Diastasis recti 01/24/2017   ??? Allergic rhinitis 01/25/2016   ??? Controlled type 2 diabetes mellitus, without long-term current use of insulin (CMS-HCC) 01/25/2016   ??? AIDS (CMS-HCC) 05/24/2015   ??? Coronary atherosclerosis (RAF-HCC) 02/26/2014   ??? Chronic eczema 02/26/2014   ??? Depression 02/26/2014   ??? Pulmonary embolism 1/12, long-term anticoag per hematology 04/09/2013   ??? Allergy to latex 04/09/2013   ??? Chronic cardiopulmonary disease (CMS-HCC) 04/09/2013   ??? Chronic diastolic heart failure (CMS-HCC) 04/09/2013   ??? Enthesopathy of knee 04/09/2013   ??? Esophageal reflux 04/09/2013   ??? Gout 04/09/2013   ??? Essential hypertension 04/09/2013   ??? 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   ??? 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   ??? Convulsions (CMS-HCC) 04/09/2013   ??? Inguinal hernia 04/09/2013   ??? Acid reflux (RAF-HCC) 04/09/2013   ??? Dysplasia of anus 12/29/2012   ??? Molluscum contagiosum 09/29/2012   ??? 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 Horseshoe tear of retina 02/13/2012   ??? Dermatitis 09/08/2010   ??? Hereditary and idiopathic peripheral neuropathy 09/08/2010   ??? 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 Human immunodeficiency virus (HIV) disease (CMS-HCC) 12/14/2009        PAST SURGICAL HISTORY:  Past Surgical History:   Procedure Laterality Date   ??? PR COLONOSCOPY FLX DX W/COLLJ SPEC WHEN PFRMD N/A 11/11/2013    Procedure: COLONOSCOPY, FLEXIBLE, PROXIMAL TO SPLENIC FLEXURE; DIAGNOSTIC, W/WO COLLECTION SPECIMEN BY BRUSH OR WASH;  Surgeon: Romero Belling, MD;  [facility];  Service: Gastroenterology   ??? SKIN BIOPSY         ALLERGIES:  Allergies   Allergen Reactions   ??? Shellfish Containing Products      Other reaction(s): SHORTNESS OF BREATH   ??? Latex Rash   ??? Penicillins Rash       MEDICATIONS:  Patient's Medications   New Prescriptions    OMEPRAZOLE (PRILOSEC) 40 MG CAPSULE    Take 1 capsule (40 mg total) by mouth daily.   Previous Medications    ALCOHOL SWABS (ALCOHOL PREP PADS) PADM        ALCOHOL SWABS PADM    Use to check blood sugars once daily. ICD-10 E11.9 (Type 2 DM). Dispense insurance approved    ATORVASTATIN (LIPITOR) 40 MG TABLET    Take 1 tablet (40 mg total) by mouth daily.    BETAMETHASONE DIPROPIONATE (DIPROLENE) 0.05 % OINTMENT    Apply topically Two (2) times a day. Apply to problem areas on back    BIKTARVY  50-200-25 MG TABLET    Take 1 tablet by mouth daily at 0600.    BLOOD SUGAR DIAGNOSTIC (ACCU-CHEK AVIVA PLUS TEST STRP) STRP    by Other route once daily. ICD-10 E11.9    BLOOD SUGAR DIAGNOSTIC (ACCU-CHEK AVIVA PLUS TEST STRP) STRP        BLOOD-GLUCOSE METER KIT    Use to check blood sugars once daily. ICD-10 E11.9 (Type 2 DM). Dispense insurance approved    CETIRIZINE (ZYRTEC) 10 MG TABLET    Take 1 tablet (10 mg total) by mouth daily. Take in the morning for itching    CLOBETASOL (TEMOVATE) 0.05 % OINTMENT    Apply topically Two (2) times a day. For stubborn lesions on the feet    COLCHICINE (COLCRYS) 0.6 MG TABLET    Take 1 tablet (0.6 mg total) by mouth daily.    DICLOFENAC SODIUM (VOLTAREN) 1 % GEL    Apply 2 g topically.    DUPILUMAB (DUPIXENT) 300 MG/2 ML SYRG INJECTION    INJECT THE CONTENTS OF 2 SYRINGES (600MG ) UNDER THE SKIN ON DAY 0 THEN INJECT THE CONTENTS OF 1 SYRINGE (300MG ) EVERY 14 DAYS    EMPTY CONTAINER MISC    USE AS DIRECTED    FLUTICASONE PROPIONATE (FLONASE) 50 MCG/ACTUATION NASAL SPRAY    USE 1 SPRAY EACH NOSTRIL DAILY AS NEEDED FOR ALLERIES    GLIPIZIDE (GLUCOTROL XL) 2.5 MG 24 HR TABLET    Take 1 tablet (2.5 mg total) by mouth daily.    HYDROXYZINE (ATARAX) 25 MG TABLET        LANCETS 30 GAUGE MISC        LISINOPRIL (PRINIVIL,ZESTRIL) 2.5 MG TABLET    Take 1 tablet (2.5 mg total) by mouth daily.    METFORMIN (GLUCOPHAGE) 1000 MG TABLET    Take 1 tablet (1,000 mg total) by mouth 2 (two) times a day with meals. ONDANSETRON (ZOFRAN-ODT) 4 MG DISINTEGRATING TABLET    Take 1 tablet (4 mg total) by mouth every eight (8) hours as needed for nausea. for up to 7 days    PHENYLEPHRINE-ACETAMINOPHEN-GG 5-325-200 MG TAB    Take by mouth.    PREDNISONE (DELTASONE) 50 MG TABLET    As directed    SERTRALINE (ZOLOFT) 50 MG TABLET    Take 1 tablet (50 mg total) by mouth daily.    TERBINAFINE HCL (LAMISIL) 250 MG TABLET        TRIAMCINOLONE (KENALOG) 0.1 % CREAM    APPLY TO THE AFFECTED AREA TWICE DAILY    TRIAMCINOLONE (KENALOG) 0.1 % OINTMENT    Apply twice a day to affected areas    VALACYCLOVIR (VALTREX) 1000 MG TABLET    Take 1 tablet by mouth 3 times daily for 7 days.    WARFARIN (COUMADIN) 5 MG TABLET    5mg  (1 tab) Sat, 7.5mg  (1.5 tabs) other days or as directed   Modified Medications    No medications on file   Discontinued Medications    CLOBETASOL (TEMOVATE) 0.05 % OINTMENT    Apply topically.    FLUTICASONE PROPIONATE (FLONASE) 50 MCG/ACTUATION NASAL SPRAY    PLACE 1 SPRAY INTO BOTH NOSTRILS QD    HYDROXYZINE (ATARAX) 10 MG TABLET    Take 1 tablet (10 mg total) by mouth nightly as needed for itching.    NYSTATIN (MYCOSTATIN) 100,000 UNIT/ML SUSPENSION    Take 5 mL (500,000 Units total) by mouth Four (4) times a day.       SOCIAL HISTORY:  Social History     Socioeconomic History   ??? Marital status: Single     Spouse name: None   ??? Number of children: None   ??? Years of education: None   ??? Highest education level: None   Occupational History   ??? None   Social Needs   ??? Financial resource strain: None   ??? Food insecurity:     Worry: None     Inability: None   ??? Transportation needs:     Medical: None     Non-medical: None   Tobacco Use   ??? Smoking status: Never Smoker   ??? Smokeless tobacco: Never Used   Substance and Sexual Activity   ??? Alcohol use: No     Alcohol/week: 0.0 standard drinks   ??? Drug use: No   ??? Sexual activity: None   Lifestyle   ??? Physical activity:     Days per week: None     Minutes per session: None   ??? Stress: None   Relationships   ??? Social connections:     Talks on phone: None     Gets together: None     Attends religious service: None     Active member of club or organization: None     Attends meetings of clubs or organizations: None     Relationship status: None   Other Topics Concern   ??? Do you use sunscreen? No   ??? Tanning bed use? No   ??? Are you easily burned? No   ??? Excessive sun exposure? No   ??? Blistering sunburns? No   Social History Narrative    Single.    No children    Works 2 days per week.  [employer] in [place]       FAMILY HISTORY:  Family History   Problem Relation Age of Onset   ??? Diabetes Mother    ??? Heart disease Mother    ??? Hypertension Mother    ??? Arthritis Mother    ??? Aneurysm Father    ??? Skin cancer Neg Hx    ??? Melanoma Neg Hx    ??? Basal cell carcinoma Neg Hx    ??? Squamous cell carcinoma Neg Hx        REVIEW OF SYSTEMS:    Pertinent positive and negatives are documented as per the HPI; all other systems reviewed are negative.    VITAL SIGNS:  BP 132/78  - Pulse 99  - Ht 182.9 cm (6')  - Wt (!) 116.4 kg (256 lb 9.6 oz)  - SpO2 96%  - BMI 34.80 kg/m??     PHYSICAL EXAM:  CONSTITUTIONAL:  Well appearing, well developed, well-nourished - in no acute distress.  EYES:  PERRL.  Conjunctivae clear, anicteric.  No lid lesions.  ENT:  Absent lower dentition; oral mucosal membranes moist without lesions, erythema, or exudates. Nares without discharge.  No thyromegaly.  CARDIOVASULAR: Regular rate and rhythm.  Normal S1 and S2.  No murmurs/rubs/gallops.  RESPIRATORY:  Normal / unlabored respiratory effort. CTA bilaterally without wheezes or rhonchi.  GASTROINTESTINAL:  Bowel sounds present x 4 quadrants.  Abdomen is soft, nontender.  Bulge above umbilicus, not reducible.  Rectal exam deferred.  GENITOURINARY:  Deferred  MUSCULOSKELETAL:  Normal ROM throughout.  No joint swelling or tenderness noted.  No deformities.  SKIN:  Warm, dry, well-perfused.  No edema, clubbing or cyanosis.  No rashes, jaundice or skin lesions noted.  NEUROLOGIC: Alert and oriented x  3.  Normal gait and station.  No focal deficits.  PSYCHIATRIC:  Estate agent. Thought, judgment and insight adequate.  Mood and affect congruent.    HEME / LYMPH / IMMUNOLOGIC:  No cervical, submandibular, or supraclavicular adenopathy.

## 2018-12-08 NOTE — Unmapped (Deleted)
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    Needs: PHQ, serum K, Cr  - GI appointment tomorrow for dysphagia  - Sugars? On glipizide, increased metformin in October for A1c 8.1 (repeat ~1 month)  - ECHO 11/8: mild LVH, EF 50-55%, grade 1 diastolic dysfunction (unchanged)    Diagnoses and all orders for this visit:    Essential hypertension    Depression, unspecified depression type    Controlled type 2 diabetes mellitus without complication, without long-term current use of insulin (CMS-HCC)    Chronic diastolic heart failure (CMS-HCC)        No problem-specific Assessment & Plan notes found for this encounter.      __________________________________________________________    HPI:    ***    ROS: Balance of ten systems is negative.    __________________________________________________________    Problem List:  Patient Active Problem List   Diagnosis   ??? 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 Allergy to latex   ??? Chronic cardiopulmonary disease (CMS-HCC)   ??? Chronic diastolic heart failure (CMS-HCC)   ??? Cytomegaloviral disease (CMS-HCC)   ??? Dermatitis   ??? Dysplasia of anus   ??? Enthesopathy of knee   ??? Esophageal reflux   ??? Gout   ??? Horseshoe tear of retina   ??? Human immunodeficiency virus (HIV) disease (CMS-HCC)   ??? Essential hypertension   ??? Molluscum contagiosum   ??? Mononeuritis   ??? 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 infarction   ??? Convulsions (CMS-HCC)   ??? Hereditary and idiopathic peripheral neuropathy   ??? Inguinal hernia   ??? Early syphilis, secondary syphilis   ??? Coronary atherosclerosis (RAF-HCC)   ??? Acid reflux (RAF-HCC)   ??? Chronic eczema   ??? Depression   ??? Allergic rhinitis   ??? Controlled type 2 diabetes mellitus, without long-term current use of insulin (CMS-HCC)   ??? Diastasis recti   ??? AIDS (CMS-HCC)   ??? Obesity (BMI 30.0-34.9)   ??? Disc disease, degenerative, lumbar or lumbosacral       Allergies:  Allergies   Allergen Reactions   ??? Shellfish Containing Products      Other reaction(s): SHORTNESS OF BREATH   ??? Latex Rash   ??? Penicillins Rash        Medications:  Current Outpatient Medications   Medication Sig Dispense Refill   ??? alcohol swabs PadM Use to check blood sugars once daily. ICD-10 E11.9 (Type 2 DM). Dispense insurance approved 100 each 3   ??? atorvastatin (LIPITOR) 40 MG tablet Take 1 tablet (40 mg total) by mouth daily. 90 tablet 3   ??? betamethasone dipropionate (DIPROLENE) 0.05 % ointment Apply topically Two (2) times a day. Apply to problem areas on back 45 g 3   ??? BIKTARVY 50-200-25 mg tablet Take 1 tablet by mouth daily at 0600.  11   ??? blood sugar diagnostic (ACCU-CHEK AVIVA PLUS TEST STRP) Strp by Other route once daily. ICD-10 E11.9 100 strip 3   ??? blood-glucose meter kit Use to check blood sugars once daily. ICD-10 E11.9 (Type 2 DM). Dispense insurance approved 1 each 0   ??? cetirizine (ZYRTEC) 10 MG tablet Take 1 tablet (10 mg total) by mouth daily. Take in the morning for itching 90 tablet  3   ??? clobetasol (TEMOVATE) 0.05 % ointment Apply topically Two (2) times a day. For stubborn lesions on the feet 60 g 3   ??? colchicine (COLCRYS) 0.6 mg tablet Take 1 tablet (0.6 mg total) by mouth daily. 90 tablet 2   ??? dupilumab (DUPIXENT) 300 mg/2 mL Syrg injection INJECT THE CONTENTS OF 2 SYRINGES (600MG ) UNDER THE SKIN ON DAY 0 THEN INJECT THE CONTENTS OF 1 SYRINGE (300MG ) EVERY 14 DAYS 4 mL 6   ??? empty container Misc USE AS DIRECTED 1 each 2   ??? glipiZIDE (GLUCOTROL XL) 2.5 MG 24 hr tablet Take 1 tablet (2.5 mg total) by mouth daily. 90 tablet 3   ??? lisinopril (PRINIVIL,ZESTRIL) 2.5 MG tablet Take 1 tablet (2.5 mg total) by mouth daily. 90 tablet 3   ??? metFORMIN (GLUCOPHAGE) 1000 MG tablet Take 1 tablet (1,000 mg total) by mouth 2 (two) times a day with meals. 180 tablet 3   ??? nystatin (MYCOSTATIN) 100,000 unit/mL suspension Take 5 mL (500,000 Units total) by mouth Four (4) times a day. 60 mL 1   ??? sertraline (ZOLOFT) 50 MG tablet Take 1 tablet (50 mg total) by mouth daily. 90 tablet 3   ??? triamcinolone (KENALOG) 0.1 % cream APPLY TO THE AFFECTED AREA TWICE DAILY 454 g 0   ??? triamcinolone (KENALOG) 0.1 % ointment Apply twice a day to affected areas 454 g 6   ??? warfarin (COUMADIN) 5 MG tablet 7.5mg  (1.5 tabs) daily or as directed 60 tablet prn     No current facility-administered medications for this visit.        Pertinent Family and Social History: ***  __________________________________________________________    Physical Exam:   Vital Signs:  There were no vitals filed for this visit.    Gen: Well appearing, NAD.  ENT: OP clear without erythema or exudate.   Neck: No cervical LAD, No thyromegaly or nodules, No JVD.  CV: RRR, no murmurs.  Pulm: Normal effort, CTA throughout, no wheezing.  Abd: Soft, NT, ND, normal BS. No HSM.  Ext: Warm, No edema  Skin: No atypical appearing moles. No rashes. Brisk capillary refill.  Neuro: CNIII-XII intact. Sensation intact. Strength normal. Reflexes 2+ and equal bilaterally.  Psych: Normal mood and affect.    ----------------------------------------  Chuck Hint, MD  [facility] Internal Medicine, PGY-2

## 2018-12-08 NOTE — Unmapped (Signed)
Assessment/Plan:     Current warfarin Dose:  5mg  TRSa  7.5mg  others (45mg /wk)  Steady State:yes  Date INR: December 08, 2018  Goal INR: 2-3  INR Results: 1.8  Lab Results   Component Value Date    INR, POC 1.8 12/08/2018    INR, POC 2.5 11/04/2018    INR, POC 3.8 10/16/2018    INR, POC 2.60 05/28/2017    INR, POC 2.7 03/30/2015    INR, POC 2.1 02/23/2015       ASSESSMENT/PLAN:  Plan for Warfarin Dose:  increase dose: 5mg  Sa  7.5mg  others (50mg /wk)    1. INR subtherapeutic, previous h/o stability on 52.5mg /wk.  If having endoscopy tomorrow, appears that he will be maintaining warfarin periop and if so, current INR likely appropriate for a surveillance procedure  2. htn - 120/70 today, continue lisinopril 2.5mg  daily, recheck next visit given recently uncontrolled   3. DM2 - a1c 8.1 in 10/19, plan to repeat in 1/20.  + proteinuria, continue ACEI.    4. Time - >12min spent w/ >50% in face-to-face education/counselling and treatment planning regarding above issues.    Repeat INR Date:  3 wks    Subject/Objective:     Patient: Ryan Davenport 57 y.o.    Medical Record Number:  [id number] C    PRIMARY CARE PHYSICIAN: Mart Piggs, MD    HY:QMVHQION VTE, DM, htn    PMHx:   Patient Active Problem List   Diagnosis   ??? 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 Allergy to latex   ??? Chronic cardiopulmonary disease (CMS-HCC)   ??? Chronic diastolic heart failure (CMS-HCC)   ??? Cytomegaloviral disease (CMS-HCC)   ??? Dermatitis   ??? Dysplasia of anus   ??? Enthesopathy of knee   ??? Esophageal reflux   ??? Gout   ??? Horseshoe tear of retina   ??? Human immunodeficiency virus (HIV) disease (CMS-HCC)   ??? Essential hypertension   ??? Molluscum contagiosum   ??? Mononeuritis   ??? 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 infarction   ??? Convulsions (CMS-HCC)   ??? Hereditary and idiopathic peripheral neuropathy   ??? Inguinal hernia   ??? Early syphilis, secondary syphilis   ??? Coronary atherosclerosis (RAF-HCC)   ??? Acid reflux (RAF-HCC)   ??? Chronic eczema   ??? Depression   ??? Allergic rhinitis   ??? Controlled type 2 diabetes mellitus, without long-term current use of insulin (CMS-HCC)   ??? Diastasis recti   ??? AIDS (CMS-HCC)   ??? Obesity (BMI 30.0-34.9)   ??? Disc disease, degenerative, lumbar or lumbosacral          Your Medication List          Accurate as of December 08, 2018  2:09 PM. If you have any questions, ask your nurse or doctor.            STOP taking these medications    dextromethorphan-guaifenesin 10-100 mg/5 mL liquid  Commonly known as:  ROBITUSSIN-DM  Stopped by:  Dwana Melena, PA     doxycycline 100 MG capsule  Commonly known as:  VIBRAMYCIN  Stopped by:  Dwana Melena, PA     methotrexate 2.5 MG tablet  Stopped by:  Dwana Melena, PA        CHANGE how you take these medications    metFORMIN 1000 MG tablet  Commonly known as:  GLUCOPHAGE  Take 1 tablet (1,000 mg total) by mouth 2 (two) times a day with meals.  What changed:  Another medication with the same name was removed. Continue taking this medication, and follow the directions you see here.  Changed by:  Dwana Melena, PA     warfarin 5 MG tablet  Commonly known as:  COUMADIN  5mg  (1 tab) Sat, 7.5mg  (1.5 tabs) other days or as directed  What changed:  additional instructions  Changed by:  Dwana Melena, PA        CONTINUE taking these medications    alcohol swabs Padm  Use to check blood sugars once daily. ICD-10 E11.9 (Type 2 DM). Dispense insurance approved     ALCOHOL PREP PADS Padm  Generic drug:  alcohol swabs     atorvastatin 40 MG tablet  Commonly known as:  LIPITOR  Take 1 tablet (40 mg total) by mouth daily.     betamethasone dipropionate 0.05 % ointment  Commonly known as:  DIPROLENE  Apply topically Two (2) times a day. Apply to problem areas on back     BIKTARVY 50-200-25 mg tablet  Generic drug:  bictegrav-emtricit-tenofov ala  Take 1 tablet by mouth daily at 0600.     blood sugar diagnostic Strp  Commonly known as:  ACCU-CHEK AVIVA PLUS TEST STRP  by Other route once daily. ICD-10 E11.9     ACCU-CHEK AVIVA PLUS TEST STRP Strp  Generic drug:  blood sugar diagnostic     blood-glucose meter kit  Use to check blood sugars once daily. ICD-10 E11.9 (Type 2 DM). Dispense insurance approved     cetirizine 10 MG tablet  Commonly known as:  ZyrTEC  Take 1 tablet (10 mg total) by mouth daily. Take in the morning for itching     clobetasol 0.05 % ointment  Commonly known as:  TEMOVATE  Apply topically.     clobetasol 0.05 % ointment  Commonly known as:  TEMOVATE  Apply topically Two (2) times a day. For stubborn lesions on the feet     colchicine 0.6 mg tablet  Commonly known as:  COLCRYS  Take 1 tablet (0.6 mg total) by mouth daily.     diclofenac sodium 1 % gel  Commonly known as:  VOLTAREN  Apply 2 g topically.     DUPIXENT 300 mg/2 mL Syrg injection  Generic drug:  dupilumab  INJECT THE CONTENTS OF 2 SYRINGES (600MG ) UNDER THE SKIN ON DAY 0 THEN INJECT THE CONTENTS OF 1 SYRINGE (300MG ) EVERY 14 DAYS     empty container Misc  USE AS DIRECTED     fluticasone propionate 50 mcg/actuation nasal spray  Commonly known as:  FLONASE  USE 1 SPRAY EACH NOSTRIL DAILY AS NEEDED FOR ALLERIES     fluticasone propionate 50 mcg/actuation nasal spray  Commonly known as:  FLONASE  PLACE 1 SPRAY INTO BOTH NOSTRILS QD     glipiZIDE 2.5 MG 24 hr tablet  Commonly known as:  GLUCOTROL XL  Take 1 tablet (2.5 mg total) by mouth daily.     hydrOXYzine 25 MG tablet  Commonly known as:  ATARAX     lancets 30 gauge Misc     lisinopril 2.5 MG tablet  Commonly known as:  PRINIVIL,ZESTRIL  Take 1 tablet (2.5 mg total) by mouth daily.     nystatin 100,000 unit/mL suspension  Commonly known as:  MYCOSTATIN  Take 5 mL (500,000 Units total) by mouth Four (4) times a day.     phenylephrine-acetaminophen-GG 5-325-200 mg Tab  Take by mouth.     predniSONE 50  MG tablet  Commonly known as:  DELTASONE  As directed     sertraline 50 MG tablet  Commonly known as:  ZOLOFT  Take 1 tablet (50 mg total) by mouth daily.     terbinafine HCl 250 mg tablet  Commonly known as:  LamiSIL     triamcinolone 0.1 % ointment  Commonly known as:  KENALOG  Apply twice a day to affected areas     triamcinolone 0.1 % cream  Commonly known as:  KENALOG  APPLY TO THE AFFECTED AREA TWICE DAILY     valACYclovir 1000 MG tablet  Commonly known as:  VALTREX  Take 1 tablet by mouth 3 times daily for 7 days.              General:  Well appearing, no acute distress    Signs/Symptoms bleeding or bruising: no    Signs/Symptoms of Potential Embolic Events: no    Changes In:   Diet:no   Medications:no   Activities / Lifestyle:no   Health Status:no   Nonadherent: no       Current EtOH Use:no   Current Smoker/tobacco user: no    Medications reviewed including OTC and herbals:yes    The patient set a personal goal for anticoagulation management today:Take warfarin daily as recommended.    I have reviewed and addressed the patient???s adherence and response to prescribed medications. I have identified patient barriers to following the proposed medication and treatment plan, and have noted opportunities to optimize healthy behaviors. I have answered the patient???s questions to satisfaction and the patient voices understanding.        Additional History:

## 2018-12-09 ENCOUNTER — Ambulatory Visit
Admit: 2018-12-09 | Discharge: 2018-12-10 | Payer: MEDICARE | Attending: Nurse Practitioner | Primary: Nurse Practitioner

## 2018-12-09 DIAGNOSIS — R1310 Dysphagia, unspecified: Principal | ICD-10-CM

## 2018-12-09 MED ORDER — OMEPRAZOLE 40 MG CAPSULE,DELAYED RELEASE
ORAL_CAPSULE | Freq: Every day | ORAL | 3 refills | 0.00000 days | Status: CP
Start: 2018-12-09 — End: ?

## 2018-12-09 NOTE — Unmapped (Signed)
[facility] ([facility])  [facility] Faculty Consultation Visit Note              It was good to meet you today.  My recommendations are as follows:  1.  Start omeprazole 40 mg daily, to be taken about 30 minutes before breakfast.  Prescription sent to pharmacy on file.  2.  Someone will be getting in touch with you to get you scheduled for an upper endoscopy.  Therapeutics such as dilation and/or biopsies will be performed as indicated.  3.  Return to clinic in 2 months at which time further recommendations will be made pending response/results of above interventions.  Future consideration may be given to do esophageal manometry to rule out dysmotility and/or 24-hour pH impedance monitoring to assess for both acidic and nonacidic reflux and its contribution to symptoms.    Please try the following Ups and Downs of Swallowing     1.  Sit UP when eating: Sitting up will allow the esophagus to gain the most benefit out of one of nature???s most basic principle, gravity. Paying attention to posture will also help straight the esophagus.   2.  Slow DOWN, do not rush when eating  3.  Chew UP your food very carefully   4.  Drink DOWN a full glass of water with your pills   5.  Cut UP your food into very small pieces   6.  Cut DOWN on tough foods to chew (meats)    It is important that you remain active in your healthcare.  I've outlined recommendations from today's clinic visit. If further work-up, tests or consultations are recommended, you should be contacted to get these scheduled.  If for any reason, you do NOT hear from someone within 7 days to get these scheduled, you should reach out to me via [facility] MyChart or at one of the numbers below and together, we'll ensure you get the appropriate appointments.   Again, you need to take responsibility for your healthcare by ensuring appropriate follow-up.  I am here to assist you in getting the healthcare that you need and deserve.    Notes and/or results from recommended tests or consultation are to be sent to me, as the ordering provider.  After I receive these results, I will follow-up with you as appropriate by [facility] MyChart, phone, letter or at your scheduled follow-up clinic appointment.    If for whatever reason you do not hear from me within 7-10 days after you've completed any tests/work-up, but do require further recommendations and/or instructions based on the results prior to a scheduled follow-up clinic visit, please contact me.    Please don't hesitate to reach out to me if you have questions or concerns in the interim.      Important phone numbers:    Office line:  [phone]    Nurse line:  [phone] [facility] [place])    GI Clinic Appointments AND GI Procedure Appointments:  [phone]    Radiology: [phone], option 3 or 4  If you are being scheduled for any type of radiology, you will need to call to receive your appointment time.  Please call this number for information.     For emergencies after normal business hours or on weekends/holidays   Proceed to the nearest emergency room OR contact the [facility] Operator at [phone] who can page the Gastroenterology Fellow on call.

## 2018-12-12 MED ORDER — DUPILUMAB 300 MG/2 ML SUBCUTANEOUS SYRINGE
6 refills | 0 days | Status: CP
Start: 2018-12-12 — End: 2018-12-15

## 2018-12-15 ENCOUNTER — Encounter: Admit: 2018-12-15 | Discharge: 2018-12-16 | Payer: MEDICARE

## 2018-12-15 DIAGNOSIS — L209 Atopic dermatitis, unspecified: Principal | ICD-10-CM

## 2018-12-15 MED ORDER — CETIRIZINE 10 MG TABLET
ORAL_TABLET | Freq: Every day | ORAL | 3 refills | 0.00000 days | Status: CP
Start: 2018-12-15 — End: ?

## 2018-12-15 MED ORDER — DUPILUMAB 300 MG/2 ML SUBCUTANEOUS SYRINGE
SUBCUTANEOUS | 6 refills | 0.00000 days | Status: CP
Start: 2018-12-15 — End: 2019-05-22

## 2018-12-15 MED ORDER — ALCLOMETASONE 0.05 % TOPICAL OINTMENT
Freq: Two times a day (BID) | TOPICAL | 3 refills | 0.00000 days | Status: CP
Start: 2018-12-15 — End: 2019-12-15

## 2018-12-15 NOTE — Unmapped (Signed)
Patient Education        Atopic Dermatitis: Care Instructions  Your Care Instructions  Atopic dermatitis (also called eczema) is a skin problem that causes intense itching and a red, raised rash. In severe cases, the rash develops clear fluid???filled blisters. The rash is not contagious. People with this condition seem to have very sensitive immune systems that are likely to react to things that cause allergies. The immune system is the body's way of fighting infection.  There is no cure for atopic dermatitis, but you may be able to control it with care at home.  Follow-up care is a key part of your treatment and safety. Be sure to make and go to all appointments, and call your doctor if you are having problems. It's also a good idea to know your test results and keep a list of the medicines you take.  How can you care for yourself at home?  ?? Use moisturizer at least twice a day.  ?? If your doctor prescribes a cream, use it as directed. If your doctor prescribes other medicine, take it exactly as directed.  ?? Wash the affected area with water only. Soap can make dryness and itching worse. Pat dry.  ?? Apply a moisturizer after bathing. Use a cream such as Lubriderm, Moisturel, or Cetaphil that does not irritate the skin or cause a rash. Apply the cream while your skin is still damp after lightly drying with a towel.  ?? Use cold, wet cloths to reduce itching.  ?? Keep cool, and stay out of the sun.  ?? If itching affects your normal activities, an over-the-counter antihistamine, such as diphenhydramine (Benadryl) or loratadine (Claritin) may help. Read and follow all instructions on the label.  When should you call for help?  Call your doctor now or seek immediate medical care if:  ?? ?? Your rash gets worse and you have a fever.   ?? ?? You have new blisters or bruises, or the rash spreads and looks like a sunburn.   ?? ?? You have signs of infection, such as:  ? Increased pain, swelling, warmth, or redness.  ? Red streaks leading from the rash.  ? Pus draining from the rash.  ? A fever.   ?? ?? You have crusting or oozing sores.   ?? ?? You have joint aches or body aches along with your rash.   ??Watch closely for changes in your health, and be sure to contact your doctor if:  ?? ?? Your rash does not clear up after 2 to 3 weeks of home treatment.   ?? ?? Itching interferes with your sleep or daily activities.   Where can you learn more?  Go to [facility] at https://myuncchart.org  [facility] under [employer]. Enter [phone] in the search box to learn more about Atopic Dermatitis: Care Instructions.  Current as of: March 24, 2018  Content Version: 12.3  ?? 2006-2019 [employer]. Care instructions adapted under license by [facility]. If you have questions about a medical condition or this instruction, always ask your healthcare professional. [employer], Inc[employer] disclaims any warranty or liability for your use of this information.

## 2018-12-15 NOTE — Unmapped (Signed)
Dermatology Follow-up Note    A/P:    Atopic Dermatitis: Previously severe, now clearing up on dupilumab  - Cont dupilumab  - continue TAC ointment as needed  - continue zyrtec 10mg  qPM, continue atarax 10mg  nightly as needed    High risk med use: Dupilumab  - No monitoring required at this time    Seb derm of the face:  - start alclomethasone ointment instead of TAC daily as needed    RTC 6 months      CC:  Follow up AD    HPI:  Ryan Davenport is a 57 year old male with PMH of HIV last seen by me on 06/2018 for atopic dermatitis.  Patient has been now on Dupixent for about 10 months with good improvement.  Off of methotrexate since that time also.  He refers his longstanding chronic lesions are clearing up.  Using triamcinolone ointment as needed.  For his seborrheic dermatitis on the face he is also using triamcinolone ointment as needed  There are no other skin concerns      Pertinent PMH:  No history of skin cancer  HIV, well controlled, on HAART  Atopic dermatitis Previously on MTX d/c 12/2017 (Fibroscan 07/2016 w/ F0, at cumulative dose of 3180 mg), now on dupilumab    SH:  Lives in [place], [place]    ROS:   No fever, chills, or skin concerns.    PE:  General: Well-developed, well-nourished male, in no acute distress.   Neuro: Alert and oriented, and answers questions appropriately.  Skin: Per patient request, focused exam with inspection and palpation of the  face, neck, bilateral upper and lower extremities was performed today. Findings were normal with exception of the following:  -Mild erythema and scaliness of eyebrows  -Pigmented patches on bilateral upper and lower extremities  - All other areas examined were normal or had no significant findings

## 2018-12-19 NOTE — Unmapped (Signed)
Patient wants to get Dupixent from [facility] from now own. He knows to call the [facility] if he even needs anything.  Disenrolled from specialty services at this time.     Regis Bill, PharmD, BCGP, CSP  [facility]  [address]  [place], [place] [address]  ph: [phone]  f: [phone]

## 2018-12-19 NOTE — Unmapped (Signed)
[facility] Refill Coordination Note  Medication: dupixent [rx d/c here was [facility] to [facility]- called to see if he was ok with [facility] or wanted to continue to fill with us]    Unable to reach patient to schedule shipment for medication being filled at [facility]. Left voicemail on phone.  As this is the 3rd unsuccessful attempt to reach the patient, no additional phone call attempts will be made at this time.      Phone numbers attempted: [phone] (left msg with mom), [phone]  Last scheduled delivery: 11/17/18 (28 days)    Please call the [facility] at [phone] (option 4) should you have any further questions.      Thanks,  [facility] Specialty Team

## 2018-12-21 ENCOUNTER — Other Ambulatory Visit: Payer: Self-pay | Admitting: Infectious Disease

## 2018-12-21 DIAGNOSIS — B20 Human immunodeficiency virus [HIV] disease: Secondary | ICD-10-CM

## 2018-12-22 ENCOUNTER — Other Ambulatory Visit: Payer: Self-pay

## 2018-12-22 DIAGNOSIS — B20 Human immunodeficiency virus [HIV] disease: Secondary | ICD-10-CM

## 2018-12-22 MED ORDER — BICTEGRAVIR-EMTRICITAB-TENOFOV 50-200-25 MG PO TABS: 1.0000 | ORAL_TABLET | Freq: Every day | ORAL | 0 refills | Status: DC

## 2019-01-01 ENCOUNTER — Encounter: Admit: 2019-01-01 | Discharge: 2019-01-02 | Payer: MEDICARE

## 2019-01-01 DIAGNOSIS — I10 Essential (primary) hypertension: Secondary | ICD-10-CM

## 2019-01-01 DIAGNOSIS — E1140 Type 2 diabetes mellitus with diabetic neuropathy, unspecified: Secondary | ICD-10-CM

## 2019-01-01 DIAGNOSIS — I2699 Other pulmonary embolism without acute cor pulmonale: Principal | ICD-10-CM

## 2019-01-01 MED ORDER — WARFARIN 5 MG TABLET
ORAL_TABLET | prn refills | 0 days | Status: CP
Start: 2019-01-01 — End: 2019-01-29

## 2019-01-01 MED ORDER — COLCHICINE 0.6 MG TABLET
ORAL_TABLET | Freq: Every day | ORAL | 3 refills | 0 days | Status: CP
Start: 2019-01-01 — End: ?

## 2019-01-01 MED ORDER — GLIPIZIDE ER 5 MG TABLET, EXTENDED RELEASE 24 HR
ORAL_TABLET | Freq: Every day | ORAL | 3 refills | 0 days | Status: CP
Start: 2019-01-01 — End: 2019-04-22

## 2019-01-08 ENCOUNTER — Other Ambulatory Visit: Payer: Medicare Other

## 2019-01-08 DIAGNOSIS — B20 Human immunodeficiency virus [HIV] disease: Secondary | ICD-10-CM

## 2019-01-09 LAB — T-HELPER CELL (CD4) - (RCID CLINIC ONLY)
CD4 % Helper T Cell: 33 % (ref 33–55)
CD4 T Cell Abs: 640 /uL (ref 400–2700)

## 2019-01-10 LAB — RPR TITER: RPR Titer: 1:16 {titer} — ABNORMAL HIGH

## 2019-01-10 LAB — CBC
HCT: 41.7 % (ref 38.5–50.0)
Hemoglobin: 13.8 g/dL (ref 13.2–17.1)
MCH: 28.8 pg (ref 27.0–33.0)
MCHC: 33.1 g/dL (ref 32.0–36.0)
MCV: 87.1 fL (ref 80.0–100.0)
MPV: 11 fL (ref 7.5–12.5)
Platelets: 200 10*3/uL (ref 140–400)
RBC: 4.79 10*6/uL (ref 4.20–5.80)
RDW: 13.1 % (ref 11.0–15.0)
WBC: 4.9 10*3/uL (ref 3.8–10.8)

## 2019-01-10 LAB — LIPID PANEL
CHOL/HDL RATIO: 3.2 (calc) (ref <5.0)
Cholesterol: 118 mg/dL (ref <200)
HDL: 37 mg/dL — ABNORMAL LOW (ref >40)
LDL CHOLESTEROL (CALC): 59 mg/dL
NON-HDL CHOLESTEROL (CALC): 81 mg/dL (ref <130)
Triglycerides: 138 mg/dL (ref <150)

## 2019-01-10 LAB — COMPREHENSIVE METABOLIC PANEL
AG Ratio: 1.1 (calc) (ref 1.0–2.5)
ALT: 41 U/L (ref 9–46)
AST: 30 U/L (ref 10–35)
Albumin: 4.1 g/dL (ref 3.6–5.1)
Alkaline phosphatase (APISO): 85 U/L (ref 40–115)
BUN: 15 mg/dL (ref 7–25)
CO2: 24 mmol/L (ref 20–32)
Calcium: 9.4 mg/dL (ref 8.6–10.3)
Chloride: 98 mmol/L (ref 98–110)
Creat: 1.25 mg/dL (ref 0.70–1.33)
GLUCOSE: 421 mg/dL — AB (ref 65–99)
Globulin: 3.9 g/dL (calc) — ABNORMAL HIGH (ref 1.9–3.7)
Potassium: 4.3 mmol/L (ref 3.5–5.3)
Sodium: 131 mmol/L — ABNORMAL LOW (ref 135–146)
Total Bilirubin: 0.4 mg/dL (ref 0.2–1.2)
Total Protein: 8 g/dL (ref 6.1–8.1)

## 2019-01-10 LAB — HIV-1 RNA QUANT-NO REFLEX-BLD
HIV 1 RNA Quant: <20 copies/mL
HIV-1 RNA Quant, Log: <1.3 Log copies/mL

## 2019-01-10 LAB — RPR: RPR Ser Ql: REACTIVE — AB

## 2019-01-10 LAB — FLUORESCENT TREPONEMAL AB(FTA)-IGG-BLD: FLUORESCENT TREPONEMAL ABS: REACTIVE — AB

## 2019-01-12 ENCOUNTER — Telehealth: Payer: Self-pay | Admitting: *Deleted

## 2019-01-12 NOTE — Telephone Encounter (Signed)
[facility] called with urgent lab result of glucose =[phone] 01/08/2019. RN confirmed receipt, will forward to PCP who has recently changed patient's diabetic medications. Andree CossHowell, Breeley Bischof M, RN

## 2019-01-12 NOTE — Telephone Encounter (Signed)
Noted. Thanks.

## 2019-01-15 ENCOUNTER — Encounter
Admit: 2019-01-15 | Discharge: 2019-01-15 | Payer: MEDICARE | Attending: Student in an Organized Health Care Education/Training Program | Primary: Student in an Organized Health Care Education/Training Program

## 2019-01-15 ENCOUNTER — Encounter: Admit: 2019-01-15 | Discharge: 2019-01-15 | Payer: MEDICARE

## 2019-01-15 DIAGNOSIS — E1140 Type 2 diabetes mellitus with diabetic neuropathy, unspecified: Secondary | ICD-10-CM

## 2019-01-15 DIAGNOSIS — R1011 Right upper quadrant pain: Principal | ICD-10-CM

## 2019-01-15 DIAGNOSIS — I10 Essential (primary) hypertension: Secondary | ICD-10-CM

## 2019-01-15 DIAGNOSIS — F329 Major depressive disorder, single episode, unspecified: Secondary | ICD-10-CM

## 2019-01-15 MED ORDER — GABAPENTIN 100 MG CAPSULE
ORAL_CAPSULE | Freq: Every evening | ORAL | 0 refills | 0 days | Status: CP
Start: 2019-01-15 — End: 2019-04-14

## 2019-01-17 ENCOUNTER — Other Ambulatory Visit: Payer: Self-pay | Admitting: Infectious Disease

## 2019-01-17 DIAGNOSIS — B20 Human immunodeficiency virus [HIV] disease: Secondary | ICD-10-CM

## 2019-01-20 ENCOUNTER — Encounter: Admit: 2019-01-20 | Discharge: 2019-01-21 | Payer: MEDICARE

## 2019-01-20 DIAGNOSIS — R1011 Right upper quadrant pain: Principal | ICD-10-CM

## 2019-01-21 MED ORDER — LANCETS 30 GAUGE
3 refills | 0 days | Status: CP
Start: 2019-01-21 — End: ?

## 2019-01-21 MED ORDER — ALCOHOL SWABS: each | 3 refills | 0 days | Status: AC

## 2019-01-21 MED ORDER — ACCU-CHEK AVIVA PLUS TEST STRIPS
Freq: Three times a day (TID) | 3 refills | 0 days | Status: CP
Start: 2019-01-21 — End: 2019-04-21

## 2019-01-21 MED ORDER — ALCOHOL SWABS
3 refills | 0.00000 days | Status: CP
Start: 2019-01-21 — End: ?

## 2019-01-22 ENCOUNTER — Encounter: Payer: Self-pay | Admitting: Infectious Disease

## 2019-01-22 ENCOUNTER — Ambulatory Visit (INDEPENDENT_AMBULATORY_CARE_PROVIDER_SITE_OTHER): Payer: Medicare Other | Admitting: Infectious Disease

## 2019-01-22 ENCOUNTER — Other Ambulatory Visit (HOSPITAL_COMMUNITY)
Admission: RE | Admit: 2019-01-22 | Discharge: 2019-01-22 | Disposition: A | Discharge status: Home / Self Care | Payer: Medicare Other | Source: Ambulatory Visit | Attending: Infectious Disease | Admitting: Infectious Disease

## 2019-01-22 VITALS — BP 133/86 | HR 94 | Temp 97.5°F | Wt 251.0 lb

## 2019-01-22 DIAGNOSIS — B20 Human immunodeficiency virus [HIV] disease: Secondary | ICD-10-CM

## 2019-01-22 DIAGNOSIS — Z23 Encounter for immunization: Secondary | ICD-10-CM | POA: Diagnosis not present

## 2019-01-22 DIAGNOSIS — L2081 Atopic neurodermatitis: Secondary | ICD-10-CM | POA: Diagnosis not present

## 2019-01-22 DIAGNOSIS — Z86718 Personal history of other venous thrombosis and embolism: Secondary | ICD-10-CM | POA: Insufficient documentation

## 2019-01-22 DIAGNOSIS — E785 Hyperlipidemia, unspecified: Secondary | ICD-10-CM | POA: Diagnosis not present

## 2019-01-22 DIAGNOSIS — Z113 Encounter for screening for infections with a predominantly sexual mode of transmission: Secondary | ICD-10-CM

## 2019-01-22 NOTE — Progress Notes (Signed)
Chief complaint: For HIV on medications Subjective:    Patient ID: Craig [name], male    DOB: January 24, 1961, 57 y.o.   MRN: [id number]  HPI  58 year old African American male with HIV, multiple medical problems including eczema (on MTX), DVT with PE, dCHF, HTN, ? MI, prior AKI previously cared for  at [facility]  I was able in fact to read my note on him from when I was his HIV provider at [facility] in 2006. He had been on a regimen of videx, Emtriva, and Reyataz 400 mg and was perfectly suppressed then was changed over to Reyataz, Norvir and truvada with apparent perfect virological suppression then change to n PRezista, Norvir and Truvada.   His  ARV regimen changed by Dr. Cardell [name] who saw him and changed him to Tivicay and Truvada to avoid Norvir interaction with advair that he was being rx and to simplify his regimen.   He has maintained perfect virological suppression since then and we changed him in fact to [facility] and DESCOVY --> BIKTARVY  He is currently being treated by dermatology at [facility] including Dr. Dreama [name] and Dr. Sandrea [name] and they are on templating treating him with dupixent since his atopic dermatitis has been so difficult to manage even with MTX.  Craig [name] recently had some pain in his midsection and had an ultrasound done with Dopplers to look for DVT in his portal venous system which was negative.  He is also going to have an upper endoscopy due to some abdominal pain to look for possible peptic ulcer disease.  He did have syphilis which we treated several months ago but has not been tested for gonorrhea chlamydia which we will check in urine and extra genital sites as well.        Past Medical History:  Diagnosis Date  . AIDS (HCC) 05/24/2015  . Allergic rhinitis 01/25/2016  . Atopic dermatitis 12/30/2017  . 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 Deep vein thrombosis (HCC) 05/09/2011    Overview:  Jan 2012   . Diabetes mellitus, controlled (HCC) 01/25/2016  . Dysplasia of anus   . Esophageal reflux   . 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 Hernia, inguinal 04/09/2013  . HIV (human immunodeficiency virus infection) (HCC)   . Horseshoe tear of retina without detachment 02-13-12  . 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  . Molluscum contagiosum 09-29-2012  . 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 infarction   . Other convulsions   . PE (pulmonary embolism) 04/09/2013   Overview:  Jan 2012   . Personal history of venous thrombosis and embolism   . Secondary iridocyclitis, infectious   . Septic arthritis of wrist (HCC)    rt  . Shortness of breath dyspnea   . Unspecified hereditary and idiopathic peripheral neuropathy   . 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 Viral URI 03/10/2018    Past Surgical History:  Procedure Laterality Date  . COLONOSCOPY    . I&D EXTREMITY Right 07/28/2015   Procedure: IRRIGATION AND DEBRIDEMENT WRIST;  Surgeon: Craig Neu, MD;  Location: [facility] OR;  Service: Plastics;  Laterality: Right;    No family history on file.    Social History   Socioeconomic History  . Marital status: Single    Spouse name: Not on file  . Number of children: Not on file  . Years of education: Not on file  . Highest education level: Not on file  Occupational History  . Not on file  Social Needs  . Financial resource strain: Not on file  . Food insecurity:    Worry: Not on file    Inability: Not on file  . Transportation needs:    Medical: Not on file    Non-medical: Not on file  Tobacco Use  . Smoking status: Never Smoker  . Smokeless tobacco: Never Used  Substance and Sexual Activity  . Alcohol use: No    Alcohol/week: 0.0 standard drinks  . Drug use: No  . Sexual activity: Not Currently    Partners: Male    Birth control/protection: Condom  Lifestyle  . Physical activity:    Days per week: Not on file    Minutes per session: Not on file  . Stress: Not on file    Relationships  . Social connections:    Talks on phone: Not on file    Gets together: Not on file    Attends religious service: Not on file    Active member of club or organization: Not on file    Attends meetings of clubs or organizations: Not on file    Relationship status: Not on file  Other Topics Concern  . Not on file  Social History Narrative  . Not on file    Allergies  Allergen Reactions  . Penicillins Hives and Rash    Has patient had a PCN reaction causing immediate rash, facial/tongue/throat swelling, SOB or lightheadedness with hypotension:YES Has patient had a PCN reaction causing Has patient had a PCN reaction that required hospitalization NO Has patient had a PCN reaction occurring within the last 10 years: YES If all of the above answers are "NO", then may proceed with Cephalosporin use.  . Shellfish Allergy Shortness Of Breath  . Latex Rash     Current Outpatient Medications:  .  Alcohol Swabs (ALCOHOL PREP) 70 % PADS, , Disp: , Rfl:  .  ASSURE COMFORT LANCETS 30G MISC, , Disp: , Rfl:  .  atorvastatin (LIPITOR) 40 MG tablet, Take 40 mg by mouth daily., Disp: , Rfl:  .  BIKTARVY 50-200-25 MG TABS tablet, TAKE 1 TABLET BY MOUTH DAILY, Disp: 30 tablet, Rfl: 0 .  citalopram (CELEXA) 40 MG tablet, Take 40 mg by mouth daily., Disp: , Rfl:  .  colchicine 0.6 MG tablet, Take 1 tablet (0.6 mg total) by mouth daily., Disp: 2 tablet, Rfl: 0 .  fluticasone (FLONASE) 50 MCG/ACT nasal spray, Place 1 spray into both nostrils daily., Disp: 9.9 g, Rfl: 2 .  glipiZIDE (GLUCOTROL XL) 2.5 MG 24 hr tablet, Take 2.5 mg by mouth., Disp: , Rfl:  .  hydrOXYzine (ATARAX/VISTARIL) 25 MG tablet, Take 25 mg by mouth every 6 (six) hours as needed for anxiety. , Disp: , Rfl:  .  lisinopril (PRINIVIL,ZESTRIL) 2.5 MG tablet, Take 2.5 mg by mouth daily., Disp: , Rfl:  .  metFORMIN (GLUCOPHAGE) 500 MG tablet, Take 1 tablet (500 mg total) by mouth 2 (two) times daily with a meal., Disp: 60  tablet, Rfl: 1 .  warfarin (COUMADIN) 5 MG tablet, Take 5-7.5 mg by mouth daily. Take 1.5 (7.5mg ) daily except Sunday take 1 tab, Disp: , Rfl:  .  ACCU-CHEK AVIVA PLUS test strip, , Disp: , Rfl:  .  albuterol (PROVENTIL HFA;VENTOLIN HFA) 108 (90 BASE) MCG/ACT inhaler, Inhale 2 puffs into the lungs every 6 (six) hours as needed for wheezing or shortness of breath., Disp: , Rfl:  .  Dextromethorphan-Guaifenesin (ROBITUSSIN DM PO), Take by mouth., Disp: , Rfl:  .  folic acid (FOLVITE) 1 MG tablet, Take 4 mg by mouth See admin instructions. Take everyday except on Friday., Disp: , Rfl:  .  HYDROcodone-homatropine (HYCODAN) 5-1.5 MG/5ML syrup, Take 5 mLs by mouth every 6 (six) hours as needed for cough., Disp: 60 mL, Rfl: 0 .  metFORMIN (GLUCOPHAGE-XR) 500 MG 24 hr tablet, TK 2 TS PO Q MORNING BEFORE BREAKFAST, Disp: , Rfl: 3 .  methotrexate (RHEUMATREX) 2.5 MG tablet, Take 8 tablets once a week on Friday., Disp: , Rfl: 1 .  ondansetron (ZOFRAN) 4 MG tablet, Take 1 tablet (4 mg total) by mouth every 8 (eight) hours as needed for nausea or vomiting., Disp: 60 tablet, Rfl: 0 .  Phenylephrine-Guaifenesin (MUCUS RELIEF D PO), Take by mouth., Disp: , Rfl:  .  terbinafine (LAMISIL) 250 MG tablet, , Disp: , Rfl:      Review of Systems  Constitutional: Negative for activity change, appetite change, chills, diaphoresis, fatigue, fever and unexpected weight change.  HENT: Negative for congestion, rhinorrhea, sinus pressure, sneezing, sore throat and trouble swallowing.   Eyes: Negative for photophobia and visual disturbance.  Respiratory: Negative for cough, chest tightness, shortness of breath, wheezing and stridor.   Cardiovascular: Negative for chest pain, palpitations and leg swelling.  Gastrointestinal: Positive for abdominal pain. Negative for abdominal distention, anal bleeding, blood in stool, constipation, diarrhea, nausea and vomiting.  Genitourinary: Negative for difficulty urinating, dysuria,  flank pain and hematuria.  Musculoskeletal: Negative for arthralgias, back pain, gait problem and joint swelling.  Skin: Negative for color change, pallor, rash and wound.  Neurological: Negative for dizziness, tremors, weakness and light-headedness.  Hematological: Negative for adenopathy. Does not bruise/bleed easily.  Psychiatric/Behavioral: Negative for agitation, behavioral problems, confusion, decreased concentration, dysphoric mood and sleep disturbance.       Objective:   Physical Exam  Constitutional: He is oriented to person, place, and time. He appears well-developed and well-nourished. No distress.  HENT:  Head: Normocephalic and atraumatic.  Mouth/Throat: No oropharyngeal exudate or posterior oropharyngeal edema.  Eyes: Conjunctivae and EOM are normal. No scleral icterus.  Neck: Normal range of motion. Neck supple. No JVD present.  Cardiovascular: Normal rate and regular rhythm.  Pulmonary/Chest: Effort normal and breath sounds normal. No respiratory distress.  Abdominal: Soft. He exhibits no distension. There is no guarding.  Musculoskeletal:        General: Edema present. No tenderness.  Lymphadenopathy:    He has no cervical adenopathy.  Neurological: He is alert and oriented to person, place, and time. He exhibits normal muscle tone. Coordination normal.  Skin: Skin is warm and dry. He is not diaphoretic. No erythema. No pallor.  Psychiatric: He has a normal mood and affect. His behavior is normal. Judgment and thought content normal.  Nursing note and vitals reviewed.         Assessment & Plan:  #1: HIV: Well controlled on BIKTARVY can see me back in 1 years time #2 Syphilis: REACTIVE (01/16 1426) as post treat with penicillin and titers on the way down check for gonorrhea chlamydia given risk factors that he has for these co-infections  Refer HIV negative partners for preexposure prophylaxis   #3.diabetes mellitus on metformin  #4 deep venous thrombosis  on warfarin I assume he has a lifelong need for anticoagulation as he has been on anticoagulation for almost 8 years  #5 Atopic dermatitis: Being followed at [facility]   I spent greater than 25 minutes with the patient including greater than 50% of time  in face to face counsel of the patient in his HIV, need to screen for STIs review of his other medical conditions and including [facility] via "care everywhere "and in coordination of his care.

## 2019-01-23 LAB — CYTOLOGY, (ORAL, ANAL, URETHRAL) ANCILLARY ONLY
CHLAMYDIA, DNA PROBE: NEGATIVE
Chlamydia: NEGATIVE
NEISSERIA GONORRHEA: NEGATIVE
Neisseria Gonorrhea: NEGATIVE

## 2019-01-23 LAB — URINE CYTOLOGY ANCILLARY ONLY
Chlamydia: NEGATIVE
Neisseria Gonorrhea: NEGATIVE

## 2019-01-28 ENCOUNTER — Encounter: Payer: Self-pay | Admitting: Infectious Disease

## 2019-01-29 ENCOUNTER — Encounter: Admit: 2019-01-29 | Discharge: 2019-01-30 | Payer: MEDICARE

## 2019-01-29 DIAGNOSIS — H209 Unspecified iridocyclitis: Secondary | ICD-10-CM

## 2019-01-29 DIAGNOSIS — F329 Major depressive disorder, single episode, unspecified: Secondary | ICD-10-CM

## 2019-01-29 DIAGNOSIS — I2699 Other pulmonary embolism without acute cor pulmonale: Secondary | ICD-10-CM

## 2019-01-29 DIAGNOSIS — E1140 Type 2 diabetes mellitus with diabetic neuropathy, unspecified: Principal | ICD-10-CM

## 2019-01-29 MED ORDER — WARFARIN 5 MG TABLET
ORAL_TABLET | prn refills | 0 days
Start: 2019-01-29 — End: 2019-03-03

## 2019-01-29 MED ORDER — SERTRALINE 50 MG TABLET
ORAL_TABLET | Freq: Every day | ORAL | 3 refills | 0.00000 days | Status: CP
Start: 2019-01-29 — End: 2020-01-29

## 2019-02-10 ENCOUNTER — Encounter
Admit: 2019-02-10 | Discharge: 2019-02-11 | Payer: MEDICARE | Attending: Nurse Practitioner | Primary: Nurse Practitioner

## 2019-02-10 DIAGNOSIS — R1310 Dysphagia, unspecified: Principal | ICD-10-CM

## 2019-02-19 ENCOUNTER — Other Ambulatory Visit: Payer: Self-pay | Admitting: Infectious Disease

## 2019-02-19 DIAGNOSIS — B20 Human immunodeficiency virus [HIV] disease: Secondary | ICD-10-CM

## 2019-03-03 ENCOUNTER — Encounter: Admit: 2019-03-03 | Discharge: 2019-03-04 | Payer: MEDICARE

## 2019-03-03 DIAGNOSIS — I2699 Other pulmonary embolism without acute cor pulmonale: Principal | ICD-10-CM

## 2019-03-03 DIAGNOSIS — E1140 Type 2 diabetes mellitus with diabetic neuropathy, unspecified: Principal | ICD-10-CM

## 2019-03-03 MED ORDER — WARFARIN 5 MG TABLET
ORAL_TABLET | prn refills | 0 days | Status: CP
Start: 2019-03-03 — End: 2019-07-08

## 2019-03-11 DIAGNOSIS — L209 Atopic dermatitis, unspecified: Principal | ICD-10-CM

## 2019-03-12 NOTE — Progress Notes (Signed)
error 

## 2019-03-17 MED ORDER — TRIAMCINOLONE ACETONIDE 0.1 % TOPICAL OINTMENT
6 refills | 0 days | Status: CP
Start: 2019-03-17 — End: ?

## 2019-03-25 ENCOUNTER — Encounter: Admit: 2019-03-25 | Discharge: 2019-03-26 | Payer: MEDICARE

## 2019-03-25 DIAGNOSIS — I2699 Other pulmonary embolism without acute cor pulmonale: Principal | ICD-10-CM

## 2019-03-31 DIAGNOSIS — R1310 Dysphagia, unspecified: Principal | ICD-10-CM

## 2019-04-01 ENCOUNTER — Encounter
Admit: 2019-04-01 | Discharge: 2019-04-01 | Payer: MEDICARE | Attending: Nurse Practitioner | Primary: Nurse Practitioner

## 2019-04-01 ENCOUNTER — Encounter: Admit: 2019-04-01 | Discharge: 2019-04-01 | Payer: MEDICARE

## 2019-04-01 DIAGNOSIS — R1310 Dysphagia, unspecified: Principal | ICD-10-CM

## 2019-04-15 MED ORDER — GABAPENTIN 100 MG CAPSULE
ORAL_CAPSULE | Freq: Every evening | ORAL | 0 refills | 0.00000 days | Status: CP
Start: 2019-04-15 — End: ?

## 2019-04-22 ENCOUNTER — Encounter: Admit: 2019-04-22 | Discharge: 2019-04-23 | Payer: MEDICARE

## 2019-04-22 DIAGNOSIS — I2699 Other pulmonary embolism without acute cor pulmonale: Principal | ICD-10-CM

## 2019-04-22 DIAGNOSIS — E1140 Type 2 diabetes mellitus with diabetic neuropathy, unspecified: Secondary | ICD-10-CM

## 2019-04-22 MED ORDER — GLIPIZIDE ER 5 MG TABLET, EXTENDED RELEASE 24 HR
ORAL_TABLET | Freq: Every day | ORAL | 3 refills | 0 days | Status: CP
Start: 2019-04-22 — End: 2019-08-13

## 2019-05-13 ENCOUNTER — Ambulatory Visit: Admit: 2019-05-13 | Discharge: 2019-05-14 | Payer: MEDICARE

## 2019-05-13 ENCOUNTER — Encounter
Admit: 2019-05-13 | Discharge: 2019-05-14 | Payer: MEDICARE | Attending: Nurse Practitioner | Primary: Nurse Practitioner

## 2019-05-22 MED ORDER — DUPILUMAB 300 MG/2 ML SUBCUTANEOUS SYRINGE
SUBCUTANEOUS | 2 refills | 0.00000 days | Status: CP
Start: 2019-05-22 — End: 2019-06-21

## 2019-06-12 ENCOUNTER — Other Ambulatory Visit: Payer: Self-pay

## 2019-06-12 DIAGNOSIS — Z20822 Contact with and (suspected) exposure to covid-19: Secondary | ICD-10-CM

## 2019-06-14 LAB — NOVEL CORONAVIRUS, NAA: SARS-CoV-2, NAA: NOT DETECTED

## 2019-06-15 ENCOUNTER — Encounter: Admit: 2019-06-15 | Discharge: 2019-06-16 | Payer: MEDICARE | Attending: Pharmacist | Primary: Pharmacist

## 2019-06-15 DIAGNOSIS — E119 Type 2 diabetes mellitus without complications: Secondary | ICD-10-CM

## 2019-06-15 DIAGNOSIS — I2699 Other pulmonary embolism without acute cor pulmonale: Principal | ICD-10-CM

## 2019-06-15 DIAGNOSIS — E669 Obesity, unspecified: Secondary | ICD-10-CM

## 2019-07-06 MED ORDER — METFORMIN ER 500 MG TABLET,EXTENDED RELEASE 24 HR
ORAL_TABLET | Freq: Two times a day (BID) | ORAL | 2 refills | 30 days | Status: CP
Start: 2019-07-06 — End: 2019-10-04

## 2019-07-08 ENCOUNTER — Encounter: Admit: 2019-07-08 | Discharge: 2019-07-08 | Payer: MEDICARE

## 2019-07-08 ENCOUNTER — Encounter
Admit: 2019-07-08 | Discharge: 2019-07-08 | Payer: MEDICARE | Attending: Student in an Organized Health Care Education/Training Program | Primary: Student in an Organized Health Care Education/Training Program

## 2019-07-08 DIAGNOSIS — Z1159 Encounter for screening for other viral diseases: Secondary | ICD-10-CM

## 2019-07-08 DIAGNOSIS — I2699 Other pulmonary embolism without acute cor pulmonale: Principal | ICD-10-CM

## 2019-07-08 DIAGNOSIS — R1011 Right upper quadrant pain: Principal | ICD-10-CM

## 2019-07-08 DIAGNOSIS — R109 Unspecified abdominal pain: Principal | ICD-10-CM

## 2019-07-08 MED ORDER — WARFARIN 5 MG TABLET
ORAL_TABLET | prn refills | 0 days
Start: 2019-07-08 — End: ?

## 2019-07-09 DIAGNOSIS — E669 Obesity, unspecified: Secondary | ICD-10-CM

## 2019-07-09 DIAGNOSIS — E119 Type 2 diabetes mellitus without complications: Principal | ICD-10-CM

## 2019-07-10 ENCOUNTER — Encounter: Admit: 2019-07-10 | Discharge: 2019-07-10 | Payer: MEDICARE | Attending: Registered" | Primary: Registered"

## 2019-07-23 ENCOUNTER — Encounter: Admit: 2019-07-23 | Discharge: 2019-07-24 | Payer: MEDICARE

## 2019-07-23 DIAGNOSIS — R1011 Right upper quadrant pain: Principal | ICD-10-CM

## 2019-07-23 DIAGNOSIS — I2699 Other pulmonary embolism without acute cor pulmonale: Secondary | ICD-10-CM

## 2019-07-28 ENCOUNTER — Ambulatory Visit: Admit: 2019-07-28 | Discharge: 2019-07-29 | Payer: MEDICARE

## 2019-07-28 DIAGNOSIS — R1011 Right upper quadrant pain: Principal | ICD-10-CM

## 2019-08-13 ENCOUNTER — Encounter: Admit: 2019-08-13 | Discharge: 2019-08-14 | Payer: MEDICARE

## 2019-08-13 DIAGNOSIS — E1140 Type 2 diabetes mellitus with diabetic neuropathy, unspecified: Secondary | ICD-10-CM

## 2019-08-13 DIAGNOSIS — I2699 Other pulmonary embolism without acute cor pulmonale: Principal | ICD-10-CM

## 2019-08-13 MED ORDER — LISINOPRIL 2.5 MG TABLET
ORAL_TABLET | Freq: Every day | ORAL | 3 refills | 90.00000 days
Start: 2019-08-13 — End: ?

## 2019-08-13 MED ORDER — GLIPIZIDE ER 10 MG TABLET, EXTENDED RELEASE 24 HR
ORAL_TABLET | Freq: Every day | ORAL | 3 refills | 45 days | Status: CP
Start: 2019-08-13 — End: 2020-02-09

## 2019-09-02 ENCOUNTER — Ambulatory Visit
Admit: 2019-09-02 | Discharge: 2019-09-03 | Payer: MEDICARE | Attending: Student in an Organized Health Care Education/Training Program | Primary: Student in an Organized Health Care Education/Training Program

## 2019-09-02 ENCOUNTER — Ambulatory Visit: Admit: 2019-09-02 | Discharge: 2019-09-03 | Payer: MEDICARE

## 2019-09-02 DIAGNOSIS — E1165 Type 2 diabetes mellitus with hyperglycemia: Secondary | ICD-10-CM

## 2019-09-02 DIAGNOSIS — I2699 Other pulmonary embolism without acute cor pulmonale: Secondary | ICD-10-CM

## 2019-09-02 DIAGNOSIS — E1365 Other specified diabetes mellitus with hyperglycemia: Secondary | ICD-10-CM

## 2019-09-02 MED ORDER — FREESTYLE LITE STRIPS
ORAL_STRIP | Freq: Four times a day (QID) | 11 refills | 0.00000 days | Status: CP
Start: 2019-09-02 — End: 2019-10-02

## 2019-09-02 MED ORDER — BLOOD-GLUCOSE METER KIT WRAPPER
0 refills | 0 days | Status: CP
Start: 2019-09-02 — End: 2020-09-01

## 2019-09-02 MED ORDER — INSULIN LISPRO (U-100) 100 UNIT/ML SUBCUTANEOUS PEN
Freq: Three times a day (TID) | SUBCUTANEOUS | 11 refills | 30.00000 days | Status: CP
Start: 2019-09-02 — End: 2020-09-01

## 2019-09-02 MED ORDER — LANTUS SOLOSTAR U-100 INSULIN 100 UNIT/ML (3 ML) SUBCUTANEOUS PEN
PEN_INJECTOR | Freq: Every day | SUBCUTANEOUS | 11 refills | 0 days | Status: CP
Start: 2019-09-02 — End: 2020-09-01

## 2019-09-02 MED ORDER — LANCETS
Freq: Four times a day (QID) | 11 refills | 0 days | Status: CP
Start: 2019-09-02 — End: 2019-10-02

## 2019-09-04 ENCOUNTER — Other Ambulatory Visit: Payer: Self-pay | Admitting: Infectious Disease

## 2019-09-04 DIAGNOSIS — B20 Human immunodeficiency virus [HIV] disease: Secondary | ICD-10-CM

## 2019-09-07 ENCOUNTER — Encounter: Admit: 2019-09-07 | Discharge: 2019-09-08 | Payer: MEDICARE

## 2019-09-07 DIAGNOSIS — I10 Essential (primary) hypertension: Secondary | ICD-10-CM

## 2019-09-07 DIAGNOSIS — F329 Major depressive disorder, single episode, unspecified: Secondary | ICD-10-CM

## 2019-09-07 DIAGNOSIS — E669 Obesity, unspecified: Secondary | ICD-10-CM

## 2019-09-07 DIAGNOSIS — E1140 Type 2 diabetes mellitus with diabetic neuropathy, unspecified: Secondary | ICD-10-CM

## 2019-09-07 MED ORDER — METFORMIN ER 500 MG TABLET,EXTENDED RELEASE 24 HR
ORAL_TABLET | Freq: Two times a day (BID) | ORAL | 2 refills | 30.00000 days | Status: CP
Start: 2019-09-07 — End: 2019-12-06

## 2019-09-18 DIAGNOSIS — L209 Atopic dermatitis, unspecified: Secondary | ICD-10-CM

## 2019-09-18 DIAGNOSIS — L2084 Intrinsic (allergic) eczema: Secondary | ICD-10-CM

## 2019-09-18 MED ORDER — DUPILUMAB 300 MG/2 ML SUBCUTANEOUS SYRINGE
INJECTION | SUBCUTANEOUS | 6 refills | 0.00000 days | Status: CP
Start: 2019-09-18 — End: ?

## 2019-09-22 DIAGNOSIS — I2510 Atherosclerotic heart disease of native coronary artery without angina pectoris: Secondary | ICD-10-CM

## 2019-09-22 MED ORDER — ATORVASTATIN 40 MG TABLET
ORAL_TABLET | Freq: Every day | ORAL | 3 refills | 90 days | Status: CP
Start: 2019-09-22 — End: 2020-09-21

## 2019-10-08 ENCOUNTER — Encounter: Admit: 2019-10-08 | Discharge: 2019-10-08 | Payer: MEDICARE

## 2019-10-08 DIAGNOSIS — I2699 Other pulmonary embolism without acute cor pulmonale: Principal | ICD-10-CM

## 2019-10-08 DIAGNOSIS — E1365 Other specified diabetes mellitus with hyperglycemia: Principal | ICD-10-CM

## 2019-10-08 MED ORDER — PEN NEEDLE, DIABETIC 32 GAUGE X 5/32" (4 MM): 100 | each | Freq: Every day | 0 refills | 1 days | Status: AC

## 2019-10-08 MED ORDER — INSULIN LISPRO (U-100) 100 UNIT/ML SUBCUTANEOUS PEN: 7 [IU] | mL | Freq: Three times a day (TID) | 0 refills | 72 days | Status: AC

## 2019-10-08 MED ORDER — LANTUS SOLOSTAR U-100 INSULIN 100 UNIT/ML (3 ML) SUBCUTANEOUS PEN: 18 [IU] | pen | Freq: Every day | 0 refills | 0 days | Status: AC

## 2019-10-29 ENCOUNTER — Encounter: Admit: 2019-10-29 | Discharge: 2019-10-30 | Payer: MEDICARE

## 2019-10-29 DIAGNOSIS — I2699 Other pulmonary embolism without acute cor pulmonale: Principal | ICD-10-CM

## 2019-10-29 DIAGNOSIS — E1165 Type 2 diabetes mellitus with hyperglycemia: Principal | ICD-10-CM

## 2019-10-29 MED ORDER — INSULIN LISPRO (U-100) 100 UNIT/ML SUBCUTANEOUS PEN
Freq: Three times a day (TID) | SUBCUTANEOUS | prn refills | 50.00000 days | Status: CP
Start: 2019-10-29 — End: 2020-10-28

## 2019-10-29 MED ORDER — LANTUS SOLOSTAR U-100 INSULIN 100 UNIT/ML (3 ML) SUBCUTANEOUS PEN
PEN_INJECTOR | Freq: Every evening | SUBCUTANEOUS | prn refills | 0.00000 days | Status: CP
Start: 2019-10-29 — End: 2020-10-28

## 2019-12-08 DIAGNOSIS — L209 Atopic dermatitis, unspecified: Principal | ICD-10-CM

## 2019-12-08 MED ORDER — TRIAMCINOLONE ACETONIDE 0.1 % TOPICAL OINTMENT
6 refills | 0 days | Status: CP
Start: 2019-12-08 — End: ?

## 2019-12-15 ENCOUNTER — Encounter: Admit: 2019-12-15 | Discharge: 2019-12-16 | Payer: MEDICARE

## 2019-12-15 DIAGNOSIS — I2699 Other pulmonary embolism without acute cor pulmonale: Principal | ICD-10-CM

## 2019-12-15 DIAGNOSIS — E1165 Type 2 diabetes mellitus with hyperglycemia: Principal | ICD-10-CM

## 2019-12-16 MED ORDER — METFORMIN ER 500 MG TABLET,EXTENDED RELEASE 24 HR
ORAL_TABLET | Freq: Two times a day (BID) | ORAL | 2 refills | 30 days | Status: CP
Start: 2019-12-16 — End: 2020-03-15

## 2019-12-17 MED ORDER — OMEPRAZOLE 40 MG CAPSULE,DELAYED RELEASE
ORAL_CAPSULE | Freq: Every day | ORAL | 1 refills | 90 days | Status: CP
Start: 2019-12-17 — End: ?

## 2020-01-06 ENCOUNTER — Encounter: Admit: 2020-01-06 | Discharge: 2020-01-07 | Payer: MEDICARE

## 2020-01-06 DIAGNOSIS — L209 Atopic dermatitis, unspecified: Principal | ICD-10-CM

## 2020-01-06 DIAGNOSIS — L2084 Intrinsic (allergic) eczema: Principal | ICD-10-CM

## 2020-01-06 MED ORDER — CLOBETASOL 0.05 % TOPICAL OINTMENT
Freq: Two times a day (BID) | TOPICAL | 1 refills | 0.00000 days | Status: CP
Start: 2020-01-06 — End: 2021-01-05

## 2020-01-06 MED ORDER — TRIAMCINOLONE ACETONIDE 0.1 % TOPICAL OINTMENT
INTRAMUSCULAR | 6 refills | 0.00000 days | Status: CP
Start: 2020-01-06 — End: ?

## 2020-01-06 MED ORDER — ALCLOMETASONE 0.05 % TOPICAL OINTMENT
Freq: Two times a day (BID) | TOPICAL | 3 refills | 0.00000 days | Status: CP
Start: 2020-01-06 — End: 2021-01-05

## 2020-01-06 MED ORDER — DUPILUMAB 300 MG/2 ML SUBCUTANEOUS SYRINGE
INJECTION | SUBCUTANEOUS | 6 refills | 0.00000 days | Status: CP
Start: 2020-01-06 — End: ?

## 2020-01-18 ENCOUNTER — Emergency Department (HOSPITAL_COMMUNITY)
Admission: EM | Admit: 2020-01-18 | Discharge: 2020-01-18 | Disposition: A | Discharge status: Home / Self Care | Payer: Medicare Other | Attending: Emergency Medicine | Admitting: Emergency Medicine

## 2020-01-18 ENCOUNTER — Encounter (HOSPITAL_COMMUNITY): Payer: Self-pay | Admitting: Student

## 2020-01-18 ENCOUNTER — Emergency Department (HOSPITAL_COMMUNITY): Payer: Medicare Other

## 2020-01-18 ENCOUNTER — Other Ambulatory Visit: Payer: Self-pay

## 2020-01-18 DIAGNOSIS — I110 Hypertensive heart disease with heart failure: Secondary | ICD-10-CM | POA: Diagnosis not present

## 2020-01-18 DIAGNOSIS — Z79899 Other long term (current) drug therapy: Secondary | ICD-10-CM | POA: Diagnosis not present

## 2020-01-18 DIAGNOSIS — J45909 Unspecified asthma, uncomplicated: Secondary | ICD-10-CM | POA: Diagnosis not present

## 2020-01-18 DIAGNOSIS — B20 Human immunodeficiency virus [HIV] disease: Secondary | ICD-10-CM | POA: Insufficient documentation

## 2020-01-18 DIAGNOSIS — E119 Type 2 diabetes mellitus without complications: Secondary | ICD-10-CM | POA: Insufficient documentation

## 2020-01-18 DIAGNOSIS — Z91040 Latex allergy status: Secondary | ICD-10-CM | POA: Insufficient documentation

## 2020-01-18 DIAGNOSIS — Z7984 Long term (current) use of oral hypoglycemic drugs: Secondary | ICD-10-CM | POA: Insufficient documentation

## 2020-01-18 DIAGNOSIS — M25561 Pain in right knee: Secondary | ICD-10-CM | POA: Diagnosis present

## 2020-01-18 DIAGNOSIS — Z7901 Long term (current) use of anticoagulants: Secondary | ICD-10-CM | POA: Diagnosis not present

## 2020-01-18 DIAGNOSIS — I5032 Chronic diastolic (congestive) heart failure: Secondary | ICD-10-CM | POA: Diagnosis not present

## 2020-01-18 DIAGNOSIS — L03115 Cellulitis of right lower limb: Secondary | ICD-10-CM | POA: Diagnosis not present

## 2020-01-18 MED ORDER — DOXYCYCLINE HYCLATE 100 MG PO CAPS: 100.0000 mg | ORAL_CAPSULE | Freq: Two times a day (BID) | ORAL | 0 refills | Status: DC

## 2020-01-18 MED ORDER — HYDROCODONE-ACETAMINOPHEN 5-325 MG PO TABS: 1.0000 | ORAL_TABLET | Freq: Four times a day (QID) | ORAL | 0 refills | Status: DC | PRN

## 2020-01-18 NOTE — ED Triage Notes (Signed)
Pt reports right anterior knee pain for the last week. Hit leg on the couch. Also has hx of gout. Has not tried OTC meds. Pt ambulatory. VSS.

## 2020-01-18 NOTE — ED Provider Notes (Signed)
[facility] EMERGENCY DEPARTMENT Provider Note   CSN: [id number] Arrival date & time: 01/18/20  1349     History Chief Complaint  Patient presents with  . Knee Pain    Craig [name] is a 59 y.o. male with a history of diabetes mellitus, HIV (last CD4 count 640, undetectable viral load),  heart failure, hypertension, CAD, prior VTE on Coumadin and gout who presents to the emergency department with complaints of right knee pain over the past 1 week.  Patient states pain is constant, worse with movement, worse with weightbearing, no alleviating factors.  No intervention prior to arrival.  He states the front of the knee is where it hurts.  He states he is also having some warmth and redness to the front of his knee.  He states symptoms are somewhat similar to prior gout, he states his gout previously was "all over".  He has his baseline peripheral neuropathy which is unchanged.  Denies fever, chills, weakness, wounds, or injuries.  He has been taking his antiretroviral medicines as well as his Coumadin as prescribed.  HPI     Past Medical History:  Diagnosis Date  . AIDS (HCC) 05/24/2015  . Allergic rhinitis 01/25/2016  . Atopic dermatitis 12/30/2017  . 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 Deep vein thrombosis (HCC) 05/09/2011   Overview:  Jan 2012   . Diabetes mellitus, controlled (HCC) 01/25/2016  . Dysplasia of anus   . Esophageal reflux   . 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 Hernia, inguinal 04/09/2013  . HIV (human immunodeficiency virus infection) (HCC)   . Horseshoe tear of retina without detachment 02-13-12  . 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  . Molluscum contagiosum 09-29-2012  . 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 infarction   . Other convulsions   . PE (pulmonary embolism) 04/09/2013   Overview:  Jan 2012   . Personal history of venous thrombosis and embolism   . Secondary  iridocyclitis, infectious   . Septic arthritis of wrist (HCC)    rt  . Shortness of breath dyspnea   . Unspecified hereditary and idiopathic peripheral neuropathy   . 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 Viral URI 03/10/2018    Patient Active Problem List   Diagnosis Date Noted  . Herpes zoster without complication 07/22/2018  . Viral URI 03/10/2018  . Atopic dermatitis 12/30/2017  . Obesity (BMI 30.0-34.9) 08/22/2017  . Diastasis recti 01/24/2017  . Diabetes mellitus, controlled (HCC) 01/25/2016  . Allergic rhinitis 01/25/2016  . Elevated INR   . Gout of right knee 07/28/2015  . HIV disease (HCC) 05/24/2015  . AIDS (HCC) 05/24/2015  . Iatrogenic hypotension 11/22/2014  . HIV infection (HCC) 03/08/2014  . History of pulmonary embolus (PE) 03/08/2014  . History of DVT (deep vein thrombosis) 03/08/2014  . H/O deep venous thrombosis 03/08/2014  . 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 H/O thrombosis 03/08/2014  . Long term current use of anticoagulant therapy 02/26/2014  . Coronary atherosclerosis of unspecified type of vessel, native or graft 02/26/2014  . Dermatitis 02/26/2014  . Depressive disorder, not elsewhere classified 02/26/2014  . Asthma, chronic 02/26/2014  . Coronary atherosclerosis 02/26/2014  . Clinical depression 02/26/2014  . Chronic diastolic heart failure (HCC) 04/09/2013  . Esophageal reflux 04/09/2013  . Essential hypertension 04/09/2013  . Gout 04/09/2013  . 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 Chronic pulmonary heart disease (HCC) 04/09/2013  . Congestive heart failure (HCC) 04/09/2013  . Convulsions (HCC)  04/09/2013  . Diastolic heart failure (HCC) 04/09/2013  . History of anticoagulant therapy 04/09/2013  . Pulmonary embolism (HCC) 04/09/2013  . Allergy to latex 04/09/2013  . Inguinal hernia 04/09/2013  . 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 Dysplasia of anus 12/29/2012  . Molluscum contagiosum 09/29/2012  . Cytomegalovirus infection  (HCC) 02/13/2012  . Horseshoe tear of retina 02/13/2012  . Deep vein thrombosis (DVT) (HCC) 05/09/2011  . Airway hyperreactivity 09/08/2010  . Hereditary and idiopathic peripheral neuropathy 09/08/2010  . Human immunodeficiency virus (HIV) disease (HCC) 12/14/2009    Past Surgical History:  Procedure Laterality Date  . COLONOSCOPY    . I & D EXTREMITY Right 07/28/2015   Procedure: IRRIGATION AND DEBRIDEMENT WRIST;  Surgeon: Knute Neu, MD;  Location: [facility] OR;  Service: Plastics;  Laterality: Right;       No family history on file.  Social History   Tobacco Use  . Smoking status: Never Smoker  . Smokeless tobacco: Never Used  Substance Use Topics  . Alcohol use: No    Alcohol/week: 0.0 standard drinks  . Drug use: No    Home Medications Prior to Admission medications   Medication Sig Start Date End Date Taking? Authorizing Provider  ACCU-CHEK AVIVA PLUS test strip  07/16/17   [provider]  albuterol (PROVENTIL HFA;VENTOLIN HFA) 108 (90 BASE) MCG/ACT inhaler Inhale 2 puffs into the lungs every 6 (six) hours as needed for wheezing or shortness of breath.    [provider]  Alcohol Swabs (ALCOHOL PREP) 70 % PADS  07/16/17   [provider]  ASSURE COMFORT LANCETS 30G MISC  07/16/17   [provider]  atorvastatin (LIPITOR) 40 MG tablet Take 40 mg by mouth daily.    [provider]  BIKTARVY 50-200-25 MG TABS tablet TAKE 1 TABLET BY MOUTH DAILY 09/07/19   Daiva Eves, Lisette Grinder, MD  citalopram (CELEXA) 40 MG tablet Take 40 mg by mouth daily.    [provider]  colchicine 0.6 MG tablet Take 1 tablet (0.6 mg total) by mouth daily. 05/22/18   Glynn Octave, MD  Dextromethorphan-Guaifenesin (ROBITUSSIN DM PO) Take by mouth.    [provider]  fluticasone (FLONASE) 50 MCG/ACT nasal spray Place 1 spray into both nostrils daily. 11/29/18   Bethel Born, PA-C  folic acid (FOLVITE) 1 MG tablet Take 4 mg by mouth See  admin instructions. Take everyday except on Friday.    [provider]  glipiZIDE (GLUCOTROL XL) 2.5 MG 24 hr tablet Take 2.5 mg by mouth. 12/22/15   [provider]  HYDROcodone-homatropine (HYCODAN) 5-1.5 MG/5ML syrup Take 5 mLs by mouth every 6 (six) hours as needed for cough. 12/09/17   Mardella Layman, MD  hydrOXYzine (ATARAX/VISTARIL) 25 MG tablet Take 25 mg by mouth every 6 (six) hours as needed for anxiety.     [provider]  lisinopril (PRINIVIL,ZESTRIL) 2.5 MG tablet Take 2.5 mg by mouth daily.    [provider]  metFORMIN (GLUCOPHAGE) 500 MG tablet Take 1 tablet (500 mg total) by mouth 2 (two) times daily with a meal. 12/22/15   Allyne Gee, Rosezella Florida, PA-C  metFORMIN (GLUCOPHAGE-XR) 500 MG 24 hr tablet TK 2 TS PO Q MORNING BEFORE BREAKFAST 07/12/17   [provider]  methotrexate (RHEUMATREX) 2.5 MG tablet Take 8 tablets once a week on Friday. 11/24/15   [provider]  ondansetron (ZOFRAN) 4 MG tablet Take 1 tablet (4 mg total) by mouth every 8 (eight) hours as  needed for nausea or vomiting. 03/10/18   Daiva Eves, Lisette Grinder, MD  Phenylephrine-Guaifenesin (MUCUS RELIEF D PO) Take by mouth.    [provider]  terbinafine (LAMISIL) 250 MG tablet  08/28/17   [provider]  warfarin (COUMADIN) 5 MG tablet Take 5-7.5 mg by mouth daily. Take 1.5 (7.5mg ) daily except Sunday take 1 tab    [provider]    Allergies    Penicillins, Shellfish allergy, and Latex  Review of Systems   Review of Systems  Constitutional: Negative for chills and fever.  Respiratory: Negative for shortness of breath.   Cardiovascular: Negative for chest pain.  Gastrointestinal: Negative for abdominal pain, nausea and vomiting.  Musculoskeletal: Positive for arthralgias.  Skin: Positive for color change. Negative for wound.  Neurological: Negative for weakness.       Positive for baseline peripheral neuropathy which is unchanged.     Physical Exam Updated Vital Signs BP (!) 153/99 (BP Location: Left Arm)   Pulse (!) 103   Temp 98.1 F (36.7 C) (Oral)   Resp 16   Ht 6' (1.829 m)   Wt 104.3 kg   SpO2 100%   BMI 31.19 kg/m   Physical Exam Vitals and nursing note reviewed.  Constitutional:      General: He is not in acute distress.    Appearance: He is not ill-appearing or toxic-appearing.  HENT:     Head: Normocephalic and atraumatic.  Cardiovascular:     Rate and Rhythm: Regular rhythm.     Pulses:          Dorsalis pedis pulses are 2+ on the right side and 2+ on the left side.       Posterior tibial pulses are 2+ on the right side and 2+ on the left side.  Pulmonary:     Effort: Pulmonary effort is normal.     Breath sounds: Normal breath sounds.  Abdominal:     General: There is no distension.     Palpations: Abdomen is soft.     Tenderness: There is no abdominal tenderness.  Musculoskeletal:     Comments: Lower extremities: Patient has area of mild erythema that is warm to the touch to the anterior right knee over the patella and just superior to this area.  Erythema/warmth is not circumferential.  No obvious fluctuance or induration.  No open wounds.  No edema.  No joint effusion.  Patient has intact active range of motion throughout with the exception of mild limitation in right knee flexion, able to flex to about 90 degrees.  Patient is tender palpation over the right anterior knee, otherwise nontender.  Skin:    General: Skin is warm and dry.     Capillary Refill: Capillary refill takes less than 2 seconds.  Neurological:     Mental Status: He is alert.     Comments: Alert. Clear speech. Sensation reportedly at baseline to the bilateral lower extremities.. 5/5 strength with plantar/dorsiflexion bilaterally. Patient ambulatory with antalgic gait.  Psychiatric:        Mood and Affect: Mood normal.        Behavior: Behavior normal.    ED Results / Procedures / Treatments   Labs (all labs  ordered are listed, but only abnormal results are displayed) Labs Reviewed - No data to display  EKG None  Radiology DG Knee Complete 4 Views Right  Result Date: 01/18/2020 CLINICAL DATA:  Lea pain after moving furniture. EXAM: RIGHT KNEE - COMPLETE 4+ VIEW  COMPARISON:  None. FINDINGS: No evidence of fracture, dislocation, or joint effusion. No evidence of arthropathy or other focal bone abnormality. Soft tissues are unremarkable. IMPRESSION: Negative. Electronically Signed   By: Misty Stanley M.D.   On: 01/18/2020 14:14    Procedures Procedures (including critical care time)  Medications Ordered in ED Medications - No data to display  ED Course  I have reviewed the triage vital signs and the nursing notes.  Pertinent labs & imaging results that were available during my care of the patient were reviewed by me and considered in my medical decision making (see chart for details).    MDM Rules/Calculators/A&P                     Patient presents to the emergency department with complaints of right knee pain for the past 1 week.  Patient nontoxic-appearing, resting comfortably, vitals with elevated BP, doubt HTN emergency.  Patient is afebrile.  X-ray per triage team is negative for fracture, dislocation, or effusion. Patient has some mild erythema/warmth to the anterior knee, this is not circumferential, he has majority of range of motion intact, he is afebrile, there is no effusion, this does not seem consistent with a septic joint.  Not specifically over the bursa to raise concern for septic bursitis.  Neurovascularly intact distally.  Localized to the anterior knee, no calf tenderness, on Coumadin, do not suspect DVT.  Overall unclear etiology, possible cellulitis.  Discussed findings and plan of care with supervising physician Dr. Regenia Skeeter who has evaluated the patient, in agreement with plan for doxycycline and pain control with orthopedics follow-up. Discussed with pharmacist Corene Cornea  regarding coumadin and doxycycline- would not alter dose currently, recommends having patient follow up closely with the provider that manges his coumadin to discuss dosage changes and or need for INR check, I am in agreement as is thie patient.  I discussed results, treatment plan, need for follow-up, and return precautions with the patient. Provided opportunity for questions, patient confirmed understanding and is in agreement with plan.    Final Clinical Impression(s) / ED Diagnoses Final diagnoses:  Acute pain of right knee    Rx / DC Orders ED Discharge Orders         Ordered    HYDROcodone-acetaminophen (NORCO/VICODIN) 5-325 MG tablet  Every 6 hours PRN     01/18/20 1507    doxycycline (VIBRAMYCIN) 100 MG capsule  2 times daily     01/18/20 [address] [place], PA-C 01/18/20 1509    Sherwood Gambler, MD 01/19/20 0740

## 2020-01-18 NOTE — Discharge Instructions (Signed)
You were seen in the emergency department today for knee pain.  Your x-ray was normal.  We are covering you for infection with an antibiotic, doxycycline, please take this as prescribed.  We are also sending you home with Norco, this is a pain medicine, please do not drive or operate heavy machinery when taking this medicine or drink alcohol or take other sedating medicines.  This medication has Tylenol in it, therefore do not take more Tylenol than the recommended dose should you decide to supplement with Tylenol over-the-counter.  We have prescribed you new medication(s) today. Discuss the medications prescribed today with your pharmacist as they can have adverse effects and interactions with your other medicines including over the counter and prescribed medications. Seek medical evaluation if you start to experience new or abnormal symptoms after taking one of these medicines, seek care immediately if you start to experience difficulty breathing, feeling of your throat closing, facial swelling, or rash as these could be indications of a more serious allergic reaction   Given you are on Coumadin, it is important that you call your clinic that manages your Coumadin to let them know that you are on doxycycline as this can affect your Coumadin levels.  Please call to discuss the dosing with them and to discuss whether or not they need you to come in for an INR check.  Please follow-up with orthopedics in 3 days for reevaluation.  Return to the ER for new or worsening symptoms including but not limited to worsening pain, spreading redness, fever, chills, inability to move the knee, or any other concerns.

## 2020-01-25 ENCOUNTER — Other Ambulatory Visit: Payer: Medicare Other

## 2020-01-26 ENCOUNTER — Encounter: Admit: 2020-01-26 | Discharge: 2020-01-27 | Payer: MEDICARE

## 2020-01-26 ENCOUNTER — Other Ambulatory Visit: Payer: Medicaid Other

## 2020-01-26 ENCOUNTER — Other Ambulatory Visit: Payer: Self-pay

## 2020-01-26 ENCOUNTER — Other Ambulatory Visit (HOSPITAL_COMMUNITY)
Admission: RE | Admit: 2020-01-26 | Discharge: 2020-01-26 | Disposition: A | Discharge status: Home / Self Care | Payer: Medicare Other | Source: Ambulatory Visit | Attending: Infectious Disease | Admitting: Infectious Disease

## 2020-01-26 DIAGNOSIS — B20 Human immunodeficiency virus [HIV] disease: Secondary | ICD-10-CM

## 2020-01-26 DIAGNOSIS — Z86718 Personal history of other venous thrombosis and embolism: Secondary | ICD-10-CM

## 2020-01-26 DIAGNOSIS — Z113 Encounter for screening for infections with a predominantly sexual mode of transmission: Secondary | ICD-10-CM

## 2020-01-26 DIAGNOSIS — E785 Hyperlipidemia, unspecified: Secondary | ICD-10-CM

## 2020-01-26 DIAGNOSIS — L2081 Atopic neurodermatitis: Secondary | ICD-10-CM

## 2020-01-26 DIAGNOSIS — E1165 Type 2 diabetes mellitus with hyperglycemia: Principal | ICD-10-CM

## 2020-01-26 DIAGNOSIS — S8991XD Unspecified injury of right lower leg, subsequent encounter: Principal | ICD-10-CM

## 2020-01-26 DIAGNOSIS — I2699 Other pulmonary embolism without acute cor pulmonale: Principal | ICD-10-CM

## 2020-01-26 MED ORDER — COLCHICINE 0.6 MG TABLET
ORAL_TABLET | 3 refills | 0 days | Status: CP
Start: 2020-01-26 — End: ?

## 2020-01-26 NOTE — Addendum Note (Signed)
Addended by: Clista Bernhardt on: 01/26/2020 09:28 AM   Modules accepted: Orders

## 2020-01-27 LAB — URINE CYTOLOGY ANCILLARY ONLY
Chlamydia: NEGATIVE
Comment: NEGATIVE
Comment: NORMAL
Neisseria Gonorrhea: NEGATIVE

## 2020-01-27 LAB — T-HELPER CELL (CD4) - (RCID CLINIC ONLY)
CD4 % Helper T Cell: 38 % (ref 33–65)
CD4 T Cell Abs: 567 /uL (ref 400–1790)

## 2020-01-28 ENCOUNTER — Telehealth: Payer: Self-pay

## 2020-01-28 NOTE — Telephone Encounter (Signed)
-----   Message from Randall Hiss, MD sent at 01/28/2020  3:29 PM EST ----- Craig [name]'s RPR has not gone down sufficiently He needs either LP to evaluate for Neurosyphlis or we can retreat with 2 IM PCN shots and see what happends with RPR titer

## 2020-01-28 NOTE — Telephone Encounter (Signed)
Attempted to call patient regarding md's note. Unable to reach patient at this time. Left voicemail requesting call back. Lorenso Courier, [place]

## 2020-01-29 ENCOUNTER — Other Ambulatory Visit: Payer: Self-pay

## 2020-01-29 ENCOUNTER — Telehealth: Payer: Self-pay

## 2020-01-29 DIAGNOSIS — A539 Syphilis, unspecified: Secondary | ICD-10-CM

## 2020-01-29 LAB — CBC WITH DIFFERENTIAL/PLATELET
Absolute Monocytes: 352 cells/uL (ref 200–950)
Basophils Absolute: 9 cells/uL (ref 0–200)
Basophils Relative: 0.2 %
Eosinophils Absolute: 172 cells/uL (ref 15–500)
Eosinophils Relative: 3.9 %
HCT: 40 % (ref 38.5–50.0)
Hemoglobin: 13.2 g/dL (ref 13.2–17.1)
Lymphs Abs: 1492 cells/uL (ref 850–3900)
MCH: 28.2 pg (ref 27.0–33.0)
MCHC: 33 g/dL (ref 32.0–36.0)
MCV: 85.5 fL (ref 80.0–100.0)
MPV: 10.2 fL (ref 7.5–12.5)
Monocytes Relative: 8 %
Neutro Abs: 2376 cells/uL (ref 1500–7800)
Neutrophils Relative %: 54 %
Platelets: 225 10*3/uL (ref 140–400)
RBC: 4.68 10*6/uL (ref 4.20–5.80)
RDW: 13.4 % (ref 11.0–15.0)
Total Lymphocyte: 33.9 %
WBC: 4.4 10*3/uL (ref 3.8–10.8)

## 2020-01-29 LAB — COMPLETE METABOLIC PANEL WITH GFR
AG Ratio: 1.1 (calc) (ref 1.0–2.5)
ALT: 24 U/L (ref 9–46)
AST: 21 U/L (ref 10–35)
Albumin: 3.9 g/dL (ref 3.6–5.1)
Alkaline phosphatase (APISO): 73 U/L (ref 35–144)
BUN: 15 mg/dL (ref 7–25)
CO2: 27 mmol/L (ref 20–32)
Calcium: 9.5 mg/dL (ref 8.6–10.3)
Chloride: 105 mmol/L (ref 98–110)
Creat: 1.18 mg/dL (ref 0.70–1.33)
GFR, Est African American: 79 mL/min/{1.73_m2} (ref >=60)
GFR, Est Non African American: 68 mL/min/{1.73_m2} (ref >=60)
Globulin: 3.7 g/dL (calc) (ref 1.9–3.7)
Glucose, Bld: 311 mg/dL — ABNORMAL HIGH (ref 65–99)
Potassium: 4.6 mmol/L (ref 3.5–5.3)
Sodium: 138 mmol/L (ref 135–146)
Total Bilirubin: 0.3 mg/dL (ref 0.2–1.2)
Total Protein: 7.6 g/dL (ref 6.1–8.1)

## 2020-01-29 LAB — HIV-1 RNA QUANT-NO REFLEX-BLD
HIV 1 RNA Quant: <20 copies/mL
HIV-1 RNA Quant, Log: <1.3 Log copies/mL

## 2020-01-29 LAB — LIPID PANEL
Cholesterol: 91 mg/dL (ref <200)
HDL: 36 mg/dL — ABNORMAL LOW (ref >=40)
LDL Cholesterol (Calc): 32 mg/dL (calc)
Non-HDL Cholesterol (Calc): 55 mg/dL (calc) (ref <130)
Total CHOL/HDL Ratio: 2.5 (calc) (ref <5.0)
Triglycerides: 152 mg/dL — ABNORMAL HIGH (ref <150)

## 2020-01-29 LAB — RPR TITER: RPR Titer: 1:16 {titer} — ABNORMAL HIGH

## 2020-01-29 LAB — FLUORESCENT TREPONEMAL AB(FTA)-IGG-BLD: Fluorescent Treponemal ABS: REACTIVE — AB

## 2020-01-29 LAB — RPR: RPR Ser Ql: REACTIVE — AB

## 2020-01-29 MED ORDER — DOXYCYCLINE HYCLATE 100 MG PO TABS: 100.0000 mg | ORAL_TABLET | Freq: Two times a day (BID) | ORAL | 0 refills | Status: AC

## 2020-01-29 NOTE — Telephone Encounter (Signed)
We can give him 28 days of doxycycline.  Ultimately if he has neurosyphilis though he may need to be desensitized to penicillin

## 2020-01-29 NOTE — Telephone Encounter (Signed)
100mg bid

## 2020-01-29 NOTE — Telephone Encounter (Signed)
Called patient back with further instruction from the provider. Left message informing patient that a 28 day supply of Doxycycline has been sent to [facility] on [address]. Asked patient to call back to schedule follow up lab appointment in 3 months to check for effective treatment.   Sheylin Scharnhorst Loyola Mast, RN

## 2020-01-29 NOTE — Telephone Encounter (Signed)
When would you like labs redrawn?

## 2020-01-29 NOTE — Telephone Encounter (Signed)
Patient called back. Relayed provider note to patient. Patient is allergic to penicillin and was wondering if there was another option for treatment. Stated RN would reach out to Dr. Daiva Eves for alternatives.   Craig Grissinger Loyola Mast, RN

## 2020-01-29 NOTE — Telephone Encounter (Signed)
Doxycycline BID for 28 days, what dosage?  Would you like him to follow up here for retesting after completion or 3 months? Thanks!  Brexlee Heberlein Loyola Mast, RN

## 2020-02-01 NOTE — Telephone Encounter (Signed)
RPR should be checked in 4-6 months

## 2020-02-08 ENCOUNTER — Encounter: Payer: Medicare Other | Admitting: Infectious Disease

## 2020-03-07 ENCOUNTER — Encounter: Payer: Medicare Other | Admitting: Infectious Disease

## 2020-03-10 DIAGNOSIS — F329 Major depressive disorder, single episode, unspecified: Principal | ICD-10-CM

## 2020-03-11 ENCOUNTER — Encounter: Admit: 2020-03-11 | Discharge: 2020-03-12 | Payer: MEDICARE | Attending: Internal Medicine | Primary: Internal Medicine

## 2020-03-11 DIAGNOSIS — R739 Hyperglycemia, unspecified: Principal | ICD-10-CM

## 2020-03-15 ENCOUNTER — Encounter: Payer: Self-pay | Admitting: Infectious Disease

## 2020-03-15 MED ORDER — SERTRALINE 50 MG TABLET
ORAL_TABLET | Freq: Every day | ORAL | 3 refills | 90.00000 days | Status: CP
Start: 2020-03-15 — End: 2021-03-15

## 2020-03-23 ENCOUNTER — Encounter: Admit: 2020-03-23 | Discharge: 2020-03-24 | Payer: MEDICARE

## 2020-03-23 DIAGNOSIS — I2699 Other pulmonary embolism without acute cor pulmonale: Principal | ICD-10-CM

## 2020-03-23 DIAGNOSIS — E1165 Type 2 diabetes mellitus with hyperglycemia: Principal | ICD-10-CM

## 2020-03-23 DIAGNOSIS — S8991XD Unspecified injury of right lower leg, subsequent encounter: Principal | ICD-10-CM

## 2020-03-23 MED ORDER — LANTUS SOLOSTAR U-100 INSULIN 100 UNIT/ML (3 ML) SUBCUTANEOUS PEN
Freq: Every evening | SUBCUTANEOUS | prn refills | 53.00000 days | Status: CP
Start: 2020-03-23 — End: 2021-03-23

## 2020-03-23 MED ORDER — INSULIN LISPRO (U-100) 100 UNIT/ML SUBCUTANEOUS PEN
Freq: Three times a day (TID) | SUBCUTANEOUS | prn refills | 36.00000 days | Status: CP
Start: 2020-03-23 — End: 2021-03-23

## 2020-03-25 ENCOUNTER — Other Ambulatory Visit: Payer: Self-pay | Admitting: Infectious Disease

## 2020-03-25 DIAGNOSIS — B20 Human immunodeficiency virus [HIV] disease: Secondary | ICD-10-CM

## 2020-04-07 ENCOUNTER — Encounter: Admit: 2020-04-07 | Discharge: 2020-04-08 | Payer: MEDICARE

## 2020-04-07 DIAGNOSIS — E1165 Type 2 diabetes mellitus with hyperglycemia: Principal | ICD-10-CM

## 2020-04-07 DIAGNOSIS — I2699 Other pulmonary embolism without acute cor pulmonale: Principal | ICD-10-CM

## 2020-04-07 DIAGNOSIS — L309 Dermatitis, unspecified: Principal | ICD-10-CM

## 2020-04-07 MED ORDER — INSULIN LISPRO (U-100) 100 UNIT/ML SUBCUTANEOUS PEN
Freq: Three times a day (TID) | SUBCUTANEOUS | prn refills | 28.00000 days | Status: CP
Start: 2020-04-07 — End: 2021-04-07

## 2020-04-07 MED ORDER — LANTUS SOLOSTAR U-100 INSULIN 100 UNIT/ML (3 ML) SUBCUTANEOUS PEN
Freq: Every evening | SUBCUTANEOUS | prn refills | 44.00000 days | Status: CP
Start: 2020-04-07 — End: 2021-04-07

## 2020-04-07 MED ORDER — FREESTYLE LIBRE 14 DAY SENSOR KIT
ORAL | 0 refills | 0.00000 days | Status: CP
Start: 2020-04-07 — End: ?

## 2020-04-07 MED ORDER — FREESTYLE LIBRE 14 DAY READER
0 refills | 0.00000 days | Status: CP
Start: 2020-04-07 — End: ?

## 2020-04-07 MED ORDER — WARFARIN 5 MG TABLET
ORAL_TABLET | prn refills | 0 days | Status: CP
Start: 2020-04-07 — End: ?

## 2020-04-14 ENCOUNTER — Encounter: Admit: 2020-04-14 | Discharge: 2020-04-15 | Payer: MEDICARE

## 2020-04-14 DIAGNOSIS — L309 Dermatitis, unspecified: Principal | ICD-10-CM

## 2020-04-14 DIAGNOSIS — I2699 Other pulmonary embolism without acute cor pulmonale: Principal | ICD-10-CM

## 2020-04-14 DIAGNOSIS — E1165 Type 2 diabetes mellitus with hyperglycemia: Principal | ICD-10-CM

## 2020-04-14 MED ORDER — INSULIN LISPRO (U-100) 100 UNIT/ML SUBCUTANEOUS PEN
prn refills | 0.00000 days | Status: CP
Start: 2020-04-14 — End: ?

## 2020-04-14 MED ORDER — RYBELSUS 3 MG TABLET
ORAL_TABLET | Freq: Every day | ORAL | 11 refills | 0 days | Status: CP
Start: 2020-04-14 — End: ?

## 2020-05-02 ENCOUNTER — Ambulatory Visit: Admit: 2020-05-02 | Discharge: 2020-05-03 | Payer: MEDICARE

## 2020-05-02 DIAGNOSIS — I2699 Other pulmonary embolism without acute cor pulmonale: Principal | ICD-10-CM

## 2020-05-02 DIAGNOSIS — E1365 Other specified diabetes mellitus with hyperglycemia: Principal | ICD-10-CM

## 2020-05-02 MED ORDER — RYBELSUS 7 MG TABLET
ORAL_TABLET | Freq: Every day | ORAL | 11 refills | 0 days | Status: CP
Start: 2020-05-02 — End: ?

## 2020-05-02 MED ORDER — INSULIN LISPRO (U-100) 100 UNIT/ML SUBCUTANEOUS PEN
Freq: Three times a day (TID) | SUBCUTANEOUS | prn refills | 20.00000 days | Status: CP
Start: 2020-05-02 — End: ?

## 2020-05-02 MED ORDER — LANTUS SOLOSTAR U-100 INSULIN 100 UNIT/ML (3 ML) SUBCUTANEOUS PEN
Freq: Every evening | SUBCUTANEOUS | prn refills | 37 days | Status: CP
Start: 2020-05-02 — End: 2021-05-02

## 2020-05-13 MED ORDER — RYBELSUS 3 MG TABLET
0.00000 days
Start: 2020-05-13 — End: 2020-05-17

## 2020-05-17 ENCOUNTER — Ambulatory Visit: Admit: 2020-05-17 | Discharge: 2020-05-18 | Payer: MEDICARE

## 2020-05-17 DIAGNOSIS — E1365 Other specified diabetes mellitus with hyperglycemia: Principal | ICD-10-CM

## 2020-05-17 DIAGNOSIS — I2699 Other pulmonary embolism without acute cor pulmonale: Principal | ICD-10-CM

## 2020-05-17 DIAGNOSIS — I10 Essential (primary) hypertension: Principal | ICD-10-CM

## 2020-05-30 ENCOUNTER — Encounter: Admit: 2020-05-30 | Discharge: 2020-05-31 | Payer: MEDICARE

## 2020-05-30 DIAGNOSIS — E1165 Type 2 diabetes mellitus with hyperglycemia: Principal | ICD-10-CM

## 2020-06-02 ENCOUNTER — Other Ambulatory Visit: Payer: Self-pay

## 2020-06-02 ENCOUNTER — Emergency Department (HOSPITAL_COMMUNITY): Payer: Medicare Other

## 2020-06-02 ENCOUNTER — Encounter (HOSPITAL_COMMUNITY): Payer: Self-pay

## 2020-06-02 ENCOUNTER — Emergency Department (HOSPITAL_COMMUNITY)
Admission: EM | Admit: 2020-06-02 | Discharge: 2020-06-02 | Disposition: A | Discharge status: Home / Self Care | Payer: Medicare Other | Attending: Emergency Medicine | Admitting: Emergency Medicine

## 2020-06-02 DIAGNOSIS — Z79899 Other long term (current) drug therapy: Secondary | ICD-10-CM | POA: Diagnosis not present

## 2020-06-02 DIAGNOSIS — I5032 Chronic diastolic (congestive) heart failure: Secondary | ICD-10-CM | POA: Diagnosis not present

## 2020-06-02 DIAGNOSIS — Z7984 Long term (current) use of oral hypoglycemic drugs: Secondary | ICD-10-CM | POA: Insufficient documentation

## 2020-06-02 DIAGNOSIS — I110 Hypertensive heart disease with heart failure: Secondary | ICD-10-CM | POA: Insufficient documentation

## 2020-06-02 DIAGNOSIS — E119 Type 2 diabetes mellitus without complications: Secondary | ICD-10-CM | POA: Insufficient documentation

## 2020-06-02 DIAGNOSIS — H5712 Ocular pain, left eye: Secondary | ICD-10-CM | POA: Diagnosis present

## 2020-06-02 DIAGNOSIS — I2510 Atherosclerotic heart disease of native coronary artery without angina pectoris: Secondary | ICD-10-CM | POA: Insufficient documentation

## 2020-06-02 DIAGNOSIS — L03213 Periorbital cellulitis: Secondary | ICD-10-CM | POA: Diagnosis not present

## 2020-06-02 DIAGNOSIS — Z91040 Latex allergy status: Secondary | ICD-10-CM | POA: Insufficient documentation

## 2020-06-02 DIAGNOSIS — Z21 Asymptomatic human immunodeficiency virus [HIV] infection status: Secondary | ICD-10-CM | POA: Diagnosis not present

## 2020-06-02 LAB — I-STAT CHEM 8, ED
BUN: 13 mg/dL (ref 6–20)
Calcium, Ion: 1.08 mmol/L — ABNORMAL LOW (ref 1.15–1.40)
Chloride: 106 mmol/L (ref 98–111)
Creatinine, Ser: 1 mg/dL (ref 0.61–1.24)
Glucose, Bld: 123 mg/dL — ABNORMAL HIGH (ref 70–99)
HCT: 43 % (ref 39.0–52.0)
Hemoglobin: 14.6 g/dL (ref 13.0–17.0)
Potassium: 5.3 mmol/L — ABNORMAL HIGH (ref 3.5–5.1)
Sodium: 142 mmol/L (ref 135–145)
TCO2: 27 mmol/L (ref 22–32)

## 2020-06-02 LAB — CBG MONITORING, ED: Glucose-Capillary: 121 mg/dL — ABNORMAL HIGH (ref 70–99)

## 2020-06-02 MED ORDER — CLINDAMYCIN PHOSPHATE 600 MG/50ML IV SOLN
600.0000 mg | Freq: Once | INTRAVENOUS | Status: AC
  Administered 2020-06-02: 600 mg via INTRAVENOUS
  Filled 2020-06-02: qty 50

## 2020-06-02 MED ORDER — CEFDINIR 300 MG PO CAPS: 300.0000 mg | ORAL_CAPSULE | Freq: Two times a day (BID) | ORAL | 0 refills | Status: AC

## 2020-06-02 MED ORDER — CLINDAMYCIN HCL 300 MG PO CAPS
300.0000 mg | ORAL_CAPSULE | Freq: Three times a day (TID) | ORAL | 0 refills | Status: AC
Start: 2020-06-02 — End: 2020-06-09

## 2020-06-02 MED ORDER — FLUORESCEIN SODIUM 1 MG OP STRP
1.0000 | ORAL_STRIP | Freq: Once | OPHTHALMIC | Status: AC
  Administered 2020-06-02: 1 via OPHTHALMIC
  Filled 2020-06-02: qty 1

## 2020-06-02 MED ORDER — TETRACAINE HCL 0.5 % OP SOLN
1.0000 [drp] | Freq: Once | OPHTHALMIC | Status: AC
  Administered 2020-06-02: 1 [drp] via OPHTHALMIC
  Filled 2020-06-02: qty 4

## 2020-06-02 MED ORDER — IOHEXOL 300 MG/ML  SOLN
75.0000 mL | Freq: Once | INTRAMUSCULAR | Status: AC | PRN
  Administered 2020-06-02: 75 mL via INTRAVENOUS

## 2020-06-02 NOTE — Discharge Instructions (Signed)
Take the antibiotics as directed. Return to the ER if your symptoms do not improve in 24 to 48 hours, if your symptoms start to worsen this time.,  You develop more swelling, fevers or pain with moving your eye.

## 2020-06-02 NOTE — ED Notes (Signed)
EMS endosed administered 150 mcg of Fentanyl IN for pain control.

## 2020-06-02 NOTE — ED Triage Notes (Signed)
patient complains of increased redness, tingling, swelling to left eyelid that started on Tuesday. Reports drainage from same. Today eyelid swollen shut

## 2020-06-02 NOTE — ED Provider Notes (Addendum)
[facility] EMERGENCY DEPARTMENT Provider Note   CSN: [id number] Arrival date & time: 06/02/20  1128     History No chief complaint on file.   Craig [name] is a 59 y.o. 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  2 days ago noticed that his left eye was irritated with a small amount of discharge.  This morning woke up with his eyelids matted together and significant swelling of his upper eyelid.  He reports continued irritation but denies specific pain with moving his eyes.  He has not taken any medications or eyedrops to help with discomfort.  He denies any trauma to the area, denies any foreign body sensation.  He last saw an eye doctor earlier in the year.  He denies any sick contacts with similar symptoms, fever, headache, neck stiffness, contact lens use, photophobia.  HPI     Past Medical History:  Diagnosis Date  . AIDS (HCC) 05/24/2015  . Allergic rhinitis 01/25/2016  . Atopic dermatitis 12/30/2017  . 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 Deep vein thrombosis (HCC) 05/09/2011   Overview:  Jan 2012   . Diabetes mellitus, controlled (HCC) 01/25/2016  . Dysplasia of anus   . Esophageal reflux   . 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 Hernia, inguinal 04/09/2013  . HIV (human immunodeficiency virus infection) (HCC)   . Horseshoe tear of retina without detachment 02-13-12  . 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  . Molluscum contagiosum 09-29-2012  . 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 infarction   . Other convulsions   . PE (pulmonary embolism) 04/09/2013   Overview:  Jan 2012   . Personal history of venous thrombosis and embolism   . Secondary  iridocyclitis, infectious   . Septic arthritis of wrist (HCC)    rt  . Shortness of breath dyspnea   . Unspecified hereditary and idiopathic peripheral neuropathy   . 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 Viral URI 03/10/2018    Patient Active Problem List   Diagnosis Date Noted  . Herpes zoster without complication 07/22/2018  . Viral URI 03/10/2018  . Atopic dermatitis 12/30/2017  . Obesity (BMI 30.0-34.9) 08/22/2017  . Diastasis recti 01/24/2017  . Diabetes mellitus, controlled (HCC) 01/25/2016  . Allergic rhinitis 01/25/2016  . Elevated INR   . Gout of right knee 07/28/2015  . HIV disease (HCC) 05/24/2015  . AIDS (HCC) 05/24/2015  . Iatrogenic hypotension 11/22/2014  . HIV infection (HCC) 03/08/2014  . History of pulmonary embolus (PE) 03/08/2014  . History of DVT (deep vein thrombosis) 03/08/2014  . H/O deep venous thrombosis 03/08/2014  . 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 H/O thrombosis 03/08/2014  . Long term current use of anticoagulant therapy 02/26/2014  . Coronary atherosclerosis of unspecified type of vessel, native or graft 02/26/2014  . Dermatitis 02/26/2014  . Depressive disorder, not elsewhere classified 02/26/2014  . Asthma, chronic 02/26/2014  . Coronary atherosclerosis 02/26/2014  . Clinical depression 02/26/2014  . Chronic diastolic heart failure (HCC) 04/09/2013  . Esophageal reflux 04/09/2013  . Essential hypertension 04/09/2013  . Gout 04/09/2013  . 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 Chronic pulmonary heart disease (HCC) 04/09/2013  . Congestive heart failure (HCC) 04/09/2013  . Convulsions (HCC) 04/09/2013  .  Diastolic heart failure (HCC) 04/09/2013  . History of anticoagulant therapy 04/09/2013  . Pulmonary embolism (HCC) 04/09/2013  . Allergy to latex 04/09/2013  . Inguinal hernia 04/09/2013  . 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 Dysplasia of anus 12/29/2012  . Molluscum contagiosum 09/29/2012  . Cytomegalovirus infection  (HCC) 02/13/2012  . Horseshoe tear of retina 02/13/2012  . Deep vein thrombosis (DVT) (HCC) 05/09/2011  . Airway hyperreactivity 09/08/2010  . Hereditary and idiopathic peripheral neuropathy 09/08/2010  . Human immunodeficiency virus (HIV) disease (HCC) 12/14/2009    Past Surgical History:  Procedure Laterality Date  . COLONOSCOPY    . I & D EXTREMITY Right 07/28/2015   Procedure: IRRIGATION AND DEBRIDEMENT WRIST;  Surgeon: Knute Neu, MD;  Location: [facility] OR;  Service: Plastics;  Laterality: Right;       No family history on file.  Social History   Tobacco Use  . Smoking status: Never Smoker  . Smokeless tobacco: Never Used  Vaping Use  . Vaping Use: Never used  Substance Use Topics  . Alcohol use: No    Alcohol/week: 0.0 standard drinks  . Drug use: No    Home Medications Prior to Admission medications   Medication Sig Start Date End Date Taking? Authorizing Provider  ACCU-CHEK AVIVA PLUS test strip  07/16/17   [provider]  albuterol (PROVENTIL HFA;VENTOLIN HFA) 108 (90 BASE) MCG/ACT inhaler Inhale 2 puffs into the lungs every 6 (six) hours as needed for wheezing or shortness of breath.    [provider]  Alcohol Swabs (ALCOHOL PREP) 70 % PADS  07/16/17   [provider]  ASSURE COMFORT LANCETS 30G MISC  07/16/17   [provider]  atorvastatin (LIPITOR) 40 MG tablet Take 40 mg by mouth daily.    [provider]  BIKTARVY 50-200-25 MG TABS tablet TAKE 1 TABLET BY MOUTH DAILY 09/07/19   Daiva Eves, Lisette Grinder, MD  cefdinir (OMNICEF) 300 MG capsule Take 1 capsule (300 mg total) by mouth 2 (two) times daily for 7 days. 06/02/20 06/09/20  Hamsini Verrilli, PA-C  citalopram (CELEXA) 40 MG tablet Take 40 mg by mouth daily.    [provider]  clindamycin (CLEOCIN) 300 MG capsule Take 1 capsule (300 mg total) by mouth every 8 (eight) hours for 7 days. 06/02/20 06/09/20  Kha Hari, PA-C  colchicine 0.6 MG tablet Take 1 tablet (0.6 mg  total) by mouth daily. 05/22/18   Glynn Octave, MD  Dextromethorphan-Guaifenesin (ROBITUSSIN DM PO) Take by mouth.    [provider]  fluticasone (FLONASE) 50 MCG/ACT nasal spray Place 1 spray into both nostrils daily. 11/29/18   Bethel Born, PA-C  folic acid (FOLVITE) 1 MG tablet Take 4 mg by mouth See admin instructions. Take everyday except on Friday.    [provider]  glipiZIDE (GLUCOTROL XL) 2.5 MG 24 hr tablet Take 2.5 mg by mouth. 12/22/15   [provider]  HYDROcodone-acetaminophen (NORCO/VICODIN) 5-325 MG tablet Take 1-2 tablets by mouth every 6 (six) hours as needed for moderate pain. 01/18/20   Petrucelli, Samantha R, PA-C  HYDROcodone-homatropine (HYCODAN) 5-1.5 MG/5ML syrup Take 5 mLs by mouth every 6 (six) hours as needed for cough. 12/09/17   Mardella Layman, MD  hydrOXYzine (ATARAX/VISTARIL) 25 MG tablet Take 25 mg by mouth every 6 (six) hours as needed for anxiety.     [provider]  lisinopril (PRINIVIL,ZESTRIL) 2.5 MG tablet Take 2.5 mg by mouth daily.    [provider]  metFORMIN (  GLUCOPHAGE) 500 MG tablet Take 1 tablet (500 mg total) by mouth 2 (two) times daily with a meal. 12/22/15   Garlon HatchetSanders, Lisa M, PA-C  metFORMIN (GLUCOPHAGE-XR) 500 MG 24 hr tablet TK 2 TS PO Q MORNING BEFORE BREAKFAST 07/12/17   [provider]  methotrexate (RHEUMATREX) 2.5 MG tablet Take 8 tablets once a week on Friday. 11/24/15   [provider]  ondansetron (ZOFRAN) 4 MG tablet Take 1 tablet (4 mg total) by mouth every 8 (eight) hours as needed for nausea or vomiting. 03/10/18   Daiva EvesVan Dam, Lisette Grinderornelius N, MD  Phenylephrine-Guaifenesin (MUCUS RELIEF D PO) Take by mouth.    [provider]  terbinafine (LAMISIL) 250 MG tablet  08/28/17   [provider]  warfarin (COUMADIN) 5 MG tablet Take 5-7.5 mg by mouth daily. Take 1.5 (7.5mg ) daily except Sunday take 1 tab    [provider]    Allergies    Penicillins,  Shellfish allergy, and Latex  Review of Systems   Review of Systems  Constitutional: Negative for appetite change, chills and fever.  HENT: Negative for ear pain, rhinorrhea, sneezing and sore throat.   Eyes: Positive for discharge, redness and itching. Negative for photophobia and visual disturbance.  Respiratory: Negative for cough, chest tightness, shortness of breath and wheezing.   Cardiovascular: Negative for chest pain and palpitations.  Gastrointestinal: Negative for abdominal pain, blood in stool, constipation, diarrhea, nausea and vomiting.  Genitourinary: Negative for dysuria, hematuria and urgency.  Musculoskeletal: Negative for myalgias.  Skin: Negative for rash.  Neurological: Negative for dizziness, weakness and light-headedness.    Physical Exam Updated Vital Signs BP (!) 149/114   Pulse 88   Temp 97.9 F (36.6 C) (Oral)   Resp 16   Ht 6' (1.829 m)   Wt 104.3 kg   SpO2 94%   BMI 31.19 kg/m   Physical Exam Vitals and nursing note reviewed.  Constitutional:      General: He is not in acute distress.    Appearance: He is well-developed.  HENT:     Head: Normocephalic and atraumatic.     Nose: Nose normal.  Eyes:     General: No scleral icterus.       Right eye: No discharge.        Left eye: Discharge present.    Extraocular Movements: Extraocular movements intact.     Conjunctiva/sclera:     Left eye: Left conjunctiva is injected.     Pupils: Pupils are equal, round, and reactive to light.     Comments: Significant swelling of the L upper eyelid. Left eye with injected conjunctiva, no tenderness to palpation.  Mild purulent tearful drainage noted.  No foreign bodies noted.  No pain with EOMs.  No chemosis, proptosis, or consensual photophobia. Fluorescein stain with no corneal abrasions, foreign bodies, dendritic lesions, ulcerations, negative Sidel sign.  Cardiovascular:     Rate and Rhythm: Normal rate and regular rhythm.     Heart sounds: Normal  heart sounds. No murmur heard.  No friction rub. No gallop.   Pulmonary:     Effort: Pulmonary effort is normal. No respiratory distress.     Breath sounds: Normal breath sounds.  Abdominal:     General: Bowel sounds are normal. There is no distension.     Palpations: Abdomen is soft.     Tenderness: There is no abdominal tenderness. There is no guarding.  Musculoskeletal:        General: Normal range of motion.  Cervical back: Normal range of motion and neck supple.  Skin:    General: Skin is warm and dry.     Findings: No rash.  Neurological:     Mental Status: He is alert.     Motor: No abnormal muscle tone.     Coordination: Coordination normal.     ED Results / Procedures / Treatments   Labs (all labs ordered are listed, but only abnormal results are displayed) Labs Reviewed  CBG MONITORING, ED - Abnormal; Notable for the following components:      Result Value   Glucose-Capillary 121 (*)    All other components within normal limits  I-STAT CHEM 8, ED - Abnormal; Notable for the following components:   Potassium 5.3 (*)    Glucose, Bld 123 (*)    Calcium, Ion 1.08 (*)    All other components within normal limits    EKG None  Radiology CT Orbits W Contrast  Result Date: 06/02/2020 CLINICAL DATA:  59 year old male with increased left eye drainage and swelling x2 days. Swollen shut today. EXAM: CT ORBITS WITH CONTRAST TECHNIQUE: Multidetector CT images was performed according to the standard protocol following intravenous contrast administration. CONTRAST:  73mL OMNIPAQUE IOHEXOL 300 MG/ML  SOLN COMPARISON:  Head CT 01/26/2015. FINDINGS: Orbits:  Intact orbital walls. Globes appear symmetric and intact. There is no postseptal orbital mass or inflammation identified. There is asymmetric preseptal left orbits soft tissue swelling and thickening (series 3, image 30). No soft tissue gas. No fluid collection identified. Only minor involvement of the left forehead and  premalar space. Other osseous structures: Absent maxillary dentition. No acute osseous abnormality identified. Visualized sinuses: Paranasal sinuses are clear. Tympanic cavities are clear. There is a mild left mastoid effusion, new since 2016. Right mastoids are clear. Soft tissues: Negative visible pharynx, parapharyngeal and retropharyngeal spaces. Negative visible masticator and parotid spaces. Visible major vascular structures at the skull base appear patent. The left vertebral artery appears dominant. Bilateral ICA siphon calcified plaque. No upper cervical lymphadenopathy. Limited intracranial: Stable since 2016. IMPRESSION: 1. Left side Preseptal Orbital Cellulitis. No abscess or complicating features. 2. Mild left mastoid effusion, new since 2016. Electronically Signed   By: Genevie Ann M.D.   On: 06/02/2020 15:59    Procedures Procedures (including critical care time)  Medications Ordered in ED Medications  fluorescein ophthalmic strip 1 strip (1 strip Left Eye Given 06/02/20 1405)  tetracaine (PONTOCAINE) 0.5 % ophthalmic solution 1 drop (1 drop Left Eye Given 06/02/20 1405)  iohexol (OMNIPAQUE) 300 MG/ML solution 75 mL (75 mLs Intravenous Contrast Given 06/02/20 1504)  clindamycin (CLEOCIN) IVPB 600 mg (600 mg Intravenous New Bag/Given 06/02/20 1729)    ED Course  I have reviewed the triage vital signs and the nursing notes.  Pertinent labs & imaging results that were available during my care of the patient were reviewed by me and considered in my medical decision making (see chart for details).  Clinical Course as of Jun 02 1824  Thu Jun 02, 2020  1624 Per pharmacy recommendations- will give Clinda 600mg  IV x1 here and discharge with PO course.   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  There is swelling of the upper eyelid as well as purulent drainage without any pain or changes to EOMs.  He is afebrile without recent use of antipyretics.  No trauma noted to the area.  Fluorescein exam without any findings.  CT scan shows preseptal cellulitis.  Patient was given IV antibiotics here and will be discharged with clindamycin and cefdinir.  He is allergic to penicillins but has taken cephalosporins in the past without any reactions.  He is told to follow-up with infectious disease as well as ophthalmology.  Patient is agreeable to the plan.  He remains hemodynamically stable.  All imaging, if done today, including plain films, CT scans, and ultrasounds, independently reviewed by me, and interpretations confirmed via formal radiology reads.  Patient is hemodynamically stable, in NAD, and able to ambulate in the ED. Evaluation does not show pathology that would require ongoing emergent intervention or inpatient treatment. I explained the diagnosis to the patient. Pain has been managed and has no complaints prior to discharge. Patient is comfortable with above plan and is stable for discharge at this time. All questions were answered prior to disposition. Strict return precautions for returning to the ED were discussed. Encouraged follow up with PCP.   An After Visit Summary was printed and given to the patient.   Portions of this note were generated with Scientist, clinical (histocompatibility and immunogenetics). Dictation errors may occur despite best attempts at proofreading.  Final Clinical Impression(s) / ED Diagnoses Final diagnoses:  Preseptal cellulitis of left eye    Rx / DC Orders ED Discharge Orders         Ordered    clindamycin (CLEOCIN) 300 MG capsule  Every 8 hours     Discontinue  Reprint     06/02/20 1815     cefdinir (OMNICEF) 300 MG capsule  2 times daily     Discontinue  Reprint     06/02/20 1815             Dietrich Pates, PA-C 06/02/20 2057    Bethann Berkshire, MD 06/06/20 1519

## 2020-06-03 ENCOUNTER — Telehealth: Payer: Self-pay | Admitting: *Deleted

## 2020-06-03 NOTE — Telephone Encounter (Signed)
Per Dr Daiva Eves, patient need reconnected to clinic.  Sent message to front desk to schedule labs, follow up. Patient should follow with PCP and ophthalmology per ER directions as well. Andree Coss, RN

## 2020-06-03 NOTE — Telephone Encounter (Signed)
Called and left patient a voicemail to give [place] a call back and schedule him some appointments

## 2020-06-07 ENCOUNTER — Ambulatory Visit: Admit: 2020-06-07 | Payer: MEDICARE

## 2020-06-08 ENCOUNTER — Ambulatory Visit
Admit: 2020-06-08 | Discharge: 2020-06-09 | Payer: MEDICARE | Attending: Student in an Organized Health Care Education/Training Program | Primary: Student in an Organized Health Care Education/Training Program

## 2020-06-08 DIAGNOSIS — H029 Unspecified disorder of eyelid: Principal | ICD-10-CM

## 2020-06-09 ENCOUNTER — Encounter: Admit: 2020-06-09 | Discharge: 2020-06-10 | Payer: MEDICARE

## 2020-06-09 DIAGNOSIS — E1365 Other specified diabetes mellitus with hyperglycemia: Principal | ICD-10-CM

## 2020-06-09 DIAGNOSIS — H029 Unspecified disorder of eyelid: Principal | ICD-10-CM

## 2020-06-09 DIAGNOSIS — I2699 Other pulmonary embolism without acute cor pulmonale: Principal | ICD-10-CM

## 2020-06-09 MED ORDER — INSULIN LISPRO (U-100) 100 UNIT/ML SUBCUTANEOUS PEN
Freq: Three times a day (TID) | SUBCUTANEOUS | prn refills | 19 days | Status: CP
Start: 2020-06-09 — End: ?

## 2020-06-09 MED ORDER — LANTUS SOLOSTAR U-100 INSULIN 100 UNIT/ML (3 ML) SUBCUTANEOUS PEN
Freq: Every evening | SUBCUTANEOUS | prn refills | 33.00000 days | Status: CP
Start: 2020-06-09 — End: 2021-06-09

## 2020-06-30 ENCOUNTER — Ambulatory Visit: Admit: 2020-06-30 | Discharge: 2020-07-01 | Payer: MEDICARE

## 2020-06-30 DIAGNOSIS — H029 Unspecified disorder of eyelid: Principal | ICD-10-CM

## 2020-06-30 DIAGNOSIS — E1365 Other specified diabetes mellitus with hyperglycemia: Principal | ICD-10-CM

## 2020-06-30 DIAGNOSIS — I2699 Other pulmonary embolism without acute cor pulmonale: Principal | ICD-10-CM

## 2020-06-30 MED ORDER — RYBELSUS 14 MG TABLET
ORAL_TABLET | Freq: Every day | ORAL | prn refills | 0 days | Status: CP
Start: 2020-06-30 — End: ?

## 2020-07-01 MED ORDER — OMEPRAZOLE 40 MG CAPSULE,DELAYED RELEASE
ORAL_CAPSULE | 1 refills | 0 days
Start: 2020-07-01 — End: ?

## 2020-07-04 MED ORDER — OMEPRAZOLE 40 MG CAPSULE,DELAYED RELEASE
ORAL_CAPSULE | 0 refills | 0 days | Status: CP
Start: 2020-07-04 — End: ?

## 2020-07-05 ENCOUNTER — Other Ambulatory Visit: Payer: Self-pay | Admitting: Infectious Disease

## 2020-07-05 DIAGNOSIS — B20 Human immunodeficiency virus [HIV] disease: Secondary | ICD-10-CM

## 2020-07-06 ENCOUNTER — Other Ambulatory Visit: Payer: Medicare Other

## 2020-07-06 ENCOUNTER — Other Ambulatory Visit: Payer: Self-pay

## 2020-07-06 DIAGNOSIS — Z113 Encounter for screening for infections with a predominantly sexual mode of transmission: Secondary | ICD-10-CM

## 2020-07-06 DIAGNOSIS — B20 Human immunodeficiency virus [HIV] disease: Secondary | ICD-10-CM

## 2020-07-07 ENCOUNTER — Other Ambulatory Visit: Payer: Self-pay

## 2020-07-07 DIAGNOSIS — B20 Human immunodeficiency virus [HIV] disease: Secondary | ICD-10-CM

## 2020-07-07 LAB — T-HELPER CELL (CD4) - (RCID CLINIC ONLY)
CD4 % Helper T Cell: 39 % (ref 33–65)
CD4 T Cell Abs: 517 /uL (ref 400–1790)

## 2020-07-07 MED ORDER — BIKTARVY 50-200-25 MG PO TABS: 1.0000 | ORAL_TABLET | Freq: Every day | ORAL | 0 refills | Status: DC

## 2020-07-08 LAB — COMPLETE METABOLIC PANEL WITH GFR
AG Ratio: 1.1 (calc) (ref 1.0–2.5)
ALT: 22 U/L (ref 9–46)
AST: 27 U/L (ref 10–35)
Albumin: 4 g/dL (ref 3.6–5.1)
Alkaline phosphatase (APISO): 73 U/L (ref 35–144)
BUN: 12 mg/dL (ref 7–25)
CO2: 24 mmol/L (ref 20–32)
Calcium: 8.9 mg/dL (ref 8.6–10.3)
Chloride: 107 mmol/L (ref 98–110)
Creat: 1.14 mg/dL (ref 0.70–1.33)
GFR, Est African American: 82 mL/min/{1.73_m2} (ref >=60)
GFR, Est Non African American: 71 mL/min/{1.73_m2} (ref >=60)
Globulin: 3.8 g/dL (calc) — ABNORMAL HIGH (ref 1.9–3.7)
Glucose, Bld: 75 mg/dL (ref 65–99)
Potassium: 4 mmol/L (ref 3.5–5.3)
Sodium: 139 mmol/L (ref 135–146)
Total Bilirubin: 0.3 mg/dL (ref 0.2–1.2)
Total Protein: 7.8 g/dL (ref 6.1–8.1)

## 2020-07-08 LAB — CBC WITH DIFFERENTIAL/PLATELET
Absolute Monocytes: 330 cells/uL (ref 200–950)
Basophils Absolute: 22 cells/uL (ref 0–200)
Basophils Relative: 0.4 %
Eosinophils Absolute: 160 cells/uL (ref 15–500)
Eosinophils Relative: 2.9 %
HCT: 38.5 % (ref 38.5–50.0)
Hemoglobin: 12.6 g/dL — ABNORMAL LOW (ref 13.2–17.1)
Lymphs Abs: 1320 cells/uL (ref 850–3900)
MCH: 28.8 pg (ref 27.0–33.0)
MCHC: 32.7 g/dL (ref 32.0–36.0)
MCV: 87.9 fL (ref 80.0–100.0)
MPV: 10.1 fL (ref 7.5–12.5)
Monocytes Relative: 6 %
Neutro Abs: 3669 cells/uL (ref 1500–7800)
Neutrophils Relative %: 66.7 %
Platelets: 232 10*3/uL (ref 140–400)
RBC: 4.38 10*6/uL (ref 4.20–5.80)
RDW: 14.1 % (ref 11.0–15.0)
Total Lymphocyte: 24 %
WBC: 5.5 10*3/uL (ref 3.8–10.8)

## 2020-07-08 LAB — HIV-1 RNA QUANT-NO REFLEX-BLD
HIV 1 RNA Quant: <20 copies/mL
HIV-1 RNA Quant, Log: <1.3 Log copies/mL

## 2020-07-08 LAB — RPR: RPR Ser Ql: REACTIVE — AB

## 2020-07-08 LAB — RPR TITER: RPR Titer: 1:4 {titer} — ABNORMAL HIGH

## 2020-07-08 LAB — FLUORESCENT TREPONEMAL AB(FTA)-IGG-BLD: Fluorescent Treponemal ABS: REACTIVE — AB

## 2020-07-21 ENCOUNTER — Other Ambulatory Visit: Payer: Self-pay

## 2020-07-21 ENCOUNTER — Ambulatory Visit (INDEPENDENT_AMBULATORY_CARE_PROVIDER_SITE_OTHER): Payer: Medicare Other | Admitting: Infectious Diseases

## 2020-07-21 ENCOUNTER — Encounter: Payer: Self-pay | Admitting: Infectious Diseases

## 2020-07-21 VITALS — BP 136/84 | HR 105 | Temp 98.8°F | Wt 243.0 lb

## 2020-07-21 DIAGNOSIS — L2081 Atopic neurodermatitis: Secondary | ICD-10-CM

## 2020-07-21 DIAGNOSIS — Z794 Long term (current) use of insulin: Secondary | ICD-10-CM

## 2020-07-21 DIAGNOSIS — B20 Human immunodeficiency virus [HIV] disease: Secondary | ICD-10-CM | POA: Diagnosis not present

## 2020-07-21 DIAGNOSIS — E119 Type 2 diabetes mellitus without complications: Secondary | ICD-10-CM

## 2020-07-21 MED ORDER — HYDROXYZINE HCL 25 MG PO TABS: 25.0000 mg | ORAL_TABLET | Freq: Every evening | ORAL | 11 refills | Status: DC | PRN

## 2020-07-21 MED ORDER — BIKTARVY 50-200-25 MG PO TABS: 1.0000 | ORAL_TABLET | Freq: Every day | ORAL | 11 refills | Status: DC

## 2020-07-21 NOTE — Assessment & Plan Note (Signed)
He is off the Metformin for now due to GI intolerance.  We discussed referral to endocrinology for consideration of continuous glucose monitor and management of medications.

## 2020-07-21 NOTE — Patient Instructions (Signed)
Nice to meet you  Will refill your hydroxyzine and biktarvy.   Please come back to see Dr. Daiva Eves in 1 year with labs prior to your visit - plan on making this lab appointment fasting (nothing but water for 8 hours before your blood draw).   [facility] over on [address] will be able to get you your COVID vaccine. I would be comfortable with both the Pfizer or Moderna for you.   I placed a referral for you to see Endocrinology to discuss the continuous glucose monitoring and how we can better get your diabetes under control.   Flu shot in October   You can do the shingles vaccine about 1 month after you complete the COVID vaccine. Don't mix them together. Let your immune system get one at a time.

## 2020-07-21 NOTE — Assessment & Plan Note (Signed)
Well controlled HIV on Biktarvy. Will provide refills today and have him back to see Dr. Daiva Eves in 1 year.  Counseled today re: COVID19 vaccine - recommended Moderna or Pfizer with blood clot history to be on the safe side.

## 2020-07-21 NOTE — Progress Notes (Signed)
Chief complaint: For HIV on medications Subjective:    Patient ID: Craig [name], male    DOB: 12/27/1960, 59 y.o.   MRN: [id number]   HPI Craig [name] is a 59 y.o. male with well controlled HIV on Biktarvy with other medical problems including eczema (on Dupixent), DVT with PE, dCHF, HTN, ?MI, prior AKI previously cared for at [facility].   Last office visit in January 2020 with Dr. Daiva Eves. He has been on Biktarvy once a day up until 13m ago when his rx ran out and he was asked to come in for an appointment. He has no problems with Biktarvy and is taking it correctly every day.   He is requesting help getting continuous glucose monitor for diabetes. Poking his finger is painful and a barrier to him monitoring. He cannot tolerate metformin due to GI side effects and is on new medications including insulin. He is not sure what his last a1C was.   Requesting refill for Hydroxyzine for itching at night d/t ezcema. He is now off the methotrexate and on dupixant with good effect.   RPR titer 1:16 62m ago --> treated with 28d Doxycycline after RPR did not sufficiently reduce 4-fold.    Past Medical History:  Diagnosis Date  . AIDS (HCC) 05/24/2015  . Allergic rhinitis 01/25/2016  . Atopic dermatitis 12/30/2017  . 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 Deep vein thrombosis (HCC) 05/09/2011   Overview:  Jan 2012   . Diabetes mellitus, controlled (HCC) 01/25/2016  . Dysplasia of anus   . Esophageal reflux   . 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 Hernia, inguinal 04/09/2013  . HIV (human immunodeficiency virus infection) (HCC)   . Horseshoe tear of retina without detachment 02-13-12  . 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  . Molluscum contagiosum 09-29-2012  . 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 infarction   . Other convulsions   . PE (pulmonary embolism) 04/09/2013   Overview:  Jan 2012   . Personal history of venous thrombosis and  embolism   . Secondary iridocyclitis, infectious   . Septic arthritis of wrist (HCC)    rt  . Shortness of breath dyspnea   . Unspecified hereditary and idiopathic peripheral neuropathy   . 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 Viral URI 03/10/2018    Past Surgical History:  Procedure Laterality Date  . COLONOSCOPY    . I & D EXTREMITY Right 07/28/2015   Procedure: IRRIGATION AND DEBRIDEMENT WRIST;  Surgeon: Knute Neu, MD;  Location: [facility] OR;  Service: Plastics;  Laterality: Right;     Social History   Socioeconomic History  . Marital status: Single    Spouse name: Not on file  . Number of children: Not on file  . Years of education: Not on file  . Highest education level: Not on file  Occupational History  . Not on file  Tobacco Use  . Smoking status: Never Smoker  . Smokeless tobacco: Never Used  Vaping Use  . Vaping Use: Never used  Substance and Sexual Activity  . Alcohol use: No    Alcohol/week: 0.0 standard drinks  . Drug use: No  . Sexual activity: Not Currently    Partners: Male    Birth control/protection: Condom  Other Topics Concern  . Not on file  Social History Narrative  . Not on file   Social Determinants of Health   Financial Resource Strain:   . Difficulty of Paying Living Expenses:  Food Insecurity:   . Worried About Programme researcher, broadcasting/film/video in the Last Year:   . Barista in the Last Year:   Transportation Needs:   . Freight forwarder (Medical):   Marland Kitchen Lack of Transportation (Non-Medical):   Physical Activity:   . Days of Exercise per Week:   . Minutes of Exercise per Session:   Stress:   . Feeling of Stress :   Social Connections:   . Frequency of Communication with Friends and Family:   . Frequency of Social Gatherings with Friends and Family:   . Attends Religious Services:   . Active Member of Clubs or Organizations:   . Attends Banker Meetings:   Marland Kitchen Marital Status:     Allergies  Allergen Reactions  .  Penicillins Hives and Rash    Has patient had a PCN reaction causing immediate rash, facial/tongue/throat swelling, SOB or lightheadedness with hypotension:YES Has patient had a PCN reaction causing Has patient had a PCN reaction that required hospitalization NO Has patient had a PCN reaction occurring within the last 10 years: YES If all of the above answers are "NO", then may proceed with Cephalosporin use.  . Shellfish Allergy Shortness Of Breath  . Latex Rash  . Metformin Nausea Only    Both IR and XR formulations     Current Outpatient Medications:  .  ACCU-CHEK AVIVA PLUS test strip, , Disp: , Rfl:  .  albuterol (PROVENTIL HFA;VENTOLIN HFA) 108 (90 BASE) MCG/ACT inhaler, Inhale 2 puffs into the lungs every 6 (six) hours as needed for wheezing or shortness of breath., Disp: , Rfl:  .  Alcohol Swabs (ALCOHOL PREP) 70 % PADS, , Disp: , Rfl:  .  ASSURE COMFORT LANCETS 30G MISC, , Disp: , Rfl:  .  atorvastatin (LIPITOR) 40 MG tablet, Take 40 mg by mouth daily., Disp: , Rfl:  .  colchicine 0.6 MG tablet, Take 1 tablet (0.6 mg total) by mouth daily., Disp: 2 tablet, Rfl: 0 .  Dextromethorphan-Guaifenesin (ROBITUSSIN DM PO), Take by mouth., Disp: , Rfl:  .  glipiZIDE (GLUCOTROL XL) 2.5 MG 24 hr tablet, Take 2.5 mg by mouth., Disp: , Rfl:  .  hydrOXYzine (ATARAX/VISTARIL) 25 MG tablet, Take 1 tablet (25 mg total) by mouth at bedtime as needed., Disp: 30 tablet, Rfl: 11 .  insulin lispro (HUMALOG) 100 UNIT/ML KwikPen, Inject into the skin., Disp: , Rfl:  .  lisinopril (PRINIVIL,ZESTRIL) 2.5 MG tablet, Take 2.5 mg by mouth daily., Disp: , Rfl:  .  Phenylephrine-Guaifenesin (MUCUS RELIEF D PO), Take by mouth., Disp: , Rfl:  .  Semaglutide (RYBELSUS) 14 MG TABS, Take by mouth., Disp: , Rfl:  .  sertraline (ZOLOFT) 50 MG tablet, Take by mouth., Disp: , Rfl:  .  warfarin (COUMADIN) 5 MG tablet, Take 5-7.5 mg by mouth daily. Take 1.5 (7.5mg ) daily except Sunday take 1 tab, Disp: , Rfl:  .   bictegravir-emtricitabine-tenofovir AF (BIKTARVY) 50-200-25 MG TABS tablet, Take 1 tablet by mouth daily., Disp: 30 tablet, Rfl: 11 .  DUPIXENT 300 MG/2ML prefilled syringe, , Disp: , Rfl:  .  HYDROcodone-homatropine (HYCODAN) 5-1.5 MG/5ML syrup, Take 5 mLs by mouth every 6 (six) hours as needed for cough. (Patient not taking: Reported on 07/21/2020), Disp: 60 mL, Rfl: 0 .  LANTUS SOLOSTAR 100 UNIT/ML Solostar Pen, Inject into the skin., Disp: , Rfl:  .  RYBELSUS 3 MG TABS, Take 1 tablet by mouth daily., Disp: , Rfl:  .  terbinafine (LAMISIL) 250 MG tablet, , Disp: , Rfl:  .  triamcinolone ointment (KENALOG) 0.1 %, Apply topically 2 (two) times daily., Disp: , Rfl:      Review of Systems  Constitutional: Negative for appetite change, chills, fatigue, fever and unexpected weight change.  Eyes: Negative for visual disturbance.  Respiratory: Negative for cough and shortness of breath.   Cardiovascular: Negative for chest pain and leg swelling.  Gastrointestinal: Negative for abdominal pain, diarrhea and nausea.  Genitourinary: Negative for discharge, dysuria and genital sores.  Musculoskeletal: Positive for myalgias. Negative for joint swelling.  Skin: Positive for rash. Negative for color change.  Neurological: Negative for dizziness and headaches.  Hematological: Negative for adenopathy.  Psychiatric/Behavioral: Negative for sleep disturbance. The patient is not nervous/anxious.        Objective:   Physical Exam Vitals and nursing note reviewed.  Constitutional:      General: He is not in acute distress.    Appearance: He is well-developed. He is not diaphoretic.  HENT:     Head: Normocephalic and atraumatic.     Mouth/Throat:     Pharynx: No oropharyngeal exudate.  Eyes:     General: No scleral icterus.    Conjunctiva/sclera: Conjunctivae normal.  Neck:     Vascular: No JVD.  Cardiovascular:     Rate and Rhythm: Normal rate and regular rhythm.  Pulmonary:     Effort:  Pulmonary effort is normal. No respiratory distress.     Breath sounds: Normal breath sounds.  Abdominal:     General: There is no distension.     Palpations: Abdomen is soft.     Tenderness: There is no guarding.  Musculoskeletal:        General: No tenderness.     Cervical back: Normal range of motion and neck supple.  Lymphadenopathy:     Cervical: No cervical adenopathy.  Skin:    General: Skin is warm and dry.     Coloration: Skin is not pale.     Findings: No erythema.  Neurological:     Mental Status: He is alert and oriented to person, place, and time.     Motor: No abnormal muscle tone.     Coordination: Coordination normal.  Psychiatric:        Behavior: Behavior normal.        Thought Content: Thought content normal.        Judgment: Judgment normal.      Assessment & Plan:   Problem List Items Addressed This Visit      Unprioritized   AIDS (HCC)   Relevant Medications   bictegravir-emtricitabine-tenofovir AF (BIKTARVY) 50-200-25 MG TABS tablet   HIV infection (HCC) (Chronic)    Well controlled HIV on Biktarvy. Will provide refills today and have him back to see Dr. Daiva Eves in 1 year.  Counseled today re: COVID19 vaccine - recommended Moderna or Pfizer with blood clot history to be on the safe side.       Relevant Medications   bictegravir-emtricitabine-tenofovir AF (BIKTARVY) 50-200-25 MG TABS tablet   Other Relevant Orders   CBC   T-helper cell (CD4)- ([facility] clinic only)   Comprehensive metabolic panel   Lipid panel   RPR   HIV-1 RNA quant-no reflex-bld   Type 2 diabetes mellitus without complication, with long-term current use of insulin (HCC) - Primary    He is off the Metformin for now due to GI intolerance.  We discussed referral to endocrinology for consideration of continuous glucose  monitor and management of medications.       Relevant Medications   LANTUS SOLOSTAR 100 UNIT/ML Solostar Pen   insulin lispro (HUMALOG) 100 UNIT/ML KwikPen    RYBELSUS 3 MG TABS   Semaglutide (RYBELSUS) 14 MG TABS   Other Relevant Orders   Ambulatory referral to Endocrinology   Atopic dermatitis    Off methotrexate and now on Dupixent. Under better control. Just has trouble with constantly dry skin. [facility]          Rexene AlbertsStephanie Gerry Heaphy, MSN, NP-C [facility]  [email or web address] Pager: [phone] Office: [phone] [facility] Main Line: [phone]

## 2020-07-21 NOTE — Assessment & Plan Note (Signed)
Off methotrexate and now on Dupixent. Under better control. Just has trouble with constantly dry skin. Sees [facility] provider for this.

## 2020-07-30 ENCOUNTER — Ambulatory Visit (INDEPENDENT_AMBULATORY_CARE_PROVIDER_SITE_OTHER): Payer: Medicare Other

## 2020-07-30 ENCOUNTER — Other Ambulatory Visit: Payer: Self-pay

## 2020-07-30 ENCOUNTER — Encounter (HOSPITAL_COMMUNITY): Payer: Self-pay | Admitting: Emergency Medicine

## 2020-07-30 ENCOUNTER — Ambulatory Visit (HOSPITAL_COMMUNITY)
Admission: EM | Admit: 2020-07-30 | Discharge: 2020-07-30 | Disposition: A | Discharge status: Home / Self Care | Payer: Medicare Other | Attending: Emergency Medicine | Admitting: Emergency Medicine

## 2020-07-30 DIAGNOSIS — M25571 Pain in right ankle and joints of right foot: Secondary | ICD-10-CM

## 2020-07-30 DIAGNOSIS — M25471 Effusion, right ankle: Secondary | ICD-10-CM

## 2020-07-30 MED ORDER — PREDNISONE 20 MG PO TABS
40.0000 mg | ORAL_TABLET | Freq: Every day | ORAL | 0 refills | Status: AC
Start: 2020-07-30 — End: 2020-08-04

## 2020-07-30 MED ORDER — DOXYCYCLINE HYCLATE 100 MG CAPSULE
ORAL | 0 days
Start: 2020-07-30 — End: ?

## 2020-07-30 MED ORDER — PREDNISONE 20 MG TABLET
ORAL | 0.00000 days
Start: 2020-07-30 — End: 2020-08-04

## 2020-07-30 MED ORDER — DOXYCYCLINE HYCLATE 100 MG PO CAPS
100.0000 mg | ORAL_CAPSULE | Freq: Two times a day (BID) | ORAL | 0 refills | Status: DC
Start: 2020-07-30 — End: 2020-09-28

## 2020-07-30 NOTE — ED Triage Notes (Addendum)
Foot swelling for a week.  No known injury.  Pain with walking.

## 2020-07-30 NOTE — Discharge Instructions (Signed)
I am concerned about gout as well as infection, so at this time I am going to treat for both of these with antibiotics as well as with steroids.  Please follow up with orthopedics or sports medicine if symptoms persist Please return if any worsening- fevers, increased pain or swelling.

## 2020-07-30 NOTE — ED Provider Notes (Signed)
[facility]    CSN: [id number] Arrival date & time: 07/30/20  1624      History   Chief Complaint Chief Complaint  Patient presents with  . Foot Pain    HPI Craig [name] is a 59 y.o. male.   Craig [name] presents with complaints of swelling to right foot. Painful. Started approximately 2-3 weeks ago. DM, with neuropathy. Red spots to skin, worse when hot. History of eczema. No injury to the foot. History of gout, but this doesn't necessarily feel similar. No fevers. No necessarily on feet a lot. History of blood clots- dvt and lungs. Takes coumadin, INR has been in range, rechecks INR in two days.   ROS per HPI, negative if not otherwise mentioned.      Past Medical History:  Diagnosis Date  . AIDS (HCC) 05/24/2015  . Allergic rhinitis 01/25/2016  . Atopic dermatitis 12/30/2017  . 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 Deep vein thrombosis (HCC) 05/09/2011   Overview:  Jan 2012   . Diabetes mellitus, controlled (HCC) 01/25/2016  . Dysplasia of anus   . Esophageal reflux   . 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 Hernia, inguinal 04/09/2013  . HIV (human immunodeficiency virus infection) (HCC)   . Horseshoe tear of retina without detachment 02-13-12  . 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  . Molluscum contagiosum 09-29-2012  . 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 infarction   . Other convulsions   . PE (pulmonary embolism) 04/09/2013   Overview:  Jan 2012   . Personal history of venous thrombosis and embolism   . Secondary iridocyclitis, infectious   . Septic arthritis of wrist (HCC)    rt  . Shortness of breath dyspnea   . Unspecified hereditary and idiopathic peripheral neuropathy   . 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 Viral URI 03/10/2018    Patient Active Problem List   Diagnosis Date Noted  . Herpes zoster without complication 07/22/2018  . Atopic dermatitis 12/30/2017  . Obesity  (BMI 30.0-34.9) 08/22/2017  . Diastasis recti 01/24/2017  . Type 2 diabetes mellitus without complication, with long-term current use of insulin (HCC) 01/25/2016  . Allergic rhinitis 01/25/2016  . Elevated INR   . Gout of right knee 07/28/2015  . HIV disease (HCC) 05/24/2015  . AIDS (HCC) 05/24/2015  . Iatrogenic hypotension 11/22/2014  . HIV infection (HCC) 03/08/2014  . History of pulmonary embolus (PE) 03/08/2014  . History of DVT (deep vein thrombosis) 03/08/2014  . H/O deep venous thrombosis 03/08/2014  . 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 H/O thrombosis 03/08/2014  . Long term current use of anticoagulant therapy 02/26/2014  . Coronary atherosclerosis of unspecified type of vessel, native or graft 02/26/2014  . Dermatitis 02/26/2014  . Depressive disorder, not elsewhere classified 02/26/2014  . Asthma, chronic 02/26/2014  . Coronary atherosclerosis 02/26/2014  . Clinical depression 02/26/2014  . Chronic diastolic heart failure (HCC) 04/09/2013  . Esophageal reflux 04/09/2013  . Essential hypertension 04/09/2013  . Gout 04/09/2013  . 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 Chronic pulmonary heart disease (HCC) 04/09/2013  . Congestive heart failure (HCC) 04/09/2013  . Convulsions (HCC) 04/09/2013  . Diastolic heart failure (HCC) 04/09/2013  . History of anticoagulant therapy 04/09/2013  . Pulmonary embolism (HCC) 04/09/2013  . Allergy to latex 04/09/2013  . Inguinal hernia 04/09/2013  . 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 Dysplasia of anus 12/29/2012  . Molluscum contagiosum 09/29/2012  . 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  Horseshoe tear of retina 02/13/2012  . Deep vein thrombosis (DVT) (HCC) 05/09/2011  . Airway hyperreactivity 09/08/2010  . Hereditary and idiopathic peripheral neuropathy 09/08/2010  . Human immunodeficiency virus (HIV) disease (HCC) 12/14/2009    Past Surgical History:  Procedure Laterality Date  . COLONOSCOPY    . I & D  EXTREMITY Right 07/28/2015   Procedure: IRRIGATION AND DEBRIDEMENT WRIST;  Surgeon: Knute Neu, MD;  Location: [facility] OR;  Service: Plastics;  Laterality: Right;       Home Medications    Prior to Admission medications   Medication Sig Start Date End Date Taking? Authorizing Provider  ACCU-CHEK AVIVA PLUS test strip  07/16/17   [provider]  albuterol (PROVENTIL HFA;VENTOLIN HFA) 108 (90 BASE) MCG/ACT inhaler Inhale 2 puffs into the lungs every 6 (six) hours as needed for wheezing or shortness of breath.    [provider]  Alcohol Swabs (ALCOHOL PREP) 70 % PADS  07/16/17   [provider]  ASSURE COMFORT LANCETS 30G MISC  07/16/17   [provider]  atorvastatin (LIPITOR) 40 MG tablet Take 40 mg by mouth daily.    [provider]  bictegravir-emtricitabine-tenofovir AF (BIKTARVY) 50-200-25 MG TABS tablet Take 1 tablet by mouth daily. 07/21/20   Blanchard Kelch, NP  colchicine 0.6 MG tablet Take 1 tablet (0.6 mg total) by mouth daily. 05/22/18   Glynn Octave, MD  Dextromethorphan-Guaifenesin (ROBITUSSIN DM PO) Take by mouth.    [provider]  doxycycline (VIBRAMYCIN) 100 MG capsule Take 1 capsule (100 mg total) by mouth 2 (two) times daily. 07/30/20   Georgetta Haber, NP  DUPIXENT 300 MG/2ML prefilled syringe  03/28/20   [provider]  glipiZIDE (GLUCOTROL XL) 2.5 MG 24 hr tablet Take 2.5 mg by mouth. 12/22/15   [provider]  HYDROcodone-homatropine (HYCODAN) 5-1.5 MG/5ML syrup Take 5 mLs by mouth every 6 (six) hours as needed for cough. Patient not taking: Reported on 07/21/2020 12/09/17   Mardella Layman, MD  hydrOXYzine (ATARAX/VISTARIL) 25 MG tablet Take 1 tablet (25 mg total) by mouth at bedtime as needed. 07/21/20   Blanchard Kelch, NP  insulin lispro (HUMALOG) 100 UNIT/ML KwikPen Inject into the skin. 06/09/20   [provider]  LANTUS SOLOSTAR 100 UNIT/ML Solostar Pen Inject into the skin. 06/09/20    [provider]  lisinopril (PRINIVIL,ZESTRIL) 2.5 MG tablet Take 2.5 mg by mouth daily.    [provider]  Phenylephrine-Guaifenesin (MUCUS RELIEF D PO) Take by mouth.    [provider]  predniSONE (DELTASONE) 20 MG tablet Take 2 tablets (40 mg total) by mouth daily with breakfast for 5 days. 07/30/20 08/04/20  Georgetta Haber, NP  RYBELSUS 3 MG TABS Take 1 tablet by mouth daily. 04/14/20   [provider]  Semaglutide (RYBELSUS) 14 MG TABS Take by mouth. 06/30/20   [provider]  sertraline (ZOLOFT) 50 MG tablet Take by mouth. 03/15/20 03/15/21  [provider]  terbinafine (LAMISIL) 250 MG tablet  08/28/17   [provider]  triamcinolone ointment (KENALOG) 0.1 % Apply topically 2 (two) times daily. 07/04/20   [provider]  warfarin (COUMADIN) 5 MG tablet Take 5-7.5 mg by mouth daily. Take 1.5 (7.5mg ) daily except Sunday take 1 tab    [provider]    Family History History reviewed. No pertinent family history.  Social History Social History   Tobacco Use  . Smoking status: Never Smoker  . Smokeless tobacco: Never  Used  Vaping Use  . Vaping Use: Never used  Substance Use Topics  . Alcohol use: No    Alcohol/week: 0.0 standard drinks  . Drug use: No     Allergies   Penicillins, Shellfish allergy, Latex, and Metformin   Review of Systems Review of Systems   Physical Exam Triage Vital Signs ED Triage Vitals  Enc Vitals Group     BP 07/30/20 1728 (!) 152/91     Pulse Rate 07/30/20 1728 (!) 101     Resp 07/30/20 1728 20     Temp 07/30/20 1728 98.4 F (36.9 C)     Temp Source 07/30/20 1728 Oral     SpO2 07/30/20 1728 100 %     Weight --      Height --      Head Circumference --      Peak Flow --      Pain Score 07/30/20 1726 8     Pain Loc --      Pain Edu? --      Excl. in GC? --    No data found.  Updated Vital Signs BP (!) 152/91 (BP Location: Right Arm)   Pulse (!) 101    Temp 98.4 F (36.9 C) (Oral)   Resp 20   SpO2 100%   Visual Acuity Right Eye Distance:   Left Eye Distance:   Bilateral Distance:    Right Eye Near:   Left Eye Near:    Bilateral Near:     Physical Exam Constitutional:      Appearance: He is well-developed.  Cardiovascular:     Rate and Rhythm: Normal rate.  Pulmonary:     Effort: Pulmonary effort is normal.  Musculoskeletal:       Legs:     Comments: Redness swelling and warmth to right foot, primarily at right anterior ankle, with tenderness; strong pedal pulse; pain with ROM of ankle   Skin:    General: Skin is warm and dry.  Neurological:     Mental Status: He is alert and oriented to person, place, and time.      UC Treatments / Results  Labs (all labs ordered are listed, but only abnormal results are displayed) Labs Reviewed - No data to display  EKG   Radiology DG Ankle Complete Right  Result Date: 07/30/2020 CLINICAL DATA:  Pain and swelling of the RIGHT ankle for 3 weeks. No previous fracture or injury. EXAM: RIGHT ANKLE - COMPLETE 3+ VIEW COMPARISON:  None. FINDINGS: No acute fracture or subluxation. Small plantar and Achilles spurs are present. Soft tissues are unremarkable. IMPRESSION: Negative. Electronically Signed   By: Norva Pavlov M.D.   On: 07/30/2020 18:25    Procedures Procedures (including critical care time)  Medications Ordered in UC Medications - No data to display  Initial Impression / Assessment and Plan / UC Course  I have reviewed the triage vital signs and the nursing notes.  Pertinent labs & imaging results that were available during my care of the patient were reviewed by me and considered in my medical decision making (see chart for details).     Per chart review patient has had similar incidences with other joints as well, gout? Prednisone as well doxycycline provided today, with consideration also of cellulitis to the foot. Return precautions provided. On coumadin, low  suspicion for DVT. Patient verbalized understanding and agreeable to plan.   Final Clinical Impressions(s) / UC Diagnoses   Final diagnoses:  Right ankle swelling  Discharge Instructions     I am concerned about gout as well as infection, so at this time I am going to treat for both of these with antibiotics as well as with steroids.  Please follow up with orthopedics or sports medicine if symptoms persist Please return if any worsening- fevers, increased pain or swelling.     ED Prescriptions    Medication Sig Dispense Auth. Provider   doxycycline (VIBRAMYCIN) 100 MG capsule Take 1 capsule (100 mg total) by mouth 2 (two) times daily. 20 capsule Linus MakoBurky, Andreka Stucki B, NP   predniSONE (DELTASONE) 20 MG tablet Take 2 tablets (40 mg total) by mouth daily with breakfast for 5 days. 10 tablet Georgetta HaberBurky, Bettymae Yott B, NP     PDMP not reviewed this encounter.   Georgetta HaberBurky, Kaydenn Mclear B, NP 07/31/20 [phone]

## 2020-08-01 ENCOUNTER — Encounter: Admit: 2020-08-01 | Discharge: 2020-08-02 | Payer: MEDICARE

## 2020-08-01 DIAGNOSIS — E1365 Other specified diabetes mellitus with hyperglycemia: Principal | ICD-10-CM

## 2020-08-01 DIAGNOSIS — L309 Dermatitis, unspecified: Principal | ICD-10-CM

## 2020-08-01 DIAGNOSIS — I2699 Other pulmonary embolism without acute cor pulmonale: Principal | ICD-10-CM

## 2020-08-15 ENCOUNTER — Ambulatory Visit: Admit: 2020-08-15 | Discharge: 2020-08-16 | Payer: MEDICARE

## 2020-08-15 DIAGNOSIS — L309 Dermatitis, unspecified: Principal | ICD-10-CM

## 2020-08-15 DIAGNOSIS — L2084 Intrinsic (allergic) eczema: Principal | ICD-10-CM

## 2020-08-15 DIAGNOSIS — L209 Atopic dermatitis, unspecified: Principal | ICD-10-CM

## 2020-08-15 MED ORDER — TRIAMCINOLONE ACETONIDE 0.1 % TOPICAL OINTMENT
2 refills | 0 days | Status: CP
Start: 2020-08-15 — End: ?

## 2020-08-15 MED ORDER — ALCLOMETASONE 0.05 % TOPICAL OINTMENT
Freq: Two times a day (BID) | TOPICAL | 3 refills | 0 days | Status: CP
Start: 2020-08-15 — End: 2021-08-15

## 2020-08-15 MED ORDER — DUPILUMAB 300 MG/2 ML SUBCUTANEOUS SYRINGE
SUBCUTANEOUS | 5 refills | 28 days | Status: CP
Start: 2020-08-15 — End: ?

## 2020-08-15 MED ORDER — CLOBETASOL 0.05 % TOPICAL OINTMENT
Freq: Two times a day (BID) | TOPICAL | 1 refills | 0 days | Status: CP
Start: 2020-08-15 — End: 2021-08-15

## 2020-08-23 ENCOUNTER — Other Ambulatory Visit: Payer: Medicare Other

## 2020-09-01 ENCOUNTER — Encounter: Admit: 2020-09-01 | Payer: MEDICARE

## 2020-09-06 ENCOUNTER — Encounter: Payer: Medicare Other | Admitting: Infectious Disease

## 2020-09-06 NOTE — Progress Notes (Signed)
This encounter was created in error - please disregard.

## 2020-09-15 ENCOUNTER — Ambulatory Visit: Admit: 2020-09-15 | Discharge: 2020-09-16 | Payer: MEDICARE

## 2020-09-15 DIAGNOSIS — I2699 Other pulmonary embolism without acute cor pulmonale: Principal | ICD-10-CM

## 2020-09-15 MED ORDER — LISINOPRIL 2.5 MG TABLET
ORAL_TABLET | Freq: Every day | ORAL | 3 refills | 90.00000 days | Status: CP
Start: 2020-09-15 — End: ?

## 2020-09-15 MED ORDER — INSULIN LISPRO (U-100) 100 UNIT/ML SUBCUTANEOUS PEN
prn refills | 0.00000 days
Start: 2020-09-15 — End: ?

## 2020-09-28 ENCOUNTER — Ambulatory Visit (HOSPITAL_COMMUNITY)
Admission: EM | Admit: 2020-09-28 | Discharge: 2020-09-28 | Disposition: A | Discharge status: Home / Self Care | Payer: Medicare Other | Attending: Family Medicine | Admitting: Family Medicine

## 2020-09-28 ENCOUNTER — Encounter (HOSPITAL_COMMUNITY): Payer: Self-pay

## 2020-09-28 ENCOUNTER — Other Ambulatory Visit: Payer: Self-pay

## 2020-09-28 DIAGNOSIS — M109 Gout, unspecified: Secondary | ICD-10-CM

## 2020-09-28 MED ORDER — PREDNISONE 20 MG PO TABS: 20.0000 mg | ORAL_TABLET | Freq: Two times a day (BID) | ORAL | 0 refills | Status: DC

## 2020-09-28 MED ORDER — TRAMADOL HCL 50 MG PO TABS: 50.0000 mg | ORAL_TABLET | Freq: Four times a day (QID) | ORAL | 0 refills | Status: DC | PRN

## 2020-09-28 NOTE — Discharge Instructions (Addendum)
Take prednisone 2 times a day for 5 days Be careful with your carbohydrate intake and diabetes management while you are on prednisone Take tramadol as needed for severe pain Stay off of your feet and elevate for at least 2 to 3 days Read information on foods that cause gout to flare Follow-up with your primary care doctor

## 2020-09-28 NOTE — ED Triage Notes (Signed)
Pt present left leg swelling and pain, on Friday pt was moving the dresser and hit his knee on the side of the dresser and since then the knee/leg has been sensitive and sore with swelling.

## 2020-09-28 NOTE — ED Provider Notes (Signed)
[facility]    CSN: [id number] Arrival date & time: 09/28/20  1515      History   Chief Complaint Chief Complaint  Patient presents with  . Leg Pain    Left leg    HPI Craig [name] is a 59 y.o. male.   HPI  Patient states he injured his knee on Friday.  He bumped it.  He did not think that it was terribly hurt.  The next day it was very swollen, warm, and painful.  It hurts with even light touch.  He states it feels like gout. He is on longstanding warfarin because of a history of pulmonary embolus and DVT.  He did not have any wound or bleeding. He has diabetes.  He states his diabetes is well controlled.  Sugar this morning is 116. Patient is under the care of infectious disease for HIV disease.  His viral titers are nondetectable.  He is doing well. Patient is on multiple medications.  He is compliant with his medical care.  Past Medical History:  Diagnosis Date  . AIDS (HCC) 05/24/2015  . Allergic rhinitis 01/25/2016  . Atopic dermatitis 12/30/2017  . 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 Deep vein thrombosis (HCC) 05/09/2011   Overview:  Jan 2012   . Diabetes mellitus, controlled (HCC) 01/25/2016  . Dysplasia of anus   . Esophageal reflux   . 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 Hernia, inguinal 04/09/2013  . HIV (human immunodeficiency virus infection) (HCC)   . Horseshoe tear of retina without detachment 02-13-12  . 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  . Molluscum contagiosum 09-29-2012  . 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 infarction   . Other convulsions   . PE (pulmonary embolism) 04/09/2013   Overview:  Jan 2012   . Personal history of venous thrombosis and embolism   . Secondary iridocyclitis, infectious   . Septic arthritis of wrist (HCC)    rt  . Shortness of breath dyspnea   . Unspecified hereditary and idiopathic peripheral neuropathy   . 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 Viral URI 03/10/2018    Patient Active Problem List   Diagnosis Date Noted  . Herpes zoster without complication 07/22/2018  . Atopic dermatitis 12/30/2017  . Obesity (BMI 30.0-34.9) 08/22/2017  . Diastasis recti 01/24/2017  . Type 2 diabetes mellitus without complication, with long-term current use of insulin (HCC) 01/25/2016  . Allergic rhinitis 01/25/2016  . Elevated INR   . HIV infection (HCC) 03/08/2014  . History of pulmonary embolus (PE) 03/08/2014  . History of DVT (deep vein thrombosis) 03/08/2014  . Long term current use of anticoagulant therapy 02/26/2014  . Coronary atherosclerosis of unspecified type of vessel, native or graft 02/26/2014  . Dermatitis 02/26/2014  . Depressive disorder, not elsewhere classified 02/26/2014  . Asthma, chronic 02/26/2014  . Coronary atherosclerosis 02/26/2014  . Clinical depression 02/26/2014  . Chronic diastolic heart failure (HCC) 04/09/2013  . Esophageal reflux 04/09/2013  . Essential hypertension 04/09/2013  . Gout 04/09/2013  . 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 Chronic pulmonary heart disease (HCC) 04/09/2013  . Congestive heart failure (HCC) 04/09/2013  . Convulsions (HCC) 04/09/2013  . Diastolic heart failure (HCC) 04/09/2013  . Allergy to latex 04/09/2013  . Inguinal hernia 04/09/2013  . 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 Dysplasia of anus 12/29/2012  . Molluscum contagiosum 09/29/2012  . 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 Horseshoe tear of retina 02/13/2012  . Airway hyperreactivity 09/08/2010  .  Hereditary and idiopathic peripheral neuropathy 09/08/2010    Past Surgical History:  Procedure Laterality Date  . COLONOSCOPY    . I & D EXTREMITY Right 07/28/2015   Procedure: IRRIGATION AND DEBRIDEMENT WRIST;  Surgeon: Knute Neu, MD;  Location: [facility];  Service: Plastics;  Laterality: Right;       Home Medications    Prior to Admission medications   Medication Sig Start Date End Date Taking?  Authorizing Provider  ACCU-CHEK AVIVA PLUS test strip  07/16/17   [provider]  albuterol (PROVENTIL HFA;VENTOLIN HFA) 108 (90 BASE) MCG/ACT inhaler Inhale 2 puffs into the lungs every 6 (six) hours as needed for wheezing or shortness of breath.    [provider]  Alcohol Swabs (ALCOHOL PREP) 70 % PADS  07/16/17   [provider]  ASSURE COMFORT LANCETS 30G MISC  07/16/17   [provider]  atorvastatin (LIPITOR) 40 MG tablet Take 40 mg by mouth daily.    [provider]  bictegravir-emtricitabine-tenofovir AF (BIKTARVY) 50-200-25 MG TABS tablet Take 1 tablet by mouth daily. 07/21/20   Blanchard Kelch, NP  colchicine 0.6 MG tablet Take 1 tablet (0.6 mg total) by mouth daily. 05/22/18   Glynn Octave, MD  DUPIXENT 300 MG/2ML prefilled syringe  03/28/20   [provider]  glipiZIDE (GLUCOTROL XL) 2.5 MG 24 hr tablet Take 2.5 mg by mouth. 12/22/15   [provider]  hydrOXYzine (ATARAX/VISTARIL) 25 MG tablet Take 1 tablet (25 mg total) by mouth at bedtime as needed. 07/21/20   Blanchard Kelch, NP  insulin lispro (HUMALOG) 100 UNIT/ML KwikPen Inject into the skin. 06/09/20   [provider]  LANTUS SOLOSTAR 100 UNIT/ML Solostar Pen Inject into the skin. 06/09/20   [provider]  lisinopril (PRINIVIL,ZESTRIL) 2.5 MG tablet Take 2.5 mg by mouth daily.    [provider]  predniSONE (DELTASONE) 20 MG tablet Take 1 tablet (20 mg total) by mouth 2 (two) times daily with a meal. 09/28/20   Eustace Moore, MD  Semaglutide (RYBELSUS) 14 MG TABS Take by mouth. 06/30/20   [provider]  sertraline (ZOLOFT) 50 MG tablet Take by mouth. 03/15/20 03/15/21  [provider]  traMADol (ULTRAM) 50 MG tablet Take 1 tablet (50 mg total) by mouth every 6 (six) hours as needed. 09/28/20   Eustace Moore, MD  triamcinolone ointment (KENALOG) 0.1 % Apply topically 2 (two) times daily. 07/04/20   [provider]  warfarin (COUMADIN) 5 MG tablet Take 5-7.5 mg by mouth daily. Take 1.5 (7.5mg ) daily except Sunday take 1 tab    [provider]    Family History History reviewed. No pertinent family history.  Social History Social History   Tobacco Use  . Smoking status: Never Smoker  . Smokeless tobacco: Never Used  Vaping Use  . Vaping Use: Never used  Substance Use Topics  . Alcohol use: No    Alcohol/week: 0.0 standard drinks  . Drug use: No     Allergies   Penicillins, Shellfish allergy, Latex, and Metformin   Review of Systems Review of Systems See HPI  Physical Exam Triage Vital Signs ED Triage Vitals  Enc Vitals Group     BP 09/28/20 1730 (!) 146/88     Pulse Rate 09/28/20 1730 (!) 108     Resp 09/28/20 1730 20     Temp 09/28/20 1730 98.7 F (37.1 C)     Temp Source 09/28/20 1730 Oral  SpO2 09/28/20 1730 100 %     Weight --      Height --      Head Circumference --      Peak Flow --      Pain Score 09/28/20 1731 10     Pain Loc --      Pain Edu? --      Excl. in GC? --    No data found.  Updated Vital Signs BP (!) 146/88 (BP Location: Right Arm)   Pulse (!) 108   Temp 98.7 F (37.1 C) (Oral)   Resp 20   SpO2 100%       Physical Exam Constitutional:      General: He is not in acute distress.    Appearance: He is well-developed.     Comments: Antalgic gait.  Keeps left leg stiff  HENT:     Head: Normocephalic and atraumatic.  Eyes:     Conjunctiva/sclera: Conjunctivae normal.     Pupils: Pupils are equal, round, and reactive to light.  Cardiovascular:     Rate and Rhythm: Normal rate and regular rhythm.  Pulmonary:     Effort: Pulmonary effort is normal. No respiratory distress.  Abdominal:     Palpations: Abdomen is soft.  Musculoskeletal:        General: Normal range of motion.     Cervical back: Normal range of motion.     Comments: Right knee has faint erythema.  Trace effusion.  Tenderness to even light  palpation.  Resistance to movement of the joint.  No soft tissue swelling of the distal leg.  No tenderness in the calf.  Skin:    General: Skin is warm and dry.  Neurological:     Mental Status: He is alert.  Psychiatric:        Mood and Affect: Mood normal.        Behavior: Behavior normal.      UC Treatments / Results  Labs (all labs ordered are listed, but only abnormal results are displayed) Labs Reviewed - No data to display  EKG   Radiology No results found.  Procedures Procedures (including critical care time)  Medications Ordered in UC Medications - No data to display  Initial Impression / Assessment and Plan / UC Course  I have reviewed the triage vital signs and the nursing notes.  Pertinent labs & imaging results that were available during my care of the patient were reviewed by me and considered in my medical decision making (see chart for details).     Likely gout.  Since its been going on for several days I do not feel like colchicine is appropriate.  We will give him 5 days of prednisone.  Cautioned him to watch his sugars while on prednisone.  Tramadol for pain.  Call primary care if not improved in a few days  Final Clinical Impressions(s) / UC Diagnoses   Final diagnoses:  Acute gout of left knee, unspecified cause     Discharge Instructions     Take prednisone 2 times a day for 5 days Be careful with your carbohydrate intake and diabetes management while you are on prednisone Take tramadol as needed for severe pain Stay off of your feet and elevate for at least 2 to 3 days Read information on foods that cause gout to flare Follow-up with your primary care doctor   ED Prescriptions    Medication Sig Dispense Auth. Provider   predniSONE (DELTASONE) 20 MG tablet Take  1 tablet (20 mg total) by mouth 2 (two) times daily with a meal. 10 tablet Eustace Moore, MD   traMADol (ULTRAM) 50 MG tablet Take 1 tablet (50 mg total) by mouth every 6  (six) hours as needed. 15 tablet Eustace Moore, MD     I have reviewed the PDMP during this encounter.   Eustace Moore, MD 09/28/20 [id number]

## 2020-09-29 MED ORDER — OMEPRAZOLE 40 MG CAPSULE,DELAYED RELEASE
ORAL_CAPSULE | 0 refills | 0 days
Start: 2020-09-29 — End: ?

## 2020-10-05 MED ORDER — OMEPRAZOLE 40 MG CAPSULE,DELAYED RELEASE
ORAL_CAPSULE | Freq: Every day | ORAL | 0 refills | 0.00000 days | Status: CP
Start: 2020-10-05 — End: ?

## 2020-10-05 MED ORDER — OMEPRAZOLE 40 MG CAPSULE,DELAYED RELEASE: 40 mg | capsule | Freq: Every day | 0 refills | 30 days | Status: AC

## 2020-10-13 ENCOUNTER — Ambulatory Visit: Admit: 2020-10-13 | Discharge: 2020-10-13 | Payer: MEDICARE

## 2020-10-13 ENCOUNTER — Encounter: Admit: 2020-10-13 | Discharge: 2020-10-13 | Payer: MEDICARE

## 2020-10-13 DIAGNOSIS — K591 Functional diarrhea: Principal | ICD-10-CM

## 2020-10-13 DIAGNOSIS — R195 Other fecal abnormalities: Principal | ICD-10-CM

## 2020-10-13 DIAGNOSIS — R197 Diarrhea, unspecified: Principal | ICD-10-CM

## 2020-10-13 DIAGNOSIS — I2699 Other pulmonary embolism without acute cor pulmonale: Principal | ICD-10-CM

## 2020-10-27 DIAGNOSIS — E785 Hyperlipidemia, unspecified: Secondary | ICD-10-CM | POA: Insufficient documentation

## 2020-10-27 DIAGNOSIS — I2510 Atherosclerotic heart disease of native coronary artery without angina pectoris: Principal | ICD-10-CM

## 2020-10-27 MED ORDER — ATORVASTATIN 40 MG TABLET
ORAL_TABLET | Freq: Every day | ORAL | 3 refills | 90 days | Status: CP
Start: 2020-10-27 — End: 2021-10-27

## 2020-10-28 ENCOUNTER — Encounter: Admit: 2020-10-28 | Discharge: 2020-10-28 | Payer: PRIVATE HEALTH INSURANCE

## 2020-10-28 DIAGNOSIS — I5032 Chronic diastolic (congestive) heart failure: Principal | ICD-10-CM

## 2020-10-28 DIAGNOSIS — I2510 Atherosclerotic heart disease of native coronary artery without angina pectoris: Principal | ICD-10-CM

## 2020-10-28 DIAGNOSIS — L309 Dermatitis, unspecified: Principal | ICD-10-CM

## 2020-10-28 DIAGNOSIS — F32A Depression, unspecified depression type: Principal | ICD-10-CM

## 2020-10-28 DIAGNOSIS — E1365 Other specified diabetes mellitus with hyperglycemia: Principal | ICD-10-CM

## 2020-10-28 DIAGNOSIS — I2699 Other pulmonary embolism without acute cor pulmonale: Principal | ICD-10-CM

## 2020-10-28 DIAGNOSIS — R197 Diarrhea, unspecified: Principal | ICD-10-CM

## 2020-10-28 DIAGNOSIS — E1165 Type 2 diabetes mellitus with hyperglycemia: Principal | ICD-10-CM

## 2020-10-28 DIAGNOSIS — I10 Essential (primary) hypertension: Principal | ICD-10-CM

## 2020-10-28 DIAGNOSIS — E119 Type 2 diabetes mellitus without complications: Principal | ICD-10-CM

## 2020-10-28 MED ORDER — BLOOD GLUCOSE TEST STRIPS
ORAL_STRIP | 11 refills | 0 days | Status: CP
Start: 2020-10-28 — End: 2021-10-28

## 2020-10-28 MED ORDER — PEN NEEDLE, DIABETIC 31 GAUGE X 5/16" (8 MM)
11 refills | 0 days | Status: CP
Start: 2020-10-28 — End: 2021-10-28

## 2020-10-28 MED ORDER — BLOOD-GLUCOSE METER KIT WRAPPER
11 refills | 0 days | Status: CP
Start: 2020-10-28 — End: 2021-10-28

## 2020-10-28 MED ORDER — LISINOPRIL 5 MG TABLET
ORAL_TABLET | Freq: Every day | ORAL | 2 refills | 30.00000 days | Status: CP
Start: 2020-10-28 — End: 2021-02-03

## 2020-10-28 MED ORDER — EMPAGLIFLOZIN 10 MG TABLET
ORAL_TABLET | Freq: Every morning | ORAL | 2 refills | 30.00000 days | Status: CP
Start: 2020-10-28 — End: 2021-01-31

## 2020-11-02 MED ORDER — OMEPRAZOLE 40 MG CAPSULE,DELAYED RELEASE
ORAL_CAPSULE | 0 refills | 0 days
Start: 2020-11-02 — End: ?

## 2020-11-04 MED ORDER — OMEPRAZOLE 40 MG CAPSULE,DELAYED RELEASE
ORAL_CAPSULE | 0 refills | 0.00000 days
Start: 2020-11-04 — End: ?

## 2020-11-14 ENCOUNTER — Encounter: Admit: 2020-11-14 | Discharge: 2020-11-15 | Payer: PRIVATE HEALTH INSURANCE

## 2020-11-14 DIAGNOSIS — E1165 Type 2 diabetes mellitus with hyperglycemia: Principal | ICD-10-CM

## 2020-11-14 DIAGNOSIS — I10 Essential (primary) hypertension: Principal | ICD-10-CM

## 2020-11-14 DIAGNOSIS — I2699 Other pulmonary embolism without acute cor pulmonale: Principal | ICD-10-CM

## 2020-12-12 ENCOUNTER — Encounter: Admit: 2020-12-12 | Discharge: 2020-12-13 | Payer: PRIVATE HEALTH INSURANCE

## 2020-12-12 DIAGNOSIS — L2084 Intrinsic (allergic) eczema: Principal | ICD-10-CM

## 2020-12-12 DIAGNOSIS — I2699 Other pulmonary embolism without acute cor pulmonale: Principal | ICD-10-CM

## 2020-12-12 DIAGNOSIS — I10 Essential (primary) hypertension: Principal | ICD-10-CM

## 2020-12-12 DIAGNOSIS — E1165 Type 2 diabetes mellitus with hyperglycemia: Principal | ICD-10-CM

## 2020-12-12 MED ORDER — DUPILUMAB 300 MG/2 ML SUBCUTANEOUS SYRINGE
SUBCUTANEOUS | 5 refills | 28 days | Status: CP
Start: 2020-12-12 — End: ?

## 2020-12-12 MED ORDER — LANTUS SOLOSTAR U-100 INSULIN 100 UNIT/ML (3 ML) SUBCUTANEOUS PEN
Freq: Every evening | SUBCUTANEOUS | prn refills | 75.00000 days | Status: CP
Start: 2020-12-12 — End: 2021-12-12

## 2020-12-12 MED ORDER — INSULIN LISPRO (U-100) 100 UNIT/ML SUBCUTANEOUS PEN
prn refills | 0.00000 days | Status: CP
Start: 2020-12-12 — End: ?

## 2020-12-28 ENCOUNTER — Encounter: Admit: 2020-12-28 | Discharge: 2020-12-28 | Payer: PRIVATE HEALTH INSURANCE

## 2020-12-28 DIAGNOSIS — I10 Essential (primary) hypertension: Principal | ICD-10-CM

## 2020-12-28 DIAGNOSIS — I2699 Other pulmonary embolism without acute cor pulmonale: Principal | ICD-10-CM

## 2020-12-28 MED ORDER — RIVAROXABAN 20 MG TABLET
ORAL_TABLET | Freq: Every day | ORAL | 11 refills | 30 days | Status: CP
Start: 2020-12-28 — End: 2021-12-28

## 2021-01-22 MED ORDER — OMEPRAZOLE 40 MG CAPSULE,DELAYED RELEASE
ORAL_CAPSULE | 0 refills | 0.00000 days
Start: 2021-01-22 — End: ?

## 2021-01-31 MED ORDER — EMPAGLIFLOZIN 10 MG TABLET
ORAL_TABLET | Freq: Every morning | ORAL | 2 refills | 30.00000 days | Status: CP
Start: 2021-01-31 — End: 2021-05-01

## 2021-02-03 ENCOUNTER — Encounter: Admit: 2021-02-03 | Discharge: 2021-02-03 | Payer: PRIVATE HEALTH INSURANCE

## 2021-02-03 DIAGNOSIS — E119 Type 2 diabetes mellitus without complications: Principal | ICD-10-CM

## 2021-02-03 DIAGNOSIS — K219 Gastro-esophageal reflux disease without esophagitis: Principal | ICD-10-CM

## 2021-02-03 DIAGNOSIS — L209 Atopic dermatitis, unspecified: Principal | ICD-10-CM

## 2021-02-03 DIAGNOSIS — I10 Essential (primary) hypertension: Principal | ICD-10-CM

## 2021-02-03 DIAGNOSIS — I2699 Other pulmonary embolism without acute cor pulmonale: Principal | ICD-10-CM

## 2021-02-03 DIAGNOSIS — K8050 Calculus of bile duct without cholangitis or cholecystitis without obstruction: Principal | ICD-10-CM

## 2021-02-03 DIAGNOSIS — E785 Hyperlipidemia, unspecified: Principal | ICD-10-CM

## 2021-02-03 DIAGNOSIS — F32A Depression, unspecified depression type: Principal | ICD-10-CM

## 2021-02-03 DIAGNOSIS — B20 Human immunodeficiency virus [HIV] disease: Principal | ICD-10-CM

## 2021-02-03 MED ORDER — LISINOPRIL 5 MG TABLET
ORAL_TABLET | Freq: Every day | ORAL | 3 refills | 90.00000 days | Status: CP
Start: 2021-02-03 — End: 2021-02-07

## 2021-02-03 MED ORDER — OMEPRAZOLE 40 MG CAPSULE,DELAYED RELEASE
ORAL_CAPSULE | Freq: Every day | ORAL | 0 refills | 30.00000 days | Status: CP
Start: 2021-02-03 — End: ?

## 2021-02-03 MED ORDER — CLOBETASOL 0.05 % TOPICAL OINTMENT
Freq: Two times a day (BID) | TOPICAL | 1 refills | 0 days | Status: CP
Start: 2021-02-03 — End: 2022-02-03

## 2021-02-07 MED ORDER — LISINOPRIL 5 MG TABLET
ORAL_TABLET | Freq: Every day | ORAL | 2 refills | 90 days | Status: CP
Start: 2021-02-07 — End: 2022-02-07

## 2021-03-05 MED ORDER — TRIAMCINOLONE ACETONIDE 0.1 % TOPICAL OINTMENT
0 days
Start: 2021-03-05 — End: ?

## 2021-03-15 MED ORDER — OMEPRAZOLE 40 MG CAPSULE,DELAYED RELEASE
ORAL_CAPSULE | Freq: Every day | ORAL | 5 refills | 30 days | Status: CP
Start: 2021-03-15 — End: ?

## 2021-03-25 ENCOUNTER — Encounter (HOSPITAL_COMMUNITY): Payer: Self-pay | Admitting: Emergency Medicine

## 2021-03-25 ENCOUNTER — Inpatient Hospital Stay (HOSPITAL_COMMUNITY)
Admission: EM | Admit: 2021-03-25 | Discharge: 2021-03-28 | DRG: 970 | Disposition: A | Discharge status: Home / Self Care | Payer: Medicare Other | Attending: Family Medicine | Admitting: Family Medicine

## 2021-03-25 ENCOUNTER — Other Ambulatory Visit: Payer: Self-pay

## 2021-03-25 ENCOUNTER — Ambulatory Visit (INDEPENDENT_AMBULATORY_CARE_PROVIDER_SITE_OTHER): Payer: Medicare Other

## 2021-03-25 ENCOUNTER — Emergency Department (HOSPITAL_COMMUNITY): Payer: Medicare Other

## 2021-03-25 ENCOUNTER — Ambulatory Visit (HOSPITAL_COMMUNITY)
Admission: EM | Admit: 2021-03-25 | Discharge: 2021-03-25 | Disposition: A | Discharge status: Home / Self Care | Payer: Medicare Other | Attending: Emergency Medicine | Admitting: Emergency Medicine

## 2021-03-25 DIAGNOSIS — Z91013 Allergy to seafood: Secondary | ICD-10-CM

## 2021-03-25 DIAGNOSIS — E785 Hyperlipidemia, unspecified: Secondary | ICD-10-CM | POA: Diagnosis present

## 2021-03-25 DIAGNOSIS — E119 Type 2 diabetes mellitus without complications: Secondary | ICD-10-CM | POA: Diagnosis not present

## 2021-03-25 DIAGNOSIS — Z880 Allergy status to penicillin: Secondary | ICD-10-CM

## 2021-03-25 DIAGNOSIS — I110 Hypertensive heart disease with heart failure: Secondary | ICD-10-CM | POA: Diagnosis present

## 2021-03-25 DIAGNOSIS — I5032 Chronic diastolic (congestive) heart failure: Secondary | ICD-10-CM | POA: Diagnosis present

## 2021-03-25 DIAGNOSIS — M109 Gout, unspecified: Secondary | ICD-10-CM | POA: Diagnosis present

## 2021-03-25 DIAGNOSIS — I252 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 infarction: Secondary | ICD-10-CM

## 2021-03-25 DIAGNOSIS — A419 Sepsis, unspecified organism: Secondary | ICD-10-CM

## 2021-03-25 DIAGNOSIS — Z888 Allergy status to other drugs, medicaments and biological substances status: Secondary | ICD-10-CM

## 2021-03-25 DIAGNOSIS — N179 Acute kidney failure, unspecified: Secondary | ICD-10-CM | POA: Diagnosis present

## 2021-03-25 DIAGNOSIS — Z91040 Latex allergy status: Secondary | ICD-10-CM

## 2021-03-25 DIAGNOSIS — M009 Pyogenic arthritis, unspecified: Principal | ICD-10-CM | POA: Diagnosis present

## 2021-03-25 DIAGNOSIS — F39 Unspecified mood [affective] disorder: Secondary | ICD-10-CM | POA: Diagnosis present

## 2021-03-25 DIAGNOSIS — B20 Human immunodeficiency virus [HIV] disease: Secondary | ICD-10-CM | POA: Diagnosis present

## 2021-03-25 DIAGNOSIS — K219 Gastro-esophageal reflux disease without esophagitis: Secondary | ICD-10-CM | POA: Diagnosis present

## 2021-03-25 DIAGNOSIS — Z20822 Contact with and (suspected) exposure to covid-19: Secondary | ICD-10-CM | POA: Diagnosis present

## 2021-03-25 DIAGNOSIS — Z794 Long term (current) use of insulin: Secondary | ICD-10-CM

## 2021-03-25 DIAGNOSIS — M659 Unspecified synovitis and tenosynovitis, unspecified site: Secondary | ICD-10-CM

## 2021-03-25 DIAGNOSIS — I2510 Atherosclerotic heart disease of native coronary artery without angina pectoris: Secondary | ICD-10-CM | POA: Diagnosis present

## 2021-03-25 DIAGNOSIS — G479 Sleep disorder, unspecified: Secondary | ICD-10-CM | POA: Diagnosis present

## 2021-03-25 DIAGNOSIS — M65131 Other infective (teno)synovitis, right wrist: Secondary | ICD-10-CM | POA: Diagnosis present

## 2021-03-25 DIAGNOSIS — Z86718 Personal history of other venous thrombosis and embolism: Secondary | ICD-10-CM

## 2021-03-25 DIAGNOSIS — E1141 Type 2 diabetes mellitus with diabetic mononeuropathy: Secondary | ICD-10-CM | POA: Diagnosis present

## 2021-03-25 DIAGNOSIS — M25531 Pain in right wrist: Secondary | ICD-10-CM | POA: Diagnosis not present

## 2021-03-25 DIAGNOSIS — Z79899 Other long term (current) drug therapy: Secondary | ICD-10-CM

## 2021-03-25 DIAGNOSIS — Z7984 Long term (current) use of oral hypoglycemic drugs: Secondary | ICD-10-CM

## 2021-03-25 DIAGNOSIS — Z86711 Personal history of pulmonary embolism: Secondary | ICD-10-CM

## 2021-03-25 LAB — CBC WITH DIFFERENTIAL/PLATELET
Abs Immature Granulocytes: 0.02 10*3/uL (ref 0.00–0.07)
Basophils Absolute: 0 10*3/uL (ref 0.0–0.1)
Basophils Relative: 0 %
Eosinophils Absolute: 0 10*3/uL (ref 0.0–0.5)
Eosinophils Relative: 0 %
HCT: 42.8 % (ref 39.0–52.0)
Hemoglobin: 13.6 g/dL (ref 13.0–17.0)
Immature Granulocytes: 0 %
Lymphocytes Relative: 18 %
Lymphs Abs: 1.5 10*3/uL (ref 0.7–4.0)
MCH: 28.8 pg (ref 26.0–34.0)
MCHC: 31.8 g/dL (ref 30.0–36.0)
MCV: 90.5 fL (ref 80.0–100.0)
Monocytes Absolute: 0.8 10*3/uL (ref 0.1–1.0)
Monocytes Relative: 10 %
Neutro Abs: 6.2 10*3/uL (ref 1.7–7.7)
Neutrophils Relative %: 72 %
Platelets: 254 10*3/uL (ref 150–400)
RBC: 4.73 MIL/uL (ref 4.22–5.81)
RDW: 14.6 % (ref 11.5–15.5)
WBC: 8.6 10*3/uL (ref 4.0–10.5)
nRBC: 0 % (ref 0.0–0.2)

## 2021-03-25 LAB — RESP PANEL BY RT-PCR (FLU A&B, COVID) ARPGX2
Influenza A by PCR: NEGATIVE
Influenza B by PCR: NEGATIVE
SARS Coronavirus 2 by RT PCR: NEGATIVE

## 2021-03-25 LAB — SYNOVIAL CELL COUNT + DIFF, W/ CRYSTALS
Crystals, Fluid: NONE SEEN
Eosinophils-Synovial: 0 % (ref 0–1)
Lymphocytes-Synovial Fld: 3 % (ref 0–20)
Monocyte-Macrophage-Synovial Fluid: 2 % — ABNORMAL LOW (ref 50–90)
Neutrophil, Synovial: 95 % — ABNORMAL HIGH (ref 0–25)
WBC, Synovial: 32750 /mm3 — ABNORMAL HIGH (ref 0–200)

## 2021-03-25 LAB — BASIC METABOLIC PANEL WITH GFR
Anion gap: 8 (ref 5–15)
BUN: 9 mg/dL (ref 6–20)
CO2: 23 mmol/L (ref 22–32)
Calcium: 9 mg/dL (ref 8.9–10.3)
Chloride: 105 mmol/L (ref 98–111)
Creatinine, Ser: 1.3 mg/dL — ABNORMAL HIGH (ref 0.61–1.24)
GFR, Estimated: >60 mL/min
Glucose, Bld: 127 mg/dL — ABNORMAL HIGH (ref 70–99)
Potassium: 3.4 mmol/L — ABNORMAL LOW (ref 3.5–5.1)
Sodium: 136 mmol/L (ref 135–145)

## 2021-03-25 LAB — SEDIMENTATION RATE: Sed Rate: 50 mm/h — ABNORMAL HIGH (ref 0–16)

## 2021-03-25 LAB — C-REACTIVE PROTEIN: CRP: 3.9 mg/dL — ABNORMAL HIGH (ref <1.0)

## 2021-03-25 LAB — PROTIME-INR
INR: 1.4 — ABNORMAL HIGH (ref 0.8–1.2)
Prothrombin Time: 16.9 s — ABNORMAL HIGH (ref 11.4–15.2)

## 2021-03-25 MED ORDER — ACETAMINOPHEN 500 MG PO TABS
1000.0000 mg | ORAL_TABLET | Freq: Once | ORAL | Status: AC
  Administered 2021-03-25: 1000 mg via ORAL
  Filled 2021-03-25: qty 2

## 2021-03-25 MED ORDER — BICTEGRAVIR-EMTRICITAB-TENOFOV 50-200-25 MG PO TABS
1.0000 | ORAL_TABLET | Freq: Every day | ORAL | Status: DC
  Administered 2021-03-26 – 2021-03-28 (×3): 1 via ORAL
  Filled 2021-03-25 (×3): qty 1

## 2021-03-25 MED ORDER — ACETAMINOPHEN 325 MG PO TABS
650.0000 mg | ORAL_TABLET | Freq: Four times a day (QID) | ORAL | Status: DC | PRN
  Administered 2021-03-26: 650 mg via ORAL
  Filled 2021-03-25: qty 2

## 2021-03-25 MED ORDER — SODIUM CHLORIDE 0.9 % IV SOLN
1.0000 g | Freq: Once | INTRAVENOUS | Status: AC
  Administered 2021-03-25: 1 g via INTRAVENOUS
  Filled 2021-03-25: qty 10

## 2021-03-25 MED ORDER — ACETAMINOPHEN 650 MG RE SUPP: 650.0000 mg | Freq: Four times a day (QID) | RECTAL | Status: DC | PRN

## 2021-03-25 MED ORDER — LISINOPRIL 2.5 MG PO TABS: 2.5000 mg | ORAL_TABLET | Freq: Every day | ORAL | Status: DC

## 2021-03-25 MED ORDER — LIDOCAINE HCL (PF) 1 % IJ SOLN
10.0000 mL | Freq: Once | INTRAMUSCULAR | Status: AC
  Administered 2021-03-25: 1 mL via INTRADERMAL
  Filled 2021-03-25: qty 10

## 2021-03-25 MED ORDER — ATORVASTATIN CALCIUM 40 MG PO TABS
40.0000 mg | ORAL_TABLET | Freq: Every day | ORAL | Status: DC
  Administered 2021-03-26 – 2021-03-28 (×3): 40 mg via ORAL
  Filled 2021-03-25 (×3): qty 1

## 2021-03-25 MED ORDER — VANCOMYCIN HCL 2000 MG/400ML IV SOLN
2000.0000 mg | Freq: Once | INTRAVENOUS | Status: AC
  Administered 2021-03-26: 2000 mg via INTRAVENOUS
  Filled 2021-03-25: qty 400

## 2021-03-25 MED ORDER — VANCOMYCIN HCL 1250 MG/250ML IV SOLN
1250.0000 mg | Freq: Two times a day (BID) | INTRAVENOUS | Status: DC
  Administered 2021-03-26 – 2021-03-27 (×3): 1250 mg via INTRAVENOUS
  Filled 2021-03-25 (×4): qty 250

## 2021-03-25 MED ORDER — LACTATED RINGERS IV BOLUS
1000.0000 mL | Freq: Once | INTRAVENOUS | Status: AC
  Administered 2021-03-26: 1000 mL via INTRAVENOUS

## 2021-03-25 MED ORDER — FENTANYL CITRATE (PF) 100 MCG/2ML IJ SOLN
50.0000 ug | Freq: Once | INTRAMUSCULAR | Status: AC
  Administered 2021-03-25: 50 ug via INTRAVENOUS
  Filled 2021-03-25: qty 2

## 2021-03-25 MED ORDER — HYDROXYZINE HCL 25 MG PO TABS
25.0000 mg | ORAL_TABLET | Freq: Every evening | ORAL | Status: DC | PRN
  Administered 2021-03-26: 25 mg via ORAL
  Filled 2021-03-25 (×2): qty 1

## 2021-03-25 MED ORDER — OXYCODONE HCL 5 MG PO TABS
5.0000 mg | ORAL_TABLET | Freq: Four times a day (QID) | ORAL | Status: DC | PRN
Start: 2021-03-25 — End: 2021-03-29
  Administered 2021-03-26 – 2021-03-27 (×2): 5 mg via ORAL
  Filled 2021-03-25 (×2): qty 1

## 2021-03-25 MED ORDER — SERTRALINE HCL 50 MG PO TABS
50.0000 mg | ORAL_TABLET | Freq: Every day | ORAL | Status: DC
  Administered 2021-03-26 – 2021-03-28 (×3): 50 mg via ORAL
  Filled 2021-03-25 (×3): qty 1

## 2021-03-25 MED ORDER — OXYCODONE-ACETAMINOPHEN 5-325 MG PO TABS
1.0000 | ORAL_TABLET | Freq: Once | ORAL | Status: AC
  Administered 2021-03-25: 1 via ORAL
  Filled 2021-03-25: qty 1

## 2021-03-25 MED ORDER — INSULIN ASPART 100 UNIT/ML ~~LOC~~ SOLN: 0.0000 [IU] | Freq: Three times a day (TID) | SUBCUTANEOUS | Status: DC

## 2021-03-25 NOTE — H&P (Addendum)
[facility] Admission History and Physical Service Pager: [phone]  Patient name: Craig [name] Medical record number: [id number] Date of birth: 12/02/1961 Age: 60 y.o. Gender: male  Primary Care Provider: Toni Arthurs, PA Consultants: Hand surgery in am Code Status: Full  Chief Complaint: right wrist pain  Assessment and Plan: Marisa Hufstetler is a 60 y.o. male presenting with right wrist pain concerning for septic arthritis . PMH is significant for HIV (well-controlled), diabetes, hypertension, history of DVT, HFpEF.  Sepsis, likely secondary to septic joint Mr. Zajicek presents to urgent care today with 2 days of worsening wrist pain.  Reports that he has been having trouble flexing and extending his fingers for about 1 day now.  Vitals are notable for tachycardia and fever up to 102.  Physical exam is notable for a swollen right wrist with difficulty with finger flexion and extension.  Right wrist aspirated in the ED.  Initial labs are notable for creatinine 1.3, CRP 3.9, ESR 50, INR 1.4.  Wrist x-ray is generally unremarkable.  Wrist aspiration performed in the emergency department with labs pending.  The differential at this time includes, septic joint, crystal arthropathy, swelling and inflammation secondary to mechanical injury.  Based on his report of the injury of his hand, I have low suspicion of swelling secondary only to trauma, and this would not explain his fever.  Gout is possible although it would also not explain his fever.  I am concerned about possible septic joint especially given his known history of issues with this wrist.  For now, we will admit and begin IV antibiotics with ceftriaxone and vancomycin.  We will plan for further imaging and likely consult hand surgery in the morning. -Admit to MedSurg, attending Dr. Thompson Grayer -S/p ceftriaxone -Continue vancomycin -Follow-up blood cultures -Follow-up wrist fluid studies -f/u wrist [place] -Consider  consult to hand surgery in the morning -tylenol -oxycodone 5 mg PRN  AKI Baseline creatinine appears to be about 1.0.  Creatinine on admission today is 1.3.  Possibly secondary to sepsis. -Hold home lisinopril -Avoid nephrotoxic medication  HIV, well controlled Last CD4 count 517 (06/2020), last viral load undetectable (06/2020).  Home medication currently includes Biktarvy. -Continue Biktarvy  Diabetes Last A1c 5.7 (02/03/2021).  On medication currently includes glipizide, oral semaglutide. -Hold home medications -SSI sensitive -Monitor CBGs  History of DVT He has previously been on warfarin and was transitioned to rivaroxaban this past January. -Holding rivaroxaban in case of surgery -SCDs  Mood disorder -Continue home sertraline  Sleep disorder -Home Atarax nightly as needed  Heart failure with preserved ejection fraction Echo from 10/2018 demonstrates an EF of 50-55% with grade 1 diastolic dysfunction.  No evidence of fluid overload on exam. -Monitor fluid status  FEN/GI: General diet, n.p.o. at 3 AM in case of surgery Prophylaxis: SCDs for now, plan to restart rivaroxaban once he has been evaluated by hand surgery  Disposition: 1-2 days of hospitalization anticipated prior to discharge home  History of Present Illness:   Craig [name] is a 60 y.o. male presenting with right wrist pain concerning for septic arthritis . PMH is significant for HIV (well-controlled), diabetes, hypertension, history of DVT.  Mr. Vaeth reports that he first bumped his hand while he was at home on Thursday and this did not seem to bother him very much.  He went to work on Friday night at [employer] where he seemed to bump his hand again begin to experience some right wrist pain.  By Saturday morning,  he began to notice some difficulty using his hand which seem to progress through the day as his hand became progressively more painful.  He presented to urgent care at this time where an x-ray  was performed and he was told to go to the closest emergency room. [facility]  He has a history of gout which primarily affects his knees.  He feels that this is not similar to prior presentations for gout.  He has a history of septic joint of the right wrist in 2016 at which time he was admitted to the internal medicine service and ultimately had a wrist washout performed by hand surgery and was sent home on a course of doxycycline.  Since then, he has also had 1 presentation in the ED for right wrist pain which was treated with a course of colchicine as it was thought to be gout.  Review Of Systems: Per HPI with the following additions:   Review of Systems  Constitutional: Positive for fever.  HENT: Positive for congestion. Negative for sore throat.   Respiratory: Negative for cough and shortness of breath.   Cardiovascular: Negative for chest pain and palpitations.  Gastrointestinal: Positive for diarrhea (loose stools for several days). Negative for abdominal pain and vomiting.  Musculoskeletal: Positive for joint pain. Negative for myalgias.  Skin: Negative for rash.  Neurological: Negative for headaches.    Patient Active Problem List   Diagnosis Date Noted  . Septic arthritis ([place]) 03/25/2021  . Herpes zoster without complication 02/54/2706  . Atopic dermatitis 12/30/2017  . Obesity (BMI 30.0-34.9) 08/22/2017  . Diastasis recti 01/24/2017  . Type 2 diabetes mellitus without complication, with long-term current use of insulin ([place]) 01/25/2016  . Allergic rhinitis 01/25/2016  . Elevated INR   . HIV infection ([place]) 03/08/2014  . History of pulmonary embolus (PE) 03/08/2014  . History of DVT (deep vein thrombosis) 03/08/2014  . Long term current use of anticoagulant therapy 02/26/2014  . Coronary atherosclerosis of unspecified type of vessel, native or graft 02/26/2014  . Dermatitis 02/26/2014  . Depressive disorder, not elsewhere classified 02/26/2014  . Asthma, chronic 02/26/2014   . Coronary atherosclerosis 02/26/2014  . Clinical depression 02/26/2014  . Chronic diastolic heart failure ([place]) 04/09/2013  . Esophageal reflux 04/09/2013  . Essential hypertension 04/09/2013  . Gout 04/09/2013  . 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 Chronic pulmonary heart disease ([place]) 04/09/2013  . Congestive heart failure ([place]) 04/09/2013  . Convulsions ([place]) 04/09/2013  . Diastolic heart failure ([place]) 04/09/2013  . Allergy to latex 04/09/2013  . Inguinal hernia 04/09/2013  . 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 Dysplasia of anus 12/29/2012  . Molluscum contagiosum 09/29/2012  . Cytomegalovirus infection ([place]) 02/13/2012  . Horseshoe tear of retina 02/13/2012  . Airway hyperreactivity 09/08/2010  . Hereditary and idiopathic peripheral neuropathy 09/08/2010    Past Medical History: Past Medical History:  Diagnosis Date  . AIDS ([place]) 05/24/2015  . Allergic rhinitis 01/25/2016  . Atopic dermatitis 12/30/2017  . Attack, epileptiform ([place]) 04/09/2013  . CMV (cytomegalovirus infection) ([place]) 02/13/2012  . Cytomegalovirus ([place]) 02-13-2012  . Deep vein thrombosis ([place]) 05/09/2011   Overview:  Jan 2012   . Diabetes mellitus, controlled ([place]) 01/25/2016  . Dysplasia of anus   . Esophageal reflux   . 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 Hernia, inguinal 04/09/2013  . HIV (human immunodeficiency virus infection) ([place])   . HIV (human immunodeficiency virus infection) ([place])   . Horseshoe tear of retina without detachment 02-13-12  . Hypermetropia  02-18-2012  . Molluscum contagiosum 09-29-2012  . 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 infarction   . Other convulsions   . PE (pulmonary embolism) 04/09/2013   Overview:  Jan 2012   . Personal history of venous thrombosis and embolism   . Secondary iridocyclitis, infectious   . Septic arthritis of wrist ([place])    rt  . Shortness of breath dyspnea   . Unspecified hereditary and idiopathic peripheral neuropathy   . 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  Viral URI 03/10/2018    Past Surgical History: Past Surgical History:  Procedure Laterality Date  . COLONOSCOPY    . I & D EXTREMITY Right 07/28/2015   Procedure: IRRIGATION AND DEBRIDEMENT WRIST;  Surgeon: Dayna Barker, MD;  Location: [facility];  Service: Plastics;  Laterality: Right;    Social History: Social History   Tobacco Use  . Smoking status: Never Smoker  . Smokeless tobacco: Never Used  Vaping Use  . Vaping Use: Never used  Substance Use Topics  . Alcohol use: No    Alcohol/week: 0.0 standard drinks  . Drug use: No    Family History: No family history on file.   Allergies and Medications: Allergies  Allergen Reactions  . Penicillins Hives and Rash    Has patient had a PCN reaction causing immediate rash, facial/tongue/throat swelling, SOB or lightheadedness with hypotension:YES Has patient had a PCN reaction causing Has patient had a PCN reaction that required hospitalization NO Has patient had a PCN reaction occurring within the last 10 years: YES If all of the above answers are "NO", then may proceed with Cephalosporin use.  . Shellfish Allergy Shortness Of Breath  . Latex Rash  . Metformin Nausea Only    Both IR and XR formulations   No current facility-administered medications on file prior to encounter.   Current Outpatient Medications on File Prior to Encounter  Medication Sig Dispense Refill  . albuterol (PROVENTIL HFA;VENTOLIN HFA) 108 (90 BASE) MCG/ACT inhaler Inhale 2 puffs into the lungs every 6 (six) hours as needed for wheezing or shortness of breath.    Marland Kitchen atorvastatin (LIPITOR) 40 MG tablet Take 40 mg by mouth daily.    . bictegravir-emtricitabine-tenofovir AF (BIKTARVY) 50-200-25 MG TABS tablet Take 1 tablet by mouth daily. 30 tablet 11  . colchicine 0.6 MG tablet Take 1 tablet (0.6 mg total) by mouth daily. 2 tablet 0  . DUPIXENT 300 MG/2ML prefilled syringe     . glipiZIDE (GLUCOTROL XL) 2.5 MG 24 hr tablet Take 2.5 mg by mouth.    .  hydrOXYzine (ATARAX/VISTARIL) 25 MG tablet Take 1 tablet (25 mg total) by mouth at bedtime as needed. 30 tablet 11  . insulin lispro (HUMALOG) 100 UNIT/ML KwikPen Inject into the skin.    Marland Kitchen LANTUS SOLOSTAR 100 UNIT/ML Solostar Pen Inject into the skin.    Marland Kitchen lisinopril (PRINIVIL,ZESTRIL) 2.5 MG tablet Take 2.5 mg by mouth daily.    . Semaglutide (RYBELSUS) 14 MG TABS Take 14 mg by mouth daily.    . sertraline (ZOLOFT) 50 MG tablet Take 50 mg by mouth daily.    . traMADol (ULTRAM) 50 MG tablet Take 1 tablet (50 mg total) by mouth every 6 (six) hours as needed. 15 tablet 0  . triamcinolone ointment (KENALOG) 0.1 % Apply topically 2 (two) times daily.    Marland Kitchen ACCU-CHEK AVIVA PLUS test strip     . Alcohol Swabs (ALCOHOL PREP) 70 % PADS     . ASSURE COMFORT LANCETS 30G MISC  Objective: BP 123/73   Pulse (!) 118   Temp (!) 102 F (38.9 C) (Oral)   Resp 17   SpO2 96%  Physical Exam Constitutional:      Appearance: Normal appearance. He is normal weight.  HENT:     Head: Normocephalic and atraumatic.     Nose: Nose normal.     Mouth/Throat:     Mouth: Mucous membranes are moist.     Pharynx: Oropharynx is clear.  Eyes:     Extraocular Movements: Extraocular movements intact.     Conjunctiva/sclera: Conjunctivae normal.     Pupils: Pupils are equal, round, and reactive to light.  Cardiovascular:     Rate and Rhythm: Regular rhythm. Tachycardia present.     Pulses: Normal pulses.     Heart sounds: Normal heart sounds.  Pulmonary:     Effort: Pulmonary effort is normal.     Breath sounds: Normal breath sounds.  Abdominal:     General: Bowel sounds are normal. There is no distension.     Palpations: Abdomen is soft.     Tenderness: There is no abdominal tenderness. There is no guarding.  Musculoskeletal:        General: Swelling and tenderness present. No deformity or signs of injury.     Cervical back: Normal range of motion and neck supple.     Comments: Inspection: Mild  swelling over the right wrist compared to the left.  No overlying skin changes. Palpation: Mildly tender to palpation around the dorsal aspect of the wrist. Limited active range of motion.  Extension seem to be much more painful than flexion.  Able to passively flex all of the fingers.  Significant pain with passive extension of fingers 2-5.  Very limited wrist mobility.  Skin:    General: Skin is warm and dry.  Neurological:     General: No focal deficit present.     Mental Status: He is alert. Mental status is at baseline.     Cranial Nerves: No cranial nerve deficit.  Psychiatric:        Mood and Affect: Mood normal.       Labs and Imaging: CBC BMET  Recent Labs  Lab 03/25/21 1620  WBC 8.6  HGB 13.6  HCT 42.8  PLT 254   Recent Labs  Lab 03/25/21 1620  NA 136  K 3.4*  CL 105  CO2 23  BUN 9  CREATININE 1.30*  GLUCOSE 127*  CALCIUM 9.0     DG Wrist Complete Right  Result Date: 03/25/2021 CLINICAL DATA:  Work injury EXAM: RIGHT WRIST - COMPLETE 3+ VIEW COMPARISON:  None. FINDINGS: No distal radius or ulnar fracture. Radiocarpal joint is intact. No carpal fracture. No soft tissue abnormality. IMPRESSION: No fracture or dislocation. Electronically Signed   By: Suzy Bouchard M.D.   On: 03/25/2021 12:26   DG Wrist Complete Right  Result Date: 03/25/2021 CLINICAL DATA:  Right wrist and forearm pain and swelling, no known injury. History of gout. EXAM: RIGHT WRIST - COMPLETE 3+ VIEW COMPARISON:  None. FINDINGS: No acute fracture or malalignment. No evidence of erosive changes or other evidence of inflammatory arthropathy. A small 2-3 mm radiopaque foreign body is present in the superficial soft tissues of the dorsal mid forearm overlying the ulna. Marked soft tissue swelling is present along the dorsal aspect of the wrist joint. IMPRESSION: 1. Soft tissue swelling over the dorsal aspect of the wrist without evidence of fracture, malalignment or erosive bony changes. 2. Small  3  mm radiopaque foreign body in the superficial soft tissues of the dorsal mid forearm overlying the ulna. Electronically Signed   By: Jacqulynn Cadet M.D.   On: 03/25/2021 10:58   DG Chest Portable 1 View  Result Date: 03/25/2021 CLINICAL DATA:  Fever EXAM: PORTABLE CHEST 1 VIEW COMPARISON:  06/17/2016 FINDINGS: Linear scarring at the left base. No confluent opacities. Heart is normal size. No effusions or acute bony abnormality. IMPRESSION: No active disease. Electronically Signed   By: Rolm Baptise M.D.   On: 03/25/2021 22:21     Matilde Haymaker, MD 03/25/2021, 11:25 PM PGY-3, [facility] pager: [phone], text pages welcome

## 2021-03-25 NOTE — Discharge Instructions (Signed)
GO to the Emergency Department for further evaluation of possible septic wrist.

## 2021-03-25 NOTE — ED Provider Notes (Signed)
Patient placed in Quick Look pathway, seen and evaluated   Chief Complaint: right wrist pain  HPI:   60 y/o M presenting to the ED for eval of right wrist pain that started last night. States he hit it on something and his pain has gotten worse  Since onset. He was seen at urgent care [facility] and there was concern for a septic arthritis so he was sent here for further eval. Pt hiv + but states viral load is undetectable. Hx gout.  ROS: right wrist pain, denies fevers, chills (one)  Physical Exam:   Gen: No distress  Neuro: Awake and Alert  Skin: Warm    Focused Exam: TTP and swelling to the dorsum of the right wrist with erythema and warmth   Initiation of care has begun. The patient has been counseled on the process, plan, and necessity for staying for the completion/evaluation, and the remainder of the medical screening examination  MSE was initiated and I personally evaluated the patient and placed orders (if any) at  11:58 AM on March 25, 2021.  The patient appears stable so that the remainder of the MSE may be completed by another provider.    Rayne Du 03/25/21 1207    Gwyneth Sprout, MD 03/25/21 2120

## 2021-03-25 NOTE — ED Triage Notes (Signed)
C/o pain and swelling to R hand/wrist since yesterday.  States he bumped it while carrying laundry and pain started then.  Sent from [facility]  Reports chills.

## 2021-03-25 NOTE — ED Notes (Signed)
Patient is being discharged from the Urgent Care and sent to the Emergency Department via POV . Per Ether Griffins, NP, patient is in need of higher level of care due to Septic joint. Patient is aware and verbalizes understanding of plan of care.  Vitals:   03/25/21 1020  BP: (!) 157/92  Pulse: (!) 106  Resp: 18  Temp: 99.4 F (37.4 C)  SpO2: 96%

## 2021-03-25 NOTE — ED Provider Notes (Signed)
[facility]    CSN: [id number] Arrival date & time: 03/25/21  1002      History   Chief Complaint Chief Complaint  Patient presents with  . Arm Pain    HPI Craig [name] is a 60 y.o. male.   Patient here with complaint of right wrist pain and swelling.  Pain started yesterday and is burning and constant.  Able to move all fingers and wrist with full range of motion.  Has history of gout which has been relieved with colchicine in the past but reports prescription has expired.  Has not taken anything or tried any over-the-counter medications or treatments. Denies any specific alleviating or aggravating factors.  Denies any fevers, chest pain, shortness of breath, N/V/D, numbness, tingling, weakness, abdominal pain, or headaches.   ROS: As per HPI, all other pertinent ROS negative   The history is provided by the patient.    Past Medical History:  Diagnosis Date  . AIDS (HCC) 05/24/2015  . Allergic rhinitis 01/25/2016  . Atopic dermatitis 12/30/2017  . 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 Deep vein thrombosis (HCC) 05/09/2011   Overview:  Jan 2012   . Diabetes mellitus, controlled (HCC) 01/25/2016  . Dysplasia of anus   . Esophageal reflux   . 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 Hernia, inguinal 04/09/2013  . HIV (human immunodeficiency virus infection) (HCC)   . Horseshoe tear of retina without detachment 02-13-12  . 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  . Molluscum contagiosum 09-29-2012  . 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 infarction   . Other convulsions   . PE (pulmonary embolism) 04/09/2013   Overview:  Jan 2012   . Personal history of venous thrombosis and embolism   . Secondary iridocyclitis, infectious   . Septic arthritis of wrist (HCC)    rt  . Shortness of breath dyspnea   . Unspecified hereditary and idiopathic peripheral neuropathy   . 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  Viral URI 03/10/2018    Patient Active Problem List   Diagnosis Date Noted  . Herpes zoster without complication 07/22/2018  . Atopic dermatitis 12/30/2017  . Obesity (BMI 30.0-34.9) 08/22/2017  . Diastasis recti 01/24/2017  . Type 2 diabetes mellitus without complication, with long-term current use of insulin (HCC) 01/25/2016  . Allergic rhinitis 01/25/2016  . Elevated INR   . HIV infection (HCC) 03/08/2014  . History of pulmonary embolus (PE) 03/08/2014  . History of DVT (deep vein thrombosis) 03/08/2014  . Long term current use of anticoagulant therapy 02/26/2014  . Coronary atherosclerosis of unspecified type of vessel, native or graft 02/26/2014  . Dermatitis 02/26/2014  . Depressive disorder, not elsewhere classified 02/26/2014  . Asthma, chronic 02/26/2014  . Coronary atherosclerosis 02/26/2014  . Clinical depression 02/26/2014  . Chronic diastolic heart failure (HCC) 04/09/2013  . Esophageal reflux 04/09/2013  . Essential hypertension 04/09/2013  . Gout 04/09/2013  . 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 Chronic pulmonary heart disease (HCC) 04/09/2013  . Congestive heart failure (HCC) 04/09/2013  . Convulsions (HCC) 04/09/2013  . Diastolic heart failure (HCC) 04/09/2013  . Allergy to latex 04/09/2013  . Inguinal hernia 04/09/2013  . 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 Dysplasia of anus 12/29/2012  . Molluscum contagiosum 09/29/2012  . 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 Horseshoe tear of retina 02/13/2012  . Airway hyperreactivity 09/08/2010  . Hereditary and idiopathic peripheral neuropathy 09/08/2010    Past Surgical History:  Procedure Laterality Date  .  COLONOSCOPY    . I & D EXTREMITY Right 07/28/2015   Procedure: IRRIGATION AND DEBRIDEMENT WRIST;  Surgeon: Knute NeuHarrill Coley, MD;  Location: [facility] OR;  Service: Plastics;  Laterality: Right;       Home Medications    Prior to Admission medications   Medication Sig Start Date End Date Taking? Authorizing  Provider  atorvastatin (LIPITOR) 40 MG tablet Take 40 mg by mouth daily.   Yes [provider]  bictegravir-emtricitabine-tenofovir AF (BIKTARVY) 50-200-25 MG TABS tablet Take 1 tablet by mouth daily. 07/21/20  Yes Blanchard Kelchixon, Stephanie N, NP  DUPIXENT 300 MG/2ML prefilled syringe  03/28/20  Yes [provider]  glipiZIDE (GLUCOTROL XL) 2.5 MG 24 hr tablet Take 2.5 mg by mouth. 12/22/15  Yes [provider]  insulin lispro (HUMALOG) 100 UNIT/ML KwikPen Inject into the skin. 06/09/20  Yes [provider]  LANTUS SOLOSTAR 100 UNIT/ML Solostar Pen Inject into the skin. 06/09/20  Yes [provider]  lisinopril (PRINIVIL,ZESTRIL) 2.5 MG tablet Take 2.5 mg by mouth daily.   Yes [provider]  Semaglutide (RYBELSUS) 14 MG TABS Take by mouth. 06/30/20  Yes [provider]  ACCU-CHEK AVIVA PLUS test strip  07/16/17   [provider]  albuterol (PROVENTIL HFA;VENTOLIN HFA) 108 (90 BASE) MCG/ACT inhaler Inhale 2 puffs into the lungs every 6 (six) hours as needed for wheezing or shortness of breath.    [provider]  Alcohol Swabs (ALCOHOL PREP) 70 % PADS  07/16/17   [provider]  ASSURE COMFORT LANCETS 30G MISC  07/16/17   [provider]  colchicine 0.6 MG tablet Take 1 tablet (0.6 mg total) by mouth daily. 05/22/18   Rancour, Jeannett SeniorStephen, MD  hydrOXYzine (ATARAX/VISTARIL) 25 MG tablet Take 1 tablet (25 mg total) by mouth at bedtime as needed. 07/21/20   Blanchard Kelchixon, Stephanie N, NP  predniSONE (DELTASONE) 20 MG tablet Take 1 tablet (20 mg total) by mouth 2 (two) times daily with a meal. 09/28/20   Eustace MooreNelson, Yvonne Sue, MD  sertraline (ZOLOFT) 50 MG tablet Take by mouth. 03/15/20 03/15/21  [provider]  traMADol (ULTRAM) 50 MG tablet Take 1 tablet (50 mg total) by mouth every 6 (six) hours as needed. 09/28/20   Eustace MooreNelson, Yvonne Sue, MD  triamcinolone ointment (KENALOG) 0.1 % Apply topically 2 (two) times daily. 07/04/20    [provider]  warfarin (COUMADIN) 5 MG tablet Take 5-7.5 mg by mouth daily. Take 1.5 (7.5mg ) daily except Sunday take 1 tab    [provider]    Family History History reviewed. No pertinent family history.  Social History Social History   Tobacco Use  . Smoking status: Never Smoker  . Smokeless tobacco: Never Used  Vaping Use  . Vaping Use: Never used  Substance Use Topics  . Alcohol use: No    Alcohol/week: 0.0 standard drinks  . Drug use: No     Allergies   Penicillins, Shellfish allergy, Latex, and Metformin   Review of Systems Review of Systems  Musculoskeletal: Positive for joint swelling.  All other systems reviewed and are negative.    Physical Exam Triage Vital Signs ED Triage Vitals  Enc Vitals Group     BP 03/25/21 1020 (!) 157/92     Pulse Rate 03/25/21 1020 (!) 106     Resp 03/25/21 1020 18     Temp 03/25/21 1020 99.4 F (37.4 C)     Temp Source 03/25/21 1020 Oral     SpO2 03/25/21  1020 96 %     Weight --      Height --      Head Circumference --      Peak Flow --      Pain Score 03/25/21 1016 10     Pain Loc --      Pain Edu? --      Excl. in GC? --    No data found.  Updated Vital Signs BP (!) 157/92 (BP Location: Left Arm)   Pulse (!) 106   Temp 99.4 F (37.4 C) (Oral)   Resp 18   SpO2 96%   Visual Acuity Right Eye Distance:   Left Eye Distance:   Bilateral Distance:    Right Eye Near:   Left Eye Near:    Bilateral Near:     Physical Exam Vitals and nursing note reviewed.  Constitutional:      General: He is not in acute distress.    Appearance: Normal appearance. He is not ill-appearing, toxic-appearing or diaphoretic.  HENT:     Head: Normocephalic and atraumatic.  Eyes:     Conjunctiva/sclera: Conjunctivae normal.  Cardiovascular:     Rate and Rhythm: Normal rate.     Pulses: Normal pulses.  Pulmonary:     Effort: Pulmonary effort is normal.  Abdominal:     General: Abdomen is flat.   Musculoskeletal:        General: Normal range of motion.     Right forearm: Swelling, tenderness and bony tenderness present.     Right wrist: Swelling and tenderness present. No lacerations. Normal range of motion.     Left wrist: Normal.     Cervical back: Normal range of motion.  Skin:    General: Skin is warm and dry.  Neurological:     General: No focal deficit present.     Mental Status: He is alert and oriented to person, place, and time.  Psychiatric:        Mood and Affect: Mood normal.      UC Treatments / Results  Labs (all labs ordered are listed, but only abnormal results are displayed) Labs Reviewed - No data to display  EKG   Radiology DG Wrist Complete Right  Result Date: 03/25/2021 CLINICAL DATA:  Right wrist and forearm pain and swelling, no known injury. History of gout. EXAM: RIGHT WRIST - COMPLETE 3+ VIEW COMPARISON:  None. FINDINGS: No acute fracture or malalignment. No evidence of erosive changes or other evidence of inflammatory arthropathy. A small 2-3 mm radiopaque foreign body is present in the superficial soft tissues of the dorsal mid forearm overlying the ulna. Marked soft tissue swelling is present along the dorsal aspect of the wrist joint. IMPRESSION: 1. Soft tissue swelling over the dorsal aspect of the wrist without evidence of fracture, malalignment or erosive bony changes. 2. Small 3 mm radiopaque foreign body in the superficial soft tissues of the dorsal mid forearm overlying the ulna. Electronically Signed   By: Malachy Moan M.D.   On: 03/25/2021 10:58    Procedures Procedures (including critical care time)  Medications Ordered in UC Medications - No data to display  Initial Impression / Assessment and Plan / UC Course  I have reviewed the triage vital signs and the nursing notes.  Pertinent labs & imaging results that were available during my care of the patient were reviewed by me and considered in my medical decision making (see  chart for details).     Right wrist pain  and swelling  Assessment concerning for possible septic arthritis of the right wrist.  Patient sent to the emergency room for further evaluation at a higher level of care. X-ray did show soft tissue swelling but no evidence of fracture malalignment or erosive bony changes Patient verbalized understanding and is headed to the emergency room Final Clinical Impressions(s) / UC Diagnoses   Final diagnoses:  Right wrist pain     Discharge Instructions     GO to the Emergency Department for further evaluation of possible septic wrist.        ED Prescriptions    None     PDMP not reviewed this encounter.   Ivette Loyal, NP 03/25/21 1113

## 2021-03-25 NOTE — ED Triage Notes (Addendum)
Right wrist pain since yesterday, no known injury.  Right forearm is sore , but wrist is painful and swollen.  Patient questions gout, reports having a history of gout. Right radial pulse 2 +

## 2021-03-25 NOTE — ED Notes (Signed)
Exposure panel drawn.

## 2021-03-25 NOTE — Progress Notes (Signed)
Pharmacy Antibiotic Note  Craig [name] is a 60 y.o. male admitted on 03/25/2021 with septic arthritis.  Pharmacy has been consulted for vancomycin dosing.  Plan: Vancomycin 2gm IV x 1 then 1250 mg IV q12 hours F/u renal function, cultures and clinical course     Temp (24hrs), Avg:100.2 F (37.9 C), Min:99.3 F (37.4 C), Max:102 F (38.9 C)  Recent Labs  Lab 03/25/21 1620  WBC 8.6  CREATININE 1.30*    CrCl cannot be calculated (Unknown ideal weight.).    Allergies  Allergen Reactions  . Penicillins Hives and Rash    Has patient had a PCN reaction causing immediate rash, facial/tongue/throat swelling, SOB or lightheadedness with hypotension:YES Has patient had a PCN reaction causing Has patient had a PCN reaction that required hospitalization NO Has patient had a PCN reaction occurring within the last 10 years: YES If all of the above answers are "NO", then may proceed with Cephalosporin use.  . Shellfish Allergy Shortness Of Breath  . Latex Rash  . Metformin Nausea Only    Both IR and XR formulations     Thank you for allowing pharmacy to be a part of this patient's care.  Talbert Cage Poteet 03/25/2021 11:58 PM

## 2021-03-25 NOTE — ED Provider Notes (Signed)
[facility] EMERGENCY DEPARTMENT Provider Note   CSN: [id number] Arrival date & time: 03/25/21  1139     History Chief Complaint  Craig [name] presents with  . hand swelling    Craig [name] is a 60 y.o. male.  Craig [name] is a 60 year old male with a history of PE and DVT on Xarelto, diabetes, and HIV who presents with 2 days of right wrist swelling.  Craig [name] denies any trauma to the wrist.  Craig [name] states that his pain is progressively gotten worse and his wrist is gotten more swollen.  He has not had any fevers or chills.  He has not had any skin changes or wounds to the area.  No chest pain or shortness of breath.  No nausea vomiting or diarrhea.  Craig [name] does have a history of gout in the past has had multiple emergency visits for this.  Craig [name] states he also has had this wrist swelling before 2 prior times once in 2019 and also previously in 2016.        Past Medical History:  Diagnosis Date  . AIDS ([place]) 05/24/2015  . Allergic rhinitis 01/25/2016  . Atopic dermatitis 12/30/2017  . Attack, epileptiform ([place]) 04/09/2013  . CMV (cytomegalovirus infection) ([place]) 02/13/2012  . Cytomegalovirus ([place]) 02-13-2012  . Deep vein thrombosis ([place]) 05/09/2011   Overview:  Jan 2012   . Diabetes mellitus, controlled ([place]) 01/25/2016  . Dysplasia of anus   . Esophageal reflux   . 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 Hernia, inguinal 04/09/2013  . HIV (human immunodeficiency virus infection) ([place])   . HIV (human immunodeficiency virus infection) ([place])   . Horseshoe tear of retina without detachment 02-13-12  . 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  . Molluscum contagiosum 09-29-2012  . 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 infarction   . Other convulsions   . PE (pulmonary embolism) 04/09/2013   Overview:  Jan 2012   . Personal history of venous thrombosis and embolism   . Secondary iridocyclitis, infectious   . Septic arthritis of wrist ([place])    rt  . Shortness of breath dyspnea   . Unspecified hereditary  and idiopathic peripheral neuropathy   . 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 Viral URI 03/10/2018    Craig [name] Active Problem List   Diagnosis Date Noted  . Septic arthritis ([place]) 03/25/2021  . Herpes zoster without complication 53/61/4431  . Atopic dermatitis 12/30/2017  . Obesity (BMI 30.0-34.9) 08/22/2017  . Diastasis recti 01/24/2017  . Type 2 diabetes mellitus without complication, with long-term current use of insulin ([place]) 01/25/2016  . Allergic rhinitis 01/25/2016  . Elevated INR   . HIV infection ([place]) 03/08/2014  . History of pulmonary embolus (PE) 03/08/2014  . History of DVT (deep vein thrombosis) 03/08/2014  . Long term current use of anticoagulant therapy 02/26/2014  . Coronary atherosclerosis of unspecified type of vessel, native or graft 02/26/2014  . Dermatitis 02/26/2014  . Depressive disorder, not elsewhere classified 02/26/2014  . Asthma, chronic 02/26/2014  . Coronary atherosclerosis 02/26/2014  . Clinical depression 02/26/2014  . Chronic diastolic heart failure ([place]) 04/09/2013  . Esophageal reflux 04/09/2013  . Essential hypertension 04/09/2013  . Gout 04/09/2013  . 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 Chronic pulmonary heart disease ([place]) 04/09/2013  . Congestive heart failure ([place]) 04/09/2013  . Convulsions ([place]) 04/09/2013  . Diastolic heart failure ([place]) 04/09/2013  . Allergy to latex 04/09/2013  . Inguinal hernia 04/09/2013  . 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 Dysplasia of anus 12/29/2012  . Molluscum contagiosum 09/29/2012  .  Cytomegalovirus infection ([place]) 02/13/2012  . Horseshoe tear of retina 02/13/2012  . Airway hyperreactivity 09/08/2010  . Hereditary and idiopathic peripheral neuropathy 09/08/2010    Past Surgical History:  Procedure Laterality Date  . COLONOSCOPY    . I & D EXTREMITY Right 07/28/2015   Procedure: IRRIGATION AND DEBRIDEMENT WRIST;  Surgeon: Dayna Barker, MD;  Location: [facility];  Service: Plastics;  Laterality: Right;        No family history on file.  Social History   Tobacco Use  . Smoking status: Never Smoker  . Smokeless tobacco: Never Used  Vaping Use  . Vaping Use: Never used  Substance Use Topics  . Alcohol use: No    Alcohol/week: 0.0 standard drinks  . Drug use: No    Home Medications Prior to Admission medications   Medication Sig Start Date End Date Taking? Authorizing Provider  albuterol (PROVENTIL HFA;VENTOLIN HFA) 108 (90 BASE) MCG/ACT inhaler Inhale 2 puffs into the lungs every 6 (six) hours as needed for wheezing or shortness of breath.   Yes [provider]  atorvastatin (LIPITOR) 40 MG tablet Take 40 mg by mouth daily.   Yes [provider]  bictegravir-emtricitabine-tenofovir AF (BIKTARVY) 50-200-25 MG TABS tablet Take 1 tablet by mouth daily. 07/21/20  Yes [place] Callas, NP  colchicine 0.6 MG tablet Take 1 tablet (0.6 mg total) by mouth daily. 05/22/18  Yes Rancour, Annie Main, MD  DUPIXENT 300 MG/2ML prefilled syringe  03/28/20  Yes [provider]  glipiZIDE (GLUCOTROL XL) 2.5 MG 24 hr tablet Take 2.5 mg by mouth. 12/22/15  Yes [provider]  hydrOXYzine (ATARAX/VISTARIL) 25 MG tablet Take 1 tablet (25 mg total) by mouth at bedtime as needed. 07/21/20  Yes [place] Callas, NP  insulin lispro (HUMALOG) 100 UNIT/ML KwikPen Inject into the skin. 06/09/20  Yes [provider]  LANTUS SOLOSTAR 100 UNIT/ML Solostar Pen Inject into the skin. 06/09/20  Yes [provider]  lisinopril (PRINIVIL,ZESTRIL) 2.5 MG tablet Take 2.5 mg by mouth daily.   Yes [provider]  Semaglutide (RYBELSUS) 14 MG TABS Take 14 mg by mouth daily. 06/30/20  Yes [provider]  sertraline (ZOLOFT) 50 MG tablet Take 50 mg by mouth daily. 03/15/20 04/22/21 Yes [provider]  traMADol (ULTRAM) 50 MG tablet Take 1 tablet (50 mg total) by mouth every 6 (six) hours as needed. 09/28/20  Yes Raylene Everts, MD  triamcinolone  ointment (KENALOG) 0.1 % Apply topically 2 (two) times daily. 07/04/20  Yes [provider]  [place] test strip  07/16/17   [provider]  Alcohol Swabs (ALCOHOL PREP) 70 % PADS  07/16/17   [provider]  ASSURE COMFORT LANCETS 30G [place]  07/16/17   [provider]    Allergies    Penicillins, Shellfish allergy, Latex, and Metformin  Review of Systems   Review of Systems  Constitutional: Negative for chills and fever.  HENT: Negative for ear pain and sore throat.   Eyes: Negative for pain and visual disturbance.  Respiratory: Negative for cough and shortness of breath.   Cardiovascular: Negative for chest pain and palpitations.  Gastrointestinal: Negative for abdominal pain and vomiting.  Genitourinary: Negative for dysuria and hematuria.  Musculoskeletal: Positive for arthralgias. Negative for back pain.  Skin: Negative for color change and rash.  Neurological: Negative for seizures and syncope.  All other systems reviewed and are negative.   Physical Exam Updated Vital Signs BP 123/73   Pulse Marland Kitchen)  118   Temp (!) 102 F (38.9 C) (Oral)   Resp 17   SpO2 96%   Physical Exam Vitals and nursing note reviewed.  Constitutional:      Appearance: He is well-developed.  HENT:     Head: Normocephalic and atraumatic.  Eyes:     Extraocular Movements: Extraocular movements intact.     Conjunctiva/sclera: Conjunctivae normal.  Cardiovascular:     Rate and Rhythm: Normal rate and regular rhythm.  Pulmonary:     Effort: Pulmonary effort is normal.     Breath sounds: Normal breath sounds.  Abdominal:     Palpations: Abdomen is soft.     Tenderness: There is no abdominal tenderness.  Musculoskeletal:     Cervical back: Neck supple.     Comments: Right wrist with edema on dorsal surface. Limited range of motion secondary to pain of the right wrist. Craig [name] able to open and close fist without difficulty.  Sensation intact over the  right hand.  Strong radial pulse.  No overlying skin changes.  Skin:    General: Skin is warm and dry.  Neurological:     General: No focal deficit present.     Mental Status: He is alert and oriented to person, place, and time.     ED Results / Procedures / Treatments   Labs (all labs ordered are listed, but only abnormal results are displayed) Labs Reviewed  BASIC METABOLIC PANEL - Abnormal; Notable for the following components:      Result Value   Potassium 3.4 (*)    Glucose, Bld 127 (*)    Creatinine, Ser 1.30 (*)    All other components within normal limits  SEDIMENTATION RATE - Abnormal; Notable for the following components:   Sed Rate 50 (*)    All other components within normal limits  C-REACTIVE PROTEIN - Abnormal; Notable for the following components:   CRP 3.9 (*)    All other components within normal limits  PROTIME-INR - Abnormal; Notable for the following components:   Prothrombin Time 16.9 (*)    INR 1.4 (*)    All other components within normal limits  RESP PANEL BY RT-PCR (FLU A&B, COVID) ARPGX2  BODY FLUID CULTURE  BODY FLUID CULTURE W GRAM STAIN  CULTURE, BLOOD (ROUTINE X 2)  CULTURE, BLOOD (ROUTINE X 2)  CBC WITH DIFFERENTIAL/PLATELET  SYNOVIAL CELL COUNT + DIFF, W/ CRYSTALS  COMPREHENSIVE METABOLIC PANEL  LACTIC ACID, PLASMA  LACTIC ACID, PLASMA  HEMOGLOBIN A1C    EKG None  Radiology DG Wrist Complete Right  Result Date: 03/25/2021 CLINICAL DATA:  Work injury EXAM: RIGHT WRIST - COMPLETE 3+ VIEW COMPARISON:  None. FINDINGS: No distal radius or ulnar fracture. Radiocarpal joint is intact. No carpal fracture. No soft tissue abnormality. IMPRESSION: No fracture or dislocation. Electronically Signed   By: Suzy Bouchard M.D.   On: 03/25/2021 12:26   DG Wrist Complete Right  Result Date: 03/25/2021 CLINICAL DATA:  Right wrist and forearm pain and swelling, no known injury. History of gout. EXAM: RIGHT WRIST - COMPLETE 3+ VIEW COMPARISON:  None.  FINDINGS: No acute fracture or malalignment. No evidence of erosive changes or other evidence of inflammatory arthropathy. A small 2-3 mm radiopaque foreign body is present in the superficial soft tissues of the dorsal mid forearm overlying the ulna. Marked soft tissue swelling is present along the dorsal aspect of the wrist joint. IMPRESSION: 1. Soft tissue swelling over the dorsal aspect of the wrist without evidence of fracture,  malalignment or erosive bony changes. 2. Small 3 mm radiopaque foreign body in the superficial soft tissues of the dorsal mid forearm overlying the ulna. Electronically Signed   By: Jacqulynn Cadet M.D.   On: 03/25/2021 10:58   DG Chest Portable 1 View  Result Date: 03/25/2021 CLINICAL DATA:  Fever EXAM: PORTABLE CHEST 1 VIEW COMPARISON:  06/17/2016 FINDINGS: Linear scarring at the left base. No confluent opacities. Heart is normal size. No effusions or acute bony abnormality. IMPRESSION: No active disease. Electronically Signed   By: Rolm Baptise M.D.   On: 03/25/2021 22:21    Procedures .Joint Aspiration/Arthrocentesis  Date/Time: 03/25/2021 8:21 PM Performed by: Mckenzee Beem, [place], MD Authorized by: Carmin Muskrat, MD   Consent:    Consent obtained:  Verbal   Consent given by:  Craig [name]   Risks, benefits, and alternatives were discussed: yes     Risks discussed:  Bleeding, incomplete drainage, pain and infection   Alternatives discussed:  Alternative treatment Universal protocol:    Craig [name] identity confirmed:  Verbally with Craig [name] and arm band Location:    Location:  Wrist   Wrist:  R radiocarpal Anesthesia:    Anesthesia method:  Local infiltration   Local anesthetic:  Lidocaine 1% w/o epi Procedure details:    Preparation: Craig [name] was prepped and draped in usual sterile fashion     Needle gauge:  22 G   Ultrasound guidance: no     Approach:  Anterior   Aspirate amount:  33mL   Aspirate characteristics:  Blood-tinged and yellow   Steroid injected: no      Specimen collected: yes   Post-procedure details:    Dressing:  Adhesive bandage   Procedure completion:  Tolerated     Medications Ordered in ED Medications  cefTRIAXone (ROCEPHIN) 1 g in sodium chloride 0.9 % 100 mL IVPB (1 g Intravenous New Bag/Given 03/25/21 2314)  lactated ringers bolus 1,000 mL (has no administration in time range)  vancomycin (VANCOREADY) IVPB 2000 mg/400 mL (has no administration in time range)  bictegravir-emtricitabine-tenofovir AF (BIKTARVY) 50-200-25 MG per tablet 1 tablet (has no administration in time range)  atorvastatin (LIPITOR) tablet 40 mg (has no administration in time range)  hydrOXYzine (ATARAX/VISTARIL) tablet 25 mg (has no administration in time range)  sertraline (ZOLOFT) tablet 50 mg (has no administration in time range)  insulin aspart (novoLOG) injection 0-6 Units (has no administration in time range)  acetaminophen (TYLENOL) tablet 650 mg (has no administration in time range)    Or  acetaminophen (TYLENOL) suppository 650 mg (has no administration in time range)  oxyCODONE (Oxy IR/ROXICODONE) immediate release tablet 5 mg (has no administration in time range)  oxyCODONE-acetaminophen (PERCOCET/ROXICET) 5-325 MG per tablet 1 tablet (1 tablet Oral Given 03/25/21 1212)  lidocaine (PF) (XYLOCAINE) 1 % injection 10 mL (1 mL Intradermal Given 03/25/21 2021)  fentaNYL (SUBLIMAZE) injection 50 mcg (50 mcg Intravenous Given 03/25/21 2018)  acetaminophen (TYLENOL) tablet 1,000 mg (1,000 mg Oral Given 03/25/21 2017)    ED Course  I have reviewed the triage vital signs and the nursing notes.  Pertinent labs & imaging results that were available during my care of the Craig [name] were reviewed by me and considered in my medical decision making (see chart for details).    MDM Rules/Calculators/A&P                          Craig [name] is a 60 year old male with a history of gout, HIV, on Coumadin or  Xarelto for DVT prophylaxis who presents with right wrist  pain.  Given Craig [name]'s presenting symptoms and medical history, concern for septic arthritis, hemarthrosis, versus gout flare.  Will wait for Craig [name]'s INR to return to risk stratify for potential arthrocentesis of the wrist.  INR 1.4. Will proceed with arthrocentesis.  Arthrocentesis performed as above. Fluid sent.  Craig [name] developed fever and remained tachycardic while in ED. Clinical suspicion for septic arthritis increased. Will start antibiotics, give LR bolus, order CXR, and COVID test.   ESR and CRP elevated. Will correlate with joint fluid analysis. Given high suspicion for septic arthritis, medicine contacted for admission. Will continue IV abx and follow up joint fluid analysis.   Final Clinical Impression(s) / ED Diagnoses Final diagnoses:  Septic arthritis [facility])    Rx / [place] Orders ED Discharge Orders    None       Chaniece Barbato, [place], MD 03/25/21 1753    Carmin Muskrat, MD 03/29/21 1038

## 2021-03-26 ENCOUNTER — Observation Stay (HOSPITAL_COMMUNITY): Payer: Medicare Other

## 2021-03-26 ENCOUNTER — Observation Stay (HOSPITAL_COMMUNITY): Payer: Medicare Other | Admitting: Certified Registered Nurse Anesthetist

## 2021-03-26 ENCOUNTER — Encounter (HOSPITAL_COMMUNITY)
Admission: EM | Disposition: A | Discharge status: Home / Self Care | Payer: Self-pay | Source: Home / Self Care | Attending: Family Medicine

## 2021-03-26 DIAGNOSIS — I110 Hypertensive heart disease with heart failure: Secondary | ICD-10-CM | POA: Diagnosis present

## 2021-03-26 DIAGNOSIS — Z794 Long term (current) use of insulin: Secondary | ICD-10-CM | POA: Diagnosis not present

## 2021-03-26 DIAGNOSIS — A419 Sepsis, unspecified organism: Secondary | ICD-10-CM | POA: Diagnosis present

## 2021-03-26 DIAGNOSIS — M659 Synovitis and tenosynovitis, unspecified: Secondary | ICD-10-CM | POA: Diagnosis not present

## 2021-03-26 DIAGNOSIS — E1141 Type 2 diabetes mellitus with diabetic mononeuropathy: Secondary | ICD-10-CM | POA: Diagnosis present

## 2021-03-26 DIAGNOSIS — M109 Gout, unspecified: Secondary | ICD-10-CM | POA: Diagnosis present

## 2021-03-26 DIAGNOSIS — Z888 Allergy status to other drugs, medicaments and biological substances status: Secondary | ICD-10-CM | POA: Diagnosis not present

## 2021-03-26 DIAGNOSIS — I5032 Chronic diastolic (congestive) heart failure: Secondary | ICD-10-CM | POA: Diagnosis present

## 2021-03-26 DIAGNOSIS — Z79899 Other long term (current) drug therapy: Secondary | ICD-10-CM | POA: Diagnosis not present

## 2021-03-26 DIAGNOSIS — Z20822 Contact with and (suspected) exposure to covid-19: Secondary | ICD-10-CM | POA: Diagnosis present

## 2021-03-26 DIAGNOSIS — G479 Sleep disorder, unspecified: Secondary | ICD-10-CM | POA: Diagnosis present

## 2021-03-26 DIAGNOSIS — F39 Unspecified mood [affective] disorder: Secondary | ICD-10-CM | POA: Diagnosis present

## 2021-03-26 DIAGNOSIS — M25531 Pain in right wrist: Secondary | ICD-10-CM | POA: Diagnosis present

## 2021-03-26 DIAGNOSIS — Z880 Allergy status to penicillin: Secondary | ICD-10-CM | POA: Diagnosis not present

## 2021-03-26 DIAGNOSIS — M65131 Other infective (teno)synovitis, right wrist: Secondary | ICD-10-CM | POA: Diagnosis present

## 2021-03-26 DIAGNOSIS — I252 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 infarction: Secondary | ICD-10-CM | POA: Diagnosis not present

## 2021-03-26 DIAGNOSIS — B20 Human immunodeficiency virus [HIV] disease: Secondary | ICD-10-CM | POA: Diagnosis present

## 2021-03-26 DIAGNOSIS — K219 Gastro-esophageal reflux disease without esophagitis: Secondary | ICD-10-CM | POA: Diagnosis present

## 2021-03-26 DIAGNOSIS — Z91040 Latex allergy status: Secondary | ICD-10-CM | POA: Diagnosis not present

## 2021-03-26 DIAGNOSIS — Z7984 Long term (current) use of oral hypoglycemic drugs: Secondary | ICD-10-CM | POA: Diagnosis not present

## 2021-03-26 DIAGNOSIS — D6859 Other primary thrombophilia: Secondary | ICD-10-CM

## 2021-03-26 DIAGNOSIS — Z86711 Personal history of pulmonary embolism: Secondary | ICD-10-CM | POA: Diagnosis not present

## 2021-03-26 DIAGNOSIS — N179 Acute kidney failure, unspecified: Secondary | ICD-10-CM | POA: Diagnosis present

## 2021-03-26 DIAGNOSIS — Z86718 Personal history of other venous thrombosis and embolism: Secondary | ICD-10-CM | POA: Diagnosis not present

## 2021-03-26 DIAGNOSIS — M009 Pyogenic arthritis, unspecified: Secondary | ICD-10-CM | POA: Diagnosis present

## 2021-03-26 DIAGNOSIS — I2510 Atherosclerotic heart disease of native coronary artery without angina pectoris: Secondary | ICD-10-CM | POA: Diagnosis present

## 2021-03-26 DIAGNOSIS — E785 Hyperlipidemia, unspecified: Secondary | ICD-10-CM | POA: Diagnosis present

## 2021-03-26 HISTORY — PX: I & D EXTREMITY: SHX5045

## 2021-03-26 LAB — CBC
HCT: 37.6 % — ABNORMAL LOW (ref 39.0–52.0)
Hemoglobin: 12.3 g/dL — ABNORMAL LOW (ref 13.0–17.0)
MCH: 29 pg (ref 26.0–34.0)
MCHC: 32.7 g/dL (ref 30.0–36.0)
MCV: 88.7 fL (ref 80.0–100.0)
Platelets: 233 10*3/uL (ref 150–400)
RBC: 4.24 MIL/uL (ref 4.22–5.81)
RDW: 14.6 % (ref 11.5–15.5)
WBC: 9.1 10*3/uL (ref 4.0–10.5)
nRBC: 0 % (ref 0.0–0.2)

## 2021-03-26 LAB — GLUCOSE, CAPILLARY
Glucose-Capillary: 95 mg/dL (ref 70–99)
Glucose-Capillary: 78 mg/dL (ref 70–99)
Glucose-Capillary: 63 mg/dL — ABNORMAL LOW (ref 70–99)
Glucose-Capillary: 107 mg/dL — ABNORMAL HIGH (ref 70–99)
Glucose-Capillary: 94 mg/dL (ref 70–99)
Glucose-Capillary: 99 mg/dL (ref 70–99)

## 2021-03-26 LAB — IRON AND TIBC
Iron: 23 ug/dL — ABNORMAL LOW (ref 45–182)
Saturation Ratios: 8 % — ABNORMAL LOW (ref 17.9–39.5)
TIBC: 297 ug/dL (ref 250–450)
UIBC: 274 ug/dL

## 2021-03-26 LAB — COMPREHENSIVE METABOLIC PANEL
ALT: 18 U/L (ref 0–44)
AST: 17 U/L (ref 15–41)
Albumin: 2.8 g/dL — ABNORMAL LOW (ref 3.5–5.0)
Alkaline Phosphatase: 71 U/L (ref 38–126)
Anion gap: 6 (ref 5–15)
BUN: 9 mg/dL (ref 6–20)
CO2: 24 mmol/L (ref 22–32)
Calcium: 8.6 mg/dL — ABNORMAL LOW (ref 8.9–10.3)
Chloride: 106 mmol/L (ref 98–111)
Creatinine, Ser: 1.14 mg/dL (ref 0.61–1.24)
GFR, Estimated: >60 mL/min (ref >60)
Glucose, Bld: 118 mg/dL — ABNORMAL HIGH (ref 70–99)
Potassium: 3.7 mmol/L (ref 3.5–5.1)
Sodium: 136 mmol/L (ref 135–145)
Total Bilirubin: 0.8 mg/dL (ref 0.3–1.2)
Total Protein: 7.9 g/dL (ref 6.5–8.1)

## 2021-03-26 LAB — SURGICAL PCR SCREEN
MRSA, PCR: NEGATIVE
Staphylococcus aureus: POSITIVE — AB

## 2021-03-26 LAB — LACTIC ACID, PLASMA: Lactic Acid, Venous: 0.7 mmol/L (ref 0.5–1.9)

## 2021-03-26 LAB — FERRITIN: Ferritin: 65 ng/mL (ref 24–336)

## 2021-03-26 SURGERY — IRRIGATION AND DEBRIDEMENT EXTREMITY
Anesthesia: General | Site: Wrist | Laterality: Right

## 2021-03-26 MED ORDER — ACETAMINOPHEN 10 MG/ML IV SOLN
INTRAVENOUS | Status: DC | PRN
  Administered 2021-03-26: 1000 mg via INTRAVENOUS

## 2021-03-26 MED ORDER — DEXTROSE 50 % IV SOLN
12.5000 g | INTRAVENOUS | Status: AC
Start: 2021-03-26 — End: 2021-03-26
  Filled 2021-03-26: qty 50

## 2021-03-26 MED ORDER — TRAMADOL HCL 50 MG PO TABS
50.0000 mg | ORAL_TABLET | Freq: Four times a day (QID) | ORAL | Status: DC
  Administered 2021-03-26 – 2021-03-28 (×5): 50 mg via ORAL
  Filled 2021-03-26 (×5): qty 1

## 2021-03-26 MED ORDER — CHLORHEXIDINE GLUCONATE CLOTH 2 % EX PADS: 6.0000 | MEDICATED_PAD | Freq: Every day | CUTANEOUS | Status: DC

## 2021-03-26 MED ORDER — POLYETHYLENE GLYCOL 3350 17 G PO PACK: 17.0000 g | PACK | Freq: Every day | ORAL | Status: DC | PRN

## 2021-03-26 MED ORDER — LIDOCAINE 2% (20 MG/ML) 5 ML SYRINGE
INTRAMUSCULAR | Status: DC | PRN
  Administered 2021-03-26: 60 mg via INTRAVENOUS

## 2021-03-26 MED ORDER — GABAPENTIN 300 MG PO CAPS: 300.0000 mg | ORAL_CAPSULE | Freq: Once | ORAL | Status: DC

## 2021-03-26 MED ORDER — ACETAMINOPHEN 325 MG PO TABS: 325.0000 mg | ORAL_TABLET | Freq: Four times a day (QID) | ORAL | Status: DC | PRN

## 2021-03-26 MED ORDER — CHLORHEXIDINE GLUCONATE 4 % EX LIQD
60.0000 mL | Freq: Once | CUTANEOUS | Status: AC
Start: 2021-03-26 — End: 2021-03-26
  Administered 2021-03-26: 4 via TOPICAL
  Filled 2021-03-26: qty 60

## 2021-03-26 MED ORDER — VANCOMYCIN HCL 1000 MG IV SOLR
INTRAVENOUS | Status: AC
  Filled 2021-03-26: qty 1000

## 2021-03-26 MED ORDER — SODIUM CHLORIDE 0.9 % IV SOLN
2.0000 g | INTRAVENOUS | Status: DC
  Administered 2021-03-26 – 2021-03-28 (×3): 2 g via INTRAVENOUS
  Filled 2021-03-26 (×3): qty 20

## 2021-03-26 MED ORDER — MUPIROCIN 2 % EX OINT
1.0000 "application " | TOPICAL_OINTMENT | Freq: Two times a day (BID) | CUTANEOUS | Status: DC
  Administered 2021-03-26 – 2021-03-28 (×4): 1 via NASAL
  Filled 2021-03-26 (×4): qty 22

## 2021-03-26 MED ORDER — SODIUM CHLORIDE 0.9 % IR SOLN
Status: DC | PRN
  Administered 2021-03-26: 3000 mL

## 2021-03-26 MED ORDER — LACTATED RINGERS IV SOLN: INTRAVENOUS | Status: DC

## 2021-03-26 MED ORDER — ONDANSETRON HCL 4 MG/2ML IJ SOLN
INTRAMUSCULAR | Status: DC | PRN
  Administered 2021-03-26: 4 mg via INTRAVENOUS

## 2021-03-26 MED ORDER — DOCUSATE SODIUM 100 MG PO CAPS
100.0000 mg | ORAL_CAPSULE | Freq: Two times a day (BID) | ORAL | Status: DC
  Administered 2021-03-26 – 2021-03-28 (×4): 100 mg via ORAL
  Filled 2021-03-26 (×4): qty 1

## 2021-03-26 MED ORDER — DIPHENHYDRAMINE HCL 25 MG PO CAPS
25.0000 mg | ORAL_CAPSULE | Freq: Four times a day (QID) | ORAL | Status: DC | PRN
Start: 2021-03-26 — End: 2021-03-29
  Administered 2021-03-27: 50 mg via ORAL
  Filled 2021-03-26: qty 2

## 2021-03-26 MED ORDER — RIVAROXABAN 20 MG PO TABS
20.0000 mg | ORAL_TABLET | Freq: Every day | ORAL | Status: DC
  Administered 2021-03-27 – 2021-03-28 (×2): 20 mg via ORAL
  Filled 2021-03-26 (×2): qty 1

## 2021-03-26 MED ORDER — ADULT MULTIVITAMIN W/MINERALS CH
1.0000 | ORAL_TABLET | Freq: Every day | ORAL | Status: DC
  Administered 2021-03-26 – 2021-03-28 (×3): 1 via ORAL
  Filled 2021-03-26 (×3): qty 1

## 2021-03-26 MED ORDER — PHENYLEPHRINE HCL (PRESSORS) 10 MG/ML IV SOLN
INTRAVENOUS | Status: DC | PRN
  Administered 2021-03-26: 40 ug via INTRAVENOUS

## 2021-03-26 MED ORDER — DEXAMETHASONE SODIUM PHOSPHATE 10 MG/ML IJ SOLN
INTRAMUSCULAR | Status: DC | PRN
  Administered 2021-03-26: 10 mg via INTRAVENOUS

## 2021-03-26 MED ORDER — ONDANSETRON HCL 4 MG PO TABS: 4.0000 mg | ORAL_TABLET | Freq: Four times a day (QID) | ORAL | Status: DC | PRN

## 2021-03-26 MED ORDER — HYDROCODONE-ACETAMINOPHEN 7.5-325 MG PO TABS: 1.0000 | ORAL_TABLET | ORAL | Status: DC | PRN

## 2021-03-26 MED ORDER — 0.9 % SODIUM CHLORIDE (POUR BTL) OPTIME
TOPICAL | Status: DC | PRN
  Administered 2021-03-26: 1000 mL

## 2021-03-26 MED ORDER — MELOXICAM 7.5 MG PO TABS: 15.0000 mg | ORAL_TABLET | Freq: Once | ORAL | Status: DC

## 2021-03-26 MED ORDER — MAGNESIUM CITRATE PO SOLN: 1.0000 | Freq: Once | ORAL | Status: DC | PRN

## 2021-03-26 MED ORDER — POVIDONE-IODINE 10 % EX SWAB: 2.0000 "application " | Freq: Once | CUTANEOUS | Status: DC

## 2021-03-26 MED ORDER — CHLORHEXIDINE GLUCONATE 0.12 % MT SOLN: 15.0000 mL | Freq: Once | OROMUCOSAL | Status: AC

## 2021-03-26 MED ORDER — PANTOPRAZOLE SODIUM 40 MG PO TBEC: 40.0000 mg | DELAYED_RELEASE_TABLET | Freq: Two times a day (BID) | ORAL | Status: DC | PRN

## 2021-03-26 MED ORDER — ORAL CARE MOUTH RINSE: 15.0000 mL | Freq: Once | OROMUCOSAL | Status: AC

## 2021-03-26 MED ORDER — PROPOFOL 10 MG/ML IV BOLUS
INTRAVENOUS | Status: DC | PRN
  Administered 2021-03-26: 200 mg via INTRAVENOUS

## 2021-03-26 MED ORDER — HYDROCODONE-ACETAMINOPHEN 5-325 MG PO TABS
1.0000 | ORAL_TABLET | ORAL | Status: DC | PRN
  Administered 2021-03-28: 1 via ORAL
  Filled 2021-03-26: qty 1

## 2021-03-26 MED ORDER — ACETAMINOPHEN 500 MG PO TABS
500.0000 mg | ORAL_TABLET | Freq: Four times a day (QID) | ORAL | Status: AC
  Administered 2021-03-26 – 2021-03-27 (×4): 500 mg via ORAL
  Filled 2021-03-26 (×4): qty 1

## 2021-03-26 MED ORDER — FENTANYL CITRATE (PF) 250 MCG/5ML IJ SOLN
INTRAMUSCULAR | Status: DC | PRN
  Administered 2021-03-26 (×4): 25 ug via INTRAVENOUS
  Administered 2021-03-26: 50 ug via INTRAVENOUS

## 2021-03-26 MED ORDER — BUPIVACAINE HCL (PF) 0.25 % IJ SOLN
INTRAMUSCULAR | Status: AC
  Filled 2021-03-26: qty 30

## 2021-03-26 MED ORDER — WHITE PETROLATUM EX OINT
TOPICAL_OINTMENT | CUTANEOUS | Status: AC
  Administered 2021-03-26: 0.2
  Filled 2021-03-26: qty 28.35

## 2021-03-26 MED ORDER — MIDAZOLAM HCL 2 MG/2ML IJ SOLN
INTRAMUSCULAR | Status: DC | PRN
  Administered 2021-03-26: 2 mg via INTRAVENOUS

## 2021-03-26 MED ORDER — METHOCARBAMOL 1000 MG/10ML IJ SOLN
500.0000 mg | Freq: Four times a day (QID) | INTRAVENOUS | Status: DC | PRN
  Filled 2021-03-26: qty 5

## 2021-03-26 MED ORDER — METHOCARBAMOL 500 MG PO TABS: 500.0000 mg | ORAL_TABLET | Freq: Four times a day (QID) | ORAL | Status: DC | PRN

## 2021-03-26 MED ORDER — PROPOFOL 10 MG/ML IV BOLUS
INTRAVENOUS | Status: AC
  Filled 2021-03-26: qty 20

## 2021-03-26 MED ORDER — MIDAZOLAM HCL 2 MG/2ML IJ SOLN
INTRAMUSCULAR | Status: AC
  Filled 2021-03-26: qty 2

## 2021-03-26 MED ORDER — FENTANYL CITRATE (PF) 250 MCG/5ML IJ SOLN
INTRAMUSCULAR | Status: AC
  Filled 2021-03-26: qty 5

## 2021-03-26 MED ORDER — DEXTROSE 50 % IV SOLN
INTRAVENOUS | Status: AC
  Administered 2021-03-26: 12.5 g via INTRAVENOUS
  Filled 2021-03-26: qty 50

## 2021-03-26 MED ORDER — MORPHINE SULFATE (PF) 2 MG/ML IV SOLN: 0.5000 mg | INTRAVENOUS | Status: DC | PRN

## 2021-03-26 MED ORDER — CHLORHEXIDINE GLUCONATE 0.12 % MT SOLN
OROMUCOSAL | Status: AC
  Administered 2021-03-26: 15 mL via OROMUCOSAL
  Filled 2021-03-26: qty 15

## 2021-03-26 MED ORDER — ONDANSETRON HCL 4 MG/2ML IJ SOLN: 4.0000 mg | Freq: Four times a day (QID) | INTRAMUSCULAR | Status: DC | PRN

## 2021-03-26 MED ORDER — BISACODYL 10 MG RE SUPP: 10.0000 mg | Freq: Every day | RECTAL | Status: DC | PRN

## 2021-03-26 SURGICAL SUPPLY — 64 items
BANDAGE ESMARK 6X9 LF (GAUZE/BANDAGES/DRESSINGS) IMPLANT
BLADE SURG 10 STRL SS (BLADE) ×1 IMPLANT
BNDG COHESIVE 4X5 TAN STRL (GAUZE/BANDAGES/DRESSINGS) ×1 IMPLANT
BNDG ELASTIC 4X5.8 VLCR STR LF (GAUZE/BANDAGES/DRESSINGS) ×2 IMPLANT
BNDG ELASTIC 6X5.8 VLCR STR LF (GAUZE/BANDAGES/DRESSINGS) ×2 IMPLANT
BNDG ESMARK 4X9 LF (GAUZE/BANDAGES/DRESSINGS) ×1 IMPLANT
BNDG ESMARK 6X9 LF (GAUZE/BANDAGES/DRESSINGS)
BNDG GAUZE ELAST 4 BULKY (GAUZE/BANDAGES/DRESSINGS) ×1 IMPLANT
CNTNR URN SCR LID CUP LEK RST (MISCELLANEOUS) IMPLANT
CONT SPEC 4OZ STRL OR WHT (MISCELLANEOUS)
COVER SURGICAL LIGHT HANDLE (MISCELLANEOUS) ×2 IMPLANT
COVER WAND RF STERILE (DRAPES) ×2 IMPLANT
CUFF TOURN SGL LL 12 NO SLV (MISCELLANEOUS) IMPLANT
CUFF TOURN SGL QUICK 34 (TOURNIQUET CUFF)
CUFF TRNQT CYL 34X4.125X (TOURNIQUET CUFF) IMPLANT
DRAPE SURG 17X23 STRL (DRAPES) ×1 IMPLANT
DRAPE U-SHAPE 47X51 STRL (DRAPES) IMPLANT
DRSG EMULSION OIL 3X3 NADH (GAUZE/BANDAGES/DRESSINGS) ×1 IMPLANT
DRSG PAD ABDOMINAL 8X10 ST (GAUZE/BANDAGES/DRESSINGS) ×2 IMPLANT
DURAPREP 26ML APPLICATOR (WOUND CARE) ×1 IMPLANT
ELECT REM PT RETURN 9FT ADLT (ELECTROSURGICAL) ×2
ELECTRODE REM PT RTRN 9FT ADLT (ELECTROSURGICAL) IMPLANT
EVACUATOR 1/8 PVC DRAIN (DRAIN) IMPLANT
FACESHIELD WRAPAROUND (MASK) ×2 IMPLANT
FACESHIELD WRAPAROUND OR TEAM (MASK) ×1 IMPLANT
GAUZE SPONGE 4X4 12PLY STRL (GAUZE/BANDAGES/DRESSINGS) ×2 IMPLANT
GAUZE SPONGE 4X4 12PLY STRL LF (GAUZE/BANDAGES/DRESSINGS) ×1 IMPLANT
GAUZE XEROFORM 1X8 LF (GAUZE/BANDAGES/DRESSINGS) ×1 IMPLANT
GLOVE BIO SURGEON STRL SZ7.5 (GLOVE) ×1 IMPLANT
GLOVE BIOGEL PI IND STRL 7.5 (GLOVE) ×1 IMPLANT
GLOVE BIOGEL PI INDICATOR 7.5 (GLOVE) ×1
GLOVE SRG 8 PF TXTR STRL LF DI (GLOVE) ×1 IMPLANT
GLOVE SURG SYN 7.5  E (GLOVE) ×1
GLOVE SURG SYN 7.5 E (GLOVE) ×1 IMPLANT
GLOVE SURG SYN 7.5 PF PI (GLOVE) ×1 IMPLANT
GLOVE SURG UNDER POLY LF SZ8 (GLOVE) ×1
GOWN STRL REUS W/ TWL LRG LVL3 (GOWN DISPOSABLE) ×2 IMPLANT
GOWN STRL REUS W/ TWL XL LVL3 (GOWN DISPOSABLE) ×2 IMPLANT
GOWN STRL REUS W/TWL LRG LVL3 (GOWN DISPOSABLE) ×3
GOWN STRL REUS W/TWL XL LVL3 (GOWN DISPOSABLE) ×2
HANDPIECE INTERPULSE COAX TIP (DISPOSABLE)
KIT BASIN OR (CUSTOM PROCEDURE TRAY) ×2 IMPLANT
KIT TURNOVER KIT B (KITS) ×2 IMPLANT
MANIFOLD NEPTUNE II (INSTRUMENTS) ×2 IMPLANT
NDL HYPO 25GX1X1/2 BEV (NEEDLE) IMPLANT
NEEDLE HYPO 25GX1X1/2 BEV (NEEDLE) IMPLANT
NS IRRIG 1000ML POUR BTL (IV SOLUTION) ×2 IMPLANT
PACK ORTHO EXTREMITY (CUSTOM PROCEDURE TRAY) ×2 IMPLANT
PAD ABD 7.5X8 STRL (GAUZE/BANDAGES/DRESSINGS) ×1 IMPLANT
PAD ARMBOARD 7.5X6 YLW CONV (MISCELLANEOUS) ×4 IMPLANT
SET CYSTO W/LG BORE CLAMP LF (SET/KITS/TRAYS/PACK) ×1 IMPLANT
SET HNDPC FAN SPRY TIP SCT (DISPOSABLE) IMPLANT
SPONGE LAP 18X18 RF (DISPOSABLE) IMPLANT
STOCKINETTE IMPERVIOUS 9X36 MD (GAUZE/BANDAGES/DRESSINGS) ×3 IMPLANT
SUT ETHILON 3 0 PS 1 (SUTURE) ×1 IMPLANT
SUT PDS AB 2-0 CT1 27 (SUTURE) ×1 IMPLANT
SWAB CULTURE ESWAB REG 1ML (MISCELLANEOUS) IMPLANT
SYR CONTROL 10ML LL (SYRINGE) IMPLANT
SYSTEM CHEST DRAIN TLS 7FR (DRAIN) ×1 IMPLANT
TOWEL GREEN STERILE (TOWEL DISPOSABLE) ×2 IMPLANT
TOWEL GREEN STERILE FF (TOWEL DISPOSABLE) ×2 IMPLANT
TUBE CONNECTING 12X1/4 (SUCTIONS) ×2 IMPLANT
UNDERPAD 30X36 HEAVY ABSORB (UNDERPADS AND DIAPERS) ×2 IMPLANT
YANKAUER SUCT BULB TIP NO VENT (SUCTIONS) ×2 IMPLANT

## 2021-03-26 NOTE — Plan of Care (Signed)
  Problem: Pain Managment: Goal: General experience of comfort will improve Outcome: Progressing   Problem: Elimination: Goal: Will not experience complications related to urinary retention Outcome: Progressing   

## 2021-03-26 NOTE — Consult Note (Signed)
ORTHOPAEDIC CONSULTATION  REQUESTING PHYSICIAN: Lenoria Chime, MD  Chief Complaint: right wrist pain and swelling  HPI: Craig [name] is a 60 y.o. male with a history of HIV+, gout, HTN, DVT, HFpEF, and DM who complains of right wrist pain and swelling since Thursday 03/23/21. He says he hit his wrist on something both Thursday and Friday and that the pain and swelling has gotten worse since then. He first went to [facility] where they sent him to the ED for possible septic joint. At the time, he had no fever and full ROM of his right wrist and hand. While in the ED, he did develop a fever of 102 and tachycardia and labs showed elevated CRP and ESR. A joint aspiration showed no evidence of crystals, no evidence of organisms on Gram stain, and 32,000 WBCs. He says that he has had 2 prior episodes of right wrist pain and swelling in the past similar to this episode. One time it was treated as gout with Colchicine and the other time he was admitted and had a wrist washout performed.   Today he reports that he feels the pain and swelling is rapidly getting worse and is now having trouble with ROM and sensation in his fingers.    Imaging shows soft tissue swelling over the dorsal aspect of the wrist without evidence of fracture, malalignment or erosive bony changes. Small 3 mm radiopaque foreign body in the superficial soft tissues of the dorsal mid forearm overlying the ulna.   Ultrasound shows small fluid collection seen along the ulnar surface of the wrist measuring 10 x 5 x 2 mm. There appears to be fluid along the extensor tendons on the dorsum of the hand. Small fluid collection in the scaphoid area on the dorsum of the wrist measures 9 x 8 x 38mm.   Orthopedics was consulted for evaluation.   Last meal yesterday. History of MI, DVT, PE. No history of CVA. Previously ambulatory without the use of assistive devices. The patient is living at home.    Past Medical History:  Diagnosis Date  . AIDS  ([place]) 05/24/2015  . Allergic rhinitis 01/25/2016  . Atopic dermatitis 12/30/2017  . 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 CMV (cytomegalovirus infection) ([place]) 02/13/2012  . Cytomegalovirus ([place]) 02-13-2012  . Deep vein thrombosis ([place]) 05/09/2011   Overview:  Jan 2012   . Diabetes mellitus, controlled ([place]) 01/25/2016  . Dysplasia of anus   . Esophageal reflux   . 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 Hernia, inguinal 04/09/2013  . HIV (human immunodeficiency virus infection) ([place])   . HIV (human immunodeficiency virus infection) ([place])   . Horseshoe tear of retina without detachment 02-13-12  . 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  . Molluscum contagiosum 09-29-2012  . 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 infarction   . Other convulsions   . PE (pulmonary embolism) 04/09/2013   Overview:  Jan 2012   . Personal history of venous thrombosis and embolism   . Secondary iridocyclitis, infectious   . Septic arthritis of wrist ([place])    rt  . Shortness of breath dyspnea   . Unspecified hereditary and idiopathic peripheral neuropathy   . 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 Viral URI 03/10/2018   Past Surgical History:  Procedure Laterality Date  . COLONOSCOPY    . I & D EXTREMITY Right 07/28/2015   Procedure: IRRIGATION AND DEBRIDEMENT WRIST;  Surgeon: Dayna Barker, MD;  Location: [facility];  Service: Plastics;  Laterality: Right;   Social History  Socioeconomic History  . Marital status: Single    Spouse name: Not on file  . Number of children: Not on file  . Years of education: Not on file  . Highest education level: Not on file  Occupational History  . Not on file  Tobacco Use  . Smoking status: Never Smoker  . Smokeless tobacco: Never Used  Vaping Use  . Vaping Use: Never used  Substance and Sexual Activity  . Alcohol use: No    Alcohol/week: 0.0 standard drinks  . Drug use: No  . Sexual activity: Not Currently    Partners: Male    Birth control/protection: Condom  Other Topics Concern  . Not  on file  Social History Narrative  . Not on file   Social Determinants of Health   Financial Resource Strain: Not on file  Food Insecurity: Not on file  Transportation Needs: Not on file  Physical Activity: Not on file  Stress: Not on file  Social Connections: Not on file   No family history on file. Allergies  Allergen Reactions  . Penicillins Hives and Rash    Has patient had a PCN reaction causing immediate rash, facial/tongue/throat swelling, SOB or lightheadedness with hypotension:YES Has patient had a PCN reaction causing Has patient had a PCN reaction that required hospitalization NO Has patient had a PCN reaction occurring within the last 10 years: YES If all of the above answers are "NO", then may proceed with Cephalosporin use.  . Shellfish Allergy Shortness Of Breath  . Latex Rash  . Metformin Nausea Only    Both IR and XR formulations   Prior to Admission medications   Medication Sig Start Date End Date Taking? Authorizing Provider  albuterol (PROVENTIL HFA;VENTOLIN HFA) 108 (90 BASE) MCG/ACT inhaler Inhale 2 puffs into the lungs every 6 (six) hours as needed for wheezing or shortness of breath.   Yes [provider]  atorvastatin (LIPITOR) 40 MG tablet Take 40 mg by mouth daily.   Yes [provider]  bictegravir-emtricitabine-tenofovir AF (BIKTARVY) 50-200-25 MG TABS tablet Take 1 tablet by mouth daily. 07/21/20  Yes [place] Callas, NP  colchicine 0.6 MG tablet Take 1 tablet (0.6 mg total) by mouth daily. 05/22/18  Yes Rancour, Annie Main, MD  DUPIXENT 300 MG/2ML prefilled syringe  03/28/20  Yes [provider]  glipiZIDE (GLUCOTROL XL) 2.5 MG 24 hr tablet Take 2.5 mg by mouth. 12/22/15  Yes [provider]  hydrOXYzine (ATARAX/VISTARIL) 25 MG tablet Take 1 tablet (25 mg total) by mouth at bedtime as needed. 07/21/20  Yes [place] Callas, NP  insulin lispro (HUMALOG) 100 UNIT/ML KwikPen Inject into the skin. 06/09/20  Yes [provider]  LANTUS SOLOSTAR 100 UNIT/ML Solostar Pen Inject into the skin. 06/09/20  Yes [provider]  lisinopril (PRINIVIL,ZESTRIL) 2.5 MG tablet Take 2.5 mg by mouth daily.   Yes [provider]  Semaglutide (RYBELSUS) 14 MG TABS Take 14 mg by mouth daily. 06/30/20  Yes [provider]  sertraline (ZOLOFT) 50 MG tablet Take 50 mg by mouth daily. 03/15/20 04/22/21 Yes [provider]  traMADol (ULTRAM) 50 MG tablet Take 1 tablet (50 mg total) by mouth every 6 (six) hours as needed. 09/28/20  Yes Raylene Everts, MD  triamcinolone ointment (KENALOG) 0.1 % Apply topically 2 (two) times daily. 07/04/20  Yes [provider]  ACCU-CHEK AVIVA PLUS test strip  07/16/17   [provider]  Alcohol Swabs (ALCOHOL PREP) 70 % PADS  07/16/17   [provider]  ASSURE COMFORT LANCETS 30G [place]  07/16/17   [provider]   DG Wrist Complete Right  Result Date: 03/25/2021 CLINICAL DATA:  Work injury EXAM: RIGHT WRIST - COMPLETE 3+ VIEW COMPARISON:  None. FINDINGS: No distal radius or ulnar fracture. Radiocarpal joint is intact. No carpal fracture. No soft tissue abnormality. IMPRESSION: No fracture or dislocation. Electronically Signed   By: Suzy Bouchard M.D.   On: 03/25/2021 12:26   DG Wrist Complete Right  Result Date: 03/25/2021 CLINICAL DATA:  Right wrist and forearm pain and swelling, no known injury. History of gout. EXAM: RIGHT WRIST - COMPLETE 3+ VIEW COMPARISON:  None. FINDINGS: No acute fracture or malalignment. No evidence of erosive changes or other evidence of inflammatory arthropathy. A small 2-3 mm radiopaque foreign body is present in the superficial soft tissues of the dorsal mid forearm overlying the ulna. Marked soft tissue swelling is present along the dorsal aspect of the wrist joint. IMPRESSION: 1. Soft tissue swelling over the dorsal aspect of the wrist without evidence of fracture, malalignment or erosive bony  changes. 2. Small 3 mm radiopaque foreign body in the superficial soft tissues of the dorsal mid forearm overlying the ulna. Electronically Signed   By: Jacqulynn Cadet M.D.   On: 03/25/2021 10:58   DG Chest Portable 1 View  Result Date: 03/25/2021 CLINICAL DATA:  Fever EXAM: PORTABLE CHEST 1 VIEW COMPARISON:  06/17/2016 FINDINGS: Linear scarring at the left base. No confluent opacities. Heart is normal size. No effusions or acute bony abnormality. IMPRESSION: No active disease. Electronically Signed   By: Rolm Baptise M.D.   On: 03/25/2021 22:21   [place] RT UPPER EXTREM LTD SOFT TISSUE NON VASCULAR  Result Date: 03/26/2021 CLINICAL DATA:  Concern for septic joint. EXAM: ULTRASOUND RIGHT UPPER EXTREMITY LIMITED TECHNIQUE: Ultrasound examination of the upper extremity soft tissues was performed in the area of clinical concern. COMPARISON:  Plain films 03/25/2021 FINDINGS: Small fluid collection seen along the ulnar surface of the wrist measuring 10 x 5 x 2 mm. There appears to be fluid along the extensor tendons on the dorsum of the hand. Small fluid collection in the scaphoid area on the dorsum of the wrist measures 9 x 8 x 3 mm. IMPRESSION: Small fluid collections along the dorsum of the wrist and hand as above, the largest along the extensor tendons which could reflect tenosynovitis. MRI may be helpful for further evaluation. Electronically Signed   By: Rolm Baptise M.D.   On: 03/26/2021 01:19    Positive ROS: All other systems have been reviewed and were otherwise negative with the exception of those mentioned in the HPI and as above.  Objective: Labs cbc Recent Labs    03/25/21 1620 03/26/21 0328  WBC 8.6 9.1  HGB 13.6 12.3*  HCT 42.8 37.6*  PLT 254 233    Labs inflam Recent Labs    03/25/21 1657  CRP 3.9*    Labs coag Recent Labs    03/25/21 1657  INR 1.4*    Recent Labs    03/25/21 1620 03/26/21 0328  NA 136 136  K 3.4* 3.7  CL 105 106  CO2 23 24  GLUCOSE 127* 118*   BUN 9 9  CREATININE 1.30* 1.14  CALCIUM 9.0 8.6*    Physical Exam: Vitals:   03/26/21 0210 03/26/21 0649  BP: (!) 169/91 120/75  Pulse: (!) 108 (!) 110  Resp: 18 16  Temp: 99.2 F (37.3 C)  100 F (37.8 C)  SpO2: 99% 95%   General: Alert, no acute distress. Laying in bed.  Mental status: Alert and Oriented x3 Neurologic: Speech Clear and organized, no gross focal findings or movement disorder appreciated. Respiratory: No cyanosis, no use of accessory musculature Cardiovascular: No pedal edema GI: Abdomen is soft and non-tender, non-distended. Skin: Warm and dry.  No lesions in the area of chief complaint. Extremities: Warm and well perfused  Psychiatric: Patient is competent for consent with normal mood and affect  MUSCULOSKELETAL:  Right wrist is edematous circumferentially. TTP medial and ulnar side of wrist near ulnar styloid. Warm to the touch. Strong radial pulse. Forearm compartments compressible and non-tender. Very limited ROM at wrist and hand. Unable to make a full fist or give me a thumbs up. Endorses decreased sensation in all fingers. Capillary refill brisk.  Other extremities are atraumatic with painless ROM and NVI.  Assessment / Plan: Active Problems:   Septic arthritis (HCC)  Differential: septic joint, gout, tenosynovitis  I have ordered a CT scan of the right wrist to better evaluate what is going on.  History and exam lean towards tentatively taking patient to the OR today for I&D of right wrist.     Weightbearing: NWB RUE Orthopedic device(s): None Showering: as normal for now VTE prophylaxis: Transitioned from Warfarin to Xarelto since January. Current on hold for surgery. Plan to restart after surgery.   Pain control: Continue current regimen Follow - up plan: TBD Contact information:  Edmonia Lynch MD, [facility] PA-C  Britt Bottom PA-C Office [phone] 03/26/2021 8:59 AM

## 2021-03-26 NOTE — Progress Notes (Signed)
Family Medicine Teaching Service Daily Progress Note Intern Pager: [phone]  Patient name: Craig [name] Medical record number: [id number] Date of birth: 04-09-1961 Age: 60 y.o. Gender: male  Primary Care Provider: Dwana Melena, PA Consultants: hand surgery Code Status: Full  Pt Overview and Major Events to Date:  4/2 - admitted for possible septic joint  Assessment and Plan: Craig [name] is a 60 y.o. male presenting with right wrist pain concerning for septic arthritis . PMH is significant for HIV (well-controlled), diabetes, hypertension, history of DVT, HFpEF.  Sepsis, likely secondary to septic joint Right wrist aspiration showed no evidence of crystals, no organisms noted on Gram stain, 32,000 WBCs.  Ultrasound notes small fluid collection along the ulnar surface and fluid surrounding the extensor tendons.  MRI is recommended if further evaluation is necessary.  We will not move forward with an MRI at this time as I have a low suspicion for osteomyelitis based on a CRP of 3 and ESR of 50.  No acute changes overnight.  Spoke with Dr. Eulah Pont, with hand surgery who plans to see today. -Follow-up hand surgery recommendations -Continue ceftriaxone -Continue vancomycin -Follow-up OT recommendations  AKI, improved Creatinine 1.3 on admission.  Now improved to 1.1. -Hold home lisinopril -Avoid nephrotoxic medication  HIV, well controlled Last CD4 count 517 (06/2020), last viral load undetectable (06/2020).  Home medication currently includes Biktarvy. -Continue Biktarvy  Diabetes Last A1c 5.7 (02/03/2021).  Blood sugar well controlled at this time. -Hold home glipizide and semaglutide -SSI sensitive -Monitor CBGs  History of DVT Rivaroxaban held for now. -Restart rivaroxaban if no surgery  Mood disorder -Continue home sertraline  Sleep disorder -Home Atarax nightly as needed  Heart failure with preserved ejection fraction Echo from 10/2018 demonstrates an EF of  50-55% with grade 1 diastolic dysfunction.  No evidence of fluid overload on exam. -Monitor fluid status  FEN/GI: N.p.o. in case of surgery PPx: SCDs, restart rivaroxaban if no surgery   Status is: Observation  The patient remains OBS appropriate and will d/c before 2 midnights.  Dispo: The patient is from: Home              Anticipated d/c is to: Home              Patient currently is not medically stable to d/c.   Difficult to place patient No  Subjective:  No acute events overnight.  He has been tolerating his IV antibiotics well and is comfortable with his current pain regimen.  He has not noticed any changes in his ability to flex or extend his fingers.  Objective: Temp:  [99.1 F (37.3 C)-102 F (38.9 C)] 100 F (37.8 C) (04/03 0649) Pulse Rate:  [98-118] 110 (04/03 0649) Resp:  [16-18] 16 (04/03 0649) BP: (120-169)/(73-99) 120/75 (04/03 0649) SpO2:  [95 %-100 %] 95 % (04/03 0649) Physical Exam: General: Alert and cooperative and appears to be in no acute distress. Resting on bed comfortably Cardio: Normal S1 and S2, no S3 or S4. Rhythm is regular, mildly tachycardic. No murmurs or rubs.   Pulm: Clear to auscultation bilaterally, no crackles, wheezing, or diminished breath sounds. Normal respiratory effort Abdomen: Bowel sounds normal. Abdomen soft and non-tender.  Extremities: No peripheral edema. Warm/ well perfused.  Strong radial pulses. Neuro: Cranial nerves grossly intact   Laboratory: Recent Labs  Lab 03/25/21 1620 03/26/21 0328  WBC 8.6 9.1  HGB 13.6 12.3*  HCT 42.8 37.6*  PLT 254 233   Recent Labs  Lab 03/25/21  1620 03/26/21 0328  NA 136 136  K 3.4* 3.7  CL 105 106  CO2 23 24  BUN 9 9  CREATININE 1.30* 1.14  CALCIUM 9.0 8.6*  PROT  --  7.9  BILITOT  --  0.8  ALKPHOS  --  71  ALT  --  18  AST  --  17  GLUCOSE 127* 118*   Imaging/Diagnostic Tests:  DG Wrist Complete Right  Result Date: 03/25/2021 CLINICAL DATA:  Work injury EXAM: RIGHT  WRIST - COMPLETE 3+ VIEW COMPARISON:  None. FINDINGS: No distal radius or ulnar fracture. Radiocarpal joint is intact. No carpal fracture. No soft tissue abnormality. IMPRESSION: No fracture or dislocation. Electronically Signed   By: Suzy Bouchard M.D.   On: 03/25/2021 12:26   DG Wrist Complete Right  Result Date: 03/25/2021 CLINICAL DATA:  Right wrist and forearm pain and swelling, no known injury. History of gout. EXAM: RIGHT WRIST - COMPLETE 3+ VIEW COMPARISON:  None. FINDINGS: No acute fracture or malalignment. No evidence of erosive changes or other evidence of inflammatory arthropathy. A small 2-3 mm radiopaque foreign body is present in the superficial soft tissues of the dorsal mid forearm overlying the ulna. Marked soft tissue swelling is present along the dorsal aspect of the wrist joint. IMPRESSION: 1. Soft tissue swelling over the dorsal aspect of the wrist without evidence of fracture, malalignment or erosive bony changes. 2. Small 3 mm radiopaque foreign body in the superficial soft tissues of the dorsal mid forearm overlying the ulna. Electronically Signed   By: Jacqulynn Cadet M.D.   On: 03/25/2021 10:58   DG Chest Portable 1 View  Result Date: 03/25/2021 CLINICAL DATA:  Fever EXAM: PORTABLE CHEST 1 VIEW COMPARISON:  06/17/2016 FINDINGS: Linear scarring at the left base. No confluent opacities. Heart is normal size. No effusions or acute bony abnormality. IMPRESSION: No active disease. Electronically Signed   By: Rolm Baptise M.D.   On: 03/25/2021 22:21   [place] RT UPPER EXTREM LTD SOFT TISSUE NON VASCULAR  Result Date: 03/26/2021 CLINICAL DATA:  Concern for septic joint. EXAM: ULTRASOUND RIGHT UPPER EXTREMITY LIMITED TECHNIQUE: Ultrasound examination of the upper extremity soft tissues was performed in the area of clinical concern. COMPARISON:  Plain films 03/25/2021 FINDINGS: Small fluid collection seen along the ulnar surface of the wrist measuring 10 x 5 x 2 mm. There appears to be  fluid along the extensor tendons on the dorsum of the hand. Small fluid collection in the scaphoid area on the dorsum of the wrist measures 9 x 8 x 3 mm. IMPRESSION: Small fluid collections along the dorsum of the wrist and hand as above, the largest along the extensor tendons which could reflect tenosynovitis. MRI may be helpful for further evaluation. Electronically Signed   By: Rolm Baptise M.D.   On: 03/26/2021 01:19     Matilde Haymaker, MD 03/26/2021, 7:51 AM PGY-3, [place] Intern pager: [phone], text pages welcome

## 2021-03-26 NOTE — Progress Notes (Signed)
New Admission Note: ? Arrival Method: Stretcher  Mental Orientation: AXOX4 Telemetry: None  Assessment: Completed Skin: Refer to flowsheet IV: Right Antecubital  Pain: 9/10 medicated  Tubes: None  Safety Measures: Safety Fall Prevention Plan discussed with patient. Admission: Completed [facility] [place] Orientation: Patient has been orientated to the room, unit and the staff. Family: None  Orders have been reviewed and are being implemented. Will continue to monitor the patient. Call light has been placed within reach and bed alarm has been activated.  ? Donia Guiles, RN  Phone Number: [phone]

## 2021-03-26 NOTE — H&P (View-Only) (Signed)
ORTHOPAEDIC CONSULTATION  REQUESTING PHYSICIAN: Lenoria Chime, MD  Chief Complaint: right wrist pain and swelling  HPI: Craig [name] is a 60 y.o. male with a history of HIV+, gout, HTN, DVT, HFpEF, and DM who complains of right wrist pain and swelling since Thursday 03/23/21. He says he hit his wrist on something both Thursday and Friday and that the pain and swelling has gotten worse since then. He first went to [facility] where they sent him to the ED for possible septic joint. At the time, he had no fever and full ROM of his right wrist and hand. While in the ED, he did develop a fever of 102 and tachycardia and labs showed elevated CRP and ESR. A joint aspiration showed no evidence of crystals, no evidence of organisms on Gram stain, and 32,000 WBCs. He says that he has had 2 prior episodes of right wrist pain and swelling in the past similar to this episode. One time it was treated as gout with Colchicine and the other time he was admitted and had a wrist washout performed.   Today he reports that he feels the pain and swelling is rapidly getting worse and is now having trouble with ROM and sensation in his fingers.    Imaging shows soft tissue swelling over the dorsal aspect of the wrist without evidence of fracture, malalignment or erosive bony changes. Small 3 mm radiopaque foreign body in the superficial soft tissues of the dorsal mid forearm overlying the ulna.   Ultrasound shows small fluid collection seen along the ulnar surface of the wrist measuring 10 x 5 x 2 mm. There appears to be fluid along the extensor tendons on the dorsum of the hand. Small fluid collection in the scaphoid area on the dorsum of the wrist measures 9 x 8 x 42mm.   Orthopedics was consulted for evaluation.   Last meal yesterday. History of MI, DVT, PE. No history of CVA. Previously ambulatory without the use of assistive devices. The patient is living at home.    Past Medical History:  Diagnosis Date  . AIDS  ([place]) 05/24/2015  . Allergic rhinitis 01/25/2016  . Atopic dermatitis 12/30/2017  . Attack, epileptiform ([place]) 04/09/2013  . CMV (cytomegalovirus infection) ([place]) 02/13/2012  . Cytomegalovirus ([place]) 02-13-2012  . Deep vein thrombosis ([place]) 05/09/2011   Overview:  Jan 2012   . Diabetes mellitus, controlled ([place]) 01/25/2016  . Dysplasia of anus   . Esophageal reflux   . 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 Hernia, inguinal 04/09/2013  . HIV (human immunodeficiency virus infection) ([place])   . HIV (human immunodeficiency virus infection) ([place])   . Horseshoe tear of retina without detachment 02-13-12  . 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  . Molluscum contagiosum 09-29-2012  . 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 infarction   . Other convulsions   . PE (pulmonary embolism) 04/09/2013   Overview:  Jan 2012   . Personal history of venous thrombosis and embolism   . Secondary iridocyclitis, infectious   . Septic arthritis of wrist ([place])    rt  . Shortness of breath dyspnea   . Unspecified hereditary and idiopathic peripheral neuropathy   . 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 Viral URI 03/10/2018   Past Surgical History:  Procedure Laterality Date  . COLONOSCOPY    . I & D EXTREMITY Right 07/28/2015   Procedure: IRRIGATION AND DEBRIDEMENT WRIST;  Surgeon: Dayna Barker, MD;  Location: [facility];  Service: Plastics;  Laterality: Right;   Social History  Socioeconomic History  . Marital status: Single    Spouse name: Not on file  . Number of children: Not on file  . Years of education: Not on file  . Highest education level: Not on file  Occupational History  . Not on file  Tobacco Use  . Smoking status: Never Smoker  . Smokeless tobacco: Never Used  Vaping Use  . Vaping Use: Never used  Substance and Sexual Activity  . Alcohol use: No    Alcohol/week: 0.0 standard drinks  . Drug use: No  . Sexual activity: Not Currently    Partners: Male    Birth control/protection: Condom  Other Topics Concern  . Not  on file  Social History Narrative  . Not on file   Social Determinants of Health   Financial Resource Strain: Not on file  Food Insecurity: Not on file  Transportation Needs: Not on file  Physical Activity: Not on file  Stress: Not on file  Social Connections: Not on file   No family history on file. Allergies  Allergen Reactions  . Penicillins Hives and Rash    Has patient had a PCN reaction causing immediate rash, facial/tongue/throat swelling, SOB or lightheadedness with hypotension:YES Has patient had a PCN reaction causing Has patient had a PCN reaction that required hospitalization NO Has patient had a PCN reaction occurring within the last 10 years: YES If all of the above answers are "NO", then may proceed with Cephalosporin use.  . Shellfish Allergy Shortness Of Breath  . Latex Rash  . Metformin Nausea Only    Both IR and XR formulations   Prior to Admission medications   Medication Sig Start Date End Date Taking? Authorizing Provider  albuterol (PROVENTIL HFA;VENTOLIN HFA) 108 (90 BASE) MCG/ACT inhaler Inhale 2 puffs into the lungs every 6 (six) hours as needed for wheezing or shortness of breath.   Yes [provider]  atorvastatin (LIPITOR) 40 MG tablet Take 40 mg by mouth daily.   Yes [provider]  bictegravir-emtricitabine-tenofovir AF (BIKTARVY) 50-200-25 MG TABS tablet Take 1 tablet by mouth daily. 07/21/20  Yes Grandin Callas, NP  colchicine 0.6 MG tablet Take 1 tablet (0.6 mg total) by mouth daily. 05/22/18  Yes Rancour, Annie Main, MD  DUPIXENT 300 MG/2ML prefilled syringe  03/28/20  Yes [provider]  glipiZIDE (GLUCOTROL XL) 2.5 MG 24 hr tablet Take 2.5 mg by mouth. 12/22/15  Yes [provider]  hydrOXYzine (ATARAX/VISTARIL) 25 MG tablet Take 1 tablet (25 mg total) by mouth at bedtime as needed. 07/21/20  Yes [place] Callas, NP  insulin lispro (HUMALOG) 100 UNIT/ML KwikPen Inject into the skin. 06/09/20  Yes [provider]  LANTUS SOLOSTAR 100 UNIT/ML Solostar Pen Inject into the skin. 06/09/20  Yes [provider]  lisinopril (PRINIVIL,ZESTRIL) 2.5 MG tablet Take 2.5 mg by mouth daily.   Yes [provider]  Semaglutide (RYBELSUS) 14 MG TABS Take 14 mg by mouth daily. 06/30/20  Yes [provider]  sertraline (ZOLOFT) 50 MG tablet Take 50 mg by mouth daily. 03/15/20 04/22/21 Yes [provider]  traMADol (ULTRAM) 50 MG tablet Take 1 tablet (50 mg total) by mouth every 6 (six) hours as needed. 09/28/20  Yes Raylene Everts, MD  triamcinolone ointment (KENALOG) 0.1 % Apply topically 2 (two) times daily. 07/04/20  Yes [provider]  ACCU-CHEK AVIVA PLUS test strip  07/16/17   [provider]  Alcohol Swabs (ALCOHOL PREP) 70 % PADS  07/16/17   [provider]  ASSURE COMFORT LANCETS 30G [place]  07/16/17   [provider]   DG Wrist Complete Right  Result Date: 03/25/2021 CLINICAL DATA:  Work injury EXAM: RIGHT WRIST - COMPLETE 3+ VIEW COMPARISON:  None. FINDINGS: No distal radius or ulnar fracture. Radiocarpal joint is intact. No carpal fracture. No soft tissue abnormality. IMPRESSION: No fracture or dislocation. Electronically Signed   By: Suzy Bouchard M.D.   On: 03/25/2021 12:26   DG Wrist Complete Right  Result Date: 03/25/2021 CLINICAL DATA:  Right wrist and forearm pain and swelling, no known injury. History of gout. EXAM: RIGHT WRIST - COMPLETE 3+ VIEW COMPARISON:  None. FINDINGS: No acute fracture or malalignment. No evidence of erosive changes or other evidence of inflammatory arthropathy. A small 2-3 mm radiopaque foreign body is present in the superficial soft tissues of the dorsal mid forearm overlying the ulna. Marked soft tissue swelling is present along the dorsal aspect of the wrist joint. IMPRESSION: 1. Soft tissue swelling over the dorsal aspect of the wrist without evidence of fracture, malalignment or erosive bony  changes. 2. Small 3 mm radiopaque foreign body in the superficial soft tissues of the dorsal mid forearm overlying the ulna. Electronically Signed   By: Jacqulynn Cadet M.D.   On: 03/25/2021 10:58   DG Chest Portable 1 View  Result Date: 03/25/2021 CLINICAL DATA:  Fever EXAM: PORTABLE CHEST 1 VIEW COMPARISON:  06/17/2016 FINDINGS: Linear scarring at the left base. No confluent opacities. Heart is normal size. No effusions or acute bony abnormality. IMPRESSION: No active disease. Electronically Signed   By: Rolm Baptise M.D.   On: 03/25/2021 22:21   [place] RT UPPER EXTREM LTD SOFT TISSUE NON VASCULAR  Result Date: 03/26/2021 CLINICAL DATA:  Concern for septic joint. EXAM: ULTRASOUND RIGHT UPPER EXTREMITY LIMITED TECHNIQUE: Ultrasound examination of the upper extremity soft tissues was performed in the area of clinical concern. COMPARISON:  Plain films 03/25/2021 FINDINGS: Small fluid collection seen along the ulnar surface of the wrist measuring 10 x 5 x 2 mm. There appears to be fluid along the extensor tendons on the dorsum of the hand. Small fluid collection in the scaphoid area on the dorsum of the wrist measures 9 x 8 x 3 mm. IMPRESSION: Small fluid collections along the dorsum of the wrist and hand as above, the largest along the extensor tendons which could reflect tenosynovitis. MRI may be helpful for further evaluation. Electronically Signed   By: Rolm Baptise M.D.   On: 03/26/2021 01:19    Positive ROS: All other systems have been reviewed and were otherwise negative with the exception of those mentioned in the HPI and as above.  Objective: Labs cbc Recent Labs    03/25/21 1620 03/26/21 0328  WBC 8.6 9.1  HGB 13.6 12.3*  HCT 42.8 37.6*  PLT 254 233    Labs inflam Recent Labs    03/25/21 1657  CRP 3.9*    Labs coag Recent Labs    03/25/21 1657  INR 1.4*    Recent Labs    03/25/21 1620 03/26/21 0328  NA 136 136  K 3.4* 3.7  CL 105 106  CO2 23 24  GLUCOSE 127* 118*   BUN 9 9  CREATININE 1.30* 1.14  CALCIUM 9.0 8.6*    Physical Exam: Vitals:   03/26/21 0210 03/26/21 0649  BP: (!) 169/91 120/75  Pulse: (!) 108 (!) 110  Resp: 18 16  Temp: 99.2 F (37.3 C)  100 F (37.8 C)  SpO2: 99% 95%   General: Alert, no acute distress. Laying in bed.  Mental status: Alert and Oriented x3 Neurologic: Speech Clear and organized, no gross focal findings or movement disorder appreciated. Respiratory: No cyanosis, no use of accessory musculature Cardiovascular: No pedal edema GI: Abdomen is soft and non-tender, non-distended. Skin: Warm and dry.  No lesions in the area of chief complaint. Extremities: Warm and well perfused  Psychiatric: Patient is competent for consent with normal mood and affect  MUSCULOSKELETAL:  Right wrist is edematous circumferentially. TTP medial and ulnar side of wrist near ulnar styloid. Warm to the touch. Strong radial pulse. Forearm compartments compressible and non-tender. Very limited ROM at wrist and hand. Unable to make a full fist or give me a thumbs up. Endorses decreased sensation in all fingers. Capillary refill brisk.  Other extremities are atraumatic with painless ROM and NVI.  Assessment / Plan: Active Problems:   Septic arthritis (HCC)  Differential: septic joint, gout, tenosynovitis  I have ordered a CT scan of the right wrist to better evaluate what is going on.  History and exam lean towards tentatively taking patient to the OR today for I&D of right wrist.     Weightbearing: NWB RUE Orthopedic device(s): None Showering: as normal for now VTE prophylaxis: Transitioned from Warfarin to Xarelto since January. Current on hold for surgery. Plan to restart after surgery.   Pain control: Continue current regimen Follow - up plan: TBD Contact information:  Edmonia Lynch MD, [facility] PA-C  Britt Bottom PA-C Office [phone] 03/26/2021 8:59 AM

## 2021-03-26 NOTE — Transfer of Care (Signed)
Immediate Anesthesia Transfer of Care Note  Patient: Craig [name]  Procedure(s) Performed: IRRIGATION AND DEBRIDEMENT WRIST (Right Wrist)  Patient Location: PACU  Anesthesia Type:General  Level of Consciousness: drowsy  Airway & Oxygen Therapy: Patient Spontanous Breathing  Post-op Assessment: Report given to RN and Post -op Vital signs reviewed and stable  Post vital signs: Reviewed and stable  Last Vitals:  Vitals Value Taken Time  BP 108/73 03/26/21 1519  Temp    Pulse 102 03/26/21 1523  Resp 20 03/26/21 1523  SpO2 92 % 03/26/21 1523  Vitals shown include unvalidated device data.  Last Pain:  Vitals:   03/26/21 1519  TempSrc:   PainSc: Asleep         Complications: No complications documented.

## 2021-03-26 NOTE — Plan of Care (Signed)
  Problem: Education: Goal: Knowledge of General Education information will improve Description: Including pain rating scale, medication(s)/side effects and non-pharmacologic comfort measures Outcome: Progressing   Problem: Pain Managment: Goal: General experience of comfort will improve Outcome: Progressing   

## 2021-03-26 NOTE — Interval H&P Note (Signed)
History and Physical Interval Note:  03/26/2021 2:04 PM  Craig [name]  has presented today for surgery, with the diagnosis of Septic Wrist Right.  The various methods of treatment have been discussed with the patient and family. After consideration of risks, benefits and other options for treatment, the patient has consented to  Procedure(s): IRRIGATION AND DEBRIDEMENT WRIST (Right) as a surgical intervention.  The patient's history has been reviewed, patient examined, no change in status, stable for surgery.  I have reviewed the patient's chart and labs.  Questions were answered to the patient's satisfaction.     Sheral Apley

## 2021-03-26 NOTE — Progress Notes (Signed)
Orthopedic Tech Progress Note Patient Details:  Craig [name] [birth year] [id number]  Ortho Devices Type of Ortho Device: Velcro wrist splint Ortho Device/Splint Location: Right Upper Extremity Ortho Device/Splint Interventions: Ordered,Application,Adjustment   Post Interventions Patient Tolerated: Well Instructions Provided: Adjustment of device,Care of device,Poper ambulation with device   [name] P Harle Stanford 03/26/2021, 6:09 PM

## 2021-03-26 NOTE — Progress Notes (Signed)
Hypoglycemic Event  CBG: 63  Treatment: D50 25 mL (12.5 gm)  Symptoms: None  Follow-up CBG: Time:1336 CBG Result:107  Possible Reasons for Event: Other: NPO  Comments/MD notified:Dr. Hodierne aware    Poet Hineman R

## 2021-03-26 NOTE — Plan of Care (Signed)
  Problem: Education: Goal: Knowledge of General Education information will improve Description: Including pain rating scale, medication(s)/side effects and non-pharmacologic comfort measures Outcome: Progressing   Problem: Coping: Goal: Level of anxiety will decrease Outcome: Progressing   Problem: Pain Managment: Goal: General experience of comfort will improve Outcome: Progressing   

## 2021-03-26 NOTE — Anesthesia Procedure Notes (Signed)
Procedure Name: LMA Insertion Date/Time: 03/26/2021 2:13 PM Performed by: Dairl Ponder, CRNA Pre-anesthesia Checklist: Patient identified, Emergency Drugs available, Suction available, Patient being monitored and Timeout performed Patient Re-evaluated:Patient Re-evaluated prior to induction Oxygen Delivery Method: Circle system utilized Preoxygenation: Pre-oxygenation with 100% oxygen Induction Type: IV induction LMA: LMA with gastric port inserted LMA Size: 5.0 Number of attempts: 1 Placement Confirmation: positive ETCO2 and breath sounds checked- equal and bilateral Tube secured with: Tape Dental Injury: Teeth and Oropharynx as per pre-operative assessment

## 2021-03-26 NOTE — Anesthesia Preprocedure Evaluation (Signed)
Anesthesia Evaluation  Patient identified by MRN, date of birth, ID band Patient awake    Reviewed: Allergy & Precautions, H&P , NPO status , Patient's Chart, lab work & pertinent test results  Airway Mallampati: II   Neck ROM: full    Dental   Pulmonary shortness of breath, asthma ,    breath sounds clear to auscultation       Cardiovascular hypertension, + CAD and + Past MI   Rhythm:regular Rate:Normal     Neuro/Psych Seizures -,  PSYCHIATRIC DISORDERS Depression  Neuromuscular disease    GI/Hepatic GERD  ,  Endo/Other  diabetes, Type 2  Renal/GU      Musculoskeletal  (+) Arthritis ,   Abdominal   Peds  Hematology  (+) HIV,   Anesthesia Other Findings   Reproductive/Obstetrics                             Anesthesia Physical Anesthesia Plan  ASA: III  Anesthesia Plan: General   Post-op Pain Management:    Induction: Intravenous  PONV Risk Score and Plan: 2 and Ondansetron, Dexamethasone, Midazolam and Treatment may vary due to age or medical condition  Airway Management Planned: LMA  Additional Equipment:   Intra-op Plan:   Post-operative Plan: Extubation in OR  Informed Consent: I have reviewed the patients History and Physical, chart, labs and discussed the procedure including the risks, benefits and alternatives for the proposed anesthesia with the patient or authorized representative who has indicated his/her understanding and acceptance.     Dental advisory given  Plan Discussed with: CRNA, Anesthesiologist and Surgeon  Anesthesia Plan Comments:         Anesthesia Quick Evaluation

## 2021-03-27 ENCOUNTER — Encounter (HOSPITAL_COMMUNITY): Payer: Self-pay | Admitting: Orthopedic Surgery

## 2021-03-27 DIAGNOSIS — B20 Human immunodeficiency virus [HIV] disease: Secondary | ICD-10-CM

## 2021-03-27 DIAGNOSIS — M009 Pyogenic arthritis, unspecified: Secondary | ICD-10-CM | POA: Diagnosis not present

## 2021-03-27 DIAGNOSIS — M659 Synovitis and tenosynovitis, unspecified: Secondary | ICD-10-CM | POA: Diagnosis not present

## 2021-03-27 LAB — COMPREHENSIVE METABOLIC PANEL
ALT: 17 U/L (ref 0–44)
AST: 17 U/L (ref 15–41)
Albumin: 2.7 g/dL — ABNORMAL LOW (ref 3.5–5.0)
Alkaline Phosphatase: 69 U/L (ref 38–126)
Anion gap: 7 (ref 5–15)
BUN: 17 mg/dL (ref 6–20)
CO2: 23 mmol/L (ref 22–32)
Calcium: 8.8 mg/dL — ABNORMAL LOW (ref 8.9–10.3)
Chloride: 105 mmol/L (ref 98–111)
Creatinine, Ser: 1.19 mg/dL (ref 0.61–1.24)
GFR, Estimated: >60 mL/min (ref >60)
Glucose, Bld: 189 mg/dL — ABNORMAL HIGH (ref 70–99)
Potassium: 3.9 mmol/L (ref 3.5–5.1)
Sodium: 135 mmol/L (ref 135–145)
Total Bilirubin: 0.5 mg/dL (ref 0.3–1.2)
Total Protein: 8.2 g/dL — ABNORMAL HIGH (ref 6.5–8.1)

## 2021-03-27 LAB — GLUCOSE, CAPILLARY
Glucose-Capillary: 110 mg/dL — ABNORMAL HIGH (ref 70–99)
Glucose-Capillary: 128 mg/dL — ABNORMAL HIGH (ref 70–99)
Glucose-Capillary: 119 mg/dL — ABNORMAL HIGH (ref 70–99)
Glucose-Capillary: 102 mg/dL — ABNORMAL HIGH (ref 70–99)

## 2021-03-27 LAB — CBC
HCT: 39.2 % (ref 39.0–52.0)
Hemoglobin: 12.8 g/dL — ABNORMAL LOW (ref 13.0–17.0)
MCH: 29.2 pg (ref 26.0–34.0)
MCHC: 32.7 g/dL (ref 30.0–36.0)
MCV: 89.3 fL (ref 80.0–100.0)
Platelets: 257 10*3/uL (ref 150–400)
RBC: 4.39 MIL/uL (ref 4.22–5.81)
RDW: 14.5 % (ref 11.5–15.5)
WBC: 11.5 10*3/uL — ABNORMAL HIGH (ref 4.0–10.5)
nRBC: 0 % (ref 0.0–0.2)

## 2021-03-27 LAB — C-REACTIVE PROTEIN: CRP: 13.1 mg/dL — ABNORMAL HIGH (ref <1.0)

## 2021-03-27 MED ORDER — SODIUM CHLORIDE 0.9 % IV SOLN
8.0000 mg/kg | Freq: Every day | INTRAVENOUS | Status: DC
  Filled 2021-03-27: qty 14

## 2021-03-27 MED ORDER — SODIUM CHLORIDE 0.9 % IV SOLN
8.0000 mg/kg | Freq: Every day | INTRAVENOUS | Status: DC
  Administered 2021-03-27 – 2021-03-28 (×2): 700 mg via INTRAVENOUS
  Filled 2021-03-27 (×2): qty 14

## 2021-03-27 NOTE — Progress Notes (Signed)
Family Medicine Teaching Service Daily Progress Note Intern Pager: [phone]  Patient name: [name] Batson Medical record number: [id number] Date of birth: 1961-10-10 Age: 60 y.o. Gender: male  Primary Care Provider: Dwana Melena, PA Consultants: hand surgery, ID   Code Status: Full Code  Pt Overview and Major Events to Date:  4/2 admitted 4/3 irrigation and debridement of right wrist  Assessment and Plan: Phyllis Whitefield is a 60 y.o. male presenting with right wrist pain concerning for septic arthritis now s/p irrigation and debridgement 4/3. PMH is significant for HIV (well-controlled), diabetes, hypertension, history of DVT, HFpEF.  Septic arthritis Right wrist aspiration with no organisms on Gram stain and with 32,000 WBCs.  Low suspicion for osteomyelitis given unremarkable inflammatory markers.  He underwent successful irrigation and debridement of his right wrist yesterday.  Notably had purulent fluid noted is dorsal ext compartments and wrist.  Today he is significantly improved and has full range of motion in his right hand. Infectious disease consulted to assist with antibiotic regimen, recommending 4 weeks of IV antibiotic treatment and to switch from vancomycin to daptomycin for better safety profile.  Will anticipate PICC line placement and completion of antibiotics at home. - ceftriaxone (4/2-) - daptomycin (4/4-) - s/p vancomycin (4/2-4/4)  HIV Chronic, stable on Biktarvy.  Last CD4 count 517 and viral load undetectable in July, 2021. - continue Biktarvy  AKI Resolved, Cr 1.14.  T2DM Most recent A1c 5.7 in February, 2022.  Well-controlled on sensitive sliding scale.  Takes glipizide and semaglutide at home. - holding home meds - sSSI - monitor CBG - can likely d/c glipizide, will defer to PCP  History of DVT Rivaroxaban restarted.  Mood disorder - sertraline  Sleep disorder - hydroxyzine prn  HFpEF Chronic, stable. EF 50-55% in 2019 with  G1DD.   FEN/GI: carb-modified PPx: rivaroxaban  Disposition: med-surg, home possibly tomorrow  Subjective:  NAOE.  States the procedure went well yesterday.  He states he has full range of motion of his right fingers now.  Objective: Temp:  [98.1 F (36.7 C)-98.7 F (37.1 C)] 98.4 F (36.9 C) (04/04 0547) Pulse Rate:  [76-105] 76 (04/04 0547) Resp:  [12-18] 18 (04/04 0547) BP: (105-135)/(65-80) 130/80 (04/04 0547) SpO2:  [95 %-100 %] 95 % (04/04 0547) Physical Exam: General: Well-appearing middle-aged male resting comfortably in bed, NAD Cardiovascular: RRR, no murmurs Respiratory: CTAB Abdomen: soft, non-tender Extremities: WWP, no edema, right wrist splint in place, right wrist with FROM without pain  Laboratory: Recent Labs  Lab 03/25/21 1620 03/26/21 0328  WBC 8.6 9.1  HGB 13.6 12.3*  HCT 42.8 37.6*  PLT 254 233   Recent Labs  Lab 03/25/21 1620 03/26/21 0328  NA 136 136  K 3.4* 3.7  CL 105 106  CO2 23 24  BUN 9 9  CREATININE 1.30* 1.14  CALCIUM 9.0 8.6*  PROT  --  7.9  BILITOT  --  0.8  ALKPHOS  --  71  ALT  --  18  AST  --  17  GLUCOSE 127* 118*     Imaging/Diagnostic Tests: No new imaging.  Littie Deeds, MD 03/27/2021, 6:55 AM PGY-1, [facility] Family Medicine FPTS Intern pager: [phone], text pages welcome

## 2021-03-27 NOTE — Evaluation (Signed)
Occupational Therapy Evaluation/Discharge Patient Details Name: Craig [name] MRN: [id number] DOB: 1961/03/16 Today's Date: 03/27/2021    History of Present Illness Craig [name] is a 60 y.o. male with a history of HIV+, gout, HTN, DVT, HFpEF, and DM who complains of right wrist pain and swelling since Thursday 03/23/21 and sent to ED from [facility] for evaluation of possible septic joint. Pt underwent I&D of R wrist on 4/3.   Clinical Impression   PTA, pt lives alone in second floor apartment and reports Independence in all daily tasks without use of AD. Pt works at a [employer] forces logistics/support/administrative officer. Pt presents now with reports of mild pain in R wrist, but improved sensation and digit ROM with minimal swelling. Educated pt on WB precautions, strategies for safe ADL/IADL completion to avoid compromised healing of R wrist. Instructed in ROM exercises, encouraged elevation. Pt verbalized understanding of all education. Per nursing staff, pt able to ambulate to bathroom without assist and pt able to demo ADL strategies with OT today. Pt may benefit from OP therapy follow-up in the future according to surgeon's plan for progression of WB/ROM/strengthening. No further OT services needed at the acute level.     Follow Up Recommendations  Follow surgeon's recommendation for DC plan and follow-up therapies (OP therapy pending progression of WB/ROM for R wrist)    Equipment Recommendations  None recommended by OT    Recommendations for Other Services       Precautions / Restrictions Precautions Precautions: Fall Required Braces or Orthoses: Other Brace Other Brace: velcro wrist brace to R UE Restrictions Weight Bearing Restrictions: Yes RUE Weight Bearing: Non weight bearing      Mobility Bed Mobility Overal bed mobility: Modified Independent                  Transfers                      Balance                                           ADL either performed or  assessed with clinical judgement   ADL Overall ADL's : Modified independent                                       General ADL Comments: Discussed modifications to perform ADLs with one UE or using R UE as light assist to maintain NWB precautions. Per nursing, pt going to bathroom independently. Pt with no difficulties managing socks, tablet in room. Discussed safety with IADLs like managing cooking, laundry (encouraged basket with wheels). Educated on elevation, ROM exercises and follow-up with OP therapy when ROM/WB status progressed     Vision Patient Visual Report: No change from baseline Vision Assessment?: No apparent visual deficits     Perception     Praxis      Pertinent Vitals/Pain Pain Assessment: Faces Faces Pain Scale: Hurts a little bit Pain Location: R wrist Pain Descriptors / Indicators: Sore Pain Intervention(s): Monitored during session     Hand Dominance Right   Extremity/Trunk Assessment Upper Extremity Assessment Upper Extremity Assessment: RUE deficits/detail RUE Deficits / Details: R UE with ace bandage and velcro wrist brace in place. Digit ROM WFL, as well as elbow and shoulder. RUE: Unable to fully assess  due to immobilization RUE Sensation: WNL   Lower Extremity Assessment Lower Extremity Assessment: Defer to PT evaluation   Cervical / Trunk Assessment Cervical / Trunk Assessment: Normal   Communication Communication Communication: No difficulties   Cognition Arousal/Alertness: Awake/alert Behavior During Therapy: WFL for tasks assessed/performed Overall Cognitive Status: Within Functional Limits for tasks assessed                                     General Comments  Encouraged elevation, ice and ROM to affected UE    Exercises     Shoulder Instructions      Home Living Family/patient expects to be discharged to:: Private residence Living Arrangements: Alone Available Help at Discharge:  Friend(s);Available PRN/intermittently Type of Home: Apartment Home Access: Stairs to enter Entrance Stairs-Number of Steps: 15 Entrance Stairs-Rails: Right;Left Home Layout: One level     Bathroom Shower/Tub: Chief Strategy Officer: Standard                Prior Functioning/Environment Level of Independence: Independent        Comments: Works at [employer], no use of AD. Independent with all ADLs, IADLs        OT Problem List:        OT Treatment/Interventions:      OT Goals(Current goals can be found in the care plan section) Acute Rehab OT Goals Patient Stated Goal: get back to work, normal activities OT Goal Formulation: All assessment and education complete, DC therapy  OT Frequency:     Barriers to D/C:            Co-evaluation              AM-PAC OT "6 Clicks" Daily Activity     Outcome Measure Help from another person eating meals?: None Help from another person taking care of personal grooming?: None Help from another person toileting, which includes using toliet, bedpan, or urinal?: None Help from another person bathing (including washing, rinsing, drying)?: None Help from another person to put on and taking off regular upper body clothing?: None Help from another person to put on and taking off regular lower body clothing?: None 6 Click Score: 24   End of Session    Activity Tolerance: Patient tolerated treatment well Patient left: in bed;with call bell/phone within reach  OT Visit Diagnosis: Muscle weakness (generalized) (M62.81);Pain Pain - Right/Left: Right Pain - part of body: Arm                Time: 2841-3244 OT Time Calculation (min): 19 min Charges:  OT General Charges $OT Visit: 1 Visit OT Evaluation $OT Eval Low Complexity: 1 Low  Bradd Canary, OTR/L Acute Rehab Services Office: [phone]  Lorre Munroe 03/27/2021, 9:14 AM

## 2021-03-27 NOTE — Progress Notes (Signed)
    Subjective: Patient reports pain as mild. Well controlled with medicines. Slept ok. Tolerating diet. Urinating. No CP, SOB. Able to mobilize OOB. Has sensation and function back in his right hand.  Objective:   VITALS:   Vitals:   03/26/21 1534 03/26/21 1549 03/26/21 1608 03/27/21 0547  BP: 119/69 105/65 132/72 130/80  Pulse: 100 (!) 102 (!) 105 76  Resp: 12 17 18 18   Temp:  98.7 F (37.1 C) 98.1 F (36.7 C) 98.4 F (36.9 C)  TempSrc:      SpO2: 96% 96% 97% 95%  Height:       CBC Latest Ref Rng & Units 03/26/2021 03/25/2021 07/06/2020  WBC 4.0 - 10.5 K/uL 9.1 8.6 5.5  Hemoglobin 13.0 - 17.0 g/dL 12.3(L) 13.6 12.6(L)  Hematocrit 39.0 - 52.0 % 37.6(L) 42.8 38.5  Platelets 150 - 400 K/uL 233 254 232   BMP Latest Ref Rng & Units 03/26/2021 03/25/2021 07/06/2020  Glucose 70 - 99 mg/dL 07/08/2020) 026(V) 75  BUN 6 - 20 mg/dL 9 9 12   Creatinine 0.61 - 1.24 mg/dL 785(Y ) 8.50  BUN/Creat Ratio 6 - 22 (calc) - - NOT APPLICABLE  Sodium 135 - 145 mmol/L 136 136 139  Potassium 3.5 - 5.1 mmol/L 3.7 3.4(L) 4.0  Chloride 98 - 111 mmol/L 106 105 107  CO2 22 - 32 mmol/L 24 23 24   Calcium 8.9 - 10.3 mg/dL 2.77(A) 9.0 8.9   Intake/Output      04/03 0701 04/04 0700 04/04 0701 04/05 0700   P.O. 500    I.V. 800    Other 0    IV Piggyback 350    Total Intake 1650    Urine 400    Emesis/NG output 0    Other 0    Stool 0    Blood 0    Total Output 400    Net +1250         Urine Occurrence 1 x    Stool Occurrence 0 x    Emesis Occurrence 0 x       Physical Exam: General: NAD. Laying in bed listening to music. Calm, conversant Resp: No increased wob Cardio: regular rate and rhythm ABD soft Neurologically intact MSK Neurovascularly intact Sensation intact distally Intact pulses distally Dorsiflexion/Plantar flexion intact Incision: dressing C/D/I, wrist brace in place   Assessment: 1 Day Post-Op  S/P Procedure(s) (LRB): IRRIGATION AND DEBRIDEMENT WRIST (Right) by Dr.  06/04. Murphy on 03/26/21  Principal Problem:   Septic arthritis (HCC) Active Problems:   Sepsis (HCC)   Plan: Continue ABX per primary team Advance diet Up with therapy Incentive Spirometry Elevate and Apply ice  Ok for nursing staff to pull TLS drain today before d/c. Redress wound after  Weightbearing: WBAT RUE Insicional and dressing care: Dressings left intact until follow-up and Reinforce dressings as needed Orthopedic device(s): removable wrist brace Showering: Keep dressing dry VTE prophylaxis: Can restart Xarelto per primary team, SCDs, ambulation Pain control: Continue current regimen Follow - up plan: 1 week in the office to have wound check and sutures removed Contact information:  06/05 MD, Jewel Baize PA-C  Dispo: Home. Ok for d/c from orthopedic standpoint. Keep dressings and wrist brace dry and in place until office f/u.     05/26/21, PA-C Office [phone] 03/27/2021, 8:26 AM

## 2021-03-27 NOTE — Consult Note (Signed)
[facility] Infect[facility]                                                                                        Patient Identification: Patient Name: Craig [name] MRN: [id number] [place] Date: 03/25/2021 11:48 AM Today's Date: 03/27/2021 Reason for consult: Septic arthritis  Requesting provider:   Principal Problem:   Septic arthritis [facility]) Active Problems:   Sepsis ([place])   Antibiotics: Vancomycin 4/2-c                    Ceftriaxone 4/2-c   Lines/Tubes: PIVs   Assessment RT Wrist septic arthritis/Tenosynovitis - no concerns of Osteomyelitis per CT and OR notes  HIV, well controlled - On biktarvy  Gout - on colchicine   Recommendations  Can switch Vancomycin to Daptomycin for better safety profile  Continue ceftriaxone Fu HIV RNA and CD4 Check Urine GC  Monitor CBC CMP and vancomycin trough on IV antibiotics ESR and CRP weekly  Will follow up cultures to make final recommendations  Will plan to treat for 4 weeks from 4/4  PICC line if blood cultures 4/2 are negative in 48 hrs  Rest of the management as per the primary team. Please call with questions or concerns.  Thank you for the consult __________________________________________________________________________________________________________ HPI and Hospital Course: 60 year old male with a past medical history of HIV well-controlled on Biktarvy, DVT on rivaroxaban, DM type II, hypertension, depression and hyperlipidemia who presented to the ED on 4/2 with complaint of right wrist pain and swelling for 2 days.  He hit his hand on something at work but denies any insect bite, trauma to the area.  He states that he had prior 2 episodes in the past when there was a concern for joint infection and it was aspirated twice but this is the first time he has surgery to his right hand.  Denies any pets at home.  He he follows with [name] at [facility]  for his HIV care and seems to be compliant to treatment.  Last viral load undetectable in July 16, 2020 and CD4 39%, 517 in July 06, 2020.  Denies being sexually active.  Denies pain in other joints of the body.   At ED, he was febrile with T-max 102.  Labs remarkable for AKI with creatinine 1.3.  Blood cultures 4/2 pending.  CT right wrist with nonspecific dorsal subcutaneous edema with possible extensor tenosynovitis.  Nonspecific radiocarpal joint effusion.  No acute osseous findings or erosive changes and no evidence of osteomyelitis.  Underwent joint aspiration on 4/2-no crystals, WBC 32,750, neutrophils 95.  Cultures no growth in 2 days.  Patient denies being on oral antibiotics before coming to the hospital.  Patient underwent I&D of right wrist with Ortho 4/4.  OR note reviewed in detail - purulent fluid in dorsal ext compartments and wrist. Drain was placed.   ROS: General- Denies fever, chills, loss of appetite and loss of weight HEENT - Denies headache, blurry vision, neck pain, sinus pain Chest - Denies any chest pain, SOB or cough CVS- Denies any dizziness/lightheadedness, syncopal attacks, palpitations Abdomen- Denies any nausea, vomiting, abdominal  pain, hematochezia and diarrhea Neuro - Denies any weakness, numbness, tingling sensation Psych - Denies any changes in mood irritability or depressive symptoms GU- Denies any burning, dysuria, hematuria or increased frequency of urination Skin - denies any rashes/lesions MSK - denies any joint pain/swelling or restricted ROM   Past Medical History:  Diagnosis Date  . AIDS ([place]) 05/24/2015  . Allergic rhinitis 01/25/2016  . Atopic dermatitis 12/30/2017  . Attack, epileptiform ([place]) 04/09/2013  . CMV (cytomegalovirus infection) ([place]) 02/13/2012  . 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 Deep vein thrombosis ([place]) 05/09/2011   Overview:  Jan 2012   . Diabetes mellitus, controlled ([place]) 01/25/2016  . Dysplasia of anus   . Esophageal reflux   .  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 Hernia, inguinal 04/09/2013  . HIV (human immunodeficiency virus infection) ([place])   . HIV (human immunodeficiency virus infection) ([place])   . Horseshoe tear of retina without detachment 02-13-12  . 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  . Molluscum contagiosum 09-29-2012  . 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 infarction   . Other convulsions   . PE (pulmonary embolism) 04/09/2013   Overview:  Jan 2012   . Personal history of venous thrombosis and embolism   . Secondary iridocyclitis, infectious   . Septic arthritis of wrist ([place])    rt  . Shortness of breath dyspnea   . Unspecified hereditary and idiopathic peripheral neuropathy   . 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 Viral URI 03/10/2018   Past Surgical History:  Procedure Laterality Date  . COLONOSCOPY    . I & D EXTREMITY Right 07/28/2015   Procedure: IRRIGATION AND DEBRIDEMENT WRIST;  Surgeon: Dayna Barker, MD;  Location: [facility];  Service: Plastics;  Laterality: Right;  . I & D EXTREMITY Right 03/26/2021   Procedure: IRRIGATION AND DEBRIDEMENT WRIST;  Surgeon: [name], MD;  Location: [facility];  Service: Orthopedics;  Laterality: Right;     Scheduled Meds: . atorvastatin  40 mg Oral Daily  . bictegravir-emtricitabine-tenofovir AF  1 tablet Oral Daily  . docusate sodium  100 mg Oral BID  . insulin aspart  0-6 Units Subcutaneous TID WC  . multivitamin with minerals  1 tablet Oral Daily  . mupirocin ointment  1 application Nasal BID  . rivaroxaban  20 mg Oral Q supper  . sertraline  50 mg Oral Daily  . traMADol  50 mg Oral Q6H   Continuous Infusions: . cefTRIAXone (ROCEPHIN)  IV 2 g (03/27/21 0948)  . methocarbamol (ROBAXIN) IV    . vancomycin 1,250 mg (03/27/21 1205)   PRN Meds:.acetaminophen, bisacodyl, diphenhydrAMINE, HYDROcodone-acetaminophen, HYDROcodone-acetaminophen, hydrOXYzine, magnesium citrate, methocarbamol **OR** methocarbamol (ROBAXIN) IV, morphine injection, ondansetron **OR**  ondansetron (ZOFRAN) IV, oxyCODONE, pantoprazole, polyethylene glycol  Allergies  Allergen Reactions  . Penicillins Hives and Rash    Has patient had a PCN reaction causing immediate rash, facial/tongue/throat swelling, SOB or lightheadedness with hypotension:YES Has patient had a PCN reaction causing Has patient had a PCN reaction that required hospitalization NO Has patient had a PCN reaction occurring within the last 10 years: YES If all of the above answers are "NO", then may proceed with Cephalosporin use.  . Shellfish Allergy Shortness Of Breath  . Latex Rash  . Metformin Nausea Only    Both IR and XR formulations   Social History   Socioeconomic History  . Marital status: Single    Spouse name: Not on file  . Number of children: Not on file  . Years of education: Not on file  .  Highest education level: Not on file  Occupational History  . Not on file  Tobacco Use  . Smoking status: Never Smoker  . Smokeless tobacco: Never Used  Vaping Use  . Vaping Use: Never used  Substance and Sexual Activity  . Alcohol use: No    Alcohol/week: 0.0 standard drinks  . Drug use: No  . Sexual activity: Not Currently    Partners: Male    Birth control/protection: Condom  Other Topics Concern  . Not on file  Social History Narrative  . Not on file   Social Determinants of Health   Financial Resource Strain: Not on file  Food Insecurity: Not on file  Transportation Needs: Not on file  Physical Activity: Not on file  Stress: Not on file  Social Connections: Not on file  Intimate Partner Violence: Not on file     Vitals BP 136/79   Pulse 91   Temp 97.7 F (36.5 C) (Oral)   Resp 16   Ht 6' (1.829 m)   SpO2 93%   BMI 32.96 kg/m    Physical Exam Constitutional:   Not in acute distress, appears to be comfortable in room air    Comments:   Cardiovascular:     Rate and Rhythm: Normal rate and regular rhythm.     Heart sounds: No murmur heard.   Pulmonary:      Effort: Pulmonary effort is normal.     Comments:   Abdominal:     Palpations: Abdomen is soft.     Tenderness: Nontender  Musculoskeletal:        General: No swelling or tenderness.  Right wrist is bandaged able to wiggle fingers  Skin:    Comments: No obvious rashes  Neurological:     General: No focal deficit present.   Psychiatric:        Mood and Affect: Mood normal.   :  Pertinent Microbiology Results for orders placed or performed during the hospital encounter of 03/25/21  Body fluid culture w Gram Stain     Status: None (Preliminary result)   Collection Time: 03/25/21  8:15 PM   Specimen: Synovium  Result Value Ref Range Status   Specimen Description SYNOVIAL FLUID  Final   Special Requests NONE  Final   Gram Stain   Final    WBC PRESENT,BOTH PMN AND MONONUCLEAR NO ORGANISMS SEEN    Culture   Final    NO GROWTH 2 DAYS Performed at [facility], [place] [address], [place], [place] [address]    Report Status PENDING  Incomplete  Resp Panel by RT-PCR (Flu A&B, Covid) Nasopharyngeal Swab     Status: None   Collection Time: 03/25/21  8:53 PM   Specimen: Nasopharyngeal Swab; Nasopharyngeal(NP) swabs in vial transport medium  Result Value Ref Range Status   SARS Coronavirus 2 by RT PCR NEGATIVE NEGATIVE Final    Comment: (NOTE) SARS-CoV-2 target nucleic acids are NOT DETECTED.  The SARS-CoV-2 RNA is generally detectable in upper respiratory specimens during the acute phase of infection. The lowest concentration of SARS-CoV-2 viral copies this assay can detect is 138 copies/mL. A negative result does not preclude SARS-Cov-2 infection and should not be used as the sole basis for treatment or other patient management decisions. A negative result may occur with  improper specimen collection/handling, submission of specimen other than nasopharyngeal swab, presence of viral mutation(s) within the areas targeted by this assay, and inadequate number of  viral copies(<138 copies/mL). A negative result  must be combined with clinical observations, patient history, and epidemiological information. The expected result is Negative.  Fact Sheet for Patients:  EntrepreneurPulse.com.au  Fact Sheet for Healthcare Providers:  IncredibleEmployment.be  This test is no t yet approved or cleared by the [place] FDA and  has been authorized for detection and/or diagnosis of SARS-CoV-2 by FDA under an Emergency Use Authorization (EUA). This EUA will remain  in effect (meaning this test can be used) for the duration of the COVID-19 declaration under Section 564(b)(1) of the Act, 21 U.S.C.section 360bbb-3(b)(1), unless the authorization is terminated  or revoked sooner.       Influenza A by PCR NEGATIVE NEGATIVE Final   Influenza B by PCR NEGATIVE NEGATIVE Final    Comment: (NOTE) The Xpert Xpress SARS-CoV-2/FLU/RSV plus assay is intended as an aid in the diagnosis of influenza from Nasopharyngeal swab specimens and should not be used as a sole basis for treatment. Nasal washings and aspirates are unacceptable for Xpert Xpress SARS-CoV-2/FLU/RSV testing.  Fact Sheet for Patients: EntrepreneurPulse.com.au  Fact Sheet for Healthcare Providers: IncredibleEmployment.be  This test is not yet approved or cleared by the [place] FDA and has been authorized for detection and/or diagnosis of SARS-CoV-2 by FDA under an Emergency Use Authorization (EUA). This EUA will remain in effect (meaning this test can be used) for the duration of the COVID-19 declaration under Section 564(b)(1) of the Act, 21 U.S.C. section 360bbb-3(b)(1), unless the authorization is terminated or revoked.  Performed at [facility], [place] [address] [place], [place] [address]   Surgical pcr screen     Status: Abnormal   Collection Time: 03/26/21  7:20 AM   Specimen: Nasal Mucosa; Nasal  Swab  Result Value Ref Range Status   MRSA, PCR NEGATIVE NEGATIVE Final   Staphylococcus aureus POSITIVE (A) NEGATIVE Final    Comment: (NOTE) The Xpert SA Assay (FDA approved for NASAL specimens in patients 66 years of age and older), is one component of a comprehensive surveillance program. It is not intended to diagnose infection nor to guide or monitor treatment. Performed at [facility], [place] [address], [place], [place] [address]       Pertinent Lab seen by me: CBC Latest Ref Rng & Units 03/27/2021 03/26/2021 03/25/2021  WBC 4.0 - 10.5 K/uL 11.5(H) 9.1 8.6  Hemoglobin 13.0 - 17.0 g/dL 12.8(L) 12.3(L) 13.6  Hematocrit 39.0 - 52.0 % 39.2 37.6(L) 42.8  Platelets 150 - 400 K/uL 257 233 254   CMP Latest Ref Rng & Units 03/27/2021 03/26/2021 03/25/2021  Glucose 70 - 99 mg/dL 189(H) 118(H) 127(H)  BUN 6 - 20 mg/dL $Remove'17 9 9  'sBHSyVB$ Creatinine 0.61 - 1.24 mg/dL 1.19 1.14 1.30(H)  Sodium 135 - 145 mmol/L 135 136 136  Potassium 3.5 - 5.1 mmol/L 3.9 3.7 3.4(L)  Chloride 98 - 111 mmol/L 105 106 105  CO2 22 - 32 mmol/L $RemoveB'23 24 23  'BfbTTBMj$ Calcium 8.9 - 10.3 mg/dL 8.8(L) 8.6(L) 9.0  Total Protein 6.5 - 8.1 g/dL 8.2(H) 7.9 -  Total Bilirubin 0.3 - 1.2 mg/dL 0.5 0.8 -  Alkaline Phos 38 - 126 U/L 69 71 -  AST 15 - 41 U/L 17 17 -  ALT 0 - 44 U/L 17 18 -     Pertinent Imagings/Other Imagings Plain films and CT images have been personally visualized and interpreted; radiology reports have been reviewed. Decision making incorporated into the Impression / Recommendations.  CT RT wrist 03/26/21 IMPRESSION: 1. Nonspecific dorsal subcutaneous edema  with possible extensor tenosynovitis as suggested on ultrasound. MRI may be helpful for further evaluation. 2. Nonspecific radiocarpal joint effusion. 3. No acute osseous findings or erosive changes. No evidence of osteomyelitis.   [place] rt upper extremity 03/26/21 IMPRESSION: Small fluid collections along the dorsum of the wrist and hand as above, the largest along  the extensor tendons which could reflect tenosynovitis. MRI may be helpful for further evaluation.  RT wrist Xray 03/25/21 IMPRESSION: 1. Soft tissue swelling over the dorsal aspect of the wrist without evidence of fracture, malalignment or erosive bony changes. 2. Small 3 mm radiopaque foreign body in the superficial soft tissues of the dorsal mid forearm overlying the ulna.  I have spent 60 minutes for this patient encounter including review of prior medical records with greater than 50% of time being face to face and coordination of their care.  Electronically signed by:   Rosiland Oz, MD Infectious Disease Physician [facility] Pager: [phone]

## 2021-03-27 NOTE — Hospital Course (Addendum)
Craig [name] is a 60 y.o. male who presented with right wrist pain concerning for septic arthritis. PMH is significant for HIV (well-controlled), diabetes, hypertension, history of DVT, HFpEF.  Septic arthritis  Patient was febrile on presentation.  Synovial fluid aspiration of the right wrist revealed 33,000 WBCs with 95% PMNs without organisms shown on Gram stain. He was started on IV abx including vancomycin and CTX on 4/2. Patient underwent incision and debridement on 4/3 and had improved range of motion following procedure.  Notably had purulent fluid in the dorsal ext compartments and wrist.  On 4/4, vancomycin was switched to daptomycin per ID recommendations.  PICC line was placed prior to discharge. OPAT was set up and he was continued on IV daptomycin and CTX, which will continue until 5/2.  HIV He was continued on his Biktarvy.  CD4 count was measured, 688.  HIV viral load was also obtained, which is pending.  Other problems chronic and stable.  Issues for f/u  Recommend further work up for IDA - outpatient colonoscopy recommended. Has chart hx of reported normal colonoscopy, unsure of last colonoscopy results.

## 2021-03-27 NOTE — Op Note (Addendum)
03/26/2021  7:57 AM  PATIENT:  Craig [name]    PRE-OPERATIVE DIAGNOSIS:  Septic Wrist Right  POST-OPERATIVE DIAGNOSIS:  Same  PROCEDURE:  IRRIGATION AND DEBRIDEMENT WRIST  SURGEON:  Sheral Apley, MD  ASSISTANT: Levester Fresh, PA-C, he was present and scrubbed throughout the case, critical for completion in a timely fashion, and for retraction, instrumentation, and closure.   ANESTHESIA:   gen  PREOPERATIVE INDICATIONS:  Craig [name] is a  60 y.o. male with a diagnosis of Septic Wrist Right who failed conservative measures and elected for surgical management.    The risks benefits and alternatives were discussed with the patient preoperatively including but not limited to the risks of infection, bleeding, nerve injury, cardiopulmonary complications, the need for revision surgery, among others, and the patient was willing to proceed.  OPERATIVE IMPLANTS: none  OPERATIVE FINDINGS: purulent fluid in dorsal ext compartments and wrist  BLOOD LOSS: min  COMPLICATIONS: none  TOURNIQUET TIME:  OPERATIVE PROCEDURE:  Patient was identified in the preoperative holding area and site was marked by me He was transported to the operating theater and placed on the table in supine position taking care to pad all bony prominences. After a preincinduction time out anesthesia was induced. The right upper extremity was prepped and draped in normal sterile fashion and a pre-incision timeout was performed. He received scheduled abx for preoperative antibiotics.   I performed a longitudinal incision over his dorsal wrist capsule and fourth extensor compartment.  No superficial fluid collection was noted.  I protected dorsal cutaneous nerves.  I next identified his fourth extensor compartment and incised the dorsal dorsal sheath of this.  Immediately noticed large blush of fluid from this compartment.  I performed a tenolysis of the EIP I also performed a tenolysis of his EDC tendon to his  for lesser digits this total 5 tendons.  Identified his terminal PIN and protected this performed a neuro lysis here.  I examined other dorsal compartments and there was no fluid collection I then went through the bed of the fourth compartment and performed a capsulotomy of his dorsal wrist.  Fluid was expressed from his wrist joint that was purulent.  I then performed a synovectomy of his wrist joint and a irrigation with 2 L of saline.  I made a dorsal ulnar nick and inserted cannula into the wrist joint for outflow after running 2 L of fluid through remove these I did use a PDS stitch to close the dorsal sheath of his fourth compartment and then placed a drain in the wrist joint prior to that.  I closed the skin and a sterile dressing was applied  Debridement type: Excisional Debridement  Side: right  Body Location: wrist and extensor tendons   Tools used for debridement: scalpel and scissors  Pre-debridement Wound size (cm):   Length: 5        Width: 2     Depth: 1   Post-debridement Wound size (cm):   Length: 5        Width: .5     Depth: 1   Debridement depth beyond dead/damaged tissue down to healthy viable tissue: yes  Tissue layer involved: skin, subcutaneous tissue, muscle / fascia  Nature of tissue removed: Purulence  Irrigation volume: 2L     Irrigation fluid type: Normal Saline       POST OPERATIVE PLAN: ABx per primary team, mobilize for dvt px and resume his previous dvt px.

## 2021-03-28 ENCOUNTER — Inpatient Hospital Stay: Payer: Self-pay

## 2021-03-28 DIAGNOSIS — B20 Human immunodeficiency virus [HIV] disease: Secondary | ICD-10-CM | POA: Diagnosis not present

## 2021-03-28 DIAGNOSIS — M659 Synovitis and tenosynovitis, unspecified: Secondary | ICD-10-CM | POA: Diagnosis not present

## 2021-03-28 DIAGNOSIS — M009 Pyogenic arthritis, unspecified: Secondary | ICD-10-CM | POA: Diagnosis not present

## 2021-03-28 LAB — CBC
HCT: 36.3 % — ABNORMAL LOW (ref 39.0–52.0)
Hemoglobin: 11.9 g/dL — ABNORMAL LOW (ref 13.0–17.0)
MCH: 29 pg (ref 26.0–34.0)
MCHC: 32.8 g/dL (ref 30.0–36.0)
MCV: 88.5 fL (ref 80.0–100.0)
Platelets: 252 10*3/uL (ref 150–400)
RBC: 4.1 MIL/uL — ABNORMAL LOW (ref 4.22–5.81)
RDW: 14.5 % (ref 11.5–15.5)
WBC: 8 10*3/uL (ref 4.0–10.5)
nRBC: 0 % (ref 0.0–0.2)

## 2021-03-28 LAB — CD4/CD8 (T-HELPER/T-SUPPRESSOR CELL)
CD4 absolute: 688 /uL (ref 400–1790)
CD4%: 43 % (ref 33–65)
CD8 T Cell Abs: 485 /uL (ref 190–1000)
CD8tox: 30 % (ref 12–40)
Ratio: 1.42 (ref 1.0–3.0)
Total lymphocyte count: 1595 /uL (ref 1000–4000)

## 2021-03-28 LAB — GLUCOSE, CAPILLARY
Glucose-Capillary: 87 mg/dL (ref 70–99)
Glucose-Capillary: 143 mg/dL — ABNORMAL HIGH (ref 70–99)
Glucose-Capillary: 72 mg/dL (ref 70–99)
Glucose-Capillary: 89 mg/dL (ref 70–99)

## 2021-03-28 LAB — CK: Total CK: 79 U/L (ref 49–397)

## 2021-03-28 MED ORDER — CHLORHEXIDINE GLUCONATE CLOTH 2 % EX PADS: 6.0000 | MEDICATED_PAD | Freq: Every day | CUTANEOUS | Status: DC

## 2021-03-28 MED ORDER — SODIUM CHLORIDE 0.9% FLUSH: 10.0000 mL | Freq: Two times a day (BID) | INTRAVENOUS | Status: DC

## 2021-03-28 MED ORDER — CEFTRIAXONE IVPB 2 GRAM MINI-BAG PLUS (COMPONENTS)
INTRAVENOUS | 0.00000 days
Start: 2021-03-28 — End: 2021-04-24

## 2021-03-28 MED ORDER — DAPTOMYCIN (CUBICIN) IVPB IN 100 ML
INTRAVENOUS | 0 days
Start: 2021-03-28 — End: 2021-04-24

## 2021-03-28 MED ORDER — CEFTRIAXONE IV (FOR PTA / DISCHARGE USE ONLY): 2.0000 g | INTRAVENOUS | 0 refills | Status: AC

## 2021-03-28 MED ORDER — SODIUM CHLORIDE 0.9% FLUSH: 10.0000 mL | INTRAVENOUS | Status: DC | PRN

## 2021-03-28 MED ORDER — DAPTOMYCIN IV (FOR PTA / DISCHARGE USE ONLY): 700.0000 mg | INTRAVENOUS | 0 refills | Status: AC

## 2021-03-28 MED ORDER — HEPARIN SOD (PORK) LOCK FLUSH 100 UNIT/ML IV SOLN
250.0000 [IU] | INTRAVENOUS | Status: AC | PRN
  Administered 2021-03-28: 250 [IU]
  Filled 2021-03-28: qty 2.5

## 2021-03-28 NOTE — TOC Progression Note (Signed)
Transition of Care [facility]) - Progression Note    Patient Details  Name: Canio Winokur MRN: [id number] Date of Birth: 29-Jun-1961  Transition of Care [facility]) CM/SW Contact  Bess Kinds, RN Phone Number: [phone] 03/28/2021, 4:26 PM  Clinical Narrative:     Spoke with patient at the bedside to discuss transition planning. Patient will need IV antibiotics x 4 weeks. ID reached out to [facility] last night - spoke with Pam at [facility] to confirm following. Advised of transition home pending PICC. HH RN arranged with Chip Boer for start of care on Thursday. Spoke with Pam about start of care - she stated that patient did very well with teaching and start of care on Thursday should be fine. Patient will need [facility] RN order with [facility]. Patient stated that he will drive himself home. No further TOC needs identified at this time.   Expected Discharge Plan: Home w Home Health Services Barriers to Discharge: Other (comment) (PICC placement)  Expected Discharge Plan and Services Expected Discharge Plan: Home w Home Health Services In-house Referral: NA Discharge Planning Services: CM Consult Post Acute Care Choice: Home Health Living arrangements for the past 2 months: Apartment                 DME Arranged: N/A DME Agency: NA       HH Arranged: RN HH Agency: [facility] Date HH Agency Contacted: 03/28/21 Time HH Agency Contacted: 1623 Representative spoke with at [facility] Agency: Angie   Social Determinants of Health (SDOH) Interventions    Readmission Risk Interventions No flowsheet data found.

## 2021-03-28 NOTE — Progress Notes (Addendum)
[facility] Infectious Diseases Follow Up Note  Patient Identification: Patient Name: Craig [name] MRN: [id number] Effort Date: 03/25/2021 11:48 AM Age: 60 y.o.Today's Date: 03/28/2021   Reason for Visit: Follow-up on septic arthritis  Principal Problem:   Septic arthritis [facility]) Active Problems:   Sepsis ([place])   Tenosynovitis  Antibiotics: Vancomycin 4/2-4, Daptomycin 4/4-c                     Ceftriaxone 4/2-c   Lines/Tubes: PIVs    Interval Events: Continues to be afebrile, no leukocytosis, no update on cultures   Assessment Wrist septic arthritis/tenosynovitis Well controlled HIV-on Biktarvy Gout   Recommendations Daptomycin and ceftriaxone.  Plan for 4 weeks from 4/12 Monitor CPK CBC and CMP ESR and CRP weekly Fu HIV RNA and CD4 PICC line to be placed if blood cultures 4/2 are negative in 48 hours OPAT orders placed A follow-up with [facility] has been made  Diagnosis: septic arthritis   Culture Result: No growth   Allergies  Allergen Reactions  . Penicillins Hives and Rash    Has patient had a PCN reaction causing immediate rash, facial/tongue/throat swelling, SOB or lightheadedness with hypotension:YES Has patient had a PCN reaction causing Has patient had a PCN reaction that required hospitalization NO Has patient had a PCN reaction occurring within the last 10 years: YES If all of the above answers are "NO", then may proceed with Cephalosporin use.  . Shellfish Allergy Shortness Of Breath  . Latex Rash  . Metformin Nausea Only    Both IR and XR formulations    OPAT Orders Discharge antibiotics to be given via PICC line Discharge antibiotics: Daptomycin and ceftriaxone Per pharmacy protocol  Aim for Vancomycin trough 15-20 or AUC 400-550 (unless otherwise indicated) Duration: 4 weeks  End Date: 04/24/21  [facility] Care Per Protocol:  Home health RN for IV administration and teaching; PICC line care and  labs.    Labs weekly while on IV antibiotics: X__ CBC with differential __ BMP X__ CMP X__ CRP X__ ESR __ Vancomycin trough X__ CK  __ Please pull PIC at completion of IV antibiotics X__ Please leave PIC in place until doctor has seen patient or been notified  Fax weekly labs to [phone]  Clinic Follow Up Appt: 4/19  $Rem'@3'UyCR$ :45 pm     Rest of the management as per the primary team. Thank you for the consult. Please page with pertinent questions or concerns.  ______________________________________________________________________ Subjective patient seen and examined at the bedside.  Denies any fever chills.  Denies any nausea vomiting and diarrhea.  No complaints today.  He is concerned about his car which is parked in front of the ED.   Vitals BP 137/76 (BP Location: Left Arm)   Pulse 98   Temp 98 F (36.7 C) (Oral)   Resp 16   Ht 6' (1.829 m)   Wt 106.4 kg   SpO2 99%   BMI 31.81 kg/m     Physical Exam Constitutional:   Not in acute distress, comfortable    Comments:   Cardiovascular:     Rate and Rhythm: Normal rate and regular rhythm.     Heart sounds: No murmur heard.   Pulmonary:     Effort: Pulmonary effort is normal.     Comments:   Abdominal:     Palpations: Abdomen is soft.     Tenderness: Nontender  Musculoskeletal:        General: Right wrist is wrapped in a bandage,  able to move right fingers  Skin:    Comments: No obvious rashes  Neurological:     General: No focal deficit present.   Psychiatric:        Mood and Affect: Mood normal.     Pertinent Microbiology Results for orders placed or performed during the hospital encounter of 03/25/21  Body fluid culture w Gram Stain     Status: None (Preliminary result)   Collection Time: 03/25/21  8:15 PM   Specimen: Synovium  Result Value Ref Range Status   Specimen Description SYNOVIAL FLUID  Final   Special Requests NONE  Final   Gram Stain   Final    WBC PRESENT,BOTH PMN AND  MONONUCLEAR NO ORGANISMS SEEN    Culture   Final    NO GROWTH 2 DAYS Performed at [facility], [place] [address], [place], [place] [address]    Report Status PENDING  Incomplete  Resp Panel by RT-PCR (Flu A&B, Covid) Nasopharyngeal Swab     Status: None   Collection Time: 03/25/21  8:53 PM   Specimen: Nasopharyngeal Swab; Nasopharyngeal(NP) swabs in vial transport medium  Result Value Ref Range Status   SARS Coronavirus 2 by RT PCR NEGATIVE NEGATIVE Final    Comment: (NOTE) SARS-CoV-2 target nucleic acids are NOT DETECTED.  The SARS-CoV-2 RNA is generally detectable in upper respiratory specimens during the acute phase of infection. The lowest concentration of SARS-CoV-2 viral copies this assay can detect is 138 copies/mL. A negative result does not preclude SARS-Cov-2 infection and should not be used as the sole basis for treatment or other patient management decisions. A negative result may occur with  improper specimen collection/handling, submission of specimen other than nasopharyngeal swab, presence of viral mutation(s) within the areas targeted by this assay, and inadequate number of viral copies(<138 copies/mL). A negative result must be combined with clinical observations, patient history, and epidemiological information. The expected result is Negative.  Fact Sheet for Patients:  EntrepreneurPulse.com.au  Fact Sheet for Healthcare Providers:  IncredibleEmployment.be  This test is no t yet approved or cleared by the [place] FDA and  has been authorized for detection and/or diagnosis of SARS-CoV-2 by FDA under an Emergency Use Authorization (EUA). This EUA will remain  in effect (meaning this test can be used) for the duration of the COVID-19 declaration under Section 564(b)(1) of the Act, 21 U.S.C.section 360bbb-3(b)(1), unless the authorization is terminated  or revoked sooner.       Influenza A by PCR NEGATIVE  NEGATIVE Final   Influenza B by PCR NEGATIVE NEGATIVE Final    Comment: (NOTE) The Xpert Xpress SARS-CoV-2/FLU/RSV plus assay is intended as an aid in the diagnosis of influenza from Nasopharyngeal swab specimens and should not be used as a sole basis for treatment. Nasal washings and aspirates are unacceptable for Xpert Xpress SARS-CoV-2/FLU/RSV testing.  Fact Sheet for Patients: EntrepreneurPulse.com.au  Fact Sheet for Healthcare Providers: IncredibleEmployment.be  This test is not yet approved or cleared by the [place] FDA and has been authorized for detection and/or diagnosis of SARS-CoV-2 by FDA under an Emergency Use Authorization (EUA). This EUA will remain in effect (meaning this test can be used) for the duration of the COVID-19 declaration under Section 564(b)(1) of the Act, 21 U.S.C. section 360bbb-3(b)(1), unless the authorization is terminated or revoked.  Performed at [facility], [place] [address] [place], [place] [address]   Surgical pcr screen     Status: Abnormal   Collection Time:  03/26/21  7:20 AM   Specimen: Nasal Mucosa; Nasal Swab  Result Value Ref Range Status   MRSA, PCR NEGATIVE NEGATIVE Final   Staphylococcus aureus POSITIVE (A) NEGATIVE Final    Comment: (NOTE) The Xpert SA Assay (FDA approved for NASAL specimens in patients 51 years of age and older), is one component of a comprehensive surveillance program. It is not intended to diagnose infection nor to guide or monitor treatment. Performed at [facility], [place] [address] [place], [place] [address]       Pertinent Lab. CBC Latest Ref Rng & Units 03/28/2021 03/27/2021 03/26/2021  WBC 4.0 - 10.5 K/uL 8.0 11.5(H) 9.1  Hemoglobin 13.0 - 17.0 g/dL 11.9(L) 12.8(L) 12.3(L)  Hematocrit 39.0 - 52.0 % 36.3(L) 39.2 37.6(L)  Platelets 150 - 400 K/uL 252 257 233   CMP Latest Ref Rng & Units 03/27/2021 03/26/2021 03/25/2021  Glucose 70 - 99 mg/dL 189(H)  118(H) 127(H)  BUN 6 - 20 mg/dL $Remove'17 9 9  'UbAHIwr$ Creatinine 0.61 - 1.24 mg/dL 1.19 1.14 1.30(H)  Sodium 135 - 145 mmol/L 135 136 136  Potassium 3.5 - 5.1 mmol/L 3.9 3.7 3.4(L)  Chloride 98 - 111 mmol/L 105 106 105  CO2 22 - 32 mmol/L $RemoveB'23 24 23  'SzcMKcel$ Calcium 8.9 - 10.3 mg/dL 8.8(L) 8.6(L) 9.0  Total Protein 6.5 - 8.1 g/dL 8.2(H) 7.9 -  Total Bilirubin 0.3 - 1.2 mg/dL 0.5 0.8 -  Alkaline Phos 38 - 126 U/L 69 71 -  AST 15 - 41 U/L 17 17 -  ALT 0 - 44 U/L 17 18 -     Pertinent Imaging today Plain films and CT images have been personally visualized and interpreted; radiology reports have been reviewed. Decision making incorporated into the Impression / Recommendations.  I have spent approx 30 minutes for this patient encounter including review of prior medical records, coordination of care  with greater than 50% of time being face to face/counseling and discussing diagnostics/treatment plan with the patient/family.  Electronically signed by:   Rosiland Oz, MD Infectious Disease Physician [facility] for Infectious Disease Pager: [phone]

## 2021-03-28 NOTE — Discharge Summary (Signed)
[facility] Discharge Summary  Patient name: Craig [name] Medical record number: [id number] Date of birth: [birth year] Age: 60 y.o. Gender: male Date of Admission: 03/25/2021  Date of Discharge: 03/28/2021  Admitting Physician: Matilde Haymaker, MD  Primary Care Provider: Toni Arthurs, PA Consultants: Orthopedic surgery  Indication for Hospitalization: Septic arthritis  Discharge Diagnoses/Problem List:  Principal Problem:   Septic arthritis [facility]) Active Problems:   Sepsis ([place])   Tenosynovitis   HIV   T2DM   History of DVT   HFpEF   Mood disorder  Disposition: home  Discharge Condition: Stable  Discharge Exam:  General: Well-appearing middle-aged male resting comfortably in bed, NAD Cardiovascular: RRR, no murmurs Respiratory: CTAB Abdomen: soft, non-tender, +BS Extremities: WWP, no edema, right wrist splint in place, right wrist with FROM without pain   Brief Hospital Course:  Craig [name] is a 60 y.o. male who presented with right wrist pain concerning for septic arthritis. PMH is significant for HIV (well-controlled), diabetes, hypertension, history of DVT, HFpEF.  Septic arthritis  Patient was febrile on presentation.  Synovial fluid aspiration of the right wrist revealed 33,000 WBCs with 95% PMNs without organisms shown on Gram stain. He was started on IV abx including vancomycin and CTX on 4/2. Patient underwent incision and debridement on 4/3 and had improved range of motion following procedure.  Notably had purulent fluid in the dorsal ext compartments and wrist.  On 4/4, vancomycin was switched to daptomycin per ID recommendations.  PICC line was placed prior to discharge. OPAT was set up and he was continued on IV daptomycin and CTX, which will continue until 5/2.  HIV He was continued on his Biktarvy.  CD4 count was measured, 688.  HIV viral load was also obtained, which is pending.  Other problems chronic and stable.  Issues for  f/u  1. Recommend further work up for [place] - outpatient colonoscopy recommended. Has chart hx of reported normal colonoscopy, unsure of last colonoscopy results.    Significant Procedures:  4/3 irrigation and debridement right wrist  Significant Labs and Imaging:  Recent Labs  Lab 03/26/21 0328 03/27/21 0828 03/28/21 0623  WBC 9.1 11.5* 8.0  HGB 12.3* 12.8* 11.9*  HCT 37.6* 39.2 36.3*  PLT 233 257 252   Recent Labs  Lab 03/25/21 1620 03/26/21 0328 03/27/21 0828  NA 136 136 135  K 3.4* 3.7 3.9  CL 105 106 105  CO2 $Re'23 24 23  'Rho$ GLUCOSE 127* 118* 189*  BUN $Re'9 9 17  'Yux$ CREATININE 1.30* 1.14 1.19  CALCIUM 9.0 8.6* 8.8*  ALKPHOS  --  71 69  AST  --  17 17  ALT  --  18 17  ALBUMIN  --  2.8* 2.7*     Results/Tests Pending at Time of Discharge:  Unresulted Labs (From admission, onward)          Start     Ordered   03/28/21 0500  HIV-1 RNA quant-no reflex-bld  Tomorrow morning,   R       Question:  Specimen collection method  Answer:  Lab=Lab collect   03/27/21 1234   03/28/21 0500  CK  Weekly,   R     Question:  Specimen collection method  Answer:  Lab=Lab collect   03/27/21 1547   03/27/21 0500  CBC  Daily,   R      03/26/21 1531   03/25/21 1758  Body fluid culture  (Arthrocentesis Panel)  ONCE - STAT,   STAT  Question:  Are there also cytology or pathology orders on this specimen?  Answer:  Yes   03/25/21 1757           Discharge Medications:  Allergies as of 03/28/2021      Reactions   Penicillins Hives, Rash   Has patient had a PCN reaction causing immediate rash, facial/tongue/throat swelling, SOB or lightheadedness with hypotension:YES Has patient had a PCN reaction causing Has patient had a PCN reaction that required hospitalization NO Has patient had a PCN reaction occurring within the last 10 years: YES If all of the above answers are "NO", then may proceed with Cephalosporin use.   Shellfish Allergy Shortness Of Breath   Latex Rash   Metformin Nausea  Only   Both IR and XR formulations      Medication List    STOP taking these medications   Accu-Chek Aviva Plus test strip Generic drug: glucose blood   Alcohol Prep 70 % Pads   Assure Comfort Lancets 30G Misc   glipiZIDE 2.5 MG 24 hr tablet Commonly known as: GLUCOTROL XL   insulin lispro 100 UNIT/ML KwikPen Commonly known as: HUMALOG   Lantus SoloStar 100 UNIT/ML Solostar Pen Generic drug: insulin glargine   traMADol 50 MG tablet Commonly known as: ULTRAM     TAKE these medications   albuterol 108 (90 Base) MCG/ACT inhaler Commonly known as: VENTOLIN HFA Inhale 2 puffs into the lungs every 6 (six) hours as needed for wheezing or shortness of breath.   atorvastatin 40 MG tablet Commonly known as: LIPITOR Take 40 mg by mouth daily.   Biktarvy 50-200-25 MG Tabs tablet Generic drug: bictegravir-emtricitabine-tenofovir AF Take 1 tablet by mouth daily.   cefTRIAXone  IVPB Commonly known as: ROCEPHIN Inject 2 g into the vein daily for 27 days. Indication:  Septic arthritis First Dose: Yes Last Day of Therapy:  04/24/21 Labs - Once weekly:  CBC/D and BMP, Labs - Every other week:  ESR and CRP Method of administration: IV Push Method of administration may be changed at the discretion of home infusion pharmacist based upon assessment of the patient and/or caregiver's ability to self-administer the medication ordered.   colchicine 0.6 MG tablet Take 1 tablet (0.6 mg total) by mouth daily.   daptomycin  IVPB Commonly known as: CUBICIN Inject 700 mg into the vein daily for 27 days. Indication:  Septic arthritis First Dose: Yes Last Day of Therapy:  04/24/21 Labs - Once weekly:  CBC/D, BMP, and CPK Labs - Every other week:  ESR and CRP Method of administration: IV Push Method of administration may be changed at the discretion of home infusion pharmacist based upon assessment of the patient and/or caregiver's ability to self-administer the medication ordered.   Dupixent  300 MG/2ML prefilled syringe Generic drug: dupilumab   hydrOXYzine 25 MG tablet Commonly known as: ATARAX/VISTARIL Take 1 tablet (25 mg total) by mouth at bedtime as needed.   lisinopril 2.5 MG tablet Commonly known as: ZESTRIL Take 2.5 mg by mouth daily.   rivaroxaban 20 MG Tabs tablet Commonly known as: XARELTO Take 20 mg by mouth daily with supper.   Rybelsus 14 MG Tabs Generic drug: Semaglutide Take 14 mg by mouth daily.   sertraline 50 MG tablet Commonly known as: ZOLOFT Take 50 mg by mouth daily.   triamcinolone ointment 0.1 % Commonly known as: KENALOG Apply topically 2 (two) times daily.            Discharge Care Instructions  (From admission,  onward)         Start     Ordered   03/28/21 0000  Change dressing on IV access line weekly and PRN  (Home infusion instructions - [facility] )        03/28/21 1250          Discharge Instructions: Please refer to Patient Instructions section of EMR for full details.  Patient was counseled important signs and symptoms that should prompt return to medical care, changes in medications, dietary instructions, activity restrictions, and follow up appointments.   Follow-Up Appointments:  Follow-up Information    [place], [place] Follow up.   Specialty: [place] Why: the office will call to arrange a visit on Thursday afternoon for start of care Contact information: [place] [address] [phone]               Zola Button, MD 03/28/2021, 6:41 PM PGY-1, [place]

## 2021-03-28 NOTE — Progress Notes (Signed)
Peripherally Inserted Central Catheter Placement  The IV Nurse has discussed with the patient and/or persons authorized to consent for the patient, the purpose of this procedure and the potential benefits and risks involved with this procedure.  The benefits include less needle sticks, lab draws from the catheter, and the patient may be discharged home with the catheter. Risks include, but not limited to, infection, bleeding, blood clot (thrombus formation), and puncture of an artery; nerve damage and irregular heartbeat and possibility to perform a PICC exchange if needed/ordered by physician.  Alternatives to this procedure were also discussed.  Bard Power PICC patient education guide, fact sheet on infection prevention and patient information card has been provided to patient /or left at bedside.    PICC Placement Documentation  PICC Single Lumen 03/28/21 PICC Left Basilic 46 cm 0 cm (Active)  Indication for Insertion or Continuance of Line Home intravenous therapies (PICC only) 03/28/21 1600  Exposed Catheter (cm) 0 cm 03/28/21 1600  Site Assessment Clean;Dry;Intact 03/28/21 1600  Line Status Flushed;Saline locked;Blood return noted 03/28/21 1600  Dressing Type Transparent;Securing device 03/28/21 1600  Dressing Status Clean;Dry;Intact 03/28/21 1600  Antimicrobial disc in place? Yes 03/28/21 1600  Safety Lock Not Applicable 03/28/21 1600  Line Care Connections checked and tightened 03/28/21 1600  Dressing Intervention New dressing 03/28/21 1600  Dressing Change Due 04/04/21 03/28/21 1600       Franne Grip Renee 03/28/2021, 4:53 PM

## 2021-03-28 NOTE — Discharge Instructions (Signed)
Dear Craig [name],   Thank you so much for allowing [place] to be part of your care!  You were admitted to [facility] for an infection in your wrist joint space.  You were treated with IV antibiotics and you had a procedure to washout the infection.  You will need to finish taking the IV antibiotics at home through your PICC line for 4 weeks total, until May 2nd.   POST-HOSPITAL & CARE INSTRUCTIONS 1. Your blood sugars are good so we stopped your glipizide and insulin. These can be restarted by your PCP if needed. 2. Please let PCP/Specialists know of any changes that were made.  3. Please see medications section of this packet for any medication changes.   DOCTOR'S APPOINTMENT & FOLLOW UP CARE INSTRUCTIONS  Future Appointments  Date Time Provider Department Center  04/21/2021  3:45 PM Odette Fraction, MD [facility]    RETURN PRECAUTIONS: Please return to the ED or call your PCP if you develop fever or severe pain in your wrist.  Take care and be well!  Family Medicine Teaching Service  [place]  [facility]  [address] [place], [place] [address] [phone]

## 2021-03-28 NOTE — Progress Notes (Signed)
PHARMACY CONSULT NOTE FOR:  OUTPATIENT  PARENTERAL ANTIBIOTIC THERAPY (OPAT)  Indication: Septic arthritis  Regimen: Daptomycin and ceftriaxone End date: 04/24/21  IV antibiotic discharge orders are pended. To discharging provider:  please sign these orders via discharge navigator,  Select New Orders & click on the button choice - Manage This Unsigned Work.    Thank you for allowing pharmacy to be a part of this patient's care.  Yvetta Coder, PharmD PGY1 Acute Care Pharmacy Resident

## 2021-03-28 NOTE — Progress Notes (Signed)
DISCHARGE NOTE HOME [name] to be discharged Home per MD order. Discussed prescriptions and follow up appointments with the patient. Prescriptions given to patient; medication list explained in detail. Patient verbalized understanding.  Skin clean, dry and intact without evidence of skin break down, no evidence of skin tears noted. Site without signs and symptoms of complications. Dressing and pressure applied. Pt denies pain at the site currently. No complaints noted.  Patient discharge with PICC on L upper arm.  An After Visit Summary (AVS) was printed and given to the patient. Patient escorted via wheelchair, and discharged home via private auto.  Margretta Sidle, RN

## 2021-03-28 NOTE — Progress Notes (Signed)
Nutrition Brief Note  Patient identified on the Malnutrition Screening Tool (MST) Report  Wt Readings from Last 15 Encounters:  03/27/21 106.4 kg  07/21/20 (!) 110.2 kg  06/02/20 104.3 kg  01/18/20 104.3 kg  01/22/19 113.9 kg  07/22/18 112.9 kg  05/22/18 104.3 kg  03/10/18 102.1 kg  12/30/17 107.5 kg  02/01/17 112.5 kg  12/31/16 113.4 kg  11/28/16 106.6 kg  06/17/16 113.1 kg  01/25/16 108.4 kg  11/22/15 112.2 kg    Body mass index is 31.81 kg/m. Patient meets criteria for obesity based on current BMI.   Current diet order is carb modified, patient is consuming approximately 75-100% of meals at this time. Labs and medications reviewed.   No nutrition interventions warranted at this time. If nutrition issues arise, please consult RD.   Eugene Gavia, MS, RD, LDN RD pager number and weekend/on-call pager number located in [place].

## 2021-03-28 NOTE — Progress Notes (Signed)
    Subjective: Patient reports pain as mild. Feeling much better than before surgery. Able to move his hand now. Drain was pulled yesterday. Tolerating diet. Urinating. No CP, SOB. Has mobilized OOB  Objective:   VITALS:   Vitals:   03/27/21 1009 03/27/21 1845 03/28/21 0405 03/28/21 0912  BP: 136/79 134/77 137/76 (!) 142/84  Pulse: 91 100 98 90  Resp: 16 16 16 16   Temp: 97.7 F (36.5 C) 97.9 F (36.6 C) 98 F (36.7 C) 98.3 F (36.8 C)  TempSrc: Oral  Oral Oral  SpO2: 93% 98% 99% 96%  Weight:      Height:       CBC Latest Ref Rng & Units 03/28/2021 03/27/2021 03/26/2021  WBC 4.0 - 10.5 K/uL 8.0 11.5(H) 9.1  Hemoglobin 13.0 - 17.0 g/dL 11.9(L) 12.8(L) 12.3(L)  Hematocrit 39.0 - 52.0 % 36.3(L) 39.2 37.6(L)  Platelets 150 - 400 K/uL 252 257 233   BMP Latest Ref Rng & Units 03/27/2021 03/26/2021 03/25/2021  Glucose 70 - 99 mg/dL 05/25/2021) 505(L) 976(B)  BUN 6 - 20 mg/dL 17 9 9   Creatinine 0.61 - 1.24 mg/dL 341(P 3.79)  BUN/Creat Ratio 6 - 22 (calc) - - -  Sodium 135 - 145 mmol/L 135 136 136  Potassium 3.5 - 5.1 mmol/L 3.9 3.7 3.4(L)  Chloride 98 - 111 mmol/L 105 106 105  CO2 22 - 32 mmol/L 23 24 23   Calcium 8.9 - 10.3 mg/dL 0.24) 0.97(D) 9.0   Intake/Output      04/04 0701 04/05 0700 04/05 0701 04/06 0700   P.O. 660    I.V. (mL/kg)     Other     IV Piggyback 50    Total Intake(mL/kg) 710 (6.7)    Urine (mL/kg/hr)     Emesis/NG output     Other     Stool     Blood     Total Output     Net +710            Physical Exam: General: NAD. Sitting up in bed eating breakfast Resp: No increased wob Cardio: regular rate and rhythm ABD soft Neurologically intact MSK Neurovascularly intact Sensation intact distally Intact pulses distally Dorsiflexion/Plantar flexion intact Incision: dressing C/D/I, ACE wrap in place, not wearing wrist brace   Assessment: 2 Days Post-Op  S/P Procedure(s) (LRB): IRRIGATION AND DEBRIDEMENT WRIST (Right) by Dr. 06/05. Murphy on  03/26/21  Principal Problem:   Septic arthritis (HCC) Active Problems:   Sepsis (HCC)   Tenosynovitis   Plan:  Advance diet Up with therapy Incentive Spirometry Elevate and Apply ice  Weightbearing: NWB RUE Insicional and dressing care: Dressings left intact until follow-up and Reinforce dressings as needed Orthopedic device(s): wrist brace Showering: Keep dressing dry VTE prophylaxis: Xarelto 15mg  , SCDs, ambulation Pain control: Continue current regimen. Minimize narcotics Follow - up plan: 7-10 days in the office to have wound check and suture removal Contact information:  06/06 MD, Jewel Baize PA-C  Dispo: Home. Ok to discharge from an orthopedic standpoint. Patient needs to keep dressings in place and dry until follow up in the office.     05/26/21, PA-C Office [phone] 03/28/2021, 9:46 AM

## 2021-03-29 LAB — HIV-1 RNA QUANT-NO REFLEX-BLD
HIV 1 RNA Quant: <20 copies/mL
LOG10 HIV-1 RNA: UNDETERMINED log10copy/mL

## 2021-03-29 LAB — BODY FLUID CULTURE W GRAM STAIN: Culture: NO GROWTH

## 2021-03-29 NOTE — Anesthesia Postprocedure Evaluation (Signed)
Anesthesia Post Note  Patient: Craig [name]  Procedure(s) Performed: IRRIGATION AND DEBRIDEMENT WRIST (Right Wrist)     Patient location during evaluation: PACU Anesthesia Type: General Level of consciousness: awake and alert Pain management: pain level controlled Vital Signs Assessment: post-procedure vital signs reviewed and stable Respiratory status: spontaneous breathing, nonlabored ventilation, respiratory function stable and patient connected to nasal cannula oxygen Cardiovascular status: blood pressure returned to baseline and stable Postop Assessment: no apparent nausea or vomiting Anesthetic complications: no   No complications documented.  Last Vitals:  Vitals:   03/28/21 0405 03/28/21 0912  BP: 137/76 (!) 142/84  Pulse: 98 90  Resp: 16 16  Temp: 36.7 C 36.8 C  SpO2: 99% 96%    Last Pain:  Vitals:   03/28/21 1810  TempSrc:   PainSc: 5                  Deon Ivey S

## 2021-03-30 ENCOUNTER — Telehealth: Payer: Self-pay

## 2021-03-30 NOTE — Telephone Encounter (Signed)
Received call from Eulogio Bear, home health nurse with [facility], wanting to confirm fax number for patient's labs. RN advised her to fax labs to either [phone] or [phone].   Sandie Ano, RN

## 2021-03-31 LAB — CULTURE, BLOOD (ROUTINE X 2)
Culture: NO GROWTH
Culture: NO GROWTH
Special Requests: ADEQUATE
Special Requests: ADEQUATE

## 2021-04-01 ENCOUNTER — Encounter
Admit: 2021-04-01 | Discharge: 2021-04-01 | Disposition: A | Discharge status: Home / Self Care | Payer: MEDICARE | Attending: Family Medicine

## 2021-04-01 DIAGNOSIS — Z4800 Encounter for change or removal of nonsurgical wound dressing: Principal | ICD-10-CM

## 2021-04-05 ENCOUNTER — Ambulatory Visit: Admit: 2021-04-05 | Discharge: 2021-04-06 | Payer: MEDICARE

## 2021-04-05 DIAGNOSIS — I2699 Other pulmonary embolism without acute cor pulmonale: Principal | ICD-10-CM

## 2021-04-05 DIAGNOSIS — F32A Depression, unspecified depression type: Principal | ICD-10-CM

## 2021-04-05 DIAGNOSIS — S40861S Insect bite (nonvenomous) of right upper arm, sequela: Principal | ICD-10-CM

## 2021-04-05 DIAGNOSIS — W57XXXS Bitten or stung by nonvenomous insect and other nonvenomous arthropods, sequela: Principal | ICD-10-CM

## 2021-04-05 DIAGNOSIS — I10 Essential (primary) hypertension: Principal | ICD-10-CM

## 2021-04-05 DIAGNOSIS — E119 Type 2 diabetes mellitus without complications: Principal | ICD-10-CM

## 2021-04-05 DIAGNOSIS — L089 Local infection of the skin and subcutaneous tissue, unspecified: Principal | ICD-10-CM

## 2021-04-05 MED ORDER — OMEPRAZOLE 40 MG CAPSULE,DELAYED RELEASE
ORAL_CAPSULE | Freq: Every day | ORAL | 5 refills | 30 days
Start: 2021-04-05 — End: ?

## 2021-04-05 MED ORDER — COLCHICINE 0.6 MG TABLET
ORAL_TABLET | 3 refills | 0 days | Status: CP
Start: 2021-04-05 — End: ?

## 2021-04-05 MED ORDER — LISINOPRIL 5 MG TABLET
ORAL_TABLET | Freq: Every day | ORAL | 3 refills | 90 days | Status: CP
Start: 2021-04-05 — End: 2022-04-05

## 2021-04-12 ENCOUNTER — Telehealth: Payer: Self-pay

## 2021-04-12 NOTE — Telephone Encounter (Signed)
Kelly with [facility] called to see if office had received labs for patient. She will be faxing labs that they have collected. Did report that CRP, CBC and ESR clotted and weren't able to be run this week. RN requested that HH attempt a re-draw so we can continue to trend numbers. HH will be returning today and again Monday for labs.   Dorie R Kogut, RN  

## 2021-04-17 ENCOUNTER — Telehealth: Payer: Self-pay

## 2021-04-17 NOTE — Telephone Encounter (Signed)
I would like to see him in clinic first.

## 2021-04-17 NOTE — Telephone Encounter (Signed)
Patient called office to ask if MD would be okay having picc line pulled before Friday appointment. States that dressing is bothering his skin and making him hot and sweaty. States that day after dressing change he has to put tape around new dressing so it does not fall off. Patient would like to be switched to oral antibiotics if possible. Informed patient that provider must see him before picc can be removed. Advise he call home health if he continues to have issues with dressing.

## 2021-04-20 ENCOUNTER — Telehealth: Payer: Self-pay

## 2021-04-20 NOTE — Telephone Encounter (Signed)
Received call from home health RN stating patient has a lot of anxiety over his picc line. Reports Picc line is out about 2cm, continues to flush well, and patient denies any pain associated with picc line. Staff consulted with Dr. Luciana Axe regarding situation and per MD ok to maintain and use picc line for now. Picc line will be reassessed during office visit tomorrow.  Valarie Cones

## 2021-04-21 ENCOUNTER — Ambulatory Visit (INDEPENDENT_AMBULATORY_CARE_PROVIDER_SITE_OTHER): Payer: Medicare Other | Admitting: Internal Medicine

## 2021-04-21 ENCOUNTER — Other Ambulatory Visit: Payer: Self-pay

## 2021-04-21 ENCOUNTER — Encounter: Payer: Self-pay | Admitting: Internal Medicine

## 2021-04-21 VITALS — BP 115/77 | HR 111 | Temp 98.2°F | Wt 234.0 lb

## 2021-04-21 DIAGNOSIS — M009 Pyogenic arthritis, unspecified: Secondary | ICD-10-CM | POA: Diagnosis not present

## 2021-04-21 DIAGNOSIS — Z21 Asymptomatic human immunodeficiency virus [HIV] infection status: Secondary | ICD-10-CM

## 2021-04-21 DIAGNOSIS — Z452 Encounter for adjustment and management of vascular access device: Secondary | ICD-10-CM | POA: Diagnosis not present

## 2021-04-21 DIAGNOSIS — M659 Synovitis and tenosynovitis, unspecified: Secondary | ICD-10-CM | POA: Diagnosis not present

## 2021-04-21 NOTE — Assessment & Plan Note (Signed)
He will continue with yearly follow up and due in July with Dr. Daiva Eves.  He will schedule today

## 2021-04-21 NOTE — Assessment & Plan Note (Signed)
At this point, wrist looks clinically very good, good movement, no warmth or concerns.  He has been on antibiotics post surgical nearly 4 weeks and appears healed so no further antibiotic treatment indicated.  Will notify home health.

## 2021-04-21 NOTE — Progress Notes (Signed)
   Subjective:    Patient ID: Craig [name], male    DOB: [birth year], 60 y.o.   MRN: [id number]  HPI Here for hsfu He has a history of well controlled HIV on Biktarvy followed by Dr. Daiva Eves and was hospitalized earlier this month with right wrist septic arthritis with tenosynovitis.  He was debrided operatively by Dr. Wandra Feinstein on 03/27/21 and started on empiric antibiotics with vancomycin and ceftriaxone for 4 weeks.  No cultures grew anything.  He has had no associated rash or diarrhea.  picc came out about 2 cm yesterday.  His wrist movement is good, incision closed and no issues.     Review of Systems  Constitutional: Negative for chills and fever.  Gastrointestinal: Negative for diarrhea and nausea.  Skin: Negative for rash.       Objective:   Physical Exam Eyes:     General: No scleral icterus. Pulmonary:     Effort: Pulmonary effort is normal.  Skin:    Findings: No rash.  Neurological:     General: No focal deficit present.     Mental Status: He is alert.  Psychiatric:        Mood and Affect: Mood normal.   SH: no tobacco        Assessment & Plan:

## 2021-04-21 NOTE — Progress Notes (Signed)
Per verbal order from Dr Luciana Axe, 46 cm Single Lumen Peripherally Inserted Central Catheter removed from left basilic, tip intact. No sutures present. RN confirmed length per chart. Dressing was clean and dry. Petroleum dressing applied. Pt advised no heavy lifting with this arm, leave dressing for 24 hours and call the office or seek emergent care if dressing becomes soaked with blood or swelling or sharp pain presents. Patient verbalized understanding and agreement.  Patient's questions answered to their satisfaction. Patient tolerated procedure well, RN walked patient to check out. Pharmacy notified by Aundra Millet, RN

## 2021-04-21 NOTE — Assessment & Plan Note (Signed)
picc line removed today in clinic and precautions given.

## 2021-05-08 ENCOUNTER — Encounter: Admit: 2021-05-08 | Discharge: 2021-05-09 | Payer: MEDICARE

## 2021-05-08 DIAGNOSIS — I10 Essential (primary) hypertension: Principal | ICD-10-CM

## 2021-05-08 DIAGNOSIS — I2699 Other pulmonary embolism without acute cor pulmonale: Principal | ICD-10-CM

## 2021-05-08 DIAGNOSIS — E119 Type 2 diabetes mellitus without complications: Principal | ICD-10-CM

## 2021-05-24 ENCOUNTER — Ambulatory Visit: Admit: 2021-05-24 | Discharge: 2021-05-25 | Payer: MEDICARE

## 2021-05-24 MED ORDER — EMPAGLIFLOZIN 25 MG TABLET
ORAL_TABLET | Freq: Every morning | ORAL | 3 refills | 90.00000 days | Status: CP
Start: 2021-05-24 — End: ?

## 2021-06-07 ENCOUNTER — Other Ambulatory Visit: Payer: Self-pay | Admitting: Infectious Diseases

## 2021-06-07 DIAGNOSIS — Z21 Asymptomatic human immunodeficiency virus [HIV] infection status: Secondary | ICD-10-CM

## 2021-06-21 ENCOUNTER — Other Ambulatory Visit: Payer: Self-pay | Admitting: Family

## 2021-06-21 DIAGNOSIS — Z21 Asymptomatic human immunodeficiency virus [HIV] infection status: Secondary | ICD-10-CM

## 2021-06-24 ENCOUNTER — Emergency Department: Payer: Medicare Other

## 2021-06-24 ENCOUNTER — Emergency Department
Admission: EM | Admit: 2021-06-24 | Discharge: 2021-06-24 | Disposition: A | Discharge status: Home / Self Care | Payer: Medicare Other | Attending: Emergency Medicine | Admitting: Emergency Medicine

## 2021-06-24 ENCOUNTER — Other Ambulatory Visit: Payer: Self-pay

## 2021-06-24 DIAGNOSIS — Z7901 Long term (current) use of anticoagulants: Secondary | ICD-10-CM | POA: Diagnosis not present

## 2021-06-24 DIAGNOSIS — M542 Cervicalgia: Secondary | ICD-10-CM | POA: Insufficient documentation

## 2021-06-24 DIAGNOSIS — I110 Hypertensive heart disease with heart failure: Secondary | ICD-10-CM | POA: Diagnosis not present

## 2021-06-24 DIAGNOSIS — I5032 Chronic diastolic (congestive) heart failure: Secondary | ICD-10-CM | POA: Diagnosis not present

## 2021-06-24 DIAGNOSIS — Y92410 Unspecified street and highway as the place of occurrence of the external cause: Secondary | ICD-10-CM | POA: Diagnosis not present

## 2021-06-24 DIAGNOSIS — K469 Unspecified abdominal hernia without obstruction or gangrene: Secondary | ICD-10-CM | POA: Diagnosis not present

## 2021-06-24 DIAGNOSIS — Z79899 Other long term (current) drug therapy: Secondary | ICD-10-CM | POA: Diagnosis not present

## 2021-06-24 DIAGNOSIS — S0993XA Unspecified injury of face, initial encounter: Secondary | ICD-10-CM | POA: Diagnosis present

## 2021-06-24 DIAGNOSIS — J45909 Unspecified asthma, uncomplicated: Secondary | ICD-10-CM | POA: Insufficient documentation

## 2021-06-24 DIAGNOSIS — M546 Pain in thoracic spine: Secondary | ICD-10-CM | POA: Diagnosis not present

## 2021-06-24 DIAGNOSIS — Z91040 Latex allergy status: Secondary | ICD-10-CM | POA: Diagnosis not present

## 2021-06-24 DIAGNOSIS — E1140 Type 2 diabetes mellitus with diabetic neuropathy, unspecified: Secondary | ICD-10-CM | POA: Insufficient documentation

## 2021-06-24 DIAGNOSIS — R079 Chest pain, unspecified: Secondary | ICD-10-CM | POA: Insufficient documentation

## 2021-06-24 DIAGNOSIS — I672 Cerebral atherosclerosis: Secondary | ICD-10-CM | POA: Diagnosis not present

## 2021-06-24 DIAGNOSIS — Z7984 Long term (current) use of oral hypoglycemic drugs: Secondary | ICD-10-CM | POA: Insufficient documentation

## 2021-06-24 DIAGNOSIS — Z21 Asymptomatic human immunodeficiency virus [HIV] infection status: Secondary | ICD-10-CM | POA: Diagnosis not present

## 2021-06-24 DIAGNOSIS — M549 Dorsalgia, unspecified: Secondary | ICD-10-CM

## 2021-06-24 LAB — CBC
HCT: 40.2 % (ref 39.0–52.0)
Hemoglobin: 13.1 g/dL (ref 13.0–17.0)
MCH: 28.5 pg (ref 26.0–34.0)
MCHC: 32.6 g/dL (ref 30.0–36.0)
MCV: 87.6 fL (ref 80.0–100.0)
Platelets: 258 10*3/uL (ref 150–400)
RBC: 4.59 MIL/uL (ref 4.22–5.81)
RDW: 14.9 % (ref 11.5–15.5)
WBC: 8.2 10*3/uL (ref 4.0–10.5)
nRBC: 0 % (ref 0.0–0.2)

## 2021-06-24 LAB — BASIC METABOLIC PANEL
Anion gap: 6 (ref 5–15)
BUN: 10 mg/dL (ref 6–20)
CO2: 24 mmol/L (ref 22–32)
Calcium: 9 mg/dL (ref 8.9–10.3)
Chloride: 105 mmol/L (ref 98–111)
Creatinine, Ser: 1.23 mg/dL (ref 0.61–1.24)
GFR, Estimated: >60 mL/min (ref >60)
Glucose, Bld: 99 mg/dL (ref 70–99)
Potassium: 3.8 mmol/L (ref 3.5–5.1)
Sodium: 135 mmol/L (ref 135–145)

## 2021-06-24 LAB — TROPONIN I (HIGH SENSITIVITY)
Troponin I (High Sensitivity): 6 ng/L (ref <18)
Troponin I (High Sensitivity): 5 ng/L (ref <18)

## 2021-06-24 MED ORDER — IOHEXOL 300 MG/ML  SOLN
100.0000 mL | Freq: Once | INTRAMUSCULAR | Status: AC | PRN
  Administered 2021-06-24: 100 mL via INTRAVENOUS
  Filled 2021-06-24: qty 100

## 2021-06-24 MED ORDER — METHOCARBAMOL 750 MG PO TABS: 750.0000 mg | ORAL_TABLET | Freq: Four times a day (QID) | ORAL | 0 refills | Status: AC | PRN

## 2021-06-24 MED ORDER — ACETAMINOPHEN 325 MG PO TABS
650.0000 mg | ORAL_TABLET | Freq: Once | ORAL | Status: AC
  Administered 2021-06-24: 650 mg via ORAL
  Filled 2021-06-24: qty 2

## 2021-06-24 MED ORDER — METHOCARBAMOL 500 MG PO TABS
750.0000 mg | ORAL_TABLET | Freq: Once | ORAL | Status: AC
  Administered 2021-06-24: 750 mg via ORAL
  Filled 2021-06-24: qty 2
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
## 2021-06-24 NOTE — ED Notes (Signed)
Pt taken to treatment via wheelchair- pt states he still feels soreness in his chest
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
## 2021-06-24 NOTE — Discharge Instructions (Addendum)
Please take Tylenol, up to 650 mg up to 4 times per day. You may also use the prescribed Robaxin, a muscle relaxant, for your pain. Return to the ER if you develop any worsening of symptoms, otherwise follow up with your primary care provider.

## 2021-06-24 NOTE — ED Notes (Signed)
Pt to xray
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
## 2021-06-28 ENCOUNTER — Other Ambulatory Visit: Payer: Self-pay

## 2021-06-28 ENCOUNTER — Encounter: Payer: Self-pay | Admitting: Infectious Disease

## 2021-06-28 ENCOUNTER — Ambulatory Visit (INDEPENDENT_AMBULATORY_CARE_PROVIDER_SITE_OTHER): Payer: Medicare Other | Admitting: Infectious Disease

## 2021-06-28 VITALS — BP 114/75 | HR 100 | Resp 16 | Ht 72.0 in | Wt 239.0 lb

## 2021-06-28 DIAGNOSIS — Z7901 Long term (current) use of anticoagulants: Secondary | ICD-10-CM

## 2021-06-28 DIAGNOSIS — B20 Human immunodeficiency virus [HIV] disease: Secondary | ICD-10-CM | POA: Diagnosis not present

## 2021-06-28 DIAGNOSIS — E119 Type 2 diabetes mellitus without complications: Secondary | ICD-10-CM

## 2021-06-28 DIAGNOSIS — I5032 Chronic diastolic (congestive) heart failure: Secondary | ICD-10-CM | POA: Diagnosis not present

## 2021-06-28 DIAGNOSIS — M659 Synovitis and tenosynovitis, unspecified: Secondary | ICD-10-CM

## 2021-06-28 DIAGNOSIS — I10 Essential (primary) hypertension: Secondary | ICD-10-CM

## 2021-06-28 DIAGNOSIS — M009 Pyogenic arthritis, unspecified: Secondary | ICD-10-CM

## 2021-06-28 DIAGNOSIS — Z794 Long term (current) use of insulin: Secondary | ICD-10-CM

## 2021-06-28 MED ORDER — BIKTARVY 50-200-25 MG PO TABS: 1.0000 | ORAL_TABLET | Freq: Every day | ORAL | 11 refills | Status: DC
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
## 2021-06-30 LAB — HIV-1 RNA QUANT-NO REFLEX-BLD
HIV 1 RNA Quant: NOT DETECTED Copies/mL
HIV-1 RNA Quant, Log: NOT DETECTED Log cps/mL

## 2021-07-06 ENCOUNTER — Encounter (HOSPITAL_COMMUNITY): Payer: Self-pay | Admitting: Emergency Medicine

## 2021-07-06 ENCOUNTER — Other Ambulatory Visit: Payer: Self-pay

## 2021-07-06 ENCOUNTER — Ambulatory Visit (HOSPITAL_COMMUNITY)
Admission: EM | Admit: 2021-07-06 | Discharge: 2021-07-06 | Disposition: A | Discharge status: Home / Self Care | Payer: Medicare Other | Attending: Emergency Medicine | Admitting: Emergency Medicine

## 2021-07-06 DIAGNOSIS — M62830 Muscle spasm of back: Secondary | ICD-10-CM | POA: Diagnosis not present

## 2021-07-06 DIAGNOSIS — F419 Anxiety disorder, unspecified: Secondary | ICD-10-CM

## 2021-07-06 MED ORDER — TIZANIDINE HCL 4 MG PO TABS: 4.0000 mg | ORAL_TABLET | Freq: Four times a day (QID) | ORAL | 0 refills | Status: DC | PRN

## 2021-07-06 MED ORDER — HYDROXYZINE HCL 25 MG PO TABS: 25.0000 mg | ORAL_TABLET | Freq: Every evening | ORAL | 0 refills | Status: AC | PRN
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
## 2021-07-06 NOTE — Discharge Instructions (Addendum)
You can take the Zanaflex as needed for muscle pain and spasms.  Do not take it prior to driving as it can make you sleepy.  You can take Tylenol and/or Ibuprofen as needed for pain relief and fever reduction.   You can use heat, ice, or alternate between heat and ice for comfort.  You can also try IcyHot, lidocaine patches, or Voltaren gel for comfort.    Take the hydroxyzine as needed for anxiety and insomnia.   You can follow up with [facility] for further evaluation of depression and anxiety.    Return or go to the Emergency Department if symptoms worsen or do not improve in the next few days.
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
## 2021-07-30 ENCOUNTER — Encounter: Admit: 2021-07-30 | Discharge: 2021-07-30 | Disposition: A | Discharge status: Home / Self Care | Payer: MEDICARE

## 2021-07-30 ENCOUNTER — Encounter
Admit: 2021-07-30 | Discharge: 2021-07-30 | Disposition: A | Discharge status: Home / Self Care | Payer: MEDICARE | Attending: Family Medicine

## 2021-07-30 DIAGNOSIS — L309 Dermatitis, unspecified: Principal | ICD-10-CM

## 2021-07-30 DIAGNOSIS — R42 Dizziness and giddiness: Principal | ICD-10-CM

## 2021-08-07 ENCOUNTER — Encounter: Admit: 2021-08-07 | Payer: MEDICARE

## 2021-08-13 ENCOUNTER — Emergency Department (HOSPITAL_COMMUNITY): Payer: Medicare Other

## 2021-08-13 ENCOUNTER — Inpatient Hospital Stay (HOSPITAL_COMMUNITY)
Admission: EM | Admit: 2021-08-13 | Discharge: 2021-08-15 | DRG: 683 | Disposition: A | Discharge status: Home / Self Care | Payer: Medicare Other | Attending: Internal Medicine | Admitting: Internal Medicine

## 2021-08-13 ENCOUNTER — Encounter (HOSPITAL_COMMUNITY): Payer: Self-pay | Admitting: *Deleted

## 2021-08-13 ENCOUNTER — Other Ambulatory Visit: Payer: Self-pay

## 2021-08-13 DIAGNOSIS — M7989 Other specified soft tissue disorders: Secondary | ICD-10-CM | POA: Diagnosis present

## 2021-08-13 DIAGNOSIS — Z794 Long term (current) use of insulin: Secondary | ICD-10-CM

## 2021-08-13 DIAGNOSIS — Z91013 Allergy to seafood: Secondary | ICD-10-CM

## 2021-08-13 DIAGNOSIS — R0789 Other chest pain: Secondary | ICD-10-CM | POA: Diagnosis present

## 2021-08-13 DIAGNOSIS — Z20822 Contact with and (suspected) exposure to covid-19: Secondary | ICD-10-CM | POA: Diagnosis present

## 2021-08-13 DIAGNOSIS — E1140 Type 2 diabetes mellitus with diabetic neuropathy, unspecified: Secondary | ICD-10-CM | POA: Diagnosis present

## 2021-08-13 DIAGNOSIS — L309 Dermatitis, unspecified: Secondary | ICD-10-CM | POA: Diagnosis present

## 2021-08-13 DIAGNOSIS — Z91040 Latex allergy status: Secondary | ICD-10-CM

## 2021-08-13 DIAGNOSIS — I5032 Chronic diastolic (congestive) heart failure: Secondary | ICD-10-CM | POA: Diagnosis present

## 2021-08-13 DIAGNOSIS — Z833 Family history of diabetes mellitus: Secondary | ICD-10-CM

## 2021-08-13 DIAGNOSIS — R000 Tachycardia, unspecified: Secondary | ICD-10-CM | POA: Diagnosis present

## 2021-08-13 DIAGNOSIS — Z8673 Personal history of transient ischemic attack (TIA), and cerebral infarction without residual deficits: Secondary | ICD-10-CM

## 2021-08-13 DIAGNOSIS — Z86718 Personal history of other venous thrombosis and embolism: Secondary | ICD-10-CM

## 2021-08-13 DIAGNOSIS — I10 Essential (primary) hypertension: Secondary | ICD-10-CM | POA: Diagnosis present

## 2021-08-13 DIAGNOSIS — R079 Chest pain, unspecified: Secondary | ICD-10-CM | POA: Diagnosis present

## 2021-08-13 DIAGNOSIS — Z7984 Long term (current) use of oral hypoglycemic drugs: Secondary | ICD-10-CM

## 2021-08-13 DIAGNOSIS — Z86711 Personal history of pulmonary embolism: Secondary | ICD-10-CM

## 2021-08-13 DIAGNOSIS — I252 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 infarction: Secondary | ICD-10-CM

## 2021-08-13 DIAGNOSIS — R0609 Other forms of dyspnea: Secondary | ICD-10-CM | POA: Diagnosis present

## 2021-08-13 DIAGNOSIS — Z888 Allergy status to other drugs, medicaments and biological substances status: Secondary | ICD-10-CM

## 2021-08-13 DIAGNOSIS — R55 Syncope and collapse: Secondary | ICD-10-CM | POA: Diagnosis present

## 2021-08-13 DIAGNOSIS — I110 Hypertensive heart disease with heart failure: Secondary | ICD-10-CM | POA: Diagnosis present

## 2021-08-13 DIAGNOSIS — Z880 Allergy status to penicillin: Secondary | ICD-10-CM

## 2021-08-13 DIAGNOSIS — E1151 Type 2 diabetes mellitus with diabetic peripheral angiopathy without gangrene: Secondary | ICD-10-CM | POA: Diagnosis present

## 2021-08-13 DIAGNOSIS — Z7901 Long term (current) use of anticoagulants: Secondary | ICD-10-CM

## 2021-08-13 DIAGNOSIS — B20 Human immunodeficiency virus [HIV] disease: Secondary | ICD-10-CM | POA: Diagnosis present

## 2021-08-13 DIAGNOSIS — E785 Hyperlipidemia, unspecified: Secondary | ICD-10-CM | POA: Diagnosis present

## 2021-08-13 DIAGNOSIS — Z79899 Other long term (current) drug therapy: Secondary | ICD-10-CM

## 2021-08-13 DIAGNOSIS — N179 Acute kidney failure, unspecified: Principal | ICD-10-CM | POA: Diagnosis present

## 2021-08-13 DIAGNOSIS — K219 Gastro-esophageal reflux disease without esophagitis: Secondary | ICD-10-CM | POA: Diagnosis present

## 2021-08-13 DIAGNOSIS — E119 Type 2 diabetes mellitus without complications: Secondary | ICD-10-CM

## 2021-08-13 LAB — CBC
HCT: 40.3 % (ref 39.0–52.0)
Hemoglobin: 12.9 g/dL — ABNORMAL LOW (ref 13.0–17.0)
MCH: 29.1 pg (ref 26.0–34.0)
MCHC: 32 g/dL (ref 30.0–36.0)
MCV: 91 fL (ref 80.0–100.0)
Platelets: 413 10*3/uL — ABNORMAL HIGH (ref 150–400)
RBC: 4.43 MIL/uL (ref 4.22–5.81)
RDW: 15.1 % (ref 11.5–15.5)
WBC: 8.5 10*3/uL (ref 4.0–10.5)
nRBC: 0 % (ref 0.0–0.2)

## 2021-08-13 MED ORDER — HYDROMORPHONE HCL 1 MG/ML IJ SOLN
0.5000 mg | Freq: Once | INTRAMUSCULAR | Status: AC
  Administered 2021-08-13: 0.5 mg via INTRAVENOUS
  Filled 2021-08-13: qty 1

## 2021-08-13 MED ORDER — SODIUM CHLORIDE 0.9 % IV BOLUS
1000.0000 mL | Freq: Once | INTRAVENOUS | Status: AC
Start: 2021-08-13 — End: 2021-08-14
  Administered 2021-08-13: 1000 mL via INTRAVENOUS
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
## 2021-08-14 ENCOUNTER — Observation Stay (HOSPITAL_COMMUNITY): Payer: Medicare Other

## 2021-08-14 ENCOUNTER — Inpatient Hospital Stay (HOSPITAL_COMMUNITY): Payer: Medicare Other

## 2021-08-14 ENCOUNTER — Emergency Department (HOSPITAL_COMMUNITY): Payer: Medicare Other

## 2021-08-14 DIAGNOSIS — R609 Edema, unspecified: Secondary | ICD-10-CM

## 2021-08-14 DIAGNOSIS — M79606 Pain in leg, unspecified: Secondary | ICD-10-CM | POA: Diagnosis not present

## 2021-08-14 DIAGNOSIS — E785 Hyperlipidemia, unspecified: Secondary | ICD-10-CM | POA: Diagnosis present

## 2021-08-14 DIAGNOSIS — I252 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 infarction: Secondary | ICD-10-CM | POA: Diagnosis not present

## 2021-08-14 DIAGNOSIS — B20 Human immunodeficiency virus [HIV] disease: Secondary | ICD-10-CM | POA: Diagnosis present

## 2021-08-14 DIAGNOSIS — R079 Chest pain, unspecified: Secondary | ICD-10-CM | POA: Diagnosis not present

## 2021-08-14 DIAGNOSIS — K219 Gastro-esophageal reflux disease without esophagitis: Secondary | ICD-10-CM | POA: Diagnosis present

## 2021-08-14 DIAGNOSIS — Z86711 Personal history of pulmonary embolism: Secondary | ICD-10-CM

## 2021-08-14 DIAGNOSIS — I495 Sick sinus syndrome: Secondary | ICD-10-CM | POA: Diagnosis not present

## 2021-08-14 DIAGNOSIS — E1140 Type 2 diabetes mellitus with diabetic neuropathy, unspecified: Secondary | ICD-10-CM | POA: Diagnosis present

## 2021-08-14 DIAGNOSIS — I10 Essential (primary) hypertension: Secondary | ICD-10-CM

## 2021-08-14 DIAGNOSIS — N179 Acute kidney failure, unspecified: Secondary | ICD-10-CM | POA: Diagnosis present

## 2021-08-14 DIAGNOSIS — Z880 Allergy status to penicillin: Secondary | ICD-10-CM | POA: Diagnosis not present

## 2021-08-14 DIAGNOSIS — Z91040 Latex allergy status: Secondary | ICD-10-CM | POA: Diagnosis not present

## 2021-08-14 DIAGNOSIS — L309 Dermatitis, unspecified: Secondary | ICD-10-CM | POA: Diagnosis present

## 2021-08-14 DIAGNOSIS — Z79899 Other long term (current) drug therapy: Secondary | ICD-10-CM | POA: Diagnosis not present

## 2021-08-14 DIAGNOSIS — E1151 Type 2 diabetes mellitus with diabetic peripheral angiopathy without gangrene: Secondary | ICD-10-CM | POA: Diagnosis present

## 2021-08-14 DIAGNOSIS — M7989 Other specified soft tissue disorders: Secondary | ICD-10-CM | POA: Diagnosis present

## 2021-08-14 DIAGNOSIS — Z794 Long term (current) use of insulin: Secondary | ICD-10-CM | POA: Diagnosis not present

## 2021-08-14 DIAGNOSIS — R55 Syncope and collapse: Secondary | ICD-10-CM | POA: Diagnosis not present

## 2021-08-14 DIAGNOSIS — Z91013 Allergy to seafood: Secondary | ICD-10-CM | POA: Diagnosis not present

## 2021-08-14 DIAGNOSIS — R000 Tachycardia, unspecified: Secondary | ICD-10-CM | POA: Diagnosis present

## 2021-08-14 DIAGNOSIS — Z888 Allergy status to other drugs, medicaments and biological substances status: Secondary | ICD-10-CM | POA: Diagnosis not present

## 2021-08-14 DIAGNOSIS — Z8673 Personal history of transient ischemic attack (TIA), and cerebral infarction without residual deficits: Secondary | ICD-10-CM | POA: Diagnosis not present

## 2021-08-14 DIAGNOSIS — I110 Hypertensive heart disease with heart failure: Secondary | ICD-10-CM | POA: Diagnosis present

## 2021-08-14 DIAGNOSIS — Z20822 Contact with and (suspected) exposure to covid-19: Secondary | ICD-10-CM | POA: Diagnosis present

## 2021-08-14 DIAGNOSIS — E119 Type 2 diabetes mellitus without complications: Secondary | ICD-10-CM

## 2021-08-14 DIAGNOSIS — Z833 Family history of diabetes mellitus: Secondary | ICD-10-CM | POA: Diagnosis not present

## 2021-08-14 DIAGNOSIS — Z86718 Personal history of other venous thrombosis and embolism: Secondary | ICD-10-CM | POA: Diagnosis not present

## 2021-08-14 DIAGNOSIS — Z21 Asymptomatic human immunodeficiency virus [HIV] infection status: Secondary | ICD-10-CM

## 2021-08-14 DIAGNOSIS — I5032 Chronic diastolic (congestive) heart failure: Secondary | ICD-10-CM | POA: Diagnosis present

## 2021-08-14 DIAGNOSIS — Z7901 Long term (current) use of anticoagulants: Secondary | ICD-10-CM

## 2021-08-14 DIAGNOSIS — R0789 Other chest pain: Secondary | ICD-10-CM | POA: Diagnosis present

## 2021-08-14 LAB — HEMOGLOBIN A1C
Hgb A1c MFr Bld: 6.2 % — ABNORMAL HIGH (ref 4.8–5.6)
Mean Plasma Glucose: 131.24 mg/dL

## 2021-08-14 LAB — HEPATIC FUNCTION PANEL
ALT: 22 U/L (ref 0–44)
AST: 36 U/L (ref 15–41)
Albumin: 3.3 g/dL — ABNORMAL LOW (ref 3.5–5.0)
Alkaline Phosphatase: 88 U/L (ref 38–126)
Bilirubin, Direct: 0.1 mg/dL (ref 0.0–0.2)
Indirect Bilirubin: 0.7 mg/dL (ref 0.3–0.9)
Total Bilirubin: 0.8 mg/dL (ref 0.3–1.2)
Total Protein: 8.7 g/dL — ABNORMAL HIGH (ref 6.5–8.1)

## 2021-08-14 LAB — BASIC METABOLIC PANEL
Anion gap: 10 (ref 5–15)
Anion gap: 8 (ref 5–15)
BUN: 19 mg/dL (ref 6–20)
BUN: 16 mg/dL (ref 6–20)
CO2: 21 mmol/L — ABNORMAL LOW (ref 22–32)
CO2: 18 mmol/L — ABNORMAL LOW (ref 22–32)
Calcium: 9.1 mg/dL (ref 8.9–10.3)
Calcium: 8.2 mg/dL — ABNORMAL LOW (ref 8.9–10.3)
Chloride: 102 mmol/L (ref 98–111)
Chloride: 107 mmol/L (ref 98–111)
Creatinine, Ser: 2.32 mg/dL — ABNORMAL HIGH (ref 0.61–1.24)
Creatinine, Ser: 1.75 mg/dL — ABNORMAL HIGH (ref 0.61–1.24)
GFR, Estimated: 32 mL/min — ABNORMAL LOW (ref >60)
GFR, Estimated: 45 mL/min — ABNORMAL LOW (ref >60)
Glucose, Bld: 95 mg/dL (ref 70–99)
Glucose, Bld: 95 mg/dL (ref 70–99)
Potassium: 4.8 mmol/L (ref 3.5–5.1)
Potassium: 5.4 mmol/L — ABNORMAL HIGH (ref 3.5–5.1)
Sodium: 133 mmol/L — ABNORMAL LOW (ref 135–145)
Sodium: 133 mmol/L — ABNORMAL LOW (ref 135–145)

## 2021-08-14 LAB — ECHOCARDIOGRAM COMPLETE
AR max vel: 3.81 cm2
AV Area VTI: 3.55 cm2
AV Area mean vel: 3.49 cm2
AV Mean grad: 5 mmHg
AV Peak grad: 8.2 mmHg
Ao pk vel: 1.43 m/s
Height: 72 in
S' Lateral: 2.5 cm
Weight: 3824 oz

## 2021-08-14 LAB — URINALYSIS, COMPLETE (UACMP) WITH MICROSCOPIC
Bacteria, UA: NONE SEEN
Bilirubin Urine: NEGATIVE
Glucose, UA: 150 mg/dL — AB
Hgb urine dipstick: NEGATIVE
Ketones, ur: NEGATIVE mg/dL
Leukocytes,Ua: NEGATIVE
Nitrite: NEGATIVE
Protein, ur: 30 mg/dL — AB
Specific Gravity, Urine: 1.027 (ref 1.005–1.030)
pH: 5 (ref 5.0–8.0)

## 2021-08-14 LAB — GLUCOSE, CAPILLARY
Glucose-Capillary: 89 mg/dL (ref 70–99)
Glucose-Capillary: 93 mg/dL (ref 70–99)

## 2021-08-14 LAB — CBG MONITORING, ED
Glucose-Capillary: 78 mg/dL (ref 70–99)
Glucose-Capillary: 71 mg/dL (ref 70–99)

## 2021-08-14 LAB — RESP PANEL BY RT-PCR (FLU A&B, COVID) ARPGX2
Influenza A by PCR: NEGATIVE
Influenza B by PCR: NEGATIVE
SARS Coronavirus 2 by RT PCR: NEGATIVE

## 2021-08-14 LAB — TROPONIN I (HIGH SENSITIVITY)
Troponin I (High Sensitivity): 14 ng/L (ref <18)
Troponin I (High Sensitivity): 12 ng/L (ref <18)

## 2021-08-14 LAB — APTT
aPTT: 43 seconds — ABNORMAL HIGH (ref 24–36)
aPTT: 58 seconds — ABNORMAL HIGH (ref 24–36)

## 2021-08-14 LAB — TSH: TSH: 0.976 u[IU]/mL (ref 0.350–4.500)

## 2021-08-14 LAB — HEPARIN LEVEL (UNFRACTIONATED): Heparin Unfractionated: 0.36 IU/mL (ref 0.30–0.70)

## 2021-08-14 LAB — BRAIN NATRIURETIC PEPTIDE: B Natriuretic Peptide: 9.4 pg/mL (ref 0.0–100.0)

## 2021-08-14 MED ORDER — INSULIN ASPART 100 UNIT/ML IJ SOLN: 0.0000 [IU] | Freq: Three times a day (TID) | INTRAMUSCULAR | Status: DC

## 2021-08-14 MED ORDER — BICTEGRAVIR-EMTRICITAB-TENOFOV 50-200-25 MG PO TABS
1.0000 | ORAL_TABLET | Freq: Every day | ORAL | Status: DC
  Administered 2021-08-14 – 2021-08-15 (×2): 1 via ORAL
  Filled 2021-08-14 (×2): qty 1

## 2021-08-14 MED ORDER — SODIUM CHLORIDE 0.9 % IV SOLN: 250.0000 mL | INTRAVENOUS | Status: DC | PRN

## 2021-08-14 MED ORDER — SODIUM CHLORIDE 0.9 % IV SOLN: INTRAVENOUS | Status: DC

## 2021-08-14 MED ORDER — SODIUM CHLORIDE 0.9% FLUSH: 3.0000 mL | INTRAVENOUS | Status: DC | PRN

## 2021-08-14 MED ORDER — ACETAMINOPHEN 650 MG RE SUPP: 650.0000 mg | Freq: Four times a day (QID) | RECTAL | Status: DC | PRN

## 2021-08-14 MED ORDER — SODIUM ZIRCONIUM CYCLOSILICATE 10 G PO PACK
10.0000 g | PACK | Freq: Once | ORAL | Status: AC
  Administered 2021-08-14: 10 g via ORAL
  Filled 2021-08-14: qty 1

## 2021-08-14 MED ORDER — HEPARIN BOLUS VIA INFUSION
3000.0000 [IU] | Freq: Once | INTRAVENOUS | Status: AC
  Administered 2021-08-14: 3000 [IU] via INTRAVENOUS
  Filled 2021-08-14: qty 3000

## 2021-08-14 MED ORDER — ASPIRIN 81 MG PO CHEW
81.0000 mg | CHEWABLE_TABLET | ORAL | Status: AC
  Administered 2021-08-15: 81 mg via ORAL
  Filled 2021-08-14: qty 1

## 2021-08-14 MED ORDER — TECHNETIUM TO 99M ALBUMIN AGGREGATED
4.4000 | Freq: Once | INTRAVENOUS | Status: AC | PRN
  Administered 2021-08-14: 4.4 via INTRAVENOUS

## 2021-08-14 MED ORDER — IOHEXOL 350 MG/ML SOLN
40.0000 mL | Freq: Once | INTRAVENOUS | Status: AC | PRN
  Administered 2021-08-14: 40 mL via INTRAVENOUS

## 2021-08-14 MED ORDER — MORPHINE SULFATE (PF) 2 MG/ML IV SOLN: 2.0000 mg | INTRAVENOUS | Status: DC | PRN

## 2021-08-14 MED ORDER — SODIUM CHLORIDE 0.9 % IV BOLUS
1000.0000 mL | Freq: Once | INTRAVENOUS | Status: AC
  Administered 2021-08-14: 1000 mL via INTRAVENOUS

## 2021-08-14 MED ORDER — ACETAMINOPHEN 325 MG PO TABS: 650.0000 mg | ORAL_TABLET | Freq: Four times a day (QID) | ORAL | Status: DC | PRN

## 2021-08-14 MED ORDER — HEPARIN BOLUS VIA INFUSION
4000.0000 [IU] | Freq: Once | INTRAVENOUS | Status: AC
  Administered 2021-08-14: 4000 [IU] via INTRAVENOUS
  Filled 2021-08-14: qty 4000

## 2021-08-14 MED ORDER — HYDROXYZINE HCL 25 MG PO TABS: 25.0000 mg | ORAL_TABLET | Freq: Every evening | ORAL | Status: DC | PRN

## 2021-08-14 MED ORDER — ONDANSETRON HCL 4 MG PO TABS: 4.0000 mg | ORAL_TABLET | Freq: Four times a day (QID) | ORAL | Status: DC | PRN

## 2021-08-14 MED ORDER — SODIUM CHLORIDE 0.9% FLUSH: 3.0000 mL | Freq: Two times a day (BID) | INTRAVENOUS | Status: DC

## 2021-08-14 MED ORDER — SODIUM CHLORIDE 0.9 % WEIGHT BASED INFUSION
1.0000 mL/kg/h | INTRAVENOUS | Status: DC
  Administered 2021-08-14 – 2021-08-15 (×2): 1 mL/kg/h via INTRAVENOUS

## 2021-08-14 MED ORDER — HEPARIN (PORCINE) 25000 UT/250ML-% IV SOLN
2250.0000 [IU]/h | INTRAVENOUS | Status: DC
  Administered 2021-08-14: 2050 [IU]/h via INTRAVENOUS
  Administered 2021-08-14: 1800 [IU]/h via INTRAVENOUS
  Administered 2021-08-15: 2250 [IU]/h via INTRAVENOUS
  Filled 2021-08-14 (×3): qty 250

## 2021-08-14 MED ORDER — SERTRALINE HCL 50 MG PO TABS
50.0000 mg | ORAL_TABLET | Freq: Every day | ORAL | Status: DC
  Administered 2021-08-14 – 2021-08-15 (×2): 50 mg via ORAL
  Filled 2021-08-14 (×2): qty 1

## 2021-08-14 MED ORDER — ATORVASTATIN CALCIUM 40 MG PO TABS
40.0000 mg | ORAL_TABLET | Freq: Every day | ORAL | Status: DC
  Administered 2021-08-14 – 2021-08-15 (×2): 40 mg via ORAL
  Filled 2021-08-14 (×2): qty 1

## 2021-08-14 MED ORDER — ONDANSETRON HCL 4 MG/2ML IJ SOLN: 4.0000 mg | Freq: Four times a day (QID) | INTRAMUSCULAR | Status: DC | PRN

## 2021-08-14 MED ORDER — COLCHICINE 0.6 MG PO TABS
0.6000 mg | ORAL_TABLET | Freq: Every day | ORAL | Status: DC
  Administered 2021-08-14 – 2021-08-15 (×2): 0.6 mg via ORAL
  Filled 2021-08-14 (×2): qty 1
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
## 2021-08-14 NOTE — Progress Notes (Signed)
ANTICOAGULATION CONSULT NOTE - Initial Consult  Pharmacy Consult for heparin Indication: pulmonary embolus  Allergies  Allergen Reactions   Penicillins Hives and Rash    Has patient had a PCN reaction causing immediate rash, facial/tongue/throat swelling, SOB or lightheadedness with hypotension:YES Has patient had a PCN reaction causing Has patient had a PCN reaction that required hospitalization NO Has patient had a PCN reaction occurring within the last 10 years: YES If all of the above answers are "NO", then may proceed with Cephalosporin use.   Shellfish Allergy Shortness Of Breath   Latex Rash   Metformin Nausea Only    Both IR and XR formulations    Patient Measurements: Height: 6' (182.9 cm) Weight: 105.3 kg (232 lb 3.2 oz) IBW/kg (Calculated) : 77.6 Heparin Dosing Weight: 99.5 kg  Vital Signs: Temp: 98 F (36.7 C) (08/22 1321) Temp Source: Oral (08/22 1321) BP: 158/82 (08/22 1321) Pulse Rate: 122 (08/22 1321)  Labs: Recent Labs    08/13/21 2300 08/14/21 0142 08/14/21 0500 08/14/21 1200  HGB 12.9*  --   --   --   HCT 40.3  --   --   --   PLT 413*  --   --   --   APTT  --   --   --  43*  HEPARINUNFRC  --   --   --  0.36  CREATININE 2.32*  --  1.75*  --   TROPONINIHS 14 12  --   --     Estimated Creatinine Clearance: 57.7 mL/min (A) (by C-G formula based on SCr of 1.75 mg/dL (H)).  Assessment: 60 yo man with h/o PE to start heparin drip for r/o new PE.  He was on xarelto PTA.  Last dose 8/20.  Hg 12.9, PTLC 413.  CT was suboptimal for dx PE.   aPTT subtherapeutic at 43 seconds (HL 0.36, expected given DOAC use)  Goal of Therapy:  aPTT 66-102 seconds Heparin level 0.3-0.7 units/ml Monitor platelets by anticoagulation protocol: Yes   Plan: re-bolus heparin 3000 unit bolus (~30 units/kg) and increase drip to 2050 units/hr (inc by ~2.5u/kg/hr) Check aPTT and heparin level in 6 hours. Daily HL and aPTT until correlating. Daily CBC. Monitor for bleeding  complications  Loleta Dicker, PharmD, [facility] Emergency Medicine Clinical Pharmacist ED RPh Phone: [phone] Main RX: [phone]

## 2021-08-14 NOTE — ED Notes (Addendum)
NUC med ready for pt, SWOT not available at this time for transport.
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
## 2021-08-14 NOTE — Progress Notes (Signed)
  Echocardiogram 2D Echocardiogram has been performed.  Roosvelt Maser F 08/14/2021, 11:01 AM

## 2021-08-14 NOTE — ED Notes (Signed)
Pt ambulated to the restroom.
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
## 2021-08-14 NOTE — Progress Notes (Signed)
Pt seen and examined, admitted earlier this am by Dr.Gardner 59/M with history of HIV, type 2 diabetes mellitus, history of PE in 2013 on anticoagulation with Xarelto, hypertension presented to the ED with chest pain, dizziness, tachycardia, heart rate in the 130s, and suspected syncopal event in the past 1 to 2 weeks. -In the ED he was noted to be tachycardic, labs were notable for acute kidney injury, creatinine of 2.3, he had a CTA chest which was nondiagnostic, TRH was consulted  Chest pain Dyspnea on exertion Recent syncope History of edema -High-sensitivity troponin is negative, EKG with Q waves in inferior leads -Patient reports compliance with Xarelto except for missing a dose on Saturday, clinical suspicion for acute PE is low in the setting of anticoagulation, CTA was nondiagnostic on account of low contrast use for AKI, follow-up VQ scan and Dopplers -Check 2D echocardiogram -Will request cardiology evaluation, may need ischemic evaluation if above work-up was unremarkable  Acute kidney injury -Creatinine 2.3 on admission, improving, down to 1.75 today -Discontinue IV fluids, hold lisinopril -Follow-up echo  History of PE -Xarelto was switched to heparin on admission, VQ scan pending  History of HIV -Continue HAART   Type 2 diabetes mellitus -Hold oral hypoglycemics, sliding scale insulin  Hypertension -Holding lisinopril  Zannie Cove, MD

## 2021-08-14 NOTE — Progress Notes (Signed)
ANTICOAGULATION CONSULT NOTE   Pharmacy Consult for heparin Indication: pulmonary embolus  Allergies  Allergen Reactions   Penicillins Hives and Rash    Has patient had a PCN reaction causing immediate rash, facial/tongue/throat swelling, SOB or lightheadedness with hypotension:YES Has patient had a PCN reaction causing Has patient had a PCN reaction that required hospitalization NO Has patient had a PCN reaction occurring within the last 10 years: YES If all of the above answers are "NO", then may proceed with Cephalosporin use.   Shellfish Allergy Shortness Of Breath   Latex Rash   Metformin Nausea Only    Both IR and XR formulations    Patient Measurements: Height: 6' (182.9 cm) Weight: 105.3 kg (232 lb 3.2 oz) IBW/kg (Calculated) : 77.6 Heparin Dosing Weight: 99.5 kg  Vital Signs: Temp: 99.1 F (37.3 C) (08/22 1953) Temp Source: Oral (08/22 1953) BP: 128/67 (08/22 1953) Pulse Rate: 121 (08/22 1953)  Labs: Recent Labs    08/13/21 2300 08/14/21 0142 08/14/21 0500 08/14/21 1200 08/14/21 2122  HGB 12.9*  --   --   --   --   HCT 40.3  --   --   --   --   PLT 413*  --   --   --   --   APTT  --   --   --  43* 58*  HEPARINUNFRC  --   --   --  0.36  --   CREATININE 2.32*  --  1.75*  --   --   TROPONINIHS 14 12  --   --   --     Estimated Creatinine Clearance: 57.7 mL/min (A) (by C-G formula based on SCr of 1.75 mg/dL (H)).  Assessment: 60 yo man with h/o PE to start heparin drip for r/o new PE.  He was on xarelto PTA.  Last dose 8/20.  Hg 12.9, PTLC 413.  CT was suboptimal for dx PE.   aPTT subtherapeutic at 58 seconds (HL 0.36, expected given DOAC use). No bleeding issues noted.  Goal of Therapy:  aPTT 66-102 seconds Heparin level 0.3-0.7 units/ml Monitor platelets by anticoagulation protocol: Yes   Plan:  Increase heparin to 2250 units/hr Daily HL and aPTT until correlating. Daily CBC. Monitor for bleeding complications  Sheppard Coil PharmD.,  BCPS Clinical Pharmacist 08/14/2021 10:26 PM

## 2021-08-15 ENCOUNTER — Encounter (HOSPITAL_COMMUNITY)
Admission: EM | Disposition: A | Discharge status: Home / Self Care | Payer: Self-pay | Source: Home / Self Care | Attending: Internal Medicine

## 2021-08-15 DIAGNOSIS — R079 Chest pain, unspecified: Secondary | ICD-10-CM

## 2021-08-15 DIAGNOSIS — N179 Acute kidney failure, unspecified: Secondary | ICD-10-CM | POA: Diagnosis not present

## 2021-08-15 HISTORY — PX: RIGHT/LEFT HEART CATH AND CORONARY ANGIOGRAPHY: CATH118266

## 2021-08-15 LAB — POCT I-STAT 7, (LYTES, BLD GAS, ICA,H+H)
Acid-base deficit: 5 mmol/L — ABNORMAL HIGH (ref 0.0–2.0)
Bicarbonate: 20.7 mmol/L (ref 20.0–28.0)
Calcium, Ion: 1.16 mmol/L (ref 1.15–1.40)
HCT: 34 % — ABNORMAL LOW (ref 39.0–52.0)
Hemoglobin: 11.6 g/dL — ABNORMAL LOW (ref 13.0–17.0)
O2 Saturation: 99 %
Potassium: 4 mmol/L (ref 3.5–5.1)
Sodium: 141 mmol/L (ref 135–145)
TCO2: 22 mmol/L (ref 22–32)
pCO2 arterial: 37.5 mmHg (ref 32.0–48.0)
pH, Arterial: 7.349 — ABNORMAL LOW (ref 7.350–7.450)
pO2, Arterial: 128 mmHg — ABNORMAL HIGH (ref 83.0–108.0)

## 2021-08-15 LAB — CBC
HCT: 34.9 % — ABNORMAL LOW (ref 39.0–52.0)
Hemoglobin: 11.2 g/dL — ABNORMAL LOW (ref 13.0–17.0)
MCH: 29.4 pg (ref 26.0–34.0)
MCHC: 32.1 g/dL (ref 30.0–36.0)
MCV: 91.6 fL (ref 80.0–100.0)
Platelets: 343 10*3/uL (ref 150–400)
RBC: 3.81 MIL/uL — ABNORMAL LOW (ref 4.22–5.81)
RDW: 15.4 % (ref 11.5–15.5)
WBC: 6.8 10*3/uL (ref 4.0–10.5)
nRBC: 0 % (ref 0.0–0.2)

## 2021-08-15 LAB — POCT I-STAT EG7
Acid-base deficit: 4 mmol/L — ABNORMAL HIGH (ref 0.0–2.0)
Acid-base deficit: 4 mmol/L — ABNORMAL HIGH (ref 0.0–2.0)
Bicarbonate: 21.7 mmol/L (ref 20.0–28.0)
Bicarbonate: 21.8 mmol/L (ref 20.0–28.0)
Calcium, Ion: 1.21 mmol/L (ref 1.15–1.40)
Calcium, Ion: 1.21 mmol/L (ref 1.15–1.40)
HCT: 34 % — ABNORMAL LOW (ref 39.0–52.0)
HCT: 35 % — ABNORMAL LOW (ref 39.0–52.0)
Hemoglobin: 11.6 g/dL — ABNORMAL LOW (ref 13.0–17.0)
Hemoglobin: 11.9 g/dL — ABNORMAL LOW (ref 13.0–17.0)
O2 Saturation: 73 %
O2 Saturation: 74 %
Potassium: 4.2 mmol/L (ref 3.5–5.1)
Potassium: 4.2 mmol/L (ref 3.5–5.1)
Sodium: 141 mmol/L (ref 135–145)
Sodium: 141 mmol/L (ref 135–145)
TCO2: 23 mmol/L (ref 22–32)
TCO2: 23 mmol/L (ref 22–32)
pCO2, Ven: 41.4 mmHg — ABNORMAL LOW (ref 44.0–60.0)
pCO2, Ven: 41.6 mmHg — ABNORMAL LOW (ref 44.0–60.0)
pH, Ven: 7.324 (ref 7.250–7.430)
pH, Ven: 7.328 (ref 7.250–7.430)
pO2, Ven: 42 mmHg (ref 32.0–45.0)
pO2, Ven: 43 mmHg (ref 32.0–45.0)

## 2021-08-15 LAB — BASIC METABOLIC PANEL
Anion gap: 8 (ref 5–15)
BUN: 12 mg/dL (ref 6–20)
CO2: 18 mmol/L — ABNORMAL LOW (ref 22–32)
Calcium: 8.2 mg/dL — ABNORMAL LOW (ref 8.9–10.3)
Chloride: 108 mmol/L (ref 98–111)
Creatinine, Ser: 1.37 mg/dL — ABNORMAL HIGH (ref 0.61–1.24)
GFR, Estimated: 60 mL/min — ABNORMAL LOW (ref >60)
Glucose, Bld: 84 mg/dL (ref 70–99)
Potassium: 4.4 mmol/L (ref 3.5–5.1)
Sodium: 134 mmol/L — ABNORMAL LOW (ref 135–145)

## 2021-08-15 LAB — HEPARIN LEVEL (UNFRACTIONATED): Heparin Unfractionated: 0.29 IU/mL — ABNORMAL LOW (ref 0.30–0.70)

## 2021-08-15 LAB — GLUCOSE, CAPILLARY
Glucose-Capillary: 88 mg/dL (ref 70–99)
Glucose-Capillary: 79 mg/dL (ref 70–99)

## 2021-08-15 LAB — APTT: aPTT: 47 seconds — ABNORMAL HIGH (ref 24–36)

## 2021-08-15 SURGERY — RIGHT/LEFT HEART CATH AND CORONARY ANGIOGRAPHY
Anesthesia: LOCAL

## 2021-08-15 MED ORDER — TRIAMCINOLONE ACETONIDE 0.5 % EX CREA
TOPICAL_CREAM | Freq: Two times a day (BID) | CUTANEOUS | Status: DC
  Filled 2021-08-15: qty 15

## 2021-08-15 MED ORDER — FENTANYL CITRATE (PF) 100 MCG/2ML IJ SOLN
INTRAMUSCULAR | Status: DC | PRN
  Administered 2021-08-15: 25 ug via INTRAVENOUS

## 2021-08-15 MED ORDER — FENTANYL CITRATE (PF) 100 MCG/2ML IJ SOLN
INTRAMUSCULAR | Status: AC
  Filled 2021-08-15: qty 2

## 2021-08-15 MED ORDER — SODIUM CHLORIDE 0.9 % IV SOLN
INTRAVENOUS | Status: AC | PRN
  Administered 2021-08-15: 105.3 mL/h via INTRAVENOUS

## 2021-08-15 MED ORDER — METOPROLOL SUCCINATE ER 25 MG PO TB24
25.0000 mg | ORAL_TABLET | Freq: Every day | ORAL | Status: DC
  Administered 2021-08-15: 25 mg via ORAL
  Filled 2021-08-15: qty 1

## 2021-08-15 MED ORDER — RIVAROXABAN 20 MG PO TABS: 20.0000 mg | ORAL_TABLET | Freq: Every day | ORAL | Status: DC

## 2021-08-15 MED ORDER — SODIUM CHLORIDE 0.9% FLUSH: 3.0000 mL | Freq: Two times a day (BID) | INTRAVENOUS | Status: DC

## 2021-08-15 MED ORDER — MIDAZOLAM HCL 2 MG/2ML IJ SOLN
INTRAMUSCULAR | Status: DC | PRN
  Administered 2021-08-15: 2 mg via INTRAVENOUS

## 2021-08-15 MED ORDER — HEPARIN (PORCINE) IN NACL 1000-0.9 UT/500ML-% IV SOLN
INTRAVENOUS | Status: AC
  Filled 2021-08-15: qty 500

## 2021-08-15 MED ORDER — VERAPAMIL HCL 2.5 MG/ML IV SOLN
INTRAVENOUS | Status: AC
  Filled 2021-08-15: qty 2

## 2021-08-15 MED ORDER — VERAPAMIL HCL 2.5 MG/ML IV SOLN
INTRAVENOUS | Status: DC | PRN
  Administered 2021-08-15: 10 mL via INTRA_ARTERIAL

## 2021-08-15 MED ORDER — METOPROLOL SUCCINATE ER 25 MG PO TB24: 25.0000 mg | ORAL_TABLET | Freq: Every day | ORAL | 0 refills | Status: DC

## 2021-08-15 MED ORDER — HEPARIN (PORCINE) IN NACL 1000-0.9 UT/500ML-% IV SOLN
INTRAVENOUS | Status: DC | PRN
  Administered 2021-08-15 (×2): 500 mL

## 2021-08-15 MED ORDER — LIDOCAINE HCL (PF) 1 % IJ SOLN
INTRAMUSCULAR | Status: AC
  Filled 2021-08-15: qty 30

## 2021-08-15 MED ORDER — SODIUM CHLORIDE 0.9 % IV SOLN: 250.0000 mL | INTRAVENOUS | Status: DC | PRN

## 2021-08-15 MED ORDER — MIDAZOLAM HCL 2 MG/2ML IJ SOLN
INTRAMUSCULAR | Status: AC
  Filled 2021-08-15: qty 2

## 2021-08-15 MED ORDER — IOHEXOL 350 MG/ML SOLN
INTRAVENOUS | Status: DC | PRN
  Administered 2021-08-15: 70 mL

## 2021-08-15 MED ORDER — HEPARIN SODIUM (PORCINE) 1000 UNIT/ML IJ SOLN
INTRAMUSCULAR | Status: DC | PRN
  Administered 2021-08-15: 5000 [IU] via INTRAVENOUS

## 2021-08-15 MED ORDER — HYDROCERIN EX CREA
TOPICAL_CREAM | Freq: Two times a day (BID) | CUTANEOUS | Status: DC
  Filled 2021-08-15: qty 113

## 2021-08-15 MED ORDER — LIDOCAINE HCL (PF) 1 % IJ SOLN
INTRAMUSCULAR | Status: DC | PRN
  Administered 2021-08-15 (×2): 2 mL

## 2021-08-15 MED ORDER — SODIUM CHLORIDE 0.9 % WEIGHT BASED INFUSION: 1.0000 mL/kg/h | INTRAVENOUS | Status: DC

## 2021-08-15 MED ORDER — SODIUM CHLORIDE 0.9% FLUSH: 3.0000 mL | INTRAVENOUS | Status: DC | PRN

## 2021-08-15 SURGICAL SUPPLY — 12 items
CATH 5FR JL3.5 JR4 ANG PIG MP (CATHETERS) ×1 IMPLANT
CATH BALLN WEDGE 5F 110CM (CATHETERS) ×1 IMPLANT
DEVICE RAD COMP TR BAND LRG (VASCULAR PRODUCTS) ×1 IMPLANT
GLIDESHEATH SLEND SS 6F .021 (SHEATH) ×1 IMPLANT
GUIDEWIRE INQWIRE 1.5J.035X260 (WIRE) IMPLANT
INQWIRE 1.5J .035X260CM (WIRE) ×2
KIT HEART LEFT (KITS) ×2 IMPLANT
PACK CARDIAC CATHETERIZATION (CUSTOM PROCEDURE TRAY) ×2 IMPLANT
SHEATH GLIDE SLENDER 4/5FR (SHEATH) ×1 IMPLANT
SYR MEDRAD MARK 7 150ML (SYRINGE) ×2 IMPLANT
TRANSDUCER W/STOPCOCK (MISCELLANEOUS) ×2 IMPLANT
TUBING CIL FLEX 10 FLL-RA (TUBING) ×2 IMPLANT

## 2021-08-15 NOTE — Discharge Summary (Signed)
Physician Discharge Summary  Craig RoysBruce [name] AVW:[id number] DOB: [birth year] DOA: 08/13/2021  PCP: Dwana MelenaScott, Russell B, PA  Admit date: 08/13/2021 Discharge date: 08/15/2021  Time spent:35 minutes  Recommendations for Outpatient Follow-up:  Cardiology Dr. Izora Ribashandrasekhar in 2 months   Discharge Diagnoses:  Principal Problem:   AKI (acute kidney injury) (HCC) Chest pain Tachycardia   Long term current use of anticoagulant therapy   HIV infection (HCC)   Chronic diastolic heart failure (HCC)   Essential hypertension   History of pulmonary embolus (PE)   Type 2 diabetes mellitus without complication, with long-term current use of insulin (HCC)  Discharge Condition: stable  Diet recommendation: carb modfied  Filed Weights   08/13/21 2253 08/14/21 1320 08/15/21 0330  Weight: 108.4 kg 105.3 kg 103.9 kg    History of present illness:  59/M with history of HIV, type 2 diabetes mellitus, history of PE in 2013 on anticoagulation with Xarelto, hypertension presented to the ED with chest pain, dizziness, tachycardia, heart rate in the 130s, and suspected syncopal event in the past 1 to 2 weeks. -In the ED he was noted to be tachycardic, labs were notable for acute kidney injury, creatinine of 2.3, he had a CTA chest which was nondiagnostic.  Hospital Course:   Chest pain Dyspnea on exertion Recent syncope -High-sensitivity troponin is negative, EKG with Q waves in inferior leads -Patient reports compliance with Xarelto except for missing a dose on Saturday, clinical suspicion for acute PE is low in the setting of anticoagulation, CTA was nondiagnostic on account of low contrast use for AKI, VQ scan negative for PE, Dopplers negative as well -2D echo noted preserved EF -Seen by cardiology in consultation, underwent left heart cath Today which showed minimal nonobstructive CAD with normal right heart pressures, discharged home in a stable condition, follow-up with cardiology arranged   Acute  kidney injury -Creatinine 2.3 on admission, improving, creatinine down to 1.3 today -Discontinued IV fluids and lisinopril   History of PE -Xarelto resumed post cath   History of HIV -Continue HAART    Type 2 diabetes mellitus -Stable, resumed oral hypoglycemics   Hypertension -Discontinued lisinopril on account of severe acute kidney injury, started on low-dose Toprol   Severe eczema -Add Eucerin and triamcinolone cream -Advised follow-up with dermatology  Discharge Exam: Vitals:   08/15/21 1200 08/15/21 1205  BP: (!) 165/77 (!) 165/77  Pulse: (!) 119 (!) 119  Resp:  16  Temp:  98.6 F (37 C)  SpO2: 100%    General exam: Appears calm and comfortable  Respiratory system: Clear to auscultation Cardiovascular system: S1 & S2 heard, RRR.  Abd: nondistended, soft and nontender.Normal bowel sounds heard. Central nervous system: Alert and oriented. No focal neurological deficits. Extremities: Sno edema Skin: Severe eczema with dry thick scaly skin involving neck chest arms legs Psychiatry: Mood & affect appropriate.     Discharge Instructions   Discharge Instructions     Diet - low sodium heart healthy   Complete by: As directed    Diet Carb Modified   Complete by: As directed    Increase activity slowly   Complete by: As directed       Allergies as of 08/15/2021       Reactions   Penicillins Hives, Rash   Has patient had a PCN reaction causing immediate rash, facial/tongue/throat swelling, SOB or lightheadedness with hypotension:YES Has patient had a PCN reaction causing Has patient had a PCN reaction that required hospitalization NO Has patient had a PCN  reaction occurring within the last 10 years: YES If all of the above answers are "NO", then may proceed with Cephalosporin use.   Shellfish Allergy Shortness Of Breath   Latex Rash   Metformin Nausea Only   Both IR and XR formulations        Medication List     STOP taking these medications     lisinopril 5 MG tablet Commonly known as: ZESTRIL       TAKE these medications    acetaminophen 500 MG tablet Commonly known as: TYLENOL Take 1,000 mg by mouth every 6 (six) hours as needed for moderate pain or headache.   atorvastatin 40 MG tablet Commonly known as: LIPITOR Take 40 mg by mouth daily.   Biktarvy 50-200-25 MG Tabs tablet Generic drug: bictegravir-emtricitabine-tenofovir AF Take 1 tablet by mouth daily.   colchicine 0.6 MG tablet Take 1 tablet (0.6 mg total) by mouth daily.   Dupixent 300 MG/2ML prefilled syringe Generic drug: dupilumab Inject 300 mg into the skin every 14 (fourteen) days.   empagliflozin 25 MG Tabs tablet Commonly known as: JARDIANCE Take 25 mg by mouth daily.   hydrOXYzine 25 MG tablet Commonly known as: ATARAX/VISTARIL Take 1 tablet (25 mg total) by mouth at bedtime as needed. What changed: reasons to take this   metoprolol succinate 25 MG 24 hr tablet Commonly known as: TOPROL-XL Take 1 tablet (25 mg total) by mouth daily. Start taking on: August 16, 2021   rivaroxaban 20 MG Tabs tablet Commonly known as: XARELTO Take 20 mg by mouth daily with supper.   Rybelsus 14 MG Tabs Generic drug: Semaglutide Take 14 mg by mouth daily.   sertraline 50 MG tablet Commonly known as: ZOLOFT Take 50 mg by mouth daily.   tiZANidine 4 MG tablet Commonly known as: Zanaflex Take 1 tablet (4 mg total) by mouth every 6 (six) hours as needed for muscle spasms.   triamcinolone ointment 0.1 % Commonly known as: KENALOG Apply 1 application topically 2 (two) times daily as needed (itching).       Allergies  Allergen Reactions   Penicillins Hives and Rash    Has patient had a PCN reaction causing immediate rash, facial/tongue/throat swelling, SOB or lightheadedness with hypotension:YES Has patient had a PCN reaction causing Has patient had a PCN reaction that required hospitalization NO Has patient had a PCN reaction occurring within  the last 10 years: YES If all of the above answers are "NO", then may proceed with Cephalosporin use.   Shellfish Allergy Shortness Of Breath   Latex Rash   Metformin Nausea Only    Both IR and XR formulations    Follow-up Information     Christell Constant, MD Follow up on 11/06/2021.   Specialty: Cardiology Why: 11:40 Contact information: [address] [place] [address] [phone]         Dwana Melena, [place]. Schedule an appointment as soon as possible for a visit in 1 week(s).   Specialty: Internal Medicine Contact information: [address] [facility] [id number] [facility] [place] [address] [phone]                  The results of significant diagnostics from this hospitalization (including imaging, microbiology, ancillary and laboratory) are listed below for reference.    Significant Diagnostic Studies: CT Angio Chest Pulmonary Embolism (PE) W or WO Contrast  Result Date: 08/14/2021 CLINICAL DATA:  Pulmonary embolus suspected with high probability. Patient has  evidence of acute renal failure with severely decreased GFR of 32, down from GFR greater than 61 month ago. EXAM: CT ANGIOGRAPHY CHEST WITH CONTRAST TECHNIQUE: Multidetector CT imaging of the chest was performed using the standard protocol during bolus administration of intravenous contrast. Multiplanar CT image reconstructions and MIPs were obtained to evaluate the vascular anatomy. CONTRAST:  40 cc Isovue 370 COMPARISON:  06/24/2021 FINDINGS: Cardiovascular: A decreased contrast dose was used due to the patient's acute renal insufficiency. Despite adequate opacification of the central pulmonary arteries on the triggering scan, the opacification of the pulmonary arteries on the final examination is suboptimal resulting in nondiagnostic examination for pulmonary embolus. On the triggering scanned, there is no evidence of large saddle embolus. The RV to LV ratio is normal  suggesting no evidence of right heart strain. Given the finding of acute renal compromise, repeat bolusing of the patient is not recommended. Depending on the level of clinical suspicion, further evaluation radionuclide perfusion study could be obtained or if there was very high clinical suspicion, the patient could be empirically anticoagulated until the renal issues are resolved. Normal caliber thoracic aorta. No aortic dissection. Great vessel origins are patent. Normal heart size. No pericardial effusions. Mediastinum/Nodes: Esophagus is decompressed. Small esophageal hiatal hernia. Thyroid gland is unremarkable. Mediastinal lymph nodes are not enlarged. Moderately prominent axillary lymph nodes, possibly reactive or inflammatory. Lungs/Pleura: Focal scarring or atelectasis in the medial aspect of the left lower lung. Similar changes are present on the prior study suggesting chronic process. Otherwise, no focal consolidation or infiltration suggested. No pleural effusions. No pneumothorax. Upper Abdomen: No acute abnormalities demonstrated in the visualized upper abdomen. Musculoskeletal: Degenerative changes in the spine. No destructive bone lesions. Review of the MIP images confirms the above findings. IMPRESSION: 1. Indeterminate examination of the pulmonary arteries due to poor contrast bolus. Additional contrast is not recommended due to the patient's renal insufficiency. Depending on the level of clinical suspicion, consider radionuclide perfusion study or empiric anticoagulation until the renal issues are resolved. 2. Scarring or atelectasis in the medial left lung base, unchanged since prior study. Electronically Signed   By: Burman Nieves M.D.   On: 08/14/2021 01:34   CARDIAC CATHETERIZATION  Result Date: 08/15/2021   Prox LAD lesion is 10% stenosed.   LV end diastolic pressure is normal. No significant CAD Normal LV filling pressures. PCWP Mean 6 mm Hg Normal right heart pressures PAP mean 21 mm  Hg Normal cardiac output. Index 3.36.   US RENAL  Result Date: 08/14/2021 CLINICAL DATA:  Acute kidney injury EXAM: RENAL / URINARY TRACT ULTRASOUND COMPLETE COMPARISON:  Abdominal CT 06/24/2021 FINDINGS: Right Kidney: Renal measurements: 10.9 x 4.3 x 6.1 cm = volume: 150 mL. Echogenicity within normal limits. No mass or hydronephrosis visualized. Left Kidney: Renal measurements: 12.7 x 6 x 8 cm = volume: 320 mL. Asymmetric volume is related to a simple renal cyst measuring up to 7.6 cm. Echogenicity within normal limits. No solid mass or hydronephrosis visualized. Bladder: Appears normal for degree of bladder distention. IMPRESSION: 1. No explanation for renal failure. 2. Large but simple left renal cyst. Electronically Signed   By: Marnee Spring M.D.   On: 08/14/2021 04:47   NM Pulmonary Perf and Vent  Result Date: 08/14/2021 CLINICAL DATA:  Concern for pulmonary embolism. Short of breath. Prior DVT and PE. Chest pain. EXAM: NUCLEAR MEDICINE PERFUSION LUNG SCAN TECHNIQUE: Perfusion images were obtained in multiple projections after intravenous injection of radiopharmaceutical. Ventilation scans intentionally deferred if perfusion  scan and chest x-ray adequate for interpretation during COVID 19 epidemic. RADIOPHARMACEUTICALS:  4.4 mCi Tc-40m MAA IV COMPARISON:  08/14/2021 FINDINGS: No wedge-shaped peripheral perfusion defect within the LEFT or RIGHT lung to suggest acute pulmonary embolism. Elevation of the LEFT hemidiaphragm noted. IMPRESSION: No evidence acute pulmonary embolism. Electronically Signed   By: Genevive Bi M.D.   On: 08/14/2021 16:04   DG Chest Portable 1 View  Result Date: 08/13/2021 CLINICAL DATA:  Chest pain, tachycardia EXAM: PORTABLE CHEST 1 VIEW COMPARISON:  06/24/2021 FINDINGS: The heart size and mediastinal contours are within normal limits. Both lungs are clear. The visualized skeletal structures are unremarkable. IMPRESSION: No active disease. Electronically Signed   By:  Helyn Numbers M.D.   On: 08/13/2021 23:25   ECHOCARDIOGRAM COMPLETE  Result Date: 08/14/2021    ECHOCARDIOGRAM REPORT   Patient Name:   Craig [name] Date of Exam: 08/14/2021 Medical Rec #:  [id number]     Height:       72.0 in Accession #:    [id number]    Weight:       239.0 lb Date of Birth:  1961/12/09    BSA:          2.298 m Patient Age:    59 years      BP:           144/81 mmHg Patient Gender: M             HR:           116 bpm. Exam Location:  Inpatient Procedure: 2D Echo, Cardiac Doppler and Color Doppler Indications:    Chest Pain  History:        Patient has no prior history of Echocardiogram examinations.  Sonographer:    Roosvelt Maser RDCS Referring Phys: [phone] JARED M GARDNER IMPRESSIONS  1. Left ventricular ejection fraction, by estimation, is 65 to 70%. The left ventricle has normal function. The left ventricle has no regional wall motion abnormalities. There is moderate left ventricular hypertrophy. Left ventricular diastolic parameters are indeterminate.  2. Right ventricular systolic function is normal. The right ventricular size is normal. There is normal pulmonary artery systolic pressure. The estimated right ventricular systolic pressure is 32.6 mmHg.  3. The mitral valve is normal in structure. No evidence of mitral valve regurgitation.  4. The aortic valve was not well visualized. Aortic valve regurgitation is not visualized. No aortic stenosis is present.  5. The inferior vena cava is normal in size with greater than 50% respiratory variability, suggesting right atrial pressure of 3 mmHg. FINDINGS  Left Ventricle: Left ventricular ejection fraction, by estimation, is 65 to 70%. The left ventricle has normal function. The left ventricle has no regional wall motion abnormalities. The left ventricular internal cavity size was normal in size. There is  moderate left ventricular hypertrophy. Left ventricular diastolic parameters are indeterminate. Right Ventricle: The right ventricular size is  normal. No increase in right ventricular wall thickness. Right ventricular systolic function is normal. There is normal pulmonary artery systolic pressure. The tricuspid regurgitant velocity is 2.72 m/s, and  with an assumed right atrial pressure of 3 mmHg, the estimated right ventricular systolic pressure is 32.6 mmHg. Left Atrium: Left atrial size was normal in size. Right Atrium: Right atrial size was normal in size. Pericardium: Trivial pericardial effusion is present. Mitral Valve: The mitral valve is normal in structure. No evidence of mitral valve regurgitation. Tricuspid Valve: The tricuspid valve is normal in structure. Tricuspid valve regurgitation is  trivial. Aortic Valve: The aortic valve was not well visualized. Aortic valve regurgitation is not visualized. No aortic stenosis is present. Aortic valve mean gradient measures 5.0 mmHg. Aortic valve peak gradient measures 8.2 mmHg. Aortic valve area, by VTI measures 3.55 cm. Pulmonic Valve: The pulmonic valve was grossly normal. Pulmonic valve regurgitation is not visualized. Aorta: The aortic root and ascending aorta are structurally normal, with no evidence of dilitation. Venous: The inferior vena cava is normal in size with greater than 50% respiratory variability, suggesting right atrial pressure of 3 mmHg. IAS/Shunts: The interatrial septum was not well visualized.  LEFT VENTRICLE PLAX 2D LVIDd:         4.30 cm LVIDs:         2.50 cm LV PW:         1.30 cm LV IVS:        1.30 cm LVOT diam:     2.30 cm LV SV:         73 LV SV Index:   32 LVOT Area:     4.15 cm  RIGHT VENTRICLE RV Basal diam:  3.40 cm RV S prime:     20.50 cm/s TAPSE (M-mode): 2.9 cm LEFT ATRIUM             Index       RIGHT ATRIUM           Index LA diam:        3.90 cm 1.70 cm/m  RA Area:     17.20 cm LA Vol (A2C):   55.5 ml 24.15 ml/m RA Volume:   43.40 ml  18.88 ml/m LA Vol (A4C):   59.6 ml 25.93 ml/m LA Biplane Vol: 58.0 ml 25.24 ml/m  AORTIC VALVE AV Area (Vmax):    3.81  cm AV Area (Vmean):   3.49 cm AV Area (VTI):     3.55 cm AV Vmax:           143.00 cm/s AV Vmean:          99.100 cm/s AV VTI:            0.206 m AV Peak Grad:      8.2 mmHg AV Mean Grad:      5.0 mmHg LVOT Vmax:         131.00 cm/s LVOT Vmean:        83.200 cm/s LVOT VTI:          0.176 m LVOT/AV VTI ratio: 0.85  AORTA Ao Root diam: 3.60 cm Ao Asc diam:  3.20 cm TRICUSPID VALVE TR Peak grad:   29.6 mmHg TR Vmax:        272.00 cm/s  SHUNTS Systemic VTI:  0.18 m Systemic Diam: 2.30 cm Epifanio Lesches MD Electronically signed by Epifanio Lesches MD Signature Date/Time: 08/14/2021/3:30:12 PM    Final    VAS [place] LOWER EXTREMITY VENOUS (DVT)  Result Date: 08/14/2021  Lower Venous DVT Study Patient Name:  Craig [name]  Date of Exam:   08/14/2021 Medical Rec #: [id number]      Accession #:    [id number] Date of Birth: Apr 16, 1961     Patient Gender: M Patient Age:   19 years Exam [facility]  [facility] Procedure:      VAS [place] LOWER EXTREMITY VENOUS (DVT) Referring Phys: Lyda Perone --------------------------------------------------------------------------------  Indications: Edema, and Pain.  Risk Factors: remote history of DVT in left lower extremity HIV. Anticoagulation: Xarelto. Comparison       Prior  negative bilateral lower extremity venous duplex done Study:           05/2017 Performing Technologist: Sherren Kerns RVS  Examination Guidelines: A complete evaluation includes B-mode imaging, spectral Doppler, color Doppler, and power Doppler as needed of all accessible portions of each vessel. Bilateral testing is considered an integral part of a complete examination. Limited examinations for reoccurring indications may be performed as noted. The reflux portion of the exam is performed with the patient in reverse Trendelenburg.  +---------+---------------+---------+-----------+----------+--------------+ RIGHT    CompressibilityPhasicitySpontaneityPropertiesThrombus Aging  +---------+---------------+---------+-----------+----------+--------------+ CFV      Full           Yes      Yes                                 +---------+---------------+---------+-----------+----------+--------------+ SFJ      Full                                                        +---------+---------------+---------+-----------+----------+--------------+ FV Prox  Full                                                        +---------+---------------+---------+-----------+----------+--------------+ FV Mid   Full                                                        +---------+---------------+---------+-----------+----------+--------------+ FV DistalFull                                                        +---------+---------------+---------+-----------+----------+--------------+ PFV      Full                                                        +---------+---------------+---------+-----------+----------+--------------+ POP      Full           Yes      Yes                                 +---------+---------------+---------+-----------+----------+--------------+ PTV      Full                                                        +---------+---------------+---------+-----------+----------+--------------+ PERO     Full                                                        +---------+---------------+---------+-----------+----------+--------------+   +---------+---------------+---------+-----------+----------+--------------+  LEFT     CompressibilityPhasicitySpontaneityPropertiesThrombus Aging +---------+---------------+---------+-----------+----------+--------------+ CFV      Full           Yes      Yes                                 +---------+---------------+---------+-----------+----------+--------------+ SFJ      Full                                                         +---------+---------------+---------+-----------+----------+--------------+ FV Prox  Full                                                        +---------+---------------+---------+-----------+----------+--------------+ FV Mid   Partial                                      Chronic        +---------+---------------+---------+-----------+----------+--------------+ FV DistalPartial                                      Chronic        +---------+---------------+---------+-----------+----------+--------------+ PFV      Full                                                        +---------+---------------+---------+-----------+----------+--------------+ POP      Full           Yes      Yes                                 +---------+---------------+---------+-----------+----------+--------------+ PTV      Full                                                        +---------+---------------+---------+-----------+----------+--------------+ PERO     Full                                                        +---------+---------------+---------+-----------+----------+--------------+    Summary: BILATERAL: -No evidence of popliteal cyst, bilaterally. RIGHT: - There is no evidence of deep vein thrombosis in the lower extremity.  - Ultrasound characteristics of enlarged lymph nodes are noted in the groin.  LEFT: - Findings consistent with chronic deep vein thrombosis involving the left femoral vein.  *See table(s) above for measurements and observations.    Preliminary  Microbiology: Recent Results (from the past 240 hour(s))  Resp Panel by RT-PCR (Flu A&B, Covid) Nasopharyngeal Swab     Status: None   Collection Time: 08/13/21 11:26 PM   Specimen: Nasopharyngeal Swab; Nasopharyngeal(NP) swabs in vial transport medium  Result Value Ref Range Status   SARS Coronavirus 2 by RT PCR NEGATIVE NEGATIVE Final    Comment: (NOTE) SARS-CoV-2 target nucleic acids are NOT  DETECTED.  The SARS-CoV-2 RNA is generally detectable in upper respiratory specimens during the acute phase of infection. The lowest concentration of SARS-CoV-2 viral copies this assay can detect is 138 copies/mL. A negative result does not preclude SARS-Cov-2 infection and should not be used as the sole basis for treatment or other patient management decisions. A negative result may occur with  improper specimen collection/handling, submission of specimen other than nasopharyngeal swab, presence of viral mutation(s) within the areas targeted by this assay, and inadequate number of viral copies(<138 copies/mL). A negative result must be combined with clinical observations, patient history, and epidemiological information. The expected result is Negative.  Fact Sheet for Patients:  BloggerCourse.com  Fact Sheet for Healthcare Providers:  SeriousBroker.it  This test is no t yet approved or cleared by the [place] FDA and  has been authorized for detection and/or diagnosis of SARS-CoV-2 by FDA under an Emergency Use Authorization (EUA). This EUA will remain  in effect (meaning this test can be used) for the duration of the COVID-19 declaration under Section 564(b)(1) of the Act, 21 U.S.C.section 360bbb-3(b)(1), unless the authorization is terminated  or revoked sooner.       Influenza A by PCR NEGATIVE NEGATIVE Final   Influenza B by PCR NEGATIVE NEGATIVE Final    Comment: (NOTE) The Xpert Xpress SARS-CoV-2/FLU/RSV plus assay is intended as an aid in the diagnosis of influenza from Nasopharyngeal swab specimens and should not be used as a sole basis for treatment. Nasal washings and aspirates are unacceptable for Xpert Xpress SARS-CoV-2/FLU/RSV testing.  Fact Sheet for Patients: BloggerCourse.com  Fact Sheet for Healthcare Providers: SeriousBroker.it  This test is not yet  approved or cleared by the [place] FDA and has been authorized for detection and/or diagnosis of SARS-CoV-2 by FDA under an Emergency Use Authorization (EUA). This EUA will remain in effect (meaning this test can be used) for the duration of the COVID-19 declaration under Section 564(b)(1) of the Act, 21 U.S.C. section 360bbb-3(b)(1), unless the authorization is terminated or revoked.  Performed at [facility], [address] [place], [place] [address]      Labs: Basic Metabolic Panel: Recent Labs  Lab 08/13/21 2300 08/14/21 0500 08/15/21 0610  NA 133* 133* 134*  K 4.8 5.4* 4.4  CL 102 107 108  CO2 21* 18* 18*  GLUCOSE 95 95 84  BUN CREATININE 2.32* 1.75* 1.37*  CALCIUM 9.1 8.2* 8.2*   Liver Function Tests: Recent Labs  Lab 08/13/21 2300  AST 36  ALT 22  ALKPHOS 88  BILITOT 0.8  PROT 8.7*  ALBUMIN 3.3*   No results for input(s): LIPASE, AMYLASE in the last 168 hours. No results for input(s): AMMONIA in the last 168 hours. CBC: Recent Labs  Lab 08/13/21 2300 08/15/21 0610  WBC 8.5 6.8  HGB 12.9* 11.2*  HCT 40.3 34.9*  MCV 91.0 91.6  PLT 413* 343   Cardiac Enzymes: No results for input(s): CKTOTAL, CKMB, CKMBINDEX, TROPONINI in the last 168 hours. BNP: BNP (last 3 results) Recent Labs  08/13/21 2300  BNP 9.4    ProBNP (last 3 results) No results for input(s): PROBNP in the last 8760 hours.  CBG: Recent Labs  Lab 08/14/21 1245 08/14/21 1646 08/14/21 2200 08/15/21 0600 08/15/21 1109  GLUCAP 71 89 93 88 79       Signed:  Zannie Cove MD.  [facility] 08/15/2021, 1:32 PM

## 2021-08-15 NOTE — H&P (View-Only) (Signed)
 Progress Note  Patient Name: Craig [name] Date of Encounter: 08/15/2021  Primary Cardiologist: Amiera Herzberg A Narada Uzzle, MD   Subjective   Patient notes worsening itching overnight.  Notes that he needs to shower and won't get his heart catheterization until he showers.  No chest pain, SOB, near syncope or syncope.  Inpatient Medications    Scheduled Meds:  atorvastatin  40 mg Oral Daily   bictegravir-emtricitabine-tenofovir AF  1 tablet Oral Daily   colchicine  0.6 mg Oral Daily   hydrocerin   Topical BID   insulin aspart  0-15 Units Subcutaneous TID WC   sertraline  50 mg Oral Daily   sodium chloride flush  3 mL Intravenous Q12H   triamcinolone cream   Topical BID   Continuous Infusions:  sodium chloride     sodium chloride 1 mL/kg/hr (08/15/21 0609)   PRN Meds: sodium chloride, acetaminophen **OR** acetaminophen, hydrOXYzine, morphine injection, ondansetron **OR** ondansetron (ZOFRAN) IV, sodium chloride flush   Vital Signs    Vitals:   08/15/21 0012 08/15/21 0330 08/15/21 0349 08/15/21 0734  BP: (!) 144/73  (!) 145/78 124/61  Pulse: (!) 124  (!) 126 (!) 122  Resp: 18  18 20  Temp: 99.5 F (37.5 C)  97.7 F (36.5 C) 98.9 F (37.2 C)  TempSrc: Oral  Oral Oral  SpO2: 99%  100% 100%  Weight:  103.9 kg    Height:        Intake/Output Summary (Last 24 hours) at 08/15/2021 0847 Last data filed at 08/15/2021 0754 Gross per 24 hour  Intake 1880.34 ml  Output 930 ml  Net 950.34 ml   Filed Weights   08/13/21 2253 08/14/21 1320 08/15/21 0330  Weight: 108.4 kg 105.3 kg 103.9 kg    Telemetry    Sinus Tachycardia - Personally Reviewed  ECG    No new - Personally Reviewed  Physical Exam   GEN: No acute distress.   Neck: No JVD Cardiac: RRR, no murmurs, rubs, or gallops.  Respiratory: Clear to auscultation bilaterally.  Able to lay flat without issue GI: Soft, nontender, non-distended  Integument: Eczematous skin changes UE and LE No deformity. Neuro:   Nonfocal  Psych: Normal affect   Labs    Chemistry Recent Labs  Lab 08/13/21 2300 08/14/21 0500 08/15/21 0610  NA 133* 133* 134*  K 4.8 5.4* 4.4  CL 102 107 108  CO2 21* 18* 18*  GLUCOSE 95 95 84  BUN 19 16 12  CREATININE 2.32* 1.75* 1.37*  CALCIUM 9.1 8.2* 8.2*  PROT 8.7*  --   --   ALBUMIN 3.3*  --   --   AST 36  --   --   ALT 22  --   --   ALKPHOS 88  --   --   BILITOT 0.8  --   --   GFRNONAA 32* 45* 60*  ANIONGAP 10 8 8     Hematology Recent Labs  Lab 08/13/21 2300 08/15/21 0610  WBC 8.5 6.8  RBC 4.43 3.81*  HGB 12.9* 11.2*  HCT 40.3 34.9*  MCV 91.0 91.6  MCH 29.1 29.4  MCHC 32.0 32.1  RDW 15.1 15.4  PLT 413* 343    Cardiac EnzymesNo results for input(s): TROPONINI in the last 168 hours. No results for input(s): TROPIPOC in the last 168 hours.   BNP Recent Labs  Lab 08/13/21 2300  BNP 9.4     DDimer No results for input(s): DDIMER in the last 168 hours.     Radiology    CT Angio Chest Pulmonary Embolism (PE) W or WO Contrast  Result Date: 08/14/2021 CLINICAL DATA:  Pulmonary embolus suspected with high probability. Patient has evidence of acute renal failure with severely decreased GFR of 32, down from GFR greater than 61 month ago. EXAM: CT ANGIOGRAPHY CHEST WITH CONTRAST TECHNIQUE: Multidetector CT imaging of the chest was performed using the standard protocol during bolus administration of intravenous contrast. Multiplanar CT image reconstructions and MIPs were obtained to evaluate the vascular anatomy. CONTRAST:  40 cc Isovue 370 COMPARISON:  06/24/2021 FINDINGS: Cardiovascular: A decreased contrast dose was used due to the patient's acute renal insufficiency. Despite adequate opacification of the central pulmonary arteries on the triggering scan, the opacification of the pulmonary arteries on the final examination is suboptimal resulting in nondiagnostic examination for pulmonary embolus. On the triggering scanned, there is no evidence of large  saddle embolus. The RV to LV ratio is normal suggesting no evidence of right heart strain. Given the finding of acute renal compromise, repeat bolusing of the patient is not recommended. Depending on the level of clinical suspicion, further evaluation radionuclide perfusion study could be obtained or if there was very high clinical suspicion, the patient could be empirically anticoagulated until the renal issues are resolved. Normal caliber thoracic aorta. No aortic dissection. Great vessel origins are patent. Normal heart size. No pericardial effusions. Mediastinum/Nodes: Esophagus is decompressed. Small esophageal hiatal hernia. Thyroid gland is unremarkable. Mediastinal lymph nodes are not enlarged. Moderately prominent axillary lymph nodes, possibly reactive or inflammatory. Lungs/Pleura: Focal scarring or atelectasis in the medial aspect of the left lower lung. Similar changes are present on the prior study suggesting chronic process. Otherwise, no focal consolidation or infiltration suggested. No pleural effusions. No pneumothorax. Upper Abdomen: No acute abnormalities demonstrated in the visualized upper abdomen. Musculoskeletal: Degenerative changes in the spine. No destructive bone lesions. Review of the MIP images confirms the above findings. IMPRESSION: 1. Indeterminate examination of the pulmonary arteries due to poor contrast bolus. Additional contrast is not recommended due to the patient's renal insufficiency. Depending on the level of clinical suspicion, consider radionuclide perfusion study or empiric anticoagulation until the renal issues are resolved. 2. Scarring or atelectasis in the medial left lung base, unchanged since prior study. Electronically Signed   By: Burman Nieves M.D.   On: 08/14/2021 01:34   US RENAL  Result Date: 08/14/2021 CLINICAL DATA:  Acute kidney injury EXAM: RENAL / URINARY TRACT ULTRASOUND COMPLETE COMPARISON:  Abdominal CT 06/24/2021 FINDINGS: Right Kidney: Renal  measurements: 10.9 x 4.3 x 6.1 cm = volume: 150 mL. Echogenicity within normal limits. No mass or hydronephrosis visualized. Left Kidney: Renal measurements: 12.7 x 6 x 8 cm = volume: 320 mL. Asymmetric volume is related to a simple renal cyst measuring up to 7.6 cm. Echogenicity within normal limits. No solid mass or hydronephrosis visualized. Bladder: Appears normal for degree of bladder distention. IMPRESSION: 1. No explanation for renal failure. 2. Large but simple left renal cyst. Electronically Signed   By: Marnee Spring M.D.   On: 08/14/2021 04:47   NM Pulmonary Perf and Vent  Result Date: 08/14/2021 CLINICAL DATA:  Concern for pulmonary embolism. Short of breath. Prior DVT and PE. Chest pain. EXAM: NUCLEAR MEDICINE PERFUSION LUNG SCAN TECHNIQUE: Perfusion images were obtained in multiple projections after intravenous injection of radiopharmaceutical. Ventilation scans intentionally deferred if perfusion scan and chest x-ray adequate for interpretation during COVID 19 epidemic. RADIOPHARMACEUTICALS:  4.4 mCi Tc-56m MAA IV COMPARISON:  08/14/2021  FINDINGS: No wedge-shaped peripheral perfusion defect within the LEFT or RIGHT lung to suggest acute pulmonary embolism. Elevation of the LEFT hemidiaphragm noted. IMPRESSION: No evidence acute pulmonary embolism. Electronically Signed   By: Genevive BiStewart  Edmunds M.D.   On: 08/14/2021 16:04   DG Chest Portable 1 View  Result Date: 08/13/2021 CLINICAL DATA:  Chest pain, tachycardia EXAM: PORTABLE CHEST 1 VIEW COMPARISON:  06/24/2021 FINDINGS: The heart size and mediastinal contours are within normal limits. Both lungs are clear. The visualized skeletal structures are unremarkable. IMPRESSION: No active disease. Electronically Signed   By: Helyn NumbersAshesh  Parikh M.D.   On: 08/13/2021 23:25   ECHOCARDIOGRAM COMPLETE  Result Date: 08/14/2021    ECHOCARDIOGRAM REPORT   Patient Name:   Craig RoysBRUCE Tietze Date of Exam: 08/14/2021 Medical Rec #:  [id number]     Height:       72.0  in Accession #:    [id number]    Weight:       239.0 lb Date of Birth:  21-Oct-1961    BSA:          2.298 m Patient Age:    59 years      BP:           144/81 mmHg Patient Gender: M             HR:           116 bpm. Exam Location:  Inpatient Procedure: 2D Echo, Cardiac Doppler and Color Doppler Indications:    Chest Pain  History:        Patient has no prior history of Echocardiogram examinations.  Sonographer:    Roosvelt Maserachel Lane RDCS Referring Phys: [phone] JARED M GARDNER IMPRESSIONS  1. Left ventricular ejection fraction, by estimation, is 65 to 70%. The left ventricle has normal function. The left ventricle has no regional wall motion abnormalities. There is moderate left ventricular hypertrophy. Left ventricular diastolic parameters are indeterminate.  2. Right ventricular systolic function is normal. The right ventricular size is normal. There is normal pulmonary artery systolic pressure. The estimated right ventricular systolic pressure is 32.6 mmHg.  3. The mitral valve is normal in structure. No evidence of mitral valve regurgitation.  4. The aortic valve was not well visualized. Aortic valve regurgitation is not visualized. No aortic stenosis is present.  5. The inferior vena cava is normal in size with greater than 50% respiratory variability, suggesting right atrial pressure of 3 mmHg. FINDINGS  Left Ventricle: Left ventricular ejection fraction, by estimation, is 65 to 70%. The left ventricle has normal function. The left ventricle has no regional wall motion abnormalities. The left ventricular internal cavity size was normal in size. There is  moderate left ventricular hypertrophy. Left ventricular diastolic parameters are indeterminate. Right Ventricle: The right ventricular size is normal. No increase in right ventricular wall thickness. Right ventricular systolic function is normal. There is normal pulmonary artery systolic pressure. The tricuspid regurgitant velocity is 2.72 m/s, and  with an assumed  right atrial pressure of 3 mmHg, the estimated right ventricular systolic pressure is 32.6 mmHg. Left Atrium: Left atrial size was normal in size. Right Atrium: Right atrial size was normal in size. Pericardium: Trivial pericardial effusion is present. Mitral Valve: The mitral valve is normal in structure. No evidence of mitral valve regurgitation. Tricuspid Valve: The tricuspid valve is normal in structure. Tricuspid valve regurgitation is trivial. Aortic Valve: The aortic valve was not well visualized. Aortic valve regurgitation is not visualized. No aortic stenosis is present.  Aortic valve mean gradient measures 5.0 mmHg. Aortic valve peak gradient measures 8.2 mmHg. Aortic valve area, by VTI measures 3.55 cm. Pulmonic Valve: The pulmonic valve was grossly normal. Pulmonic valve regurgitation is not visualized. Aorta: The aortic root and ascending aorta are structurally normal, with no evidence of dilitation. Venous: The inferior vena cava is normal in size with greater than 50% respiratory variability, suggesting right atrial pressure of 3 mmHg. IAS/Shunts: The interatrial septum was not well visualized.  LEFT VENTRICLE PLAX 2D LVIDd:         4.30 cm LVIDs:         2.50 cm LV PW:         1.30 cm LV IVS:        1.30 cm LVOT diam:     2.30 cm LV SV:         73 LV SV Index:   32 LVOT Area:     4.15 cm  RIGHT VENTRICLE RV Basal diam:  3.40 cm RV S prime:     20.50 cm/s TAPSE (M-mode): 2.9 cm LEFT ATRIUM             Index       RIGHT ATRIUM           Index LA diam:        3.90 cm 1.70 cm/m  RA Area:     17.20 cm LA Vol (A2C):   55.5 ml 24.15 ml/m RA Volume:   43.40 ml  18.88 ml/m LA Vol (A4C):   59.6 ml 25.93 ml/m LA Biplane Vol: 58.0 ml 25.24 ml/m  AORTIC VALVE AV Area (Vmax):    3.81 cm AV Area (Vmean):   3.49 cm AV Area (VTI):     3.55 cm AV Vmax:           143.00 cm/s AV Vmean:          99.100 cm/s AV VTI:            0.206 m AV Peak Grad:      8.2 mmHg AV Mean Grad:      5.0 mmHg LVOT Vmax:          131.00 cm/s LVOT Vmean:        83.200 cm/s LVOT VTI:          0.176 m LVOT/AV VTI ratio: 0.85  AORTA Ao Root diam: 3.60 cm Ao Asc diam:  3.20 cm TRICUSPID VALVE TR Peak grad:   29.6 mmHg TR Vmax:        272.00 cm/s  SHUNTS Systemic VTI:  0.18 m Systemic Diam: 2.30 cm Epifanio Lesches MD Electronically signed by Epifanio Lesches MD Signature Date/Time: 08/14/2021/3:30:12 PM    Final    VAS [place] LOWER EXTREMITY VENOUS (DVT)  Result Date: 08/14/2021  Lower Venous DVT Study Patient Name:  Craig [name]  Date of Exam:   08/14/2021 Medical Rec #: [id number]      Accession #:    [id number] Date of Birth: 10-30-1961     Patient Gender: M Patient Age:   25 years Exam Location:  [facility] Procedure:      VAS [place] LOWER EXTREMITY VENOUS (DVT) Referring Phys: Lyda Perone --------------------------------------------------------------------------------  Indications: Edema, and Pain.  Risk Factors: remote history of DVT in left lower extremity HIV. Anticoagulation: Xarelto. Comparison       Prior negative bilateral lower extremity venous duplex done Study:           05/2017 Performing Technologist:  Sharion Dove RVS  Examination Guidelines: A complete evaluation includes B-mode imaging, spectral Doppler, color Doppler, and power Doppler as needed of all accessible portions of each vessel. Bilateral testing is considered an integral part of a complete examination. Limited examinations for reoccurring indications may be performed as noted. The reflux portion of the exam is performed with the patient in reverse Trendelenburg.  +---------+---------------+---------+-----------+----------+--------------+ RIGHT    CompressibilityPhasicitySpontaneityPropertiesThrombus Aging +---------+---------------+---------+-----------+----------+--------------+ CFV      Full           Yes      Yes                                 +---------+---------------+---------+-----------+----------+--------------+ SFJ      Full                                                         +---------+---------------+---------+-----------+----------+--------------+ FV Prox  Full                                                        +---------+---------------+---------+-----------+----------+--------------+ FV Mid   Full                                                        +---------+---------------+---------+-----------+----------+--------------+ FV DistalFull                                                        +---------+---------------+---------+-----------+----------+--------------+ PFV      Full                                                        +---------+---------------+---------+-----------+----------+--------------+ POP      Full           Yes      Yes                                 +---------+---------------+---------+-----------+----------+--------------+ PTV      Full                                                        +---------+---------------+---------+-----------+----------+--------------+ PERO     Full                                                        +---------+---------------+---------+-----------+----------+--------------+   +---------+---------------+---------+-----------+----------+--------------+  LEFT     CompressibilityPhasicitySpontaneityPropertiesThrombus Aging +---------+---------------+---------+-----------+----------+--------------+ CFV      Full           Yes      Yes                                 +---------+---------------+---------+-----------+----------+--------------+ SFJ      Full                                                        +---------+---------------+---------+-----------+----------+--------------+ FV Prox  Full                                                        +---------+---------------+---------+-----------+----------+--------------+ FV Mid   Partial                                       Chronic        +---------+---------------+---------+-----------+----------+--------------+ FV DistalPartial                                      Chronic        +---------+---------------+---------+-----------+----------+--------------+ PFV      Full                                                        +---------+---------------+---------+-----------+----------+--------------+ POP      Full           Yes      Yes                                 +---------+---------------+---------+-----------+----------+--------------+ PTV      Full                                                        +---------+---------------+---------+-----------+----------+--------------+ PERO     Full                                                        +---------+---------------+---------+-----------+----------+--------------+    Summary: BILATERAL: -No evidence of popliteal cyst, bilaterally. RIGHT: - There is no evidence of deep vein thrombosis in the lower extremity.  - Ultrasound characteristics of enlarged lymph nodes are noted in the groin.  LEFT: - Findings consistent with chronic deep vein thrombosis involving the left femoral vein.  *See table(s) above for measurements and observations.    Preliminary  Patient Profile     60 y.o. male with a history of unprovoked DVT & PE in 2013 lats dose of DOAC on Saturday, HIV controlled on Biktarvy, HTN with DM, Eczema,  who complained of chest pain.  Assessment & Plan    Chest pain with syncope PND and Orthopnea Sinus tachycardia HTN with DM HIV controlled on Biktarvy HLD Eczema -Chest pain is now resolved - given that patient has tachycardia that would prevent CCTA but notable LAD calcium with CAD risk factors as listed above - described orthopnea and PND but with normal BNP - given sub-optimal NM Scanner at [facility], we had elected for heart catheterization - had long conversation with patient for which we recommended that he do  this procedure even though they are calling from him while he wants to go take a shower - we have discuss that while symptoms have resolve, he has also had two ED evals this month for these concerns - if patient defers heart catheterization and is asymptomatic, we will plan for outpatient assessment with D-SPECT NM Stress (Lexiscan) and outpatient follow up  - if patient defers LHC we wil transition back to Xarelto  - lipids pending this AM - continue lipitor 40 mg   Hx of PE in 2013 Chronic anticoagulation - heparin presently with level mgmt; pending xarelto either post cath or if cath is deferred.      AKI - sCr 1.32 (continuing to improve with fluid   Time Spent Directly with Patient:   I have spent a total of 35 minutes with the patient reviewing notes, imaging, EKGs, labs and examining the patient as well as establishing an assessment and plan that was discussed personally with the patient.  > 50% of time was spent in direct patient care discussing heart catheterization and re-reviewing his symptoms with him.  Patient is not sure if he can wait to shower for his heart catheterization.     For questions or updates, please contact [facility] Please consult [email or web address] for contact info under Cardiology/STEMI.      Signed, Christell Constant, MD  08/15/2021, 8:47 AM

## 2021-08-15 NOTE — Progress Notes (Signed)
PA?O x4, is requesting having a shower, as he did not have it since admission. IV, which was placed by IV team is out and hanging off the patient. Second IV pt is scratching his hand and says to take it off or he will do it. RN at the bedside explained the importance of IV being in place during the procedure. Pt is getting very agitated, states that nothing can happen because he has his faith in god.  IV was removed. MD at the bedside explained the importance of the procedure, as well as consequences. Pt is addiment to have a shower.  Heart Cath called and was told no IV present. They state they will deal with this in the room at the card cath. Pt removed tele monitor at this time to take the shower

## 2021-08-15 NOTE — Progress Notes (Signed)
Pt had a shower as requested. He applied a cream all ofver the body despite the education that was provided. Pt transferred to cath lab with no distresses (Demanded his 2 rings be on)

## 2021-08-15 NOTE — Interval H&P Note (Signed)
History and Physical Interval Note:  08/15/2021 9:23 AM  Craig [name]  has presented today for surgery, with the diagnosis of unstable angina.  The various methods of treatment have been discussed with the patient and family. After consideration of risks, benefits and other options for treatment, the patient has consented to  Procedure(s): RIGHT/LEFT HEART CATH AND CORONARY ANGIOGRAPHY (N/A) as a surgical intervention.  The patient's history has been reviewed, patient examined, no change in status, stable for surgery.  I have reviewed the patient's chart and labs.  Questions were answered to the patient's satisfaction.   Cath Lab Visit (complete for each Cath Lab visit)  Clinical Evaluation Leading to the Procedure:   ACS: Yes.    Non-ACS:    Anginal Classification: CCS III  Anti-ischemic medical therapy: No Therapy  Non-Invasive Test Results: No non-invasive testing performed  Prior CABG: No previous CABG       Theron Arista [facility] 08/15/2021 9:24 AM

## 2021-08-15 NOTE — Progress Notes (Signed)
Patient A/Ox4, RA, VS stable. AVS documents and education provided. All questions were answered. IV removed and intact, pt belongings with the patient, including clothes, rings, charges, and phone.  Sister is picking up the patient. Pt transferred off the floor

## 2021-08-15 NOTE — Progress Notes (Signed)
PROGRESS NOTE    Craig [name]  ZOX:[id number] DOB: [birth year] DOA: 08/13/2021 PCP: Dwana Melena, PA  Brief Narrative:60/M with history of HIV, type 2 diabetes mellitus, history of PE in 2013 on anticoagulation with Xarelto, hypertension presented to the ED with chest pain, dizziness, tachycardia, heart rate in the 130s, and suspected syncopal event in the past 1 to 2 weeks. -In the ED he was noted to be tachycardic, labs were notable for acute kidney injury, creatinine of 2.3, he had a CTA chest which was nondiagnostic.   Assessment & Plan:   Chest pain Dyspnea on exertion Recent syncope -High-sensitivity troponin is negative, EKG with Q waves in inferior leads -Patient reports compliance with Xarelto except for missing a dose on Saturday, clinical suspicion for acute PE is low in the setting of anticoagulation, CTA was nondiagnostic on account of low contrast use for AKI, VQ scan negative for PE, Dopplers negative as well -2D echo noted preserved EF -Appreciate cardiology input, plan for left heart cath today -Resume Xarelto after heart cath   Acute kidney injury -Creatinine 2.3 on admission, improving, creatinine down to 1.3 today -Discontinue IV fluids, hold lisinopril   History of PE -Xarelto was switched to heparin on admission, now discontinued, resume Xarelto pending cath   History of HIV -Continue HAART    Type 2 diabetes mellitus -Hold oral hypoglycemics, sliding scale insulin   Hypertension -Holding lisinopril  Severe eczema -Add Eucerin and triamcinolone cream -Advised follow-up with dermatology  DVT prophylaxis: IV heparin held Code Status: Full code Family Communication: Discussed patient in detail, no family at bedside Disposition Plan:  Status is: Inpatient  Remains inpatient appropriate because:Inpatient level of care appropriate due to severity of illness  Dispo: The patient is from: Home              Anticipated d/c is to: Home               Patient currently is not medically stable to d/c.   Difficult to place patient No   Consultants:  Cardiology  Procedures:   Antimicrobials:    Subjective: -Had some chest chest discomfort last night, none this morning, denies any dyspnea today, extremely bothered by his dry scaly itchy skin  Objective: Vitals:   08/15/21 0330 08/15/21 0349 08/15/21 0734 08/15/21 0946  BP:  (!) 145/78 124/61   Pulse:  (!) 126 (!) 122   Resp:  18 20   Temp:  97.7 F (36.5 C) 98.9 F (37.2 C)   TempSrc:  Oral Oral   SpO2:  100% 100% 100%  Weight: 103.9 kg     Height:        Intake/Output Summary (Last 24 hours) at 08/15/2021 1033 Last data filed at 08/15/2021 0754 Gross per 24 hour  Intake 1880.34 ml  Output 930 ml  Net 950.34 ml   Filed Weights   08/13/21 2253 08/14/21 1320 08/15/21 0330  Weight: 108.4 kg 105.3 kg 103.9 kg    Examination:  General exam: Appears calm and comfortable  Respiratory system: Clear to auscultation Cardiovascular system: S1 & S2 heard, RRR.  Abd: nondistended, soft and nontender.Normal bowel sounds heard. Central nervous system: Alert and oriented. No focal neurological deficits. Extremities: Sno edema Skin: Severe eczema with dry thick scaly skin involving neck chest arms legs Psychiatry: Mood & affect appropriate.     Data Reviewed:   CBC: Recent Labs  Lab 08/13/21 2300 08/15/21 0610  WBC 8.5 6.8  HGB 12.9* 11.2*  HCT 40.3  34.9*  MCV 91.0 91.6  PLT 413* 343   Basic Metabolic Panel: Recent Labs  Lab 08/13/21 2300 08/14/21 0500 08/15/21 0610  NA 133* 133* 134*  K 4.8 5.4* 4.4  CL 102 107 108  CO2 21* 18* 18*  GLUCOSE 95 95 84  BUN 19 16 12   CREATININE 2.32* 1.75* 1.37*  CALCIUM 9.1 8.2* 8.2*   GFR: Estimated Creatinine Clearance: 73.2 mL/min (A) (by C-G formula based on SCr of 1.37 mg/dL (H)). Liver Function Tests: Recent Labs  Lab 08/13/21 2300  AST 36  ALT 22  ALKPHOS 88  BILITOT 0.8  PROT 8.7*  ALBUMIN 3.3*   No  results for input(s): LIPASE, AMYLASE in the last 168 hours. No results for input(s): AMMONIA in the last 168 hours. Coagulation Profile: No results for input(s): INR, PROTIME in the last 168 hours. Cardiac Enzymes: No results for input(s): CKTOTAL, CKMB, CKMBINDEX, TROPONINI in the last 168 hours. BNP (last 3 results) No results for input(s): PROBNP in the last 8760 hours. HbA1C: Recent Labs    08/14/21 0521  HGBA1C 6.2*   CBG: Recent Labs  Lab 08/14/21 0747 08/14/21 1245 08/14/21 1646 08/14/21 2200 08/15/21 0600  GLUCAP 78 71 89 93 88   Lipid Profile: No results for input(s): CHOL, HDL, LDLCALC, TRIG, CHOLHDL, LDLDIRECT in the last 72 hours. Thyroid Function Tests: Recent Labs    08/14/21 0521  TSH 0.976   Anemia Panel: No results for input(s): VITAMINB12, FOLATE, FERRITIN, TIBC, IRON, RETICCTPCT in the last 72 hours. Urine analysis:    Component Value Date/Time   COLORURINE YELLOW 08/14/2021 0425   APPEARANCEUR CLEAR 08/14/2021 0425   LABSPEC 1.027 08/14/2021 0425   PHURINE 5.0 08/14/2021 0425   GLUCOSEU 150 (A) 08/14/2021 0425   HGBUR NEGATIVE 08/14/2021 0425   BILIRUBINUR NEGATIVE 08/14/2021 0425   KETONESUR NEGATIVE 08/14/2021 0425   PROTEINUR 30 (A) 08/14/2021 0425   UROBILINOGEN 1 02/25/2014 1113   NITRITE NEGATIVE 08/14/2021 0425   LEUKOCYTESUR NEGATIVE 08/14/2021 0425   Sepsis Labs: @LABRCNTIP (procalcitonin:4,lacticidven:4)  ) Recent Results (from the past 240 hour(s))  Resp Panel by RT-PCR (Flu A&B, Covid) Nasopharyngeal Swab     Status: None   Collection Time: 08/13/21 11:26 PM   Specimen: Nasopharyngeal Swab; Nasopharyngeal(NP) swabs in vial transport medium  Result Value Ref Range Status   SARS Coronavirus 2 by RT PCR NEGATIVE NEGATIVE Final    Comment: (NOTE) SARS-CoV-2 target nucleic acids are NOT DETECTED.  The SARS-CoV-2 RNA is generally detectable in upper respiratory specimens during the acute phase of infection. The  lowest concentration of SARS-CoV-2 viral copies this assay can detect is 138 copies/mL. A negative result does not preclude SARS-Cov-2 infection and should not be used as the sole basis for treatment or other patient management decisions. A negative result may occur with  improper specimen collection/handling, submission of specimen other than nasopharyngeal swab, presence of viral mutation(s) within the areas targeted by this assay, and inadequate number of viral copies(<138 copies/mL). A negative result must be combined with clinical observations, patient history, and epidemiological information. The expected result is Negative.  Fact Sheet for Patients:  BloggerCourse.comhttps://www.fda.gov/media/152166/download  Fact Sheet for Healthcare Providers:  SeriousBroker.ithttps://www.fda.gov/media/152162/download  This test is no t yet approved or cleared by the [place] FDA and  has been authorized for detection and/or diagnosis of SARS-CoV-2 by FDA under an Emergency Use Authorization (EUA). This EUA will remain  in effect (meaning this test can be used) for the duration of the COVID-19 declaration  under Section 564(b)(1) of the Act, 21 U.S.C.section 360bbb-3(b)(1), unless the authorization is terminated  or revoked sooner.       Influenza A by PCR NEGATIVE NEGATIVE Final   Influenza B by PCR NEGATIVE NEGATIVE Final    Comment: (NOTE) The Xpert Xpress SARS-CoV-2/FLU/RSV plus assay is intended as an aid in the diagnosis of influenza from Nasopharyngeal swab specimens and should not be used as a sole basis for treatment. Nasal washings and aspirates are unacceptable for Xpert Xpress SARS-CoV-2/FLU/RSV testing.  Fact Sheet for Patients: BloggerCourse.com  Fact Sheet for Healthcare Providers: SeriousBroker.it  This test is not yet approved or cleared by the [place] FDA and has been authorized for detection and/or diagnosis of SARS-CoV-2 by FDA under  an Emergency Use Authorization (EUA). This EUA will remain in effect (meaning this test can be used) for the duration of the COVID-19 declaration under Section 564(b)(1) of the Act, 21 U.S.C. section 360bbb-3(b)(1), unless the authorization is terminated or revoked.  Performed at [facility], [address], [place], [place] [address]          Radiology Studies: CT Angio Chest Pulmonary Embolism (PE) W or WO Contrast  Result Date: 08/14/2021 CLINICAL DATA:  Pulmonary embolus suspected with high probability. Patient has evidence of acute renal failure with severely decreased GFR of 32, down from GFR greater than 61 month ago. EXAM: CT ANGIOGRAPHY CHEST WITH CONTRAST TECHNIQUE: Multidetector CT imaging of the chest was performed using the standard protocol during bolus administration of intravenous contrast. Multiplanar CT image reconstructions and MIPs were obtained to evaluate the vascular anatomy. CONTRAST:  40 cc Isovue 370 COMPARISON:  06/24/2021 FINDINGS: Cardiovascular: A decreased contrast dose was used due to the patient's acute renal insufficiency. Despite adequate opacification of the central pulmonary arteries on the triggering scan, the opacification of the pulmonary arteries on the final examination is suboptimal resulting in nondiagnostic examination for pulmonary embolus. On the triggering scanned, there is no evidence of large saddle embolus. The RV to LV ratio is normal suggesting no evidence of right heart strain. Given the finding of acute renal compromise, repeat bolusing of the patient is not recommended. Depending on the level of clinical suspicion, further evaluation radionuclide perfusion study could be obtained or if there was very high clinical suspicion, the patient could be empirically anticoagulated until the renal issues are resolved. Normal caliber thoracic aorta. No aortic dissection. Great vessel origins are patent. Normal heart size. No pericardial effusions.  Mediastinum/Nodes: Esophagus is decompressed. Small esophageal hiatal hernia. Thyroid gland is unremarkable. Mediastinal lymph nodes are not enlarged. Moderately prominent axillary lymph nodes, possibly reactive or inflammatory. Lungs/Pleura: Focal scarring or atelectasis in the medial aspect of the left lower lung. Similar changes are present on the prior study suggesting chronic process. Otherwise, no focal consolidation or infiltration suggested. No pleural effusions. No pneumothorax. Upper Abdomen: No acute abnormalities demonstrated in the visualized upper abdomen. Musculoskeletal: Degenerative changes in the spine. No destructive bone lesions. Review of the MIP images confirms the above findings. IMPRESSION: 1. Indeterminate examination of the pulmonary arteries due to poor contrast bolus. Additional contrast is not recommended due to the patient's renal insufficiency. Depending on the level of clinical suspicion, consider radionuclide perfusion study or empiric anticoagulation until the renal issues are resolved. 2. Scarring or atelectasis in the medial left lung base, unchanged since prior study. Electronically Signed   By: Burman Nieves M.D.   On: 08/14/2021 01:34   US RENAL  Result Date: 08/14/2021  CLINICAL DATA:  Acute kidney injury EXAM: RENAL / URINARY TRACT ULTRASOUND COMPLETE COMPARISON:  Abdominal CT 06/24/2021 FINDINGS: Right Kidney: Renal measurements: 10.9 x 4.3 x 6.1 cm = volume: 150 mL. Echogenicity within normal limits. No mass or hydronephrosis visualized. Left Kidney: Renal measurements: 12.7 x 6 x 8 cm = volume: 320 mL. Asymmetric volume is related to a simple renal cyst measuring up to 7.6 cm. Echogenicity within normal limits. No solid mass or hydronephrosis visualized. Bladder: Appears normal for degree of bladder distention. IMPRESSION: 1. No explanation for renal failure. 2. Large but simple left renal cyst. Electronically Signed   By: Marnee Spring M.D.   On: 08/14/2021 04:47    NM Pulmonary Perf and Vent  Result Date: 08/14/2021 CLINICAL DATA:  Concern for pulmonary embolism. Short of breath. Prior DVT and PE. Chest pain. EXAM: NUCLEAR MEDICINE PERFUSION LUNG SCAN TECHNIQUE: Perfusion images were obtained in multiple projections after intravenous injection of radiopharmaceutical. Ventilation scans intentionally deferred if perfusion scan and chest x-ray adequate for interpretation during COVID 19 epidemic. RADIOPHARMACEUTICALS:  4.4 mCi Tc-52m MAA IV COMPARISON:  08/14/2021 FINDINGS: No wedge-shaped peripheral perfusion defect within the LEFT or RIGHT lung to suggest acute pulmonary embolism. Elevation of the LEFT hemidiaphragm noted. IMPRESSION: No evidence acute pulmonary embolism. Electronically Signed   By: Genevive Bi M.D.   On: 08/14/2021 16:04   DG Chest Portable 1 View  Result Date: 08/13/2021 CLINICAL DATA:  Chest pain, tachycardia EXAM: PORTABLE CHEST 1 VIEW COMPARISON:  06/24/2021 FINDINGS: The heart size and mediastinal contours are within normal limits. Both lungs are clear. The visualized skeletal structures are unremarkable. IMPRESSION: No active disease. Electronically Signed   By: Helyn Numbers M.D.   On: 08/13/2021 23:25   ECHOCARDIOGRAM COMPLETE  Result Date: 08/14/2021    ECHOCARDIOGRAM REPORT   Patient Name:   Craig [name] Date of Exam: 08/14/2021 Medical Rec #:  [id number]     Height:       72.0 in Accession #:    [id number]    Weight:       239.0 lb Date of Birth:  January 02, 1961    BSA:          2.298 m Patient Age:    59 years      BP:           144/81 mmHg Patient Gender: M             HR:           116 bpm. Exam Location:  Inpatient Procedure: 2D Echo, Cardiac Doppler and Color Doppler Indications:    Chest Pain  History:        Patient has no prior history of Echocardiogram examinations.  Sonographer:    Roosvelt Maser RDCS Referring Phys: [phone] JARED M GARDNER IMPRESSIONS  1. Left ventricular ejection fraction, by estimation, is 65 to 70%. The left  ventricle has normal function. The left ventricle has no regional wall motion abnormalities. There is moderate left ventricular hypertrophy. Left ventricular diastolic parameters are indeterminate.  2. Right ventricular systolic function is normal. The right ventricular size is normal. There is normal pulmonary artery systolic pressure. The estimated right ventricular systolic pressure is 32.6 mmHg.  3. The mitral valve is normal in structure. No evidence of mitral valve regurgitation.  4. The aortic valve was not well visualized. Aortic valve regurgitation is not visualized. No aortic stenosis is present.  5. The inferior vena cava is normal in size with greater than 50%  respiratory variability, suggesting right atrial pressure of 3 mmHg. FINDINGS  Left Ventricle: Left ventricular ejection fraction, by estimation, is 65 to 70%. The left ventricle has normal function. The left ventricle has no regional wall motion abnormalities. The left ventricular internal cavity size was normal in size. There is  moderate left ventricular hypertrophy. Left ventricular diastolic parameters are indeterminate. Right Ventricle: The right ventricular size is normal. No increase in right ventricular wall thickness. Right ventricular systolic function is normal. There is normal pulmonary artery systolic pressure. The tricuspid regurgitant velocity is 2.72 m/s, and  with an assumed right atrial pressure of 3 mmHg, the estimated right ventricular systolic pressure is 32.6 mmHg. Left Atrium: Left atrial size was normal in size. Right Atrium: Right atrial size was normal in size. Pericardium: Trivial pericardial effusion is present. Mitral Valve: The mitral valve is normal in structure. No evidence of mitral valve regurgitation. Tricuspid Valve: The tricuspid valve is normal in structure. Tricuspid valve regurgitation is trivial. Aortic Valve: The aortic valve was not well visualized. Aortic valve regurgitation is not visualized. No aortic  stenosis is present. Aortic valve mean gradient measures 5.0 mmHg. Aortic valve peak gradient measures 8.2 mmHg. Aortic valve area, by VTI measures 3.55 cm. Pulmonic Valve: The pulmonic valve was grossly normal. Pulmonic valve regurgitation is not visualized. Aorta: The aortic root and ascending aorta are structurally normal, with no evidence of dilitation. Venous: The inferior vena cava is normal in size with greater than 50% respiratory variability, suggesting right atrial pressure of 3 mmHg. IAS/Shunts: The interatrial septum was not well visualized.  LEFT VENTRICLE PLAX 2D LVIDd:         4.30 cm LVIDs:         2.50 cm LV PW:         1.30 cm LV IVS:        1.30 cm LVOT diam:     2.30 cm LV SV:         73 LV SV Index:   32 LVOT Area:     4.15 cm  RIGHT VENTRICLE RV Basal diam:  3.40 cm RV S prime:     20.50 cm/s TAPSE (M-mode): 2.9 cm LEFT ATRIUM             Index       RIGHT ATRIUM           Index LA diam:        3.90 cm 1.70 cm/m  RA Area:     17.20 cm LA Vol (A2C):   55.5 ml 24.15 ml/m RA Volume:   43.40 ml  18.88 ml/m LA Vol (A4C):   59.6 ml 25.93 ml/m LA Biplane Vol: 58.0 ml 25.24 ml/m  AORTIC VALVE AV Area (Vmax):    3.81 cm AV Area (Vmean):   3.49 cm AV Area (VTI):     3.55 cm AV Vmax:           143.00 cm/s AV Vmean:          99.100 cm/s AV VTI:            0.206 m AV Peak Grad:      8.2 mmHg AV Mean Grad:      5.0 mmHg LVOT Vmax:         131.00 cm/s LVOT Vmean:        83.200 cm/s LVOT VTI:          0.176 m LVOT/AV VTI ratio: 0.85  AORTA Ao Root diam:  3.60 cm Ao Asc diam:  3.20 cm TRICUSPID VALVE TR Peak grad:   29.6 mmHg TR Vmax:        272.00 cm/s  SHUNTS Systemic VTI:  0.18 m Systemic Diam: 2.30 cm Epifanio Lesches MD Electronically signed by Epifanio Lesches MD Signature Date/Time: 08/14/2021/3:30:12 PM    Final    VAS [place] LOWER EXTREMITY VENOUS (DVT)  Result Date: 08/14/2021  Lower Venous DVT Study Patient Name:  LAVERN MASLOW  Date of Exam:   08/14/2021 Medical Rec #: [id number]       Accession #:    [id number] Date of Birth: 11-18-1961     Patient Gender: M Patient Age:   4 years Exam Location:  [facility] Procedure:      VAS [place] LOWER EXTREMITY VENOUS (DVT) Referring Phys: Lyda Perone --------------------------------------------------------------------------------  Indications: Edema, and Pain.  Risk Factors: remote history of DVT in left lower extremity HIV. Anticoagulation: Xarelto. Comparison       Prior negative bilateral lower extremity venous duplex done Study:           05/2017 Performing Technologist: Sherren Kerns RVS  Examination Guidelines: A complete evaluation includes B-mode imaging, spectral Doppler, color Doppler, and power Doppler as needed of all accessible portions of each vessel. Bilateral testing is considered an integral part of a complete examination. Limited examinations for reoccurring indications may be performed as noted. The reflux portion of the exam is performed with the patient in reverse Trendelenburg.  +---------+---------------+---------+-----------+----------+--------------+ RIGHT    CompressibilityPhasicitySpontaneityPropertiesThrombus Aging +---------+---------------+---------+-----------+----------+--------------+ CFV      Full           Yes      Yes                                 +---------+---------------+---------+-----------+----------+--------------+ SFJ      Full                                                        +---------+---------------+---------+-----------+----------+--------------+ FV Prox  Full                                                        +---------+---------------+---------+-----------+----------+--------------+ FV Mid   Full                                                        +---------+---------------+---------+-----------+----------+--------------+ FV DistalFull                                                         +---------+---------------+---------+-----------+----------+--------------+ PFV      Full                                                        +---------+---------------+---------+-----------+----------+--------------+  POP      Full           Yes      Yes                                 +---------+---------------+---------+-----------+----------+--------------+ PTV      Full                                                        +---------+---------------+---------+-----------+----------+--------------+ PERO     Full                                                        +---------+---------------+---------+-----------+----------+--------------+   +---------+---------------+---------+-----------+----------+--------------+ LEFT     CompressibilityPhasicitySpontaneityPropertiesThrombus Aging +---------+---------------+---------+-----------+----------+--------------+ CFV      Full           Yes      Yes                                 +---------+---------------+---------+-----------+----------+--------------+ SFJ      Full                                                        +---------+---------------+---------+-----------+----------+--------------+ FV Prox  Full                                                        +---------+---------------+---------+-----------+----------+--------------+ FV Mid   Partial                                      Chronic        +---------+---------------+---------+-----------+----------+--------------+ FV DistalPartial                                      Chronic        +---------+---------------+---------+-----------+----------+--------------+ PFV      Full                                                        +---------+---------------+---------+-----------+----------+--------------+ POP      Full           Yes      Yes                                  +---------+---------------+---------+-----------+----------+--------------+ PTV      Full                                                        +---------+---------------+---------+-----------+----------+--------------+  PERO     Full                                                        +---------+---------------+---------+-----------+----------+--------------+    Summary: BILATERAL: -No evidence of popliteal cyst, bilaterally. RIGHT: - There is no evidence of deep vein thrombosis in the lower extremity.  - Ultrasound characteristics of enlarged lymph nodes are noted in the groin.  LEFT: - Findings consistent with chronic deep vein thrombosis involving the left femoral vein.  *See table(s) above for measurements and observations.    Preliminary         Scheduled Meds:  [MAR Hold] atorvastatin  40 mg Oral Daily   [MAR Hold] bictegravir-emtricitabine-tenofovir AF  1 tablet Oral Daily   [MAR Hold] colchicine  0.6 mg Oral Daily   [MAR Hold] hydrocerin   Topical BID   [MAR Hold] insulin aspart  0-15 Units Subcutaneous TID WC   [MAR Hold] sertraline  50 mg Oral Daily   sodium chloride flush  3 mL Intravenous Q12H   [MAR Hold] triamcinolone cream   Topical BID   Continuous Infusions:  sodium chloride     sodium chloride 105.3 mL/hr (08/15/21 0952)   sodium chloride 1 mL/kg/hr (08/15/21 0609)     LOS: 1 day    Time spent:  Zannie Cove, MD [facility]   08/15/2021, 10:33 AM

## 2021-08-15 NOTE — Plan of Care (Signed)
   Cardiology brief plan of care note. Briefly patient was amenable to heart catheterization: minimal non-obstructive CAD with normal right heart pressures.  Not suggestive of coronary disease or heart failure.   Patient notes that, despite note being treated for heart failure, his chest pain, arm pain, and PND/orthopnea has all resolved.  Feels great Exam notable non tender R radial access cite without bruit or hematoma. 70 cc of contrast uses (contrast limit based on GFR - 180 cc)  [facility] will sign off.   Medication Recommendations:  Xarelto, atorvastatin 40 mg with possible outpatient up-titration, lisinopril 5 mg can be deferred until outpatient assessment Other recommendations (labs, testing, etc):  follow up BMP with PCP, we will perform outpatient lipids Follow up as an outpatient:  We are working to arrange follow up  For questions or updates, please contact [facility] Please consult [email or web address] for contact info under Cardiology/STEMI.      Signed, Christell Constant, MD  08/15/2021, 12:32 PM

## 2021-08-15 NOTE — Progress Notes (Signed)
Progress Note  Patient Name: Craig [name] Date of Encounter: 08/15/2021  Primary Cardiologist: Christell Constant, MD   Subjective   Patient notes worsening itching overnight.  Notes that he needs to shower and won't get his heart catheterization until he showers.  No chest pain, SOB, near syncope or syncope.  Inpatient Medications    Scheduled Meds:  atorvastatin  40 mg Oral Daily   bictegravir-emtricitabine-tenofovir AF  1 tablet Oral Daily   colchicine  0.6 mg Oral Daily   hydrocerin   Topical BID   insulin aspart  0-15 Units Subcutaneous TID WC   sertraline  50 mg Oral Daily   sodium chloride flush  3 mL Intravenous Q12H   triamcinolone cream   Topical BID   Continuous Infusions:  sodium chloride     sodium chloride 1 mL/kg/hr (08/15/21 0609)   PRN Meds: sodium chloride, acetaminophen **OR** acetaminophen, hydrOXYzine, morphine injection, ondansetron **OR** ondansetron (ZOFRAN) IV, sodium chloride flush   Vital Signs    Vitals:   08/15/21 0012 08/15/21 0330 08/15/21 0349 08/15/21 0734  BP: (!) 144/73  (!) 145/78 124/61  Pulse: (!) 124  (!) 126 (!) 122  Resp: Temp: 99.5 F (37.5 C)  97.7 F (36.5 C) 98.9 F (37.2 C)  TempSrc: Oral  Oral Oral  SpO2: 99%  100% 100%  Weight:  103.9 kg    Height:        Intake/Output Summary (Last 24 hours) at 08/15/2021 0847 Last data filed at 08/15/2021 0754 Gross per 24 hour  Intake 1880.34 ml  Output 930 ml  Net 950.34 ml   Filed Weights   08/13/21 2253 08/14/21 1320 08/15/21 0330  Weight: 108.4 kg 105.3 kg 103.9 kg    Telemetry    Sinus Tachycardia - Personally Reviewed  ECG    No new - Personally Reviewed  Physical Exam   GEN: No acute distress.   Neck: No JVD Cardiac: RRR, no murmurs, rubs, or gallops.  Respiratory: Clear to auscultation bilaterally.  Able to lay flat without issue GI: Soft, nontender, non-distended  Integument: Eczematous skin changes UE and LE No deformity. Neuro:   Nonfocal  Psych: Normal affect   Labs    Chemistry Recent Labs  Lab 08/13/21 2300 08/14/21 0500 08/15/21 0610  NA 133* 133* 134*  K 4.8 5.4* 4.4  CL 102 107 108  CO2 21* 18* 18*  GLUCOSE 95 95 84  BUN CREATININE 2.32* 1.75* 1.37*  CALCIUM 9.1 8.2* 8.2*  PROT 8.7*  --   --   ALBUMIN 3.3*  --   --   AST 36  --   --   ALT 22  --   --   ALKPHOS 88  --   --   BILITOT 0.8  --   --   GFRNONAA 32* 45* 60*  ANIONGAP Hematology Recent Labs  Lab 08/13/21 2300 08/15/21 0610  WBC 8.5 6.8  RBC 4.43 3.81*  HGB 12.9* 11.2*  HCT 40.3 34.9*  MCV 91.0 91.6  MCH 29.1 29.4  MCHC 32.0 32.1  RDW 15.1 15.4  PLT 413* 343    Cardiac EnzymesNo results for input(s): TROPONINI in the last 168 hours. No results for input(s): TROPIPOC in the last 168 hours.   BNP Recent Labs  Lab 08/13/21 2300  BNP 9.4     DDimer No results for input(s): DDIMER in the last 168 hours.  Radiology    CT Angio Chest Pulmonary Embolism (PE) W or WO Contrast  Result Date: 08/14/2021 CLINICAL DATA:  Pulmonary embolus suspected with high probability. Patient has evidence of acute renal failure with severely decreased GFR of 32, down from GFR greater than 61 month ago. EXAM: CT ANGIOGRAPHY CHEST WITH CONTRAST TECHNIQUE: Multidetector CT imaging of the chest was performed using the standard protocol during bolus administration of intravenous contrast. Multiplanar CT image reconstructions and MIPs were obtained to evaluate the vascular anatomy. CONTRAST:  40 cc Isovue 370 COMPARISON:  06/24/2021 FINDINGS: Cardiovascular: A decreased contrast dose was used due to the patient's acute renal insufficiency. Despite adequate opacification of the central pulmonary arteries on the triggering scan, the opacification of the pulmonary arteries on the final examination is suboptimal resulting in nondiagnostic examination for pulmonary embolus. On the triggering scanned, there is no evidence of large  saddle embolus. The RV to LV ratio is normal suggesting no evidence of right heart strain. Given the finding of acute renal compromise, repeat bolusing of the patient is not recommended. Depending on the level of clinical suspicion, further evaluation radionuclide perfusion study could be obtained or if there was very high clinical suspicion, the patient could be empirically anticoagulated until the renal issues are resolved. Normal caliber thoracic aorta. No aortic dissection. Great vessel origins are patent. Normal heart size. No pericardial effusions. Mediastinum/Nodes: Esophagus is decompressed. Small esophageal hiatal hernia. Thyroid gland is unremarkable. Mediastinal lymph nodes are not enlarged. Moderately prominent axillary lymph nodes, possibly reactive or inflammatory. Lungs/Pleura: Focal scarring or atelectasis in the medial aspect of the left lower lung. Similar changes are present on the prior study suggesting chronic process. Otherwise, no focal consolidation or infiltration suggested. No pleural effusions. No pneumothorax. Upper Abdomen: No acute abnormalities demonstrated in the visualized upper abdomen. Musculoskeletal: Degenerative changes in the spine. No destructive bone lesions. Review of the MIP images confirms the above findings. IMPRESSION: 1. Indeterminate examination of the pulmonary arteries due to poor contrast bolus. Additional contrast is not recommended due to the patient's renal insufficiency. Depending on the level of clinical suspicion, consider radionuclide perfusion study or empiric anticoagulation until the renal issues are resolved. 2. Scarring or atelectasis in the medial left lung base, unchanged since prior study. Electronically Signed   By: Burman Nieves M.D.   On: 08/14/2021 01:34   US RENAL  Result Date: 08/14/2021 CLINICAL DATA:  Acute kidney injury EXAM: RENAL / URINARY TRACT ULTRASOUND COMPLETE COMPARISON:  Abdominal CT 06/24/2021 FINDINGS: Right Kidney: Renal  measurements: 10.9 x 4.3 x 6.1 cm = volume: 150 mL. Echogenicity within normal limits. No mass or hydronephrosis visualized. Left Kidney: Renal measurements: 12.7 x 6 x 8 cm = volume: 320 mL. Asymmetric volume is related to a simple renal cyst measuring up to 7.6 cm. Echogenicity within normal limits. No solid mass or hydronephrosis visualized. Bladder: Appears normal for degree of bladder distention. IMPRESSION: 1. No explanation for renal failure. 2. Large but simple left renal cyst. Electronically Signed   By: Marnee Spring M.D.   On: 08/14/2021 04:47   NM Pulmonary Perf and Vent  Result Date: 08/14/2021 CLINICAL DATA:  Concern for pulmonary embolism. Short of breath. Prior DVT and PE. Chest pain. EXAM: NUCLEAR MEDICINE PERFUSION LUNG SCAN TECHNIQUE: Perfusion images were obtained in multiple projections after intravenous injection of radiopharmaceutical. Ventilation scans intentionally deferred if perfusion scan and chest x-ray adequate for interpretation during COVID 19 epidemic. RADIOPHARMACEUTICALS:  4.4 mCi Tc-56m MAA IV COMPARISON:  08/14/2021  FINDINGS: No wedge-shaped peripheral perfusion defect within the LEFT or RIGHT lung to suggest acute pulmonary embolism. Elevation of the LEFT hemidiaphragm noted. IMPRESSION: No evidence acute pulmonary embolism. Electronically Signed   By: Genevive BiStewart  Edmunds M.D.   On: 08/14/2021 16:04   DG Chest Portable 1 View  Result Date: 08/13/2021 CLINICAL DATA:  Chest pain, tachycardia EXAM: PORTABLE CHEST 1 VIEW COMPARISON:  06/24/2021 FINDINGS: The heart size and mediastinal contours are within normal limits. Both lungs are clear. The visualized skeletal structures are unremarkable. IMPRESSION: No active disease. Electronically Signed   By: Helyn NumbersAshesh  Parikh M.D.   On: 08/13/2021 23:25   ECHOCARDIOGRAM COMPLETE  Result Date: 08/14/2021    ECHOCARDIOGRAM REPORT   Patient Name:   Leda RoysBRUCE Tietze Date of Exam: 08/14/2021 Medical Rec #:  [id number]     Height:       72.0  in Accession #:    [id number]    Weight:       239.0 lb Date of Birth:  21-Oct-1961    BSA:          2.298 m Patient Age:    59 years      BP:           144/81 mmHg Patient Gender: M             HR:           116 bpm. Exam Location:  Inpatient Procedure: 2D Echo, Cardiac Doppler and Color Doppler Indications:    Chest Pain  History:        Patient has no prior history of Echocardiogram examinations.  Sonographer:    Roosvelt Maserachel Lane RDCS Referring Phys: [phone] JARED M GARDNER IMPRESSIONS  1. Left ventricular ejection fraction, by estimation, is 65 to 70%. The left ventricle has normal function. The left ventricle has no regional wall motion abnormalities. There is moderate left ventricular hypertrophy. Left ventricular diastolic parameters are indeterminate.  2. Right ventricular systolic function is normal. The right ventricular size is normal. There is normal pulmonary artery systolic pressure. The estimated right ventricular systolic pressure is 32.6 mmHg.  3. The mitral valve is normal in structure. No evidence of mitral valve regurgitation.  4. The aortic valve was not well visualized. Aortic valve regurgitation is not visualized. No aortic stenosis is present.  5. The inferior vena cava is normal in size with greater than 50% respiratory variability, suggesting right atrial pressure of 3 mmHg. FINDINGS  Left Ventricle: Left ventricular ejection fraction, by estimation, is 65 to 70%. The left ventricle has normal function. The left ventricle has no regional wall motion abnormalities. The left ventricular internal cavity size was normal in size. There is  moderate left ventricular hypertrophy. Left ventricular diastolic parameters are indeterminate. Right Ventricle: The right ventricular size is normal. No increase in right ventricular wall thickness. Right ventricular systolic function is normal. There is normal pulmonary artery systolic pressure. The tricuspid regurgitant velocity is 2.72 m/s, and  with an assumed  right atrial pressure of 3 mmHg, the estimated right ventricular systolic pressure is 32.6 mmHg. Left Atrium: Left atrial size was normal in size. Right Atrium: Right atrial size was normal in size. Pericardium: Trivial pericardial effusion is present. Mitral Valve: The mitral valve is normal in structure. No evidence of mitral valve regurgitation. Tricuspid Valve: The tricuspid valve is normal in structure. Tricuspid valve regurgitation is trivial. Aortic Valve: The aortic valve was not well visualized. Aortic valve regurgitation is not visualized. No aortic stenosis is present.  Aortic valve mean gradient measures 5.0 mmHg. Aortic valve peak gradient measures 8.2 mmHg. Aortic valve area, by VTI measures 3.55 cm. Pulmonic Valve: The pulmonic valve was grossly normal. Pulmonic valve regurgitation is not visualized. Aorta: The aortic root and ascending aorta are structurally normal, with no evidence of dilitation. Venous: The inferior vena cava is normal in size with greater than 50% respiratory variability, suggesting right atrial pressure of 3 mmHg. IAS/Shunts: The interatrial septum was not well visualized.  LEFT VENTRICLE PLAX 2D LVIDd:         4.30 cm LVIDs:         2.50 cm LV PW:         1.30 cm LV IVS:        1.30 cm LVOT diam:     2.30 cm LV SV:         73 LV SV Index:   32 LVOT Area:     4.15 cm  RIGHT VENTRICLE RV Basal diam:  3.40 cm RV S prime:     20.50 cm/s TAPSE (M-mode): 2.9 cm LEFT ATRIUM             Index       RIGHT ATRIUM           Index LA diam:        3.90 cm 1.70 cm/m  RA Area:     17.20 cm LA Vol (A2C):   55.5 ml 24.15 ml/m RA Volume:   43.40 ml  18.88 ml/m LA Vol (A4C):   59.6 ml 25.93 ml/m LA Biplane Vol: 58.0 ml 25.24 ml/m  AORTIC VALVE AV Area (Vmax):    3.81 cm AV Area (Vmean):   3.49 cm AV Area (VTI):     3.55 cm AV Vmax:           143.00 cm/s AV Vmean:          99.100 cm/s AV VTI:            0.206 m AV Peak Grad:      8.2 mmHg AV Mean Grad:      5.0 mmHg LVOT Vmax:          131.00 cm/s LVOT Vmean:        83.200 cm/s LVOT VTI:          0.176 m LVOT/AV VTI ratio: 0.85  AORTA Ao Root diam: 3.60 cm Ao Asc diam:  3.20 cm TRICUSPID VALVE TR Peak grad:   29.6 mmHg TR Vmax:        272.00 cm/s  SHUNTS Systemic VTI:  0.18 m Systemic Diam: 2.30 cm Epifanio Lesches MD Electronically signed by Epifanio Lesches MD Signature Date/Time: 08/14/2021/3:30:12 PM    Final    VAS [place] LOWER EXTREMITY VENOUS (DVT)  Result Date: 08/14/2021  Lower Venous DVT Study Patient Name:  LAWERNCE EARLL  Date of Exam:   08/14/2021 Medical Rec #: [id number]      Accession #:    [id number] Date of Birth: 10-30-1961     Patient Gender: M Patient Age:   25 years Exam Location:  [facility] Procedure:      VAS [place] LOWER EXTREMITY VENOUS (DVT) Referring Phys: Lyda Perone --------------------------------------------------------------------------------  Indications: Edema, and Pain.  Risk Factors: remote history of DVT in left lower extremity HIV. Anticoagulation: Xarelto. Comparison       Prior negative bilateral lower extremity venous duplex done Study:           05/2017 Performing Technologist:  Sharion Dove RVS  Examination Guidelines: A complete evaluation includes B-mode imaging, spectral Doppler, color Doppler, and power Doppler as needed of all accessible portions of each vessel. Bilateral testing is considered an integral part of a complete examination. Limited examinations for reoccurring indications may be performed as noted. The reflux portion of the exam is performed with the patient in reverse Trendelenburg.  +---------+---------------+---------+-----------+----------+--------------+ RIGHT    CompressibilityPhasicitySpontaneityPropertiesThrombus Aging +---------+---------------+---------+-----------+----------+--------------+ CFV      Full           Yes      Yes                                 +---------+---------------+---------+-----------+----------+--------------+ SFJ      Full                                                         +---------+---------------+---------+-----------+----------+--------------+ FV Prox  Full                                                        +---------+---------------+---------+-----------+----------+--------------+ FV Mid   Full                                                        +---------+---------------+---------+-----------+----------+--------------+ FV DistalFull                                                        +---------+---------------+---------+-----------+----------+--------------+ PFV      Full                                                        +---------+---------------+---------+-----------+----------+--------------+ POP      Full           Yes      Yes                                 +---------+---------------+---------+-----------+----------+--------------+ PTV      Full                                                        +---------+---------------+---------+-----------+----------+--------------+ PERO     Full                                                        +---------+---------------+---------+-----------+----------+--------------+   +---------+---------------+---------+-----------+----------+--------------+  LEFT     CompressibilityPhasicitySpontaneityPropertiesThrombus Aging +---------+---------------+---------+-----------+----------+--------------+ CFV      Full           Yes      Yes                                 +---------+---------------+---------+-----------+----------+--------------+ SFJ      Full                                                        +---------+---------------+---------+-----------+----------+--------------+ FV Prox  Full                                                        +---------+---------------+---------+-----------+----------+--------------+ FV Mid   Partial                                       Chronic        +---------+---------------+---------+-----------+----------+--------------+ FV DistalPartial                                      Chronic        +---------+---------------+---------+-----------+----------+--------------+ PFV      Full                                                        +---------+---------------+---------+-----------+----------+--------------+ POP      Full           Yes      Yes                                 +---------+---------------+---------+-----------+----------+--------------+ PTV      Full                                                        +---------+---------------+---------+-----------+----------+--------------+ PERO     Full                                                        +---------+---------------+---------+-----------+----------+--------------+    Summary: BILATERAL: -No evidence of popliteal cyst, bilaterally. RIGHT: - There is no evidence of deep vein thrombosis in the lower extremity.  - Ultrasound characteristics of enlarged lymph nodes are noted in the groin.  LEFT: - Findings consistent with chronic deep vein thrombosis involving the left femoral vein.  *See table(s) above for measurements and observations.    Preliminary  Patient Profile     60 y.o. male with a history of unprovoked DVT & PE in 2013 lats dose of DOAC on Saturday, HIV controlled on Biktarvy, HTN with DM, Eczema,  who complained of chest pain.  Assessment & Plan    Chest pain with syncope PND and Orthopnea Sinus tachycardia HTN with DM HIV controlled on Biktarvy HLD Eczema -Chest pain is now resolved - given that patient has tachycardia that would prevent CCTA but notable LAD calcium with CAD risk factors as listed above - described orthopnea and PND but with normal BNP - given sub-optimal NM Scanner at [facility], we had elected for heart catheterization - had long conversation with patient for which we recommended that he do  this procedure even though they are calling from him while he wants to go take a shower - we have discuss that while symptoms have resolve, he has also had two ED evals this month for these concerns - if patient defers heart catheterization and is asymptomatic, we will plan for outpatient assessment with D-SPECT NM Stress (Lexiscan) and outpatient follow up  - if patient defers LHC we wil transition back to Xarelto  - lipids pending this AM - continue lipitor 40 mg   Hx of PE in 2013 Chronic anticoagulation - heparin presently with level mgmt; pending xarelto either post cath or if cath is deferred.      AKI - sCr 1.32 (continuing to improve with fluid   Time Spent Directly with Patient:   I have spent a total of 35 minutes with the patient reviewing notes, imaging, EKGs, labs and examining the patient as well as establishing an assessment and plan that was discussed personally with the patient.  > 50% of time was spent in direct patient care discussing heart catheterization and re-reviewing his symptoms with him.  Patient is not sure if he can wait to shower for his heart catheterization.     For questions or updates, please contact [facility] Please consult [email or web address] for contact info under Cardiology/STEMI.      Signed, Christell Constant, MD  08/15/2021, 8:47 AM

## 2021-08-16 ENCOUNTER — Ambulatory Visit: Admit: 2021-08-16 | Payer: MEDICARE

## 2021-08-16 ENCOUNTER — Encounter (HOSPITAL_COMMUNITY): Payer: Self-pay | Admitting: Cardiology

## 2021-08-24 ENCOUNTER — Other Ambulatory Visit: Payer: Self-pay

## 2021-08-24 ENCOUNTER — Emergency Department (HOSPITAL_COMMUNITY)
Admission: EM | Admit: 2021-08-24 | Discharge: 2021-08-24 | Disposition: A | Discharge status: Home / Self Care | Payer: Medicare Other | Attending: Emergency Medicine | Admitting: Emergency Medicine

## 2021-08-24 ENCOUNTER — Encounter (HOSPITAL_COMMUNITY): Payer: Self-pay

## 2021-08-24 ENCOUNTER — Emergency Department (HOSPITAL_COMMUNITY): Payer: Medicare Other

## 2021-08-24 DIAGNOSIS — L309 Dermatitis, unspecified: Secondary | ICD-10-CM | POA: Insufficient documentation

## 2021-08-24 DIAGNOSIS — Z91040 Latex allergy status: Secondary | ICD-10-CM | POA: Diagnosis not present

## 2021-08-24 DIAGNOSIS — Z21 Asymptomatic human immunodeficiency virus [HIV] infection status: Secondary | ICD-10-CM | POA: Diagnosis not present

## 2021-08-24 DIAGNOSIS — I110 Hypertensive heart disease with heart failure: Secondary | ICD-10-CM | POA: Insufficient documentation

## 2021-08-24 DIAGNOSIS — I5032 Chronic diastolic (congestive) heart failure: Secondary | ICD-10-CM | POA: Diagnosis not present

## 2021-08-24 DIAGNOSIS — Z79899 Other long term (current) drug therapy: Secondary | ICD-10-CM | POA: Insufficient documentation

## 2021-08-24 DIAGNOSIS — Z7901 Long term (current) use of anticoagulants: Secondary | ICD-10-CM | POA: Insufficient documentation

## 2021-08-24 DIAGNOSIS — S63501A Unspecified sprain of right wrist, initial encounter: Secondary | ICD-10-CM | POA: Diagnosis not present

## 2021-08-24 DIAGNOSIS — Z7984 Long term (current) use of oral hypoglycemic drugs: Secondary | ICD-10-CM | POA: Diagnosis not present

## 2021-08-24 DIAGNOSIS — S93401A Sprain of unspecified ligament of right ankle, initial encounter: Secondary | ICD-10-CM | POA: Diagnosis not present

## 2021-08-24 DIAGNOSIS — W010XXA Fall on same level from slipping, tripping and stumbling without subsequent striking against object, initial encounter: Secondary | ICD-10-CM | POA: Diagnosis not present

## 2021-08-24 DIAGNOSIS — B20 Human immunodeficiency virus [HIV] disease: Secondary | ICD-10-CM

## 2021-08-24 DIAGNOSIS — M25531 Pain in right wrist: Secondary | ICD-10-CM | POA: Insufficient documentation

## 2021-08-24 DIAGNOSIS — R21 Rash and other nonspecific skin eruption: Secondary | ICD-10-CM

## 2021-08-24 DIAGNOSIS — S6991XA Unspecified injury of right wrist, hand and finger(s), initial encounter: Secondary | ICD-10-CM | POA: Diagnosis present

## 2021-08-24 MED ORDER — TRAMADOL HCL 50 MG PO TABS: ORAL_TABLET | ORAL | 0 refills | Status: DC

## 2021-08-24 NOTE — ED Triage Notes (Addendum)
Pt BIB [facility] from home c/o a fall. Pt c/o of pain to right ankle. Ankle is swelling. Pt also stated he started having boils on his face on Sunday and now they are everywhere. Some are oozing, could possibly be monkey pox. Pt has generalized weakness, uses a cane.
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
## 2021-08-24 NOTE — Discharge Instructions (Addendum)
1.  Wear your wrist brace and your walking boot to help with pain from your injuries.  Continue to elevate your wrist and your ankle is much as possible.  Continue to apply a well wrapped ice pack for about 20 minutes every few hours.  You may take 1-2 tramadol tablets as prescribed for pain.  Along with this, take 2 extra strength acetaminophen tablets.  Once the pain is improving, you may stop taking the tramadol and continue the acetaminophen. 2.  You had testing for monkey pox.  These test will not be returned today.  You will follow-up with the infectious disease clinic for your results and ongoing treatment for your HIV and Monkey Pox if needed.  Careful to isolate from others and avoid any contact at this time until your results are returned.

## 2021-08-25 LAB — GC/CHLAMYDIA PROBE AMP (~~LOC~~) NOT AT ARMC
Chlamydia: NEGATIVE
Comment: NEGATIVE
Comment: NORMAL
Neisseria Gonorrhea: NEGATIVE

## 2021-08-25 LAB — RPR
RPR Ser Ql: REACTIVE — AB
RPR Titer: 1:2 {titer}

## 2021-08-25 LAB — MONKEYPOX VIRUS DNA, QUALITATIVE REAL-TIME PCR: Orthopoxvirus DNA, QL PCR: NOT DETECTED

## 2021-08-27 LAB — T.PALLIDUM AB, TOTAL: T Pallidum Abs: REACTIVE — AB

## 2021-08-29 ENCOUNTER — Telehealth: Payer: Self-pay

## 2021-08-29 NOTE — Telephone Encounter (Signed)
Patient called office today to schedule appointment for recent hospital follow up. Was seen on 9/1 for rash and fall. Patient still has rash present today. Tested negative for MPX at ED. Dr. Daiva Eves is okay with scheduling patient for 15 min appt.  Not able to reach patient at this time to schedule follow up visit. Juanita Laster, RMA

## 2021-08-30 ENCOUNTER — Ambulatory Visit (INDEPENDENT_AMBULATORY_CARE_PROVIDER_SITE_OTHER): Payer: Medicare Other | Admitting: Internal Medicine

## 2021-08-30 ENCOUNTER — Other Ambulatory Visit: Payer: Self-pay

## 2021-08-30 ENCOUNTER — Encounter: Payer: Self-pay | Admitting: Internal Medicine

## 2021-08-30 VITALS — BP 139/83 | HR 126 | Temp 98.2°F | Wt 208.0 lb

## 2021-08-30 DIAGNOSIS — R21 Rash and other nonspecific skin eruption: Secondary | ICD-10-CM | POA: Diagnosis not present

## 2021-08-30 DIAGNOSIS — B20 Human immunodeficiency virus [HIV] disease: Secondary | ICD-10-CM | POA: Diagnosis not present

## 2021-08-30 MED ORDER — TRIAMCINOLONE ACETONIDE 0.1 % EX OINT: 1.0000 "application " | TOPICAL_OINTMENT | Freq: Two times a day (BID) | CUTANEOUS | 1 refills | Status: AC | PRN

## 2021-08-30 NOTE — Addendum Note (Signed)
Addended by: Juanita Laster on: 08/30/2021 12:12 PM   Modules accepted: Orders

## 2021-08-30 NOTE — Telephone Encounter (Signed)
Patient walked in reporting he never heard back from office yesterday. Appears team did try to reach out to discuss rash. Patient scheduled for same day appointment with Dr. Earlene Plater at 10:45 as it was first available.   Emarion Toral Loyola Mast, RN
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
## 2021-08-30 NOTE — Patient Instructions (Signed)
Keep your dermatology appointment  I refilled your triamcionolone cream  Schedule an appointment with Dr Daiva Eves as soon as possible for follow up in the next few weeks

## 2021-08-31 ENCOUNTER — Other Ambulatory Visit: Payer: Self-pay

## 2021-08-31 ENCOUNTER — Other Ambulatory Visit: Payer: Medicare Other

## 2021-08-31 DIAGNOSIS — B20 Human immunodeficiency virus [HIV] disease: Secondary | ICD-10-CM

## 2021-09-01 LAB — T-HELPER CELL (CD4) - (RCID CLINIC ONLY)
CD4 % Helper T Cell: 41 % (ref 33–65)
CD4 T Cell Abs: 488 /uL (ref 400–1790)

## 2021-09-04 ENCOUNTER — Institutional Professional Consult (permissible substitution): Admit: 2021-09-04 | Discharge: 2021-09-05 | Payer: MEDICARE

## 2021-09-04 LAB — CBC WITH DIFFERENTIAL/PLATELET
Absolute Monocytes: 477 cells/uL (ref 200–950)
Basophils Absolute: 64 cells/uL (ref 0–200)
Basophils Relative: 0.6 %
Eosinophils Absolute: 244 cells/uL (ref 15–500)
Eosinophils Relative: 2.3 %
HCT: 36.2 % — ABNORMAL LOW (ref 38.5–50.0)
Hemoglobin: 11.7 g/dL — ABNORMAL LOW (ref 13.2–17.1)
Lymphs Abs: 1314 cells/uL (ref 850–3900)
MCH: 28.7 pg (ref 27.0–33.0)
MCHC: 32.3 g/dL (ref 32.0–36.0)
MCV: 88.9 fL (ref 80.0–100.0)
MPV: 9.5 fL (ref 7.5–12.5)
Monocytes Relative: 4.5 %
Neutro Abs: 8501 cells/uL — ABNORMAL HIGH (ref 1500–7800)
Neutrophils Relative %: 80.2 %
Platelets: 475 10*3/uL — ABNORMAL HIGH (ref 140–400)
RBC: 4.07 10*6/uL — ABNORMAL LOW (ref 4.20–5.80)
RDW: 15 % (ref 11.0–15.0)
Total Lymphocyte: 12.4 %
WBC: 10.6 10*3/uL (ref 3.8–10.8)

## 2021-09-04 LAB — COMPLETE METABOLIC PANEL WITH GFR
AG Ratio: 0.5 (calc) — ABNORMAL LOW (ref 1.0–2.5)
ALT: 32 U/L (ref 9–46)
AST: 33 U/L (ref 10–35)
Albumin: 3.2 g/dL — ABNORMAL LOW (ref 3.6–5.1)
Alkaline phosphatase (APISO): 70 U/L (ref 35–144)
BUN/Creatinine Ratio: 9 (calc) (ref 6–22)
BUN: 17 mg/dL (ref 7–25)
CO2: 21 mmol/L (ref 20–32)
Calcium: 9.3 mg/dL (ref 8.6–10.3)
Chloride: 100 mmol/L (ref 98–110)
Creat: 1.95 mg/dL — ABNORMAL HIGH (ref 0.70–1.30)
Globulin: 5.9 g/dL (calc) — ABNORMAL HIGH (ref 1.9–3.7)
Glucose, Bld: 125 mg/dL — ABNORMAL HIGH (ref 65–99)
Potassium: 4.3 mmol/L (ref 3.5–5.3)
Sodium: 132 mmol/L — ABNORMAL LOW (ref 135–146)
Total Bilirubin: 0.3 mg/dL (ref 0.2–1.2)
Total Protein: 9.1 g/dL — ABNORMAL HIGH (ref 6.1–8.1)
eGFR: 39 mL/min/{1.73_m2} — ABNORMAL LOW (ref >=60)

## 2021-09-04 LAB — FLUORESCENT TREPONEMAL AB(FTA)-IGG-BLD: Fluorescent Treponemal ABS: REACTIVE — AB

## 2021-09-04 LAB — HIV-1 RNA QUANT-NO REFLEX-BLD
HIV 1 RNA Quant: NOT DETECTED Copies/mL
HIV-1 RNA Quant, Log: NOT DETECTED Log cps/mL

## 2021-09-04 LAB — RPR TITER: RPR Titer: 1:4 {titer} — ABNORMAL HIGH

## 2021-09-04 LAB — RPR: RPR Ser Ql: REACTIVE — AB

## 2021-09-04 MED ORDER — METHOCARBAMOL 750 MG TABLET
ORAL_TABLET | 2 refills | 0 days | Status: CP
Start: 2021-09-04 — End: ?

## 2021-09-04 MED ORDER — HYDROXYZINE HCL 25 MG TABLET
ORAL_TABLET | ORAL | 5 refills | 0.00000 days | Status: CP
Start: 2021-09-04 — End: ?

## 2021-09-18 ENCOUNTER — Ambulatory Visit (INDEPENDENT_AMBULATORY_CARE_PROVIDER_SITE_OTHER): Payer: Medicare Other | Admitting: Infectious Disease

## 2021-09-18 ENCOUNTER — Other Ambulatory Visit: Payer: Self-pay

## 2021-09-18 ENCOUNTER — Encounter: Payer: Self-pay | Admitting: Infectious Disease

## 2021-09-18 VITALS — BP 118/79 | HR 117 | Temp 98.1°F | Wt 212.0 lb

## 2021-09-18 DIAGNOSIS — M009 Pyogenic arthritis, unspecified: Secondary | ICD-10-CM

## 2021-09-18 DIAGNOSIS — Z23 Encounter for immunization: Secondary | ICD-10-CM

## 2021-09-18 DIAGNOSIS — N179 Acute kidney failure, unspecified: Secondary | ICD-10-CM

## 2021-09-18 DIAGNOSIS — I10 Essential (primary) hypertension: Secondary | ICD-10-CM

## 2021-09-18 DIAGNOSIS — I2510 Atherosclerotic heart disease of native coronary artery without angina pectoris: Secondary | ICD-10-CM

## 2021-09-18 DIAGNOSIS — Z7185 Encounter for immunization safety counseling: Secondary | ICD-10-CM

## 2021-09-18 DIAGNOSIS — Z86711 Personal history of pulmonary embolism: Secondary | ICD-10-CM

## 2021-09-18 DIAGNOSIS — N289 Disorder of kidney and ureter, unspecified: Secondary | ICD-10-CM

## 2021-09-18 DIAGNOSIS — Z794 Long term (current) use of insulin: Secondary | ICD-10-CM

## 2021-09-18 DIAGNOSIS — E669 Obesity, unspecified: Secondary | ICD-10-CM

## 2021-09-18 DIAGNOSIS — B20 Human immunodeficiency virus [HIV] disease: Secondary | ICD-10-CM

## 2021-09-18 DIAGNOSIS — M25571 Pain in right ankle and joints of right foot: Secondary | ICD-10-CM

## 2021-09-18 LAB — BASIC METABOLIC PANEL WITH GFR
BUN/Creatinine Ratio: 19 (calc) (ref 6–22)
BUN: 26 mg/dL — ABNORMAL HIGH (ref 7–25)
CO2: 24 mmol/L (ref 20–32)
Calcium: 8.5 mg/dL — ABNORMAL LOW (ref 8.6–10.3)
Chloride: 103 mmol/L (ref 98–110)
Creat: 1.34 mg/dL — ABNORMAL HIGH (ref 0.70–1.30)
Glucose, Bld: 249 mg/dL — ABNORMAL HIGH (ref 65–99)
Potassium: 4.2 mmol/L (ref 3.5–5.3)
Sodium: 134 mmol/L — ABNORMAL LOW (ref 135–146)
eGFR: 61 mL/min/{1.73_m2} (ref >=60)

## 2021-09-18 MED ORDER — BIKTARVY 50-200-25 MG PO TABS: 1.0000 | ORAL_TABLET | Freq: Every day | ORAL | 11 refills | Status: DC
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
## 2021-09-18 NOTE — Progress Notes (Signed)
   Covid-19 Vaccination Clinic  Name:  Beren Yniguez    MRN: [id number] DOB: [birth year]  09/18/2021  Mr. Iyengar was observed post Covid-19 immunization for 15 minutes without incident. He was provided with Vaccine Information Sheet and instruction to access the V-Safe system.   Mr. Docken was instructed to call 911 with any severe reactions post vaccine: Difficulty breathing  Swelling of face and throat  A fast heartbeat  A bad rash all over body  Dizziness and weakness    Juanita Laster, RMA

## 2021-09-25 LAB — VARICELLA-ZOSTER BY PCR: VARICELLA ZOSTER VIRUS (VZV) DNA, QL RT PCR: NOT DETECTED

## 2021-09-25 LAB — HERPES SIMPLEX VIRUS CULTURE W/RFLX TO TYPING

## 2021-09-25 LAB — HERPES SIMPLEX VIRUS, TYPE 1 AND 2 DNA,QUAL,RT PCR
HSV 1 DNA: NOT DETECTED
HSV 2 DNA: NOT DETECTED

## 2021-09-25 LAB — TEST AUTHORIZATION

## 2021-09-25 LAB — VARICELLA ZOSTER VIRUS,RAPID METHOD,CULTURE

## 2021-09-26 ENCOUNTER — Ambulatory Visit: Admit: 2021-09-26 | Discharge: 2021-09-27 | Payer: MEDICARE

## 2021-09-26 DIAGNOSIS — I739 Peripheral vascular disease, unspecified: Principal | ICD-10-CM

## 2021-09-26 DIAGNOSIS — M79671 Pain in right foot: Principal | ICD-10-CM

## 2021-09-26 DIAGNOSIS — E119 Type 2 diabetes mellitus without complications: Principal | ICD-10-CM

## 2021-09-26 DIAGNOSIS — I2699 Other pulmonary embolism without acute cor pulmonale: Principal | ICD-10-CM

## 2021-09-26 MED ORDER — RYBELSUS 14 MG TABLET
ORAL_TABLET | Freq: Every day | ORAL | prn refills | 0 days | Status: CP
Start: 2021-09-26 — End: ?

## 2021-10-10 ENCOUNTER — Ambulatory Visit: Admit: 2021-10-10 | Payer: MEDICARE

## 2021-10-10 ENCOUNTER — Other Ambulatory Visit: Payer: Self-pay

## 2021-10-10 ENCOUNTER — Ambulatory Visit (INDEPENDENT_AMBULATORY_CARE_PROVIDER_SITE_OTHER): Payer: Medicare Other | Admitting: Podiatry

## 2021-10-10 ENCOUNTER — Encounter: Payer: Self-pay | Admitting: Podiatry

## 2021-10-10 DIAGNOSIS — S93491A Sprain of other ligament of right ankle, initial encounter: Secondary | ICD-10-CM

## 2021-10-10 DIAGNOSIS — M76821 Posterior tibial tendinitis, right leg: Secondary | ICD-10-CM
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
## 2021-10-16 ENCOUNTER — Ambulatory Visit: Admit: 2021-10-16 | Discharge: 2021-10-17 | Payer: MEDICARE

## 2021-10-16 DIAGNOSIS — L209 Atopic dermatitis, unspecified: Principal | ICD-10-CM

## 2021-10-16 DIAGNOSIS — R21 Rash and other nonspecific skin eruption: Principal | ICD-10-CM

## 2021-10-16 MED ORDER — CLOBETASOL 0.05 % TOPICAL OINTMENT
Freq: Two times a day (BID) | TOPICAL | 2 refills | 0.00000 days | Status: CP | PRN
Start: 2021-10-16 — End: 2022-10-16

## 2021-10-27 ENCOUNTER — Ambulatory Visit: Admit: 2021-10-27 | Discharge: 2021-10-27 | Payer: MEDICARE

## 2021-10-27 DIAGNOSIS — E119 Type 2 diabetes mellitus without complications: Principal | ICD-10-CM

## 2021-10-27 DIAGNOSIS — I10 Essential (primary) hypertension: Principal | ICD-10-CM

## 2021-10-27 DIAGNOSIS — B20 Human immunodeficiency virus [HIV] disease: Principal | ICD-10-CM

## 2021-10-27 DIAGNOSIS — F32A Depression, unspecified depression type: Principal | ICD-10-CM

## 2021-10-27 MED ORDER — METHOCARBAMOL 750 MG TABLET
ORAL_TABLET | 2 refills | 0 days | Status: CP
Start: 2021-10-27 — End: ?

## 2021-10-27 MED ORDER — COLCHICINE 0.6 MG TABLET
ORAL_TABLET | ORAL | 3 refills | 0.00000 days | Status: CP
Start: 2021-10-27 — End: ?

## 2021-10-27 MED ORDER — BIKTARVY 50 MG-200 MG-25 MG TABLET
ORAL_TABLET | Freq: Every day | ORAL | 11 refills | 0.00000 days | Status: CN
Start: 2021-10-27 — End: ?

## 2021-10-27 MED ORDER — LISINOPRIL 10 MG TABLET
ORAL_TABLET | Freq: Every day | ORAL | 3 refills | 90 days | Status: CP
Start: 2021-10-27 — End: 2022-10-27

## 2021-10-27 MED ORDER — METOPROLOL SUCCINATE ER 25 MG TABLET,EXTENDED RELEASE 24 HR
ORAL_TABLET | Freq: Every day | ORAL | 3 refills | 90 days | Status: CP
Start: 2021-10-27 — End: ?

## 2021-10-27 MED ORDER — SERTRALINE 50 MG TABLET
ORAL_TABLET | Freq: Every day | ORAL | 3 refills | 90 days | Status: CP
Start: 2021-10-27 — End: 2022-10-27

## 2021-11-06 ENCOUNTER — Ambulatory Visit: Payer: Medicare Other | Admitting: Internal Medicine
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
## 2021-11-21 ENCOUNTER — Ambulatory Visit (INDEPENDENT_AMBULATORY_CARE_PROVIDER_SITE_OTHER): Payer: Medicare Other | Admitting: Podiatry

## 2021-11-21 ENCOUNTER — Other Ambulatory Visit: Payer: Self-pay

## 2021-11-21 DIAGNOSIS — S93491A Sprain of other ligament of right ankle, initial encounter: Secondary | ICD-10-CM

## 2021-11-21 DIAGNOSIS — M76821 Posterior tibial tendinitis, right leg: Secondary | ICD-10-CM

## 2021-11-21 NOTE — Progress Notes (Signed)
  Subjective:  Patient ID: Craig [name], male    DOB: 1961/03/19,  MRN: [id number]  Chief Complaint  Patient presents with   Tendonitis    6 week follow up    60 y.o. male returns for follow up with the above complaint. History confirmed with patient.  Doing much better he wore the boot until last week. Says its 100% now.  Objective:  Physical Exam: warm, good capillary refill, no trophic changes or ulcerative lesions, normal DP and PT pulses, and normal sensory exam. Left Foot:  Peripheral edema pitting +1 Right Foot: Peripheral edema pitting +1, no pain today on exam and 5/5 strength  Radiographs: Multiple views x-ray of the right foot: Reviewed right ankle radiographs from the ER no acute osseous abnormalities Assessment:   1. Posterior tibial tendinitis of right leg   2. Sprain of anterior talofibular ligament of right ankle, initial encounter      Plan:  Patient was evaluated and treated and all questions answered.   Overall doing well he has 100% improvement on the right side and some slight pain on the left side which may be overcompensation.  May need to wear the boot on the left side some.  At this point we can hold off on the MRI or physical therapy.  And we will see him back as needed for this or other issues   Return if symptoms worsen or fail to improve.

## 2021-11-21 NOTE — Patient Instructions (Signed)
Posterior Tibial Tendinitis  Posterior tibial tendinitis is irritation of a tendon called the posterior tibial tendon. Your posterior tibial tendon is a cord-like tissue that connects bones of your lower leg and foot to a muscle that: Supports your arch. Helps you raise up on your toes. Helps you turn your foot down and in. This condition causes foot and ankle pain. It can also lead to a flat foot. What are the causes? This condition is most often caused by repeated stress to the tendon (overuse injury). It can also be caused by a sudden injury that stresses the tendon, such as landing on your foot after jumping or falling. What increases the risk? This condition is more likely to develop in: People who play a sport that involves putting a lot of pressure on the feet, such as: Basketball. Tennis. Soccer. Hockey. Runners. Females who are older than 59 years of age and are overweight. People with diabetes. People with decreased foot stability. People with flat feet. What are the signs or symptoms? Symptoms include: Pain in the inner ankle. Pain at the arch of your foot. Pain that gets worse with running, walking, or standing. Swelling on the inside of your ankle and foot. Weakness in your ankle or foot. Inability to stand up on tiptoe. Flattening of the arch of your foot. How is this diagnosed? This condition may be diagnosed based on: Your symptoms. Your medical history. A physical exam. Tests, such as: X-ray. MRI. Ultrasound. How is this treated? This condition may be treated by: Putting ice to the injured area. Taking NSAIDs, such as ibuprofen, to reduce pain and swelling. Wearing a special shoe or shoe insert to support your arch (orthotic). Having physical therapy. Replacing high-impact exercise with low-impact exercise, such as swimming or cycling. If your symptoms do not improve with these treatments, you may need to wear a splint, removable walking boot, or short  leg cast for 6-8 weeks to keep your foot and ankle still (immobilized). Follow these instructions at home: If you have a cast, splint, or boot: Keep it clean and dry. Check the skin around it every day. Tell your health care provider about any concerns. If you have a cast: Do not stick anything inside it to scratch your skin. Doing that increases your risk of infection. You may put lotion on dry skin around the edges of the cast. Do not put lotion on the skin underneath the cast. If you have a splint or boot: Wear it as told by your health care provider. Remove it only as told by your health care provider. Loosen it if your toes tingle, become numb, or turn cold and blue. Bathing Do not take baths, swim, or use a hot tub until your health care provider approves. Ask your health care provider if you may take showers. If your cast, splint, or boot is not waterproof: Do not let it get wet. Cover it with a waterproof covering while you take a bath or a shower. Managing pain and swelling   If directed, put ice on the injured area. If you have a removable splint or boot, remove it as told by your health care provider. Put ice in a plastic bag. Place a towel between your skin and the bag or between your cast and the bag. Leave the ice on for 20 minutes, 2-3 times a day. Move your toes often to reduce stiffness and swelling. Raise (elevate) the injured area above the level of your heart while you are sitting   or lying down. Activity Do not use the injured foot to support your body weight until your health care provider says that you can. Use crutches as told by your health care provider. Do not do activities that make pain or swelling worse. Ask your health care provider when it is safe to drive if you have a cast, splint, or boot on your foot. Return to your normal activities as told by your health care provider. Ask your health care provider what activities are safe for you. Do exercises as  told by your health care provider. General instructions Take over-the-counter and prescription medicines only as told by your health care provider. If you have an orthotic, use it as told by your health care provider. Keep all follow-up visits as told by your health care provider. This is important. How is this prevented? Wear footwear that is appropriate to your athletic activity. Avoid athletic activities that cause pain or swelling in your ankle or foot. Before being active, do range-of-motion and stretching exercises. If you develop pain or swelling while training, stop training. If you have pain or swelling that does not improve after a few days of rest, see your health care provider. If you start a new athletic activity, start gradually so you can build up your strength and flexibility. Contact a health care provider if: Your symptoms get worse. Your symptoms do not improve in 6-8 weeks. You develop new, unexplained symptoms. Your splint, boot, or cast gets damaged. Summary Posterior tibial tendinitis is irritation of a tendon called the posterior tibial tendon. This condition is most often caused by repeated stress to the tendon (overuse injury). This condition causes foot pain and ankle pain. It can also lead to a flat foot. This condition may be treated by not doing high-impact activities, applying ice, having physical therapy, wearing orthotics, and wearing a cast, splint, or boot if needed. This information is not intended to replace advice given to you by your health care provider. Make sure you discuss any questions you have with your health care provider. Document Revised: 04/07/2019 Document Reviewed: 02/12/2019 Elsevier Patient Education  2020 [employer]  Posterior Tibial Tendinitis Rehab Ask your health care provider which exercises are safe for you. Do exercises exactly as told by your health care provider and adjust them as directed. It is normal to feel mild  stretching, pulling, tightness, or discomfort as you do these exercises. Stop right away if you feel sudden pain or your pain gets worse. Do not begin these exercises until told by your health care provider. Stretching and range-of-motion exercises These exercises warm up your muscles and joints and improve the movement and flexibility in your ankle and foot. These exercises may also help to relieve pain. Standing wall calf stretch, knee straight   Stand with your hands against a wall. Extend your left / right leg behind you, and bend your front knee slightly. If directed, place a folded washcloth under the arch of your foot for support. Point the toes of your back foot slightly inward. Keeping your heels on the floor and your back knee straight, shift your weight toward the wall. Do not allow your back to arch. You should feel a gentle stretch in your upper left / right calf. Hold this position for 10 seconds. Repeat 10 times. Complete this exercise 2 times a day. Standing wall calf stretch, knee bent Stand with your hands against a wall. Extend your left / right leg behind you, and bend your front   knee slightly. If directed, place a folded washcloth under the arch of your foot for support. Point the toes of your back foot slightly inward. Unlock your back knee so it is bent. Keep your heels on the floor. You should feel a gentle stretch deep in your lower left / right calf. Hold this position for 10 seconds. Repeat 10 times. Complete this exercise 2 times a day. Strengthening exercises These exercises build strength and endurance in your ankle and foot. Endurance is the ability to use your muscles for a long time, even after they get tired. Ankle inversion with band Secure one end of a rubber exercise band or tubing to a fixed object, such as a table leg or a pole, that will stay still when the band is pulled. Loop the other end of the band around the middle of your left / right foot. Sit  on the floor facing the object with your left / right leg extended. The band or tube should be slightly tense when your foot is relaxed. Leading with your big toe, slowly bring your left / right foot and ankle inward, toward your other foot (inversion). Hold this position for 10 seconds. Slowly return your foot to the starting position. Repeat 10 times. Complete this exercise 2 times a day. Towel curls   Sit in a chair on a non-carpeted surface, and put your feet on the floor. Place a towel in front of your feet. Keeping your heel on the floor, put your left / right foot on the towel. Pull the towel toward you by grabbing the towel with your toes and curling them under. Keep your heel on the floor while you do this. Let your toes relax. Grab the towel with your toes again. Keep going until the towel is completely underneath your foot. Repeat 10 times. Complete this exercise 2 times a day. Balance exercise This exercise improves or maintains your balance. Balance is important in preventing falls. Single leg stand Without wearing shoes, stand near a railing or in a doorway. You may hold on to the railing or door frame as needed for balance. Stand on your left / right foot. Keep your big toe down on the floor and try to keep your arch lifted. If balancing in this position is too easy, try the exercise with your eyes closed or while standing on a pillow. Hold this position for 10 seconds. Repeat 10 times. Complete this exercise 2 times a day. This information is not intended to replace advice given to you by your health care provider. Make sure you discuss any questions you have with your health care provider.  

## 2021-12-13 ENCOUNTER — Ambulatory Visit: Admit: 2021-12-13 | Discharge: 2021-12-14 | Payer: MEDICARE

## 2021-12-13 DIAGNOSIS — I10 Essential (primary) hypertension: Principal | ICD-10-CM

## 2021-12-13 DIAGNOSIS — M79671 Pain in right foot: Principal | ICD-10-CM

## 2021-12-13 DIAGNOSIS — I739 Peripheral vascular disease, unspecified: Principal | ICD-10-CM

## 2021-12-13 DIAGNOSIS — E119 Type 2 diabetes mellitus without complications: Principal | ICD-10-CM

## 2021-12-13 DIAGNOSIS — I2699 Other pulmonary embolism without acute cor pulmonale: Principal | ICD-10-CM

## 2021-12-13 MED ORDER — HYDROXYZINE HCL 25 MG TABLET
ORAL_TABLET | 5 refills | 0 days | Status: CP
Start: 2021-12-13 — End: ?

## 2021-12-13 MED ORDER — OMEPRAZOLE 40 MG CAPSULE,DELAYED RELEASE
ORAL_CAPSULE | Freq: Every day | ORAL | 3 refills | 90 days | Status: CP
Start: 2021-12-13 — End: ?

## 2021-12-21 ENCOUNTER — Other Ambulatory Visit: Payer: Self-pay

## 2021-12-21 ENCOUNTER — Ambulatory Visit (INDEPENDENT_AMBULATORY_CARE_PROVIDER_SITE_OTHER): Payer: Medicare Other | Admitting: Podiatry

## 2021-12-21 DIAGNOSIS — E119 Type 2 diabetes mellitus without complications: Secondary | ICD-10-CM | POA: Diagnosis not present

## 2021-12-21 DIAGNOSIS — R52 Pain, unspecified: Secondary | ICD-10-CM | POA: Diagnosis not present

## 2021-12-21 DIAGNOSIS — L84 Corns and callosities: Secondary | ICD-10-CM

## 2021-12-21 DIAGNOSIS — M2141 Flat foot [pes planus] (acquired), right foot: Secondary | ICD-10-CM

## 2021-12-21 DIAGNOSIS — Z7901 Long term (current) use of anticoagulants: Secondary | ICD-10-CM

## 2021-12-21 DIAGNOSIS — Z794 Long term (current) use of insulin: Secondary | ICD-10-CM | POA: Diagnosis not present

## 2021-12-21 DIAGNOSIS — M2142 Flat foot [pes planus] (acquired), left foot: Secondary | ICD-10-CM

## 2021-12-21 NOTE — Patient Instructions (Signed)
Aperture pads can be bought on [employer]  Look for urea 40% cream or ointment and apply to the thickened dry skin / calluses. This can be bought over the counter, at a pharmacy or online such as [employer].  Talk to your dermatologist about a stronger steroid cream. Use the clobetasol until then

## 2021-12-25 ENCOUNTER — Encounter: Payer: Self-pay | Admitting: Podiatry

## 2021-12-25 NOTE — Progress Notes (Signed)
°  Subjective:  Patient ID: Craig [name], male    DOB: 1961-05-17,  MRN: [id number]  Chief Complaint  Patient presents with   Foot Pain     knot on r foot ; sensitive; rubs on shoe when walking painful    61 y.o. male presents with the above complaint. History confirmed with patient.  Painful spot on the outside of the foot under the fifth toe is bothering him  Objective:  Physical Exam: warm, good capillary refill, no trophic changes or ulcerative lesions, normal DP and PT pulses, normal sensory exam, and right foot porokeratosis preulcerative callus under the fifth metatarsal head he has prominence of the fifth metatarsal and pes planus performing a bilateral. \ Assessment:   1. Callus of foot   2. Long term current use of anticoagulant therapy   3. Type 2 diabetes mellitus without complication, with long-term current use of insulin (HCC)   4. Pain   5. Pes planus of both feet      Plan:  Patient was evaluated and treated and all questions answered.  Patient educated on diabetes. Discussed proper diabetic foot care and discussed risks and complications of disease. Educated patient in depth on reasons to return to the office immediately should he/she discover anything concerning or new on the feet. All questions answered. Discussed proper shoes as well.   All symptomatic hyperkeratoses were safely debrided with a sterile #15 blade to patient's level of comfort without incident. We discussed preventative and palliative care of these lesions including supportive and accommodative shoegear, padding, prefabricated and custom molded accommodative orthoses, use of a pumice stone and lotions/creams daily.  I think this foot deformity and skin lesion that is developing he is at risk for ulceration of this area.  I recommended he be fitted for extra depth diabetic shoes with custom molded insoles to offload this lesion.  He will be scheduled with our pedorthist for this.  He will see me back  as needed for further debridement of the lesion.  Return if symptoms worsen or fail to improve.

## 2021-12-28 ENCOUNTER — Other Ambulatory Visit: Payer: Medicare Other

## 2021-12-29 MED ORDER — XARELTO 20 MG TABLET
ORAL_TABLET | 11 refills | 0.00000 days
Start: 2021-12-29 — End: ?

## 2022-01-01 ENCOUNTER — Other Ambulatory Visit: Payer: Medicare Other

## 2022-01-01 MED ORDER — XARELTO 20 MG TABLET
ORAL_TABLET | 11 refills | 0 days | Status: CP
Start: 2022-01-01 — End: ?

## 2022-01-02 ENCOUNTER — Other Ambulatory Visit: Payer: Self-pay

## 2022-01-02 ENCOUNTER — Other Ambulatory Visit: Payer: Commercial Managed Care - HMO

## 2022-01-02 DIAGNOSIS — B20 Human immunodeficiency virus [HIV] disease: Secondary | ICD-10-CM

## 2022-01-02 DIAGNOSIS — I2510 Atherosclerotic heart disease of native coronary artery without angina pectoris: Principal | ICD-10-CM

## 2022-01-02 MED ORDER — ATORVASTATIN 40 MG TABLET
ORAL_TABLET | Freq: Every day | ORAL | 3 refills | 90 days | Status: CP
Start: 2022-01-02 — End: 2023-01-02

## 2022-01-03 LAB — T-HELPER CELL (CD4) - (RCID CLINIC ONLY)
CD4 % Helper T Cell: 41 % (ref 33–65)
CD4 T Cell Abs: 576 /uL (ref 400–1790)

## 2022-01-04 LAB — CBC WITH DIFFERENTIAL/PLATELET
Absolute Monocytes: 390 cells/uL (ref 200–950)
Basophils Absolute: 18 cells/uL (ref 0–200)
Basophils Relative: 0.3 %
Eosinophils Absolute: 79 cells/uL (ref 15–500)
Eosinophils Relative: 1.3 %
HCT: 40.7 % (ref 38.5–50.0)
Hemoglobin: 12.9 g/dL — ABNORMAL LOW (ref 13.2–17.1)
Lymphs Abs: 1458 cells/uL (ref 850–3900)
MCH: 28.3 pg (ref 27.0–33.0)
MCHC: 31.7 g/dL — ABNORMAL LOW (ref 32.0–36.0)
MCV: 89.3 fL (ref 80.0–100.0)
MPV: 9.8 fL (ref 7.5–12.5)
Monocytes Relative: 6.4 %
Neutro Abs: 4154 cells/uL (ref 1500–7800)
Neutrophils Relative %: 68.1 %
Platelets: 313 10*3/uL (ref 140–400)
RBC: 4.56 10*6/uL (ref 4.20–5.80)
RDW: 13.9 % (ref 11.0–15.0)
Total Lymphocyte: 23.9 %
WBC: 6.1 10*3/uL (ref 3.8–10.8)

## 2022-01-04 LAB — COMPLETE METABOLIC PANEL WITH GFR
AG Ratio: 0.8 (calc) — ABNORMAL LOW (ref 1.0–2.5)
ALT: 11 U/L (ref 9–46)
AST: 21 U/L (ref 10–35)
Albumin: 3.9 g/dL (ref 3.6–5.1)
Alkaline phosphatase (APISO): 86 U/L (ref 35–144)
BUN: 9 mg/dL (ref 7–25)
CO2: 27 mmol/L (ref 20–32)
Calcium: 9.3 mg/dL (ref 8.6–10.3)
Chloride: 106 mmol/L (ref 98–110)
Creat: 1.16 mg/dL (ref 0.70–1.30)
Globulin: 5.1 g/dL (calc) — ABNORMAL HIGH (ref 1.9–3.7)
Glucose, Bld: 78 mg/dL (ref 65–99)
Potassium: 4.3 mmol/L (ref 3.5–5.3)
Sodium: 139 mmol/L (ref 135–146)
Total Bilirubin: 0.2 mg/dL (ref 0.2–1.2)
Total Protein: 9 g/dL — ABNORMAL HIGH (ref 6.1–8.1)
eGFR: 73 mL/min/{1.73_m2} (ref >=60)

## 2022-01-04 LAB — RPR: RPR Ser Ql: REACTIVE — AB

## 2022-01-04 LAB — HIV-1 RNA QUANT-NO REFLEX-BLD
HIV 1 RNA Quant: NOT DETECTED Copies/mL
HIV-1 RNA Quant, Log: NOT DETECTED Log cps/mL

## 2022-01-04 LAB — RPR TITER: RPR Titer: 1:4 {titer} — ABNORMAL HIGH

## 2022-01-04 LAB — FLUORESCENT TREPONEMAL AB(FTA)-IGG-BLD: Fluorescent Treponemal ABS: REACTIVE — AB

## 2022-01-17 ENCOUNTER — Encounter: Payer: Self-pay | Admitting: Infectious Disease

## 2022-01-17 ENCOUNTER — Ambulatory Visit (INDEPENDENT_AMBULATORY_CARE_PROVIDER_SITE_OTHER): Payer: Medicare Other | Admitting: Infectious Disease

## 2022-01-17 ENCOUNTER — Other Ambulatory Visit: Payer: Self-pay

## 2022-01-17 VITALS — BP 129/80 | HR 88 | Temp 97.8°F | Wt 225.0 lb

## 2022-01-17 DIAGNOSIS — B20 Human immunodeficiency virus [HIV] disease: Secondary | ICD-10-CM | POA: Diagnosis not present

## 2022-01-17 DIAGNOSIS — I5032 Chronic diastolic (congestive) heart failure: Secondary | ICD-10-CM

## 2022-01-17 DIAGNOSIS — Z23 Encounter for immunization: Secondary | ICD-10-CM

## 2022-01-17 DIAGNOSIS — I10 Essential (primary) hypertension: Secondary | ICD-10-CM | POA: Diagnosis not present

## 2022-01-17 DIAGNOSIS — I110 Hypertensive heart disease with heart failure: Secondary | ICD-10-CM | POA: Diagnosis not present

## 2022-01-17 DIAGNOSIS — R590 Localized enlarged lymph nodes: Secondary | ICD-10-CM

## 2022-01-17 HISTORY — DX: Localized enlarged lymph nodes: R59.0
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
## 2022-01-17 NOTE — Addendum Note (Signed)
Addended by: Adelfa Koh on: 01/17/2022 03:04 PM   Modules accepted: Orders

## 2022-02-13 ENCOUNTER — Other Ambulatory Visit: Payer: Self-pay

## 2022-02-13 ENCOUNTER — Ambulatory Visit (INDEPENDENT_AMBULATORY_CARE_PROVIDER_SITE_OTHER): Payer: Medicare Other | Admitting: Podiatry

## 2022-02-13 DIAGNOSIS — Z7901 Long term (current) use of anticoagulants: Secondary | ICD-10-CM | POA: Diagnosis not present

## 2022-02-13 DIAGNOSIS — M79675 Pain in left toe(s): Secondary | ICD-10-CM | POA: Diagnosis not present

## 2022-02-13 DIAGNOSIS — L84 Corns and callosities: Secondary | ICD-10-CM | POA: Diagnosis not present

## 2022-02-13 DIAGNOSIS — B351 Tinea unguium: Secondary | ICD-10-CM | POA: Diagnosis not present

## 2022-02-13 DIAGNOSIS — E119 Type 2 diabetes mellitus without complications: Secondary | ICD-10-CM | POA: Diagnosis not present

## 2022-02-13 DIAGNOSIS — M79674 Pain in right toe(s): Secondary | ICD-10-CM

## 2022-02-13 DIAGNOSIS — Z794 Long term (current) use of insulin: Secondary | ICD-10-CM

## 2022-02-13 NOTE — Patient Instructions (Signed)
U-shaped or horseshoe callus pads can be bought on [employer]

## 2022-02-19 ENCOUNTER — Encounter: Payer: Self-pay | Admitting: Podiatry

## 2022-02-19 NOTE — Progress Notes (Signed)
°  Subjective:  Patient ID: Craig [name], male    DOB: Apr 01, 1961,  MRN: [id number]  Chief Complaint  Patient presents with   Callouses    Right foot- requesting debridement of callus   Nail Problem    Daibetic foot exam nail trim 1-5 bilat     61 y.o. male presents with the above complaint. History confirmed with patient.  Painful spot on the outside of the foot under the fifth toe is bothering him  Objective:  Physical Exam: warm, good capillary refill, no trophic changes or ulcerative lesions, normal DP and PT pulses, normal sensory exam, and right foot porokeratosis preulcerative callus under the fifth metatarsal head he has prominence of the fifth metatarsal and pes planus performing a bilateral.  Thickened and elongated brown discolored nails \ Assessment:   1. Callus of foot   2. Type 2 diabetes mellitus without complication, with long-term current use of insulin (HCC)   3. Long term current use of anticoagulant therapy   4. Pain due to onychomycosis of toenails of both feet      Plan:  Patient was evaluated and treated and all questions answered.  Patient educated on diabetes. Discussed proper diabetic foot care and discussed risks and complications of disease. Educated patient in depth on reasons to return to the office immediately should he/she discover anything concerning or new on the feet. All questions answered. Discussed proper shoes as well.   All symptomatic hyperkeratoses were safely debrided with a sterile #15 blade to patient's level of comfort without incident. We discussed preventative and palliative care of these lesions including supportive and accommodative shoegear, padding, prefabricated and custom molded accommodative orthoses, use of a pumice stone and lotions/creams daily.     Return in about 3 months (around 05/13/2022) for at risk diabetic foot care.

## 2022-02-26 ENCOUNTER — Ambulatory Visit: Admit: 2022-02-26 | Discharge: 2022-02-27 | Payer: MEDICARE

## 2022-02-26 DIAGNOSIS — L209 Atopic dermatitis, unspecified: Principal | ICD-10-CM

## 2022-02-26 DIAGNOSIS — L2084 Intrinsic (allergic) eczema: Principal | ICD-10-CM

## 2022-02-26 MED ORDER — DUPIXENT 300 MG/2 ML SUBCUTANEOUS SYRINGE
SUBCUTANEOUS | 3 refills | 28 days | Status: CP
Start: 2022-02-26 — End: ?

## 2022-02-26 MED ORDER — TRIAMCINOLONE ACETONIDE 0.1 % TOPICAL CREAM
Freq: Two times a day (BID) | TOPICAL | 3 refills | 0 days | Status: CP
Start: 2022-02-26 — End: 2023-02-26

## 2022-02-26 MED ORDER — HYDROXYZINE HCL 25 MG TABLET
ORAL_TABLET | Freq: Two times a day (BID) | ORAL | 6 refills | 30 days | Status: CP
Start: 2022-02-26 — End: ?

## 2022-02-26 MED ORDER — HYDROCORTISONE 2.5 % TOPICAL CREAM
Freq: Two times a day (BID) | TOPICAL | 3 refills | 0 days | Status: CP
Start: 2022-02-26 — End: 2023-02-26

## 2022-03-13 ENCOUNTER — Ambulatory Visit (INDEPENDENT_AMBULATORY_CARE_PROVIDER_SITE_OTHER): Payer: Medicare Other | Admitting: Podiatry

## 2022-03-13 ENCOUNTER — Encounter: Payer: Self-pay | Admitting: Podiatry

## 2022-03-13 ENCOUNTER — Other Ambulatory Visit: Payer: Self-pay

## 2022-03-13 ENCOUNTER — Ambulatory Visit (INDEPENDENT_AMBULATORY_CARE_PROVIDER_SITE_OTHER): Payer: Medicare Other

## 2022-03-13 DIAGNOSIS — M7672 Peroneal tendinitis, left leg: Secondary | ICD-10-CM

## 2022-03-13 DIAGNOSIS — M79672 Pain in left foot: Secondary | ICD-10-CM

## 2022-03-13 MED ORDER — METHYLPREDNISOLONE 4 MG PO TBPK
ORAL_TABLET | ORAL | 0 refills | Status: DC
Start: 2022-03-13 — End: 2023-02-02

## 2022-03-13 NOTE — Patient Instructions (Signed)

## 2022-03-16 NOTE — Progress Notes (Signed)
?  Subjective:  ?Patient ID: Craig [name], male    DOB: 1961-11-27,  MRN: [id number] ? ?Chief Complaint  ?Patient presents with  ? Foot Pain  ?  Patient reports left foot pain, onset about 3 weeks ago to the lateral aspect of the foot. Area is sore and tender to touch.   ? ? ?61 y.o. male presents with the above complaint. History confirmed with patient.  He is having pain on the outside of the left foot now feels like a bone is sticking out ? ?Objective:  ?Physical Exam: ?warm, good capillary refill, no trophic changes or ulcerative lesions, normal DP and PT pulses, normal sensory exam, and left foot there is pain on palpation of the insertion of the peroneus brevis at the base of the fifth metatarsal ?\ ? ?New radiographs taken today show no acute osseous abnormalities he does have prominent base of fifth met no fracture noted ?Assessment:  ? ?1. Left foot pain   ? ? ? ?Plan:  ?Patient was evaluated and treated and all questions answered. ? ?Discussed the etiology and treatment options for peroneal tendinitis including stretching, formal physical therapy with an eccentric exercises therapy plan, supportive shoegears such as a running shoe or sneaker, heel lifts, topical and oral medications.  We also discussed that I do not routinely perform injections in this area because of the risk of an increased damage or rupture of the tendon.  We also discussed the role of surgical treatment of this for patients who do not improve after exhausting non-surgical treatment options. ? ?-XR reviewed with patient ?-Educated on stretching and icing of the affected limb. ?-Rx for Medrol 6-day taper. Advised on risks, benefits, and alternatives of the medication ?-Advised to watch his blood sugar, and currently his A1c is very good ?-Home therapy plan was given I will see him back in 6 weeks ? ? ? ?Return in about 6 weeks (around 04/24/2022) for re-check peroneal tendinitis.  ? ?

## 2022-03-24 ENCOUNTER — Other Ambulatory Visit: Payer: Self-pay | Admitting: Podiatry

## 2022-03-24 DIAGNOSIS — M7672 Peroneal tendinitis, left leg: Secondary | ICD-10-CM

## 2022-04-16 ENCOUNTER — Ambulatory Visit: Admit: 2022-04-16 | Discharge: 2022-04-17 | Payer: MEDICARE

## 2022-04-16 DIAGNOSIS — E119 Type 2 diabetes mellitus without complications: Principal | ICD-10-CM

## 2022-04-16 DIAGNOSIS — I739 Peripheral vascular disease, unspecified: Principal | ICD-10-CM

## 2022-04-24 ENCOUNTER — Encounter: Payer: Self-pay | Admitting: Podiatry

## 2022-04-24 ENCOUNTER — Ambulatory Visit (INDEPENDENT_AMBULATORY_CARE_PROVIDER_SITE_OTHER): Payer: Medicare Other | Admitting: Podiatry

## 2022-04-24 ENCOUNTER — Ambulatory Visit: Payer: Medicare Other

## 2022-04-24 DIAGNOSIS — Z7901 Long term (current) use of anticoagulants: Secondary | ICD-10-CM | POA: Diagnosis not present

## 2022-04-24 DIAGNOSIS — E119 Type 2 diabetes mellitus without complications: Secondary | ICD-10-CM

## 2022-04-24 DIAGNOSIS — Z794 Long term (current) use of insulin: Secondary | ICD-10-CM

## 2022-04-24 DIAGNOSIS — M2141 Flat foot [pes planus] (acquired), right foot: Secondary | ICD-10-CM

## 2022-04-24 DIAGNOSIS — L84 Corns and callosities: Secondary | ICD-10-CM

## 2022-04-24 DIAGNOSIS — M2142 Flat foot [pes planus] (acquired), left foot: Secondary | ICD-10-CM

## 2022-04-24 NOTE — Progress Notes (Signed)
SITUATION ?Reason for Consult: Evaluation for Prefabricated Diabetic Shoes and Custom Diabetic Inserts. ?Patient / Caregiver Report: Patient would like well fitting shoes ? ?OBJECTIVE DATA: ?Patient History / Diagnosis:  ?  ICD-10-CM   ?1. Type 2 diabetes mellitus without complication, with long-term current use of insulin ([facility])  E11.9   ? Z79.4   ?  ?2. Pes planus of both feet  M21.41   ? M21.42   ?  ? ? ?Physician Treating Diabetes:  [facility] - patient does not know who supervises Darius Bump PA ? ?Current or Previous Devices:   None and no history ? ?In-Person Foot Examination: ?Ulcers & Callousing:   Historical ?Deformities:    Pes planus ?Sensation:    Compromised  ?Shoe Size:     13W ? ?ORTHOTIC RECOMMENDATION ?Recommended Devices: ?- 1x pair prefabricated PDAC approved diabetic shoes; Patient Selected Apex X801M Size 13W ?- 3x pair custom-to-patient PDAC approved vacuum formed diabetic insoles. ? ?GOALS OF SHOES AND INSOLES ?- Reduce shear and pressure ?- Reduce / Prevent callus formation ?- Reduce / Prevent ulceration ?- Protect the fragile healing compromised diabetic foot. ? ?Patient would benefit from diabetic shoes and inserts as patient has diabetes mellitus and the patient has one or more of the following conditions: ?- History of partial or complete amputation of the foot ?- History of previous foot ulceration. ?- History of pre-ulcerative callus ?- Peripheral neuropathy with evidence of callus formation ?- Foot deformity ?- Poor circulation ? ?ACTIONS PERFORMED ?Potential out of pocket cost was communicated to patient. Patient understood and consented to measurement and casting. Patient was casted for insoles via crush box and measured for shoes via brannock device. Procedure was explained and patient tolerated procedure well. All questions were answered and concerns addressed. Casts were shipped to central fabrication for HOLD until Certificate of Medical Necessity or otherwise necessary  authorization from insurance is obtained. ? ?PLAN ?Shoes are to be ordered and casts released from hold once all appropriate paperwork is complete. Patient is to be contacted and scheduled for fitting once shoes and insoles have been fabricated and received. ? ?

## 2022-04-29 NOTE — Progress Notes (Signed)
?  Subjective:  ?Patient ID: Craig [name], male    DOB: Jun 23, 1961,  MRN: [id number] ? ?Chief Complaint  ?Patient presents with  ? Foot Pain  ?  Follow up peroneal tendonitis left   "The left foot is better, now it back in the right one" (patient points to callused area 5th met base right)   ? ? ?61 y.o. male presents with the above complaint. History confirmed with patient.  The left foot is doing much better the right foot is now painful again ? ?Objective:  ?Physical Exam: ?warm, good capillary refill, no trophic changes or ulcerative lesions, normal DP and PT pulses, normal sensory exam, and left foot no pain at fifth met base, right foot has a porokeratosis that is painful ?\ ? ?New radiographs taken today show no acute osseous abnormalities he does have prominent base of fifth met no fracture noted ?Assessment:  ? ?1. Callus of foot   ?2. Type 2 diabetes mellitus without complication, with long-term current use of insulin ([place])   ?3. Long term current use of anticoagulant therapy   ?4. Pes planus of both feet   ? ? ? ?Plan:  ?Patient was evaluated and treated and all questions answered. ? ? ?Patient educated on diabetes. Discussed proper diabetic foot care and discussed risks and complications of disease. Educated patient in depth on reasons to return to the office immediately should he/she discover anything concerning or new on the feet. All questions answered. Discussed proper shoes as well.  He was fitted for extra-depth diabetic shoes and I think this will offload the areas in question that are causing quite a bit of pain ? ?All symptomatic hyperkeratoses were safely debrided with a sterile #15 blade to patient's level of comfort without incident. We discussed preventative and palliative care of these lesions including supportive and accommodative shoegear, padding, prefabricated and custom molded accommodative orthoses, use of a pumice stone and lotions/creams daily. ? ? ? ?No follow-ups on file.  ? ?

## 2022-05-11 ENCOUNTER — Emergency Department
Admit: 2022-05-11 | Discharge: 2022-05-11 | Disposition: A | Discharge status: Home / Self Care | Payer: MEDICARE | Attending: Family Medicine

## 2022-05-11 ENCOUNTER — Ambulatory Visit
Admit: 2022-05-11 | Discharge: 2022-05-11 | Disposition: A | Discharge status: Home / Self Care | Payer: MEDICARE | Attending: Family Medicine

## 2022-05-15 ENCOUNTER — Ambulatory Visit (INDEPENDENT_AMBULATORY_CARE_PROVIDER_SITE_OTHER): Payer: Medicare Other | Admitting: Podiatry

## 2022-05-15 DIAGNOSIS — M79675 Pain in left toe(s): Secondary | ICD-10-CM | POA: Diagnosis not present

## 2022-05-15 DIAGNOSIS — B351 Tinea unguium: Secondary | ICD-10-CM

## 2022-05-15 DIAGNOSIS — I872 Venous insufficiency (chronic) (peripheral): Secondary | ICD-10-CM

## 2022-05-15 DIAGNOSIS — M79674 Pain in right toe(s): Secondary | ICD-10-CM

## 2022-05-15 MED ORDER — BETAMETHASONE DIPROPIONATE AUG 0.05 % EX OINT: TOPICAL_OINTMENT | Freq: Two times a day (BID) | CUTANEOUS | 0 refills | Status: DC

## 2022-05-17 ENCOUNTER — Encounter: Payer: Self-pay | Admitting: Podiatry

## 2022-05-17 NOTE — Progress Notes (Signed)
  Subjective:  Patient ID: Craig [name], male    DOB: May 07, 1961,  MRN: [id number]  Chief Complaint  Patient presents with   Nail Problem    Thick painful toenails, 3 month follow up    Callouses    61 y.o. male presents with the above complaint. History confirmed with patient.  Nails are thickened and elongated again, has not shoes yet.  Has a dry itching rash on top of foot  Objective:  Physical Exam: warm, good capillary refill, no trophic changes or ulcerative lesions, normal DP and PT pulses, normal sensory exam, and he has thickened elongated brown-yellow discolored nails with subungual debris.  Venous insufficiency with hemosiderosis and dermatitis on the dorsal foot and gaiter area of the leg Assessment:   1. Venous stasis dermatitis of both lower extremities   2. Pain due to onychomycosis of toenails of both feet       Plan:  Patient was evaluated and treated and all questions answered.   Patient educated on diabetes. Discussed proper diabetic foot care and discussed risks and complications of disease. Educated patient in depth on reasons to return to the office immediately should he/she discover anything concerning or new on the feet. All questions answered. Discussed proper shoes as well.  He was fitted for extra-depth diabetic shoes and I think this will offload the areas in question that are causing quite a bit of pain  Discussed the etiology and treatment options for the condition in detail with the patient. Educated patient on the topical and oral treatment options for mycotic nails. Recommended debridement of the nails today. Sharp and mechanical debridement performed of all painful and mycotic nails today. Nails debrided in length and thickness using a nail nipper to level of comfort. Discussed treatment options including appropriate shoe gear. Follow up as needed for painful nails.   Discussed the rash that he has and how this is related to his venous insufficiency.   I recommended compression stockings and elevate the leg when off of them.  Prescribed betamethasone ointment to use twice daily until it resolves.  Return in about 3 months (around 08/15/2022) for at risk diabetic foot care.
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
## 2022-06-21 ENCOUNTER — Ambulatory Visit (INDEPENDENT_AMBULATORY_CARE_PROVIDER_SITE_OTHER): Payer: Medicare Other | Admitting: Podiatry

## 2022-06-21 DIAGNOSIS — Z7901 Long term (current) use of anticoagulants: Secondary | ICD-10-CM | POA: Diagnosis not present

## 2022-06-21 DIAGNOSIS — Z794 Long term (current) use of insulin: Secondary | ICD-10-CM | POA: Diagnosis not present

## 2022-06-21 DIAGNOSIS — L84 Corns and callosities: Secondary | ICD-10-CM

## 2022-06-21 DIAGNOSIS — E119 Type 2 diabetes mellitus without complications: Secondary | ICD-10-CM

## 2022-06-21 DIAGNOSIS — R52 Pain, unspecified: Secondary | ICD-10-CM

## 2022-06-21 NOTE — Progress Notes (Signed)
  Subjective:  Patient ID: Craig [name], male    DOB: 08/19/1961,  MRN: [id number]  Chief Complaint  Patient presents with   Callouses        Callus removal    61 y.o. male presents with the above complaint. History confirmed with patient.  Right foot calluses painful again  Objective:  Physical Exam: warm, good capillary refill, no trophic changes or ulcerative lesions, normal DP and PT pulses, normal sensory exam, and left foot no pain at fifth met base, right foot has a porokeratosis that is painful \  New radiographs taken today show no acute osseous abnormalities he does have prominent base of fifth met no fracture noted Assessment:   1. Callus of foot   2. Pain   3. Type 2 diabetes mellitus without complication, with long-term current use of insulin ([place])   4. Long term current use of anticoagulant therapy      Plan:  Patient was evaluated and treated and all questions answered.   Recommended urea with salicylic acid cream  All symptomatic hyperkeratoses were safely debrided with a sterile #15 blade to patient's level of comfort without incident. We discussed preventative and palliative care of these lesions including supportive and accommodative shoegear, padding, prefabricated and custom molded accommodative orthoses, use of a pumice stone and lotions/creams daily.    No follow-ups on file.

## 2022-06-21 NOTE — Patient Instructions (Signed)
Look for urea 40% with salicylic acid 2% cream or ointment and apply to the thickened dry skin / calluses. This can be bought over the counter, at a pharmacy or online such as [employer]. Foam or felt aperture pads can also be done

## 2022-07-03 MED ORDER — JARDIANCE 25 MG TABLET
ORAL_TABLET | 3 refills | 0 days
Start: 2022-07-03 — End: ?

## 2022-07-04 MED ORDER — JARDIANCE 25 MG TABLET
ORAL_TABLET | 3 refills | 0 days | Status: CP
Start: 2022-07-04 — End: ?

## 2022-07-09 ENCOUNTER — Other Ambulatory Visit: Payer: Medicare Other

## 2022-07-09 ENCOUNTER — Other Ambulatory Visit: Payer: Self-pay

## 2022-07-09 DIAGNOSIS — B20 Human immunodeficiency virus [HIV] disease: Secondary | ICD-10-CM

## 2022-07-10 ENCOUNTER — Telehealth: Payer: Self-pay

## 2022-07-10 LAB — T-HELPER CELL (CD4) - (RCID CLINIC ONLY)
CD4 % Helper T Cell: 41 % (ref 33–65)
CD4 T Cell Abs: 556 /uL (ref 400–1790)

## 2022-07-10 NOTE — Telephone Encounter (Signed)
-----   Message from Randall Hiss, MD sent at 07/10/2022 11:26 AM EDT ----- Creatinine up to 2 now, could he be dehydrated?  ----- Message ----- From: Janace Hoard Lab Results In Sent: 07/09/2022  10:54 PM EDT To: Randall Hiss, MD

## 2022-07-10 NOTE — Telephone Encounter (Signed)
Called patient to discuss results, no answer. Left HIPAA compliant voicemail requesting callback.   Routed elevated creatinine lab to PCP, [facility].   Sandie Ano, RN

## 2022-07-11 LAB — CBC WITH DIFFERENTIAL/PLATELET
Absolute Monocytes: 490 cells/uL (ref 200–950)
Basophils Absolute: 31 cells/uL (ref 0–200)
Basophils Relative: 0.5 %
Eosinophils Absolute: 353 cells/uL (ref 15–500)
Eosinophils Relative: 5.7 %
HCT: 37.5 % — ABNORMAL LOW (ref 38.5–50.0)
Hemoglobin: 12.5 g/dL — ABNORMAL LOW (ref 13.2–17.1)
Lymphs Abs: 1593 cells/uL (ref 850–3900)
MCH: 27.8 pg (ref 27.0–33.0)
MCHC: 33.3 g/dL (ref 32.0–36.0)
MCV: 83.3 fL (ref 80.0–100.0)
MPV: 9.9 fL (ref 7.5–12.5)
Monocytes Relative: 7.9 %
Neutro Abs: 3732 cells/uL (ref 1500–7800)
Neutrophils Relative %: 60.2 %
Platelets: 344 10*3/uL (ref 140–400)
RBC: 4.5 10*6/uL (ref 4.20–5.80)
RDW: 14.5 % (ref 11.0–15.0)
Total Lymphocyte: 25.7 %
WBC: 6.2 10*3/uL (ref 3.8–10.8)

## 2022-07-11 LAB — LIPID PANEL
Cholesterol: 76 mg/dL (ref <200)
HDL: 32 mg/dL — ABNORMAL LOW (ref >=40)
LDL Cholesterol (Calc): 28 mg/dL (calc)
Non-HDL Cholesterol (Calc): 44 mg/dL (calc) (ref <130)
Total CHOL/HDL Ratio: 2.4 (calc) (ref <5.0)
Triglycerides: 75 mg/dL (ref <150)

## 2022-07-11 LAB — HIV-1 RNA QUANT-NO REFLEX-BLD
HIV 1 RNA Quant: NOT DETECTED Copies/mL
HIV-1 RNA Quant, Log: NOT DETECTED Log cps/mL

## 2022-07-11 LAB — COMPLETE METABOLIC PANEL WITH GFR
AG Ratio: 0.7 (calc) — ABNORMAL LOW (ref 1.0–2.5)
ALT: 14 U/L (ref 9–46)
AST: 28 U/L (ref 10–35)
Albumin: 3.7 g/dL (ref 3.6–5.1)
Alkaline phosphatase (APISO): 101 U/L (ref 35–144)
BUN/Creatinine Ratio: 8 (calc) (ref 6–22)
BUN: 17 mg/dL (ref 7–25)
CO2: 24 mmol/L (ref 20–32)
Calcium: 9.2 mg/dL (ref 8.6–10.3)
Chloride: 107 mmol/L (ref 98–110)
Creat: 2.07 mg/dL — ABNORMAL HIGH (ref 0.70–1.30)
Globulin: 5.5 g/dL (calc) — ABNORMAL HIGH (ref 1.9–3.7)
Glucose, Bld: 87 mg/dL (ref 65–99)
Potassium: 5.1 mmol/L (ref 3.5–5.3)
Sodium: 137 mmol/L (ref 135–146)
Total Bilirubin: 0.4 mg/dL (ref 0.2–1.2)
Total Protein: 9.2 g/dL — ABNORMAL HIGH (ref 6.1–8.1)
eGFR: 36 mL/min/{1.73_m2} — ABNORMAL LOW (ref >=60)

## 2022-07-11 LAB — RPR TITER: RPR Titer: 1:2 {titer} — ABNORMAL HIGH

## 2022-07-11 LAB — RPR: RPR Ser Ql: REACTIVE — AB

## 2022-07-11 LAB — FLUORESCENT TREPONEMAL AB(FTA)-IGG-BLD: Fluorescent Treponemal ABS: REACTIVE — AB

## 2022-07-11 NOTE — Telephone Encounter (Signed)
Second attempt to reach patient, no answer. Left HIPAA compliant voicemail requesting callback.   Dianne Whelchel D Bilal Manzer, RN  

## 2022-07-12 NOTE — Telephone Encounter (Signed)
Third attempt: Left HIPPA compliant vm to call [facility].

## 2022-07-16 ENCOUNTER — Telehealth: Payer: Self-pay | Admitting: Podiatry

## 2022-07-16 NOTE — Telephone Encounter (Signed)
Pt called about his appt and then was asking about his diabetic shoes status as it has been about 2 months. Please let pt know status.

## 2022-07-23 ENCOUNTER — Ambulatory Visit (INDEPENDENT_AMBULATORY_CARE_PROVIDER_SITE_OTHER): Payer: Medicare Other | Admitting: Infectious Disease

## 2022-07-23 ENCOUNTER — Telehealth: Payer: Self-pay

## 2022-07-23 ENCOUNTER — Encounter: Payer: Self-pay | Admitting: Infectious Disease

## 2022-07-23 ENCOUNTER — Other Ambulatory Visit: Payer: Self-pay

## 2022-07-23 VITALS — BP 105/69 | HR 104 | Temp 97.9°F | Ht 72.0 in | Wt 220.0 lb

## 2022-07-23 DIAGNOSIS — J45909 Unspecified asthma, uncomplicated: Secondary | ICD-10-CM

## 2022-07-23 DIAGNOSIS — I2510 Atherosclerotic heart disease of native coronary artery without angina pectoris: Secondary | ICD-10-CM

## 2022-07-23 DIAGNOSIS — M719 Bursopathy, unspecified: Secondary | ICD-10-CM | POA: Insufficient documentation

## 2022-07-23 DIAGNOSIS — E119 Type 2 diabetes mellitus without complications: Secondary | ICD-10-CM

## 2022-07-23 DIAGNOSIS — M7021 Olecranon bursitis, right elbow: Secondary | ICD-10-CM | POA: Diagnosis not present

## 2022-07-23 DIAGNOSIS — R590 Localized enlarged lymph nodes: Secondary | ICD-10-CM

## 2022-07-23 DIAGNOSIS — B20 Human immunodeficiency virus [HIV] disease: Secondary | ICD-10-CM | POA: Diagnosis not present

## 2022-07-23 DIAGNOSIS — I10 Essential (primary) hypertension: Secondary | ICD-10-CM

## 2022-07-23 DIAGNOSIS — Z794 Long term (current) use of insulin: Secondary | ICD-10-CM

## 2022-07-23 HISTORY — DX: Bursopathy, unspecified: M71.9

## 2022-07-23 MED ORDER — BIKTARVY 50-200-25 MG PO TABS: 1.0000 | ORAL_TABLET | Freq: Every day | ORAL | 11 refills | Status: DC

## 2022-07-23 MED ORDER — ATORVASTATIN CALCIUM 40 MG PO TABS
40.0000 mg | ORAL_TABLET | Freq: Every day | ORAL | 11 refills | Status: DC
Start: 2022-07-23 — End: 2023-01-13
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
## 2022-07-23 NOTE — Telephone Encounter (Signed)
Called patient to see if he would be able to make it to today's appointment, no answer. Left HIPAA compliant voicemail requesting callback.   Kimberl Vig D Jarquis Walker, RN  

## 2022-07-26 ENCOUNTER — Other Ambulatory Visit: Payer: Self-pay | Admitting: Infectious Disease

## 2022-07-26 ENCOUNTER — Ambulatory Visit (HOSPITAL_COMMUNITY)
Admission: RE | Admit: 2022-07-26 | Discharge: 2022-07-26 | Disposition: A | Discharge status: Home / Self Care | Payer: Medicare Other | Source: Ambulatory Visit | Attending: Infectious Disease | Admitting: Infectious Disease

## 2022-07-26 DIAGNOSIS — R590 Localized enlarged lymph nodes: Secondary | ICD-10-CM | POA: Diagnosis present

## 2022-07-26 DIAGNOSIS — R221 Localized swelling, mass and lump, neck: Secondary | ICD-10-CM

## 2022-07-27 ENCOUNTER — Telehealth: Payer: Self-pay

## 2022-07-27 ENCOUNTER — Other Ambulatory Visit: Payer: Self-pay

## 2022-07-27 DIAGNOSIS — R221 Localized swelling, mass and lump, neck: Secondary | ICD-10-CM

## 2022-07-27 NOTE — Telephone Encounter (Signed)
-----   Message from Randall Hiss, MD sent at 07/26/2022  7:07 PM EDT ----- Can we get Craig [name] referred to ENT for evaluation and biopsy? ----- Message ----- From: Interface, Rad Results In Sent: 07/26/2022   6:50 PM EDT To: Randall Hiss, MD

## 2022-07-27 NOTE — Addendum Note (Signed)
Addended by: Linna Hoff D on: 07/27/2022 09:05 AM   Modules accepted: Orders

## 2022-07-27 NOTE — Telephone Encounter (Signed)
If patient calls back, please notify him that ENT referral has been placed to further evaluate and biopsy findings from neck CT.   Sandie Ano, RN

## 2022-07-27 NOTE — Telephone Encounter (Signed)
Called patient to notify him that referral to ENT has been placed, no answer. Left HIPAA compliant voicemail requesting callback.   Sandie Ano, RN

## 2022-07-30 NOTE — Telephone Encounter (Signed)
Called patient to notify him of referral, no answer. Message left with previous attempt.   Sandie Ano, RN

## 2022-08-02 ENCOUNTER — Ambulatory Visit (INDEPENDENT_AMBULATORY_CARE_PROVIDER_SITE_OTHER): Payer: Medicare Other | Admitting: Orthopedic Surgery

## 2022-08-02 ENCOUNTER — Encounter: Payer: Self-pay | Admitting: Orthopedic Surgery

## 2022-08-02 DIAGNOSIS — M7021 Olecranon bursitis, right elbow: Secondary | ICD-10-CM

## 2022-08-02 DIAGNOSIS — M6701 Short Achilles tendon (acquired), right ankle: Secondary | ICD-10-CM | POA: Diagnosis not present
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
## 2022-08-16 ENCOUNTER — Ambulatory Visit: Payer: Medicare Other | Admitting: Podiatry

## 2022-08-28 ENCOUNTER — Ambulatory Visit (INDEPENDENT_AMBULATORY_CARE_PROVIDER_SITE_OTHER): Payer: Medicare Other | Admitting: Podiatry

## 2022-08-28 DIAGNOSIS — B351 Tinea unguium: Secondary | ICD-10-CM | POA: Diagnosis not present

## 2022-08-28 DIAGNOSIS — Z794 Long term (current) use of insulin: Secondary | ICD-10-CM | POA: Diagnosis not present

## 2022-08-28 DIAGNOSIS — I872 Venous insufficiency (chronic) (peripheral): Secondary | ICD-10-CM

## 2022-08-28 DIAGNOSIS — M79674 Pain in right toe(s): Secondary | ICD-10-CM | POA: Diagnosis not present

## 2022-08-28 DIAGNOSIS — E119 Type 2 diabetes mellitus without complications: Secondary | ICD-10-CM

## 2022-08-28 DIAGNOSIS — Z7901 Long term (current) use of anticoagulants: Secondary | ICD-10-CM

## 2022-08-28 DIAGNOSIS — L84 Corns and callosities: Secondary | ICD-10-CM | POA: Diagnosis not present

## 2022-08-28 DIAGNOSIS — M79675 Pain in left toe(s): Secondary | ICD-10-CM

## 2022-08-28 MED ORDER — BETAMETHASONE DIPROPIONATE 0.05 % EX CREA: TOPICAL_CREAM | Freq: Two times a day (BID) | CUTANEOUS | 1 refills | Status: AC

## 2022-08-29 NOTE — Progress Notes (Signed)
  Subjective:  Patient ID: Craig [name], male    DOB: Apr 14, 1961,  MRN: [id number]  Nails thickened elongated painful  Painful corn on right foot  61 y.o. male presents with the above complaint. History confirmed with patient.  Right foot calluse is painful again.  Nails are thickened and elongated and bothering him.  He also has discoloration rash on both legs and feet and swelling  Objective:  Physical Exam: warm, good capillary refill, no trophic changes or ulcerative lesions, normal DP and PT pulses, normal sensory exam, and left foot no pain at fifth met base, right foot has a porokeratosis that is painful at the fifth metatarsal base.  Nails are thickened elongated yellow and discolored with subungual debris, he has +1 peripheral edema with venous stasis dermatitis throughout the lower extremities   New radiographs taken today show no acute osseous abnormalities he does have prominent base of fifth met no fracture noted Assessment:   1. Callus of foot   2. Long term current use of anticoagulant therapy   3. Type 2 diabetes mellitus without complication, with long-term current use of insulin (HCC)   4. Pain due to onychomycosis of toenails of both feet   5. Venous stasis dermatitis of both lower extremities      Plan:  Patient was evaluated and treated and all questions answered.   Recommended urea with salicylic acid cream he should continue to use this  All symptomatic hyperkeratoses were safely debrided with a sterile #15 blade to patient's level of comfort without incident. We discussed preventative and palliative care of these lesions including supportive and accommodative shoegear, padding, prefabricated and custom molded accommodative orthoses, use of a pumice stone and lotions/creams daily.   Discussed the etiology and treatment options for the condition in detail with the patient. Educated patient on the topical and oral treatment options for mycotic nails.  Recommended debridement of the nails today. Sharp and mechanical debridement performed of all painful and mycotic nails today. Nails debrided in length and thickness using a nail nipper to level of comfort. Discussed treatment options including appropriate shoe gear. Follow up as needed for painful nails.   We discussed that the skin changes he is seeing is most likely venous stasis dermatitis.  I recommended topical corticosteroid treatment.  Betamethasone cream Rx sent to pharmacy.  Discussed how this relates to venous insufficiency.  Discussed compression stockings and elevation of the legs.  He is interested in a vein surgery consult.  Referral was sent to vein and vascular surgery  No follow-ups on file.
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
## 2022-09-26 ENCOUNTER — Ambulatory Visit: Payer: Medicare Other | Attending: Nurse Practitioner | Admitting: Nurse Practitioner

## 2022-09-26 ENCOUNTER — Encounter: Payer: Self-pay | Admitting: Nurse Practitioner

## 2022-09-26 VITALS — BP 118/68 | HR 101 | Ht 72.0 in | Wt 220.2 lb

## 2022-09-26 DIAGNOSIS — I10 Essential (primary) hypertension: Secondary | ICD-10-CM | POA: Diagnosis not present

## 2022-09-26 DIAGNOSIS — I5032 Chronic diastolic (congestive) heart failure: Secondary | ICD-10-CM

## 2022-09-26 DIAGNOSIS — E785 Hyperlipidemia, unspecified: Secondary | ICD-10-CM

## 2022-09-26 DIAGNOSIS — Z86711 Personal history of pulmonary embolism: Secondary | ICD-10-CM

## 2022-09-26 DIAGNOSIS — I2510 Atherosclerotic heart disease of native coronary artery without angina pectoris: Secondary | ICD-10-CM | POA: Diagnosis not present

## 2022-09-26 MED ORDER — METOPROLOL SUCCINATE ER 25 MG PO TB24: 37.5000 mg | ORAL_TABLET | Freq: Every day | ORAL | 3 refills | Status: DC

## 2022-09-26 NOTE — Patient Instructions (Addendum)
Medication Instructions:  Increase Toprol XL to 37.5 mg daily   *If you need a refill on your cardiac medications before your next appointment, please call your pharmacy*   Lab Work: BMET today   If you have labs (blood work) drawn today and your tests are completely normal, you will receive your results only by: [place] (if you have MyChart) OR A paper copy in the mail If you have any lab test that is abnormal or we need to change your treatment, we will call you to review the results.   Testing/Procedures:Your physician has requested that you have a lower extremity arterial exercise duplex. During this test, exercise and ultrasound are used to evaluate arterial blood flow in the legs. Allow one hour for this exam. There are no restrictions or special instructions.    Follow-Up: At [facility], you and your health needs are our priority.  As part of our continuing mission to provide you with exceptional heart care, we have created designated Provider Care Teams.  These Care Teams include your primary Cardiologist (physician) and Advanced Practice Providers (APPs -  Physician Assistants and Nurse Practitioners) who all work together to provide you with the care you need, when you need it.  We recommend signing up for the patient portal called "MyChart".  Sign up information is provided on this After Visit Summary.  MyChart is used to connect with patients for Virtual Visits (Telemedicine).  Patients are able to view lab/test results, encounter notes, upcoming appointments, etc.  Non-urgent messages can be sent to your provider as well.   To learn more about what you can do with MyChart, go to [email or web address].    Your next appointment:   12 month(s)  The format for your next appointment:   In Person  Provider:   Ambrose Pancoast, NP         Other Instructions   Important Information About Sugar     Note he said it was still somewhat red tape to get  evaluated

## 2022-09-28 ENCOUNTER — Ambulatory Visit: Payer: Medicare Other | Attending: Nurse Practitioner

## 2022-09-28 ENCOUNTER — Other Ambulatory Visit: Payer: Self-pay | Admitting: Nurse Practitioner

## 2022-09-28 DIAGNOSIS — I739 Peripheral vascular disease, unspecified: Secondary | ICD-10-CM

## 2022-09-28 LAB — BASIC METABOLIC PANEL
BUN/Creatinine Ratio: 11 (ref 9–20)
BUN: 15 mg/dL (ref 6–24)
CO2: 25 mmol/L (ref 20–29)
Calcium: 9.2 mg/dL (ref 8.7–10.2)
Chloride: 105 mmol/L (ref 96–106)
Creatinine, Ser: 1.35 mg/dL — ABNORMAL HIGH (ref 0.76–1.27)
Glucose: 144 mg/dL — ABNORMAL HIGH (ref 70–99)
Potassium: 3.9 mmol/L (ref 3.5–5.2)
Sodium: 136 mmol/L (ref 134–144)
eGFR: 60 mL/min/{1.73_m2} (ref >59)

## 2022-10-09 ENCOUNTER — Ambulatory Visit
Admit: 2022-10-09 | Discharge: 2022-10-11 | Disposition: A | Discharge status: Home / Self Care | Payer: MEDICARE | Admitting: Student in an Organized Health Care Education/Training Program

## 2022-10-11 ENCOUNTER — Ambulatory Visit (HOSPITAL_COMMUNITY): Admission: RE | Admit: 2022-10-11 | Payer: Medicare Other | Source: Ambulatory Visit

## 2022-10-19 MED ORDER — TRIAMCINOLONE ACETONIDE 0.1 % TOPICAL OINTMENT
0 refills | 0.00000 days
Start: 2022-10-19 — End: ?

## 2022-10-30 ENCOUNTER — Other Ambulatory Visit: Payer: Self-pay | Admitting: Nurse Practitioner

## 2022-10-30 DIAGNOSIS — I739 Peripheral vascular disease, unspecified: Secondary | ICD-10-CM

## 2022-11-05 ENCOUNTER — Ambulatory Visit (HOSPITAL_COMMUNITY)
Admission: RE | Admit: 2022-11-05 | Discharge: 2022-11-05 | Disposition: A | Discharge status: Home / Self Care | Payer: Medicare Other | Source: Ambulatory Visit | Attending: Nurse Practitioner | Admitting: Nurse Practitioner

## 2022-11-05 DIAGNOSIS — I739 Peripheral vascular disease, unspecified: Secondary | ICD-10-CM | POA: Insufficient documentation

## 2022-11-08 ENCOUNTER — Encounter: Payer: Self-pay | Admitting: Nurse Practitioner

## 2022-11-29 ENCOUNTER — Ambulatory Visit (INDEPENDENT_AMBULATORY_CARE_PROVIDER_SITE_OTHER): Payer: Medicare Other | Admitting: Podiatry

## 2022-11-29 DIAGNOSIS — B351 Tinea unguium: Secondary | ICD-10-CM

## 2022-11-29 DIAGNOSIS — E119 Type 2 diabetes mellitus without complications: Secondary | ICD-10-CM

## 2022-11-29 DIAGNOSIS — L84 Corns and callosities: Secondary | ICD-10-CM

## 2022-11-29 DIAGNOSIS — Z7901 Long term (current) use of anticoagulants: Secondary | ICD-10-CM | POA: Diagnosis not present

## 2022-11-29 DIAGNOSIS — Z794 Long term (current) use of insulin: Secondary | ICD-10-CM | POA: Diagnosis not present

## 2022-11-29 DIAGNOSIS — M79674 Pain in right toe(s): Secondary | ICD-10-CM

## 2022-11-29 DIAGNOSIS — M79675 Pain in left toe(s): Secondary | ICD-10-CM | POA: Diagnosis not present

## 2022-11-29 NOTE — Patient Instructions (Addendum)
Dr. Margart Sickles Wart remover pads 17% salicylic acid

## 2022-12-02 NOTE — Progress Notes (Signed)
  Subjective:  Patient ID: Craig [name], male    DOB: 1961/07/28,  MRN: [id number]  Nails thickened elongated painful  Painful corn on right foot  61 y.o. male presents with the above complaint. History confirmed with patient.  Right foot calluse is painful again.  Nails are thickened and elongated and bothering him.  The rash is doing better he is seeing someone for this now  Objective:  Physical Exam: warm, good capillary refill, no trophic changes or ulcerative lesions, normal DP and PT pulses, normal sensory exam, and left foot no pain at fifth met base, right foot has a porokeratosis that is painful at the fifth metatarsal base.  Nails are thickened elongated yellow and discolored with subungual debris, he has +1 peripheral edema with venous stasis dermatitis throughout the lower extremities, improving   New radiographs taken today show no acute osseous abnormalities he does have prominent base of fifth met no fracture noted Assessment:   1. Pain due to onychomycosis of toenails of both feet   2. Type 2 diabetes mellitus without complication, with long-term current use of insulin ([place])   3. Long term current use of anticoagulant therapy   4. Callus of foot      Plan:  Patient was evaluated and treated and all questions answered.   Recommended urea with salicylic acid cream he should continue to use this  All symptomatic hyperkeratoses were safely debrided with a sterile #15 blade to patient's level of comfort without incident. We discussed preventative and palliative care of these lesions including supportive and accommodative shoegear, padding, prefabricated and custom molded accommodative orthoses, use of a pumice stone and lotions/creams daily.   Discussed the etiology and treatment options for the condition in detail with the patient. Educated patient on the topical and oral treatment options for mycotic nails. Recommended debridement of the nails today. Sharp and  mechanical debridement performed of all painful and mycotic nails today. Nails debrided in length and thickness using a nail nipper to level of comfort. Discussed treatment options including appropriate shoe gear. Follow up as needed for painful nails.     Return in about 3 months (around 02/28/2023) for at risk diabetic foot care.

## 2022-12-08 DIAGNOSIS — F32A Depression, unspecified depression type: Principal | ICD-10-CM

## 2022-12-08 MED ORDER — SERTRALINE 50 MG TABLET
ORAL_TABLET | Freq: Every day | ORAL | 3 refills | 0 days
Start: 2022-12-08 — End: ?

## 2022-12-10 ENCOUNTER — Ambulatory Visit: Admit: 2022-12-10 | Discharge: 2022-12-11 | Payer: MEDICARE

## 2022-12-10 DIAGNOSIS — L209 Atopic dermatitis, unspecified: Principal | ICD-10-CM

## 2022-12-10 MED ORDER — TRIAMCINOLONE ACETONIDE 0.1 % TOPICAL CREAM
Freq: Two times a day (BID) | TOPICAL | 3 refills | 0.00000 days | Status: CP
Start: 2022-12-10 — End: 2023-12-10

## 2022-12-10 MED ORDER — SERTRALINE 50 MG TABLET
ORAL_TABLET | Freq: Every day | ORAL | 3 refills | 90 days
Start: 2022-12-10 — End: ?

## 2022-12-10 MED ORDER — LISINOPRIL 10 MG TABLET
ORAL_TABLET | Freq: Every day | ORAL | 3 refills | 90 days
Start: 2022-12-10 — End: ?

## 2022-12-10 MED ORDER — PREDNISONE 10 MG TABLET
ORAL_TABLET | ORAL | 0 refills | 21.00000 days | Status: CP
Start: 2022-12-10 — End: 2022-12-31

## 2022-12-10 MED ORDER — HYDROCORTISONE 2.5 % TOPICAL CREAM
Freq: Two times a day (BID) | TOPICAL | 3 refills | 0.00000 days | Status: CP
Start: 2022-12-10 — End: 2023-12-10

## 2022-12-10 MED ORDER — DUPILUMAB 300 MG/2 ML SUBCUTANEOUS PEN INJECTOR
SUBCUTANEOUS | 0 refills | 0.00000 days | Status: CP
Start: 2022-12-10 — End: ?

## 2022-12-26 ENCOUNTER — Other Ambulatory Visit: Payer: Medicare Other

## 2023-01-04 ENCOUNTER — Ambulatory Visit (INDEPENDENT_AMBULATORY_CARE_PROVIDER_SITE_OTHER): Payer: Medicare HMO

## 2023-01-04 DIAGNOSIS — M2142 Flat foot [pes planus] (acquired), left foot: Secondary | ICD-10-CM

## 2023-01-04 DIAGNOSIS — M2141 Flat foot [pes planus] (acquired), right foot: Secondary | ICD-10-CM

## 2023-01-04 DIAGNOSIS — Z794 Long term (current) use of insulin: Secondary | ICD-10-CM

## 2023-01-04 DIAGNOSIS — E119 Type 2 diabetes mellitus without complications: Secondary | ICD-10-CM

## 2023-01-04 DIAGNOSIS — L84 Corns and callosities: Secondary | ICD-10-CM

## 2023-01-04 NOTE — Progress Notes (Unsigned)
Patient presents to the office today for diabetic shoe and insole measuring.  Patient was measured with brannock device to determine size and width for 1 pair of extra depth shoes and foam casted for 3 pair of insoles.   ABN signed.   Documentation of medical necessity will be sent to patient's treating diabetic doctor to verify and sign.   Patient's diabetic provider: Okey Dupre A. Manuella Ghazi  Shoes and insoles will be ordered at that time and patient will be notified for an appointment for fitting when they arrive.   Patient shoe selection-   1st   Shoe choice:   [id number] NEW BALANCE   Shoe size ordered: 40 W
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
## 2023-01-14 ENCOUNTER — Other Ambulatory Visit: Payer: Self-pay

## 2023-01-14 ENCOUNTER — Ambulatory Visit (INDEPENDENT_AMBULATORY_CARE_PROVIDER_SITE_OTHER): Payer: Medicare HMO | Admitting: Infectious Disease

## 2023-01-14 ENCOUNTER — Other Ambulatory Visit (HOSPITAL_COMMUNITY)
Admission: RE | Admit: 2023-01-14 | Discharge: 2023-01-14 | Disposition: A | Discharge status: Home / Self Care | Payer: Medicare HMO | Source: Ambulatory Visit | Attending: Infectious Disease | Admitting: Infectious Disease

## 2023-01-14 VITALS — BP 131/56 | HR 91 | Temp 97.8°F | Wt 226.0 lb

## 2023-01-14 DIAGNOSIS — R590 Localized enlarged lymph nodes: Secondary | ICD-10-CM | POA: Diagnosis not present

## 2023-01-14 DIAGNOSIS — R600 Localized edema: Secondary | ICD-10-CM

## 2023-01-14 DIAGNOSIS — Z86711 Personal history of pulmonary embolism: Secondary | ICD-10-CM

## 2023-01-14 DIAGNOSIS — I10 Essential (primary) hypertension: Secondary | ICD-10-CM

## 2023-01-14 DIAGNOSIS — I2510 Atherosclerotic heart disease of native coronary artery without angina pectoris: Secondary | ICD-10-CM

## 2023-01-14 DIAGNOSIS — B20 Human immunodeficiency virus [HIV] disease: Secondary | ICD-10-CM | POA: Diagnosis present

## 2023-01-14 DIAGNOSIS — Z794 Long term (current) use of insulin: Secondary | ICD-10-CM

## 2023-01-14 DIAGNOSIS — J45909 Unspecified asthma, uncomplicated: Secondary | ICD-10-CM | POA: Diagnosis not present

## 2023-01-14 DIAGNOSIS — E119 Type 2 diabetes mellitus without complications: Secondary | ICD-10-CM

## 2023-01-14 MED ORDER — ATORVASTATIN CALCIUM 40 MG PO TABS: 40.0000 mg | ORAL_TABLET | Freq: Every day | ORAL | 11 refills | Status: AC

## 2023-01-14 MED ORDER — BIKTARVY 50-200-25 MG PO TABS: 1.0000 | ORAL_TABLET | Freq: Every day | ORAL | 11 refills | Status: AC

## 2023-01-14 NOTE — Addendum Note (Signed)
Addended by: Caffie Pinto on: 01/14/2023 09:55 AM   Modules accepted: Orders

## 2023-01-15 ENCOUNTER — Other Ambulatory Visit: Payer: Self-pay

## 2023-01-15 ENCOUNTER — Other Ambulatory Visit: Payer: Medicare HMO

## 2023-01-15 DIAGNOSIS — B20 Human immunodeficiency virus [HIV] disease: Secondary | ICD-10-CM

## 2023-01-15 DIAGNOSIS — E119 Type 2 diabetes mellitus without complications: Secondary | ICD-10-CM

## 2023-01-15 LAB — URINE CYTOLOGY ANCILLARY ONLY
Chlamydia: NEGATIVE
Comment: NEGATIVE
Comment: NORMAL
Neisseria Gonorrhea: NEGATIVE

## 2023-01-16 LAB — T-HELPER CELLS (CD4) COUNT (NOT AT ARMC)
CD4 % Helper T Cell: 41 % (ref 33–65)
CD4 T Cell Abs: 507 /uL (ref 400–1790)

## 2023-01-17 ENCOUNTER — Ambulatory Visit (HOSPITAL_COMMUNITY)
Admission: RE | Admit: 2023-01-17 | Discharge: 2023-01-17 | Disposition: A | Discharge status: Home / Self Care | Payer: Medicare HMO | Source: Ambulatory Visit | Attending: Infectious Disease | Admitting: Infectious Disease

## 2023-01-17 DIAGNOSIS — R600 Localized edema: Secondary | ICD-10-CM | POA: Diagnosis present

## 2023-01-17 LAB — COMPLETE METABOLIC PANEL WITH GFR
AG Ratio: 0.7 (calc) — ABNORMAL LOW (ref 1.0–2.5)
ALT: 8 U/L — ABNORMAL LOW (ref 9–46)
AST: 12 U/L (ref 10–35)
Albumin: 3.5 g/dL — ABNORMAL LOW (ref 3.6–5.1)
Alkaline phosphatase (APISO): 84 U/L (ref 35–144)
BUN: 10 mg/dL (ref 7–25)
CO2: 27 mmol/L (ref 20–32)
Calcium: 8.8 mg/dL (ref 8.6–10.3)
Chloride: 101 mmol/L (ref 98–110)
Creat: 1.21 mg/dL (ref 0.70–1.35)
Globulin: 5.3 g/dL (calc) — ABNORMAL HIGH (ref 1.9–3.7)
Glucose, Bld: 226 mg/dL — ABNORMAL HIGH (ref 65–99)
Potassium: 4 mmol/L (ref 3.5–5.3)
Sodium: 134 mmol/L — ABNORMAL LOW (ref 135–146)
Total Bilirubin: 0.2 mg/dL (ref 0.2–1.2)
Total Protein: 8.8 g/dL — ABNORMAL HIGH (ref 6.1–8.1)
eGFR: 69 mL/min/{1.73_m2} (ref >=60)

## 2023-01-17 LAB — LIPID PANEL
Cholesterol: 96 mg/dL (ref <200)
HDL: 32 mg/dL — ABNORMAL LOW (ref >=40)
LDL Cholesterol (Calc): 38 mg/dL (calc)
Non-HDL Cholesterol (Calc): 64 mg/dL (calc) (ref <130)
Total CHOL/HDL Ratio: 3 (calc) (ref <5.0)
Triglycerides: 179 mg/dL — ABNORMAL HIGH (ref <150)

## 2023-01-17 LAB — CBC WITH DIFFERENTIAL/PLATELET
Absolute Monocytes: 338 cells/uL (ref 200–950)
Basophils Absolute: 20 cells/uL (ref 0–200)
Basophils Relative: 0.3 %
Eosinophils Absolute: 150 cells/uL (ref 15–500)
Eosinophils Relative: 2.3 %
HCT: 35 % — ABNORMAL LOW (ref 38.5–50.0)
Hemoglobin: 11.2 g/dL — ABNORMAL LOW (ref 13.2–17.1)
Lymphs Abs: 1352 cells/uL (ref 850–3900)
MCH: 27.5 pg (ref 27.0–33.0)
MCHC: 32 g/dL (ref 32.0–36.0)
MCV: 86 fL (ref 80.0–100.0)
MPV: 9.6 fL (ref 7.5–12.5)
Monocytes Relative: 5.2 %
Neutro Abs: 4641 cells/uL (ref 1500–7800)
Neutrophils Relative %: 71.4 %
Platelets: 365 10*3/uL (ref 140–400)
RBC: 4.07 10*6/uL — ABNORMAL LOW (ref 4.20–5.80)
RDW: 14.4 % (ref 11.0–15.0)
Total Lymphocyte: 20.8 %
WBC: 6.5 10*3/uL (ref 3.8–10.8)

## 2023-01-17 LAB — T PALLIDUM AB: T Pallidum Abs: POSITIVE — AB

## 2023-01-17 LAB — HIV-1 RNA QUANT-NO REFLEX-BLD
HIV 1 RNA Quant: NOT DETECTED Copies/mL
HIV-1 RNA Quant, Log: NOT DETECTED Log cps/mL

## 2023-01-17 LAB — RPR: RPR Ser Ql: REACTIVE — AB

## 2023-01-17 LAB — RPR TITER: RPR Titer: 1:2 {titer} — ABNORMAL HIGH

## 2023-01-21 DIAGNOSIS — L209 Atopic dermatitis, unspecified: Principal | ICD-10-CM

## 2023-01-21 MED ORDER — DUPIXENT 300 MG/2 ML SUBCUTANEOUS SYRINGE
0 refills | 0.00000 days
Start: 2023-01-21 — End: ?

## 2023-01-23 ENCOUNTER — Ambulatory Visit: Payer: Medicare Other | Admitting: Infectious Disease

## 2023-01-23 MED ORDER — DUPIXENT 300 MG/2 ML SUBCUTANEOUS SYRINGE
SUBCUTANEOUS | 0 refills | 0.00000 days | Status: CP
Start: 2023-01-23 — End: ?

## 2023-01-30 ENCOUNTER — Ambulatory Visit: Admit: 2023-01-30 | Discharge: 2023-01-31 | Payer: MEDICAID

## 2023-01-30 DIAGNOSIS — I2782 Chronic pulmonary embolism: Principal | ICD-10-CM

## 2023-01-30 DIAGNOSIS — N6315 Unspecified lump in the right breast, overlapping quadrants: Principal | ICD-10-CM

## 2023-01-30 DIAGNOSIS — I10 Essential (primary) hypertension: Principal | ICD-10-CM

## 2023-01-30 DIAGNOSIS — E119 Type 2 diabetes mellitus without complications: Principal | ICD-10-CM

## 2023-01-30 MED ORDER — LISINOPRIL 10 MG TABLET
ORAL_TABLET | Freq: Every day | ORAL | 3 refills | 90 days | Status: CP
Start: 2023-01-30 — End: 2024-01-30

## 2023-02-02 ENCOUNTER — Ambulatory Visit (INDEPENDENT_AMBULATORY_CARE_PROVIDER_SITE_OTHER): Payer: Medicare HMO

## 2023-02-02 ENCOUNTER — Ambulatory Visit (HOSPITAL_COMMUNITY)
Admission: EM | Admit: 2023-02-02 | Discharge: 2023-02-02 | Disposition: A | Discharge status: Home / Self Care | Payer: Medicare HMO | Attending: Physician Assistant | Admitting: Physician Assistant

## 2023-02-02 ENCOUNTER — Encounter (HOSPITAL_COMMUNITY): Payer: Self-pay

## 2023-02-02 DIAGNOSIS — M79605 Pain in left leg: Secondary | ICD-10-CM | POA: Diagnosis not present

## 2023-02-02 DIAGNOSIS — M7989 Other specified soft tissue disorders: Secondary | ICD-10-CM | POA: Diagnosis not present

## 2023-02-02 DIAGNOSIS — M25572 Pain in left ankle and joints of left foot: Secondary | ICD-10-CM

## 2023-02-02 MED ORDER — HYDROCODONE-ACETAMINOPHEN 5-325 MG PO TABS: 0.5000 | ORAL_TABLET | Freq: Two times a day (BID) | ORAL | 0 refills | Status: DC | PRN
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
## 2023-02-02 NOTE — ED Triage Notes (Signed)
Pt had a fall 6wks ago and possible twisted ankle causing pain and discomfort

## 2023-02-02 NOTE — Discharge Instructions (Signed)
Your x-ray was normal.  Please go get the ultrasound tomorrow to make sure there is not a blood clot.  Keep your leg elevated is much as possible.  I have called in a few doses of hydrocodone to help with your pain.  This medication is sedating and potentially addictive.  Please try to limit use is much as possible and use it only when pain is severe.  You can use Tylenol for breakthrough pain.  If your symptoms do not improve and your ultrasound is negative I recommend you follow-up with vascular.  Call them to schedule an appointment.  If you have any worsening symptoms including increasing swelling, increased pain, chest pain, shortness of breath, heart racing you should be seen immediately.

## 2023-02-03 ENCOUNTER — Encounter (HOSPITAL_COMMUNITY): Payer: Self-pay | Admitting: Emergency Medicine

## 2023-02-03 ENCOUNTER — Emergency Department (HOSPITAL_COMMUNITY): Admission: RE | Admit: 2023-02-03 | Payer: Medicare HMO | Source: Ambulatory Visit

## 2023-02-03 ENCOUNTER — Emergency Department (HOSPITAL_COMMUNITY)
Admission: EM | Admit: 2023-02-03 | Discharge: 2023-02-03 | Disposition: A | Discharge status: Home / Self Care | Payer: Medicare HMO | Attending: Emergency Medicine | Admitting: Emergency Medicine

## 2023-02-03 ENCOUNTER — Emergency Department (HOSPITAL_BASED_OUTPATIENT_CLINIC_OR_DEPARTMENT_OTHER): Payer: Medicare HMO

## 2023-02-03 DIAGNOSIS — R52 Pain, unspecified: Secondary | ICD-10-CM | POA: Diagnosis not present

## 2023-02-03 DIAGNOSIS — I82532 Chronic embolism and thrombosis of left popliteal vein: Secondary | ICD-10-CM | POA: Insufficient documentation

## 2023-02-03 DIAGNOSIS — Z91040 Latex allergy status: Secondary | ICD-10-CM | POA: Insufficient documentation

## 2023-02-03 DIAGNOSIS — Z20822 Contact with and (suspected) exposure to covid-19: Secondary | ICD-10-CM | POA: Diagnosis not present

## 2023-02-03 DIAGNOSIS — M79605 Pain in left leg: Secondary | ICD-10-CM | POA: Diagnosis present

## 2023-02-03 DIAGNOSIS — Z7901 Long term (current) use of anticoagulants: Secondary | ICD-10-CM | POA: Diagnosis not present

## 2023-02-03 DIAGNOSIS — M7989 Other specified soft tissue disorders: Secondary | ICD-10-CM | POA: Diagnosis not present

## 2023-02-03 LAB — RESP PANEL BY RT-PCR (RSV, FLU A&B, COVID)  RVPGX2
Influenza A by PCR: NEGATIVE
Influenza B by PCR: NEGATIVE
Resp Syncytial Virus by PCR: NEGATIVE
SARS Coronavirus 2 by RT PCR: NEGATIVE

## 2023-02-03 MED ORDER — FUROSEMIDE 20 MG PO TABS
40.0000 mg | ORAL_TABLET | Freq: Once | ORAL | Status: AC
  Administered 2023-02-03: 40 mg via ORAL
  Filled 2023-02-03: qty 2

## 2023-02-03 MED ORDER — FUROSEMIDE 20 MG PO TABS: 20.0000 mg | ORAL_TABLET | Freq: Every day | ORAL | 0 refills | Status: AC

## 2023-02-03 MED ORDER — ACETAMINOPHEN 325 MG PO TABS
650.0000 mg | ORAL_TABLET | Freq: Once | ORAL | Status: AC
  Administered 2023-02-03: 650 mg via ORAL
  Filled 2023-02-03: qty 2

## 2023-02-03 NOTE — ED Triage Notes (Signed)
Pt here from home for a swollen let leg , pt had an appointment for dvt study today at 11 am but did not make it ,

## 2023-02-03 NOTE — ED Provider Notes (Addendum)
[facility] Provider Note   CSN: [id number] Arrival date & time: 02/03/23  1506     History  No chief complaint on file.   Chioke Jorde is a 62 y.o. male here presenting with left leg pain.  Patient states that he injured his ankle several weeks ago.  He states that he has progressive pain and swelling for the last several weeks.  Patient went to urgent care yesterday and had x-rays that are negative.  Patient had an outpatient ultrasound scheduled today and states that he missed his appointment since he had to go to work.  He came to the ER to get an ultrasound.  Denies any chest pain or shortness of breath.  Of note, patient has a chronic left leg DVT and is compliant with his Xarelto.  The history is provided by the patient.       Home Medications Prior to Admission medications   Medication Sig Start Date End Date Taking? Authorizing Provider  acetaminophen (TYLENOL) 500 MG tablet Take 1,000 mg by mouth every 6 (six) hours as needed for moderate pain or headache.    [provider]  atorvastatin (LIPITOR) 40 MG tablet Take 1 tablet (40 mg total) by mouth daily. 01/14/23   Truman Hayward, MD  betamethasone dipropionate 0.05 % cream Apply topically 2 (two) times daily. 08/28/22   McDonald, Stephan Minister, DPM  bictegravir-emtricitabine-tenofovir AF (BIKTARVY) 50-200-25 MG TABS tablet Take 1 tablet by mouth daily. 01/14/23   Truman Hayward, MD  DUPIXENT 300 MG/2ML prefilled syringe Inject 300 mg into the skin every 14 (fourteen) days. 03/28/20   [provider]  empagliflozin (JARDIANCE) 25 MG TABS tablet Take 25 mg by mouth daily. 04/06/21   [provider]  fluticasone Asencion Islam) 50 MCG/ACT nasal spray  11/24/15   [provider]  HYDROcodone-acetaminophen (NORCO/VICODIN) 5-325 MG tablet Take 0.5-1 tablets by mouth 2 (two) times daily as needed. 02/02/23   Raspet, Derry Skill, PA-C  hydrOXYzine (ATARAX/VISTARIL)  25 MG tablet Take 1 tablet (25 mg total) by mouth at bedtime as needed. Patient taking differently: Take 25 mg by mouth at bedtime as needed (sleep). 07/06/21   Pearson Forster, NP  lisinopril (ZESTRIL) 5 MG tablet Take 5 mg by mouth daily. 08/23/21   [provider]  methocarbamol (ROBAXIN) 750 MG tablet Take 750 mg by mouth 4 (four) times daily as needed. 09/04/21   [provider]  metoprolol succinate (TOPROL XL) 25 MG 24 hr tablet Take 1.5 tablets (37.5 mg total) by mouth daily. 09/26/22   Marylu Lund., NP  omeprazole (PRILOSEC) 40 MG capsule Take 40 mg by mouth daily. 08/23/21   [provider]  rivaroxaban (XARELTO) 20 MG TABS tablet Take 20 mg by mouth daily with supper.    [provider]  Semaglutide (RYBELSUS) 14 MG TABS Take 14 mg by mouth daily. 06/30/20   [provider]  sertraline (ZOLOFT) 50 MG tablet Take 50 mg by mouth daily. 03/15/20 09/26/22  [provider]  tiZANidine (ZANAFLEX) 4 MG tablet Take 1 tablet (4 mg total) by mouth every 6 (six) hours as needed for muscle spasms. 07/06/21   Pearson Forster, NP  triamcinolone ointment (KENALOG) 0.1 % Apply 1 application topically 2 (two) times daily as needed (itching). 08/30/21   Mignon Pine, DO      Allergies    Penicillins, Shellfish allergy, Latex, and Metformin    Review  of Systems   Review of Systems  Skin:        Left leg pain  All other systems reviewed and are negative.   Physical Exam Updated Vital Signs BP (!) 148/66   Pulse (!) 111   Temp 97.9 F (36.6 C)   Resp 18   SpO2 100%  Physical Exam Vitals and nursing note reviewed.  Constitutional:      Appearance: Normal appearance.  HENT:     Head: Normocephalic.     Nose: Nose normal.     Mouth/Throat:     Mouth: Mucous membranes are moist.  Eyes:     Extraocular Movements: Extraocular movements intact.     Pupils: Pupils are equal, round, and reactive to light.  Cardiovascular:     Rate and  Rhythm: Normal rate and regular rhythm.     Pulses: Normal pulses.     Heart sounds: Normal heart sounds.  Pulmonary:     Effort: Pulmonary effort is normal.     Breath sounds: Normal breath sounds.  Abdominal:     General: Abdomen is flat.     Palpations: Abdomen is soft.  Musculoskeletal:     Cervical back: Normal range of motion and neck supple.     Comments: Left leg 2+ edema.  Some calf tenderness as well.  2+ pulses.  Able to wiggle toes.  Skin:    Capillary Refill: Capillary refill takes less than 2 seconds.  Neurological:     General: No focal deficit present.     Mental Status: He is alert and oriented to person, place, and time.  Psychiatric:        Mood and Affect: Mood normal.        Behavior: Behavior normal.     ED Results / Procedures / Treatments   Labs (all labs ordered are listed, but only abnormal results are displayed) Labs Reviewed - No data to display  EKG None  Radiology VAS [place] LOWER EXTREMITY VENOUS (DVT)  Result Date: 02/03/2023  Lower Venous DVT Study Patient Name:  Annette Yockel  Date of Exam:   02/03/2023 Medical Rec #: [id number]      Accession #:    [id number] Date of Birth: 1961/12/11     Patient Gender: M Patient Age:   58 years [facility]am [facility]  [facility] Procedure:      VAS [place] LOWER EXTREMITY VENOUS (DVT) Referring Phys: Junie Panning RASPET --------------------------------------------------------------------------------  Indications: Pain, and Swelling.  Limitations: Patient wearing multiple layers of clothing, would only remove jeans. Comparison Study: Prior negative left LEV done 01/17/23 Performing Technologist: Sharion Dove RVS  Examination Guidelines: A complete evaluation includes B-mode imaging, spectral Doppler, color Doppler, and power Doppler as needed of all accessible portions of each vessel. Bilateral testing is considered an integral part of a complete examination. Limited examinations for reoccurring indications may be performed as  noted. The reflux portion of the exam is performed with the patient in reverse Trendelenburg.  Right Technical Findings: Right leg not evaluated.  +---------+---------------+---------+-----------+---------------+--------------+ LEFT     CompressibilityPhasicitySpontaneityProperties     Thrombus Aging +---------+---------------+---------+-----------+---------------+--------------+ CFV                                                        Not well  visualized     +---------+---------------+---------+-----------+---------------+--------------+ SFJ                                                        Not well                                                                  visualized     +---------+---------------+---------+-----------+---------------+--------------+ FV Prox  Full                               pulsatile                                                                 waveforms                     +---------+---------------+---------+-----------+---------------+--------------+ FV Mid   Full                                                             +---------+---------------+---------+-----------+---------------+--------------+ FV DistalFull                                                             +---------+---------------+---------+-----------+---------------+--------------+ PFV      Full                                                             +---------+---------------+---------+-----------+---------------+--------------+ POP      Partial                                           Chronic        +---------+---------------+---------+-----------+---------------+--------------+ PTV      Full                                                             +---------+---------------+---------+-----------+---------------+--------------+ PERO     Full                                                              +---------+---------------+---------+-----------+---------------+--------------+  Gastroc  Full                                                             +---------+---------------+---------+-----------+---------------+--------------+     Summary: LEFT: - Findings consistent with chronic deep vein thrombosis involving the left popliteal vein. - Findings appear essentially unchanged compared to previous examination. - No cystic structure found in the popliteal fossa. Pulsatile waveforms noted.  *See table(s) above for measurements and observations.    Preliminary    DG Ankle Complete Left  Result Date: 02/02/2023 CLINICAL DATA:  Pain and swelling EXAM: LEFT ANKLE COMPLETE - 3+ VIEW COMPARISON:  Foot x-ray 03/13/2022 FINDINGS: There is no evidence of fracture, dislocation, or joint effusion. There is no evidence of arthropathy or other focal bone abnormality. Enthesophyte at the Achilles tendon insertion. Prominent soft tissue swelling about the ankle is more pronounced medially. Atherosclerotic vascular calcifications. IMPRESSION: Prominent soft tissue swelling about the ankle is more pronounced medially. No acute osseous abnormality. Electronically Signed   By: Davina Poke D.O.   On: 02/02/2023 15:03    Procedures Procedures    Medications Ordered in ED Medications  furosemide (LASIX) tablet 40 mg (40 mg Oral Given 02/03/23 1848)    ED Course/ Medical Decision Making/ A&P                             Medical Decision Making Altan Bodley is a 62 y.o. male here presenting with left leg swelling.  Patient has a history of DVT on the left leg and has more swelling for the last month.  Consider acute on chronic DVT.  Patient has no chest pain or shortness of breath to suggest PE  7:11 PM Ultrasound showed chronic DVT that is unchanged from previous.  Patient is already on Xarelto.  I do not think he has a failure of outpatient  anticoagulation.  He does have some leg edema.  He request diuretics.  He had labs checked 2 weeks ago and his creatinine is normal.  I recommend Lasix for several days and repeat ultrasound in a month or sooner if he has worsening swelling.  7:26 PM I was informed by nursing that his temperature went up to 101.6.  I felt that patient should get CBC and BMP and COVID and flu test.  Patient states that he does not want to get blood drawn.  He is agreeable to get COVID and flu test.  I told him to return to the ER if he has persistent fever or worsening pain in the leg.  Clinically, I do not think he has cellulitis in the left leg right now and I do not think he has necrotizing fasciitis.  I would like to get a CBC but patient states that he does not want to stay.   Problems Addressed: Chronic deep vein thrombosis (DVT) of popliteal vein of left lower extremity ([place]): chronic illness or injury  Amount and/or Complexity of Data Reviewed Radiology: ordered and independent interpretation performed. Decision-making details documented in ED Course.  Risk OTC drugs. Prescription drug management.    Final Clinical Impression(s) / ED Diagnoses Final diagnoses:  None    Rx / DC Orders ED Discharge Orders     None  Drenda Freeze, MD 02/03/23 Remonia Richter    Drenda Freeze, MD 02/03/23 [phone]

## 2023-02-03 NOTE — Discharge Instructions (Signed)
Please take Lasix 20 mg daily for 3 days.  As we discussed, you have chronic blood clot in the left leg.  Please use compression stockings.  Please continue taking your Xarelto as prescribed  You need to get a repeat ultrasound in a month with your doctor.  If you have worsening swelling, you need to get a repeat ultrasound sooner  Return to ER if you have worse leg swelling, chest pain, shortness of breath

## 2023-02-03 NOTE — Progress Notes (Signed)
VASCULAR LAB    Left lower extremity venous duplex has been performed.  See CV proc for preliminary results.   Ardit Danh, RVT 02/03/2023, 6:20 PM

## 2023-02-25 ENCOUNTER — Ambulatory Visit: Admit: 2023-02-25 | Payer: MEDICAID

## 2023-02-28 ENCOUNTER — Ambulatory Visit (INDEPENDENT_AMBULATORY_CARE_PROVIDER_SITE_OTHER): Payer: 59 | Admitting: Podiatry

## 2023-02-28 DIAGNOSIS — L84 Corns and callosities: Secondary | ICD-10-CM

## 2023-02-28 DIAGNOSIS — E119 Type 2 diabetes mellitus without complications: Secondary | ICD-10-CM | POA: Diagnosis not present

## 2023-02-28 DIAGNOSIS — B351 Tinea unguium: Secondary | ICD-10-CM | POA: Diagnosis not present

## 2023-02-28 DIAGNOSIS — M79675 Pain in left toe(s): Secondary | ICD-10-CM | POA: Diagnosis not present

## 2023-02-28 DIAGNOSIS — M79674 Pain in right toe(s): Secondary | ICD-10-CM

## 2023-02-28 DIAGNOSIS — Z794 Long term (current) use of insulin: Secondary | ICD-10-CM | POA: Diagnosis not present

## 2023-02-28 DIAGNOSIS — Z7901 Long term (current) use of anticoagulants: Secondary | ICD-10-CM

## 2023-03-04 NOTE — Progress Notes (Signed)
  Subjective:  Patient ID: Craig [name], male    DOB: [birth year],  MRN: [id number]  Nails thickened elongated painful  Painful corn on right foot  62 y.o. male presents with the above complaint. History confirmed with patient.  Right foot calluse is painful again.  Nails are thickened and elongated and bothering him.  He had another DVT in the left leg recently  Objective:  Physical Exam: warm, good capillary refill, no trophic changes or ulcerative lesions, normal DP and PT pulses, normal sensory exam, and left foot no pain at fifth met base, right foot has a porokeratosis that is painful at the fifth metatarsal base.  Nails are thickened elongated yellow and discolored with subungual debris, he has +1 peripheral edema with venous stasis dermatitis throughout the lower extremities, improving   New radiographs taken today show no acute osseous abnormalities he does have prominent base of fifth met no fracture noted Assessment:   1. Pain due to onychomycosis of toenails of both feet   2. Type 2 diabetes mellitus without complication, with long-term current use of insulin ([place])   3. Long term current use of anticoagulant therapy   4. Callus of foot      Plan:  Patient was evaluated and treated and all questions answered.   Recommended urea with salicylic acid cream he should continue to use this on the callus  All symptomatic hyperkeratoses were safely debrided with a sterile #15 blade to patient's level of comfort without incident. We discussed preventative and palliative care of these lesions including supportive and accommodative shoegear, padding, prefabricated and custom molded accommodative orthoses, use of a pumice stone and lotions/creams daily.   Discussed the etiology and treatment options for the condition in detail with the patient. Educated patient on the topical and oral treatment options for mycotic nails. Recommended debridement of the nails today. Sharp and mechanical  debridement performed of all painful and mycotic nails today. Nails debrided in length and thickness using a nail nipper to level of comfort. Discussed treatment options including appropriate shoe gear. Follow up as needed for painful nails.  Continue elevation and compression stockings when able for his edema, discussed with him this may be worse after his recent chronic DVT  Return in about 3 months (around 02/28/2023) for at risk diabetic foot care.

## 2023-03-07 ENCOUNTER — Emergency Department
Admit: 2023-03-07 | Discharge: 2023-03-08 | Disposition: A | Discharge status: Home / Self Care | Payer: MEDICAID | Attending: Family Medicine

## 2023-03-07 ENCOUNTER — Ambulatory Visit
Admit: 2023-03-07 | Discharge: 2023-03-08 | Disposition: A | Discharge status: Home / Self Care | Payer: MEDICAID | Attending: Family Medicine

## 2023-03-07 DIAGNOSIS — M25532 Pain in left wrist: Principal | ICD-10-CM

## 2023-03-07 MED ORDER — PREDNISONE 20 MG TABLET
ORAL_TABLET | Freq: Every day | ORAL | 0 refills | 4 days | Status: CP
Start: 2023-03-07 — End: 2023-03-11

## 2023-03-08 MED ORDER — XARELTO 20 MG TABLET
ORAL_TABLET | 3 refills | 0 days | Status: CP
Start: 2023-03-08 — End: ?

## 2023-03-08 MED ORDER — OMEPRAZOLE 40 MG CAPSULE,DELAYED RELEASE
ORAL_CAPSULE | ORAL | 3 refills | 0 days | Status: CP
Start: 2023-03-08 — End: ?

## 2023-03-10 MED ORDER — COLCHICINE 0.6 MG TABLET
ORAL_TABLET | 3 refills | 0 days
Start: 2023-03-10 — End: ?

## 2023-03-15 DIAGNOSIS — N6315 Unspecified lump in the right breast, overlapping quadrants: Principal | ICD-10-CM

## 2023-04-30 ENCOUNTER — Ambulatory Visit: Admit: 2023-04-30 | Discharge: 2023-05-01 | Payer: MEDICARE

## 2023-04-30 DIAGNOSIS — E119 Type 2 diabetes mellitus without complications: Principal | ICD-10-CM

## 2023-04-30 DIAGNOSIS — I10 Essential (primary) hypertension: Principal | ICD-10-CM

## 2023-04-30 DIAGNOSIS — I2699 Other pulmonary embolism without acute cor pulmonale: Principal | ICD-10-CM

## 2023-04-30 MED ORDER — RYBELSUS 3 MG TABLET
ORAL_TABLET | Freq: Every day | ORAL | 0 refills | 30 days | Status: CP
Start: 2023-04-30 — End: 2023-05-30

## 2023-05-03 DIAGNOSIS — L209 Atopic dermatitis, unspecified: Principal | ICD-10-CM

## 2023-05-03 MED ORDER — DUPIXENT 300 MG/2 ML SUBCUTANEOUS SYRINGE
0 refills | 0.00000 days
Start: 2023-05-03 — End: ?

## 2023-05-22 ENCOUNTER — Ambulatory Visit: Admit: 2023-05-22 | Discharge: 2023-05-23 | Payer: MEDICARE | Attending: Internal Medicine | Primary: Internal Medicine

## 2023-05-22 ENCOUNTER — Ambulatory Visit: Admit: 2023-05-22 | Discharge: 2023-05-23 | Payer: MEDICARE

## 2023-05-30 ENCOUNTER — Ambulatory Visit (INDEPENDENT_AMBULATORY_CARE_PROVIDER_SITE_OTHER): Payer: 59 | Admitting: Podiatry

## 2023-05-30 DIAGNOSIS — L84 Corns and callosities: Secondary | ICD-10-CM | POA: Diagnosis not present

## 2023-05-30 DIAGNOSIS — M2142 Flat foot [pes planus] (acquired), left foot: Secondary | ICD-10-CM

## 2023-05-30 DIAGNOSIS — B351 Tinea unguium: Secondary | ICD-10-CM

## 2023-05-30 DIAGNOSIS — M79675 Pain in left toe(s): Secondary | ICD-10-CM

## 2023-05-30 DIAGNOSIS — E119 Type 2 diabetes mellitus without complications: Secondary | ICD-10-CM

## 2023-05-30 DIAGNOSIS — M79674 Pain in right toe(s): Secondary | ICD-10-CM | POA: Diagnosis not present

## 2023-05-30 DIAGNOSIS — M2141 Flat foot [pes planus] (acquired), right foot: Secondary | ICD-10-CM

## 2023-05-30 MED ORDER — RYBELSUS 7 MG TABLET
ORAL_TABLET | Freq: Every day | ORAL | 0 refills | 30 days | Status: CP
Start: 2023-05-30 — End: 2023-06-29

## 2023-06-03 NOTE — Progress Notes (Signed)
  Subjective:  Patient ID: Craig [name], male    DOB: 04/17/1961,  MRN: [id number]  Nails thickened elongated painful  Painful corn on right foot  62 y.o. male presents with the above complaint. History confirmed with patient.     Nails are thickened and elongated and bothering him.   Objective:  Physical Exam: warm, good capillary refill, no trophic changes or ulcerative lesions, normal DP and PT pulses, normal sensory exam.  Nails are thickened elongated yellow and discolored with subungual debris, he has +1 peripheral edema with venous stasis dermatitis throughout the lower extremities, improving   New radiographs taken today show no acute osseous abnormalities he does have prominent base of fifth met no fracture noted Assessment:   1. Pain due to onychomycosis of toenails of both feet      Plan:  Patient was evaluated and treated and all questions answered.       Discussed the etiology and treatment options for the condition in detail with the patient. Educated patient on the topical and oral treatment options for mycotic nails. Recommended debridement of the nails today. Sharp and mechanical debridement performed of all painful and mycotic nails today. Nails debrided in length and thickness using a nail nipper to level of comfort. Discussed treatment options including appropriate shoe gear. Follow up as needed for painful nails.  Diabetic shoes were ready for dispensing today, they are assessed for form fit and function.  He was pleased with the fit.  He will gradually wear them and let me know if there are issues  No follow-ups on file.

## 2023-06-19 MED ORDER — EMPAGLIFLOZIN 25 MG TABLET
ORAL_TABLET | Freq: Every day | ORAL | 3 refills | 90 days | Status: CP
Start: 2023-06-19 — End: ?

## 2023-06-29 MED ORDER — RYBELSUS 14 MG TABLET
ORAL_TABLET | Freq: Every day | ORAL | 11 refills | 0 days | Status: CP
Start: 2023-06-29 — End: ?

## 2023-07-25 ENCOUNTER — Ambulatory Visit
Admit: 2023-07-25 | Discharge: 2023-07-26 | Payer: MEDICARE | Attending: Physician Assistant | Primary: Physician Assistant

## 2023-07-25 DIAGNOSIS — L209 Atopic dermatitis, unspecified: Principal | ICD-10-CM

## 2023-07-25 MED ORDER — HYDROCORTISONE 2.5 % TOPICAL CREAM
Freq: Two times a day (BID) | TOPICAL | 3 refills | 0.00000 days | Status: CN
Start: 2023-07-25 — End: 2024-07-24

## 2023-07-25 MED ORDER — DUPIXENT 300 MG/2 ML SUBCUTANEOUS SYRINGE
SUBCUTANEOUS | 0 refills | 0.00000 days | Status: CP
Start: 2023-07-25 — End: ?

## 2023-07-25 MED ORDER — TRIAMCINOLONE ACETONIDE 0.1 % TOPICAL OINTMENT
Freq: Two times a day (BID) | TOPICAL | 5 refills | 0.00000 days | Status: CP
Start: 2023-07-25 — End: 2024-07-24

## 2023-07-25 MED ORDER — DUPILUMAB 300 MG/2 ML SUBCUTANEOUS PEN INJECTOR
SUBCUTANEOUS | 11 refills | 0.00000 days | Status: CP
Start: 2023-07-25 — End: ?
  Filled 2023-08-06: qty 4, 14d supply, fill #0

## 2023-07-25 MED ORDER — HYDROCORTISONE 2.5 % TOPICAL OINTMENT
Freq: Two times a day (BID) | TOPICAL | 5 refills | 0.00000 days | Status: CP
Start: 2023-07-25 — End: 2024-07-24

## 2023-07-25 MED ORDER — TRIAMCINOLONE ACETONIDE 0.1 % TOPICAL CREAM
Freq: Two times a day (BID) | TOPICAL | 3 refills | 0.00000 days | Status: CN
Start: 2023-07-25 — End: 2024-07-24

## 2023-07-26 DIAGNOSIS — L209 Atopic dermatitis, unspecified: Principal | ICD-10-CM

## 2023-07-31 ENCOUNTER — Ambulatory Visit: Admit: 2023-07-31 | Discharge: 2023-08-01 | Payer: MEDICARE

## 2023-07-31 DIAGNOSIS — I10 Essential (primary) hypertension: Principal | ICD-10-CM

## 2023-07-31 DIAGNOSIS — N6315 Unspecified lump in the right breast, overlapping quadrants: Principal | ICD-10-CM

## 2023-07-31 DIAGNOSIS — E119 Type 2 diabetes mellitus without complications: Principal | ICD-10-CM

## 2023-07-31 DIAGNOSIS — B20 Human immunodeficiency virus [HIV] disease: Principal | ICD-10-CM

## 2023-07-31 DIAGNOSIS — I2699 Other pulmonary embolism without acute cor pulmonale: Principal | ICD-10-CM

## 2023-07-31 DIAGNOSIS — I739 Peripheral vascular disease, unspecified: Principal | ICD-10-CM

## 2023-08-02 NOTE — Unmapped (Signed)
[facility]   Patient Onboarding/Medication Counseling    [name] is a 62 y.o. male with Atopic dermatitis who I am counseling today on initiation of therapy.  I am speaking to the patient.    Was a Nurse, learning disability used for this call? No    Verified patient's date of birth / HIPAA.    Specialty medication(s) to be sent: Inflammatory Disorders: Dupixent      Non-specialty medications/supplies to be sent: No      Medications not needed at this time: n/a         Dupixent (dupilumab)    Medication & Administration     Dosage: Atopic dermatitis: Inject 600mg  under the skin as a loading dose followed by 300mg  every 14 days thereafter    Administration:     Dupixent Pen  1. Gather all supplies needed for injection on a clean, flat working surface: medication syringe removed from packaging, alcohol swab, sharps container, etc.  2. Look at the medication label - look for correct medication, correct dose, and check the expiration date  3. Look at the medication - the liquid in the pen should appear clear and colorless to pale yellow  4. Lay the pen on a flat surface and allow it to warm up to room temperature for at least 45 minutes  5. Select injection site - you can use the front of your thigh or your belly (but not the area 2 inches around your belly button); if someone else is giving you the injection you can also use your upper arm in the skin covering your triceps muscle  6. Prepare injection site - wash your hands and clean the skin at the injection site with an alcohol swab and let it air dry, do not touch the injection site again before the injection  7. Hold the middle of the body of the pen and gently pull the needle safety cap straight out. Be careful not to bend the needle. Do not remove until immediately prior to injection  8. Press the pen down onto the injection site at a 90 degree angle.   9. You will hear a click as the injection starts, and then a second click when the injection is ALMOST done. Keep holding the pen against the skin for 5 more seconds after the second click.   10. Check that the pen is empty by looking in the viewing window - the yellow indicator bar should be stopped, and should fill the window.   11. Remove the pen from the skin by lifting straight up.   12. Dispose of the used pen immediately in your sharps disposal container  13. If you see any blood at the injection site, press a cotton ball or gauze on the site and maintain pressure until the bleeding stops, do not rub the injection site    Adherence/Missed dose instructions:  If a dose is missed, administer within 7 days from the missed dose and then resume the original schedule. If the missed dose is not administered within 7 days, you can either wait until the next dose on the original schedule or take your dose now and resume every 14 days from the new injection date. Do not use 2 doses at the same time or extra doses.      Goals of Therapy     -Reduce symptoms of pruritus and dermatitis  -Prevent exacerbations  -Minimize therapeutic risks  -Avoidance of long-term systemic and topical glucocorticoid use  -Maintenance  of effective psychosocial functioning    Side Effects & Monitoring Parameters     Injection site reaction (redness, irritation, inflammation localized to the site of administration)  Signs of a common cold - minor sore throat, runny or stuffy nose, etc.  Recurrence of cold sores (herpes simplex)      The following side effects should be reported to the provider:  Signs of a hypersensitivity reaction - rash; hives; itching; red, swollen, blistered, or peeling skin; wheezing; tightness in the chest or throat; difficulty breathing, swallowing, or talking; swelling of the mouth, face, lips, tongue, or throat; etc.  Eye pain or irritation or any visual disturbances  Shortness of breath or worsening of breathing      Contraindications, Warnings, & Precautions     Have your bloodwork checked as you have been told by your prescriber   Birth control pills and other hormone-based birth control may not work as well to prevent pregnancy  Talk with your doctor if you are pregnant, planning to become pregnant, or breastfeeding  Discuss the possible need for holding your dose(s) of Dupixent?? when a planned procedure is scheduled with the prescriber as it may delay healing/recovery timeline       Drug/Food Interactions     Medication list reviewed in Epic. The patient was instructed to inform the care team before taking any new medications or supplements. No drug interactions identified.   Talk with you prescriber or pharmacist before receiving any live vaccinations while taking this medication and after you stop taking it    Storage, Handling Precautions, & Disposal     Store this medication in the refrigerator.  Do not freeze  If needed, you may store at room temperature for up to 14 days  Store in original packaging, protected from light  Do not shake  Dispose of used syringes in a sharps disposal container            Current Medications (including OTC/herbals), Comorbidities and Allergies     Current Outpatient Medications   Medication Sig Dispense Refill    atorvastatin (LIPITOR) 40 MG tablet Take 1 tablet (40 mg total) by mouth daily. 90 tablet 3    betamethasone dipropionate 0.05 % cream Apply 1 Application topically two (2) times a day.      BIKTARVY 50-200-25 mg tablet Take 1 tablet by mouth daily. 90 tablet 3    [START ON 12/14/2023] colchicine (COLCRYS) 0.6 mg tablet TAKE 1 TABLET BY MOUTH DAILY AS NEEDED FOR GOUT 90 tablet 3    dupilumab 300 mg/2 mL PnIj Inject the contents of 2 pens (600mg ) under the skin for loading dose 4 mL 0    dupilumab 300 mg/2 mL PnIj Inject the contents of 1 pen (300 mg) under the skin every fourteen (14) days. For maintenance 4 mL 11    ELIQUIS 5 mg Tab Take 1 tablet (5 mg total) by mouth two (2) times a day.      empagliflozin (JARDIANCE) 25 mg tablet Take 1 tablet (25 mg total) by mouth daily. 90 tablet 3    furosemide (LASIX) 20 MG tablet Take 1 tablet (20 mg total) by mouth daily.      hydrocortisone 2.5 % cream Apply 1 application. topically two (2) times a day. 453 g 3    hydrocortisone 2.5 % ointment Apply 1 Application topically two (2) times a day. To active areas of the face until smooth 454 g 5    hydrOXYzine (ATARAX) 25 MG tablet Take  1 tablet (25 mg total) by mouth every six (6) hours as needed.      lisinopril (PRINIVIL,ZESTRIL) 10 MG tablet Take 1 tablet (10 mg total) by mouth daily. 90 tablet 3    methocarbamoL (ROBAXIN) 750 MG tablet 750mg  4x daily prn muscle spasms 60 tablet 2    metoprolol succinate (TOPROL-XL) 25 MG 24 hr tablet Take 1 tablet (25 mg total) by mouth daily. 90 tablet 3    omeprazole (PRILOSEC) 40 MG capsule TAKE 1 CAPSULE(40 MG) BY MOUTH DAILY 90 capsule 3    semaglutide (RYBELSUS) 14 mg Tab Take 14 mg by mouth daily. 30 tablet 11    sertraline (ZOLOFT) 50 MG tablet Take 1 tablet (50 mg total) by mouth daily. 90 tablet 3    triamcinolone (KENALOG) 0.1 % cream Apply topically two (2) times a day. (Patient not taking: Reported on 07/25/2023) 453 g 3    triamcinolone (KENALOG) 0.1 % ointment Apply topically two (2) times a day. To active areas of trunk and extremities until smooth 454 g 5     No current facility-administered medications for this visit.       Allergies   Allergen Reactions    Latex Rash    Shellfish Containing Products      Other reaction(s): SHORTNESS OF BREATH    Metformin Nausea Only     Both IR and XR formulations    Both IR and XR formulations   Both IR and XR formulations    Both IR and XR formulations      Both IR and XR formulations Both IR and XR formulations    Penicillins Rash       Patient Active Problem List   Diagnosis    Pulmonary embolism 1/12, long-term anticoag per hematology    Allergy to latex    Chronic cardiopulmonary disease (CMS-HCC)    Chronic diastolic heart failure (CMS-HCC)    Cytomegaloviral disease (CMS-HCC)    Dermatitis Dysplasia of anus    Enthesopathy of knee    Esophageal reflux    Gout    Horseshoe tear of retina    Human immunodeficiency virus (HIV) disease (CMS-HCC)    Essential hypertension    Molluscum contagiosum    Mononeuritis    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 infarction    Convulsions (CMS-HCC)    Hereditary and idiopathic peripheral neuropathy    Inguinal hernia    Early syphilis, secondary syphilis    Coronary atherosclerosis (RAF-HCC)    Acid reflux (RAF-HCC)    Chronic eczema    Depression    Allergic rhinitis    Uncontrolled type 2 diabetes mellitus    Diastasis recti    AIDS (CMS-HCC)    Obesity (BMI 30.0-34.9)    Disc disease, degenerative, lumbar or lumbosacral    'Light-for-dates' infant with signs of fetal malnutrition    Hyperlipidemia    Syncope    Dehydration    Diarrhea of presumed infectious origin    Acute renal failure (ARF) (CMS-HCC)       Reviewed and up to date in Epic.    Appropriateness of Therapy     Acute infections noted within Epic:  No active infections  Patient reported infection: None    Is the medication and dose appropriate based on diagnosis, medication list, comorbidities, allergies, medical history, patient???s ability to self-administer the medication, and therapeutic goals? Yes    Prescription has been clinically reviewed: Yes      Baseline Quality of Life Assessment  How many days over the past month did your  Atopic dermatitis  keep you from your normal activities? For example, brushing your teeth or getting up in the morning. Patient declined to answer    Financial Information     Medication Assistance provided: Prior Authorization    Anticipated copay of  $0 / 14- loading and 28- maintenance  reviewed with patient. Verified delivery address.    Delivery Information     Scheduled delivery date: 08/07/2023    Expected start date: 08/07/2023      Medication will be delivered via UPS to the prescription address in [facility].  This shipment will not require a signature.      Explained the services we provide at [facility] and that each month we would call to set up refills.  Stressed importance of returning phone calls so that we could ensure they receive their medications in time each month.  Informed patient that we should be setting up refills 7-10 days prior to when they will run out of medication.  A pharmacist will reach out to perform a clinical assessment periodically.  Informed patient that a welcome packet, containing information about our pharmacy and other support services, a Notice of Privacy Practices, and a drug information handout will be sent.      The patient or caregiver noted above participated in the development of this care plan and knows that they can request review of or adjustments to the care plan at any time.      Patient or caregiver verbalized understanding of the above information as well as how to contact the pharmacy at [phone] option 4 with any questions/concerns.  The pharmacy is open Monday through Friday 8:30am-4:30pm.  A pharmacist is available 24/7 via pager to answer any clinical questions they may have.    Patient Specific Needs     Does the patient have any physical, cognitive, or cultural barriers? No    Does the patient have adequate living arrangements? (i.e. the ability to store and take their medication appropriately) Yes    Did you identify any home environmental safety or security hazards? No    Patient prefers to have medications discussed with  Patient     Is the patient or caregiver able to read and understand education materials at a high school level or above? Yes    Patient's primary language is  English     Is the patient high risk? No    SOCIAL DETERMINANTS OF HEALTH     At the [facility], we have learned that life circumstances - like trouble affording food, housing, utilities, or transportation can affect the health of many of our patients.   That is why we wanted to ask: are you currently experiencing any life circumstances that are negatively impacting your health and/or quality of life? Patient declined to answer    Social Determinants of Health     Financial Resource Strain: Low Risk  (07/31/2023)    Overall Financial Resource Strain (CARDIA)     Difficulty of Paying Living Expenses: Not hard at all   Internet Connectivity: Not on file   Food Insecurity: No Food Insecurity (07/31/2023)    Hunger Vital Sign     Worried About Running Out of Food in the Last Year: Never true     Ran Out of Food in the Last Year: Never true   Tobacco Use: Low Risk  (07/31/2023)    Patient History  Smoking Tobacco Use: Never     Smokeless Tobacco Use: Never     Passive Exposure: Never   Housing/Utilities: Low Risk  (07/31/2023)    Housing/Utilities     Within the past 12 months, have you ever stayed: outside, in a car, in a tent, in an overnight shelter, or temporarily in someone else's home (i.e. couch-surfing)?: No     Are you worried about losing your housing?: No     Within the past 12 months, have you been unable to get utilities (heat, electricity) when it was really needed?: No   Alcohol Use: Not At Risk (07/31/2023)    Alcohol Use     How often do you have a drink containing alcohol?: Never     How many drinks containing alcohol do you have on a typical day when you are drinking?: 1 - 2     How often do you have 5 or more drinks on one occasion?: Never   Transportation Needs: Unknown (07/31/2023)    PRAPARE - Therapist, art (Medical): Not on file     Lack of Transportation (Non-Medical): No   Substance Use: Low Risk  (07/31/2023)    Substance Use     Taken prescription drugs for non-medical reasons: Never     Taken illegal drugs: Never     Patient indicated they have taken drugs in the past year for non-medical reasons: Yes, [positive answer(s)]: Not on file   Health Literacy: Not on file   Physical Activity: Not on file   Interpersonal Safety: Unknown (08/02/2023)    Interpersonal Safety     Unsafe Where You Currently Live: Not on file     Physically Hurt by Anyone: Not on file     Abused by Anyone: Not on file   Stress: Not on file   Intimate Partner Violence: Unknown (11/21/2022)    Received from [facility]    HITS     Physically Hurt: Not on file     Insult or Talk Down To: Not on file     Threaten Physical Harm: Not on file     Scream or Curse: Not on file   Depression: Not at risk (02/01/2023)    PHQ-2     PHQ-2 Score: 2   Social Connections: Unknown (11/21/2022)    Received from [employer]    Social Network     Social Network: Not on file       Would you be willing to receive help with any of the needs that you have identified today? Not applicable       Elnora Morrison, PharmD  [facility] Specialty Pharmacist

## 2023-08-02 NOTE — Unmapped (Signed)
[facility] Specialty Medication Onboarding    Specialty Medication: DUPIXENT PEN 300 mg/2 mL Pnij (dupilumab)  Prior Authorization: Approved   Financial Assistance: No - copay  <$25  Final Copay/Day Supply: $0 / 14 loading and 28 maintenance.    Insurance Restrictions: Yes - max 1 month supply     Notes to Pharmacist:    Credit Card on File: not applicable    The triage team has completed the benefits investigation and has determined that the patient is able to fill this medication at [facility]. Please contact the patient to complete the onboarding or follow up with the prescribing physician as needed.

## 2023-08-13 NOTE — Unmapped (Signed)
[facility] Clinical Assessment & Refill Coordination Note    Ryan Davenport, DOB: 20-Nov-1961  Phone: [phone] (home)     All above HIPAA information was verified with patient.     Was a Nurse, learning disability used for this call? No    Specialty Medication(s):   Inflammatory Disorders: Dupixent     Current Outpatient Medications   Medication Sig Dispense Refill    atorvastatin (LIPITOR) 40 MG tablet Take 1 tablet (40 mg total) by mouth daily. 90 tablet 3    betamethasone dipropionate 0.05 % cream Apply 1 Application topically two (2) times a day.      BIKTARVY 50-200-25 mg tablet Take 1 tablet by mouth daily. 90 tablet 3    [START ON 12/14/2023] colchicine (COLCRYS) 0.6 mg tablet TAKE 1 TABLET BY MOUTH DAILY AS NEEDED FOR GOUT 90 tablet 3    dupilumab 300 mg/2 mL PnIj Inject the contents of 2 pens (600mg ) under the skin for loading dose 4 mL 0    dupilumab 300 mg/2 mL PnIj Inject the contents of 1 pen (300 mg) under the skin every fourteen (14) days. For maintenance 4 mL 11    ELIQUIS 5 mg Tab Take 1 tablet (5 mg total) by mouth two (2) times a day.      empagliflozin (JARDIANCE) 25 mg tablet Take 1 tablet (25 mg total) by mouth daily. 90 tablet 3    furosemide (LASIX) 20 MG tablet Take 1 tablet (20 mg total) by mouth daily.      hydrocortisone 2.5 % cream Apply 1 application. topically two (2) times a day. 453 g 3    hydrocortisone 2.5 % ointment Apply 1 Application topically two (2) times a day. To active areas of the face until smooth 454 g 5    hydrOXYzine (ATARAX) 25 MG tablet Take 1 tablet (25 mg total) by mouth every six (6) hours as needed.      lisinopril (PRINIVIL,ZESTRIL) 10 MG tablet Take 1 tablet (10 mg total) by mouth daily. 90 tablet 3    methocarbamoL (ROBAXIN) 750 MG tablet 750mg  4x daily prn muscle spasms 60 tablet 2    metoprolol succinate (TOPROL-XL) 25 MG 24 hr tablet Take 1 tablet (25 mg total) by mouth daily. 90 tablet 3    omeprazole (PRILOSEC) 40 MG capsule TAKE 1 CAPSULE(40 MG) BY MOUTH DAILY 90 capsule 3    semaglutide (RYBELSUS) 14 mg Tab Take 14 mg by mouth daily. 30 tablet 11    sertraline (ZOLOFT) 50 MG tablet Take 1 tablet (50 mg total) by mouth daily. 90 tablet 3    triamcinolone (KENALOG) 0.1 % cream Apply topically two (2) times a day. (Patient not taking: Reported on 07/25/2023) 453 g 3    triamcinolone (KENALOG) 0.1 % ointment Apply topically two (2) times a day. To active areas of trunk and extremities until smooth 454 g 5     No current facility-administered medications for this visit.        Changes to medications: Ryan Davenport reports no changes at this time.    Allergies   Allergen Reactions    Latex Rash    Shellfish Containing Products      Other reaction(s): SHORTNESS OF BREATH    Metformin Nausea Only     Both IR and XR formulations    Both IR and XR formulations   Both IR and XR formulations    Both IR and XR formulations      Both  IR and XR formulations Both IR and XR formulations    Penicillins Rash       Changes to allergies: No    SPECIALTY MEDICATION ADHERENCE     Dupixent 300mg /56mL  : 0 doses of medicine on hand      Medication Adherence    Patient reported X missed doses in the last month: 0  Specialty Medication: Dupixent 300mg /49mL q14days  Informant: patient  Confirmed plan for next specialty medication refill: delivery by pharmacy  Refills needed for supportive medications: not needed          Specialty medication(s) dose(s) confirmed: Regimen is correct and unchanged.     Are there any concerns with adherence? No    Adherence counseling provided? Not needed    CLINICAL MANAGEMENT AND INTERVENTION      Clinical Benefit Assessment:    Do you feel the medicine is effective or helping your condition? Yes    Clinical Benefit counseling provided? Not needed    Adverse Effects Assessment:    Are you experiencing any side effects? No    Are you experiencing difficulty administering your medicine? No    Quality of Life Assessment:    Quality of Life Rheumatology  Oncology  Dermatology  1. What impact has your specialty medication had on the symptoms of your skin condition (i.e. itchiness, soreness, stinging)?: Minimal  2. What impact has your specialty medication had on your comfort level with your skin?: Minimal  Cystic Fibrosis          How many days over the past month did your Atopic Dermatitis  keep you from your normal activities? For example, brushing your teeth or getting up in the morning. Patient declined to answer    Have you discussed this with your provider? Not needed    Acute Infection Status:    Acute infections noted within Epic:  No active infections  Patient reported infection: None    Therapy Appropriateness:    Is therapy appropriate based on current medication list, adverse reactions, adherence, clinical benefit and progress toward achieving therapeutic goals? Yes, therapy is appropriate and should be continued     DISEASE/MEDICATION-SPECIFIC INFORMATION      For patients on injectable medications: Patient currently has 0 doses left.  Next injection is scheduled for 08/20/2023.    Chronic Inflammatory Diseases: Have you experienced any flares in the last month? No  Has this been reported to your provider? Not applicable    PATIENT SPECIFIC NEEDS     Does the patient have any physical, cognitive, or cultural barriers? No    Is the patient high risk? No    Did the patient require a clinical intervention? No    Does the patient require physician intervention or other additional services (i.e., nutrition, smoking cessation, social work)? No    SOCIAL DETERMINANTS OF HEALTH     At the [facility], we have learned that life circumstances - like trouble affording food, housing, utilities, or transportation can affect the health of many of our patients.   That is why we wanted to ask: are you currently experiencing any life circumstances that are negatively impacting your health and/or quality of life? Patient declined to answer    Social Determinants of Health     Financial Resource Strain: Low Risk  (07/31/2023)    Overall Financial Resource Strain (CARDIA)     Difficulty of Paying Living Expenses: Not hard at all   Internet Connectivity: Not on file  Food Insecurity: No Food Insecurity (07/31/2023)    Hunger Vital Sign     Worried About Running Out of Food in the Last Year: Never true     Ran Out of Food in the Last Year: Never true   Tobacco Use: Low Risk  (07/31/2023)    Patient History     Smoking Tobacco Use: Never     Smokeless Tobacco Use: Never     Passive Exposure: Never   Housing/Utilities: Low Risk  (07/31/2023)    Housing/Utilities     Within the past 12 months, have you ever stayed: outside, in a car, in a tent, in an overnight shelter, or temporarily in someone else's home (i.e. couch-surfing)?: No     Are you worried about losing your housing?: No     Within the past 12 months, have you been unable to get utilities (heat, electricity) when it was really needed?: No   Alcohol Use: Not At Risk (07/31/2023)    Alcohol Use     How often do you have a drink containing alcohol?: Never     How many drinks containing alcohol do you have on a typical day when you are drinking?: 1 - 2     How often do you have 5 or more drinks on one occasion?: Never   Transportation Needs: Unknown (07/31/2023)    PRAPARE - Therapist, art (Medical): Not on file     Lack of Transportation (Non-Medical): No   Substance Use: Low Risk  (07/31/2023)    Substance Use     Taken prescription drugs for non-medical reasons: Never     Taken illegal drugs: Never     Patient indicated they have taken drugs in the past year for non-medical reasons: Yes, [positive answer(s)]: Not on file   Health Literacy: Not on file   Physical Activity: Not on file   Interpersonal Safety: Unknown (08/13/2023)    Interpersonal Safety     Unsafe Where You Currently Live: Not on file     Physically Hurt by Anyone: Not on file     Abused by Anyone: Not on file   Stress: Not on file   Intimate Partner Violence: Unknown (11/21/2022)    Received from [facility]    HITS     Physically Hurt: Not on file     Insult or Talk Down To: Not on file     Threaten Physical Harm: Not on file     Scream or Curse: Not on file   Depression: Not at risk (02/01/2023)    PHQ-2     PHQ-2 Score: 2   Social Connections: Unknown (11/21/2022)    Received from [employer]    Social Network     Social Network: Not on file       Would you be willing to receive help with any of the needs that you have identified today? Not applicable       SHIPPING     Specialty Medication(s) to be Shipped:   Inflammatory Disorders: Dupixent    Other medication(s) to be shipped: No additional medications requested for fill at this time     Changes to insurance: No    Delivery Scheduled: Yes, Expected medication delivery date: 08/15/2023.     Medication will be delivered via UPS to the confirmed prescription address in [facility].    The patient will receive a drug information handout for each medication shipped and additional FDA Medication Guides  as required.  Verified that patient has previously received a Conservation officer, historic buildings and a Surveyor, mining.    The patient or caregiver noted above participated in the development of this care plan and knows that they can request review of or adjustments to the care plan at any time.      All of the patient's questions and concerns have been addressed.    Elnora Morrison, PharmD   [facility] Specialty Pharmacist

## 2023-08-14 DIAGNOSIS — L209 Atopic dermatitis, unspecified: Principal | ICD-10-CM

## 2023-08-14 MED ORDER — DUPIXENT 300 MG/2 ML SUBCUTANEOUS PEN INJECTOR
0 refills | 0.00000 days
Start: 2023-08-14 — End: ?

## 2023-08-14 MED ORDER — DUPIXENT 300 MG/2 ML SUBCUTANEOUS SYRINGE
SUBCUTANEOUS | 0 refills | 0.00000 days | Status: CP
Start: 2023-08-14 — End: ?
  Filled 2023-08-28: qty 4, 28d supply, fill #0

## 2023-08-14 NOTE — Unmapped (Signed)
Addended by: Sharene Butters E on: 08/14/2023 04:00 PM     Modules accepted: Orders

## 2023-08-14 NOTE — Unmapped (Signed)
I spoke with Ryan Davenport in regards to rescheduling his shipment of Dupixent and it has been rescheduled to deliver on 08/20/23 via UPS.

## 2023-08-14 NOTE — Unmapped (Signed)
Ryan Davenport 's DUPIXENT PEN 300 mg/2 mL Pnij (dupilumab) shipment will be delayed as a result of the medication is too soon to refill until 08/16/23.     I have reached out to the patient  at [phone]  and left a voicemail message.  We will wait for a call back from the patient to reschedule the delivery.  We have not confirmed the new delivery date.

## 2023-08-22 NOTE — Unmapped (Signed)
Nakota Lonia Blood 's DUPIXENT SYRINGE 300 mg/2 mL Syrg injection (dupilumab) shipment will be sent out as a result of the medication is no longer refill too soon.      I have spoken with the patient  via incoming phone call and communicated the delivery change. We will reschedule the medication for the delivery date that the patient agreed upon.  We have confirmed the delivery date as 9/4, via same day courier.

## 2023-09-05 ENCOUNTER — Ambulatory Visit (INDEPENDENT_AMBULATORY_CARE_PROVIDER_SITE_OTHER): Payer: 59 | Admitting: Podiatry

## 2023-09-05 DIAGNOSIS — Z91199 Patient's noncompliance with other medical treatment and regimen due to unspecified reason: Secondary | ICD-10-CM

## 2023-09-08 NOTE — Progress Notes (Signed)
Patient was no-show for appointment today 

## 2023-09-12 ENCOUNTER — Ambulatory Visit
Admit: 2023-09-12 | Discharge: 2023-09-12 | Discharge status: Against Medical Advice | Payer: MEDICARE | Attending: Family Medicine

## 2023-09-12 ENCOUNTER — Emergency Department
Admit: 2023-09-12 | Discharge: 2023-09-12 | Discharge status: Against Medical Advice | Payer: MEDICARE | Attending: Family Medicine

## 2023-09-12 DIAGNOSIS — M79605 Pain in left leg: Principal | ICD-10-CM

## 2023-09-12 LAB — COMPREHENSIVE METABOLIC PANEL
ALBUMIN: 3.9 g/dL (ref 3.4–5.0)
ALKALINE PHOSPHATASE: 101 U/L (ref 38–126)
ALT (SGPT): 19 U/L (ref <50)
ANION GAP: 11 mmol/L (ref 7–15)
AST (SGOT): 37 U/L (ref 19–55)
BILIRUBIN TOTAL: 0.4 mg/dL (ref 0.1–1.2)
BLOOD UREA NITROGEN: 10 mg/dL (ref 7–21)
BUN / CREAT RATIO: 9
CALCIUM: 8.1 mg/dL — ABNORMAL LOW (ref 8.5–10.2)
CHLORIDE: 106 mmol/L (ref 98–107)
CO2: 21 mmol/L — ABNORMAL LOW (ref 22.0–32.0)
CREATININE: 1.1 mg/dL (ref 0.70–1.30)
EGFR CKD-EPI (2021) MALE: 77 mL/min/{1.73_m2} (ref >=60)
GLUCOSE RANDOM: 117 mg/dL — ABNORMAL HIGH (ref 74–106)
POTASSIUM: 4.1 mmol/L (ref 3.5–5.0)
PROTEIN TOTAL: 8.9 g/dL — ABNORMAL HIGH (ref 6.5–8.3)
SODIUM: 138 mmol/L (ref 135–145)

## 2023-09-12 LAB — CBC W/ AUTO DIFF
BASOPHILS ABSOLUTE COUNT: 0 10*9/L (ref 0.0–0.1)
BASOPHILS RELATIVE PERCENT: 0.6 %
EOSINOPHILS ABSOLUTE COUNT: 1 10*9/L — ABNORMAL HIGH (ref 0.0–0.5)
EOSINOPHILS RELATIVE PERCENT: 12.1 %
HEMATOCRIT: 34.7 % — ABNORMAL LOW (ref 39.0–48.0)
HEMOGLOBIN: 11.1 g/dL — ABNORMAL LOW (ref 12.9–16.5)
LYMPHOCYTES ABSOLUTE COUNT: 1.6 10*9/L (ref 1.1–3.6)
LYMPHOCYTES RELATIVE PERCENT: 18.4 %
MEAN CORPUSCULAR HEMOGLOBIN CONC: 31.9 g/dL — ABNORMAL LOW (ref 32.0–36.0)
MEAN CORPUSCULAR HEMOGLOBIN: 26.5 pg (ref 25.9–32.4)
MEAN CORPUSCULAR VOLUME: 83.2 fL (ref 77.6–95.7)
MEAN PLATELET VOLUME: 7 fL (ref 6.8–10.7)
MONOCYTES ABSOLUTE COUNT: 0.7 10*9/L (ref 0.3–0.8)
MONOCYTES RELATIVE PERCENT: 7.9 %
NEUTROPHILS ABSOLUTE COUNT: 5.2 10*9/L (ref 1.8–7.8)
NEUTROPHILS RELATIVE PERCENT: 61 %
NUCLEATED RED BLOOD CELLS: 0 /100{WBCs} (ref <=4)
PLATELET COUNT: 346 10*9/L (ref 150–450)
RED BLOOD CELL COUNT: 4.17 10*12/L — ABNORMAL LOW (ref 4.26–5.60)
RED CELL DISTRIBUTION WIDTH: 16 % — ABNORMAL HIGH (ref 12.2–15.2)
WBC ADJUSTED: 8.4 10*9/L (ref 3.6–11.2)

## 2023-09-12 LAB — TROPONIN I: TROPONIN I: <0.034 ng/mL (ref <0.034)

## 2023-09-12 LAB — D-DIMER, QUANTITATIVE: D-DIMER QUANTITATIVE (CW,ML,HL,HS,CH,JS,JC,RX,RH): 2172 ng{FEU}/mL — ABNORMAL HIGH (ref <=500)

## 2023-09-12 NOTE — Unmapped (Signed)
Left leg/foot swelling and pain that radiates into groin. Patient thinks this started two weeks following a fall. Reports slipping on clothes hanger. No LOC.     Heat rate 122 in triage. Denies chest pain.

## 2023-09-12 NOTE — Unmapped (Signed)
[facility]  Emergency Department Provider Note    ED Clinical Impression      Final diagnoses:   Left leg pain (Primary)        Impression, Medical Decision Making, and Progress Notes      Initial Impression/MDM    Ryan Davenport is a 62 y.o. male with PMHx of PE, DVT, CAD, HIV, GERD, HLD, HTN, MI, and chronic cardiopulmonary disease presents to the ED for evaluation of left leg swelling, which began 2 weeks ago.    Pt was tachycardic (122 bpm) upon triage, but vitals were otherwise WNL.     Physical exam shows an alert and oriented adult male, resting comfortably in bed, speaking full sentences and in no acute distress. Heart RRR and lungs were clear. Thickened, rough skin on BLE with no erythema or obvious swelling noted. Tenderness to palpation in the medial left postero-medial thigh. Equal warmth and equal pulses in the BLE.     Differential diagnosis includes, but is not limited to: DVT, arthritis, infection, and strain vs sprain vs fracture given pt's recent fall.     Initial investigations includes: EKG, CMP, CBC, D-Dimer, and troponin taken in triage. D-Dimer elevated to 2,172; will proceed with left PVL. CBC shows unchanged anemia (RBC 4.17, HGB 11.1). CMP relatively WNL. Troponin negative.     ED Course  12:10 PM   Per nursing note, pt eloped without informing us of his leaving. I was unable to meet him before he left to discuss risks and return precautions. I believe he had adequate capacity to make that decsion.    Historians interviewed: Pt  Studies independently reviewed:   Consultants: None   Prior records reviewed: 07/26/2023 progress note.   Testing considered: PVL - pt left prior to it being performed  SDOH affecting care: None   Meds given or considered: anticoagulaton pending PVL  Disposition and planning: eloped    History      Reason for Visit  Leg Pain and Leg Swelling      HPI   Ryan Davenport is a 63 y.o. male with PMHx of PE, DVT, CAD, HIV, GERD, HLD, HTN, MI, and chronic cardiopulmonary disease presents to the ED for evaluation of left leg swelling, which began 2 weeks ago. Pt reports that he had a fall a couple weeks ago when he tripped over a clothes hanger, which is when he thinks his symptoms started. Pt fell onto the ground face-first and was dizzy afterwards, but was able to get up and self ambulate. He says the day before the fall, he had self-administered an injection of Dupixent to his leg for his eczema. He states a knot popped up on his leg after this.     Pt reports that since the fall, he has had pain and swelling to the medial side of his left thigh which radiates up to his groin. Currently, he denies chest pain, SOB, fever, chills, or any other symptoms. No pain in his RLE.     Outside Historian(s)  None     External Records Reviewed  Reviewed 07/31/2023 progress note from internal med for PMHx. He is on Jaridance 25 mg daily and Rybelsus 14 mg daily for T2DM. He takes Eliquis BID for PE.      Past Medical History:   Diagnosis Date    Asthma     Asymptomatic human immunodeficiency virus (HIV) infection status (CMS-HCC) 04/09/2013    Diagnosed in 2005 with a CD4 of 32  and on at least one prior regimen on a study, then switched to Truvada with ritonavir boosted atazanavir. Changed to Truvada with boosted darunavir secondary to reflux symptoms. Has remained undetectable for several years with CD4 count greater than 500.      CAD (coronary artery disease)     Depression     Diabetes mellitus (CMS-HCC)     DVT (deep venous thrombosis) (CMS-HCC)     Eczema     Enlargement of liver     GERD (gastroesophageal reflux disease)     HIV (human immunodeficiency virus infection) (CMS-HCC)     Hyperlipidemia     Hypertension     Myocardial infarction (CMS-HCC)     Obesity     Pulmonary embolism (CMS-HCC) 2012    Sleep apnea        Patient Active Problem List   Diagnosis    Pulmonary embolism 1/12, long-term anticoag per hematology    Allergy to latex    Chronic cardiopulmonary disease (CMS-HCC)    Chronic diastolic heart failure (CMS-HCC)    Cytomegaloviral disease (CMS-HCC)    Dermatitis    Dysplasia of anus    Enthesopathy of knee    Esophageal reflux    Gout    Horseshoe tear of retina    Human immunodeficiency virus (HIV) disease (CMS-HCC)    Essential hypertension    Molluscum contagiosum    Mononeuritis    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 infarction    Convulsions (CMS-HCC)    Hereditary and idiopathic peripheral neuropathy    Inguinal hernia    Early syphilis, secondary syphilis    Coronary atherosclerosis (RAF-HCC)    Acid reflux (RAF-HCC)    Chronic eczema    Depression    Allergic rhinitis    Uncontrolled type 2 diabetes mellitus    Diastasis recti    AIDS (CMS-HCC)    Obesity (BMI 30.0-34.9)    Disc disease, degenerative, lumbar or lumbosacral    'Light-for-dates' infant with signs of fetal malnutrition    Hyperlipidemia    Syncope    Dehydration    Diarrhea of presumed infectious origin    Acute renal failure (ARF) (CMS-HCC)       Past Surgical History:   Procedure Laterality Date    PR COLONOSCOPY FLX DX W/COLLJ SPEC WHEN PFRMD N/A 11/11/2013    Procedure: COLONOSCOPY, FLEXIBLE, PROXIMAL TO SPLENIC FLEXURE; DIAGNOSTIC, W/WO COLLECTION SPECIMEN BY BRUSH OR WASH;  Surgeon: Romero Belling, MD;  [facility];  Service: Gastroenterology    PR UPPER GI ENDOSCOPY,DIAGNOSIS N/A 04/01/2019    Procedure: UGI ENDO, INCLUDE ESOPHAGUS, STOMACH, & DUODENUM &/OR JEJUNUM; DX W/WO COLLECTION SPECIMN, BY BRUSH OR WASH;  Surgeon: Leland Her, MD;  [facility];  Service: Gastroenterology    SKIN BIOPSY         No current facility-administered medications for this encounter.    Current Outpatient Medications:     atorvastatin (LIPITOR) 40 MG tablet, Take 1 tablet (40 mg total) by mouth daily., Disp: 90 tablet, Rfl: 3    betamethasone dipropionate 0.05 % cream, Apply 1 Application topically two (2) times a day., Disp: , Rfl:     BIKTARVY 50-200-25 mg tablet, Take 1 tablet by mouth daily., Disp: 90 tablet, Rfl: 3    [START ON 12/14/2023] colchicine (COLCRYS) 0.6 mg tablet, TAKE 1 TABLET BY MOUTH DAILY AS NEEDED FOR GOUT, Disp: 90 tablet, Rfl: 3    dupilumab (DUPIXENT SYRINGE) 300 mg/2 mL Syrg injection, Inject 2  mL (300 mg total) under the skin every fourteen (14) days. For maintenance dose, Disp: 4 mL, Rfl: 11    dupilumab (DUPIXENT SYRINGE) 300 mg/2 mL Syrg injection, Inject the contents of 2 pens (600 mg) under the skin once for loading dose, Disp: 4 mL, Rfl: 0    ELIQUIS 5 mg Tab, Take 1 tablet (5 mg total) by mouth two (2) times a day., Disp: , Rfl:     empagliflozin (JARDIANCE) 25 mg tablet, Take 1 tablet (25 mg total) by mouth daily., Disp: 90 tablet, Rfl: 3    furosemide (LASIX) 20 MG tablet, Take 1 tablet (20 mg total) by mouth daily., Disp: , Rfl:     hydrocortisone 2.5 % cream, Apply 1 application. topically two (2) times a day., Disp: 453 g, Rfl: 3    hydrocortisone 2.5 % ointment, Apply 1 Application topically two (2) times a day. To active areas of the face until smooth, Disp: 454 g, Rfl: 5    hydrOXYzine (ATARAX) 25 MG tablet, Take 1 tablet (25 mg total) by mouth every six (6) hours as needed., Disp: , Rfl:     lisinopril (PRINIVIL,ZESTRIL) 10 MG tablet, Take 1 tablet (10 mg total) by mouth daily., Disp: 90 tablet, Rfl: 3    methocarbamoL (ROBAXIN) 750 MG tablet, 750mg  4x daily prn muscle spasms, Disp: 60 tablet, Rfl: 2    metoprolol succinate (TOPROL-XL) 25 MG 24 hr tablet, Take 1 tablet (25 mg total) by mouth daily., Disp: 90 tablet, Rfl: 3    omeprazole (PRILOSEC) 40 MG capsule, TAKE 1 CAPSULE(40 MG) BY MOUTH DAILY, Disp: 90 capsule, Rfl: 3    semaglutide (RYBELSUS) 14 mg Tab, Take 14 mg by mouth daily., Disp: 30 tablet, Rfl: 11    sertraline (ZOLOFT) 50 MG tablet, Take 1 tablet (50 mg total) by mouth daily., Disp: 90 tablet, Rfl: 3    triamcinolone (KENALOG) 0.1 % cream, Apply topically two (2) times a day. (Patient not taking: Reported on 07/25/2023), Disp: 453 g, Rfl: 3    triamcinolone (KENALOG) 0.1 % ointment, Apply topically two (2) times a day. To active areas of trunk and extremities until smooth, Disp: 454 g, Rfl: 5    Allergies  Latex, Shellfish containing products, Metformin, and Penicillins    Family History   Problem Relation Age of Onset    Diabetes Mother     Heart disease Mother     Hypertension Mother     Arthritis Mother     Aneurysm Father     Skin cancer Neg Hx     Melanoma Neg Hx     Basal cell carcinoma Neg Hx     Squamous cell carcinoma Neg Hx        Social History     Tobacco Use    Smoking status: Never     Passive exposure: Never    Smokeless tobacco: Never   Vaping Use    Vaping status: Never Used   Substance Use Topics    Alcohol use: No     Alcohol/week: 0.0 standard drinks of alcohol    Drug use: No         Physical Exam     ED Triage Vitals [09/12/23 0902]   Enc Vitals Group      BP 137/65      Heart Rate 122      SpO2 Pulse       Resp 18      Temp 37.3 ??C (99.1 ??F)  Temp Source Temporal      SpO2 98 %      Weight (!) 108 kg (238 lb)      Height 1.829 m (6')        Constitutional: Alert and oriented. Well appearing and in no distress.    HEENT  Head: Normocephalic and atraumatic.  Ears: Normal external ears   Eyes: Conjunctivae are normal. EOMI.   Nose: No congestion.  Mouth/Throat: Normal voice. Mucous membranes are moist. Normal oropharynx.  Cardiovascular: Normal rate, regular rhythm. No murmur.  Good distal peripheral pulses.  Respiratory: CTAB with no rales, rhonchi, or wheeze. Good air movement.  Gastrointestinal: Soft and nontender. No rebound, guarding, or rigidity.   Musculoskeletal: Normal range of motion in all extremities. No extremity edema.  Neurologic: Normal speech and language. No gross focal neurologic deficits are appreciated. No tremors or involuntary movements noted.  Skin: Thickened, rough skin on BLE with no erythema or obvious swelling noted. Tenderness to palpation in the medial left postero-medial thigh. Equal warmth and equal pulses in the BLE.   Psychiatric: Mood and affect are normal. Speech and behavior are normal.    Labs     Results for orders placed or performed during the hospital encounter of 09/12/23   Comprehensive metabolic panel   Result Value Ref Range    Sodium 138 135 - 145 mmol/L    Potassium 4.1 3.5 - 5.0 mmol/L    Chloride 106 98 - 107 mmol/L    CO2 21.0 (L) 22.0 - 32.0 mmol/L    Anion Gap 11 7 - 15 mmol/L    BUN 10 7 - 21 mg/dL    Creatinine 3.01 6.01 - 1.30 mg/dL    BUN/Creatinine Ratio 9     eGFR CKD-EPI (2021) Male 77 >=60 mL/min/1.40m2    Glucose 117 (H) 74 - 106 mg/dL    Calcium 8.1 (L) 8.5 - 10.2 mg/dL    Albumin 3.9 3.4 - 5.0 g/dL    Total Protein 8.9 (H) 6.5 - 8.3 g/dL    Total Bilirubin 0.4 0.1 - 1.2 mg/dL    AST 37 19 - 55 U/L    ALT 19 <50 U/L    Alkaline Phosphatase 101 38 - 126 U/L   D-Dimer, Quantitative   Result Value Ref Range    D-Dimer 2,172 (H) <=500 ng/mL FEU   Troponin I   Result Value Ref Range    Troponin I <0.034 <0.034 ng/mL   ECG 12 Lead   Result Value Ref Range    EKG Systolic BP  mmHg    EKG Diastolic BP  mmHg    EKG Ventricular Rate 123 BPM    EKG Atrial Rate 123 BPM    EKG P-R Interval 142 ms    EKG QRS Duration 88 ms    EKG Q-T Interval 328 ms    EKG QTC Calculation 469 ms    EKG Calculated P Axis 64 degrees    EKG Calculated R Axis -33 degrees    EKG Calculated T Axis 56 degrees    QTC Fredericia 416 ms   CBC w/ Differential   Result Value Ref Range    WBC 8.4 3.6 - 11.2 10*9/L    RBC 4.17 (L) 4.26 - 5.60 10*12/L    HGB 11.1 (L) 12.9 - 16.5 g/dL    HCT 09.3 (L) 23.5 - 48.0 %    MCV 83.2 77.6 - 95.7 fL    MCH 26.5 25.9 - 32.4 pg  MCHC 31.9 (L) 32.0 - 36.0 g/dL    RDW 16.1 (H) 09.6 - 15.2 %    MPV 7.0 6.8 - 10.7 fL    Platelet 346 150 - 450 10*9/L    nRBC 0 <=4 /100 WBCs    Neutrophils % 61.0 %    Lymphocytes % 18.4 %    Monocytes % 7.9 %    Eosinophils % 12.1 %    Basophils % 0.6 %    Absolute Neutrophils 5.2 1.8 - 7.8 10*9/L    Absolute Lymphocytes 1.6 1.1 - 3.6 10*9/L    Absolute Monocytes 0.7 0.3 - 0.8 10*9/L    Absolute Eosinophils 1.0 (H) 0.0 - 0.5 10*9/L    Absolute Basophils 0.0 0.0 - 0.1 10*9/L       Radiology     No orders to display       Procedures and Critical Care     None    Attestations     Portions of this record have been created using Scientist, clinical (histocompatibility and immunogenetics). Dictation errors have been sought, but may not have been identified and corrected.    The chart was initially created by Clarene Essex using scribe services on September 12, 2023 11:23 AM, on behalf of Nino Glow, MD.    Documentation assistance was provided by the scribe in my presence. The documentation recorded by the scribe has been reviewed by me, amended with [employer], and accurately reflects the services I personally performed.          Johney Maine, MD  09/12/23 1726

## 2023-09-25 ENCOUNTER — Ambulatory Visit: Admit: 2023-09-25 | Discharge: 2023-09-26 | Payer: MEDICARE

## 2023-09-25 DIAGNOSIS — L209 Atopic dermatitis, unspecified: Principal | ICD-10-CM

## 2023-09-25 DIAGNOSIS — R600 Localized edema: Principal | ICD-10-CM

## 2023-09-25 MED ORDER — TRIAMCINOLONE ACETONIDE 0.1 % TOPICAL OINTMENT
Freq: Two times a day (BID) | TOPICAL | 5 refills | 0.00000 days | Status: CP
Start: 2023-09-25 — End: 2024-09-24

## 2023-09-25 MED ORDER — CETIRIZINE 10 MG TABLET
ORAL_TABLET | Freq: Two times a day (BID) | ORAL | 11 refills | 30.00000 days | Status: CP
Start: 2023-09-25 — End: 2024-09-24

## 2023-09-25 MED ORDER — HYDROCORTISONE 2.5 % TOPICAL OINTMENT
Freq: Two times a day (BID) | TOPICAL | 5 refills | 0.00000 days | Status: CP
Start: 2023-09-25 — End: 2024-09-24

## 2023-09-25 NOTE — Unmapped (Signed)
[facility] releases most results to you as soon as they are available. Therefore, you may see some results before we do. Please give [place] 3 business days to review the tests and contact you by phone or through MyChart. If you are concerned that some results may be upsetting or confusing, you may wish to wait until we contact you before looking at the report in MyChart.   If you have an urgent question, you can call our clinic. MyChart should not be used for urgent issues. Otherwise, we prefer that you wait 3 business days for [place] to contact you.    [facility] Dermatology Clinical Team

## 2023-09-25 NOTE — Unmapped (Signed)
Dermatology Note     Assessment and Plan:      Atopic dermatitis: chronic, severe flareup in setting of not having dupixent for >1 yr, patient self-discontinued dupixent due to leg edema after injection, possibly 2/2 injection site reaction.  - Patient flaring in setting of stopping medications. Patient has been following with Ryan Davenport for the past 15 years and exam is consistent with atopic dermatitis. Review of chart shows that patient had previously been on methotrexate as well as dupixent, and had significant improvement while on dupixent.   -Discussed that he likely had an injection site reaction from the dupixent but he should be safe to inject it in other sides.   - Continue with dupixent 300mg  every 14 days.    - Continue triamcinolone 0.1% ointment daily for full body wet wraps. This will be applied to the neck, front and back of the trunk, arms and legs.   - Continue hydrocortisone 2.5% ointment to be applied to the face, intertriginous areas (axilla, groin, intergluteal cleft, etc.)   - Continue cetirizine 10mg  BID  - Discussed instructions for modified wet wraps by applying triamcinolone shortly after showers and wearing long pajamas   - Encouraged frequent emollient use. Provided list of recommended creams.   -Encouraged patient to follow up with PCP for leg edema  -Discussed injection technique with patient and that he should use abdominal skin or anterior thigh rather than medial thigh.    - Duplex US of L leg pending to rule out DVT      The patient was advised to call for an appointment should any new, changing, or symptomatic lesions develop.       The patient was advised to call for an appointment should any new, changing, or symptomatic lesions develop.     RTC: Return in about 3 months (around 12/26/2023) for inflammatory clininc Ryan Davenport. or sooner as needed   _________________________________________________________________      Chief Complaint     Chief Complaint   Patient presents with Follow-up     2 month for atopic derm, about the same since last appt       HPI     Ryan Davenport is a 63 y.o. male who presents as a returning patient (last seen 07/25/2023) to Dermatology for follow up of atopic dermatitis.    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     ROS: Other than symptoms mentioned in the HPI, no fevers, chill, or other skin complaints.     Physical Examination     GENERAL: Well-appearing male in no acute distress, resting comfortably.  NEURO: Alert and oriented, answers questions appropriately  SKIN (Sun exposed Exam): Per patient request, examination of the face, neck, scalp, bilateral upper extremities, palms, and fingernails was performed  - L leg edema  - Diffuse xerosis and scale with hyperlinear palms, Dennie Morgan folds, somewhat ill-defined  erythematous plaques  with evidence of lichenification and pigment alteration, with excoriation on the trunk and extremities favoring flexural areas and the eyelids    BSA: 20    EASI: (3)     All areas not commented on are within normal limits or unremarkable      (Approved Template 09/05/2020)

## 2023-09-26 NOTE — Unmapped (Signed)
Called and left vm to call back to schedule   Return in about 3 months (around 12/26/2023) for inflammatory clininc Dr. Dub Mikes.

## 2023-09-30 ENCOUNTER — Ambulatory Visit: Admit: 2023-09-30 | Discharge: 2023-10-01 | Payer: MEDICARE

## 2023-09-30 NOTE — Unmapped (Signed)
The [facility] has made a second and final attempt to reach this patient to refill the following medication:Dupixent.      We have left voicemails on the following phone numbers: [phone], have been unable to leave messages on the following phone numbers: [phone], and have sent a text message to the following phone numbers: [phone] .    Dates contacted: 09/18/2023   09/30/2023  Last scheduled delivery: 08/28/2023    The patient may be at risk of non-compliance with this medication. The patient should call the [facility] at [phone]  Option 4, then Option 2: Dermatology, Gastroenterology, Rheumatology to refill medication.    Ryan Davenport

## 2023-10-01 NOTE — Unmapped (Signed)
Patient notified via PHONE that duplex US did not reveal thrombi. No further action is needed at this time.

## 2023-10-24 ENCOUNTER — Telehealth: Payer: Self-pay

## 2023-10-24 NOTE — Telephone Encounter (Signed)
Called [facility] to schedule overdue appointment, call could not be completed.   Sandie Ano, RN

## 2023-10-31 ENCOUNTER — Ambulatory Visit: Admit: 2023-10-31 | Discharge: 2023-11-01 | Payer: MEDICARE

## 2023-10-31 DIAGNOSIS — Z1211 Encounter for screening for malignant neoplasm of colon: Principal | ICD-10-CM

## 2023-10-31 DIAGNOSIS — I2699 Other pulmonary embolism without acute cor pulmonale: Principal | ICD-10-CM

## 2023-10-31 DIAGNOSIS — B20 Human immunodeficiency virus [HIV] disease: Principal | ICD-10-CM

## 2023-10-31 DIAGNOSIS — I10 Essential (primary) hypertension: Principal | ICD-10-CM

## 2023-10-31 DIAGNOSIS — M65332 Trigger finger, left middle finger: Principal | ICD-10-CM

## 2023-10-31 NOTE — Unmapped (Signed)
[facility] at [facility]     Reason for visit: Follow up    Questions / Concerns that need to be addressed: diabetes, colonoscopy and hand referrals changed to here instead of [facility]    Screening BP- 150/84   83        PTHomeBP     Diabetes:  Regularly checking blood sugars?: no  If yes, when? Complete log for past 7 days  Date Before Breakfast After Breakfast Before Lunch After Lunch Before Dinner After Dinner Before Bed                                                                                                                                   HCDM reviewed and updated in Epic:    We are working to make sure all of our patients??? wishes are updated in Epic and part of that is documenting a Environmental health practitioner for each patient  A Health Care Decision Maker is someone you choose who can make health care decisions for you if you are not able - who would you most want to do this for you????  is already up to date.    HCDM (patient stated preference): Ryan Davenport, Ryan Davenport - Mother - [phone]    BPAs completed:  PHQ9    Annual Screenings:     __________________________________________________________________________________________    SCREENINGS COMPLETED IN FLOWSHEETS      AUDIT       PHQ2       PHQ9  Thoughts that you would be better off dead, or of hurting yourself in some way: Not at all  PHQ-9 TOTAL SCORE: 2    GAD7       COPD Assessment       Falls Risk

## 2023-10-31 NOTE — Unmapped (Signed)
Jillene Bucks, DMSc, PA-C    Assessment/Plan:     Lab Results   Component Value Date    A1C 6.2 07/31/2023    A1C 6.7 09/26/2021    A1C 6.6 05/08/2021       1.  DM2-plan for A1c 3 months, continue Jardiance 25 mg daily and Rybelsus 14 mg daily.  No need to test home blood sugars right now.  2.  History of PE-tolerating Eliquis, plan for CBC, CMP in the future though patient reports he had blood work recently in [place] and I have asked him to bring the results to next visit for assessment.  3.  HIV-he recently moved from [place] back to [place] city and wants to transfer his ID care back to [facility], referral submitted.  Currently on Biktarvy   4.  Left third finger mobility concerns-suspect trigger finger, hand surgery referral submitted.  5.  Bilateral inguinal lymph nodes on recent PVL-reports lymph node went down after a course of doxycycline, monitor.  If lymphadenopathy returns, consider CT abdomen and pelvis with contrast as he had similar findings on PVL and 07/2021.  6.  Atopic dermatitis-currently followed by dermatology in [place] and plans to continue, currently on Dupixent though there are issues with delivery, he will contact pharmacy and or dermatology.  7.  Hypertension-above goal in clinic today, recommend that he restart checking home blood pressures about 3-4 times weekly, reassess next visit.  Reports adherence with lisinopril though may need to confirm dose.  Historically at or close to goal on current regimen.  8.  HM-still need to address shingles vaccine status.  Order for colonoscopy provided today  9.  History of hyponatremia and renal insufficiency-in range in 3/24, need to assess labs recently obtained in [place] though may consider routine CBC, CMP for this issue and DOAC monitoring.    F/u: 3 months A1c, consider additional labs.    Subject/Objective:     Chief Complaint   Patient presents with    Follow-up     Follow up diabetes     S: 62 y.o. year old male with PMHx significant for   Past Medical History:   Diagnosis Date    Asthma     Asymptomatic human immunodeficiency virus (HIV) infection status (CMS-HCC) 04/09/2013    Diagnosed in 2005 with a CD4 of 32 and on at least one prior regimen on a study, then switched to Truvada with ritonavir boosted atazanavir. Changed to Truvada with boosted darunavir secondary to reflux symptoms. Has remained undetectable for several years with CD4 count greater than 500.      CAD (coronary artery disease)     Depression     Diabetes mellitus (CMS-HCC)     DVT (deep venous thrombosis) (CMS-HCC)     Eczema     Enlargement of liver     GERD (gastroesophageal reflux disease)     HIV (human immunodeficiency virus infection) (CMS-HCC)     Hyperlipidemia     Hypertension     Myocardial infarction (CMS-HCC)     Obesity     Pulmonary embolism (CMS-HCC) 2012    Sleep apnea    .     Patient Active Problem List   Diagnosis    Pulmonary embolism 1/12, long-term anticoag per hematology    Allergy to latex    Chronic cardiopulmonary disease (CMS-HCC)    Chronic diastolic heart failure (CMS-HCC)    Cytomegaloviral disease (CMS-HCC)    Dermatitis    Dysplasia of anus  Enthesopathy of knee    Esophageal reflux    Gout    Horseshoe tear of retina    Human immunodeficiency virus (HIV) disease (CMS-HCC)    Essential hypertension    Molluscum contagiosum    Mononeuritis    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 infarction    Convulsions (CMS-HCC)    Hereditary and idiopathic peripheral neuropathy    Inguinal hernia    Early syphilis, secondary syphilis    Coronary atherosclerosis (RAF-HCC)    Acid reflux (RAF-HCC)    Chronic eczema    Depression    Allergic rhinitis    Uncontrolled type 2 diabetes mellitus    Diastasis recti    AIDS (CMS-HCC)    Obesity (BMI 30.0-34.9)    Disc disease, degenerative, lumbar or lumbosacral    'Light-for-dates' infant with signs of fetal malnutrition    Hyperlipidemia    Syncope    Dehydration    Diarrhea of presumed infectious origin    Acute renal failure (ARF) (CMS-HCC)       Presents for routine follow-up       Your Medication List            Accurate as of October 31, 2023 11:41 AM. If you have any questions, ask your nurse or doctor.                CONTINUE taking these medications      atorvastatin 40 MG tablet  Commonly known as: LIPITOR  Take 1 tablet (40 mg total) by mouth daily.     betamethasone dipropionate 0.05 % cream  Apply 1 Application topically two (2) times a day.     BIKTARVY 50-200-25 mg tablet  Generic drug: bictegrav-emtricit-tenofov ala  Take 1 tablet by mouth daily.     cetirizine 10 MG tablet  Commonly known as: ZYRTEC  Take 1 tablet (10 mg total) by mouth two (2) times a day.     colchicine 0.6 mg tablet  Commonly known as: COLCRYS  TAKE 1 TABLET BY MOUTH DAILY AS NEEDED FOR GOUT  Start taking on: December 14, 2023     DUPIXENT SYRINGE 300 mg/2 mL Syrg injection  Generic drug: dupilumab  Inject 2 mL (300 mg total) under the skin every fourteen (14) days. For maintenance dose     DUPIXENT SYRINGE 300 mg/2 mL Syrg injection  Generic drug: dupilumab  Inject the contents of 2 pens (600 mg) under the skin once for loading dose     ELIQUIS 5 mg Tab  Generic drug: apixaban  Take 1 tablet (5 mg total) by mouth two (2) times a day.     empagliflozin 25 mg tablet  Commonly known as: JARDIANCE  Take 1 tablet (25 mg total) by mouth daily.     furosemide 20 MG tablet  Commonly known as: LASIX  Take 1 tablet (20 mg total) by mouth daily.     hydrocortisone 2.5 % cream  Apply 1 application. topically two (2) times a day.     hydrocortisone 2.5 % ointment  Apply 1 Application topically two (2) times a day. To active areas of the face until smooth     hydrOXYzine 25 MG tablet  Commonly known as: ATARAX  Take 1 tablet (25 mg total) by mouth every six (6) hours as needed.     lisinopril 10 MG tablet  Commonly known as: PRINIVIL,ZESTRIL  Take 1 tablet (10 mg total) by mouth daily.     methocarbamol 750 MG tablet  Commonly known as:  ROBAXIN  750mg  4x daily prn muscle spasms     metoPROLOL succinate 25 MG 24 hr tablet  Commonly known as: Toprol-XL  Take 1 tablet (25 mg total) by mouth daily.     omeprazole 40 MG capsule  Commonly known as: PriLOSEC  TAKE 1 CAPSULE(40 MG) BY MOUTH DAILY     RYBELSUS 14 mg Tab  Generic drug: semaglutide  Take 14 mg by mouth daily.     sertraline 50 MG tablet  Commonly known as: ZOLOFT  Take 1 tablet (50 mg total) by mouth daily.     triamcinolone 0.1 % cream  Commonly known as: KENALOG  Apply topically two (2) times a day.     triamcinolone 0.1 % ointment  Commonly known as: KENALOG  Apply topically two (2) times a day. To active areas of trunk and extremities until smooth              Allergies   Allergen Reactions    Latex Rash    Shellfish Containing Products      Other reaction(s): SHORTNESS OF BREATH    Metformin Nausea Only     Both IR and XR formulations    Both IR and XR formulations   Both IR and XR formulations    Both IR and XR formulations      Both IR and XR formulations Both IR and XR formulations    Penicillins Rash        Medication adherence and barriers to the treatment plan have been addressed. Opportunities to optimize healthy behaviors have been discussed. Patient / caregiver voiced understanding.      ROS: negative for vision changes, CP, abdominal pain, bowel/bladder changes, bleeding, lower extremity edema    O:  Vital Signs:    Vitals:    10/31/23 1114   BP: 150/84   Pulse: 83   Temp: 36.7 ??C (98.1 ??F)   TempSrc: Temporal   SpO2: 96%   Weight: (!) 108 kg (238 lb)   Height: 182.9 cm (6')       Physical Exam  General Appearance:    Alert, cooperative, no distress, appears stated age   Head:    Normocephalic, without obvious abnormality, atraumatic   Eyes:     Ears:     Throat:   Lips, mucosa, and tongue normal   Neck:   Supple, symmetrical, trachea midline,      No JVD bilaterally.   Lungs:     Clear to auscultation bilaterally, respirations unlabored   Chest Wall:    No tenderness or deformity    Heart:    Regular rate and rhythm, S1 and S2 normal, no murmur, rub    or gallop   Abdomen:     Soft, non-tender       Extremities:   Extremities normal, atraumatic, no cyanosis or edema   Psych :  No agitation, anxiety, or depressed mood.      Note - This record has been created using [employer]. Chart creation errors have been sought, but may not always have been located. Such creation errors do not reflect on the standard of medical care.

## 2023-10-31 NOTE — Unmapped (Addendum)
Please contact GI at [phone] to schedule colonoscopy  Check home blood pressures about 3-4 x weekly, seated at least 5 minutes before checking  Stop Eliquis 2 days before colonoscopy, restart the next day  Bring lab results next visit

## 2023-11-07 ENCOUNTER — Ambulatory Visit: Admit: 2023-11-07 | Discharge: 2023-11-08 | Payer: MEDICARE

## 2023-11-07 MED ADMIN — triamcinolone acetonide (KENALOG-40) injection 40 mg: 40 mg | @ 14:00:00 | Stop: 2023-11-07

## 2023-11-07 NOTE — Unmapped (Signed)
Addended by: Earleen Reaper on: 11/07/2023 10:27 AM     Modules accepted: Orders

## 2023-11-07 NOTE — Unmapped (Addendum)
[facility]  [employer]. Ida Rogue, PA-C    ASSESSMENT:  Left long trigger finger  PLAN:  I discussed with the patient diagnosis and treatment options of left long trigger finger and we will proceed with cortisone injection which patient is in agreement with. Was advised to monitor blood sugars close for the next 3-5 days due to risk of hyperglycemia. Will follow up if symptoms persist or worsen.  I have also referred patient to dermatology due to color change about the left ring finger nail  We will proceed with the following treatment plan:  Medications: OTC Tylenol  DME/Cast: none  PT/OT: none  Injections: trigger finger injection  5.   Follow-up: No follow-ups on file.   6.   X-rays at next visit: none  Risks of procedure were discussed including but not limited to bleeding, damage to surrounding nerves and tendons, hypopigmentation, hyperglycemia.  The patient verbalized understanding and provided consent.  A timeout was performed prior to procedure identified the correct patient, location, medication.  0.5cc lidocaine and 1cc kenalog were injected into the left long finger at the level of the A1 pulley using aseptic technique.  The patient tolerated this well.  Following the procedure, the patient reported no complications.  Advised to avoid vigorous activities for the next few days and then the patient may proceed with activity as tolerated.     SUBJECTIVE:  Chief Complaint:  left long finger pain  History of Present Illness:   Ryan Davenport is a 62 y.o. right hand dominant male  who presents for evaluation of left long trigger finger that has been present approx 4 weeks. He reports painful catching and locking that worsenw ith activities and in the mornings. Thinks he may have provoked it when lifting a tray but is unsure. Denies any numbness/tingling in the hand. Denies taking any regular medications for symptoms. Works at [employer] History Past Medical History:   Diagnosis Date Asthma     Asymptomatic human immunodeficiency virus (HIV) infection status (CMS-HCC) 04/09/2013    Diagnosed in 2005 with a CD4 of 32 and on at least one prior regimen on a study, then switched to Truvada with ritonavir boosted atazanavir. Changed to Truvada with boosted darunavir secondary to reflux symptoms. Has remained undetectable for several years with CD4 count greater than 500.      CAD (coronary artery disease)     Depression     Diabetes mellitus (CMS-HCC)     DVT (deep venous thrombosis) (CMS-HCC)     Eczema     Enlargement of liver     GERD (gastroesophageal reflux disease)     HIV (human immunodeficiency virus infection) (CMS-HCC)     Hyperlipidemia     Hypertension     Myocardial infarction (CMS-HCC)     Obesity     Pulmonary embolism (CMS-HCC) 2012    Sleep apnea       Surgical History Past Surgical History:   Procedure Laterality Date    PR COLONOSCOPY FLX DX W/COLLJ SPEC WHEN PFRMD N/A 11/11/2013    Procedure: COLONOSCOPY, FLEXIBLE, PROXIMAL TO SPLENIC FLEXURE; DIAGNOSTIC, W/WO COLLECTION SPECIMEN BY BRUSH OR WASH;  Surgeon: Romero Belling, MD;  [facility];  Service: Gastroenterology    PR UPPER GI ENDOSCOPY,DIAGNOSIS N/A 04/01/2019    Procedure: UGI ENDO, INCLUDE ESOPHAGUS, STOMACH, & DUODENUM &/OR JEJUNUM; DX W/WO COLLECTION SPECIMN, BY BRUSH OR WASH;  Surgeon: Leland Her, MD;  Location: GI [facility];  Service: Gastroenterology    SKIN BIOPSY        Medications   Current Outpatient Medications:     atorvastatin (LIPITOR) 40 MG tablet, Take 1 tablet (40 mg total) by mouth daily., Disp: 90 tablet, Rfl: 3    betamethasone dipropionate 0.05 % cream, Apply 1 Application topically two (2) times a day., Disp: , Rfl:     BIKTARVY 50-200-25 mg tablet, Take 1 tablet by mouth daily., Disp: 90 tablet, Rfl: 3    cetirizine (ZYRTEC) 10 MG tablet, Take 1 tablet (10 mg total) by mouth two (2) times a day., Disp: 60 tablet, Rfl: 11    [START ON 12/14/2023] colchicine (COLCRYS) 0.6 mg tablet, TAKE 1 TABLET BY MOUTH DAILY AS NEEDED FOR GOUT, Disp: 90 tablet, Rfl: 3    dupilumab (DUPIXENT SYRINGE) 300 mg/2 mL Syrg injection, Inject 2 mL (300 mg total) under the skin every fourteen (14) days. For maintenance dose, Disp: 4 mL, Rfl: 11    dupilumab (DUPIXENT SYRINGE) 300 mg/2 mL Syrg injection, Inject the contents of 2 pens (600 mg) under the skin once for loading dose, Disp: 4 mL, Rfl: 0    ELIQUIS 5 mg Tab, Take 1 tablet (5 mg total) by mouth two (2) times a day., Disp: , Rfl:     empagliflozin (JARDIANCE) 25 mg tablet, Take 1 tablet (25 mg total) by mouth daily., Disp: 90 tablet, Rfl: 3    furosemide (LASIX) 20 MG tablet, Take 1 tablet (20 mg total) by mouth daily., Disp: , Rfl:     hydrocortisone 2.5 % cream, Apply 1 application. topically two (2) times a day., Disp: 453 g, Rfl: 3    hydrocortisone 2.5 % ointment, Apply 1 Application topically two (2) times a day. To active areas of the face until smooth, Disp: 454 g, Rfl: 5    hydrOXYzine (ATARAX) 25 MG tablet, Take 1 tablet (25 mg total) by mouth every six (6) hours as needed., Disp: , Rfl:     lisinopril (PRINIVIL,ZESTRIL) 10 MG tablet, Take 1 tablet (10 mg total) by mouth daily., Disp: 90 tablet, Rfl: 3    methocarbamoL (ROBAXIN) 750 MG tablet, 750mg  4x daily prn muscle spasms, Disp: 60 tablet, Rfl: 2    metoprolol succinate (TOPROL-XL) 25 MG 24 hr tablet, Take 1 tablet (25 mg total) by mouth daily., Disp: 90 tablet, Rfl: 3    omeprazole (PRILOSEC) 40 MG capsule, TAKE 1 CAPSULE(40 MG) BY MOUTH DAILY, Disp: 90 capsule, Rfl: 3    semaglutide (RYBELSUS) 14 mg Tab, Take 14 mg by mouth daily., Disp: 30 tablet, Rfl: 11    sertraline (ZOLOFT) 50 MG tablet, Take 1 tablet (50 mg total) by mouth daily., Disp: 90 tablet, Rfl: 3    triamcinolone (KENALOG) 0.1 % cream, Apply topically two (2) times a day., Disp: 453 g, Rfl: 3    triamcinolone (KENALOG) 0.1 % ointment, Apply topically two (2) times a day. To active areas of trunk and extremities until smooth, Disp: 454 g, Rfl: 5   Allergies Latex, Shellfish containing products, Metformin, and Penicillins     Social History Social History     Tobacco Use    Smoking status: Never     Passive exposure: Never    Smokeless tobacco: Never   Vaping Use    Vaping status: Never Used   Substance Use Topics    Alcohol use: No     Alcohol/week: 0.0 standard drinks of alcohol    Drug use: No  Family History family history includes Aneurysm in his father; Arthritis in his mother; Diabetes in his mother; Heart disease in his mother; Hypertension in his mother.     Review of Systems 12 point review of systems were obtained in clinic and all pertinent postives/negatives were documented in the HPI.       OBJECTIVE:  Physical Exam:  General Appearance well-nourished and no acute distress   Mood and Affect alert, cooperative, and pleasant   Gait and Station neutral standing alignment   Cardiovascular well-perfused distally and no swelling   Sensation Sensation intact to light touch distally  in the bilateral median, ulnar, and radial nerve distribution    MUSCULOSKELETAL    Right upper extremity Inspection: Skin clean, dry, and intact  Tenderness: Nontender about the wrist/hand diffusely  ROM: Able to make a full fist and bring fingers to full extension  Stability Exam: No evidence of instability of the wrist  Strength: 5/5 grip strength   Special Test:  none   Left upper extremity Inspection: Skin clean, dry, and intact evidence of darkening at the base of the left ring finger nail  Tenderness: tender at the left long a1 pulley  ROM: Able to make a full fist and bring fingers to full extension and with evidence of triggering of the left long finger  Stability Exam: No evidence of instability of the wrist  Strength: 4+/5 grip strength   Special Test: positive clench fist test    DME ORDER:  Dx:  ,

## 2023-11-07 NOTE — Unmapped (Signed)
You received a corticosteroid injection to reduce pain and inflammation.  Please note that it can take up to 2 weeks for this injection to fully work.  While many people will feel relief sooner, please be patient.    The injection contained a corticosteroid and a numbing agent.  The numbing agent can last for 24 hours.  You may have increased pain until the steroid has a chance to work, usually lasting no more than 1-2 days. You may take over the counter pain medication (Tylenol/ibuprofen/etc) if not otherwise contra-indicated. You may also ice the area.    What are some of the possible side effects of a steroid injection?    Common side effects:  temporarily elevated blood sugar (in diabetic patients) that can last a few days   flushing of the skin, especially the face  temporary rise in blood pressure  discoloration or atrophy of the skin at the injection site    Call your doctor at once if you have:  persistent worsening pain or swelling, fever;  blurred vision, tunnel vision, eye pain, or seeing halos around lights;  fast or slow heartbeats;  increased blood pressure that is associated with severe headache, blurred vision, pounding in your neck or ears, anxiety, nosebleed;  headaches, ringing in your ears, dizziness, nausea, vision problems, pain behind your eyes    This is not a complete list of side effects and others may occur. Call your doctor for medical advice about side effects.       You can resume your normal daily activities, but consider resting the injected area for the next few days.

## 2023-11-28 NOTE — Unmapped (Signed)
Returned call to patient.  Requested to have Rx for Dupixent sent to his local pharmacy [facility].  Informed that it is being sent to him by [facility].  Gave patient number to call them to set up delivery.    Patient appreciated the call.

## 2023-12-10 NOTE — Unmapped (Signed)
NEW PATIENT CALL    Duration of Intervention: 15 minutes    New diagnosis: no  Transfer of care: yes    Previous HIV treatment: yes         Previous provider: [facility]     How is your access to medications: Yes    Are you feeling sick or ill today? No  Do you have any fever, chills, or new rashes?  No  (have nurse call if yes)    Insurance: Medicare A B & D     Transportation to appt: Yes    Do you have stable housing? Yes    Do you have a stable income? No, Describe Disability.     If patient is not a new diagnosis and has stable income, transportation, housing, and access to insurance and medication without issues, schedule a short orientation (30 min)    PATIENT INFORMED OF THE FOLLOWING:  Date/time of appt, Provider, ID Clinic location, Allow time for parking, etc., Bring all meds in original containers if possible, [employer] card, ID, and income information, 24 hr notice for cancellation, Montez Hageman available for anyone accompanying. Answered patients??? questions.    Priscille Kluver MPH, Care Manager

## 2023-12-11 ENCOUNTER — Ambulatory Visit: Admit: 2023-12-11 | Discharge: 2023-12-11 | Payer: MEDICARE

## 2023-12-11 ENCOUNTER — Ambulatory Visit
Admit: 2023-12-11 | Discharge: 2023-12-11 | Payer: MEDICARE | Attending: Student in an Organized Health Care Education/Training Program | Primary: Student in an Organized Health Care Education/Training Program

## 2023-12-11 DIAGNOSIS — E785 Hyperlipidemia, unspecified: Principal | ICD-10-CM

## 2023-12-11 DIAGNOSIS — B20 Human immunodeficiency virus [HIV] disease: Principal | ICD-10-CM

## 2023-12-11 DIAGNOSIS — Z1151 Encounter for screening for human papillomavirus (HPV): Principal | ICD-10-CM

## 2023-12-11 DIAGNOSIS — Z9189 Other specified personal risk factors, not elsewhere classified: Principal | ICD-10-CM

## 2023-12-11 DIAGNOSIS — Z8619 Personal history of other infectious and parasitic diseases: Principal | ICD-10-CM

## 2023-12-11 DIAGNOSIS — Z13220 Encounter for screening for lipoid disorders: Principal | ICD-10-CM

## 2023-12-11 DIAGNOSIS — Z79899 Other long term (current) drug therapy: Principal | ICD-10-CM

## 2023-12-11 DIAGNOSIS — Z0184 Encounter for antibody response examination: Principal | ICD-10-CM

## 2023-12-11 DIAGNOSIS — Z113 Encounter for screening for infections with a predominantly sexual mode of transmission: Principal | ICD-10-CM

## 2023-12-11 DIAGNOSIS — Z1389 Encounter for screening for other disorder: Principal | ICD-10-CM

## 2023-12-11 DIAGNOSIS — E119 Type 2 diabetes mellitus without complications: Principal | ICD-10-CM

## 2023-12-11 DIAGNOSIS — Z5181 Encounter for therapeutic drug level monitoring: Principal | ICD-10-CM

## 2023-12-11 DIAGNOSIS — Z1159 Encounter for screening for other viral diseases: Principal | ICD-10-CM

## 2023-12-11 DIAGNOSIS — Z1212 Encounter for screening for malignant neoplasm of rectum: Principal | ICD-10-CM

## 2023-12-11 DIAGNOSIS — Z111 Encounter for screening for respiratory tuberculosis: Principal | ICD-10-CM

## 2023-12-11 DIAGNOSIS — K118 Other diseases of salivary glands: Principal | ICD-10-CM

## 2023-12-11 DIAGNOSIS — Z23 Encounter for immunization: Principal | ICD-10-CM

## 2023-12-11 DIAGNOSIS — Z125 Encounter for screening for malignant neoplasm of prostate: Principal | ICD-10-CM

## 2023-12-11 LAB — HEMOGLOBIN A1C
ESTIMATED AVERAGE GLUCOSE: 137 mg/dL
HEMOGLOBIN A1C: 6.4 % — ABNORMAL HIGH (ref 4.8–5.6)

## 2023-12-11 LAB — LIPID PANEL
CHOLESTEROL/HDL RATIO SCREEN: 3.6 (ref 1.0–4.5)
CHOLESTEROL: 156 mg/dL (ref <=200)
HDL CHOLESTEROL: 43 mg/dL (ref 40–60)
LDL CHOLESTEROL CALCULATED: 91 mg/dL (ref 40–99)
NON-HDL CHOLESTEROL: 113 mg/dL (ref 70–130)
TRIGLYCERIDES: 109 mg/dL (ref 0–150)
VLDL CHOLESTEROL CAL: 21.8 mg/dL (ref 12–42)

## 2023-12-11 LAB — CBC W/ AUTO DIFF
BASOPHILS ABSOLUTE COUNT: 0 10*9/L (ref 0.0–0.1)
BASOPHILS RELATIVE PERCENT: 0.6 %
EOSINOPHILS ABSOLUTE COUNT: 0.2 10*9/L (ref 0.0–0.5)
EOSINOPHILS RELATIVE PERCENT: 3.7 %
HEMATOCRIT: 37.3 % — ABNORMAL LOW (ref 39.0–48.0)
HEMOGLOBIN: 12.2 g/dL — ABNORMAL LOW (ref 12.9–16.5)
LYMPHOCYTES ABSOLUTE COUNT: 1.6 10*9/L (ref 1.1–3.6)
LYMPHOCYTES RELATIVE PERCENT: 26.5 %
MEAN CORPUSCULAR HEMOGLOBIN CONC: 32.7 g/dL (ref 32.0–36.0)
MEAN CORPUSCULAR HEMOGLOBIN: 26.2 pg (ref 25.9–32.4)
MEAN CORPUSCULAR VOLUME: 80 fL (ref 77.6–95.7)
MEAN PLATELET VOLUME: 7.4 fL (ref 6.8–10.7)
MONOCYTES ABSOLUTE COUNT: 0.5 10*9/L (ref 0.3–0.8)
MONOCYTES RELATIVE PERCENT: 8.4 %
NEUTROPHILS ABSOLUTE COUNT: 3.7 10*9/L (ref 1.8–7.8)
NEUTROPHILS RELATIVE PERCENT: 60.8 %
PLATELET COUNT: 327 10*9/L (ref 150–450)
RED BLOOD CELL COUNT: 4.66 10*12/L (ref 4.26–5.60)
RED CELL DISTRIBUTION WIDTH: 17.3 % — ABNORMAL HIGH (ref 12.2–15.2)
WBC ADJUSTED: 6.1 10*9/L (ref 3.6–11.2)

## 2023-12-11 LAB — BASIC METABOLIC PANEL
ANION GAP: 7 mmol/L (ref 5–14)
BLOOD UREA NITROGEN: 8 mg/dL — ABNORMAL LOW (ref 9–23)
BUN / CREAT RATIO: 7
CALCIUM: 9.7 mg/dL (ref 8.7–10.4)
CHLORIDE: 104 mmol/L (ref 98–107)
CO2: 25.6 mmol/L (ref 20.0–31.0)
CREATININE: 1.08 mg/dL (ref 0.73–1.18)
EGFR CKD-EPI (2021) MALE: 78 mL/min/{1.73_m2} (ref >=60)
GLUCOSE RANDOM: 83 mg/dL (ref 70–179)
POTASSIUM: 3.9 mmol/L (ref 3.4–4.8)
SODIUM: 137 mmol/L (ref 135–145)

## 2023-12-11 LAB — ALT: ALT (SGPT): 15 U/L (ref 10–49)

## 2023-12-11 LAB — AST: AST (SGOT): 27 U/L (ref <=34)

## 2023-12-11 LAB — PSA: PROSTATE SPECIFIC ANTIGEN: 0.42 ng/mL (ref 0.00–4.00)

## 2023-12-11 LAB — BILIRUBIN, TOTAL: BILIRUBIN TOTAL: 0.3 mg/dL (ref 0.3–1.2)

## 2023-12-11 MED ORDER — BIKTARVY 50 MG-200 MG-25 MG TABLET
ORAL_TABLET | Freq: Every day | ORAL | 3 refills | 90.00 days | Status: CP
Start: 2023-12-11 — End: ?

## 2023-12-11 NOTE — Unmapped (Signed)
NEW PATIENT SPECIALTY ASSESSMENT      Date of Assessment: 12/11/2023  SW completing the assessment:  Tanisha Lutes  Duration of Service: 60 minutes     History of HIV  New Diagnosis/Never in HIV care: No    Previously in HIV Care:  Yes      Diagnosis Date: 2005 (An estimate is fine.)      Diagnosis Location [place], State): [place], [place]  Previous ID Provider: [facility]  Location [place], State):  [employer], [place]    Approximate Last Appointment with Previous ID Provider: 05/2023    Currently prescribed HIV medication?  [x]  Yes  []  No      If yes, please answer the following questions.   What is your current HIV medication(s)?  Biktarvy  *We will get the full medication list later. *    Do you know if your last viral load viral load was detectable or undetectable? Undetectable      Have you had any of the following concerns with taking your ID medication every day in the past year?     (Check all that apply.)  []  Transportation  []  Insurance   []  Stigma (I felt shame about taking them)       []  Mental Health  []  Substance Use  []  Forgetfulness   []  Side Effects  []  I get tired of having to take medication every day (Medication Fatigue)  []  Affordability []  Competing Priorities      []  Other:     Why are you choosing to transfer to [facility]? (Check all that apply.)  [x]  Just moved to the area []  My insurance changed        []  I had problems with my past provider  []  I trust [facility]   []  A friend/family recommended  []  Provider referral   []  Newly diagnosed  []  Other:    Demographics  Ethnicity:  []  Hispanic  [x]  Non-Hispanic  []  Unknown    Race:  []  American Indian/[place] Native []  Asian  [x]  Black or African American   []  Native Hawaiian/Pacific Islander []  White  []  More than one race    []  Unknown/Unreported  []  Other:     What was your sex assigned at birth?  []  Male [x]  Male []  Transgender (MtF, FtM, or unknown) []  Unknown/Unreported   []  Prefer not to say  []  Other:     What is your current gender identity?  []  Male [x]  Male []  Transgender (MtF, FtM, or unknown) []  Unknown/Unreported   []  Prefer not to say  []  Other: __________________________      How would you like your pronouns to be listed in EPIC? (Check all that apply.)  [x]  He/him/his  []  She/her/hers []  They/Them/Theirs  []  Other: ___________________     What is your sexual orientation?   []  Heterosexual []  Homosexual []  Bisexual/Pansexual  []  Asexual   []  Other:   [x]  Prefer not to say    What are/were your risk factors for acquiring HIV? (Check all that apply.)  []  Injection Drug Use (IDU)   []  Needle stick  []  Male to Male Sexual Contact (MSM)   []  Heterosexual Contact (M-F; F-M) []  Perinatal Transmission []  Hemophilia/Coagulation Disorder   []  Receipt of Transfusion of blood, blood components, or tissue [x]  I don't know/Unsure    []  Other (Please describe):    How do you prefer to be contacted? (Check all that apply.)  [x]  Phone Call  [x]  Texting  []   Email  []  MyChart    []  Mail   []  Other:     Language preference: English    What is your highest level of education?  []  Grade school  []  Some High School  []  High School Diploma or GED  [x]  Some College Credit/No degree obtained  []  Associate degree    []  Bachelor's degree  []  Master's degree  []  Doctorate degree    []  Trade/Technical/Vocational Training   []  Other:     Health Insurance Status  What is your current health insurance? (Check all that apply & add insurance company name.)  [x]  Medicare A & B []  Medicare Part D  []  Medicare Advantage Plan:   []  Medicaid - full []  Medicaid Family Planning only    []  Tricare, VA, or other military health []  Private-Employer/Family:   []  Private-Marketplace:   []  Uninsured  []  Unknown     Do you have a Primary Care Provider (PCP)?  [x]  Yes  []  No      Name of PCP? Salli Quarry, [place]  PCP Location [place], State): [place], [place]  []  Check here if you need a referral to a PCP.    Do you have any current specialty provider(s)?  []  Yes  [x]  No      Name of specialty provider(s)? N/A  []  Check here if you need a referral to a specialty provider.    Have you had any hospitalizations, surgeries, or medical concerns in the past year? []  Yes [x]  No      If yes, please describe:     Do you have any family planning goals? []  Yes [x]  No      If yes, please describe:     Do you have a preferred pharmacy?  [x]  Yes  []  No      Name of preferred [facility]? [facility]  []  Check here if you need a referral to a preferred pharmacy.    Do you have dental insurance?  [x]  Yes  []  No      Do you have dental needs and/or pain? No    Do you have vision insurance?  [x]  Yes  []  No      Do you have vision needs?  No    Housing  What is your housing status?   [x]  Stable/Permanent       []  Temporary     []  Unstable       []  Unknown/Unreported      Current Housing Situation:   [x]  Live alone       []  Family/Friends     []  Shelter      []  Hotel       []  Experiencing homelessness      Do you need any help with completing daily physical tasks at home? []  Yes  [x]  No      If yes, do you have help already? []  Yes  []  No  If yes, please list who provides you support at home. (Check all that apply.)   []  Family member   []  Friend  []  Home Aide    []  Neighbor     []  Other (Please describe):     Social History  Do you have any current legal concerns or needs? []  Yes  [x]  No      If yes, please describe:     Do you think you have a good support system? [x]  Yes []  No        Please describe  your support system: Patient endorses that God and his mother is his primary source of support.     Have you disclosed your status to a family member, friend, partner or any other support system?   [x]  Yes  []  No      If yes, please select all that apply:   [x]  Family member   [x]  Friend  [x]  Partner      []  Other (Please describe):  Coworkers    Are you currently in a relationship? []  Yes [x]  No      If yes, please describe:     Are you sexually activity? []  Yes [x]  No        If you are sexually active, when was your last sexual contact?  []  Within this week   []  Month  []  Three months      [x]  Six months   []  Year   []  More than a year     Are you familiar with PrEP? [x]  Yes []  No      If yes, please describe: Patient endorses that PrEP is a preventative measure for individuals who are HIV negative.     Have you used tobacco products in the past 6 months?    []  Yes      [x]  No      If you are currently using tobacco, are you interested in quitting or reducing?  []  Yes      []  No        Transportation  Do you have stable access to transportation? []  Yes  []  No      If no, please describe:     How do you plan to get to and from your clinic appointments? (Check all that apply.)  [x]  Own Vehicle  []  Bike    []  Public Transit/Bus       []  Walk   []  Medicaid Transportation   []  Family member or Friend   []  Taxi or Ride Share ([place] or Investment banker, corporate)    []  Other (Please describe):              Finances  Do you have stable income?  [x]  Yes       []  No        What is your income source? (Check all that apply.)  *We use this information to qualify you for programs.*  []  Salary/Wages    []  Self-Employment    []  Unemployment Benefits   [x]  Social Security (Retirement/Disability/Survivors)  []  Other (please describe):     Are you experiencing any financial concerns or stress? []  Yes      [x]  No      If yes, please describe:     Food Access   Please read each statement and check off whether the statement was often true, sometimes true, or never true for your household in the last month    We worried whether our food would run out before we got money to buy more  []  Often True   []  Sometimes True  [x]  Never True    The food that we bought just didn't last, and we didn't have money to get more   []  Often True   []  Sometimes True  [x]  Never True    Mental Health and Substance Use History (See also other screening forms PHQ-9, GAD-7, and SAMISS.)    PHQ-9 Score: 7    GAD-7 Score: 4      What are your interests  and/or hobbies? Patient enjoys cooking. What do you do to cope when you're stressed? Patient bakes cakes when he's feeling stressed.     Do you feel safe in your relationship or home? Yes      Religion/Spirituality (Sometimes our beliefs shape the way we prefer to receive medical care.)   Do you identify with any religion or spirituality? []  Yes [x]  No      If yes, please describe:       Would you like referrals to community resources?  []  Yes  [x]  No      If yes, describe what resources you need: ______________________________________________________        Substance Abuse and Mental Illness Symptoms Screener (SAMISS)    Substance Use:     Do you currently drink alcohol?    [] Yes      [x] No         If yes: When was the last time you drank?  []  within the last 2 weeks     []  2-4 weeks     [] 1-3 months    []  3-6 months       []  6-12 months  If no: Did you ever drink? [] Yes [] No     In the last year, how often did you have a drink containing alcohol?     [x]   Never (0)      []   Monthly or less (1)      []   2-4 times/mo (2)      []   2-3 times/wk (3)     []   4 or more times/wk (4)    In the last year, how many drinks did you have on a typical day when you were drinking?     []   None 0      [x]   1 or 2 (1)       []   3 or 4 (2)       []   5 or 6 (3)      []   7-9 (4)      []   10 or more (5)    In the last year, how often did you have four or more drinks on one occasion?     [x]  Never (0)      []  Less than monthly (1)      []  Monthly (2)      []  Weekly (3)      []  Daily or almost daily (4)    In the last year, how often did you use non-prescription drugs to get high or to change the way you feel?     [x]  Never (0)      []  Less than monthly (1)      []  Monthly (2)      []  Weekly (3)      []  Daily or almost daily (4)    If yes: Which non-prescription drugs did you use?     []  Marijuana  []  Crack/Cocaine  []  Heroin  []  Meth  []  Other:      Do you take prescription pain medication?     [] Yes     [x] No    If yes: Do you ever take more than the doctor prescribes? [] Yes [] No     In the last year, how often did you use drugs prescribed to you or to someone else to get high or change the way you feel?     [x]  Never (0)      []  Less  than monthly (1)      []  Monthly (2)      []  Weekly (3)      []  Daily or almost daily (4)    In the last year, how often did you drink or use drugs more than you meant to?    [x]  Never (0)      []  Less than monthly (1)      []  Monthly (2)      []  Weekly (3)      []  Daily or almost daily (4)    How often did you feel you wanted or needed to cut down on your drinking or drug use in the last year, and not been able to?     [x]  Never (0)      []  Less than monthly (1)      []  Monthly (2)      []  Weekly (3)      []  Daily or almost daily (4)    Have you ever had a problem fulfilling major role obligations because of your drug or alcohol use (I.e. Job/school, relationships)?     [] Yes  [x] No    Mental Health:     To your knowledge, have you ever been diagnosed with a mental health disorder?    [] Yes       [x] No    If yes, specify diagnosis:    In the last year, did you ever take medication/antidepressants for depression or nerve problems?    [] Yes  [x] No    In the last year, was there ever a time when you felt sad, down, or depressed for 2 weeks or more in a row?    [] Yes  [x] No    In the last year, did you ever have a period lasting one month or longer when most of the time you felt worried and anxious?    [] Yes  [x] No    In the last year, did you have a spell or an attack when all of a sudden you felt frightened, anxious, or very uneasy when most people would not be afraid or anxious?     [] Yes  [x] No    At any time in your life, when you were not using drugs or alcohol, did you ever see or  hear things that other people did not?     [] Yes  [x] No    At any time in your life, have you experienced or witnessed a traumatic event(s) such as physical or sexual abuse, domestic violence, street violence, or accident?     [] Yes   [x]  No    If yes: In the last year, have you been troubled by flashbacks, nightmares, or thoughts of the trauma? [] Yes []  No    In the last three months, have you experienced any life event(s) or received information that was so upsetting it affected how you cope with everyday life?     [] Yes  [x] No      Mental Health History    Have you ever seen a mental health or substance use professional for problems with:  -emotions, nerves, or mental health?   [] Yes  [x] No  If yes: In the last year? [] Yes    [] No    -drugs or alcohol?   [] Yes      [x] No     If yes: In the last year? [] Yes    [] No    Have you ever stayed overnight in a hospital or treatment center because of your  problems with:   -emotions, nerves, or mental health?   [] Yes  [x] No  If yes: In the last year? [] Yes    [] No    -drugs or alcohol?   [] Yes      [x] No     If yes: In the last year? [] Yes    [] No    Has anyone ever expressed concern about your:  -emotions, nerves, or mental health?   [] Yes  [x] No  If yes: In the last year? [] Yes    [] No    -drugs or alcohol?   [] Yes      [x] No     If yes: In the last year? [] Yes    [] No     Enrollment in [facility], MH, or SUD Services? Yes    Summary of Intervention: CM provided the patient with the following information:    Address: [address], [place], [place] [address]  State Courier #: [id number]  Phone: [phone]  Fax Number: [phone]  If you would prefer to apply online here is the website: [email or web address]       Medical Case Management Care Plan  Resource Navigation     NEED: Patient has identified potential/current social barriers to treatment adherence and will need assistance with navigating social/health care systems       GOAL: To decrease/eliminate social barriers for HIV treatment and overall healthcare     PERSON: Nurse, Market researcher, Medical Provider, Social Worker, Garment/textile technologist and governmental agencies     INTERVENTIONS:    1. Social Worker will assist patient with navigating benefits applications.    2. Social Worker will locate resources that can help with obtaining fns benefits.    3. Social Worker will provide patient with these community resources   4. Patient will be responsible for contacting agencies to request assistance   5. Patient will contact social worker if further coordination is needed and SW will assist   6. Social Worker will reassess patient's need for continued coordination services within 6 months     OUTCOME:    1. Staff documentation in EPIC of barriers, progression, and efforts    2. Reassess when necessary or at the minimum every six months for continued need    This patient is being provided a service through [employer] of resort. A benefits investigation was done by social work staff and the patient was found to have no insurance or that their health insurance does not cover the services necessary for patient success. In some cases, if health insurance does cover the service, social work staff serve as interim providers until a long term service provider can be found in cases where current service providers are not accessible due to affordability, availability, or specialist knowledge.        Plan: CM will follow-up with patient as needed with additional information/resources.       Priscille Kluver MPH, Care Manager

## 2023-12-11 NOTE — Unmapped (Unsigned)
[facility]    *** PLEASE REFRESH NOTE BEFORE CLOSING        HIV (dx'd 2005, nadir CD4 30 / ***% in 2005)  Overall ***. Current regimen: {ID Clinic ARV Selection ZOXW:96045}  Misses doses of ARVs {ID - Frequency:68239}    Med access {ID Clinic Rx Access:60826}  CD4 count {ID Clinic - CD4 Count & Prophylaxis:77705}  Discussed {ID Clinic Discussion Topics:60825}    Lab Results   Component Value Date    ACD4 440 (L) 09/15/2016    CD4 40 09/15/2016    HIVRS Not Detected 08/16/2014       CD4, HIV RNA, and safety labs (full return panel)  Continue current therapy  {ID Clinic - Adherence:61194} ARV adherence     Cabenuva Discussion  During the visit today, I discussed with the patient the risks and benefits of Cabenuva treatment and the schedule of injections. {He/She/They:22704} have verbalized understanding of this discussion and would like to pursue treatment every {Blank single:51009} months.  Current ART regimen: ***  I confirm that the patient fulfills the eligibility requirements below:  HIV positive: {YES / NO:22418}  Suppressed viral load for at least 3 months: {YES/NO:29867}  Last viral load on ***  Prior tolerance to INSTI and NNRTI: {YES/NO:29867}  No prior virologic failures (no prior INSTI or NNRTI resistance):  {Yes/No:22953}  Do not have active hepatitis B virus infection, unless also receiving an oral HBV active regimen:  {Yes/No:22953}  Are not pregnant or planning to become pregnant:  No  Agrees to be seen for monthly or bimonthly injections:  Yes  Patient education includes:  Importance of consistently attending injection appointments and follow up appointments  Having reliable transportation for appointments   Having reliable way to communicate with clinic and for clinic staff to be able to contact patient if need be  Willingness to receive one moderate volume injection in each gluteal each treatment visit  Discussed any upcoming travel or consistent travel (as for work, etc.)  Discussed any anticipated changes to insurance/coverage plan.  Agrees to sign texting agreement and enroll in MyChart in order to facilitate communication and appointment reminders.  Opting for oral lead-in?  {Yes/No:22953}  Has been on LAI treatment in the past?  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 Has not yet been seen by ENT    Panic Attacks?    Atopic Dermatitis  On Dupixent, triamcinolone cream, hydrocortisone cream, and cetirizine    Left 3rd finger trigger finger  S/p steroid injection 11/07/23     12/11/2023: Summary of diagnoses and orders for today's visit:  {No diagnosis found. (Refresh or delete this SmartLink)}    Disposition  No follow-ups on file.    To do @ next RTC  ***    I personally spent *** minutes face-to-face and non-face-to-face in the care of this patient, which includes all pre, intra, and post visit time on the date of service.  All documented time was specific to the E/M visit and does not include any procedures that may have been performed.      Venita Sheffield, MD  Chief Complaint   Establish care for HIV    HPI  In addition to details in A&P above  Ryan Davenport is a 62 y.o. 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 Moved back to [facility] in August. Able to a  Knots coming up in neck-feels like itching and burning on neck. Feels like there is tightness. More pain-feels like it may be eczema. Has been going on for 4-5 months. Bothers every day.     No fever/chills/n/v                   There were no vitals taken for this visit.   Wt Readings from Last 3 Encounters:   10/31/23 (!) 108 kg (238 lb)   09/12/23 (!) 108 kg (238 lb)   07/31/23 (!) 108.2 kg (238 lb 9.6 oz)       Physical Exam:    Constitutional: No acute distress  Eyes: Lids and lashes normal.  Anicteric sclera.  ENT: Oropharynx without any erythema or exudate.  Moist mucous membranes  Neck: Supple without any enlargement  Lymph nodes: No adenopathy in cervical or supraclavicular nodes  Cardiovascular: Regular, rate and rhythm without murmurs rubs or gallops.  Palpable distal pulses.  No edema  Pulmonary: Clear to auscultation bilaterally without wheezes/crackles/rhonchi.  Normal work of breathing.  Symmetric chest wall movement.  Abdomen/GI: Soft, nontender, nondistended  Skin/Integument: Warm and dry without rash  Musculoskeletal: No joint tenderness.  Full range of motion.  Neurologic: Grossly nonfocal.  Alert and oriented to person, place and time.  Cranial nerves grossly intact.  Moves all extremities.  Psychiatry: Appropriate           Health maintenance      Oral health   The patient does or does not have a dentist. Last dental exam unsure-needs to establish care.    Eye health  The patient does or does not use corrective lenses. Last eye exam about 1 month ago.    Metabolic conditions  Wt Readings from Last 5 Encounters:   10/31/23 (!) 108 kg (238 lb)   09/12/23 (!) 108 kg (238 lb)   07/31/23 (!) 108.2 kg (238 lb 9.6 oz)   04/30/23 (!) 110.4 kg (243 lb 6.4 oz)   01/30/23 (!) 105.9 kg (233 lb 6.4 oz)     Lab Results   Component Value Date    CREATININE 1.10 09/12/2023    PROTEINUA 100 mg/dL (A) 16/09/9603    PROTEINUR 88 12/13/2013    GLUCOSEU 100 mg/dL (A) 54/08/8118    ALBCRERAT 527.1 (H) 01/30/2023    PCRATIOUR 0.457 12/13/2013    GLU 117 (H) 09/12/2023    A1C 6.2 07/31/2023    ALT 19 09/12/2023    ALT 18 10/10/2022    ALT 21 05/11/2022     # Kidney health - SCr and eGFR  UA  # Bone health - assessment(s) needed but deferred to future visit; DEXA scan.   # Diabetes assessment - check A1c  # NAFLD assessment - obtaining labs to calculate non-invasive scores    Communicable diseases  Lab Results   Component Value Date    HEPAIGG Reactive (A) 08/16/2014    HEPBSAB Reactive (A) 07/08/2019    HEPCAB Negative 08/16/2014     # TB screening - check IGRA  # Hepatitis screening - meets criteria for annual HCV (re)screening per DHHS & HIVMA  # MMR screening - measles (rubeola) IgG  mumps IgG  rubella IgG  VZV IgG    Lab Results   Component Value Date    RPR NON-REACTIVE 04/16/2014    RPR NON-REACTIVE 12/14/2013     GC/CT NAATs - obtained today from all exposed anatomical site(s)  RPR - for screening obtained today     Cancer screening  Lab Results   Component Value Date    FINALDX  10/16/2021     Abdomen, punch  - Extensive dermal neutrophil infiltrate, see comment       # Cervical - no indication for screening  # Breast - no indication for screening  # Anorectal - assessment needed but deferred to future visit  # Colorectal -  referred for colonoscopy by PCP  # Liver - no screening indicated  # Lung - screening not indicated  # Prostate - PSA ordered    Cardiovascular disease  Lab Results   Component Value Date    CHOL 89 (L) 06/21/2016    HDL 36 (L) 06/21/2016    LDL 36 (L) 06/21/2016    LDL <30.0 (L) 06/21/2016    NONHDL 53 06/21/2016    TRIG 86 06/21/2016     # The ASCVD Risk score (Arnett DK, et al., 2019) failed to calculate.  - is not taking aspirin (on Eliquis)  - is taking statin  - BP control fair  - never smoker  # AAA screening - no indication for screening    Immunization History   Administered Date(s) Administered    COVID-19 VAC,BIVALENT(12YR UP),PFIZER 09/18/2021    COVID-19 VACC,MRNA,(PFIZER)(PF) 10/28/2020, 12/28/2020    Hepatitis A (Adult) 04/21/2012, 08/21/2012    INFLUENZA TIV (TRI) PF (IM)(HISTORICAL) 11/13/2006, 10/13/2007, 09/12/2009, 11/15/2010, 01/24/2012, 10/29/2012    Influenza Vaccine Quad (IIV4 PF) 6-17mo 09/28/2013    Influenza Vaccine Quad (IIV4 W/PRESERV) 53MO+ 09/18/2021    Influenza Vaccine Quad(IM)6 MO-Adult(PF) 09/28/2013, 09/06/2014, 09/15/2016, 09/30/2017, 12/30/2017, 09/11/2018, 01/22/2019, 09/07/2019, 09/15/2020    Influenza Virus Vaccine, unspecified formulation 10/29/2012, 09/29/2015    Meningococcal ACWY, Unspecified Formulation 01/01/2017    Meningococcal Conjugate MCV4P 01/01/2017    PNEUMOCOCCAL POLYSACCHARIDE 23-VALENT 09/12/2009, 04/17/2014, 01/25/2016    PPD Test 10/13/2007, 11/02/2008, 04/19/2010, 06/06/2011, 07/06/2011    Pneumococcal Conjugate 13-Valent 09/12/2009    TdaP 03/08/2014       Immunizations today - COVID seasonal (1 dose 19-64 - 2 doses 65+)  zoster recombinant (2 doses - 0 and 2-77m)    Past Medical History:   Diagnosis Date    Asthma     Asymptomatic human immunodeficiency virus (HIV) infection status (CMS-HCC) 04/09/2013    Diagnosed in 2005 with a CD4 of 32 and on at least one prior regimen on a study, then switched to Truvada with ritonavir boosted atazanavir. Changed to Truvada with boosted darunavir secondary to reflux symptoms. Has remained undetectable for several years with CD4 count greater than 500.      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 Last sexually active about 6 months ago.  Parts of body used during sex include: anus/rectum and penis. Versatile for anal sex. Does not have oral sex. Does not have vaginal sex.   In past 6 months has had insertive anal and receptive anal sex and has not had add'l STI screening.  He always uses condoms for anal sex  He does routinely discuss HIV status with partner(s).  Have not discussed interest in having children.    Medications and Allergies  He has a current medication list which includes the following prescription(s): atorvastatin, betamethasone dipropionate, biktarvy, cetirizine, [START ON 12/14/2023] colchicine, dupixent syringe, dupixent syringe, eliquis, empagliflozin, furosemide, hydrocortisone, hydroxyzine, lisinopril, methocarbamol, metoprolol succinate, omeprazole, rybelsus, sertraline, triamcinolone, and [DISCONTINUED] citalopram.    Allergies: Latex, Shellfish containing products, Metformin, and Penicillins    Family History  His family history includes Aneurysm in his father; Arthritis in his mother; Diabetes in his mother; Heart disease in his mother; Hypertension in his mother.  Father: brain aneurysm-father  Mother: diabetes, HTN, heart disease  1 brother and 2 sisters

## 2023-12-12 LAB — LYMPH MARKER LIMITED,FLOW
ABSOLUTE CD3 CNT: 1312 {cells}/uL (ref 915–3400)
ABSOLUTE CD4 CNT: 640 {cells}/uL (ref 510–2320)
ABSOLUTE CD8 CNT: 608 {cells}/uL (ref 180–1520)
CD3% (T CELLS): 82 % (ref 61–86)
CD4% (T HELPER): 40 % (ref 34–58)
CD4:CD8 RATIO: 1.1 (ref 0.9–4.8)
CD8% T SUPPRESR: 38 % (ref 12–38)

## 2023-12-12 LAB — RPR, THERAPY MONITORING: SYPHILIS RPR SCREEN: NONREACTIVE

## 2023-12-12 LAB — HEPATITIS C ANTIBODY: HEPATITIS C ANTIBODY: NONREACTIVE

## 2023-12-12 LAB — HIV RNA, QUANTITATIVE, PCR: HIV RNA QNT RSLT: NOT DETECTED

## 2023-12-12 NOTE — Unmapped (Signed)
Patient completed [employer] application. Eligible for RW B&C grant services and Caps on Charges. IPL= 106%, FPL= 106%. Expires 12/23/2024    RW Eligibility Form informing patient about RW services and Caps on charges was sent to patient via Mail        Mickle Asper,  Benefits & Eligibility Coordinator  Time of Intervention: 2 minutes

## 2023-12-12 NOTE — Unmapped (Signed)
Name: Ryan Davenport  Date: 12/12/2023  Address: [address]  [place] [address]   [place] of Residence:  [place]  Phone: [phone]     Started assessment with patient options: in clinic    Is this the same address for mailing? Yes  If no, Mailing Address is:     What is your preferred method of contact? Text (Requires Texting agreement)    Is there anyone that you would want to add as your personal contact? Yes; if yes, please use SmartPhrase RWPersonalContactInfo to gather their contacts information.    Full Name: [name]  DOB: N/A  Relationship to you: Mother  Phone number: [phone]  Are they your emergency contact? Yes  Is their address the same as you?  No, if no, what is their address? N/A  Is this person aware of your HIV status? Yes      Housing Status  Stable/Permanent; if so, what is their housing type: Renting and living - room, house, or apartment    Insurance  Medicaid and Medicare Part C    Marital Status  Single    Tax Press photographer Status  I did not file taxes     Employment Status  Unemployed    Income  Social Security (Retirement/Survivor's/Disability)    If no or low income, how are you meeting your basic needs?  Other, specify: UHC provides money for bills/food    List Tax Household Members including relationship to you:   N/A    Someone in my household receives: Not Applicable (for household members)  Specify who: N/A    Do you or anyone in your home have income adjustments? No    If yes, which adjustments do you have? N/A      Medication Access/Barriers: none at this time    Do you have a current diagnosis for Hepatitis C?  Lab Results   Component Value Date    HEPCAB Negative 08/16/2014       Federal Marketplace Eligibility Assessment  Patient has affordable insurance through [employer], [place], and or Employment and is not eligible.    Patient given ACA education if they qualified based on answers to questions above.     MyChart  Do you have an active MyChart account? No     If MyChart is not set up, informed patient on how to set up MyChart Yes    Patient was informed of the following programs;   N/A    The following applications/handouts were given to patient:   N/A    The following forms were also started with the patient:   N/A    Medicaid:  N/A      Ryan White/HMAP application status: Complete    Patient is applying for [employer] on Charges Only     Additional Comments: Provided a [employer].                  Mickle Asper,  Benefits & Eligibility Coordinator  Time of Intervention: 10 minutes

## 2023-12-13 LAB — MUMPS ANTIBODY, IGG: MUMPS IGG ANTIBODY: NEGATIVE — AB

## 2023-12-13 LAB — RUBELLA ANTIBODY, IGG: RUBELLA IGG SCREEN: NEGATIVE

## 2023-12-13 LAB — RUBEOLA ANTIBODY IGG: RUBEOLA IGG ANTIBODY: NEGATIVE — AB

## 2023-12-14 MED ORDER — COLCHICINE 0.6 MG TABLET
ORAL_TABLET | 3 refills | 0 days | Status: CP
Start: 2023-12-14 — End: ?

## 2023-12-16 LAB — QUANTIFERON TB GOLD PLUS
QUANTIFERON ANTIGEN 1 MINUS NIL: 0 [IU]/mL
QUANTIFERON ANTIGEN 2 MINUS NIL: 0 [IU]/mL
QUANTIFERON MITOGEN: 9.99 [IU]/mL
QUANTIFERON TB GOLD PLUS: NEGATIVE
QUANTIFERON TB NIL VALUE: 0.01 [IU]/mL

## 2023-12-16 LAB — TB AG1: TB AG1 VALUE: 0.01

## 2023-12-16 LAB — TB NIL: TB NIL VALUE: 0.01

## 2023-12-16 LAB — TB AG2: TB AG2 VALUE: 0.01

## 2023-12-16 LAB — TB MITOGEN: TB MITOGEN VALUE: 10

## 2023-12-24 NOTE — Unmapped (Signed)
Medical Case Management Social Work Note     Duration of Intervention: 15 minutes    TYPE OF CONTACT: Phone    ASSESSMENT: CM inquired with the patient about the resources provided during new patient assessment.     ADHERENCE: Reports good adherence with no barriers.    INTERVENTION:  Patient endorses that he has not had the opportunity to completed food and nutrition application but will complete it soon. CM educated patient that if he needs any additional assistance he could contact his local dss at [phone].     PLAN:  CM will follow-up as needed with additional information/resources.      Priscille Kluver MPH, Care Manager

## 2023-12-31 ENCOUNTER — Emergency Department
Admit: 2023-12-31 | Discharge: 2024-01-01 | Disposition: A | Discharge status: Home / Self Care | Payer: MEDICARE | Attending: Family Medicine

## 2023-12-31 DIAGNOSIS — M109 Gout, unspecified: Principal | ICD-10-CM

## 2023-12-31 LAB — BASIC METABOLIC PANEL
ANION GAP: 2 mmol/L — ABNORMAL LOW (ref 5–14)
BLOOD UREA NITROGEN: 11 mg/dL (ref 7–21)
BUN / CREAT RATIO: 10
CALCIUM: 9 mg/dL (ref 8.5–10.2)
CHLORIDE: 110 mmol/L — ABNORMAL HIGH (ref 98–107)
CO2: 28 mmol/L (ref 22.0–32.0)
CREATININE: 1.1 mg/dL (ref 0.70–1.30)
EGFR CKD-EPI (2021) MALE: 76 mL/min/{1.73_m2} (ref >=60)
GLUCOSE RANDOM: 96 mg/dL (ref 74–106)
POTASSIUM: 4.2 mmol/L (ref 3.5–5.0)
SODIUM: 140 mmol/L (ref 135–145)

## 2023-12-31 LAB — CBC W/ AUTO DIFF
BASOPHILS ABSOLUTE COUNT: 0 10*9/L (ref 0.0–0.1)
BASOPHILS RELATIVE PERCENT: 0.7 %
EOSINOPHILS ABSOLUTE COUNT: 0.3 10*9/L (ref 0.0–0.5)
EOSINOPHILS RELATIVE PERCENT: 4.4 %
HEMATOCRIT: 38 % — ABNORMAL LOW (ref 39.0–48.0)
HEMOGLOBIN: 11.9 g/dL — ABNORMAL LOW (ref 12.9–16.5)
LYMPHOCYTES ABSOLUTE COUNT: 1.5 10*9/L (ref 1.1–3.6)
LYMPHOCYTES RELATIVE PERCENT: 23.9 %
MEAN CORPUSCULAR HEMOGLOBIN CONC: 31.3 g/dL — ABNORMAL LOW (ref 32.0–36.0)
MEAN CORPUSCULAR HEMOGLOBIN: 26 pg (ref 25.9–32.4)
MEAN CORPUSCULAR VOLUME: 83 fL (ref 77.6–95.7)
MEAN PLATELET VOLUME: 7.1 fL (ref 6.8–10.7)
MONOCYTES ABSOLUTE COUNT: 0.5 10*9/L (ref 0.3–0.8)
MONOCYTES RELATIVE PERCENT: 8.1 %
NEUTROPHILS ABSOLUTE COUNT: 3.9 10*9/L (ref 1.8–7.8)
NEUTROPHILS RELATIVE PERCENT: 62.9 %
NUCLEATED RED BLOOD CELLS: 0 /100{WBCs} (ref <=4)
PLATELET COUNT: 339 10*9/L (ref 150–450)
RED BLOOD CELL COUNT: 4.58 10*12/L (ref 4.26–5.60)
RED CELL DISTRIBUTION WIDTH: 17.4 % — ABNORMAL HIGH (ref 12.2–15.2)
WBC ADJUSTED: 6.2 10*9/L (ref 3.6–11.2)

## 2023-12-31 LAB — URIC ACID: URIC ACID: 9.3 mg/dL — ABNORMAL HIGH (ref 4.0–9.0)

## 2023-12-31 MED ORDER — PREDNISONE 20 MG TABLET
ORAL_TABLET | 0 refills | 0.00 days | Status: CP
Start: 2023-12-31 — End: ?

## 2023-12-31 MED ORDER — OXYCODONE 5 MG TABLET
ORAL_TABLET | Freq: Four times a day (QID) | ORAL | 0 refills | 1.00 days | Status: CP | PRN
Start: 2023-12-31 — End: 2024-01-05

## 2024-01-01 MED ADMIN — predniSONE (DELTASONE) tablet 40 mg: 40 mg | ORAL | @ 04:00:00 | Stop: 2023-12-31

## 2024-01-01 MED ADMIN — (TAKE HOME) oxyCODONE (ROXICODONE) 5 mg immediate release tablet ADS Med: @ 05:00:00 | Stop: 2023-12-31

## 2024-01-01 NOTE — Unmapped (Signed)
[facility] Emergency Department Provider Note    ED Clinical Impression     Final diagnoses:   Podagra (Primary)       ED Assessment/Plan & Course   63 y.o. male with a past medical history of T2DM, PE, DVT, CAD, MI, HIV, and HTN, presenting with left foot pain and swelling over the past 2 weeks.  On exam patient is well-appearing in no acute distress he does have swelling to his left foot and ankle.  Tenderness over the first MTP.    Plain film of the foot and ankle with soft tissue swelling but otherwise without acute abnormality.    CBC and BMP overall unremarkable.  Uric acid elevated at 9.3.    Given swelling and tenderness over the first MTP with elevated uric acid and history of gout suspect this is a gout flare.  We will treat with prednisone taper and recommendations for Tylenol.  Patient is given short course of oxycodone for severe pain    Portions of this record have been created using Scientist, clinical (histocompatibility and immunogenetics). Dictation errors have been sought, but may not have been identified and corrected.               ____________________________________________      History     Chief Complaint    Foot Swelling      HPI   Ryan Davenport is a 64 y.o. male with a past medical history of T2DM, VTE on Eliquis, CAD, MI, HIV, and HTN, who presents to the ED for evaluation of left foot pain and swelling, which began 2 weeks ago. He states he first noticed pain to his left foot when walking at work. The swelling to his left foot and lower leg is increased with walking. He denies any inciting injury or trauma. He does state last week he tripped and fell onto the floor, but denies any other injury besides left foot pain. He denies chest pain or SOB. Patient takes colchicine for gout daily, last dose was yesterday. Takes Eliquis daily.    Past Medical History:   Diagnosis Date    Asthma     Asymptomatic human immunodeficiency virus (HIV) infection status (CMS-HCC) 04/09/2013    Diagnosed in 2005 with a CD4 of 32 and on at least one prior regimen on a study, then switched to Truvada with ritonavir boosted atazanavir. Changed to Truvada with boosted darunavir secondary to reflux symptoms. Has remained undetectable for several years with CD4 count greater than 500.      CAD (coronary artery disease)     Depression     Diabetes mellitus (CMS-HCC)     DVT (deep venous thrombosis) (CMS-HCC)     Eczema     Enlargement of liver     GERD (gastroesophageal reflux disease)     HIV (human immunodeficiency virus infection) (CMS-HCC)     Hyperlipidemia     Hypertension     Myocardial infarction (CMS-HCC)     Obesity     Pulmonary embolism (CMS-HCC) 2012    Sleep apnea        Past Surgical History:   Procedure Laterality Date    PR COLONOSCOPY FLX DX W/COLLJ SPEC WHEN PFRMD N/A 11/11/2013    Procedure: COLONOSCOPY, FLEXIBLE, PROXIMAL TO SPLENIC FLEXURE; DIAGNOSTIC, W/WO COLLECTION SPECIMEN BY BRUSH OR WASH;  Surgeon: Romero Belling, MD;  [facility]: [facility];  Service: Gastroenterology    PR UPPER GI ENDOSCOPY,DIAGNOSIS N/A 04/01/2019    Procedure: UGI ENDO, INCLUDE  ESOPHAGUS, STOMACH, & DUODENUM &/OR JEJUNUM; DX W/WO COLLECTION SPECIMN, BY BRUSH OR WASH;  Surgeon: Leland Her, MD;  [facility]: [facility];  Service: Gastroenterology    SKIN BIOPSY         No current facility-administered medications for this encounter.    Current Outpatient Medications:     atorvastatin (LIPITOR) 40 MG tablet, Take 1 tablet (40 mg total) by mouth daily., Disp: 90 tablet, Rfl: 3    betamethasone dipropionate 0.05 % cream, Apply 1 Application topically two (2) times a day., Disp: , Rfl:     BIKTARVY 50-200-25 mg tablet, Take 1 tablet by mouth daily., Disp: 90 tablet, Rfl: 3    cetirizine (ZYRTEC) 10 MG tablet, Take 1 tablet (10 mg total) by mouth two (2) times a day., Disp: 60 tablet, Rfl: 11    colchicine (COLCRYS) 0.6 mg tablet, TAKE 1 TABLET BY MOUTH DAILY AS NEEDED FOR GOUT, Disp: 90 tablet, Rfl: 3    doxycycline (MONODOX) 100 MG capsule, TAKE 1 CAPSULE BY MOUTH IN THE MORNING AND IN THE EVENING, Disp: , Rfl:     dupilumab (DUPIXENT SYRINGE) 300 mg/2 mL Syrg injection, Inject 2 mL (300 mg total) under the skin every fourteen (14) days. For maintenance dose, Disp: 4 mL, Rfl: 11    dupilumab (DUPIXENT SYRINGE) 300 mg/2 mL Syrg injection, Inject the contents of 2 pens (600 mg) under the skin once for loading dose, Disp: 4 mL, Rfl: 0    ELIQUIS 5 mg Tab, Take 1 tablet (5 mg total) by mouth two (2) times a day., Disp: , Rfl:     empagliflozin (JARDIANCE) 25 mg tablet, Take 1 tablet (25 mg total) by mouth daily., Disp: 90 tablet, Rfl: 3    furosemide (LASIX) 20 MG tablet, Take 1 tablet (20 mg total) by mouth daily., Disp: , Rfl:     hydrocortisone 2.5 % ointment, Apply 1 Application topically two (2) times a day. To active areas of the face until smooth, Disp: 454 g, Rfl: 5    hydrOXYzine (ATARAX) 25 MG tablet, Take 1 tablet (25 mg total) by mouth every six (6) hours as needed., Disp: , Rfl:     lisinopril (PRINIVIL,ZESTRIL) 10 MG tablet, Take 1 tablet (10 mg total) by mouth daily., Disp: 90 tablet, Rfl: 3    methocarbamoL (ROBAXIN) 750 MG tablet, 750mg  4x daily prn muscle spasms, Disp: 60 tablet, Rfl: 2    metoprolol succinate (TOPROL-XL) 25 MG 24 hr tablet, Take 1 tablet (25 mg total) by mouth daily., Disp: 90 tablet, Rfl: 3    omeprazole (PRILOSEC) 40 MG capsule, TAKE 1 CAPSULE(40 MG) BY MOUTH DAILY, Disp: 90 capsule, Rfl: 3    oxyCODONE (ROXICODONE) 5 MG immediate release tablet, Take 1 tablet (5 mg total) by mouth every six (6) hours as needed for pain for up to 5 days., Disp: 4 tablet, Rfl: 0    predniSONE (DELTASONE) 20 MG tablet, 2 daily x 3 days, then 1 daily x 3 days then 1/2 daily x 4 days, Disp: 20 tablet, Rfl: 0    semaglutide (RYBELSUS) 14 mg Tab, Take 14 mg by mouth daily., Disp: 30 tablet, Rfl: 11    sertraline (ZOLOFT) 50 MG tablet, Take 1 tablet (50 mg total) by mouth daily., Disp: 90 tablet, Rfl: 3 triamcinolone (KENALOG) 0.1 % ointment, Apply topically two (2) times a day. To active areas of trunk and extremities until smooth, Disp: 454 g, Rfl: 5    Allergies  Latex,  Shellfish containing products, Metformin, and Penicillins    Family History   Problem Relation Age of Onset    Diabetes Mother     Heart disease Mother     Hypertension Mother     Arthritis Mother     Aneurysm Father     Skin cancer Neg Hx     Melanoma Neg Hx     Basal cell carcinoma Neg Hx     Squamous cell carcinoma Neg Hx        Social History  Social History     Tobacco Use    Smoking status: Never     Passive exposure: Never    Smokeless tobacco: Never   Vaping Use    Vaping status: Never Used   Substance Use Topics    Alcohol use: No     Alcohol/week: 0.0 standard drinks of alcohol    Drug use: No       Review of Systems  Review of Systems   Respiratory:  Negative for shortness of breath.    Cardiovascular:  Positive for leg swelling. Negative for chest pain.   Musculoskeletal:  Positive for arthralgias, joint swelling and myalgias.   All other systems reviewed and are negative.      Physical Exam     ED Triage Vitals [12/31/23 1618]   Enc Vitals Group      BP 162/87      Heart Rate 109      SpO2 Pulse       Resp 18      Temp 36.7 ??C (98.1 ??F)      Temp Source Temporal      SpO2 99 %      Weight (!) 111.1 kg (245 lb)      Height        Physical Exam  Vitals reviewed.   Constitutional:       Appearance: He is not toxic-appearing or diaphoretic.   HENT:      Head: Normocephalic and atraumatic.   Cardiovascular:      Rate and Rhythm: Tachycardia present.   Pulmonary:      Effort: Pulmonary effort is normal.   Musculoskeletal:      Comments: Left lower extremity with pedal edema and tenderness over the first MTP.  Patient has swelling of the left ankle as well.  Pedal pulses intact and 2+.   Skin:     General: Skin is warm and dry.      Capillary Refill: Capillary refill takes less than 2 seconds.   Neurological:      General: No focal deficit present.      Mental Status: He is alert and oriented to person, place, and time.   Psychiatric:         Mood and Affect: Mood normal.         Behavior: Behavior normal.           EKG       Labs     Results for orders placed or performed during the hospital encounter of 12/31/23   Basic Metabolic Panel   Result Value Ref Range    Sodium 140 135 - 145 mmol/L    Potassium 4.2 3.5 - 5.0 mmol/L    Chloride 110 (H) 98 - 107 mmol/L    CO2 28.0 22.0 - 32.0 mmol/L    Anion Gap 2 (L) 5 - 14 mmol/L    BUN 11 7 - 21 mg/dL    Creatinine 0.86 5.78 -  1.30 mg/dL    BUN/Creatinine Ratio 10     eGFR CKD-EPI (2021) Male 76 >=60 mL/min/1.16m2    Glucose 96 74 - 106 mg/dL    Calcium 9.0 8.5 - 40.1 mg/dL   Uric acid   Result Value Ref Range    Uric Acid 9.3 (H) 4.0 - 9.0 mg/dL   CBC w/ Differential   Result Value Ref Range    WBC 6.2 3.6 - 11.2 10*9/L    RBC 4.58 4.26 - 5.60 10*12/L    HGB 11.9 (L) 12.9 - 16.5 g/dL    HCT 02.7 (L) 25.3 - 48.0 %    MCV 83.0 77.6 - 95.7 fL    MCH 26.0 25.9 - 32.4 pg    MCHC 31.3 (L) 32.0 - 36.0 g/dL    RDW 66.4 (H) 40.3 - 15.2 %    MPV 7.1 6.8 - 10.7 fL    Platelet 339 150 - 450 10*9/L    nRBC 0 <=4 /100 WBCs    Neutrophils % 62.9 %    Lymphocytes % 23.9 %    Monocytes % 8.1 %    Eosinophils % 4.4 %    Basophils % 0.7 %    Absolute Neutrophils 3.9 1.8 - 7.8 10*9/L    Absolute Lymphocytes 1.5 1.1 - 3.6 10*9/L    Absolute Monocytes 0.5 0.3 - 0.8 10*9/L    Absolute Eosinophils 0.3 0.0 - 0.5 10*9/L    Absolute Basophils 0.0 0.0 - 0.1 10*9/L       Radiology     XR Foot 3 Or More Views Left   Final Result   Diffuse soft tissue swelling without underlying osseous abnormality.            XR Ankle 3 Or More Views Left   Final Result   Soft tissue swelling without underlying osseous abnormality.                Procedures       Portions of this record have been created using Scientist, clinical (histocompatibility and immunogenetics). Dictation errors have been sought, but may not have been identified and corrected.    The chart was modified by Boneta Lucks using scribe services on December 31, 2023 9:10 PM, on behalf of Valaria Good, [place].    Documentation assistance was provided by the scribe in my presence. The documentation recorded by the scribe has been reviewed by me, amended with [employer], and accurately reflects the services I personally performed.             Valaria Good [place], [place]  12/31/23 2337

## 2024-01-01 NOTE — Unmapped (Signed)
Patient complains of left foot and leg swelling. Patient states it started when walking at work 2 weeks ago. Left foot and leg is swollen on assessment.

## 2024-01-07 NOTE — Unmapped (Signed)
[facility] Clinical Assessment & Refill Coordination Note    Ryan Davenport, DOB: [birth year]  Phone: [phone] (home)     All above HIPAA information was verified with patient.     Was a Nurse, learning disability used for this call? No    Specialty Medication(s):   Inflammatory Disorders: Dupixent     Current Outpatient Medications   Medication Sig Dispense Refill    atorvastatin (LIPITOR) 40 MG tablet Take 1 tablet (40 mg total) by mouth daily. 90 tablet 3    betamethasone dipropionate 0.05 % cream Apply 1 Application topically two (2) times a day.      BIKTARVY 50-200-25 mg tablet Take 1 tablet by mouth daily. 90 tablet 3    cetirizine (ZYRTEC) 10 MG tablet Take 1 tablet (10 mg total) by mouth two (2) times a day. 60 tablet 11    colchicine (COLCRYS) 0.6 mg tablet TAKE 1 TABLET BY MOUTH DAILY AS NEEDED FOR GOUT 90 tablet 3    doxycycline (MONODOX) 100 MG capsule TAKE 1 CAPSULE BY MOUTH IN THE MORNING AND IN THE EVENING      dupilumab (DUPIXENT SYRINGE) 300 mg/2 mL Syrg injection Inject 2 mL (300 mg total) under the skin every fourteen (14) days. For maintenance dose 4 mL 11    dupilumab (DUPIXENT SYRINGE) 300 mg/2 mL Syrg injection Inject the contents of 2 pens (600 mg) under the skin once for loading dose 4 mL 0    ELIQUIS 5 mg Tab Take 1 tablet (5 mg total) by mouth two (2) times a day.      empagliflozin (JARDIANCE) 25 mg tablet Take 1 tablet (25 mg total) by mouth daily. 90 tablet 3    furosemide (LASIX) 20 MG tablet Take 1 tablet (20 mg total) by mouth daily.      hydrocortisone 2.5 % ointment Apply 1 Application topically two (2) times a day. To active areas of the face until smooth 454 g 5    hydrOXYzine (ATARAX) 25 MG tablet Take 1 tablet (25 mg total) by mouth every six (6) hours as needed.      lisinopril (PRINIVIL,ZESTRIL) 10 MG tablet Take 1 tablet (10 mg total) by mouth daily. 90 tablet 3    methocarbamoL (ROBAXIN) 750 MG tablet 750mg  4x daily prn muscle spasms 60 tablet 2 metoprolol succinate (TOPROL-XL) 25 MG 24 hr tablet Take 1 tablet (25 mg total) by mouth daily. 90 tablet 3    omeprazole (PRILOSEC) 40 MG capsule TAKE 1 CAPSULE(40 MG) BY MOUTH DAILY 90 capsule 3    predniSONE (DELTASONE) 20 MG tablet 2 daily x 3 days, then 1 daily x 3 days then 1/2 daily x 4 days 20 tablet 0    semaglutide (RYBELSUS) 14 mg Tab Take 14 mg by mouth daily. 30 tablet 11    sertraline (ZOLOFT) 50 MG tablet Take 1 tablet (50 mg total) by mouth daily. 90 tablet 3    triamcinolone (KENALOG) 0.1 % ointment Apply topically two (2) times a day. To active areas of trunk and extremities until smooth 454 g 5     No current facility-administered medications for this visit.        Changes to medications: [name] reports no changes at this time.    Allergies   Allergen Reactions    Latex Rash    Shellfish Containing Products      Other reaction(s): SHORTNESS OF BREATH    Metformin Nausea Only     Both IR  and XR formulations    Both IR and XR formulations   Both IR and XR formulations    Both IR and XR formulations      Both IR and XR formulations Both IR and XR formulations    Penicillins Rash       Changes to allergies: No    SPECIALTY MEDICATION ADHERENCE     Dupixent 300  mg/55ml : 0 doses of medicine on hand       Medication Adherence    Patient reported X missed doses in the last month: 2  Specialty Medication: Dupixent  Patient is on additional specialty medications: No  Informant: patient          Specialty medication(s) dose(s) confirmed: Regimen is correct and unchanged.     Are there any concerns with adherence? No    Adherence counseling provided? Not needed    CLINICAL MANAGEMENT AND INTERVENTION      Clinical Benefit Assessment:    Do you feel the medicine is effective or helping your condition? No    Clinical Benefit counseling provided? consulted provider regarding clinical benefit concerns    Adverse Effects Assessment:    Are you experiencing any side effects? No    Are you experiencing difficulty administering your medicine? No    Quality of Life Assessment:    Quality of Life    Rheumatology  Oncology  Dermatology  Cystic Fibrosis          How many days over the past month did your atopic dermatitis  keep you from your normal activities? For example, brushing your teeth or getting up in the morning. 0    Have you discussed this with your provider? Not needed    Acute Infection Status:    Acute infections noted within Epic:  No active infections  Patient reported infection: None    Therapy Appropriateness:    Is therapy appropriate based on current medication list, adverse reactions, adherence, clinical benefit and progress toward achieving therapeutic goals? Yes, therapy is appropriate and should be continued     DISEASE/MEDICATION-SPECIFIC INFORMATION      For patients on injectable medications: Patient currently has 0 doses left.  Next injection is scheduled for ASAP.    Chronic Inflammatory Diseases: Have you experienced any flares in the last month? No  Has this been reported to your provider? Not applicable    PATIENT SPECIFIC NEEDS     Does the patient have any physical, cognitive, or cultural barriers? No    Is the patient high risk? No    Did the patient require a clinical intervention? No    Does the patient require physician intervention or other additional services (i.e., nutrition, smoking cessation, social work)? No    SOCIAL DETERMINANTS OF HEALTH     At the [facility], we have learned that life circumstances - like trouble affording food, housing, utilities, or transportation can affect the health of many of our patients.   That is why we wanted to ask: are you currently experiencing any life circumstances that are negatively impacting your health and/or quality of life? Patient declined to answer    Social Drivers of Health     Food Insecurity: No Food Insecurity (07/31/2023)    Hunger Vital Sign     Worried About Running Out of Food in the Last Year: Never true     Ran Out of Food in the Last Year: Never true   Internet Connectivity: Not on file  Housing/Utilities: Low Risk  (07/31/2023)    Housing/Utilities     Within the past 12 months, have you ever stayed: outside, in a car, in a tent, in an overnight shelter, or temporarily in someone else's home (i.e. couch-surfing)?: No     Are you worried about losing your housing?: No     Within the past 12 months, have you been unable to get utilities (heat, electricity) when it was really needed?: No   Tobacco Use: Low Risk  (10/31/2023)    Patient History     Smoking Tobacco Use: Never     Smokeless Tobacco Use: Never     Passive Exposure: Never   Transportation Needs: Unknown (07/31/2023)    PRAPARE - Therapist, art (Medical): Not on file     Lack of Transportation (Non-Medical): No   Alcohol Use: Not At Risk (07/31/2023)    Alcohol Use     How often do you have a drink containing alcohol?: Never     How many drinks containing alcohol do you have on a typical day when you are drinking?: 1 - 2     How often do you have 5 or more drinks on one occasion?: Never   Interpersonal Safety: Not At Risk (04/16/2022)    Interpersonal Safety     Unsafe Where You Currently Live: No     Physically Hurt by Anyone: No     Abused by Anyone: No   Physical Activity: Not on file   Intimate Partner Violence: Unknown (11/21/2022)    Received from [facility]    HITS     Physically Hurt: Not on file     Insult or Talk Down To: Not on file     Threaten Physical Harm: Not on file     Scream or Curse: Not on file   Stress: Not on file   Substance Use: Low Risk  (07/31/2023)    Substance Use     In the past year, how often have you used prescription drugs for non-medical reasons?: Never     In the past year, how often have you used illegal drugs?: Never     In the past year, have you used any substance for non-medical reasons?: No   Social Connections: Unknown (11/21/2022)    Received from [facility]    Social Network Social Network: Not on file   Financial Resource Strain: Low Risk  (07/31/2023)    Overall Financial Resource Strain (CARDIA)     Difficulty of Paying Living Expenses: Not hard at all   Depression: Not at risk (02/01/2023)    PHQ-2     PHQ-2 Score: 2   Health Literacy: Not on file       Would you be willing to receive help with any of the needs that you have identified today? Not applicable       SHIPPING     Specialty Medication(s) to be Shipped:   Inflammatory Disorders: Dupixent    Other medication(s) to be shipped: No additional medications requested for fill at this time     Changes to insurance: No    Delivery Scheduled: Yes, Expected medication delivery date: 01/09/24.     Medication will be delivered via Same Day Courier to the confirmed prescription address in [facility].    The patient will receive a drug information handout for each medication shipped and additional FDA Medication Guides as required.  Verified that patient  has previously received a Conservation officer, historic buildings and a Surveyor, mining.    The patient or caregiver noted above participated in the development of this care plan and knows that they can request review of or adjustments to the care plan at any time.      All of the patient's questions and concerns have been addressed.    Arnold Long, PharmD   [facility] Specialty Pharmacist

## 2024-01-09 MED FILL — DUPIXENT 300 MG/2 ML SUBCUTANEOUS SYRINGE: SUBCUTANEOUS | 28 days supply | Qty: 4 | Fill #1

## 2024-01-10 ENCOUNTER — Ambulatory Visit: Admit: 2024-01-10 | Discharge: 2024-01-11 | Payer: MEDICARE

## 2024-01-10 DIAGNOSIS — L608 Other nail disorders: Principal | ICD-10-CM

## 2024-01-10 DIAGNOSIS — L209 Atopic dermatitis, unspecified: Principal | ICD-10-CM

## 2024-01-10 MED ORDER — CICLOPIROX 8 % TOPICAL SOLUTION
Freq: Every evening | TOPICAL | 6 refills | 0.00 days | Status: CP
Start: 2024-01-10 — End: 2024-04-09

## 2024-01-10 NOTE — Unmapped (Signed)
Dermatology Note     Assessment and Plan:      Yellow-green nail discoloration, favor mold vs. Other infection   - Discussed diagnosis, etiology, and treatment options  - Start ciclopirox (PENLAC) 8 % solution; Apply topically nightly. Apply over nail and surrounding skin. Apply daily over previous coat. After seven (7) days, may remove with alcohol and continue cycle.  - Discussed that this may take months to resolve     Atopic dermatitis: chronic, severe after being off Dupixent, improving   - Patient flaring in setting of stopping medications. Patient has been following with Dr. Dub Mikes for the past 15 years and exam is consistent with atopic dermatitis. Review of chart shows that patient had previously been on methotrexate as well as dupixent, and had significant improvement while on dupixent.   - Continue with dupixent 300mg  every 14 days.    - Continue triamcinolone 0.1% ointment daily for full body wet wraps. This will be applied to the neck, front and back of the trunk, arms and legs.   - Continue hydrocortisone 2.5% ointment to be applied to the face, intertriginous areas (axilla, groin, intergluteal cleft, etc.)   - Continue cetirizine 10mg  BID  - Prior L leg duplex was negative for DVT     The patient was advised to call for an appointment should any new, changing, or symptomatic lesions develop.     RTC: Return in about 5 months (around 06/09/2024) for Eczema. or sooner as needed   _________________________________________________________________      Chief Complaint     Chief Complaint   Patient presents with    Nail Problem     Pt has discoloration on his left fourth fingernail and his fifth right fingernail for years that he thinks that is getting bigger.       HPI     Ryan Davenport is a 63 y.o. male who presents as a returning patient (last seen 09/25/2023) to Dermatology for  evaluation of discoloration of his nails.     Patient reports he noticed discoloration of two of his nails about a year ago that has not changed much since then. It does not bother him but when he went to get a procedure done on his hand last month the doctor recommended he get the nails evaluated.     He denies painting his nails, using artificial nails, or trauma to the nail. He does work in [employer] but does not spend too much time with his hands in water.     The patient denies any other new or changing lesions or areas of concern.     Pertinent Past Medical History     Past Medical History, Family History, Social History, Medication List, Allergies, and Problem List were reviewed in the rooming section of Epic.     ROS: Other than symptoms mentioned in the HPI, no fevers, chills, or other skin complaints    Physical Examination     GENERAL: Well-appearing male in no acute distress, resting comfortably.  NEURO: Alert and oriented, answers questions appropriately  SKIN: Examination of the bilateral upper extremities, hands, and nails was performed  - diffuse severe lichenification of the upper extremities, neck and face   - light brown/yellow/green discoloration of the R 5th finger and L 4th finger       All areas not commented on are within normal limits or unremarkable      (Approved Template 09/05/2020)

## 2024-01-20 ENCOUNTER — Encounter: Payer: Self-pay | Admitting: Podiatry

## 2024-01-20 ENCOUNTER — Ambulatory Visit (INDEPENDENT_AMBULATORY_CARE_PROVIDER_SITE_OTHER): Payer: 59 | Admitting: Podiatry

## 2024-01-20 DIAGNOSIS — M79675 Pain in left toe(s): Secondary | ICD-10-CM | POA: Diagnosis not present

## 2024-01-20 DIAGNOSIS — B351 Tinea unguium: Secondary | ICD-10-CM

## 2024-01-20 DIAGNOSIS — M79674 Pain in right toe(s): Secondary | ICD-10-CM | POA: Diagnosis not present

## 2024-01-20 NOTE — Progress Notes (Signed)
  Subjective:  Patient ID: Craig [name], male    DOB: [birth year],  MRN: [id number]  Nails thickened elongated painful   63 y.o. male presents with the above complaint. History confirmed with patient.     Nails are thickened and elongated and bothering him.  Had a flare of gout 2 weeks ago still notes some swelling present on the left.  Callus is no longer bothersome  Objective:  Physical Exam: warm, good capillary refill, no trophic changes or ulcerative lesions, normal DP and PT pulses, normal sensory exam.  Nails are thickened elongated yellow and discolored with subungual debris, increased welling on left side but no pain to palpation or range of motion of the ankle or subtalar or big toe   Assessment:   1. Pain due to onychomycosis of toenails of both feet       Plan:  Patient was evaluated and treated and all questions answered.       Discussed the etiology and treatment options for the condition in detail with the patient. Recommended debridement of the nails today. Sharp and mechanical debridement performed of all painful and mycotic nails today. Nails debrided in length and thickness using a nail nipper to level of comfort. Discussed treatment options including appropriate shoe gear. Follow up as needed for painful nails.    Return in about 3 months (around 04/19/2024) for at risk diabetic foot care.

## 2024-01-22 NOTE — Unmapped (Signed)
Medical Case Management Discharge Note    Duration of Intervention: 15 minutes    Talked with Patient? YES    TYPE OF CONTACT: Phone    REASON FOR DISCHARGE:  Completed Care Plan Goals    EFFECTIVE DATE:  01/22/24    OUTSIDE REFERRALS MADE: No      Bevelyn Arriola MPH, Care Manager

## 2024-01-30 ENCOUNTER — Ambulatory Visit: Admit: 2024-01-30 | Payer: MEDICARE

## 2024-02-18 NOTE — Unmapped (Signed)
 The [facility] has made a second and final attempt to reach this patient to refill the following medication:Dupixent.      We have been unable to leave messages on the following phone numbers: [phone] and have sent a text message to the following phone numbers: [phone] .    Dates contacted: 02/04/2024  02/18/2024  Last scheduled delivery: 01/09/2024    The patient may be at risk of non-compliance with this medication. The patient should call the [facility] at [phone]  Option 4, then Option 2: Dermatology, Gastroenterology, Rheumatology to refill medication.    Orlean Patten

## 2024-03-09 ENCOUNTER — Ambulatory Visit: Admit: 2024-03-09 | Discharge: 2024-03-10 | Payer: MEDICARE

## 2024-03-09 DIAGNOSIS — Z1211 Encounter for screening for malignant neoplasm of colon: Principal | ICD-10-CM

## 2024-03-09 DIAGNOSIS — B20 Human immunodeficiency virus [HIV] disease: Principal | ICD-10-CM

## 2024-03-09 DIAGNOSIS — M65332 Trigger finger, left middle finger: Principal | ICD-10-CM

## 2024-03-09 DIAGNOSIS — I10 Essential (primary) hypertension: Principal | ICD-10-CM

## 2024-03-09 DIAGNOSIS — E119 Type 2 diabetes mellitus without complications: Principal | ICD-10-CM

## 2024-03-09 DIAGNOSIS — I2699 Other pulmonary embolism without acute cor pulmonale: Principal | ICD-10-CM

## 2024-03-09 MED ORDER — OMEPRAZOLE 40 MG CAPSULE,DELAYED RELEASE
ORAL_CAPSULE | 3 refills | 0.00 days | Status: CP
Start: 2024-03-09 — End: ?

## 2024-03-09 NOTE — Unmapped (Addendum)
 Please contact GI at [phone] to schedule colonoscopy, stop Eliquis 2 days prior to procedure, restart day after procedure  Get shingles shot at your pharmacy  Increase omeprazole to 40mg  twice daily for stomach issues

## 2024-03-09 NOTE — Unmapped (Signed)
 Jillene Bucks, DMSc, PA-C    Assessment/Plan:     Lab Results   Component Value Date    A1C 6.4 (H) 12/11/2023    A1C 6.2 07/31/2023    A1C 6.7 09/26/2021       1.  DM2-A1c 8.2 today, reports dietary indiscretion.  Continue Jardiance 25 mg daily, Rybelsus 14 mg daily.  Work on carb reduction, reassess A1c 3 months.  2.  History of PE-continues to tolerate Eliquis, recent monitoring labs in December 2024 in January 2025 appropriate.  3.  HIV-followed by [facility] ID, appreciate assistance.  4.  Left third finger trigger finger-doing well status post injection by orthopedics.  5.  Atopic dermatitis-reports he needs to follow-up with pharmacy regarding filling his Dupixent.  6.  Hypertension-above goal in clinic today, not checking at home as he needs a new machine and plans to get a new digital arm cuff.  7.  HM-another order for colonoscopy provided along with GI scheduling phone number.  Recommend second shingles vaccine through local pharmacy.  8.  History of hyponatremia and renal insufficiency-in range in 1/25.  9.  Watery stools-reports he has trouble tolerating all of his medications and sometimes when he eats.  Reports sour stomach.  We will increase omeprazole from 40 mg daily to 40 mg twice daily, await colonoscopy, reassess at a later date.    F/u: 3 months A1c    Subject/Objective:     Chief Complaint   Patient presents with    Follow-up     Follow up hypertension?     S: 63 y.o. year old male with PMHx significant for   Past Medical History:   Diagnosis Date    Asthma     Asymptomatic human immunodeficiency virus (HIV) infection status (CMS-HCC) 04/09/2013    Diagnosed in 2005 with a CD4 of 32 and on at least one prior regimen on a study, then switched to Truvada with ritonavir boosted atazanavir. Changed to Truvada with boosted darunavir secondary to reflux symptoms. Has remained undetectable for several years with CD4 count greater than 500.      CAD (coronary artery disease)     Depression     Diabetes mellitus (CMS-HCC)     DVT (deep venous thrombosis) (CMS-HCC)     Eczema     Enlargement of liver     GERD (gastroesophageal reflux disease)     HIV (human immunodeficiency virus infection) (CMS-HCC)     Hyperlipidemia     Hypertension     Myocardial infarction (CMS-HCC)     Obesity     Pulmonary embolism (CMS-HCC) 2012    Sleep apnea    .     Patient Active Problem List   Diagnosis    Pulmonary embolism 1/12, long-term anticoag per hematology    Allergy to latex    Chronic cardiopulmonary disease (CMS-HCC)    Chronic diastolic heart failure (CMS-HCC)    Cytomegaloviral disease (CMS-HCC)    Dermatitis    Dysplasia of anus    Enthesopathy of knee    Esophageal reflux    Gout    Horseshoe tear of retina    Human immunodeficiency virus (HIV) disease (CMS-HCC)    Essential hypertension    Molluscum contagiosum    Mononeuritis    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 infarction    Convulsions (CMS-HCC)    Hereditary and idiopathic peripheral neuropathy    Inguinal hernia    Early syphilis, secondary syphilis    Coronary atherosclerosis (RAF-HCC)    Acid  reflux (RAF-HCC)    Chronic eczema    Depression    Allergic rhinitis    Uncontrolled type 2 diabetes mellitus    Diastasis recti    AIDS (CMS-HCC)    Obesity (BMI 30.0-34.9)    Disc disease, degenerative, lumbar or lumbosacral    'Light-for-dates' infant with signs of fetal malnutrition    Hyperlipidemia    Syncope    Dehydration    Diarrhea of presumed infectious origin    Acute renal failure (ARF) (CMS-HCC)       Presents for routine follow-up.       Your Medication List            Accurate as of March 09, 2024  2:03 PM. If you have any questions, ask your nurse or doctor.                CHANGE how you take these medications      omeprazole 40 MG capsule  Commonly known as: PriLOSEC  40mg  BID  What changed:   how much to take  how to take this  additional instructions            CONTINUE taking these medications      atorvastatin 40 MG tablet  Commonly known as: LIPITOR  Take 1 tablet (40 mg total) by mouth daily.     betamethasone dipropionate 0.05 % cream  Apply 1 Application topically two (2) times a day.     BIKTARVY 50-200-25 mg tablet  Generic drug: bictegrav-emtricit-tenofov ala  Take 1 tablet by mouth daily.     cetirizine 10 MG tablet  Commonly known as: ZYRTEC  Take 1 tablet (10 mg total) by mouth two (2) times a day.     ciclopirox 8 % solution  Commonly known as: PENLAC  Apply topically nightly. Apply over nail and surrounding skin. Apply daily over previous coat. After seven (7) days, may remove with alcohol and continue cycle.     colchicine 0.6 mg tablet  Commonly known as: COLCRYS  TAKE 1 TABLET BY MOUTH DAILY AS NEEDED FOR GOUT     doxycycline 100 MG capsule  Commonly known as: MONODOX  TAKE 1 CAPSULE BY MOUTH IN THE MORNING AND IN THE EVENING     DUPIXENT SYRINGE 300 mg/2 mL Syrg injection  Generic drug: dupilumab  Inject 2 mL (300 mg total) under the skin every fourteen (14) days. For maintenance dose     DUPIXENT SYRINGE 300 mg/2 mL Syrg injection  Generic drug: dupilumab  Inject the contents of 2 pens (600 mg) under the skin once for loading dose     ELIQUIS 5 mg Tab  Generic drug: apixaban  Take 1 tablet (5 mg total) by mouth two (2) times a day.     empagliflozin 25 mg tablet  Commonly known as: JARDIANCE  Take 1 tablet (25 mg total) by mouth daily.     furosemide 20 MG tablet  Commonly known as: LASIX  Take 1 tablet (20 mg total) by mouth daily.     hydrocortisone 2.5 % ointment  Apply 1 Application topically two (2) times a day. To active areas of the face until smooth     hydrOXYzine 25 MG tablet  Commonly known as: ATARAX  Take 1 tablet (25 mg total) by mouth every six (6) hours as needed.     lisinopril 10 MG tablet  Commonly known as: PRINIVIL,ZESTRIL  Take 1 tablet (10 mg total) by mouth daily.  methocarbamol 750 MG tablet  Commonly known as: ROBAXIN  750mg  4x daily prn muscle spasms     metoPROLOL succinate 25 MG 24 hr tablet  Commonly known as: Toprol-XL  Take 1 tablet (25 mg total) by mouth daily.     predniSONE 20 MG tablet  Commonly known as: DELTASONE  2 daily x 3 days, then 1 daily x 3 days then 1/2 daily x 4 days     RYBELSUS 14 mg Tab  Generic drug: semaglutide  Take 14 mg by mouth daily.     sertraline 50 MG tablet  Commonly known as: ZOLOFT  Take 1 tablet (50 mg total) by mouth daily.     triamcinolone 0.1 % ointment  Commonly known as: KENALOG  Apply topically two (2) times a day. To active areas of trunk and extremities until smooth     XARELTO 20 mg tablet  Generic drug: rivaroxaban              Allergies   Allergen Reactions    Latex Rash    Shellfish Containing Products      Other reaction(s): SHORTNESS OF BREATH    Metformin Nausea Only     Both IR and XR formulations    Both IR and XR formulations   Both IR and XR formulations    Both IR and XR formulations      Both IR and XR formulations Both IR and XR formulations    Penicillins Rash        Medication adherence and barriers to the treatment plan have been addressed. Opportunities to optimize healthy behaviors have been discussed. Patient / caregiver voiced understanding.      ROS: negative for vision changes, CP, abdominal pain, bowel/bladder changes, bleeding, lower extremity edema    O:  Vital Signs:    Vitals:    03/09/24 1336   BP: 144/73   Pulse: 108   Temp: 36.9 ??C (98.4 ??F)   TempSrc: Temporal   SpO2: 96%   Weight: (!) 109.8 kg (242 lb)   Height: 182.9 cm (6')       Physical Exam  General Appearance:    Alert, cooperative, no distress, appears stated age   Head:    Normocephalic, without obvious abnormality, atraumatic   Eyes:     Ears:     Throat:   Lips, mucosa, and tongue normal   Neck:   Supple, symmetrical, trachea midline,      No JVD bilaterally.   Lungs:     Clear to auscultation bilaterally, respirations unlabored   Chest Wall:    No tenderness or deformity    Heart:    Regular rate and rhythm, S1 and S2 normal, no murmur, rub    or gallop   Abdomen:     Soft, non-tender       Extremities: Extremities normal, atraumatic, no cyanosis or edema   Psych :  No agitation, anxiety, or depressed mood.      Note - This record has been created using [employer]. Chart creation errors have been sought, but may not always have been located. Such creation errors do not reflect on the standard of medical care.

## 2024-03-09 NOTE — Unmapped (Signed)
 [facility] at [facility]     Reason for visit: Follow up    Questions / Concerns that need to be addressed: hypertension     Screening BP- 144/73    108        PTHomeBP     Diabetes:  Regularly checking blood sugars?: no  If yes, when? Complete log for past 7 days  Date Before Breakfast After Breakfast Before Lunch After Lunch Before Dinner After Dinner Before [employer] or [employer] CGM in use? If so, pull appropriate reporting through portal ([employer]) or EPIC order [name]).    HCDM reviewed and updated in Epic:    We are working to make sure all of our patients??? wishes are updated in Epic and part of that is documenting a Environmental health practitioner for each patient  A Health Care Decision Maker is someone you choose who can make health care decisions for you if you are not able - who would you most want to do this for you????  is already up to date.    HCDM (patient stated preference): Ryan Davenport, Ryan Davenport - Mother - [phone]    BPAs completed:  Diabetes - POC A1C    Annual Screenings:   SDOH   __________________________________________________________________________________________    SCREENINGS COMPLETED IN FLOWSHEETS      AUDIT       PHQ2       PHQ9          GAD7       COPD Assessment       Falls Risk

## 2024-03-23 ENCOUNTER — Inpatient Hospital Stay: Admit: 2024-03-23 | Discharge: 2024-03-24

## 2024-03-23 LAB — VENOUS BLOOD GAS DRAW

## 2024-03-23 LAB — CBC W/ AUTO DIFF
HEMATOCRIT: 42.5 % (ref 39.0–48.0)
HEMOGLOBIN: 13.5 g/dL (ref 12.9–16.5)
MEAN CORPUSCULAR HEMOGLOBIN CONC: 31.7 g/dL — ABNORMAL LOW (ref 32.0–36.0)
MEAN CORPUSCULAR HEMOGLOBIN: 27.6 pg (ref 25.9–32.4)
MEAN CORPUSCULAR VOLUME: 87.2 fL (ref 77.6–95.7)
MEAN PLATELET VOLUME: 8.6 fL (ref 6.8–10.7)
NUCLEATED RED BLOOD CELLS: 1 /100{WBCs}
PLATELET COUNT: 216 10*9/L (ref 150–450)
RED BLOOD CELL COUNT: 4.88 10*12/L (ref 4.26–5.60)
RED CELL DISTRIBUTION WIDTH: 15.8 % — ABNORMAL HIGH (ref 12.2–15.2)
WBC ADJUSTED: 4.8 10*9/L (ref 3.6–11.2)

## 2024-03-23 LAB — MANUAL DIFFERENTIAL
BANDS - REL (DIFF): 1 %
BASOPHILS - ABS (DIFF): 0 10*9/L (ref 0.0–0.2)
BASOPHILS - REL (DIFF): 1 %
EOSINOPHILS - ABS (DIFF): 0.1 10*9/L (ref 0.0–0.4)
EOSINOPHILS - REL (DIFF): 2 %
LYMPHOCYTES - ABS (DIFF): 1.7 10*9/L (ref 0.7–3.1)
LYMPHOCYTES - REL (DIFF): 35 %
MONOCYTES - ABS (DIFF): 0 10*9/L — ABNORMAL LOW (ref 0.1–0.9)
MONOCYTES - REL (DIFF): 1 %
NEUTROPHILS - ABS (DIFF): 2.9 10*9/L (ref 1.4–7.0)
NEUTROPHILS - REL (DIFF): 60 %

## 2024-03-23 LAB — COMPREHENSIVE METABOLIC PANEL
ALBUMIN: 3.9 g/dL (ref 3.4–5.0)
ALKALINE PHOSPHATASE: 128 U/L — ABNORMAL HIGH (ref 38–126)
ALT (SGPT): 27 U/L (ref <50)
ANION GAP: 13 mmol/L (ref 5–14)
AST (SGOT): 30 U/L (ref 19–55)
BILIRUBIN TOTAL: 0.4 mg/dL (ref 0.1–1.2)
BLOOD UREA NITROGEN: 12 mg/dL (ref 7–21)
BUN / CREAT RATIO: 9
CALCIUM: 9 mg/dL (ref 8.5–10.2)
CHLORIDE: 95 mmol/L — ABNORMAL LOW (ref 98–107)
CO2: 22 mmol/L (ref 22.0–32.0)
CREATININE: 1.3 mg/dL (ref 0.70–1.30)
EGFR CKD-EPI (2021) MALE: 63 mL/min/1.73m2 (ref >=60)
GLUCOSE RANDOM: 975 mg/dL (ref 74–106)
POTASSIUM: 4 mmol/L (ref 3.5–5.0)
PROTEIN TOTAL: 8.7 g/dL — ABNORMAL HIGH (ref 6.5–8.3)
SODIUM: 130 mmol/L — ABNORMAL LOW (ref 135–145)

## 2024-03-23 LAB — LACTATE, PLASMA: LACTATE: 2.4 mmol/L (ref 0.7–2.0)

## 2024-03-23 LAB — MAGNESIUM: MAGNESIUM: 1.7 mg/dL (ref 1.6–2.2)

## 2024-03-23 LAB — GLUCOSE, RANDOM: GLUCOSE RANDOM: 864 mg/dL (ref 74–106)

## 2024-03-23 LAB — ACETONE, QUALITATIVE, SERUM: ACETEST: NEGATIVE

## 2024-03-23 NOTE — Unmapped (Addendum)
 [facility] EMERGENCY DEPARTMENT ENCOUNTER      Clinical Impression     Final diagnoses:   None     Assessment and Plan     Initial Impression, including Differential Diagnosis and Plan of Care    Ryan Davenport is a 63 y.o. male with PMH of asthma, CAD, depression, T2DM, DVT, GERD, HIV, HLD, HTN, MI, and PE who presents with right wrist pain, which began earlier today. Pt additionally states he has felt extreme thirst for the last three months.     Per triage vials, pt was hypertensive (160/110) and tachycardic (110 bpm).     Triage investigations included Lactic Acid, CMP, Magnesium, CBC, which showed electrolyte imbalance. Elevated glucose (975).    On physical exam, pt is alert and oriented, lying in bed, speaking in full sentences and in no acute distress. He has a myoclonic tremor in the left hand and wrist. His heart is tachycardic, around 110 bpm. Pt also has very dry mucous membranes.     Plan for labs to evaluate for acidosis, ketosis, infection, and anticipate need for admission.     Remarkably, patient does not have acidosis nor ketosis, despite initial glucose at 975.    IV fluids initiated, potassium replacement, calcium and magnesium are relatively normal, despite the tetany he is experiencing in his right hand.  Muscle relaxants given to try and relieve this symptom as well.    On repeat lab draw, he is trending down slightly, into the 800s.  I will give 5 units of subcu regular insulin, work towards admission.  No evidence of syndrome, severe infectious process, or other factors driving his profound hyperglycemia.    Case discussed with Dr. Bernardo Heater, who accepts him for admission here to [facility].  Will continue to monitor glucose hourly, with goal of 50% reduction in the next 24 hours, but relative slow drop given suspected chronicity of his hyperglycemia.  We will work with him to begin getting back on his baseline home medications.    Critical Care    Performed by: Norva Riffle, MD  Authorized by: Tawni Levy, MD    Critical care provider statement:     Critical care time (minutes):  45    Critical care was necessary to treat or prevent imminent or life-threatening deterioration of the following conditions:  Endocrine crisis    Critical care was time spent personally by me on the following activities:  Development of treatment plan with patient or surrogate, evaluation of patient's response to treatment, obtaining history from patient or surrogate, ordering and performing treatments and interventions, ordering and review of laboratory studies and re-evaluation of patient's condition     __________________________________________    Time seen: March 23, 2024 8:50 PM    I have reviewed the triage vital signs and the nursing notes.    History      Chief Complaint   Patient presents with    Hand Pain     HPI   Ryan Davenport is a 63 y.o. male with PMH of asthma, CAD, depression, T2DM, DVT, GERD, HIV, HLD, HTN, MI, and PE who presents with right wrist pain, which began earlier today. Pt additionally states he has felt extreme thirst for the last three months. He has talked to his PCP, Dr. Donna Christen, who recommended having a colonoscopy. Pt states he has been drinking a lot of water to try to combat this.    Pt has not been taking any of his  medications for the last month. He reports taking his medications caused sever diarrhea, which is why he stopped. He intends to restart his medication soon. Pt still has diarrhea. He reports it has been black, brown, green, and watery. Pt reports if he eats regularly, his BMs come out regularly. He additionally endorses dry mouth. He denies weight loss.      PAST MEDICAL HISTORY    Past Medical History:   Diagnosis Date    Asthma     Asymptomatic human immunodeficiency virus (HIV) infection status 04/09/2013    Diagnosed in 2005 with a CD4 of 32 and on at least one prior regimen on a study, then switched to Truvada with ritonavir boosted atazanavir. Changed to Truvada with boosted darunavir secondary to reflux symptoms. Has remained undetectable for several years with CD4 count greater than 500.      CAD (coronary artery disease)     Depression     Diabetes mellitus     DVT (deep venous thrombosis)     Eczema     Enlargement of liver     GERD (gastroesophageal reflux disease)     HIV (human immunodeficiency virus infection)     Hyperlipidemia     Hypertension     Myocardial infarction     Obesity     Pulmonary embolism 2012    Sleep apnea        SURGICAL HISTORY    Past Surgical History:   Procedure Laterality Date    PR COLONOSCOPY FLX DX W/COLLJ SPEC WHEN PFRMD N/A 11/11/2013    Procedure: COLONOSCOPY, FLEXIBLE, PROXIMAL TO SPLENIC FLEXURE; DIAGNOSTIC, W/WO COLLECTION SPECIMEN BY BRUSH OR WASH;  Surgeon: Romero Belling, MD;  Location: [facility];  Service: Gastroenterology    PR UPPER GI ENDOSCOPY,DIAGNOSIS N/A 04/01/2019    Procedure: UGI ENDO, INCLUDE ESOPHAGUS, STOMACH, & DUODENUM &/OR JEJUNUM; DX W/WO COLLECTION SPECIMN, BY BRUSH OR WASH;  Surgeon: Leland Her, MD;  Location: [facility];  Service: Gastroenterology    SKIN BIOPSY         CURRENT MEDICATIONS    No current facility-administered medications for this encounter.    Current Outpatient Medications:     atorvastatin (LIPITOR) 40 MG tablet, Take 1 tablet (40 mg total) by mouth daily., Disp: 90 tablet, Rfl: 3    betamethasone dipropionate 0.05 % cream, Apply 1 Application topically two (2) times a day., Disp: , Rfl:     BIKTARVY 50-200-25 mg tablet, Take 1 tablet by mouth daily., Disp: 90 tablet, Rfl: 3    cetirizine (ZYRTEC) 10 MG tablet, Take 1 tablet (10 mg total) by mouth two (2) times a day., Disp: 60 tablet, Rfl: 11    ciclopirox (PENLAC) 8 % solution, Apply topically nightly. Apply over nail and surrounding skin. Apply daily over previous coat. After seven (7) days, may remove with alcohol and continue cycle., Disp: 6.6 mL, Rfl: 6    colchicine (COLCRYS) 0.6 mg tablet, TAKE 1 TABLET BY MOUTH DAILY AS NEEDED FOR GOUT, Disp: 90 tablet, Rfl: 3    doxycycline (MONODOX) 100 MG capsule, TAKE 1 CAPSULE BY MOUTH IN THE MORNING AND IN THE EVENING, Disp: , Rfl:     dupilumab (DUPIXENT SYRINGE) 300 mg/2 mL Syrg injection, Inject 2 mL (300 mg total) under the skin every fourteen (14) days. For maintenance dose, Disp: 4 mL, Rfl: 11    dupilumab (DUPIXENT SYRINGE) 300 mg/2 mL Syrg injection, Inject the contents of 2 pens (600 mg) under  the skin once for loading dose, Disp: 4 mL, Rfl: 0    ELIQUIS 5 mg Tab, Take 1 tablet (5 mg total) by mouth two (2) times a day., Disp: , Rfl:     empagliflozin (JARDIANCE) 25 mg tablet, Take 1 tablet (25 mg total) by mouth daily., Disp: 90 tablet, Rfl: 3    furosemide (LASIX) 20 MG tablet, Take 1 tablet (20 mg total) by mouth daily., Disp: , Rfl:     hydrocortisone 2.5 % ointment, Apply 1 Application topically two (2) times a day. To active areas of the face until smooth, Disp: 454 g, Rfl: 5    hydrOXYzine (ATARAX) 25 MG tablet, Take 1 tablet (25 mg total) by mouth every six (6) hours as needed., Disp: , Rfl:     lisinopril (PRINIVIL,ZESTRIL) 10 MG tablet, Take 1 tablet (10 mg total) by mouth daily., Disp: 90 tablet, Rfl: 3    methocarbamoL (ROBAXIN) 750 MG tablet, 750mg  4x daily prn muscle spasms, Disp: 60 tablet, Rfl: 2    metoprolol succinate (TOPROL-XL) 25 MG 24 hr tablet, Take 1 tablet (25 mg total) by mouth daily., Disp: 90 tablet, Rfl: 3    omeprazole (PRILOSEC) 40 MG capsule, 40mg  BID, Disp: 180 capsule, Rfl: 3    predniSONE (DELTASONE) 20 MG tablet, 2 daily x 3 days, then 1 daily x 3 days then 1/2 daily x 4 days (Patient not taking: Reported on 03/09/2024), Disp: 20 tablet, Rfl: 0    semaglutide (RYBELSUS) 14 mg Tab, Take 14 mg by mouth daily., Disp: 30 tablet, Rfl: 11    sertraline (ZOLOFT) 50 MG tablet, Take 1 tablet (50 mg total) by mouth daily., Disp: 90 tablet, Rfl: 3    triamcinolone (KENALOG) 0.1 % ointment, Apply topically two (2) times a day. To active areas of trunk and extremities until smooth, Disp: 454 g, Rfl: 5    XARELTO 20 mg tablet, , Disp: , Rfl:     ALLERGIES    Allergies   Allergen Reactions    Latex Rash    Shellfish Containing Products      Other reaction(s): SHORTNESS OF BREATH    Metformin Nausea Only     Both IR and XR formulations    Both IR and XR formulations   Both IR and XR formulations    Both IR and XR formulations      Both IR and XR formulations Both IR and XR formulations    Penicillins Rash       FAMILY HISTORY    Family History   Problem Relation Age of Onset    Diabetes Mother     Heart disease Mother     Hypertension Mother     Arthritis Mother     Aneurysm Father     Skin cancer Neg Hx     Melanoma Neg Hx     Basal cell carcinoma Neg Hx     Squamous cell carcinoma Neg Hx        SOCIAL HISTORY  Social History     Socioeconomic History    Marital status: Single     Spouse name: None    Number of children: None    Years of education: None    Highest education level: None   Tobacco Use    Smoking status: Never     Passive exposure: Never    Smokeless tobacco: Never   Vaping Use    Vaping status: Never Used   Substance and Sexual Activity  Alcohol use: No     Alcohol/week: 0.0 standard drinks of alcohol    Drug use: No    Sexual activity: Not Currently     Partners: Male   Other Topics Concern    Do you use sunscreen? No    Tanning bed use? No    Are you easily burned? No    Excessive sun exposure? No    Blistering sunburns? No   Social History Narrative    Single.    No children    Works 2 days per week.  [employer] in [employer]     Social Drivers of Health     Financial Resource Strain: Low Risk  (07/31/2023)    Overall Financial Resource Strain (CARDIA)     Difficulty of Paying Living Expenses: Not hard at all   Food Insecurity: No Food Insecurity (03/09/2024)    Hunger Vital Sign     Worried About Running Out of Food in the Last Year: Never true     Ran Out of Food in the Last Year: Never true   Transportation Needs: No Transportation Needs (03/09/2024)    PRAPARE - Therapist, art (Medical): No     Lack of Transportation (Non-Medical): No    Received from [facility], [facility]    Social Network   Housing: Low Risk  (03/09/2024)    Housing     Within the past 12 months, have you ever stayed: outside, in a car, in a tent, in an overnight shelter, or temporarily in someone else's home (i.e. couch-surfing)?: No     Are you worried about losing your housing?: No     Review of Systems     Patient denies fever, chills, focal weakness  No headache  No eye pain or vision changes  No ear pain or decreased hearing  No sore throat or difficulty swallowing  No chest pain  No cough or shortness of breath  Positive diarrhea. No nausea, vomiting, or bloody stools  Positive right wrist pain/involuntary muscle contraction. No swelling  No skin rashes or lesions  No syncope or seizure activity  Denies depressed mood or thought disturbance    10 point ROS negative except as marked above and in HPI.    Physical Exam     VITAL SIGNS:    ED Triage Vitals [03/23/24 1949]   Enc Vitals Group      BP 160/110      Heart Rate 110      SpO2 Pulse       Resp 20      Temp 36.4 ??C (97.6 ??F)      Temp Source Temporal      SpO2 99 %      Weight (!) 109.8 kg (242 lb)      Height 1.829 m (6')       General: alert and oriented, resting comfortably in no distress  Oral: Dry mucous membranes, benign oropharynx  Nose: Nares clear without discharge  Neck: Neck supple without nodes, normal thyroid  Cardiac: Tachycardic around 110 bpm. Regular rhythm without murmurs, no JVD, no bruits, 2+ peripheral pulses  Pulm: Lungs clear in all fields with easy work of breathing, no W/R/C  Abd: Soft, non-tender, not distended, normal bowel sounds, no HSM, no CVA tenderness  Vascular: Ext warm and well perfused, no peripheral edema.   Extremities: Myoclonic jerking in right hand and wrist.  No painful or swollen joints, ambulates without assistance  Skin: warm, dry, intact.  No rashes, lesions, or open sores  Neuro: No focal deficits  Psych: clear and appropriate, full affect, denies depressed mood    Results     EKG   Normal sinus rhythm, rate 99, QTc 449.  Hyperdynamic T waves, no obvious ST changes to suggest acute ischemia, LVH by amplitude.    RADIOLOGY    ECG 12 Lead  Result Date: 03/24/2024  NORMAL SINUS RHYTHM INCOMPLETE RIGHT BUNDLE BRANCH BLOCK MINIMAL VOLTAGE CRITERIA FOR LVH, MAY BE NORMAL VARIANT ( [facility] product ) BORDERLINE ECG      LABS  Recent Results (from the past 24 hours)   Comprehensive Metabolic Panel    Collection Time: 03/23/24  7:52 PM   Result Value Ref Range    Sodium 130 (L) 135 - 145 mmol/L    Potassium 4.0 3.5 - 5.0 mmol/L    Chloride 95 (L) 98 - 107 mmol/L    CO2 22.0 22.0 - 32.0 mmol/L    Anion Gap 13 5 - 14 mmol/L    BUN 12 7 - 21 mg/dL    Creatinine 0.96 0.45 - 1.30 mg/dL    BUN/Creatinine Ratio 9     eGFR CKD-EPI (2021) Male 63 >=60 mL/min/1.77m2    Glucose 975 (HH) 74 - 106 mg/dL    Calcium 9.0 8.5 - 40.9 mg/dL    Albumin 3.9 3.4 - 5.0 g/dL    Total Protein 8.7 (H) 6.5 - 8.3 g/dL    Total Bilirubin 0.4 0.1 - 1.2 mg/dL    AST 30 19 - 55 U/L    ALT 27 <50 U/L    Alkaline Phosphatase 128 (H) 38 - 126 U/L   Magnesium    Collection Time: 03/23/24  7:52 PM   Result Value Ref Range    Magnesium 1.7 1.6 - 2.2 mg/dL   CBC w/ Differential    Collection Time: 03/23/24  7:52 PM   Result Value Ref Range    Results Verified by Slide Scan Slide Reviewed     WBC 4.8 3.6 - 11.2 10*9/L    RBC 4.88 4.26 - 5.60 10*12/L    HGB 13.5 12.9 - 16.5 g/dL    HCT 81.1 91.4 - 78.2 %    MCV 87.2 77.6 - 95.7 fL    MCH 27.6 25.9 - 32.4 pg    MCHC 31.7 (L) 32.0 - 36.0 g/dL    RDW 95.6 (H) 21.3 - 15.2 %    MPV 8.6 6.8 - 10.7 fL    Platelet 216 150 - 450 10*9/L    nRBC 1 /100 WBCs    Anisocytosis Slight (A) Not Present   Manual Differential    Collection Time: 03/23/24  7:52 PM   Result Value Ref Range Neutrophils % 60 %    Lymphocytes % 35 %    Monocytes % 1 %    Eosinophils % 2 %    Basophils % 1 %    Bands % 1 %    Absolute Neutrophils 2.9 1.4 - 7.0 10*9/L    Absolute Lymphocytes 1.7 0.7 - 3.1 10*9/L    Absolute Monocytes 0.0 (L) 0.1 - 0.9 10*9/L    Absolute Eosinophils 0.1 0.0 - 0.4 10*9/L    Absolute Basophils 0.0 0.0 - 0.2 10*9/L   Lactic Acid, Plasma    Collection Time: 03/23/24  7:53 PM   Result Value Ref Range    Lactate 2.4 (HH) 0.7 - 2.0 mmol/L   Acetone, qualitative, serum  Collection Time: 03/23/24  7:53 PM   Result Value Ref Range    ACETEST Negative Negative   ECG 12 Lead    Collection Time: 03/23/24  9:14 PM   Result Value Ref Range    EKG Systolic BP  mmHg    EKG Diastolic BP  mmHg    EKG Ventricular Rate 99 BPM    EKG Atrial Rate 99 BPM    EKG P-R Interval 168 ms    EKG QRS Duration 96 ms    EKG Q-T Interval 350 ms    EKG QTC Calculation 449 ms    EKG Calculated P Axis 57 degrees    EKG Calculated R Axis -13 degrees    EKG Calculated T Axis 48 degrees    QTC Fredericia 413 ms   Venous Blood Gas Draw    Collection Time: 03/23/24 10:16 PM   Result Value Ref Range    VENOUS SAMPLE Collected    Glucose, Random    Collection Time: 03/23/24 10:16 PM   Result Value Ref Range    Glucose 864 (HH) 74 - 106 mg/dL   POCT VENOUS BLOOD GAS    Collection Time: 03/23/24 10:25 PM   Result Value Ref Range    pH, Venous 7.32 7.32 - 7.43    pCO2, Ven 45.8 42.0 - 51.0 mmHg    pO2, Ven 45.2 (H) 35.0 - 42.0 mmHg    pH Venous, Temp Corrected      PCO2 Venous, Temp Corrected      PO2 Venous, Temp Corrected      HCO3, Ven 23.5 21.0 - 28.0 mmol/L    Base Excess, Ven -2.9 (L) -2.0 - 3.0 mmol/L    O2 Saturation, Venous 76.8 68.0 - 77.0 %    Total CO2, Venous POC 24.90 1.00 - 85.00 mmHg    PATIENT TEMPERATURE      ALLEN TEST N/A     DELIVERY SYSTEM      VENT MODE      FIO2      TIDAL VOLUME      RESPIRATORY RATE      TOTAL RATE      PEEP      PRESSURE SUPPORT 741.00 cm    Venous Comment     Urinalysis with Microscopy Collection Time: 03/23/24 11:27 PM   Result Value Ref Range    Color, UA Yellow     Clarity, UA Clear     Specific Gravity, UA <=1.005 1.005 - 1.030    pH, UA 6.0 5.0 - 7.0    Leukocyte Esterase, UA Negative Negative    Nitrite, UA Negative Negative    Protein, UA >300 mg/dl (A) Negative    Glucose, UA >1000 mg/dL (A) Negative    Ketones, UA Negative Negative    Urobilinogen, UA 0.2 mg/dL     Bilirubin, UA Negative Negative    Blood, UA Trace (A) Negative    RBC, UA 5 (H) 0 - 3 /HPF    WBC, UA 0 0 - 3 /HPF    Squam Epithel, UA 3 0 - 5 /HPF    Bacteria, UA Occasional (A) None Seen /HPF    Mucus, UA Occasional (A) None Seen /HPF   POCT Glucose    Collection Time: 03/23/24 11:48 PM   Result Value Ref Range    Glucose, POC >600 (HH) 65 - 179 mg/dL   POCT Glucose    Collection Time: 03/24/24  1:05 AM   Result Value Ref Range  Glucose, POC >600 (HH) 65 - 179 mg/dL   POCT Glucose    Collection Time: 03/24/24  2:17 AM   Result Value Ref Range    Glucose, POC 524 (HH) 65 - 179 mg/dL   POCT Glucose    Collection Time: 03/24/24  4:08 AM   Result Value Ref Range    Glucose, POC 476 (H) 65 - 179 mg/dL   POCT Glucose    Collection Time: 03/24/24  5:10 AM   Result Value Ref Range    Glucose, POC 472 (H) 65 - 179 mg/dL         Procedures     None    Attestations     Portions of this record have been created using [employer], clinical (histocompatibility and immunogenetics). Dictation errors have been sought, but may not have been identified and corrected.    The chart was initially created by Varshini Venuturupalle using scribe services on March 23, 2024 8:50 PM, on behalf of Larna Daughters, MD.    Documentation assistance was provided by the scribe in my presence. The documentation recorded by the scribe has been reviewed by me, amended with [employer], and accurately reflects the services I personally performed.      Belanna Manring, Duffy Brannan, MD  03/24/24 [id number]       Norva Riffle, MD  03/24/24 [phone]

## 2024-03-23 NOTE — Unmapped (Signed)
 Intermittent cramping sensation to right hand since this AM.

## 2024-03-24 LAB — PHOSPHORUS: PHOSPHORUS: 3.4 mg/dL (ref 2.9–4.7)

## 2024-03-24 LAB — CBC
HEMATOCRIT: 34 % — ABNORMAL LOW (ref 39.0–48.0)
HEMOGLOBIN: 11.3 g/dL — ABNORMAL LOW (ref 12.9–16.5)
MEAN CORPUSCULAR HEMOGLOBIN CONC: 33.1 g/dL (ref 32.0–36.0)
MEAN CORPUSCULAR HEMOGLOBIN: 27.2 pg (ref 25.9–32.4)
MEAN CORPUSCULAR VOLUME: 82 fL (ref 77.6–95.7)
MEAN PLATELET VOLUME: 8.3 fL (ref 6.8–10.7)
PLATELET COUNT: 190 10*9/L (ref 150–450)
RED BLOOD CELL COUNT: 4.14 10*12/L — ABNORMAL LOW (ref 4.26–5.60)
RED CELL DISTRIBUTION WIDTH: 16.4 % — ABNORMAL HIGH (ref 12.2–15.2)
WBC ADJUSTED: 4.4 10*9/L (ref 3.6–11.2)

## 2024-03-24 LAB — URINALYSIS WITH MICROSCOPY
BILIRUBIN UA: NEGATIVE
GLUCOSE UA: >1000 — AB
KETONES UA: NEGATIVE
LEUKOCYTE ESTERASE UA: NEGATIVE
NITRITE UA: NEGATIVE
PH UA: 6 (ref 5.0–7.0)
PROTEIN UA: >300 — AB
RBC UA: 5 /HPF — ABNORMAL HIGH (ref 0–3)
SPECIFIC GRAVITY UA: <=1.005 (ref 1.005–1.030)
SQUAMOUS EPITHELIAL: 3 /HPF (ref 0–5)
UROBILINOGEN UA: 0.2
WBC UA: 0 /HPF (ref 0–3)

## 2024-03-24 LAB — HEMOGLOBIN A1C
ESTIMATED AVERAGE GLUCOSE: 298 mg/dL
HEMOGLOBIN A1C: 12 % — ABNORMAL HIGH (ref 4.8–5.6)

## 2024-03-24 LAB — BASIC METABOLIC PANEL
ANION GAP: 10 mmol/L (ref 5–14)
BLOOD UREA NITROGEN: 9 mg/dL (ref 7–21)
BUN / CREAT RATIO: 10
CALCIUM: 8.4 mg/dL — ABNORMAL LOW (ref 8.5–10.2)
CHLORIDE: 104 mmol/L (ref 98–107)
CO2: 26 mmol/L (ref 22.0–32.0)
CREATININE: 0.9 mg/dL (ref 0.70–1.30)
EGFR CKD-EPI (2021) MALE: >90 mL/min/1.73m2 (ref >=60)
GLUCOSE RANDOM: 493 mg/dL — ABNORMAL HIGH (ref 74–106)
POTASSIUM: 3.6 mmol/L (ref 3.5–5.0)
SODIUM: 140 mmol/L (ref 135–145)

## 2024-03-24 LAB — MAGNESIUM: MAGNESIUM: 1.6 mg/dL (ref 1.6–2.2)

## 2024-03-24 MED ORDER — SERTRALINE 50 MG TABLET
ORAL_TABLET | Freq: Every day | ORAL | 0 refills | 30 days | Status: CP
Start: 2024-03-24 — End: 2024-04-23

## 2024-03-24 MED ORDER — LISINOPRIL 10 MG TABLET
ORAL_TABLET | Freq: Every day | ORAL | 0 refills | 30 days | Status: CP
Start: 2024-03-24 — End: 2024-04-23

## 2024-03-24 MED ORDER — PEN NEEDLE, DIABETIC 32 GAUGE X 5/32" (4 MM)
0 refills | 0 days | Status: CP
Start: 2024-03-24 — End: ?

## 2024-03-24 MED ORDER — INSULIN GLARGINE (U-100) 100 UNIT/ML (3 ML) SUBCUTANEOUS PEN
Freq: Every evening | SUBCUTANEOUS | 0 refills | 30 days | Status: CP
Start: 2024-03-24 — End: 2024-04-23

## 2024-03-24 MED ORDER — BLOOD-GLUCOSE METER KIT WRAPPER
0 refills | 0 days | Status: CP
Start: 2024-03-24 — End: 2025-03-24

## 2024-03-24 MED ORDER — LANCETS
0 refills | 0 days | Status: CP
Start: 2024-03-24 — End: ?

## 2024-03-24 MED ORDER — BLOOD GLUCOSE TEST STRIPS
ORAL_STRIP | 0 refills | 0 days | Status: CP
Start: 2024-03-24 — End: ?

## 2024-03-24 MED ADMIN — empagliflozin (JARDIANCE) tablet 10 mg: 10 mg | ORAL | @ 11:00:00 | Stop: 2024-03-24

## 2024-03-24 MED ADMIN — sodium chloride 0.9 % with KCl 40 mEq/L infusion: 250 mL/h | INTRAVENOUS | @ 02:00:00 | Stop: 2024-03-23

## 2024-03-24 MED ADMIN — metoPROLOL succinate (Toprol-XL) 24 hr tablet 25 mg: 25 mg | ORAL | @ 02:00:00 | Stop: 2024-03-23

## 2024-03-24 MED ADMIN — lactated ringers bolus 1,000 mL: 1000 mL | INTRAVENOUS | @ 04:00:00

## 2024-03-24 MED ADMIN — insulin lispro (HumaLOG) injection 0-20 Units: 0-20 [IU] | SUBCUTANEOUS | @ 12:00:00 | Stop: 2024-03-24

## 2024-03-24 MED ADMIN — sodium chloride 0.9% (NS) bolus 1,000 mL: 1000 mL | INTRAVENOUS | @ 02:00:00 | Stop: 2024-03-23

## 2024-03-24 MED ADMIN — methocarbamol (ROBAXIN) tablet 500 mg: 500 mg | ORAL | @ 02:00:00 | Stop: 2024-03-23

## 2024-03-24 MED ADMIN — insulin glargine (BASAGLAR, LANTUS) injection pen 10 Units: 10 [IU] | SUBCUTANEOUS | @ 04:00:00

## 2024-03-24 MED ADMIN — apixaban (ELIQUIS) tablet 5 mg: 5 mg | ORAL | @ 02:00:00 | Stop: 2024-03-23

## 2024-03-24 MED ADMIN — lactated Ringers infusion: 125 mL/h | INTRAVENOUS | @ 07:00:00 | Stop: 2024-03-24

## 2024-03-24 MED ADMIN — insulin regular (HumuLIN,NovoLIN) injection 5 Units: 5 [IU] | SUBCUTANEOUS | @ 04:00:00 | Stop: 2024-03-24

## 2024-03-24 MED ADMIN — potassium chloride ER tablet 40 mEq: 40 meq | ORAL | @ 11:00:00 | Stop: 2024-03-24

## 2024-03-24 NOTE — Unmapped (Signed)
 Ryan Davenport Physician Discharge Summary     Admit date: 03/23/2024  Discharge date: 03/24/2024  Discharge Dav[place]: [facility]  Admitting Provider: Tawni Levy, MD  Discharge Attending Physician: No att. providers found  Discharge to: Home  Consults:   PCP: Julieanne Cotton, PA    Discharge Diagnoses:   Principal Problem:    Hyperglycemia  Active Problems:    Chronic cardiopulmonary disease    Chronic diastolic heart failure    Essential hypertension    Coronary atherosclerosis (RAF-HCC)    Depression    Type 2 diabetes mellitus    AIDS    Obesity (BMI 30.0-34.9)        Hospital Course:      PCP Follow Up:  [ ]  Titrate insulin   [ ]  Re-start GLP-1 if able   [ ]  Monitor re-starting of HTN regimen, adjust as needed     Ryan Davenport is a 63 y.o. male with PMHX of  HIV, T2DM, HTN, CAD who presented with R wrist pain and 3 months increased thirst.     #Hyperglycemia   #T2DM   Pt found to have BG >900 on presentation, however no anion gap or acidosis on initial labs. Per patient he has not taken any medications in about a month because he was having increased abd pain and wasn't sure which medication might be causing it. He had been prescribed Rybelsus and Jardiance and had A1C 8.2 at PCP visit 2 weeks ago. A1C on presentation now 12. He denies any infectious symptoms and has no signs of infection on evaluation. Hyperglycemia likely 2/2 no medications for at least a month. Pt blood sugar improved w/ 2 L of fluids and insulin (received 10 units lantus and 5u regular insulin in ED). He is open to re-starting insulin (had been on insulin several years ago. Made plan to re-start at 12u lantus nightly and gave patient instructions on how to titrate insulin at home. Holding Rybelsus for now in case this was contributing to patient's abdomina symptoms that caused him to stop all meds. Defer to PCP to re-start.     #Elevated Cr  Cr initially 1.3 on presentation. Likely pre-renal in setting of hyperglycemia and dehydration. Improved to 0.9 after IVF.      #R wrist pain   Initially presented for R wrist pain and cramping. Now resolved this AM after fluids. Likely related to dehydration. Encouraged ongoing PO hydration.      #HTN   Hypertensive to 160s/110s on presentation. Pt reports taking no medications for ~1 month. Will re-start home lisinopril 10mg  and defer to PCP to adjust further.          Procedures:    None    ___________________________________________________________________  Discharge Day Services:    Pt seen on the day of discharge and determined appropriate for discharge.    Day of Discharge Services:    Subjective:     Interval History: Pt reports feeling well this AM. R wrist cramping has resolved. Open to discharging with insulin.      ROS negative for fever, chills, night sweats, nausea, vomiting, diarrhea, constipation, chest pain, SOB, DOE, Abdominal pain, dysuria, rash, vision changes, headache, unilateral weakness, numbness or tingling    Objective:     Vital signs in last 24 hours:  Temp:  [36.1 ??C (96.9 ??F)-36.4 ??C (97.6 ??F)] 36.1 ??C (96.9 ??F)  Heart Rate:  [85-110] 94  SpO2 Pulse:  [85-88] 87  Resp:  [20] 20  BP: (134-170)/(85-110) 151/96  MAP (mmHg):  [102-119] 119  SpO2:  [94 %-99 %] 96 %  BMI (Calculated):  [32.81] 32.81    Intake/Output last 3 shifts:  No intake/output data recorded.    Weight:   Wt Readings from Last 4 Encounters:   03/23/24 (!) 109.8 kg (242 lb)   03/09/24 (!) 109.8 kg (242 lb)   12/31/23 (!) 111.1 kg (245 lb)   12/11/23 (!) 112 kg (247 lb)       Physical Exam  Constitutional:       General: He is not in acute distress.     Appearance: Normal appearance. He is obese. He is not ill-appearing.   HENT:      Head: Normocephalic.      Mouth/Throat:      Mouth: Mucous membranes are moist.      Pharynx: Oropharynx is clear.   Eyes:      Extraocular Movements: Extraocular movements intact.      Conjunctiva/sclera: Conjunctivae normal. Cardiovascular:      Rate and Rhythm: Normal rate.   Pulmonary:      Effort: Pulmonary effort is normal. No respiratory distress.   Musculoskeletal:         General: Normal range of motion.   Neurological:      Mental Status: He is alert. Mental status is at baseline.   Psychiatric:         Mood and Affect: Mood normal.         Behavior: Behavior normal.        Condition at Discharge: good    Length of Discharge: I spent less than 30 mins in the discharge of this patient.  ___________________________________________________________________  Discharge Medications:         Your Medication List        PAUSE taking these medications      RYBELSUS 14 mg Tab  Wait to take this until your doctor or other care provider tells you to start again.  Generic drug: semaglutide  Take 14 mg by mouth daily.            STOP taking these medications      ciclopirox 8 % solution  Commonly known as: PENLAC     doxycycline 100 MG capsule  Commonly known as: MONODOX     predniSONE 20 MG tablet  Commonly known as: DELTASONE            START taking these medications      blood-glucose meter kit  Use as instructed     glucose blood test strip  Generic drug: blood sugar diagnostic  Use to check blood sugar as directed with insulin 3 times a day & for symptoms of high or low blood sugar.     insulin glargine 100 unit/mL (3 mL) injection pen  Commonly known as: BASAGLAR, LANTUS  Inject 0.12 mL (12 Units total) under the skin nightly.     lancets Misc  Use to check blood sugar as directed with insulin 3 times a day & for symptoms of high or low blood sugar.     pen needle, diabetic 32 gauge x 5/32 (4 mm) Ndle  Use with insulin up to 4 times/day as needed.            CONTINUE taking these medications      atorvastatin 40 MG tablet  Commonly known as: LIPITOR  Take 1 tablet (40 mg total) by mouth daily.     betamethasone dipropionate 0.05 %  cream  Apply 1 Application topically two (2) times a day.     BIKTARVY 50-200-25 mg tablet  Generic drug: bictegrav-emtricit-tenofov ala  Take 1 tablet by mouth daily.     cetirizine 10 MG tablet  Commonly known as: ZYRTEC  Take 1 tablet (10 mg total) by mouth two (2) times a day.     colchicine 0.6 mg tablet  Commonly known as: COLCRYS  TAKE 1 TABLET BY MOUTH DAILY AS NEEDED FOR GOUT     DUPIXENT SYRINGE 300 mg/2 mL Syrg injection  Generic drug: dupilumab  Inject 2 mL (300 mg total) under the skin every fourteen (14) days. For maintenance dose     DUPIXENT SYRINGE 300 mg/2 mL Syrg injection  Generic drug: dupilumab  Inject the contents of 2 pens (600 mg) under the skin once for loading dose     ELIQUIS 5 mg Tab  Generic drug: apixaban  Take 1 tablet (5 mg total) by mouth two (2) times a day.     empagliflozin 25 mg tablet  Commonly known as: JARDIANCE  Take 1 tablet (25 mg total) by mouth daily.     furosemide 20 MG tablet  Commonly known as: LASIX  Take 1 tablet (20 mg total) by mouth daily.     hydrocortisone 2.5 % ointment  Apply 1 Application topically two (2) times a day. To active areas of the face until smooth     hydrOXYzine 25 MG tablet  Commonly known as: ATARAX  Take 1 tablet (25 mg total) by mouth every six (6) hours as needed.     lisinopril 10 MG tablet  Commonly known as: PRINIVIL,ZESTRIL  Take 1 tablet (10 mg total) by mouth daily.     methocarbamol 750 MG tablet  Commonly known as: ROBAXIN  750mg  4x daily prn muscle spasms     metoPROLOL succinate 25 MG 24 hr tablet  Commonly known as: Toprol-XL  Take 1 tablet (25 mg total) by mouth daily.     omeprazole 40 MG capsule  Commonly known as: PriLOSEC  40mg  BID     sertraline 50 MG tablet  Commonly known as: ZOLOFT  Take 1 tablet (50 mg total) by mouth daily.     triamcinolone 0.1 % ointment  Commonly known as: KENALOG  Apply topically two (2) times a day. To active areas of trunk and extremities until smooth            ___________________________________________________________________  Hospital Tests/Labs:    Pending Test Results (if blank, then none):    Hospital Radiology:  ECG 12 Lead  Result Date: 03/24/2024  NORMAL SINUS RHYTHM INCOMPLETE RIGHT BUNDLE BRANCH BLOCK MINIMAL VOLTAGE CRITERIA FOR LVH, MAY BE NORMAL VARIANT ( [employer] product ) BORDERLINE ECG      Most Recent Labs:  Recent Labs     Units 03/23/24  1952 03/24/24  0509   WBC 10*9/L 4.8 4.4   HGB g/dL 16.1 09.6*   HCT % 04.5 34.0*   MCV fL 87.2 82.0   PLT 10*9/L 216 190     Recent Labs     Units 03/23/24  1952 03/23/24  2216 03/24/24  0509   NA mmol/L 130*  --  140   K mmol/L 4.0  --  3.6   CL mmol/L 95*  --  104   CO2 mmol/L 22.0  --  26.0   BUN mg/dL 12  --  9   CREATININE mg/dL 4.09  --  8.11   GLU  mg/dL 161* 096* 045*   CALCIUM mg/dL 9.0  --  8.4*   MG mg/dL 1.7  --  1.6   ALBUMIN g/dL 3.9  --   --    ALT U/L 27  --   --    AST U/L 30  --   --    ALKPHOS U/L 128*  --   --    BILITOT mg/dL 0.4  --   --    PROT g/dL 8.7*  --   --    W0J %  --   --  12.0*     Recent Labs     Units 03/23/24  1953   LACTATE mmol/L 2.4*     No results for input(s): INR, PT, APTT in the last 168 hours.  No results for input(s): TROPONINI, CKMB, PROBNP in the last 168 hours.    Invalid input(s): CK  No results for input(s): O2SOUR, FIO2ART, PHART, PCO2ART, PO2ART, HCO3ART, O2SATART, BEART in the last 168 hours.  Recent Labs     Units 03/23/24  2225   PHVEN  7.32   PCO2VEN mmHg 45.8   PO2VEN mmHg 45.2*   HCO3VEN mmol/L 23.5   O2SATVEN % 76.8   BEVEN mmol/L -2.9*       Recent Labs     03/23/24  2327   WBCUA 0   NITRITE Negative   LEUKOCYTESUR Negative   BACTERIA Occasional*   RBCUA 5*   BLOODU Trace*   GLUCOSEU >1000 mg/dL*   PROTEINUA >811 mg/dl*   KETONESU Negative     No results for input(s): PREGTESTUR in the last 72 hours.  No results for input(s): OPIAU, BENZU, TRICYCLIC, PCPU, AMPHU, COCAU, CANNAU, BARBU, ETOH, ACETAMIN, SALICYLATE in the last 72 hours.  Microbiology Results (last day)       ** No results found for the last 24 hours. ** ___________________________________________________________________  Discharge Instructions        Other Instructions       Discharge instructions      You were seen in the hospital for high blood sugars. We are sending you home on insulin. You should start by taking 12 units every night. You can check your blood sugar before you eat anything in the morning. If your fasting blood sugar is higher than 130 for three mornings in a row, you should increase your insulin dose by 2 units. You can do this until your morning sugars are consistently under 130.     Your morning blood sugar should not be lower than 80. If this happens, you should call your PCP to discuss decreasing your insulin dose.     Please follow-up with your PCP in the next 1 to 2 weeks to discuss the plan for your diabetes and other medications moving forward.          Follow Up instructions and Outpatient Referrals     Discharge instructions        Appointments which have been scheduled for you      Jun 03, 2024 1:00 PM  (Arrive by 12:45 PM)  Return Specialty Id with Venita Sheffield, MD  [facility] INFECTIOUS DISEASES [place] [facility]) [address]  [facility] 1 through 4  [place] [address]  [phone]        Jun 03, 2024 1:55 PM  (Arrive by 1:40 PM)  RETURN CONTINUITY with Julieanne Cotton, PA  [facility] INTERNAL MEDICINE [place]) [address]  [facility] 1 through 4  [place] [address]  [phone]        Jun 11, 2024 2:30 PM  (Arrive by 2:15 PM)  RETURN  COMPLEX with Vivi Ferns, MD  [facility] [place] [facility]) [address]  [place] [address]  [phone]             ___________________________________________________________________

## 2024-03-24 NOTE — Unmapped (Signed)
 [facility] HOSPITALIST SERVICE  ADMISSION H&P      Ryan Davenport is a 63 y.o. male with Hyperglycemia for observation on 03/23/2024.    Assessment/Plan:     Principal Problem:    Hyperglycemia  Active Problems:    Chronic cardiopulmonary disease    Chronic diastolic heart failure    Essential hypertension    Coronary atherosclerosis (RAF-HCC)    Depression    Type 2 diabetes mellitus    AIDS    Obesity (BMI 30.0-34.9)    #Hyperglycemia   #T2DM   Pt found to have BG >900 on presentation, however no anion gap or acidosis on initial labs. Per patient he has not taken any medications in about a month because he was having increased abd pain and wasn't sure which medication might be causing it. He had been prescribed Rybelsus and Jardiance and had A1C 8.2 at PCP visit 2 weeks ago. Pt reports he had been on insulin several years ago. He denies any infectious symptoms and has no signs of infection on exam. Hyperglycemia likely 2/2 no medications for at least a month. Pt blood sugar improved w/ 2 L of fluids and insulin (received 10 units lantus and 5u regular insulin in ED). He is open to re-starting insulin.   - Lantus 12 units nightly with plan to titrate outpatient   - Continue Jardiance, hold Rybelsus in case this was causing patient's GI symptoms   - ACHS POC glucose + SSI   - s/p 2L IVF, encourage PO intake    - Re-start atorvastatin 40mg      #Elevated Cr  Cr initially 1.3 on presentation. Likely pre-renal in setting of hyperglycemia and dehydration. Improved to 0.9 after IVF.   - S/p 2L IVF  - Encourage PO fluids     #R wrist pain   Initially presented for R wrist pain and cramping. Now resolved this AM after fluids. Likely related to dehydration. Encourage ongoing PO hydration.     #HIV   Pt followed by ID, currently on Biktarvy which he reports he is taking sometimes. Encouraged patient to f/up with ID clinic.   - Continue Biktarvy     #HTN   Hypertensive to 160s/110s on presentation. Pt reports taking no medications for ~1 month. Will re-start home regimen.   - Continue lisinopril 10mg      Other chronic conditions:   #Depression- continue home Zoloft 50mg    #GERD- continue home Prilosec   #Gout- Hold home PRN cochicine   #Eczema- Hold home Dupixent       # FEN/GI:  - IVF None  - Check electrolytes as indicated, replete as needed.  - Diet Regular    # PPX:   - DVT: None, anticipate short stay      Code Status: Full Code    Disposition:   Floor    Estimated Date of Discharge (EDD) :   03/24/2024     History of Present Illness:     PCP: Julieanne Cotton, PA  Date of Admission: 03/23/2024  7:54 PM  Patient seen in  Emergency Department by hospital provider.    Chief Complaint:  Chief Complaint   Patient presents with    Hand Pain       HPI   Ryan Davenport is a 63 y.o. male with PMHX of  HIV, T2DM, HTN, CAD who presented with R wrist pain and 3 months increased thirst.     Pt reports he stopped taking all medications about a  month ago because he was having increased abdominal pain and wasn't sure which medication was causing it. He has noticed increased thirst over the last few months. Came into the ED because he was having cramping in his right wrist. Felt like he couldn't control his R grip strength.     No fevers or infectious symptoms at home. Denies chest pain or dyspnea.     ED Course:  Afebrile, tachycardic to 110s and hypertensive to 160s/110s on admission. SpO2 99% on RA. Initial BG 975 but no anion gap or acidosis on labs. Pt received 2L IVF with improvement in glucose to <600. Eventually given 10 units lantus with further improvement then 5 units of regular insulin. Cr improved from 1/3 to 0.9 after fluids. Admitted for further management.     Allergies:  Latex, Shellfish containing products, Metformin, and Penicillins    Medications:   Prior to Admission medications    Medication Sig Start Date End Date Taking? Authorizing Provider   atorvastatin (LIPITOR) 40 MG tablet Take 1 tablet (40 mg total) by mouth daily. 01/02/22 09/30/24  Julieanne Cotton, PA   betamethasone dipropionate 0.05 % cream Apply 1 Application topically two (2) times a day. 08/28/22   [provider]   BIKTARVY 50-200-25 mg tablet Take 1 tablet by mouth daily. 12/11/23   Venita Sheffield, MD   blood sugar diagnostic (GLUCOSE BLOOD) Strp Use to check blood sugar as directed with insulin 3 times a day & for symptoms of high or low blood sugar. 03/24/24   Shelby Mattocks, MD   blood-glucose meter kit Use as instructed 03/24/24 03/24/25  Shelby Mattocks, MD   cetirizine (ZYRTEC) 10 MG tablet Take 1 tablet (10 mg total) by mouth two (2) times a day. 09/25/23 09/24/24  Suan Halter, MD   colchicine (COLCRYS) 0.6 mg tablet TAKE 1 TABLET BY MOUTH DAILY AS NEEDED FOR GOUT 12/14/23   Lorin Picket, Scot Jun, PA   dupilumab (DUPIXENT SYRINGE) 300 mg/2 mL Syrg injection Inject 2 mL (300 mg total) under the skin every fourteen (14) days. For maintenance dose 08/14/23   Turner, Audree Camel, PA   dupilumab (DUPIXENT SYRINGE) 300 mg/2 mL Syrg injection Inject the contents of 2 pens (600 mg) under the skin once for loading dose 08/14/23   Turner, Audree Camel, PA   ELIQUIS 5 mg Tab Take 1 tablet (5 mg total) by mouth two (2) times a day. 04/01/23   [provider]   empagliflozin (JARDIANCE) 25 mg tablet Take 1 tablet (25 mg total) by mouth daily. 06/19/23   Julieanne Cotton, PA   furosemide (LASIX) 20 MG tablet Take 1 tablet (20 mg total) by mouth daily. 02/03/23   [provider]   hydrocortisone 2.5 % ointment Apply 1 Application topically two (2) times a day. To active areas of the face until smooth 09/25/23 09/24/24  Suan Halter, MD   hydrOXYzine (ATARAX) 25 MG tablet Take 1 tablet (25 mg total) by mouth every six (6) hours as needed. 01/21/23   [provider]   insulin glargine (BASAGLAR, LANTUS) 100 unit/mL (3 mL) injection pen Inject 0.12 mL (12 Units total) under the skin nightly. 03/24/24 04/23/24  Shelby Mattocks, MD   lancets Misc Use to check blood sugar as directed with insulin 3 times a day & for symptoms of high or low blood sugar. 03/24/24   Shelby Mattocks, MD   lisinopril (PRINIVIL,ZESTRIL) 10 MG tablet Take 1 tablet (10  mg total) by mouth daily. 03/24/24 04/23/24  Shelby Mattocks, MD   methocarbamoL (ROBAXIN) 750 MG tablet 750mg  4x daily prn muscle spasms 10/27/21   Julieanne Cotton, PA   metoprolol succinate (TOPROL-XL) 25 MG 24 hr tablet Take 1 tablet (25 mg total) by mouth daily. 10/27/21   Julieanne Cotton, PA   omeprazole (PRILOSEC) 40 MG capsule 40mg  BID 03/09/24   Julieanne Cotton, PA   pen needle, diabetic 32 gauge x 5/32 (4 mm) Ndle Use with insulin up to 4 times/day as needed. 03/24/24   Shelby Mattocks, MD   semaglutide (RYBELSUS) 14 mg Tab Take 14 mg by mouth daily. 06/29/23   Julieanne Cotton, PA   sertraline (ZOLOFT) 50 MG tablet Take 1 tablet (50 mg total) by mouth daily. 03/24/24 04/23/24  Shelby Mattocks, MD   triamcinolone (KENALOG) 0.1 % ointment Apply topically two (2) times a day. To active areas of trunk and extremities until smooth 09/25/23 09/24/24  Suan Halter, MD   citalopram (CELEXA) 40 MG tablet Take 1 tablet (40 mg total) by mouth daily. 09/02/17 05/24/21  Julieanne Cotton, PA       Medical History:  Past Medical History:   Diagnosis Date    Asthma     Asymptomatic human immunodeficiency virus (HIV) infection status 04/09/2013    Diagnosed in 2005 with a CD4 of 32 and on at least one prior regimen on a study, then switched to Truvada with ritonavir boosted atazanavir. Changed to Truvada with boosted darunavir secondary to reflux symptoms. Has remained undetectable for several years with CD4 count greater than 500.      CAD (coronary artery disease)     Depression     Diabetes mellitus     DVT (deep venous thrombosis)     Eczema     Enlargement of liver     GERD (gastroesophageal reflux disease)     HIV (human immunodeficiency virus infection)     Hyperlipidemia     Hypertension     Myocardial infarction     Obesity     Pulmonary embolism 2012    Sleep apnea        Surgical History:  Past Surgical History:   Procedure Laterality Date    PR COLONOSCOPY FLX DX W/COLLJ SPEC WHEN PFRMD N/A 11/11/2013    Procedure: COLONOSCOPY, FLEXIBLE, PROXIMAL TO SPLENIC FLEXURE; DIAGNOSTIC, W/WO COLLECTION SPECIMEN BY BRUSH OR WASH;  Surgeon: Romero Belling, MD;  [facility];  Service: Gastroenterology    PR UPPER GI ENDOSCOPY,DIAGNOSIS N/A 04/01/2019    Procedure: UGI ENDO, INCLUDE ESOPHAGUS, STOMACH, & DUODENUM &/OR JEJUNUM; DX W/WO COLLECTION SPECIMN, BY BRUSH OR WASH;  Surgeon: Leland Her, MD;  [facility];  Service: Gastroenterology    SKIN BIOPSY         Social History:  Social History     Socioeconomic History    Marital status: Single     Spouse name: None    Number of children: None    Years of education: None    Highest education level: None   Tobacco Use    Smoking status: Never     Passive exposure: Never    Smokeless tobacco: Never   Vaping Use    Vaping status: Never Used   Substance and Sexual Activity    Alcohol use: No     Alcohol/week: 0.0 standard drinks of alcohol    Drug use: No  Sexual activity: Not Currently     Partners: Male   Other Topics Concern    Do you use sunscreen? No    Tanning bed use? No    Are you easily burned? No    Excessive sun exposure? No    Blistering sunburns? No   Social History Narrative    Single.    No children    Works 2 days per week.  [employer] in [employer]     Social Drivers of Health     Financial Resource Strain: Low Risk  (07/31/2023)    Overall Financial Resource Strain (CARDIA)     Difficulty of Paying Living Expenses: Not hard at all   Food Insecurity: No Food Insecurity (03/09/2024)    Hunger Vital Sign     Worried About Running Out of Food in the Last Year: Never true     Ran Out of Food in the Last Year: Never true   Transportation Needs: No Transportation Needs (03/09/2024)    PRAPARE - Contractor (Medical): No     Lack of Transportation (Non-Medical): No    Received from [facility], [facility]    Social Network   Housing: Low Risk  (03/09/2024)    Housing     Within the past 12 months, have you ever stayed: outside, in a car, in a tent, in an overnight shelter, or temporarily in someone else's home (i.e. couch-surfing)?: No     Are you worried about losing your housing?: No     Tobacco use:   reports that he has never smoked. He has never been exposed to tobacco smoke. He has never used smokeless tobacco.  Alcohol use:   reports no history of alcohol use.  Drug use:  reports no history of drug use.  Lives arrangements: lives alone.  @SDOHALCH @    Family History:  The patient's family history includes Aneurysm in his father; Arthritis in his mother; Diabetes in his mother; Heart disease in his mother; Hypertension in his mother..    Review of Systems:  Constitutional: Negative for fever.  HENT: Negative for sore throat.  Eyes: Negative for visual changes.  Cardiovascular: Negative for chest pain.  Respiratory: Negative for shortness of breath.  Gastrointestinal: Negative for abdominal pain, vomiting or diarrhea.  Genitourinary: Negative for dysuria.  Musculoskeletal: Negative for back pain.  Skin: Negative for rash.  Neurological: Negative for headaches, weakness or numbness.    10 system ROS negative except as marked above and in HPI.    Objective:     ED Triage Vitals   Enc Vitals Group      BP 03/23/24 1949 160/110      Heart Rate 03/23/24 1949 110      SpO2 Pulse 03/24/24 0200 88      Resp 03/23/24 1949 20      Temp 03/23/24 1949 36.4 ??C (97.6 ??F)      Temp Source 03/23/24 1949 Temporal      SpO2 03/23/24 1949 99 %      Weight 03/23/24 1949 (!) 109.8 kg (242 lb)      Height 03/23/24 1949 1.829 m (6')      Head Circumference --       Peak Flow --       Pain Score --       Pain Loc --       Pain Education --       Exclude from Growth Chart --  Temp:  [36.1 ??C (96.9 ??F)-36.4 ??C (97.6 ??F)] 36.1 ??C (96.9 ??F)  Heart Rate:  [85-110] 94  SpO2 Pulse:  [85-88] 87  Resp:  [20] 20  BP: (134-170)/(85-110) 151/96  MAP (mmHg):  [102-119] 119  SpO2:  [94 %-99 %] 96 %  BMI (Calculated):  [32.81] 32.81  I/O this shift:  In: -   Out: 400 [Urine:400]  Weight:   Wt Readings from Last 4 Encounters:   03/23/24 (!) 109.8 kg (242 lb)   03/09/24 (!) 109.8 kg (242 lb)   12/31/23 (!) 111.1 kg (245 lb)   12/11/23 (!) 112 kg (247 lb)       Physical Exam  Constitutional:       General: He is not in acute distress.     Appearance: Normal appearance. He is obese. He is not ill-appearing.   HENT:      Head: Normocephalic.      Mouth/Throat:      Mouth: Mucous membranes are moist.      Pharynx: Oropharynx is clear.   Eyes:      Extraocular Movements: Extraocular movements intact.      Conjunctiva/sclera: Conjunctivae normal.   Cardiovascular:      Rate and Rhythm: Normal rate.   Pulmonary:      Effort: Pulmonary effort is normal. No respiratory distress.   Musculoskeletal:         General: Normal range of motion.   Neurological:      Mental Status: He is alert. Mental status is at baseline.   Psychiatric:         Mood and Affect: Mood normal.         Behavior: Behavior normal.           Test Results  Data Review:   I have reviewed the following labs and studies from the last 24 hours. I have personally interpreted the findings.      Recent Labs     Units 03/23/24  1952 03/24/24  0509   WBC 10*9/L 4.8 4.4   HGB g/dL 16.1 09.6*   HCT % 04.5 34.0*   MCV fL 87.2 82.0   PLT 10*9/L 216 190     Recent Labs     Units 03/23/24  1952 03/23/24  2216 03/24/24  0509   NA mmol/L 130*  --  140   K mmol/L 4.0  --  3.6   CL mmol/L 95*  --  104   CO2 mmol/L 22.0  --  26.0   BUN mg/dL 12  --  9   CREATININE mg/dL 4.09  --  8.11   GLU mg/dL 914* 782* 956*   CALCIUM mg/dL 9.0  --  8.4*   MG mg/dL 1.7  --  1.6   ALBUMIN g/dL 3.9  --   --    ALT U/L 27  --   --    AST U/L 30  --   --    ALKPHOS U/L 128*  --   --    BILITOT mg/dL 0.4  --   --    PROT g/dL 8.7*  --   --    O1H %  --   --  12.0*     Recent Labs     Units 03/23/24  1953   LACTATE mmol/L 2.4*     No results for input(s): INR, PT, APTT in the last 168 hours.  No results for input(s): TROPONINI, CKMB, PROBNP in the last 168 hours.  Invalid input(s): CK  No results for input(s): O2SOUR, FIO2ART, PHART, PCO2ART, PO2ART, HCO3ART, O2SATART, BEART in the last 168 hours.  Recent Labs     Units 03/23/24  2225   PHVEN  7.32   PCO2VEN mmHg 45.8   PO2VEN mmHg 45.2*   HCO3VEN mmol/L 23.5   O2SATVEN % 76.8   BEVEN mmol/L -2.9*       Recent Labs     03/23/24  2327   WBCUA 0   NITRITE Negative   LEUKOCYTESUR Negative   BACTERIA Occasional*   RBCUA 5*   BLOODU Trace*   GLUCOSEU >1000 mg/dL*   PROTEINUA >098 mg/dl*   KETONESU Negative     No results for input(s): PREGTESTUR in the last 72 hours.  No results for input(s): OPIAU, BENZU, TRICYCLIC, PCPU, AMPHU, COCAU, CANNAU, BARBU, ETOH, ACETAMIN, SALICYLATE in the last 72 hours.  Microbiology Results (last day)       ** No results found for the last 24 hours. **            Pending Labs:      Imaging: Radiology studies were personally reviewed  ECG 12 Lead  Result Date: 03/24/2024  NORMAL SINUS RHYTHM INCOMPLETE RIGHT BUNDLE BRANCH BLOCK MINIMAL VOLTAGE CRITERIA FOR LVH, MAY BE NORMAL VARIANT ( Cornell product ) BORDERLINE ECG

## 2024-03-24 NOTE — Unmapped (Signed)
 Ryan Davenport is a 63 y.o. male with PMHX of  HIV, T2DM, HTN, CAD who presented with R wrist pain and 3 months increased thirst.     #Hyperglycemia   #T2DM   Pt found to have BG >900 on presentation, however no anion gap or acidosis on initial labs. Per patient he has not taken any medications in about a month because he was having increased abd pain and wasn't sure which medication might be causing it. He had been prescribed Rybelsus and Jardiance and had A1C 8.2 at PCP visit 2 weeks ago. A1C on presentation now 12. He denies any infectious symptoms and has no signs of infection on evaluation. Hyperglycemia likely 2/2 no medications for at least a month. Pt blood sugar improved w/ 2 L of fluids and insulin (received 10 units lantus and 5u regular insulin in ED). He is open to re-starting insulin (had been on insulin several years ago. Made plan to re-start at 12u lantus nightly and gave patient instructions on how to titrate insulin at home. Holding Rybelsus for now in case this was contributing to patient's abdomina symptoms that caused him to stop all meds. Defer to PCP to re-start.     #Elevated Cr  Cr initially 1.3 on presentation. Likely pre-renal in setting of hyperglycemia and dehydration. Improved to 0.9 after IVF.      #R wrist pain   Initially presented for R wrist pain and cramping. Now resolved this AM after fluids. Likely related to dehydration. Encouraged ongoing PO hydration.      #HTN   Hypertensive to 160s/110s on presentation. Pt reports taking no medications for ~1 month. Will re-start home lisinopril 10mg  and defer to PCP to adjust further.

## 2024-03-24 NOTE — Unmapped (Signed)
 Case Management Brief Assessment      General: patient discharged from the ED this am before being seen by CM. A brief chart review was completed and CM spoke with treatment team. No needs were identified.       Extended Emergency Contact Information  Primary Emergency Contact: Ryan Davenport  Address: unknown   [place]  Home Phone: [phone]  Relation: Mother      Financial Information:            Discharge Needs:              Discharge Plan:       Estimated Discharge Date: 03/24/2024             Additional Information:    HCDM (patient stated preference): Logyn, Kendrick - Mother - [phone]    Social Drivers of Health     Food Insecurity: No Food Insecurity (03/09/2024)    Hunger Vital Sign     Worried About Running Out of Food in the Last Year: Never true     Ran Out of Food in the Last Year: Never true   Tobacco Use: Low Risk  (03/23/2024)    Patient History     Smoking Tobacco Use: Never     Smokeless Tobacco Use: Never     Passive Exposure: Never   Transportation Needs: No Transportation Needs (03/09/2024)    PRAPARE - Transportation     Lack of Transportation (Medical): No     Lack of Transportation (Non-Medical): No   Alcohol Use: Not At Risk (07/31/2023)    Alcohol Use     How often do you have a drink containing alcohol?: Never     How many drinks containing alcohol do you have on a typical day when you are drinking?: 1 - 2     How often do you have 5 or more drinks on one occasion?: Never   Housing: Low Risk  (03/09/2024)    Housing     Within the past 12 months, have you ever stayed: outside, in a car, in a tent, in an overnight shelter, or temporarily in someone else's home (i.e. couch-surfing)?: No     Are you worried about losing your housing?: No   Physical Activity: Not on file   Utilities: Low Risk  (03/09/2024)    Utilities     Within the past 12 months, have you been unable to get utilities (heat, electricity) when it was really needed?: No   Stress: Not on file   Interpersonal Safety: Not At Risk (03/09/2024)    Interpersonal Safety     Unsafe Where You Currently Live: No     Physically Hurt by Anyone: No     Abused by Anyone: No   Substance Use: Low Risk  (07/31/2023)    Substance Use     In the past year, how often have you used prescription drugs for non-medical reasons?: Never     In the past year, how often have you used illegal drugs?: Never     In the past year, have you used any substance for non-medical reasons?: No   Intimate Partner Violence: Unknown (11/21/2022)    Received from [facility], [facility]    HITS     Physically Hurt: Not on file     Insult or Talk Down To: Not on file     Threaten Physical Harm: Not on file     Scream or Curse: Not on file  Social Connections: Unknown (11/21/2022)    Received from [facility], [facility]    Social Network     Social Network: Not on file   Financial Resource Strain: Low Risk  (07/31/2023)    Overall Financial Resource Strain (CARDIA)     Difficulty of Paying Living Expenses: Not hard at all   Depression: Not at risk (02/01/2023)    PHQ-2     PHQ-2 Score: 2   Internet Connectivity: Not on file   Health Literacy: Not on file       Predictive Model Details   No score data available for [facility] Risk of Unplanned Readmission

## 2024-03-25 DIAGNOSIS — Z09 Encounter for follow-up examination after completed treatment for conditions other than malignant neoplasm: Principal | ICD-10-CM

## 2024-03-26 ENCOUNTER — Emergency Department (HOSPITAL_COMMUNITY)

## 2024-03-26 ENCOUNTER — Encounter (HOSPITAL_COMMUNITY): Payer: Self-pay | Admitting: Emergency Medicine

## 2024-03-26 ENCOUNTER — Other Ambulatory Visit: Payer: Self-pay

## 2024-03-26 ENCOUNTER — Observation Stay (HOSPITAL_COMMUNITY)
Admission: EM | Admit: 2024-03-26 | Discharge: 2024-03-27 | Disposition: A | Discharge status: Home / Self Care | Attending: Emergency Medicine | Admitting: Emergency Medicine

## 2024-03-26 DIAGNOSIS — F341 Dysthymic disorder: Secondary | ICD-10-CM | POA: Diagnosis not present

## 2024-03-26 DIAGNOSIS — Z86711 Personal history of pulmonary embolism: Secondary | ICD-10-CM | POA: Diagnosis not present

## 2024-03-26 DIAGNOSIS — Z86718 Personal history of other venous thrombosis and embolism: Secondary | ICD-10-CM | POA: Insufficient documentation

## 2024-03-26 DIAGNOSIS — Z91040 Latex allergy status: Secondary | ICD-10-CM | POA: Insufficient documentation

## 2024-03-26 DIAGNOSIS — E785 Hyperlipidemia, unspecified: Secondary | ICD-10-CM | POA: Diagnosis not present

## 2024-03-26 DIAGNOSIS — I110 Hypertensive heart disease with heart failure: Secondary | ICD-10-CM | POA: Insufficient documentation

## 2024-03-26 DIAGNOSIS — Z7901 Long term (current) use of anticoagulants: Secondary | ICD-10-CM | POA: Diagnosis not present

## 2024-03-26 DIAGNOSIS — E871 Hypo-osmolality and hyponatremia: Secondary | ICD-10-CM | POA: Diagnosis not present

## 2024-03-26 DIAGNOSIS — B20 Human immunodeficiency virus [HIV] disease: Secondary | ICD-10-CM | POA: Insufficient documentation

## 2024-03-26 DIAGNOSIS — N179 Acute kidney failure, unspecified: Secondary | ICD-10-CM | POA: Diagnosis not present

## 2024-03-26 DIAGNOSIS — Z6830 Body mass index (BMI) 30.0-30.9, adult: Secondary | ICD-10-CM | POA: Insufficient documentation

## 2024-03-26 DIAGNOSIS — Z7984 Long term (current) use of oral hypoglycemic drugs: Secondary | ICD-10-CM | POA: Diagnosis not present

## 2024-03-26 DIAGNOSIS — E669 Obesity, unspecified: Secondary | ICD-10-CM | POA: Insufficient documentation

## 2024-03-26 DIAGNOSIS — E1165 Type 2 diabetes mellitus with hyperglycemia: Secondary | ICD-10-CM | POA: Diagnosis present

## 2024-03-26 DIAGNOSIS — E872 Acidosis, unspecified: Secondary | ICD-10-CM | POA: Diagnosis not present

## 2024-03-26 DIAGNOSIS — R739 Hyperglycemia, unspecified: Principal | ICD-10-CM | POA: Diagnosis present

## 2024-03-26 DIAGNOSIS — Z79899 Other long term (current) drug therapy: Secondary | ICD-10-CM | POA: Diagnosis not present

## 2024-03-26 DIAGNOSIS — I5032 Chronic diastolic (congestive) heart failure: Secondary | ICD-10-CM | POA: Diagnosis not present

## 2024-03-26 LAB — CBC
HCT: 39.3 % (ref 39.0–52.0)
Hemoglobin: 12.5 g/dL — ABNORMAL LOW (ref 13.0–17.0)
MCH: 26.7 pg (ref 26.0–34.0)
MCHC: 31.8 g/dL (ref 30.0–36.0)
MCV: 83.8 fL (ref 80.0–100.0)
Platelets: 231 10*3/uL (ref 150–400)
RBC: 4.69 MIL/uL (ref 4.22–5.81)
RDW: 15.1 % (ref 11.5–15.5)
WBC: 5.5 10*3/uL (ref 4.0–10.5)
nRBC: 0 % (ref 0.0–0.2)

## 2024-03-26 LAB — URINALYSIS, MICROSCOPIC (REFLEX): Bacteria, UA: NONE SEEN

## 2024-03-26 LAB — URINALYSIS, ROUTINE W REFLEX MICROSCOPIC
Bilirubin Urine: NEGATIVE
Glucose, UA: >=500 mg/dL — AB
Ketones, ur: NEGATIVE mg/dL
Leukocytes,Ua: NEGATIVE
Nitrite: NEGATIVE
Protein, ur: 100 mg/dL — AB
Specific Gravity, Urine: 1.01 (ref 1.005–1.030)
pH: 6 (ref 5.0–8.0)

## 2024-03-26 LAB — I-STAT CHEM 8, ED
BUN: 12 mg/dL (ref 8–23)
Calcium, Ion: 1.04 mmol/L — ABNORMAL LOW (ref 1.15–1.40)
Chloride: 98 mmol/L (ref 98–111)
Creatinine, Ser: 1.8 mg/dL — ABNORMAL HIGH (ref 0.61–1.24)
Glucose, Bld: 612 mg/dL (ref 70–99)
HCT: 43 % (ref 39.0–52.0)
Hemoglobin: 14.6 g/dL (ref 13.0–17.0)
Potassium: 3.5 mmol/L (ref 3.5–5.1)
Sodium: 133 mmol/L — ABNORMAL LOW (ref 135–145)
TCO2: 23 mmol/L (ref 22–32)

## 2024-03-26 LAB — CBG MONITORING, ED: Glucose-Capillary: 546 mg/dL (ref 70–99)

## 2024-03-26 LAB — BASIC METABOLIC PANEL WITH GFR
Anion gap: 9 (ref 5–15)
BUN: 11 mg/dL (ref 8–23)
CO2: 21 mmol/L — ABNORMAL LOW (ref 22–32)
Calcium: 8.6 mg/dL — ABNORMAL LOW (ref 8.9–10.3)
Chloride: 98 mmol/L (ref 98–111)
Creatinine, Ser: 1.67 mg/dL — ABNORMAL HIGH (ref 0.61–1.24)
GFR, Estimated: 46 mL/min — ABNORMAL LOW (ref >60)
Glucose, Bld: 605 mg/dL (ref 70–99)
Potassium: 3.5 mmol/L (ref 3.5–5.1)
Sodium: 128 mmol/L — ABNORMAL LOW (ref 135–145)

## 2024-03-26 MED ORDER — SODIUM CHLORIDE 0.9 % IV BOLUS
1000.0000 mL | Freq: Once | INTRAVENOUS | Status: AC
  Administered 2024-03-26: 1000 mL via INTRAVENOUS

## 2024-03-26 MED ORDER — INSULIN ASPART 100 UNIT/ML IJ SOLN
10.0000 [IU] | Freq: Once | INTRAMUSCULAR | Status: AC
  Administered 2024-03-26: 10 [IU] via INTRAVENOUS

## 2024-03-26 NOTE — ED Triage Notes (Signed)
 Pt from home reports he is here for high sugar.  Pt was in the hospital on Tuesday b/c "my sugar was over 1000.  They have been on my medications but they are still wrong."  Pt also indicated that he "think I had a stroke while I was in the hospital,my right hand cramps when I drive."

## 2024-03-26 NOTE — ED Provider Notes (Signed)
 [facility] DEPT [facility] Emergency Department Provider Note MRN:  [id number]  Arrival date & time: 03/27/24     Chief Complaint   Hyperglycemia   History of Present Illness   Craig [name] is a 63 y.o. year-old male presents to the ED with chief complaint of hyperglycemia.  He states that he was seen about 3 days ago at [facility] for hyperglycemia.  States that his blood sugars were running in the 1000s.  He states that his blood sugar continues to run very high.  He also reports right hand weakness, spasms, and cramping that started 2 days ago.  He denies any other weakness, numbness, or tingling.  He states that he has some generalized abdominal cramps and diarrhea.  Denies any other associated symptoms.  History provided by patient.   Review of Systems  Pertinent positive and negative review of systems noted in HPI.    Physical Exam   Vitals:   03/26/24 2107 03/27/24 0000  BP: 134/85 (!) 145/81  Pulse: (!) 119 (!) 101  Resp: 18 (!) 29  Temp: 99.3 F (37.4 C)   SpO2: 96% 100%    CONSTITUTIONAL:  non toxic-appearing, NAD NEURO:  Alert and oriented x 3, CN 3-12 grossly intact EYES:  eyes equal and reactive ENT/NECK:  Supple, no stridor  CARDIO:  tachycardic, regular rhythm, appears well-perfused  PULM:  No respiratory distress, CTAB GI/GU:  non-distended, non focal abdominal tenderness MSK/SPINE:  No gross deformities, no edema, moves all extremities  SKIN:  no rash, atraumatic   *Additional and/or pertinent findings included in MDM below  Diagnostic and Interventional Summary    EKG Interpretation Date/Time:  Thursday March 26 2024 23:01:53 EDT Ventricular Rate:  101 PR Interval:  201 QRS Duration:  103 QT Interval:  357 QTC Calculation: 463 R Axis:   -28  Text Interpretation: Sinus tachycardia LAE, consider biatrial enlargement Incomplete RBBB and LAFB RSR' in V1 or V2, right VCD or RVH Left ventricular hypertrophy ST elevation, consider anterior injury  Confirmed by Kristine Royal [phone]) on 03/26/2024 11:10:42 PM       Labs Reviewed  CBC - Abnormal; Notable for the following components:      Result Value   Hemoglobin 12.5 (*)    All other components within normal limits  URINALYSIS, ROUTINE W REFLEX MICROSCOPIC - Abnormal; Notable for the following components:   Glucose, UA >=500 (*)    Hgb urine dipstick TRACE (*)    Protein, ur 100 (*)    All other components within normal limits  BASIC METABOLIC PANEL WITH GFR - Abnormal; Notable for the following components:   Sodium 128 (*)    CO2 21 (*)    Glucose, Bld 605 (*)    Creatinine, Ser 1.67 (*)    Calcium 8.6 (*)    GFR, Estimated 46 (*)    All other components within normal limits  COMPREHENSIVE METABOLIC PANEL WITH GFR - Abnormal; Notable for the following components:   Sodium 127 (*)    Chloride 95 (*)    CO2 21 (*)    Glucose, Bld 652 (*)    Creatinine, Ser 1.69 (*)    Calcium 8.6 (*)    Albumin 2.9 (*)    GFR, Estimated 46 (*)    All other components within normal limits  CBG MONITORING, ED - Abnormal; Notable for the following components:   Glucose-Capillary 546 (*)    All other components within normal limits  CBG MONITORING, ED - Abnormal; Notable for the  following components:   Glucose-Capillary 477 (*)    All other components within normal limits  I-STAT CHEM 8, ED - Abnormal; Notable for the following components:   Sodium 133 (*)    Creatinine, Ser 1.80 (*)    Glucose, Bld 612 (*)    Calcium, Ion 1.04 (*)    All other components within normal limits  CBG MONITORING, ED - Abnormal; Notable for the following components:   Glucose-Capillary 389 (*)    All other components within normal limits  ETHANOL  PROTIME-INR  APTT  URINALYSIS, MICROSCOPIC (REFLEX)  RAPID URINE DRUG SCREEN, HOSP PERFORMED  CBC  BASIC METABOLIC PANEL WITH GFR  MAGNESIUM  PHOSPHORUS  HEMOGLOBIN A1C  DIFFERENTIAL  I-STAT VENOUS BLOOD GAS, ED  CBG MONITORING, ED    CT HEAD WO  CONTRAST  Final Result      Medications  lactated ringers infusion (has no administration in time range)  bictegravir-emtricitabine-tenofovir AF (BIKTARVY) 50-200-25 MG per tablet 1 tablet (has no administration in time range)  rivaroxaban (XARELTO) tablet 20 mg (has no administration in time range)  insulin glargine-yfgn (SEMGLEE) injection 10 Units (has no administration in time range)  insulin aspart (novoLOG) injection 0-20 Units (has no administration in time range)  insulin aspart (novoLOG) injection 0-5 Units (has no administration in time range)  acetaminophen (TYLENOL) tablet 650 mg (has no administration in time range)  prochlorperazine (COMPAZINE) injection 5 mg (has no administration in time range)  melatonin tablet 5 mg (has no administration in time range)  polyethylene glycol (MIRALAX / GLYCOLAX) packet 17 g (has no administration in time range)  magnesium sulfate IVPB 1 g 100 mL (has no administration in time range)  sodium chloride 0.9 % bolus 1,000 mL (1,000 mLs Intravenous New Bag/Given 03/26/24 2316)  insulin aspart (novoLOG) injection 10 Units (10 Units Intravenous Given 03/26/24 2323)     Procedures  /  Critical Care .Critical Care  Performed by: Roxy Horseman, PA-C Authorized by: Roxy Horseman, PA-C   Critical care provider statement:    Critical care time (minutes):  44   Critical care was necessary to treat or prevent imminent or life-threatening deterioration of the following conditions:  Metabolic crisis   Critical care was time spent personally by me on the following activities:  Development of treatment plan with patient or surrogate, discussions with consultants, evaluation of patient's response to treatment, examination of patient, ordering and review of laboratory studies, ordering and review of radiographic studies, ordering and performing treatments and interventions, pulse oximetry, re-evaluation of patient's condition and review of old charts   ED  Course and Medical Decision Making  I have reviewed the triage vital signs, the nursing notes, and pertinent available records from the EMR.  Social Determinants Affecting Complexity of Care: Patient has no clinically significant social determinants affecting this chief complaint..   ED Course:    Medical Decision Making Patient here for hyperglycemia.  Was seen 3 days ago in the [facility] health system for hyperglycemia and had blood sugars over thousand.  He returns to our ED tonight with blood sugars in the 600s.  He states that he has had some associated diarrhea.  He denies any significant changes in his medications.  Patient noted to have glucose in the 600s.  Will give fluids and insulin.  He also has some right hand spasming, thought secondary to dehydration, but will check CT head given persistent nature.  Given that patient has had 2 visits in the past few days for  profound hyperglycemia, I think that he would benefit from hospital admission and better counseling/management of his glucose.  Amount and/or Complexity of Data Reviewed Labs: ordered. Radiology: ordered.  Risk Prescription drug management. Decision regarding hospitalization.         Consultants: I consulted with Hospitalist, Dr. Margo Aye, who is appreciated for admitting.   Treatment and Plan: Patient's exam and diagnostic results are concerning for hyperglycemia.  Feel that patient will need admission to the hospital for further treatment and evaluation.    Final Clinical Impressions(s) / ED Diagnoses     ICD-10-CM   1. Hyperglycemia  R73.9       ED Discharge Orders     None         Discharge Instructions Discussed with and Provided to Patient:   Discharge Instructions   None      Roxy Horseman, PA-C 03/27/24 0125    Shon Baton, MD 03/27/24 [phone]

## 2024-03-27 ENCOUNTER — Observation Stay (HOSPITAL_COMMUNITY)

## 2024-03-27 DIAGNOSIS — R739 Hyperglycemia, unspecified: Secondary | ICD-10-CM | POA: Diagnosis not present

## 2024-03-27 LAB — CBC
HCT: 36.9 % — ABNORMAL LOW (ref 39.0–52.0)
Hemoglobin: 11.9 g/dL — ABNORMAL LOW (ref 13.0–17.0)
MCH: 26.8 pg (ref 26.0–34.0)
MCHC: 32.2 g/dL (ref 30.0–36.0)
MCV: 83.1 fL (ref 80.0–100.0)
Platelets: 203 10*3/uL (ref 150–400)
RBC: 4.44 MIL/uL (ref 4.22–5.81)
RDW: 15.3 % (ref 11.5–15.5)
WBC: 4.9 10*3/uL (ref 4.0–10.5)
nRBC: 0 % (ref 0.0–0.2)

## 2024-03-27 LAB — DIFFERENTIAL
Abs Immature Granulocytes: 0.01 10*3/uL (ref 0.00–0.07)
Basophils Absolute: 0 10*3/uL (ref 0.0–0.1)
Basophils Relative: 0 %
Eosinophils Absolute: 0.1 10*3/uL (ref 0.0–0.5)
Eosinophils Relative: 3 %
Immature Granulocytes: 0 %
Lymphocytes Relative: 39 %
Lymphs Abs: 1.9 10*3/uL (ref 0.7–4.0)
Monocytes Absolute: 0.4 10*3/uL (ref 0.1–1.0)
Monocytes Relative: 8 %
Neutro Abs: 2.4 10*3/uL (ref 1.7–7.7)
Neutrophils Relative %: 50 %

## 2024-03-27 LAB — COMPREHENSIVE METABOLIC PANEL WITH GFR
ALT: 20 U/L (ref 0–44)
AST: 24 U/L (ref 15–41)
Albumin: 2.9 g/dL — ABNORMAL LOW (ref 3.5–5.0)
Alkaline Phosphatase: 81 U/L (ref 38–126)
Anion gap: 11 (ref 5–15)
BUN: 12 mg/dL (ref 8–23)
CO2: 21 mmol/L — ABNORMAL LOW (ref 22–32)
Calcium: 8.6 mg/dL — ABNORMAL LOW (ref 8.9–10.3)
Chloride: 95 mmol/L — ABNORMAL LOW (ref 98–111)
Creatinine, Ser: 1.69 mg/dL — ABNORMAL HIGH (ref 0.61–1.24)
GFR, Estimated: 46 mL/min — ABNORMAL LOW (ref >60)
Glucose, Bld: 652 mg/dL (ref 70–99)
Potassium: 3.5 mmol/L (ref 3.5–5.1)
Sodium: 127 mmol/L — ABNORMAL LOW (ref 135–145)
Total Bilirubin: 0.5 mg/dL (ref 0.0–1.2)
Total Protein: 8 g/dL (ref 6.5–8.1)

## 2024-03-27 LAB — CBG MONITORING, ED
Glucose-Capillary: 244 mg/dL — ABNORMAL HIGH (ref 70–99)
Glucose-Capillary: 330 mg/dL — ABNORMAL HIGH (ref 70–99)
Glucose-Capillary: 337 mg/dL — ABNORMAL HIGH (ref 70–99)
Glucose-Capillary: 340 mg/dL — ABNORMAL HIGH (ref 70–99)
Glucose-Capillary: 389 mg/dL — ABNORMAL HIGH (ref 70–99)
Glucose-Capillary: 477 mg/dL — ABNORMAL HIGH (ref 70–99)

## 2024-03-27 LAB — MAGNESIUM: Magnesium: 2.2 mg/dL (ref 1.7–2.4)

## 2024-03-27 LAB — HEMOGLOBIN A1C
Hgb A1c MFr Bld: 12.7 % — ABNORMAL HIGH (ref 4.8–5.6)
Mean Plasma Glucose: 317.79 mg/dL

## 2024-03-27 LAB — BASIC METABOLIC PANEL WITH GFR
Anion gap: 11 (ref 5–15)
BUN: 11 mg/dL (ref 8–23)
CO2: 22 mmol/L (ref 22–32)
Calcium: 8.5 mg/dL — ABNORMAL LOW (ref 8.9–10.3)
Chloride: 101 mmol/L (ref 98–111)
Creatinine, Ser: 1.4 mg/dL — ABNORMAL HIGH (ref 0.61–1.24)
GFR, Estimated: 57 mL/min — ABNORMAL LOW (ref >60)
Glucose, Bld: 333 mg/dL — ABNORMAL HIGH (ref 70–99)
Potassium: 3.1 mmol/L — ABNORMAL LOW (ref 3.5–5.1)
Sodium: 134 mmol/L — ABNORMAL LOW (ref 135–145)

## 2024-03-27 LAB — APTT: aPTT: 27 s (ref 24–36)

## 2024-03-27 LAB — PROTIME-INR
INR: 1.2 (ref 0.8–1.2)
Prothrombin Time: 15 s (ref 11.4–15.2)

## 2024-03-27 LAB — PHOSPHORUS: Phosphorus: 3.9 mg/dL (ref 2.5–4.6)

## 2024-03-27 LAB — ETHANOL: Alcohol, Ethyl (B): <10 mg/dL (ref <10)

## 2024-03-27 LAB — GLUCOSE, CAPILLARY: Glucose-Capillary: 117 mg/dL — ABNORMAL HIGH (ref 70–99)

## 2024-03-27 MED ORDER — CYCLOBENZAPRINE HCL 5 MG PO TABS
5.0000 mg | ORAL_TABLET | Freq: Three times a day (TID) | ORAL | 0 refills | Status: DC | PRN
  Filled 2024-03-27: qty 20, 7d supply, fill #0

## 2024-03-27 MED ORDER — CYCLOBENZAPRINE HCL 5 MG PO TABS: 5.0000 mg | ORAL_TABLET | Freq: Three times a day (TID) | ORAL | 0 refills | Status: AC | PRN

## 2024-03-27 MED ORDER — INSULIN GLARGINE-YFGN 100 UNIT/ML ~~LOC~~ SOLN
10.0000 [IU] | Freq: Two times a day (BID) | SUBCUTANEOUS | Status: DC
Start: 2024-03-27 — End: 2024-03-27
  Administered 2024-03-27 (×2): 10 [IU] via SUBCUTANEOUS
  Filled 2024-03-27 (×4): qty 0.1

## 2024-03-27 MED ORDER — ACETAMINOPHEN 325 MG PO TABS
650.0000 mg | ORAL_TABLET | Freq: Four times a day (QID) | ORAL | Status: DC | PRN
  Administered 2024-03-27: 650 mg via ORAL
  Filled 2024-03-27: qty 2

## 2024-03-27 MED ORDER — LANCET DEVICE MISC
1.0000 | Freq: Three times a day (TID) | 0 refills | Status: DC
  Filled 2024-03-27: qty 1, fill #0

## 2024-03-27 MED ORDER — INSULIN GLARGINE 100 UNIT/ML SOLOSTAR PEN
10.0000 [IU] | PEN_INJECTOR | Freq: Two times a day (BID) | SUBCUTANEOUS | 0 refills | Status: DC
  Filled 2024-03-27: qty 15, 75d supply, fill #0

## 2024-03-27 MED ORDER — METOPROLOL SUCCINATE ER 25 MG PO TB24
12.5000 mg | ORAL_TABLET | Freq: Every day | ORAL | Status: DC
  Administered 2024-03-27: 12.5 mg via ORAL
  Filled 2024-03-27: qty 1

## 2024-03-27 MED ORDER — POLYETHYLENE GLYCOL 3350 17 G PO PACK: 17.0000 g | PACK | Freq: Every day | ORAL | Status: DC | PRN

## 2024-03-27 MED ORDER — BLOOD GLUCOSE MONITORING SUPPL DEVI
1.0000 | Freq: Three times a day (TID) | 0 refills | Status: AC
Start: 2024-03-27 — End: ?

## 2024-03-27 MED ORDER — INSULIN GLARGINE 100 UNIT/ML SOLOSTAR PEN: 10.0000 [IU] | PEN_INJECTOR | Freq: Two times a day (BID) | SUBCUTANEOUS | 0 refills | Status: AC

## 2024-03-27 MED ORDER — BLOOD GLUCOSE TEST VI STRP: 1.0000 | ORAL_STRIP | Freq: Three times a day (TID) | 0 refills | Status: AC

## 2024-03-27 MED ORDER — SERTRALINE HCL 25 MG PO TABS
50.0000 mg | ORAL_TABLET | Freq: Every day | ORAL | Status: DC
  Administered 2024-03-27: 50 mg via ORAL
  Filled 2024-03-27: qty 1

## 2024-03-27 MED ORDER — ATORVASTATIN CALCIUM 40 MG PO TABS
40.0000 mg | ORAL_TABLET | Freq: Every day | ORAL | Status: DC
  Administered 2024-03-27: 40 mg via ORAL
  Filled 2024-03-27: qty 1

## 2024-03-27 MED ORDER — LANCETS MISC: 1.0000 | Freq: Three times a day (TID) | 0 refills | Status: AC

## 2024-03-27 MED ORDER — INSULIN ASPART 100 UNIT/ML IJ SOLN
5.0000 [IU] | Freq: Three times a day (TID) | INTRAMUSCULAR | Status: DC
  Administered 2024-03-27 (×2): 5 [IU] via SUBCUTANEOUS

## 2024-03-27 MED ORDER — INSULIN ASPART 100 UNIT/ML IJ SOLN
0.0000 [IU] | Freq: Three times a day (TID) | INTRAMUSCULAR | Status: DC
  Administered 2024-03-27: 15 [IU] via SUBCUTANEOUS
  Administered 2024-03-27: 7 [IU] via SUBCUTANEOUS

## 2024-03-27 MED ORDER — BLOOD GLUCOSE TEST VI STRP
1.0000 | ORAL_STRIP | Freq: Three times a day (TID) | 0 refills | Status: DC
  Filled 2024-03-27: qty 100, 34d supply, fill #0

## 2024-03-27 MED ORDER — PEN NEEDLES 31G X 5 MM MISC
1.0000 | Freq: Three times a day (TID) | 0 refills | Status: AC
Start: 2024-03-27 — End: ?

## 2024-03-27 MED ORDER — APIXABAN 5 MG PO TABS
5.0000 mg | ORAL_TABLET | Freq: Two times a day (BID) | ORAL | Status: DC
  Administered 2024-03-27 (×2): 5 mg via ORAL
  Filled 2024-03-27 (×2): qty 1

## 2024-03-27 MED ORDER — METOPROLOL SUCCINATE ER 25 MG PO TB24: 12.5000 mg | ORAL_TABLET | Freq: Every day | ORAL | 0 refills | Status: AC

## 2024-03-27 MED ORDER — INSULIN ASPART 100 UNIT/ML FLEXPEN
5.0000 [IU] | PEN_INJECTOR | Freq: Three times a day (TID) | SUBCUTANEOUS | 0 refills | Status: DC
  Filled 2024-03-27: qty 15, fill #0

## 2024-03-27 MED ORDER — INSULIN ASPART 100 UNIT/ML IJ SOLN
0.0000 [IU] | Freq: Every day | INTRAMUSCULAR | Status: DC
  Administered 2024-03-27: 4 [IU] via SUBCUTANEOUS

## 2024-03-27 MED ORDER — POTASSIUM CHLORIDE CRYS ER 20 MEQ PO TBCR
40.0000 meq | EXTENDED_RELEASE_TABLET | ORAL | Status: AC
  Administered 2024-03-27 (×2): 40 meq via ORAL
  Filled 2024-03-27 (×2): qty 2

## 2024-03-27 MED ORDER — LANCET DEVICE MISC
1.0000 | Freq: Three times a day (TID) | 0 refills | Status: AC
Start: 2024-03-27 — End: ?

## 2024-03-27 MED ORDER — PEN NEEDLES 31G X 5 MM MISC
1.0000 | Freq: Three times a day (TID) | 0 refills | Status: DC
  Filled 2024-03-27: qty 100, fill #0

## 2024-03-27 MED ORDER — CYCLOBENZAPRINE HCL 5 MG PO TABS
5.0000 mg | ORAL_TABLET | Freq: Three times a day (TID) | ORAL | Status: DC | PRN
Start: 2024-03-27 — End: 2024-03-27
  Administered 2024-03-27: 5 mg via ORAL
  Filled 2024-03-27: qty 1

## 2024-03-27 MED ORDER — BICTEGRAVIR-EMTRICITAB-TENOFOV 50-200-25 MG PO TABS
1.0000 | ORAL_TABLET | Freq: Every day | ORAL | Status: DC
  Administered 2024-03-27: 1 via ORAL
  Filled 2024-03-27: qty 1

## 2024-03-27 MED ORDER — LIVING WELL WITH DIABETES BOOK
Freq: Once | Status: AC
  Filled 2024-03-27: qty 1

## 2024-03-27 MED ORDER — LANCETS MISC
1.0000 | Freq: Three times a day (TID) | 0 refills | Status: DC
  Filled 2024-03-27: qty 100, fill #0

## 2024-03-27 MED ORDER — INSULIN STARTER KIT- PEN NEEDLES (ENGLISH)
1.0000 | Freq: Once | Status: AC
  Administered 2024-03-27: 1
  Filled 2024-03-27: qty 1

## 2024-03-27 MED ORDER — LACTATED RINGERS IV SOLN: INTRAVENOUS | Status: DC

## 2024-03-27 MED ORDER — MAGNESIUM SULFATE IN D5W 1-5 GM/100ML-% IV SOLN
1.0000 g | Freq: Once | INTRAVENOUS | Status: AC
  Administered 2024-03-27: 1 g via INTRAVENOUS
  Filled 2024-03-27: qty 100

## 2024-03-27 MED ORDER — MELATONIN 5 MG PO TABS: 5.0000 mg | ORAL_TABLET | Freq: Every evening | ORAL | Status: DC | PRN

## 2024-03-27 MED ORDER — PROCHLORPERAZINE EDISYLATE 10 MG/2ML IJ SOLN: 5.0000 mg | Freq: Four times a day (QID) | INTRAMUSCULAR | Status: DC | PRN

## 2024-03-27 MED ORDER — METOPROLOL SUCCINATE ER 25 MG PO TB24
12.5000 mg | ORAL_TABLET | Freq: Every day | ORAL | 0 refills | Status: DC
  Filled 2024-03-27: qty 30, 60d supply, fill #0

## 2024-03-27 MED ORDER — BLOOD GLUCOSE MONITORING SUPPL DEVI
1.0000 | Freq: Three times a day (TID) | 0 refills | Status: DC
Start: 2024-03-27 — End: 2024-03-27
  Filled 2024-03-27: qty 1, fill #0

## 2024-03-27 MED ORDER — INSULIN ASPART 100 UNIT/ML FLEXPEN: 5.0000 [IU] | PEN_INJECTOR | Freq: Three times a day (TID) | SUBCUTANEOUS | 0 refills | Status: AC

## 2024-03-27 MED ORDER — RIVAROXABAN 10 MG PO TABS: 20.0000 mg | ORAL_TABLET | Freq: Every day | ORAL | Status: DC

## 2024-03-27 NOTE — Hospital Course (Signed)
 63 y.o. male with medical history significant for uncontrolled type 2 diabetes, HIV, hypertension, hyperlipidemia, chronic HFpEF, chronic depression, nonadherence to medication, recently admitted at [facility] for hyperglycemia and right wrist pain and cramping, on 03/23/2024 and released on 03/24/2024 with prescription for basal insulin Lantus, who presents to the ER with complaints of hyperglycemia with blood sugar in the 600s.  Associated with recurrence of his right wrist cramping.   In the ER serum glucose in the 600 with no ketonuria and no anion gap acidosis.  Creatinine elevated above baseline.  The patient received IV fluid hydration and subcu insulin with improvement.  The patient denies taking his insulin at home.  He has no other complaints.  TRH, hospitalist service, was asked to admit.
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
## 2024-03-27 NOTE — Discharge Summary (Signed)
 Physician Discharge Summary   Patient: Craig [name] MRN: [id number] DOB: 24-Jun-1961  Admit date:     03/26/2024  Discharge date: 03/27/24  Discharge Physician: Rickey Barbara   PCP: Dwana Melena, PA   Recommendations at discharge:    Follow up with PCP in 1-2 weeks Follow up with Orthopedic Surgery as scheduled  Discharge Diagnoses: Principal Problem:   Hyperglycemia  Resolved Problems:   * No resolved hospital problems. *  Hospital Course: 63 y.o. male with medical history significant for uncontrolled type 2 diabetes, HIV, hypertension, hyperlipidemia, chronic HFpEF, chronic depression, nonadherence to medication, recently admitted at [facility] for hyperglycemia and right wrist pain and cramping, on 03/23/2024 and released on 03/24/2024 with prescription for basal insulin Lantus, who presents to the ER with complaints of hyperglycemia with blood sugar in the 600s.  Associated with recurrence of his right wrist cramping.   In the ER serum glucose in the 600 with no ketonuria and no anion gap acidosis.  Creatinine elevated above baseline.  The patient received IV fluid hydration and subcu insulin with improvement.  The patient denies taking his insulin at home.  He has no other complaints.  [facility], hospitalist service, was asked to admit.   Assessment and Plan: Type 2 diabetes with hyperglycemia Presented with blood sugars in the 600's In the ER serum glucose in the 600's with no ketonuria and no anion gap metabolic acidosis A1c 12.7 Given IVF hydration this visit Pt was continued on aspart 5 units with meals and 10 units lantus bid with much better glycemic control Seen by diabetic coordinator   Pseudohyponatremia Unremarkable   Mild non-anion gap metabolic acidosis Given IVF hydration this visit   AKI, suspect multifactorial, including prerenal in the setting of hyperglycemia Baseline creatinine appears to be 1.3 with GFR greater than 60 Presented with creatinine 1.69 with  GFR 46 Given IVF hydration. Cr improved  Limited ROM of Right wrist and hand Electrolytes were corrected Hand xray ordered and reviewed. Minimal arthritic changes noted In light of poorly controlled glucose, diabetic chieroarhtropathy is high up on differential Discussed case with Hand Surgeon on call who agrees with supportive care and for close follow up with pt's primary Orthopedic Surgeon No evidence of acute infection involving R hand   HIV Resumed home regimen Follows with ID outpatient   History of pulmonary embolism, on Eliquis Resume home Eliquis   Obesity BMI 30 Recommend weight loss outpatient with regular physical activity and healthy dieting.   Chronic HFpEF Euvolemic on exam   Hypertension Resumed home Toprol-XL Monitor vital signs   Hyperlipidemia Resume home Lipitor   Chronic depression Resume home Zoloft       Consultants: Discussed case with Hand Surgeon Procedures performed:   Disposition: Home Diet recommendation:  Carb modified diet DISCHARGE MEDICATION: Allergies as of 03/27/2024       Reactions   Penicillins Hives, Rash   Has patient had a PCN reaction causing immediate rash, facial/tongue/throat swelling, SOB or lightheadedness with hypotension:YES Has patient had a PCN reaction causing Has patient had a PCN reaction that required hospitalization NO Has patient had a PCN reaction occurring within the last 10 years: YES If all of the above answers are "NO", then may proceed with Cephalosporin use.   Shellfish Allergy Shortness Of Breath   Latex Rash   Fish Allergy Other (See Comments)   dislikes   Metformin Nausea Only   Both IR and XR formulations Both IR and XR formulations  Both IR and  XR formulations        Medication List     STOP taking these medications    Dupixent 300 MG/2ML prefilled syringe Generic drug: dupilumab   Rybelsus 14 MG Tabs Generic drug: Semaglutide       TAKE these medications    acetaminophen  500 MG tablet Commonly known as: TYLENOL Take 1,000 mg by mouth every 6 (six) hours as needed for moderate pain or headache.   atorvastatin 40 MG tablet Commonly known as: LIPITOR Take 1 tablet (40 mg total) by mouth daily.   betamethasone dipropionate 0.05 % cream Apply topically 2 (two) times daily.   Biktarvy 50-200-25 MG Tabs tablet Generic drug: bictegravir-emtricitabine-tenofovir AF Take 1 tablet by mouth daily.   Blood Glucose Monitoring Suppl Devi 1 each by Does not apply route 3 (three) times daily. May dispense any manufacturer covered by patient's insurance.   BLOOD GLUCOSE TEST STRIPS Strp 1 each by Does not apply route 3 (three) times daily. Use as directed to check blood sugar. May dispense any manufacturer covered by patient's insurance and fits patient's device.   ciclopirox 8 % solution Commonly known as: PENLAC Apply 1 Application topically at bedtime.   colchicine 0.6 MG tablet Take 0.6 mg by mouth daily as needed (Gout).   cyclobenzaprine 5 MG tablet Commonly known as: FLEXERIL Take 1 tablet (5 mg total) by mouth 3 (three) times daily as needed for muscle spasms.   Eliquis 5 MG Tabs tablet Generic drug: apixaban Take 5 mg by mouth 2 (two) times daily.   empagliflozin 25 MG Tabs tablet Commonly known as: JARDIANCE Take 25 mg by mouth daily.   furosemide 20 MG tablet Commonly known as: LASIX Take 1 tablet (20 mg total) by mouth daily.   hydrocortisone 2.5 % ointment Apply 1 Application topically 2 (two) times daily.   hydrOXYzine 25 MG tablet Commonly known as: ATARAX Take 1 tablet (25 mg total) by mouth at bedtime as needed. What changed:  how much to take when to take this   insulin aspart 100 UNIT/ML FlexPen Commonly known as: NOVOLOG Inject 5 Units into the skin 3 (three) times daily with meals. Only take if eating a meal AND Blood Glucose (BG) is 80 or higher.   insulin glargine 100 UNIT/ML Solostar Pen Commonly known as:  LANTUS Inject 10 Units into the skin 2 (two) times daily. What changed:  how much to take when to take this   insulin glargine 100 UNIT/ML Solostar Pen Commonly known as: LANTUS Inject 10 Units into the skin 2 (two) times daily. May substitute as needed per insurance. What changed: You were already taking a medication with the same name, and this prescription was added. Make sure you understand how and when to take each.   Lancet Device Misc 1 each by Does not apply route 3 (three) times daily. May dispense any manufacturer covered by patient's insurance.   Lancets Misc 1 each by Does not apply route 3 (three) times daily. Use as directed to check blood sugar. May dispense any manufacturer covered by patient's insurance and fits patient's device.   lisinopril 10 MG tablet Commonly known as: ZESTRIL Take 10 mg by mouth daily.   methocarbamol 750 MG tablet Commonly known as: ROBAXIN Take 750 mg by mouth 4 (four) times daily as needed.   metoprolol succinate 25 MG 24 hr tablet Commonly known as: Toprol XL Take 0.5 tablets (12.5 mg total) by mouth daily. Start taking on: March 28, 2024 What changed:  how much to take Another medication with the same name was removed. Continue taking this medication, and follow the directions you see here.   omeprazole 40 MG capsule Commonly known as: PRILOSEC Take 40 mg by mouth daily.   Pen Needles 31G X 5 MM Misc 1 each by Does not apply route 3 (three) times daily. May dispense any manufacturer covered by patient's insurance.   sertraline 50 MG tablet Commonly known as: ZOLOFT Take 50 mg by mouth daily.   triamcinolone ointment 0.1 % Commonly known as: KENALOG Apply 1 application topically 2 (two) times daily as needed (itching).        Follow-up Information     Salli Quarry B, PA Follow up in 2 week(s).   Specialty: Internal Medicine Why: Hospital follow up Contact information: [address] [facility] [id number] [facility]  [place] [address] [phone]                Discharge Exam: Ceasar Mons Weights   03/26/24 2141  Weight: 102.5 kg   General exam: Awake, laying in bed, in nad Respiratory system: Normal respiratory effort, no wheezing Cardiovascular system: regular rate, s1, s2 Gastrointestinal system: Soft, nondistended, positive BS Central nervous system: CN2-12 grossly intact, strength intact Extremities: Perfused, no clubbing Skin: Normal skin turgor, no notable skin lesions seen Psychiatry: Mood normal // no visual hallucinations   Condition at discharge: fair  The results of significant diagnostics from this hospitalization (including imaging, microbiology, ancillary and laboratory) are listed below for reference.   Imaging Studies: DG Hand Complete Right Result Date: 03/27/2024 CLINICAL DATA:  Right-sided hand pain EXAM: RIGHT HAND - COMPLETE 3+ VIEW COMPARISON:  Wrist x-ray 08/24/2021 FINDINGS: No fracture or malalignment. Minimal degenerative change at the first [facility] and MCP joints. Minimal spurring at the second and third MCP joints. No erosions. Soft tissues are unremarkable IMPRESSION: Minimal degenerative changes. Electronically Signed   By: Jasmine Pang M.D.   On: 03/27/2024 17:40   CT HEAD WO CONTRAST Result Date: 03/26/2024 CLINICAL DATA:  Neuro deficit, acute, stroke suspected EXAM: CT HEAD WITHOUT CONTRAST TECHNIQUE: Contiguous axial images were obtained from the base of the skull through the vertex without intravenous contrast. RADIATION DOSE REDUCTION: This exam was performed according to the departmental dose-optimization program which includes automated exposure control, adjustment of the mA and/or kV according to patient size and/or use of iterative reconstruction technique. COMPARISON:  06/24/2021 FINDINGS: Brain: No acute intracranial abnormality. Specifically, no hemorrhage, hydrocephalus, mass lesion, acute infarction, or significant intracranial injury. Physiologic  calcifications in the basal ganglia. Vascular: No hyperdense vessel or unexpected calcification. Skull: No acute calvarial abnormality. Sinuses/Orbits: No acute findings Other: None IMPRESSION: No acute intracranial abnormality. Electronically Signed   By: Charlett Nose M.D.   On: 03/26/2024 23:58    Microbiology: Results for orders placed or performed during the hospital encounter of 02/03/23  Resp panel by RT-PCR (RSV, Flu A&B, Covid) Anterior Nasal Swab     Status: None   Collection Time: 02/03/23  7:26 PM   Specimen: Anterior Nasal Swab  Result Value Ref Range Status   SARS Coronavirus 2 by RT PCR NEGATIVE NEGATIVE Final   Influenza A by PCR NEGATIVE NEGATIVE Final   Influenza B by PCR NEGATIVE NEGATIVE Final    Comment: (NOTE) The Xpert Xpress SARS-CoV-2/FLU/RSV plus assay is intended as an aid in the diagnosis of influenza from Nasopharyngeal swab specimens and should not be used as a sole basis for treatment. Nasal washings and aspirates  are unacceptable for Xpert Xpress SARS-CoV-2/FLU/RSV testing.  Fact Sheet for Patients: BloggerCourse.com  Fact Sheet for Healthcare Providers: SeriousBroker.it  This test is not yet approved or cleared by the [place] FDA and has been authorized for detection and/or diagnosis of SARS-CoV-2 by FDA under an Emergency Use Authorization (EUA). This EUA will remain in effect (meaning this test can be used) for the duration of the COVID-19 declaration under Section 564(b)(1) of the Act, 21 U.S.C. section 360bbb-3(b)(1), unless the authorization is terminated or revoked.     Resp Syncytial Virus by PCR NEGATIVE NEGATIVE Final    Comment: (NOTE) Fact Sheet for Patients: BloggerCourse.com  Fact Sheet for Healthcare Providers: SeriousBroker.it  This test is not yet approved or cleared by the [place] FDA and has been authorized for  detection and/or diagnosis of SARS-CoV-2 by FDA under an Emergency Use Authorization (EUA). This EUA will remain in effect (meaning this test can be used) for the duration of the COVID-19 declaration under Section 564(b)(1) of the Act, 21 U.S.C. section 360bbb-3(b)(1), unless the authorization is terminated or revoked.  Performed at [facility], [address] [place], [place] [address]     Labs: CBC: Recent Labs  Lab 03/26/24 2148 03/26/24 2149 03/27/24 0545  WBC 5.5  --  4.9  NEUTROABS  --   --  2.4  HGB 12.5* 14.6 11.9*  HCT 39.3 43.0 36.9*  MCV 83.8  --  83.1  PLT 231  --  203   Basic Metabolic Panel: Recent Labs  Lab 03/26/24 2148 03/26/24 2149 03/26/24 2239 03/27/24 0545  NA 128* 133* 127* 134*  K 3.5 3.5 3.5 3.1*  CL 98 98 95* 101  CO2 21*  --  21* 22  GLUCOSE 605* 612* 652* 333*  BUN 11 12 12 11   CREATININE 1.67* 1.80* 1.69* 1.40*  CALCIUM 8.6*  --  8.6* 8.5*  MG  --   --   --  2.2  PHOS  --   --   --  3.9   Liver Function Tests: Recent Labs  Lab 03/26/24 2239  AST 24  ALT 20  ALKPHOS 81  BILITOT 0.5  PROT 8.0  ALBUMIN 2.9*   CBG: Recent Labs  Lab 03/27/24 0230 03/27/24 0441 03/27/24 0808 03/27/24 1150 03/27/24 1609  GLUCAP 330* 337* 340* 244* 117*    Discharge time spent: less than 30 minutes.  Signed: Rickey Barbara, MD [facility] 03/27/2024

## 2024-03-27 NOTE — Inpatient Diabetes Management (Signed)
 Inpatient Diabetes Program Recommendations  AACE/ADA: New Consensus Statement on Inpatient Glycemic Control (2015)  Target Ranges:  Prepandial:   less than 140 mg/dL      Peak postprandial:   less than 180 mg/dL (1-2 hours)      Critically ill patients:  140 - 180 mg/dL   Lab Results  Component Value Date   GLUCAP 244 (H) 03/27/2024   HGBA1C 12.7 (H) 03/26/2024    Latest Reference Range & Units 03/27/24 00:51 03/27/24 02:30 03/27/24 04:41 03/27/24 08:08 03/27/24 11:50  Glucose-Capillary 70 - 99 mg/dL 829 (H) Novolog 10 units 330 (H) Novolog 4 units 337 (H) 340 (H) Novolog 20 units 244 (H) Novolog 12 units  (H): Data is abnormally high  Diabetes history: DM2 Outpatient Diabetes medications: Lantus 12 units daily, Jardiance 10 mg daily, Rybellsus 12 mg daily (insulin started on Tuesday April 1st) Current orders for Inpatient glycemic control: Semglee 10 units bid, Novolog 5 units tid, Novolog 0-20 units tid, 0-5 units hs correction  Inpatient Diabetes Program Recommendations:   Met with patient in ED regarding diabetes management. Patient was on insulin several years ago, but was ordered to stop insulin due to CBGs doing well. Patient was on basal bolus in the past and reviewed difference in long and short acting insulin with patient. Ordered living well with diabetes booklet for patient as well and insulin pen starter kit. Reviewed basic nutrition with patient and patient has not been drinking sugary drinks and limiting carbohydrates. Reviewed need for followup and check A1c every 3 months. Patient received a glucose meter and supplies on Tuesday and agrees to check CBGs qid.  Thank you, Craig [name]. Jeannine Pennisi, RN, MSN, CDCES  Diabetes Coordinator Inpatient Glycemic Control Team Team Pager [phone] (8am-5pm) 03/27/2024 3:07 PM

## 2024-03-28 ENCOUNTER — Emergency Department: Admit: 2024-03-28 | Discharge: 2024-03-28 | Disposition: A | Discharge status: Home / Self Care

## 2024-03-28 ENCOUNTER — Other Ambulatory Visit (HOSPITAL_COMMUNITY): Payer: Self-pay

## 2024-03-28 DIAGNOSIS — R739 Hyperglycemia, unspecified: Principal | ICD-10-CM

## 2024-03-28 LAB — CBC W/ AUTO DIFF
BASOPHILS ABSOLUTE COUNT: 0 10*9/L (ref 0.0–0.1)
BASOPHILS RELATIVE PERCENT: 0.3 %
EOSINOPHILS ABSOLUTE COUNT: 0 10*9/L (ref 0.0–0.5)
EOSINOPHILS RELATIVE PERCENT: 0.2 %
HEMATOCRIT: 40 % (ref 39.0–48.0)
HEMOGLOBIN: 12.7 g/dL — ABNORMAL LOW (ref 12.9–16.5)
LYMPHOCYTES ABSOLUTE COUNT: 1.4 10*9/L (ref 1.1–3.6)
LYMPHOCYTES RELATIVE PERCENT: 22.3 %
MEAN CORPUSCULAR HEMOGLOBIN CONC: 31.8 g/dL — ABNORMAL LOW (ref 32.0–36.0)
MEAN CORPUSCULAR HEMOGLOBIN: 27.1 pg (ref 25.9–32.4)
MEAN CORPUSCULAR VOLUME: 85 fL (ref 77.6–95.7)
MEAN PLATELET VOLUME: 8.2 fL (ref 6.8–10.7)
MONOCYTES ABSOLUTE COUNT: 0.2 10*9/L — ABNORMAL LOW (ref 0.3–0.8)
MONOCYTES RELATIVE PERCENT: 2.6 %
NEUTROPHILS ABSOLUTE COUNT: 4.7 10*9/L (ref 1.8–7.8)
NEUTROPHILS RELATIVE PERCENT: 74.6 %
NUCLEATED RED BLOOD CELLS: 0 /100{WBCs} (ref <=4)
PLATELET COUNT: 233 10*9/L (ref 150–450)
RED BLOOD CELL COUNT: 4.7 10*12/L (ref 4.26–5.60)
RED CELL DISTRIBUTION WIDTH: 16.6 % — ABNORMAL HIGH (ref 12.2–15.2)
WBC ADJUSTED: 6.3 10*9/L (ref 3.6–11.2)

## 2024-03-28 LAB — URINALYSIS WITH MICROSCOPY
BACTERIA: NONE SEEN /HPF
BILIRUBIN UA: NEGATIVE
GLUCOSE UA: 500 — AB
KETONES UA: 15 — AB
LEUKOCYTE ESTERASE UA: NEGATIVE
NITRITE UA: NEGATIVE
PH UA: 5.5 (ref 5.0–7.0)
PROTEIN UA: >300 — AB
RBC UA: <1 /HPF (ref 0–3)
SPECIFIC GRAVITY UA: 1.015 (ref 1.005–1.030)
SQUAMOUS EPITHELIAL: <1 /HPF (ref 0–5)
UROBILINOGEN UA: 0.2
WBC UA: <1 /HPF (ref 0–3)

## 2024-03-28 LAB — MAGNESIUM: MAGNESIUM: 1.6 mg/dL (ref 1.6–2.2)

## 2024-03-28 LAB — COMPREHENSIVE METABOLIC PANEL
ALBUMIN: 3.9 g/dL (ref 3.4–5.0)
ALKALINE PHOSPHATASE: 94 U/L (ref 38–126)
ALT (SGPT): 25 U/L (ref <50)
ANION GAP: 14 mmol/L (ref 5–14)
AST (SGOT): 38 U/L (ref 19–55)
BILIRUBIN TOTAL: 0.6 mg/dL (ref 0.1–1.2)
BLOOD UREA NITROGEN: 12 mg/dL (ref 7–21)
BUN / CREAT RATIO: 11
CALCIUM: 8.8 mg/dL (ref 8.5–10.2)
CHLORIDE: 103 mmol/L (ref 98–107)
CO2: 17 mmol/L — ABNORMAL LOW (ref 22.0–32.0)
CREATININE: 1.1 mg/dL (ref 0.70–1.30)
EGFR CKD-EPI (2021) MALE: 76 mL/min/1.73m2 (ref >=60)
GLUCOSE RANDOM: 557 mg/dL (ref 74–106)
POTASSIUM: 4.4 mmol/L (ref 3.5–5.0)
PROTEIN TOTAL: 8.6 g/dL — ABNORMAL HIGH (ref 6.5–8.3)
SODIUM: 134 mmol/L — ABNORMAL LOW (ref 135–145)

## 2024-03-28 LAB — LACTATE SEPSIS: LACTATE: 2.2 mmol/L (ref 0.7–2.0)

## 2024-03-28 LAB — TROPONIN I: TROPONIN I: <0.034 ng/mL (ref <0.034)

## 2024-03-28 MED ORDER — INSULIN GLARGINE (U-100) 100 UNIT/ML (3 ML) SUBCUTANEOUS PEN
Freq: Every evening | SUBCUTANEOUS | 0 refills | 30.00 days | Status: CP
Start: 2024-03-28 — End: 2024-04-27

## 2024-03-28 MED ORDER — GABAPENTIN 100 MG CAPSULE
ORAL_CAPSULE | Freq: Three times a day (TID) | ORAL | 0 refills | 30.00 days | Status: CP
Start: 2024-03-28 — End: 2024-04-27

## 2024-03-28 MED ORDER — INSULIN ASPART (U-100) 100 UNIT/ML (3 ML) SUBCUTANEOUS PEN
Freq: Three times a day (TID) | SUBCUTANEOUS | 0 refills | 30.00 days | Status: CP
Start: 2024-03-28 — End: 2024-04-27

## 2024-03-28 MED ADMIN — ketorolac (TORADOL) injection 15 mg: 15 mg | INTRAVENOUS | @ 20:00:00 | Stop: 2024-03-28

## 2024-03-28 MED ADMIN — sodium chloride 0.9% (NS) bolus 1,000 mL: 1000 mL | INTRAVENOUS | @ 18:00:00 | Stop: 2024-03-28

## 2024-03-28 MED ADMIN — acetaminophen (TYLENOL) tablet 650 mg: 650 mg | ORAL | @ 18:00:00 | Stop: 2024-03-28

## 2024-03-28 NOTE — Unmapped (Signed)
 Bed: 06  Expected date:   Expected time:   Means of arrival:   Comments:  EMS

## 2024-03-28 NOTE — Unmapped (Signed)
 EMERGENCY MEDICINE    CHIEF COMPLAINT:  Chief Complaint   Patient presents with    Arm Pain    Hypertension     HPI:  Mackay Hanauer is a 63 y.o. male with pmh of HLD, PE, eczema, asthma, MI, HIV, DM, HLD, CAD, GERD, HTN; presenting via EMS for right arm pain and weakness.  Patient states he has had intermittent right arm weakness for the past month, has been admitted multiple times since his right arm weakness is started.  Reports right arm pain was worse, when his sugar was 1000.  He was discharged from the hospital and prescribed Lantus and NovoLog.  States these prescriptions were not available at the [facility] pharmacy at the time, reports they were out of stock, as he did not pick them up.  Tried to have them sent to [facility], but reports that his insurance will not fill them until April 10.  This patient has not been taking his Lantus or NovoLog.  States his home glucometer was running high all last night, which ultimately made him concerned and present to the emergency department.    Discharge Summary 03/24/24  #Hyperglycemia   #T2DM   Pt found to have BG >900 on presentation, however no anion gap or acidosis on initial labs. Per patient he has not taken any medications in about a month because he was having increased abd pain and wasn't sure which medication might be causing it. He had been prescribed Rybelsus and Jardiance and had A1C 8.2 at PCP visit 2 weeks ago. A1C on presentation now 12. He denies any infectious symptoms and has no signs of infection on evaluation. Hyperglycemia likely 2/2 no medications for at least a month. Pt blood sugar improved w/ 2 L of fluids and insulin (received 10 units lantus and 5u regular insulin in ED). He is open to re-starting insulin (had been on insulin several years ago. Made plan to re-start at 12u lantus nightly and gave patient instructions on how to titrate insulin at home. Holding Rybelsus for now in case this was contributing to patient's abdomina symptoms that caused him to stop all meds. Defer to PCP to re-start.     PAST MEDICAL HISTORY:  Past Medical History:   Diagnosis Date    Asthma     Asymptomatic human immunodeficiency virus (HIV) infection status 04/09/2013    Diagnosed in 2005 with a CD4 of 32 and on at least one prior regimen on a study, then switched to Truvada with ritonavir boosted atazanavir. Changed to Truvada with boosted darunavir secondary to reflux symptoms. Has remained undetectable for several years with CD4 count greater than 500.      CAD (coronary artery disease)     Depression     Diabetes mellitus     DVT (deep venous thrombosis)     Eczema     Enlargement of liver     GERD (gastroesophageal reflux disease)     HIV (human immunodeficiency virus infection)     Hyperlipidemia     Hypertension     Myocardial infarction     Obesity     Pulmonary embolism 2012    Sleep apnea        PAST SURGICAL HISTORY:   has a past surgical history that includes pr colonoscopy flx dx w/collj spec when pfrmd (N/A, 11/11/2013); Skin biopsy; and pr upper gi endoscopy,diagnosis (N/A, 04/01/2019).    MEDICATIONS:  Prior to Admission medications    Medication Sig Start Date Authorizing  Provider   atorvastatin (LIPITOR) 40 MG tablet Take 1 tablet (40 mg total) by mouth daily. 01/02/22 Consuelo Denmark, PA   betamethasone dipropionate 0.05 % cream Apply 1 Application topically two (2) times a day. 08/28/22 [provider]   BIKTARVY 50-200-25 mg tablet Take 1 tablet by mouth daily. 12/11/23 Wright, Kaylor E, MD   blood sugar diagnostic (GLUCOSE BLOOD) Strp Use to check blood sugar as directed with insulin 3 times a day & for symptoms of high or low blood sugar. 03/24/24 Bohdan Bush, MD   blood-glucose meter kit Use as instructed 03/24/24 Bohdan Bush, MD   cetirizine (ZYRTEC) 10 MG tablet Take 1 tablet (10 mg total) by mouth two (2) times a day. 09/25/23 Anselm Basset, MD   colchicine (COLCRYS) 0.6 mg tablet TAKE 1 TABLET BY MOUTH DAILY AS NEEDED FOR GOUT 12/14/23 Geralyn Knee, Connye Delaine, PA   dupilumab (DUPIXENT SYRINGE) 300 mg/2 mL Syrg injection Inject 2 mL (300 mg total) under the skin every fourteen (14) days. For maintenance dose 08/14/23 Turner, Mikie Ales, PA   dupilumab (DUPIXENT SYRINGE) 300 mg/2 mL Syrg injection Inject the contents of 2 pens (600 mg) under the skin once for loading dose 08/14/23 Turner, Mikie Ales, PA   ELIQUIS 5 mg Tab Take 1 tablet (5 mg total) by mouth two (2) times a day. 04/01/23 [provider]   empagliflozin (JARDIANCE) 25 mg tablet Take 1 tablet (25 mg total) by mouth daily. 06/19/23 Consuelo Denmark, PA   furosemide (LASIX) 20 MG tablet Take 1 tablet (20 mg total) by mouth daily. 02/03/23 [provider]   hydrocortisone 2.5 % ointment Apply 1 Application topically two (2) times a day. To active areas of the face until smooth 09/25/23 Anselm Basset, MD   hydrOXYzine (ATARAX) 25 MG tablet Take 1 tablet (25 mg total) by mouth every six (6) hours as needed. 01/21/23 [provider]   insulin glargine (BASAGLAR, LANTUS) 100 unit/mL (3 mL) injection pen Inject 0.12 mL (12 Units total) under the skin nightly. 03/24/24 Bohdan Bush, MD   lancets Misc Use to check blood sugar as directed with insulin 3 times a day & for symptoms of high or low blood sugar. 03/24/24 Bohdan Bush, MD   lisinopril (PRINIVIL,ZESTRIL) 10 MG tablet Take 1 tablet (10 mg total) by mouth daily. 03/24/24 Bohdan Bush, MD   methocarbamoL (ROBAXIN) 750 MG tablet 750mg  4x daily prn muscle spasms 10/27/21 Consuelo Denmark, PA   metoprolol succinate (TOPROL-XL) 25 MG 24 hr tablet Take 1 tablet (25 mg total) by mouth daily. 10/27/21 Consuelo Denmark, PA   omeprazole (PRILOSEC) 40 MG capsule 40mg  BID 03/09/24 Consuelo Denmark, PA   pen needle, diabetic 32 gauge x 5/32 (4 mm) Ndle Use with insulin up to 4 times/day as needed. 03/24/24 Bohdan Bush, MD   semaglutide (RYBELSUS) 14 mg Tab Take 14 mg by mouth daily. 06/29/23 Consuelo Denmark, PA   sertraline (ZOLOFT) 50 MG tablet Take 1 tablet (50 mg total) by mouth daily. 03/24/24 Bohdan Bush, MD   triamcinolone (KENALOG) 0.1 % ointment Apply topically two (2) times a day. To active areas of trunk and extremities until smooth 09/25/23 Anselm Basset, MD   citalopram (CELEXA) 40 MG tablet Take 1 tablet (40 mg total) by mouth daily. 09/02/17 Consuelo Denmark, PA     ALLERGIES:  Allergies   Allergen Reactions    Latex Rash  Shellfish Containing Products      Other reaction(s): SHORTNESS OF BREATH    Metformin Nausea Only     Both IR and XR formulations    Both IR and XR formulations   Both IR and XR formulations    Both IR and XR formulations      Both IR and XR formulations Both IR and XR formulations    Penicillins Rash     FAMILY HISTORY:  Family History   Problem Relation Age of Onset    Diabetes Mother     Heart disease Mother     Hypertension Mother     Arthritis Mother     Aneurysm Father     Skin cancer Neg Hx     Melanoma Neg Hx     Basal cell carcinoma Neg Hx     Squamous cell carcinoma Neg Hx      SOCIAL HISTORY:  Social History     Tobacco Use    Smoking status: Never     Passive exposure: Never    Smokeless tobacco: Never   Substance Use Topics    Alcohol use: No     Alcohol/week: 0.0 standard drinks of alcohol     REVIEW OF SYSTEMS:   Constitutional: Negative for weight loss.    HENT: Negative for nose bleeds.    Eyes: Negative for discharge.    Respiratory: Negative for stridor.    Cardiovascular: Negative for palpitations.    Gastrointestinal: Negative for blood in stoo.   Endocrine: Negative hot flashes.    Genitourinary: Negative for discharge.    Skin: Negative for rash.    Psychiatric/Behavioral: Negative.    All other systems reviewed and are negative.    PHYSICAL EXAM:  Vitals:    03/28/24 1316 03/28/24 1500 03/28/24 1717   BP: 181/101 166/90 138/78   Pulse: 110 102 82   Resp: 18 14 18    Temp: 36.4 ??C (97.5 ??F)  36.1 ??C (96.9 ??F)   TempSrc: Temporal  Oral SpO2: 97% 99% 99%   Weight: (!) 109.8 kg (242 lb)     Height: 182.9 cm (6')       Constitutional: Alert and oriented.   Eyes: Conjunctivae are normal.  ENT       Head: Normocephalic and atraumatic.       Nose: No congestion.       Mouth/Throat: Mucous membranes are moist.       Neck: No stridor.  Cardiovascular: Normal rate, regular rhythm. Extremities warm.  Respiratory: Normal respiratory effort, breathing comfortably on room air.   Gastrointestinal: Soft, nontender and nondistended.   Musculoskeletal: Nontender and non-edematous with equal range of motion in all extremities, with exception to right upper extremity which has intermittent spastic movements  Neurologic: Normal speech and language. No gross focal neurologic deficits are appreciated.  Skin: Skin is warm, dry and intact. No rash noted on visible skin.  Psychiatric: Mood and affect are normal.    DIAGNOSTICS:     Imaging:  No orders to display     Labs:  Labs Reviewed   COMPREHENSIVE METABOLIC PANEL - Abnormal; Notable for the following components:       Result Value    Sodium 134 (*)     CO2 17.0 (*)     Glucose 557 (*)     Total Protein 8.6 (*)     All other components within normal limits   LACTATE SEPSIS - Abnormal; Notable for the following components:  Lactate 2.2 (*)     All other components within normal limits   URINALYSIS WITH MICROSCOPY - Abnormal; Notable for the following components:    Protein, UA >300 mg/dl (*)     Glucose, UA 161 mg/dL (*)     Ketones, UA 15 mg/dL (*)     Blood, UA Trace (*)     All other components within normal limits   POCT GLUCOSE, INTERFACED - Abnormal; Notable for the following components:    Glucose, POC 509 (*)     All other components within normal limits   POCT VENOUS BLOOD GAS - Abnormal; Notable for the following components:    pCO2, Ven 38.2 (*)     pO2, Ven 52.9 (*)     HCO3, Ven 20.0 (*)     Base Excess, Ven -5.5 (*)     O2 Saturation, Venous 84.7 (*)     All other components within normal limits POCT GLUCOSE, INTERFACED - Abnormal; Notable for the following components:    Glucose, POC 401 (*)     All other components within normal limits   CBC W/ AUTO DIFF - Abnormal; Notable for the following components:    HGB 12.7 (*)     MCHC 31.8 (*)     RDW 16.6 (*)     Absolute Monocytes 0.2 (*)     All other components within normal limits   TROPONIN I - Normal   MAGNESIUM - Normal   CBC W/ DIFFERENTIAL    Narrative:     The following orders were created for panel order CBC w/ Differential.  Procedure                               Abnormality         Status                     ---------                               -----------         ------                     CBC w/ [id number]]         Abnormal            Final result                 Please view results for these tests on the individual orders.   EXTRA TUBES    Narrative:     The following orders were created for panel order ED Extra Tubes.  Procedure                               Abnormality         Status                     ---------                               -----------         ------                     LIGHT BLUE CITRATE EXTR.Aaron AasAaron Aas[[id number]]  Final result                 Please view results for these tests on the individual orders.   EXTRA TUBES    Narrative:     The following orders were created for panel order Extra Lab Tubes.  Procedure                               Abnormality         Status                     ---------                               -----------         ------                     GOLD SST ZOXWR UEAV[[id number]]                             Final result                 Please view results for these tests on the individual orders.   POCT GLUCOSE-RN OBTAIN   LIGHT BLUE CITRATE EXTRA TUBE    Narrative:     Collected and received in lab.   GOLD SST EXTRA TUBE    Narrative:     Collected and received in lab.     Orders Placed This Encounter   Procedures    Troponin I    CBC w/ Differential    Comprehensive metabolic panel    Lactate Sepsis    ED Extra Tubes    Urinalysis with Microscopy (Clean Catch)    Magnesium    Extra Lab Tubes    ECG 12 Lead     Medications   sodium chloride 0.9% (NS) bolus 1,000 mL (0 mL Intravenous Stopped 03/28/24 1441)   ketorolac (TORADOL) injection 15 mg (15 mg Intravenous Given 03/28/24 1600)     ED COURSE AND MEDICAL DECISION MAKING:   Akil Hoos is a 63 y.o. male with pmh of HLD, PE, eczema, asthma, MI, HIV, DM, HLD, CAD, GERD, HTN; presenting with weakness and right upper extremity pain.  Vitals within normal limits.  On examination, right upper extremity does have intermittent spastic movements, it is weak in comparison to the left, but patient is able to make purposeful movement with the right arm.  I have a low suspicion for stroke, given presentation and duration of symptoms.  Considered focal seizure and vascular injury, again think these less likely.  After pain control and improvement of blood sugar, arm spasming improved and pain improved.  No further imaging or workup indicated at this time.    Patient found to be hyperglycemic, but not signs of HHS or DKA.  Treated with IV fluids.  No signs of occult infection or infarction as cause of underlying hyperglycemia, more likely due to noncompliance.  I have sent out a prescription again to a local pharmacy, I have explained to the patient that these medications are available on the $4 list, which should be affordable to him.  He states understanding.  Glucose was downtrending after IV fluids, insulin was not given here.  Recommend close outpatient follow-up with primary care.  Progress Notes:  ED Course as of 03/29/24 1952   Sat Mar 28, 2024   1316 BP: 181/101   1324 On my independent interpretation, ECG with normal sinus rhythm, no ST elevations or T wave inversions concerning for acute ischemia, no findings concerning for arrhythmia, such as Brugada, WPW, V. Tach, significantly prolonged QT, or HOCM.   1355 pH, Venous: 7.33   1355 Glucose, POC(!!): 509   1419 CO2(!): 17.0   1419 Anion Gap: 14   1419 Potassium: 4.4   1419 Calcium: 8.8   1419 Lactate, Venous(!!): 2.2   1638 Glucose, POC(!): 401  Downtrending appropriately after fluids   1638 BP: 166/90     Barriers to care: Poor medical literacy    FINAL IMPRESSION:  Final diagnoses:   Hyperglycemia (Primary)     Rx:   Discharge Medication List as of 03/28/2024  5:05 PM        START taking these medications    Details   gabapentin (NEURONTIN) 100 MG capsule Take 1 capsule (100 mg total) by mouth Three (3) times a day., Starting Sat 03/28/2024, Until Mon 04/27/2024, Normal      insulin aspart (NOVOLOG FLEXPEN) 100 unit/mL (3 mL) injection pen Inject 0.05 mL (5 Units total) under the skin Three (3) times a day before meals., Starting Sat 03/28/2024, Until Mon 04/27/2024, Normal               Johnie Nailer, MD  03/29/24 [phone]

## 2024-03-28 NOTE — Unmapped (Signed)
 Pt BIB [facility] for R arm pain. Pt reports intermittent 10/10 pain in the R arm with weakness. Seen here for similar Monday 3/31. Per EMS, pt's BG was 553 en route with a BP of 212/110.

## 2024-03-30 ENCOUNTER — Ambulatory Visit: Admit: 2024-03-30

## 2024-03-30 ENCOUNTER — Ambulatory Visit
Admit: 2024-03-30 | Discharge: 2024-04-06 | Disposition: A | Discharge status: Rehabilitation Facility | Payer: Medicare (Managed Care)

## 2024-03-30 ENCOUNTER — Ambulatory Visit: Admit: 2024-03-30 | Payer: Medicare (Managed Care)

## 2024-03-30 ENCOUNTER — Inpatient Hospital Stay
Admit: 2024-03-30 | Discharge: 2024-04-06 | Disposition: A | Discharge status: Rehabilitation Facility | Payer: Medicare (Managed Care)

## 2024-03-30 LAB — BLOOD GAS, VENOUS
BASE EXCESS VENOUS: -2.7 — ABNORMAL LOW (ref -2.0–2.0)
HCO3 VENOUS: 23 mmol/L (ref 22–27)
O2 SATURATION VENOUS: 87 % — ABNORMAL HIGH (ref 40.0–85.0)
PCO2 VENOUS: 42 mmHg (ref 40–60)
PH VENOUS: 7.35 (ref 7.32–7.43)
PO2 VENOUS: 56 mmHg — ABNORMAL HIGH (ref 30–55)

## 2024-03-30 LAB — CBC W/ AUTO DIFF
BASOPHILS ABSOLUTE COUNT: 0 10*9/L (ref 0.0–0.1)
BASOPHILS RELATIVE PERCENT: 0.5 %
EOSINOPHILS ABSOLUTE COUNT: 0.1 10*9/L (ref 0.0–0.5)
EOSINOPHILS RELATIVE PERCENT: 2.2 %
HEMATOCRIT: 39.2 % (ref 39.0–48.0)
HEMOGLOBIN: 12.8 g/dL — ABNORMAL LOW (ref 12.9–16.5)
LYMPHOCYTES ABSOLUTE COUNT: 2.3 10*9/L (ref 1.1–3.6)
LYMPHOCYTES RELATIVE PERCENT: 36.6 %
MEAN CORPUSCULAR HEMOGLOBIN CONC: 32.7 g/dL (ref 32.0–36.0)
MEAN CORPUSCULAR HEMOGLOBIN: 27 pg (ref 25.9–32.4)
MEAN CORPUSCULAR VOLUME: 82.6 fL (ref 77.6–95.7)
MEAN PLATELET VOLUME: 8 fL (ref 6.8–10.7)
MONOCYTES ABSOLUTE COUNT: 0.5 10*9/L (ref 0.3–0.8)
MONOCYTES RELATIVE PERCENT: 7.4 %
NEUTROPHILS ABSOLUTE COUNT: 3.3 10*9/L (ref 1.8–7.8)
NEUTROPHILS RELATIVE PERCENT: 53.3 %
PLATELET COUNT: 284 10*9/L (ref 150–450)
RED BLOOD CELL COUNT: 4.75 10*12/L (ref 4.26–5.60)
RED CELL DISTRIBUTION WIDTH: 16.7 % — ABNORMAL HIGH (ref 12.2–15.2)
WBC ADJUSTED: 6.2 10*9/L (ref 3.6–11.2)

## 2024-03-30 LAB — LYMPH MARKER LIMITED,FLOW
ABSOLUTE CD3 CNT: 2024 {cells}/uL (ref 915–3400)
ABSOLUTE CD4 CNT: 690 {cells}/uL (ref 510–2320)
ABSOLUTE CD8 CNT: 1288 {cells}/uL (ref 180–1520)
CD3% (T CELLS): 88 % — ABNORMAL HIGH (ref 61–86)
CD4% (T HELPER): 30 % — ABNORMAL LOW (ref 34–58)
CD4:CD8 RATIO: 0.5 — ABNORMAL LOW (ref 0.9–4.8)
CD8% T SUPPRESR: 56 % — ABNORMAL HIGH (ref 12–38)

## 2024-03-30 LAB — COMPREHENSIVE METABOLIC PANEL
ALBUMIN: 3.6 g/dL (ref 3.4–5.0)
ALKALINE PHOSPHATASE: 86 U/L (ref 46–116)
ALT (SGPT): 18 U/L (ref 10–49)
ANION GAP: 11 mmol/L (ref 5–14)
AST (SGOT): 27 U/L (ref <=34)
BILIRUBIN TOTAL: 0.3 mg/dL (ref 0.3–1.2)
BLOOD UREA NITROGEN: 10 mg/dL (ref 9–23)
BUN / CREAT RATIO: 8
CALCIUM: 9.4 mg/dL (ref 8.7–10.4)
CHLORIDE: 108 mmol/L — ABNORMAL HIGH (ref 98–107)
CO2: 22 mmol/L (ref 20.0–31.0)
CREATININE: 1.21 mg/dL — ABNORMAL HIGH (ref 0.73–1.18)
EGFR CKD-EPI (2021) MALE: 68 mL/min/1.73m2 (ref >=60)
GLUCOSE RANDOM: 122 mg/dL (ref 70–179)
POTASSIUM: 3.7 mmol/L (ref 3.4–4.8)
PROTEIN TOTAL: 8.9 g/dL — ABNORMAL HIGH (ref 5.7–8.2)
SODIUM: 141 mmol/L (ref 135–145)

## 2024-03-30 LAB — HIGH SENSITIVITY TROPONIN I - 2 HOUR SERIAL
HIGH SENSITIVITY TROPONIN - DELTA (0-2H): 0 ng/L (ref <=7)
HIGH-SENSITIVITY TROPONIN I - 2 HOUR: 10 ng/L (ref <=53)

## 2024-03-30 LAB — MAGNESIUM: MAGNESIUM: 1.8 mg/dL (ref 1.6–2.6)

## 2024-03-30 LAB — HIGH SENSITIVITY TROPONIN I - SERIAL: HIGH SENSITIVITY TROPONIN I: 10 ng/L (ref <=53)

## 2024-03-30 MED ADMIN — atorvastatin (LIPITOR) tablet 40 mg: 40 mg | ORAL | @ 14:00:00

## 2024-03-30 MED ADMIN — insulin lispro (HumaLOG) injection 0-20 Units: 0-20 [IU] | SUBCUTANEOUS | @ 15:00:00

## 2024-03-30 MED ADMIN — bictegrav-emtricit-tenofov ala (BIKTARVY) 50-200-25 mg tablet 1 tablet: 1 | ORAL | @ 15:00:00

## 2024-03-30 MED ADMIN — levETIRAcetam (KEPPRA) injection 2,250 mg: 2250 mg | INTRAVENOUS | @ 19:00:00 | Stop: 2024-03-30

## 2024-03-30 MED ADMIN — empagliflozin (JARDIANCE) tablet 25 mg: 25 mg | ORAL | @ 14:00:00

## 2024-03-30 MED ADMIN — acetaminophen (TYLENOL) tablet 1,000 mg: 1000 mg | ORAL | @ 07:00:00 | Stop: 2024-03-30

## 2024-03-30 MED ADMIN — diazePAM (VALIUM) injection 5 mg: 5 mg | INTRAVENOUS | @ 13:00:00 | Stop: 2024-03-30

## 2024-03-30 MED ADMIN — sertraline (ZOLOFT) tablet 50 mg: 50 mg | ORAL | @ 14:00:00

## 2024-03-30 MED ADMIN — cyclobenzaprine (FLEXERIL) tablet 10 mg: 10 mg | ORAL | @ 07:00:00 | Stop: 2024-03-30

## 2024-03-30 MED ADMIN — pantoprazole (Protonix) EC tablet 40 mg: 40 mg | ORAL | @ 14:00:00

## 2024-03-30 MED ADMIN — metoPROLOL succinate (Toprol-XL) 24 hr tablet 25 mg: 25 mg | ORAL | @ 14:00:00

## 2024-03-30 MED ADMIN — furosemide (LASIX) tablet 20 mg: 20 mg | ORAL | @ 14:00:00

## 2024-03-30 MED ADMIN — lacosamide (VIMPAT) injection 300 mg: 300 mg | INTRAVENOUS | @ 23:00:00 | Stop: 2024-03-30

## 2024-03-30 MED ADMIN — lisinopril (PRINIVIL,ZESTRIL) tablet 10 mg: 10 mg | ORAL | @ 14:00:00

## 2024-03-30 MED ADMIN — levETIRAcetam (KEPPRA) injection 2,350 mg: 30 mg/kg | INTRAVENOUS | @ 12:00:00 | Stop: 2024-03-30

## 2024-03-30 MED ADMIN — levETIRAcetam (KEPPRA) injection 1,000 mg: 1000 mg | INTRAVENOUS | @ 14:00:00 | Stop: 2024-03-30

## 2024-03-30 MED ADMIN — gabapentin (NEURONTIN) capsule 200 mg: 200 mg | ORAL | @ 07:00:00 | Stop: 2024-03-30

## 2024-03-30 MED ADMIN — ketorolac (TORADOL) injection 15 mg: 15 mg | INTRAVENOUS | @ 07:00:00 | Stop: 2024-03-30

## 2024-03-30 NOTE — Unmapped (Addendum)
 DAILY REAL TIME INTERIM LONGTERM VIDEO EEG MONITORING NOTE    Identifying Information   NAME: Ryan Davenport    MRN: [id number]   DOB: [birth year]      LOC: 66-D/66-D    HISTORY: 63 y.o. male with new onset of episodes of pain and shaking of the right arm     INDICATION:  Seizures     PATIENT STATE: Awake, Drowsy, and Sleep     Study Information  EEG Start:   March 30, 2024 at 1055  EEG End: March 30, 2024  at     [facility] 4/7 at 1055 to 4/7 at 2100    EEG TECHNICAL DESCRIPTION   Conditions of Recording:  Continuous EEG with simultaneous video recording was performed utilizing 21 active electrodes placed according to the international 10-20 system.  The study was recorded digitally with a bandpass of 1-70Hz  and a sampling rate of 200Hz  and was reviewed with the possibility of multiple reformatting.  The study was digitally processed with potential spike and seizure events identified for physician analysis and review.  Patient recognized events were identified by a push button marker and reviewed by the physician.   Simultaneous video was reviewed for all patient events.    EEG DESCRIPTION:       Initial Background Activity (see Daily Interpretation below for description of background changes that occur as the recording progresses)     Frequency (maximal in waking or arousal):  During waking there is 9 Hz posterior dominant alpha activity with appropriate organization and anterior-posterior voltage and frequency gradients.        PDR:  Present    Asymmetry/ Focal slowing: No    Amplitude: Normal (20 - 150 uV)    Continuity:  Continuous     Sleep Activity/ State Change: State change present with normal N2 sleep (sleep spindles and vertex waves)    Reactivity: Present     Periodic/ Rhythmic Patterns/ Epileptiform Activity:  No.    Seizures: Yes. There were frequent electroclinical seizures starting over the left parasagittal region and consisting of rhythmic beta activity evolving to slower frequency low amplitude spike discharges, duration 40 seconds.    Clinically the patient cried out and there is tonic flexion of the RUE followed by rapid clonic movements.  Initially seizures were 3-4 per hour, decreasing to about 1 per hour after 1500 on 4/7      Patient Events: No.    DAY 1:  EEG INTERPRETATION:  March 30, 2024 at  1055  to March 30, 2024 at [facility])     Relevant Medications: Keppra, Vimpat 300 mg load at 1844 on 4/7    Abnormal due to:     There were frequent electroclinical seizures starting over the left parasagittal region and consisting of rhythmic beta activity evolving to slower frequency low amplitude spike discharges, duration 40-60 seconds.  Clinically the patient cried out and there is tonic flexion of the RUE followed by rapid clonic movements.   Initially 3-4 per hour decreasing to about 1 per hour after 1500      Disconnected for transfer 1730 to 1820     CLINICAL CORRELATION/ SUMMARY     These findings are consistent with frequent focal electroclinical seizures arising from the left parasagittal region with clinical correlate of right arm flexion and clonic activity.         EEG monitoring continues with a new EEG interpreting physician      Interpreting Attending: Ladajah Soltys M Laker Thompson, MD  2HELPS2B Seizure Risk Score  Clinical risk score based on EEG findings and clinical history of seizures to aid in determination of optimal duration of EEG monitoring for detection of electrographic seizures.  Score has not been validated in patients under age 71 or following cardiac arrest.    NOT APPLICABLE FOR THIS PATIENT     Add 1 point if known history of epilepsy or prior clinical seizure.      Risk Group     Seizure Risk   at 72 hours   Duration of Monitoring    Seizure risk < 5%     Duration of Monitoring    Seizure risk < 2%       Low Risk,   Score = 0     3.1%   1 Hour   3.3 Hours     Medium Risk,   Score = 1     12%   12 Hours   29 Hours     High Risk,   Score = 2 or greater >25%   >24 Hours   >30 Hours   Struck et al JAMA Neurology, January 2020    2HELPS2B Seizure Risk Score is not appropriate when EEG monitoring is being performed for the following indications:  Treatment of status epilepticus, seizures already documented on EEG or Ictal Interictal continuum pattern (IIC)  Monitoring sedation/ burst suppression for management of intracranial pressure and/or paralyzed patients  Diagnostic evaluation of transient episodes concerning for possible seizures (spell capture)   Patients s/p cardiac arrest undergoing targeted temperature management      Cherrie Cornwall al. American Clinical Neurophysiology Society's Standardized Critical Care EEG Terminology: 2021 Version. Journal of Clinical Neurophysiology 38(1):p 1-29, January 2021.   ACNS STANDARDIZED ICU EEG NOMENCLATURE v ([email or web address])

## 2024-03-30 NOTE — Unmapped (Signed)
 Patient states last Tuesday his sugar was over 1,000 and he was treated in [place]. He said at this time his left arm became painful and difficult to move, which is his baseline but it is much worse than usual. He says his arm is bothering him and he can't sleep and he believes his sugar may be high again.

## 2024-03-30 NOTE — Unmapped (Signed)
 NMB (Neurology Team B)  Admission   History and Physical     Patient: Ryan Davenport  Code Status: Full Code.  Level of Care: Acute floor status.   LOS: 0 days      Assessment and Plan   Contact Information:  Family contact: Augustine Leverette (Mother, [phone])  PCP: Consuelo Denmark, PA    Assessment: Ryan Davenport is a 63 y.o. 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     #Left parasagittal focal seizures  Initial presentation with 1 week of right upper extremity and facial tonic followed by clonic activity lasting less than 1 minute, though increasing in frequency up to 15 events per day. Now s/p Keppra load 30 mg/kg x2. With EEG showing left parasagittal focal seizures, constitutes left focal seizures correlating with aforementioned semiology, though etiology remains unclear.  With patient's history of HIV (previously AIDS, though CD4 count recovered to 694/7), and history of classically immunocompromised infections (CMV retinitis, molluscum contagiosum, secondary syphilis), could raise suspicion for new CNS infectious etiology.  However, MRI brain without contrast not showing any focal lesions or bleeds to explain the current symptoms and no systemic signs to explain meningitis or infectious encephalitis.  No headaches, strokes, bleeds on MRI making CVST significantly less likely.  Nonetheless, plan to prepare for lumbar puncture and consider repeat MRI brain with contrast.  - S/p Keppra 30 mg/kg X2, continue 1 g twice daily  - If continue seizure-like activity consider Vimpat as second line agent  - Consider repeat MRI brain with and without contrast  - Consider lumbar puncture given history HIV and new focal seizures    #Hx DVT/PE  Chart history of DVT PE 2012, on long-term anticoagulation as per hematology.  However, will be holding anticoagulation for consideration of LP as above.  Will also need confirmation as to Eliquis versus Xarelto use as fill history indicating transition to Xarelto.  - Holding anticoagulation given LP as above  - Plan to to restart anticoagulation (likely Xarelto, but will need to confirm fill history)    #T2DM  History of poorly controlled type 2 diabetes with A1c 12 (increased from 6.4% 3 months prior).  Initial presentation with hyperglycemia to outside hospital, 4/5 to 500s, subsequently improved.  On 12 units Lantus nightly, recently restarted 4/1, and 5 units lispro 3 times daily AC.  Plan to restart Lantus as previously with aggressive sliding scale and up titration of mealtime pending sliding requirements.  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  Medical history complicated by multiple infections including right CMV retinitis, molluscum contagiosum, and secondary syphilis, all status post treatment.  On Biktarvy at home.  Plan to continue as below.  - Continue Biktarvy daily    #Depression  -Continue home sertraline 50 mg daily    #CAD s/p stent 2021 #HLD  History of CAD ISO HLD s/p stent 2021, though not previously on aspirin at home.  Per wife, not taking an aspirin.  -Consider inpatient vs outpatient cardiology follow up to determine if needing ASA  -Restart home atorvastatin 40 mg daily, Metoprolol 25 mg qd    #HTN  -  Lisinopril 10 mg every day, Lasix 20 qd    #GERD  -Start pharmacy equivalent pantoprazole 40 mg daily (home omeprazole 40 mg daily)    #Hx Gout  -Hold home colchicine as needed    Discharge Planning:   - Case management: N/A  - Social work: N/A  - PT: consulted, TBD  - OT: consulted, TBD  - SLP: N/A  - Expected Discharge Disposition: TBD.      Checklist:  - Diet: Regular diet  - IV fluids: no  - Bowel Regimen: No indication for a bowel regimen at this time (reason: PRN)  - GI PPX: No GI indications  - DVT PPX:  Holding given consideration for LP  - Lines/Access: PIV x1.    - Foley: No    This patient was seen and discussed with Dr. Parker Bollard, who agrees with the above assessment and plan.      Please page Neurology Team B at [phone] for any questions/concerns.    Maryland Snow, MD  Neurology  PGY-2 Resident       HPI     Chief Complaint: Seizure-like activity    HPI: Ryan Davenport is a 63 y.o. male with a past medical history of HIV, who presents with RUE tonic and clonic activity.    Per patient since 3/31 he has had episodic pain in his RUE. He states the pain is sharp as if someone is stabbing him throughout his arm, this was followed by numbness and tingling in his hand. Ultimately these symptoms lead to 'muscle spasms' in his RUE followed by weakness that ultimately improves. Denies any infectious signs or having these episodes previously. Does state he has a seizure history of GTCs with tongue bites and loss of consciousness in 2013, was never on any ASM. Denies any face or leg involvement with these episodes or loss of consciousness.      However, this note writer observed one of the aforementioned episodes.  Semiology is noted to be right upper extremity feeling of numbness starting at wrist and ascending up arm followed by pain and then tonic contraction of right upper extremity.  While left upper extremity also seems to tonically contracted, is volitional as left arm is reaching for right arm given degree of pain.  This note writer is able to get patient to lower left arm, though right arm remains very high tone.  After approximately 30 seconds of aura and 15 seconds of tonic activity, clonic activity observed lasting for approximately 45 seconds to 1 minute in the right arm with evidence of right face twisting.  After activity abates, patient is significantly weaker in right upper extremity, though denying any new facial droop or slurring of words.  He is also more lethargic compared to prior event.    Past Medical History:   Diagnosis Date    Asthma     Asymptomatic human immunodeficiency virus (HIV) infection status 04/09/2013    Diagnosed in 2005 with a CD4 of 32 and on at least one prior regimen on a study, then switched to Truvada with ritonavir boosted atazanavir. Changed to Truvada with boosted darunavir secondary to reflux symptoms. Has remained undetectable for several years with CD4 count greater than 500.      CAD (coronary artery disease)     Depression     Diabetes mellitus     DVT (deep venous thrombosis)     Eczema     Enlargement of liver     GERD (gastroesophageal reflux  disease)     HIV (human immunodeficiency virus infection)     Hyperlipidemia     Hypertension     Myocardial infarction     Obesity     Pulmonary embolism 2012    Sleep apnea      Past Surgical History:  Past Surgical History:   Procedure Laterality Date    PR COLONOSCOPY FLX DX W/COLLJ SPEC WHEN PFRMD N/A 11/11/2013    Procedure: COLONOSCOPY, FLEXIBLE, PROXIMAL TO SPLENIC FLEXURE; DIAGNOSTIC, W/WO COLLECTION SPECIMEN BY BRUSH OR WASH;  Surgeon: Elfrieda Grise, MD;  Location: [facility];  Service: Gastroenterology    PR UPPER GI ENDOSCOPY,DIAGNOSIS N/A 04/01/2019    Procedure: UGI ENDO, INCLUDE ESOPHAGUS, STOMACH, & DUODENUM &/OR JEJUNUM; DX W/WO COLLECTION SPECIMN, BY BRUSH OR WASH;  Surgeon: Dorthy Gavia, MD;  Location: [facility];  Service: Gastroenterology    SKIN BIOPSY       Social History:  Social History     Socioeconomic History    Marital status: Single   Tobacco Use    Smoking status: Never     Passive exposure: Never    Smokeless tobacco: Never   Vaping Use    Vaping status: Never Used   Substance and Sexual Activity    Alcohol use: No     Alcohol/week: 0.0 standard drinks of alcohol    Drug use: No    Sexual activity: Not Currently     Partners: Male   Other Topics Concern    Do you use sunscreen? No    Tanning bed use? No    Are you easily burned? No    Excessive sun exposure? No    Blistering sunburns? No   Social History Narrative    Single.    No children    Works 2 days per week.  [employer] in [employer]     Social Drivers of Health     Financial Resource Strain: Low Risk  (07/31/2023)    Overall Financial Resource Strain (CARDIA)     Difficulty of Paying Living Expenses: Not hard at all   Food Insecurity: No Food Insecurity (03/27/2024)    Received from [facility]    Hunger Vital Sign     Worried About Running Out of Food in the Last Year: Never true     Ran Out of Food in the Last Year: Never true   Transportation Needs: Unknown (03/27/2024)    Received from [facility] - Transportation     Lack of Transportation (Medical): No     Lack of Transportation (Non-Medical): Patient unable to answer   Social Connections: Unknown (03/27/2024)    [facility] from [facility]    Social Connection and Isolation Panel [NHANES]     Frequency of Communication with Friends and Family: Patient declined     Frequency of Social Gatherings with Friends and Family: More than three times a week     Attends Religious Services: 1 to 4 times per year     Active Member of [employer] or Organizations: Patient declined     Attends Banker Meetings: Patient declined     Marital Status: Never married   Housing: Low Risk  (03/09/2024)    Housing     Within the past 12 months, have you ever stayed: outside, in a car, in a tent, in an overnight shelter, or temporarily in someone else's home (i.e. couch-surfing)?: No     Are you  worried about losing your housing?: No     Family History:  Family History   Problem Relation Age of Onset    Diabetes Mother     Heart disease Mother     Hypertension Mother     Arthritis Mother     Aneurysm Father     Skin cancer Neg Hx     Melanoma Neg Hx     Basal cell carcinoma Neg Hx     Squamous cell carcinoma Neg Hx      Home Medications:  (Not in a hospital admission)    Medications Administered at Hospital:  Current Facility-Administered Medications   Medication Dose Route Frequency Provider Last Rate Last Admin    acetaminophen (TYLENOL) tablet 650 mg  650 mg Oral Q6H PRN Meyers, Katherine J, MD        [Provider Hold] apixaban (ELIQUIS) tablet 5 mg  5 mg Oral BID Crystalynn Mcinerney D, MD        atorvastatin (LIPITOR) tablet 40 mg  40 mg Oral Daily Deagen Krass D, MD   40 mg at 03/30/24 1022    bictegrav-emtricit-tenofov ala (BIKTARVY) 50-200-25 mg tablet 1 tablet  1 tablet Oral Daily Kada Friesen D, MD   1 tablet at 03/30/24 1110    calcium carbonate (TUMS) chewable tablet 200 mg elem calcium  200 mg elem calcium Oral TID PRN Meyers, Katherine J, MD        cyclobenzaprine (FLEXERIL) tablet 5 mg  5 mg Oral TID PRN Meyers, Katherine J, MD        dextrose 50 % in water (D50W) 50 % solution 12.5 g  12.5 g Intravenous Q10 Min PRN Aahan Marques D, MD        empagliflozin (JARDIANCE) tablet 25 mg  25 mg Oral Daily Liisa Picone D, MD   25 mg at 03/30/24 1022    furosemide (LASIX) tablet 20 mg  20 mg Oral Daily Shamel Galyean D, MD   20 mg at 03/30/24 1022    glucagon injection 1 mg  1 mg Intramuscular Once PRN Nilza Eaker D, MD        glucose chewable tablet 16 g  16 g Oral Q10 Min PRN Albaraa Swingle D, MD        hydrOXYzine (ATARAX) tablet 25 mg  25 mg Oral Nightly PRN Meyers, Katherine J, MD        insulin glargine (LANTUS) injection 12 Units  12 Units Subcutaneous Nightly Dala Breault D, MD        insulin lispro (HumaLOG) injection 0-20 Units  0-20 Units Subcutaneous ACHS Maryland Snow D, MD   1 Units at 03/30/24 1109    levETIRAcetam (KEPPRA) injection 1,000 mg  1,000 mg Intravenous Q12H SCH Jenessa Gillingham D, MD   1,000 mg at 03/30/24 1023    lidocaine (ASPERCREME) 4 % 1 patch  1 patch Transdermal Daily PRN Meyers, Katherine J, MD        lisinopril (PRINIVIL,ZESTRIL) tablet 10 mg  10 mg Oral Daily Shephanie Romas D, MD   10 mg at 03/30/24 1023    methocarbamol (ROBAXIN) tablet 750 mg  750 mg Oral QID PRN Meyers, Katherine J, MD        metoPROLOL succinate (Toprol-XL) 24 hr tablet 25 mg  25 mg Oral Daily Sible Straley D, MD   25 mg at 03/30/24 1023    pantoprazole (Protonix) EC tablet 40 mg  40 mg Oral Daily before breakfast Odessia Benedict, MD   40  mg at 03/30/24 1023    [START ON 03/31/2024] polyethylene glycol (MIRALAX) packet 17 g  17 g Oral Daily Meyers, Katherine J, MD        senna [facility]) tablet 1 tablet  1 tablet Oral Nightly Meyers, Katherine J, MD        sertraline (ZOLOFT) tablet 50 mg  50 mg Oral Daily Ashante Snelling D, MD   50 mg at 03/30/24 1023     Current Outpatient Medications   Medication Sig Dispense Refill    atorvastatin (LIPITOR) 40 MG tablet Take 1 tablet (40 mg total) by mouth daily. 90 tablet 3    betamethasone dipropionate 0.05 % cream Apply 1 Application topically two (2) times a day.      BIKTARVY 50-200-25 mg tablet Take 1 tablet by mouth daily. 90 tablet 3    blood sugar diagnostic (GLUCOSE BLOOD) Strp Use to check blood sugar as directed with insulin 3 times a day & for symptoms of high or low blood sugar. 100 strip 0    blood-glucose meter kit Use as instructed 1 each 0    cetirizine (ZYRTEC) 10 MG tablet Take 1 tablet (10 mg total) by mouth two (2) times a day. 60 tablet 11    colchicine (COLCRYS) 0.6 mg tablet TAKE 1 TABLET BY MOUTH DAILY AS NEEDED FOR GOUT 90 tablet 3    dupilumab (DUPIXENT SYRINGE) 300 mg/2 mL Syrg injection Inject 2 mL (300 mg total) under the skin every fourteen (14) days. For maintenance dose 4 mL 11    dupilumab (DUPIXENT SYRINGE) 300 mg/2 mL Syrg injection Inject the contents of 2 pens (600 mg) under the skin once for loading dose 4 mL 0    ELIQUIS 5 mg Tab Take 1 tablet (5 mg total) by mouth two (2) times a day.      empagliflozin (JARDIANCE) 25 mg tablet Take 1 tablet (25 mg total) by mouth daily. 90 tablet 3    furosemide (LASIX) 20 MG tablet Take 1 tablet (20 mg total) by mouth daily. gabapentin (NEURONTIN) 100 MG capsule Take 1 capsule (100 mg total) by mouth Three (3) times a day. 90 capsule 0    hydrocortisone 2.5 % ointment Apply 1 Application topically two (2) times a day. To active areas of the face until smooth 454 g 5    hydrOXYzine (ATARAX) 25 MG tablet Take 1 tablet (25 mg total) by mouth every six (6) hours as needed.      insulin aspart (NOVOLOG FLEXPEN) 100 unit/mL (3 mL) injection pen Inject 0.05 mL (5 Units total) under the skin Three (3) times a day before meals. 4.5 mL 0    insulin glargine (BASAGLAR, LANTUS) 100 unit/mL (3 mL) injection pen Inject 0.12 mL (12 Units total) under the skin nightly. 3.6 mL 0    lancets Misc Use to check blood sugar as directed with insulin 3 times a day & for symptoms of high or low blood sugar. 100 each 0    lisinopril (PRINIVIL,ZESTRIL) 10 MG tablet Take 1 tablet (10 mg total) by mouth daily. 30 tablet 0    methocarbamoL (ROBAXIN) 750 MG tablet 750mg  4x daily prn muscle spasms 60 tablet 2    metoprolol succinate (TOPROL-XL) 25 MG 24 hr tablet Take 1 tablet (25 mg total) by mouth daily. 90 tablet 3    omeprazole (PRILOSEC) 40 MG capsule 40mg  BID 180 capsule 3    pen needle, diabetic 32 gauge x 5/32 (4 mm) Ndle Use with insulin  up to 4 times/day as needed. 1 each 0    [Paused] semaglutide (RYBELSUS) 14 mg Tab Take 14 mg by mouth daily. 30 tablet 11    sertraline (ZOLOFT) 50 MG tablet Take 1 tablet (50 mg total) by mouth daily. 30 tablet 0    triamcinolone (KENALOG) 0.1 % ointment Apply topically two (2) times a day. To active areas of trunk and extremities until smooth 454 g 5       Allergies:  Allergies   Allergen Reactions    Latex Rash    Shellfish Containing Products      Other reaction(s): SHORTNESS OF BREATH    Metformin Nausea Only     Both IR and XR formulations    Both IR and XR formulations   Both IR and XR formulations    Both IR and XR formulations      Both IR and XR formulations Both IR and XR formulations    Penicillins Rash Review of Systems   A 12 system review of systems was negative except as noted in HPI.     Physical Exam     Physical Exam:  General Appearance: in no acute distress, well appearing,, and alert,. Normal skin color, afebrile.  HEENT: Head is atraumatic and normocephalic. Sclera anicteric without injection. Oropharyngeal membranes are moist with no erythema or exudate.  Neck: Grossly normal range of motion.  Lungs: Normal work of breathing.  Heart: warm, well perfused.  Abdomen: Soft, nontender, nondistended.  Extremities: No clubbing, cyanosis, or edema.     NEUROLOGICAL EXAMINATION:     General/Mental Status:    Alert, conversant, able to follow conversation and interview.     Comprehension was intact.   Attention span and concentration were diminished.    Spontaneous speech was with mild dysarthria.      Cranial Nerves:   II, III - Pupils are equal and reactive to light b/l (direct and consensual reactions). No visual field defects.   III, IV, VI - Pursuit eye movements were uninterrupted with full range and without more than end-gaze nystagmus. Extra ocular movements are intact, no ptosis, no diplopia, no nystagmus.  V - Sensation of the face intact b/l to light touch in all three divisions of CNV.   VII - Face symmetrical at rest, no facial droop. Normal facial movements bilaterally including forehead, eye closure, and smile/grimace.  VIII - Hearing grossly intact to conversation.   IX and X - Palate movement is symmetric, no dysarthria.  XI - Full shoulder shrug bilaterally; normal tone and strength of sternocleidomastoid muscles bilaterally.  XII - No tongue atrophy, no tongue fasciculations; tongue protrudes midline, full range of movements of the tongue.    Motor:   Normal bulk. No tremors, myoclonus, or other adventitious movement.  Pronator drift is absent. Fasciculations not observed.     Muscle strength:  LUE 5/5  RUE 1/5 at deltoid, bicep, tricep. 2/5 at [facility], ECR, and hand grip.  BLE 5/5.    Reflexes: Deferred    Sensory:   UEs LEs    R L R L   Light touch WNL WNL WNL WNL     Cerebellar/Coordination/Gait:  Deferred     Diagnostic Studies      Temp:  [37.1 ??C (98.8 ??F)] 37.1 ??C (98.8 ??F)  Heart Rate:  [81-101] 81  SpO2 Pulse:  [81] 81  Resp:  [15-20] 15  BP: (118-179)/(73-102) 143/92  MAP (mmHg):  [87-123] 108  SpO2:  [96 %-100 %] 98 %  No intake/output data recorded.    All Labs Last 24hrs:   Recent Results (from the past 24 hours)   ECG 12 Lead    Collection Time: 03/30/24 12:53 AM   Result Value Ref Range    EKG Systolic BP  mmHg    EKG Diastolic BP  mmHg    EKG Ventricular Rate 101 BPM    EKG Atrial Rate 101 BPM    EKG P-R Interval 156 ms    EKG QRS Duration 96 ms    EKG Q-T Interval 362 ms    EKG QTC Calculation 469 ms    EKG Calculated P Axis 47 degrees    EKG Calculated R Axis -5 degrees    EKG Calculated T Axis 42 degrees    QTC Fredericia 430 ms   hsTroponin I (serial 0-2-6H w/ delta)    Collection Time: 03/30/24  1:11 AM   Result Value Ref Range    hsTroponin I 10 <=53 ng/L   Comprehensive Metabolic Panel    Collection Time: 03/30/24  1:11 AM   Result Value Ref Range    Sodium 141 135 - 145 mmol/L    Potassium 3.7 3.4 - 4.8 mmol/L    Chloride 108 (H) 98 - 107 mmol/L    CO2 22.0 20.0 - 31.0 mmol/L    Anion Gap 11 5 - 14 mmol/L    BUN 10 9 - 23 mg/dL    Creatinine 0.86 (H) 0.73 - 1.18 mg/dL    BUN/Creatinine Ratio 8     eGFR CKD-EPI (2021) Male 68 >=60 mL/min/1.14m2    Glucose 122 70 - 179 mg/dL    Calcium 9.4 8.7 - 57.8 mg/dL    Albumin 3.6 3.4 - 5.0 g/dL    Total Protein 8.9 (H) 5.7 - 8.2 g/dL    Total Bilirubin 0.3 0.3 - 1.2 mg/dL    AST 27 <=46 U/L    ALT 18 10 - 49 U/L    Alkaline Phosphatase 86 46 - 116 U/L   Magnesium Level    Collection Time: 03/30/24  1:11 AM   Result Value Ref Range    Magnesium 1.8 1.6 - 2.6 mg/dL   Blood Gas, Venous    Collection Time: 03/30/24  1:11 AM   Result Value Ref Range    Specimen Source Venous     FIO2 Venous Not Specified     pH, Venous 7.35 7.32 - 7.43    pCO2, Ven 42 40 - 60 mm Hg    pO2, Ven 56 (H) 30 - 55 mm Hg    HCO3, Ven 23 22 - 27 mmol/L    Base Excess, Ven -2.7 (L) -2.0 - 2.0    O2 Saturation, Venous 87.0 (H) 40.0 - 85.0 %   CBC w/ Differential    Collection Time: 03/30/24  1:11 AM   Result Value Ref Range    WBC 6.2 3.6 - 11.2 10*9/L    RBC 4.75 4.26 - 5.60 10*12/L    HGB 12.8 (L) 12.9 - 16.5 g/dL    HCT 96.2 95.2 - 84.1 %    MCV 82.6 77.6 - 95.7 fL    MCH 27.0 25.9 - 32.4 pg    MCHC 32.7 32.0 - 36.0 g/dL    RDW 32.4 (H) 40.1 - 15.2 %    MPV 8.0 6.8 - 10.7 fL    Platelet 284 150 - 450 10*9/L    Neutrophils % 53.3 %    Lymphocytes % 36.6 %  Monocytes % 7.4 %    Eosinophils % 2.2 %    Basophils % 0.5 %    Absolute Neutrophils 3.3 1.8 - 7.8 10*9/L    Absolute Lymphocytes 2.3 1.1 - 3.6 10*9/L    Absolute Monocytes 0.5 0.3 - 0.8 10*9/L    Absolute Eosinophils 0.1 0.0 - 0.5 10*9/L    Absolute Basophils 0.0 0.0 - 0.1 10*9/L    Anisocytosis Slight (A) Not Present   POCT Glucose    Collection Time: 03/30/24  1:18 AM   Result Value Ref Range    Glucose, POC 117 70 - 179 mg/dL   hsTroponin I - 2 Hour    Collection Time: 03/30/24  2:57 AM   Result Value Ref Range    hsTroponin I 10 <=53 ng/L    delta hsTroponin I 0 <=7 ng/L   POCT Glucose    Collection Time: 03/30/24 10:33 AM   Result Value Ref Range    Glucose, POC 160 70 - 179 mg/dL       Images personally reviewed and discussed in the Plan, above.

## 2024-03-30 NOTE — Unmapped (Signed)
 Initial Consult Note        Requesting Attending Physician:  Sol Duos, MD  Service Requesting Consult: Emergency Medicine     Assessment and Plan          Ryan Davenport is a 63 y.o.  male on whom I have been asked by Sol Duos, MD to consult for muscle spasms and pain with jerking in R arm.     #Right arm jerking c/f for focal seizure w/ Todds  Patient w/ hx of uncontrolled T2DM, PE on eliquis, HIV, HTN who has had a week of episodic RUE pain, numbness and tingling leading to 'arm spasms'. Spasms were witnessed on exam and appeared to be focal seizures with jerking in his right arm which was followed by paralysis that improved within 10-15 minutes. Episodes are preceded by stabbing arm pain followed by numbness than jerking. He has recently been having these every 10-15 minutes per patient. Denies any face or leg involvement. He notes these episodes worsened iso of hyperglycemia.  MRI c-spine with no acute pathology. Presentation is concerning for a focal seizure. Patient notes previous history of GTC's in 2013, but has never been on ASM.  Warrants further work-up with MRI    Recommendations:   Please obtain MRI brain wwo  Please load keppra 30 mg/kg, then start 1g of keppra in the afternoon  We will work on getting a patient an EEG while he is here    This patient was discussed with Dr. Alain Aliment, senior resident, who agrees to the assessment and plan. Dr. Parker Bollard was available.     Jim Motts, MD  PGY-2, Neurology         HPI        Reason for Consult: spasms in arm    Ryan Davenport is a 63 y.o.  male on whom I have been asked by Sol Duos, MD to consult for right arm spasms.    Per patient since 3/31 he has had episodic pain in his RUE. He states the pain is sharp as if someone is stabbing him throughout his arm, this was followed by numbness and tingling in his hand. Ultimately these symptoms lead to 'muscle spasms' in his RUE followed by weakness that ultimately improves. Denies any infectious signs or having these episodes previously. Does state he has a seizure history of GTCs with tongue bites and loss of consciousness in 2013, was never on any ASM. Denies any face or leg involvement with these episodes or loss of consciousness.          Allergies   Allergen Reactions    Latex Rash    Shellfish Containing Products      Other reaction(s): SHORTNESS OF BREATH    Metformin Nausea Only     Both IR and XR formulations    Both IR and XR formulations   Both IR and XR formulations    Both IR and XR formulations      Both IR and XR formulations Both IR and XR formulations    Penicillins Rash        Past Medical History:   Diagnosis Date    Asthma     Asymptomatic human immunodeficiency virus (HIV) infection status 04/09/2013    Diagnosed in 2005 with a CD4 of 32 and on at least one prior regimen on a study, then switched to Truvada with ritonavir boosted atazanavir. Changed to Truvada with boosted darunavir secondary to reflux symptoms. Has remained undetectable  for several years with CD4 count greater than 500.      CAD (coronary artery disease)     Depression     Diabetes mellitus     DVT (deep venous thrombosis)     Eczema     Enlargement of liver     GERD (gastroesophageal reflux disease)     HIV (human immunodeficiency virus infection)     Hyperlipidemia     Hypertension     Myocardial infarction     Obesity     Pulmonary embolism 2012    Sleep apnea        Past Surgical History:   Procedure Laterality Date    PR COLONOSCOPY FLX DX W/COLLJ SPEC WHEN PFRMD N/A 11/11/2013    Procedure: COLONOSCOPY, FLEXIBLE, PROXIMAL TO SPLENIC FLEXURE; DIAGNOSTIC, W/WO COLLECTION SPECIMEN BY BRUSH OR WASH;  Surgeon: Elfrieda Grise, MD;  [facility];  Service: Gastroenterology    PR UPPER GI ENDOSCOPY,DIAGNOSIS N/A 04/01/2019    Procedure: UGI ENDO, INCLUDE ESOPHAGUS, STOMACH, & DUODENUM &/OR JEJUNUM; DX W/WO COLLECTION SPECIMN, BY BRUSH OR WASH;  Surgeon: Dorthy Gavia, MD; Location: [facility];  Service: Gastroenterology    SKIN BIOPSY         Social History     Socioeconomic History    Marital status: Single   Tobacco Use    Smoking status: Never     Passive exposure: Never    Smokeless tobacco: Never   Vaping Use    Vaping status: Never Used   Substance and Sexual Activity    Alcohol use: No     Alcohol/week: 0.0 standard drinks of alcohol    Drug use: No    Sexual activity: Not Currently     Partners: Male   Other Topics Concern    Do you use sunscreen? No    Tanning bed use? No    Are you easily burned? No    Excessive sun exposure? No    Blistering sunburns? No   Social History Narrative    Single.    No children    Works 2 days per week.  [employer] in [employer]     Social Drivers of Health     Financial Resource Strain: Low Risk  (07/31/2023)    Overall Financial Resource Strain (CARDIA)     Difficulty of Paying Living Expenses: Not hard at all   Food Insecurity: No Food Insecurity (03/27/2024)    Received from [facility]    Hunger Vital Sign     Worried About Running Out of Food in the Last Year: Never true     Ran Out of Food in the Last Year: Never true   Transportation Needs: Unknown (03/27/2024)    Received from [facility] - Transportation     Lack of Transportation (Medical): No     Lack of Transportation (Non-Medical): Patient unable to answer   Social Connections: Unknown (03/27/2024)    Received from [facility]    Social Connection and Isolation Panel [NHANES]     Frequency of Communication with Friends and Family: Patient declined     Frequency of Social Gatherings with Friends and Family: More than three times a week     Attends Religious Services: 1 to 4 times per year     Active Member of [employer] or Organizations: Patient declined     Attends Banker Meetings: Patient declined     Marital Status:  Never married   Housing: Low Risk  (03/09/2024)    Housing     Within the past 12 months, have you ever stayed: outside, in a car, in a tent, in an overnight shelter, or temporarily in someone else's home (i.e. couch-surfing)?: No     Are you worried about losing your housing?: No       Family History   Problem Relation Age of Onset    Diabetes Mother     Heart disease Mother     Hypertension Mother     Arthritis Mother     Aneurysm Father     Skin cancer Neg Hx     Melanoma Neg Hx     Basal cell carcinoma Neg Hx     Squamous cell carcinoma Neg Hx        Code Status: Full Code     Review of Systems     A 10-system review of systems was conducted and was negative except as documented above in the HPI.       Objective        Temp:  [37.1 ??C (98.8 ??F)] 37.1 ??C (98.8 ??F)  Heart Rate:  [88-101] 88  Resp:  [18-20] 20  BP: (118-179)/(73-102) 148/82  MAP (mmHg):  [87-123] 99  SpO2:  [96 %-100 %] 100 %  No intake/output data recorded.    Physical Exam:  General Exam:  General Appearance:Chronically ill appearing. In no acute distress.  HEENT: Head is atraumatic and normocephalic. Sclera anicteric without injection. Oropharyngeal membranes are moist with no erythema or exudate.  Neck: Deferred.  Lungs: Normal work of breathing.  Heart: Warm, well perfused  Abdomen: Nondistended.  Extremities: No clubbing, cyanosis, or edema.    Neurological Exam:  Mental Status: Alert, conversant, able to follow conversation and interview. Spontaneous speech was fluent without word finding pauses, dysarthria, or paraphasic errors. Comprehension was intact.  Cranial Nerves: PERRL. Pursuit eye movements were uninterrupted with full range and without more than end-gaze nystagmus. Facial sensation intact bilaterally to light touch in all three divisions of CNV. Face asymmetric at with left ptosis. Normal facial movement bilaterally, including forehead, eye closure and grimace/smile. Hearing intact to conversation. Shoulder shrug full strength bilaterally. Palate movement is symmetric. Tongue protrudes midline and tongue movements are normal.   Motor Exam: Normal bulk. Flaccid in RUE (before jerking) Jerking was seen as rhythmic in RUE  RUE: before jerking: 2/5 deltoid, 1/5 otherwise throughout to noxious  RUE: 10 min after jerking: 4- to 3/5 throughout.  LUE: 5/5 throughout  BLE: 5/5 HF, KE, KF, DF, PF  Reflexes: biceps 1+, brachioradialis 1+, patella 1+, no ankle  Hoffman negative, Babinski negative   Sensory:  Intact to light tough throughout     Cerebellar/Coordination/Gait: Finger-to-nose is normal without ataxia or dysmetria bilaterally.          Diagnostic Studies      All Labs Last 24hrs:   Recent Results (from the past 24 hours)   ECG 12 Lead    Collection Time: 03/30/24 12:53 AM   Result Value Ref Range    EKG Systolic BP  mmHg    EKG Diastolic BP  mmHg    EKG Ventricular Rate 101 BPM    EKG Atrial Rate 101 BPM    EKG P-R Interval 156 ms    EKG QRS Duration 96 ms    EKG Q-T Interval 362 ms    EKG QTC Calculation 469 ms    EKG Calculated P Axis  47 degrees    EKG Calculated R Axis -5 degrees    EKG Calculated T Axis 42 degrees    QTC Fredericia 430 ms   hsTroponin I (serial 0-2-6H w/ delta)    Collection Time: 03/30/24  1:11 AM   Result Value Ref Range    hsTroponin I 10 <=53 ng/L   Comprehensive Metabolic Panel    Collection Time: 03/30/24  1:11 AM   Result Value Ref Range    Sodium 141 135 - 145 mmol/L    Potassium 3.7 3.4 - 4.8 mmol/L    Chloride 108 (H) 98 - 107 mmol/L    CO2 22.0 20.0 - 31.0 mmol/L    Anion Gap 11 5 - 14 mmol/L    BUN 10 9 - 23 mg/dL    Creatinine 6.21 (H) 0.73 - 1.18 mg/dL    BUN/Creatinine Ratio 8     eGFR CKD-EPI (2021) Male 68 >=60 mL/min/1.71m2    Glucose 122 70 - 179 mg/dL    Calcium 9.4 8.7 - 30.8 mg/dL    Albumin 3.6 3.4 - 5.0 g/dL    Total Protein 8.9 (H) 5.7 - 8.2 g/dL    Total Bilirubin 0.3 0.3 - 1.2 mg/dL    AST 27 <=65 U/L    ALT 18 10 - 49 U/L    Alkaline Phosphatase 86 46 - 116 U/L   Magnesium Level    Collection Time: 03/30/24  1:11 AM   Result Value Ref Range    Magnesium 1.8 1.6 - 2.6 mg/dL   Blood Gas, Venous    Collection Time: 03/30/24  1:11 AM   Result Value Ref Range    Specimen Source Venous     FIO2 Venous Not Specified     pH, Venous 7.35 7.32 - 7.43    pCO2, Ven 42 40 - 60 mm Hg    pO2, Ven 56 (H) 30 - 55 mm Hg    HCO3, Ven 23 22 - 27 mmol/L    Base Excess, Ven -2.7 (L) -2.0 - 2.0    O2 Saturation, Venous 87.0 (H) 40.0 - 85.0 %   CBC w/ Differential    Collection Time: 03/30/24  1:11 AM   Result Value Ref Range    WBC 6.2 3.6 - 11.2 10*9/L    RBC 4.75 4.26 - 5.60 10*12/L    HGB 12.8 (L) 12.9 - 16.5 g/dL    HCT 78.4 69.6 - 29.5 %    MCV 82.6 77.6 - 95.7 fL    MCH 27.0 25.9 - 32.4 pg    MCHC 32.7 32.0 - 36.0 g/dL    RDW 28.4 (H) 13.2 - 15.2 %    MPV 8.0 6.8 - 10.7 fL    Platelet 284 150 - 450 10*9/L    Neutrophils % 53.3 %    Lymphocytes % 36.6 %    Monocytes % 7.4 %    Eosinophils % 2.2 %    Basophils % 0.5 %    Absolute Neutrophils 3.3 1.8 - 7.8 10*9/L    Absolute Lymphocytes 2.3 1.1 - 3.6 10*9/L    Absolute Monocytes 0.5 0.3 - 0.8 10*9/L    Absolute Eosinophils 0.1 0.0 - 0.5 10*9/L    Absolute Basophils 0.0 0.0 - 0.1 10*9/L    Anisocytosis Slight (A) Not Present   POCT Glucose    Collection Time: 03/30/24  1:18 AM   Result Value Ref Range    Glucose, POC 117 70 - 179 mg/dL   hsTroponin  I - 2 Hour    Collection Time: 03/30/24  2:57 AM   Result Value Ref Range    hsTroponin I 10 <=53 ng/L    delta hsTroponin I 0 <=7 ng/L

## 2024-03-30 NOTE — Unmapped (Addendum)
 Outpatient follow up:  [ ]  Neurology:  seizures, aseptic meningitis, follow up pending labs (AI/PNP panel)  [ ]  Gastroenterology:  diarrhea  [ ]  Cardiology:  overdue for 1 year follow up for CAD, HFpEF, discuss need for antiplatelet therapy for cardiac stent  [ ]  PCP:  hyperglycemia and issue refilling insulin , diarrhea    Ryan Davenport is a 63 y.o. male with a history of HIV on Biktarvy , DVT on AC, multiple prior infections (CMV right retinitis, molluscum contagium, secondary syphillis, septic arthritis), and T2DM who presented to [facility] on 03/30/2024 for new onset focal seizures thought to be secondary to viral meningitis based on CSF studies although no virus was identified.  ID was consulted.  Seizures were difficult to control on multiple ASM and patient should continue these until he can be seen in outpatient neurology clinic.  Course was complicated by diarrhea, for which GI was consulted.  He discharged home.    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  EEG showed left parasagittal focal electro-clinical seizures with 5 ASM initially required for control.  MRI brain with contrast unremarkable.  CSF with 13 nucleated cells with lymphocytic predominance raises suspicion for viral meningo-encephalitis as cause of acute symptomatic seizures in the setting of diarrhea although no virus identified.  ID was consulted as below.  Patient should continue on ASM regimen below until neurology follow up when Keppra  should be weaned first.    ASM regimen:   - Keppra  1.5 g twice daily  -- Onfi  5 mg twice daily    Workup:  -- MRI brain w/wo 04/01/24 (epilepsy protocol): Unremarkable  --EEG (4/7-): Frequent focal electroclinical seizures arising from the left parasagittal region with clinical correlate of right arm flexion and clonic activity  -- Serum labs: WBC 6.2, CD4 count 690, HIV RNA 1369,  - CSF labs: 13 nucleated cells, less than 2 RBC, protein 42, glucose 94, culture no growth to date, CMV negative, EBV negative, enterovirus negative, HHV-6 negative, HSV 1/2 negative, VZV negative, crypto antigen negative, cytology with no malignant cells and mostly small lymphocytes without viral cytopathic effect, heme path with no blasts or abnormal lymphoid cells or monotypic B/T cells  -- Stool: GI pathogen panel negative  -- UA noninfectious    #Diarrhea  Patient has acute on chronic diarrhea. No blood in stool at home or inpatient. He states that taking large amounts of medications orally with no food is making his diarrhea worse. GIPP negative.  O&P pending.  GI was consulted.  Diarrhea is improving, can follow up outpatient with PCP.     #T2DM  History of poorly controlled type 2 diabetes with A1c 12 (increased from 6.4% 3 months prior).  Initial presentation with hyperglycemia at [facility], 4/5 to 500s; patient states this is due to recent medication non-compliance from difficulty with insurance approval of his insulin , which has been resolved.  On 12 units Lantus  nightly, recently restarted 4/1, and 5 units lispro 3 times daily AC.      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  Medical history complicated by multiple infections including right CMV retinitis, molluscum contagiosum, and secondary syphilis, all status post treatment.  Continued home Biktarvy . Patient received empiric  acyclovir  until HSV labs returned negative.  ID was consulted when patient arrived due to concern for infectious cause of seizures given history of HIV.  Continue to follow with ID outpatient.     #CAD s/p stent 2021   #HLD  #HTN  #HFpEF  History of CAD ISO HLD s/p stent 2021, though not previously on aspirin at home.  Continued  home atorvastatin , Metoprolol , Lisinopril  and lasix .  Patient to discuss with outpatient cardiology to determine if needing ASA, he is overdue for 12 month follow up (last visit 09/2022).    #Hx DVT/PE  DVT 2012, on long-term anticoagulation as per hematology.  Held inpatient for LP and restarted on discharge.     #Hx Gout:  Restarted home colchicine  for gout flare in his right foot    #Depression:  Continued home sertraline  50 mg daily

## 2024-03-30 NOTE — Unmapped (Addendum)
 Ryan Davenport is a 63 y.o. male with a history of uncontrolled type 2 diabetes, history of PE, HIV, hypertension who presents with right arm spasm, numbness, and pain.     Received sign out from previous provider.    Neurology consulted and recommend MRI brain.    Updates  ED Course as of 03/30/24 0919   Mon Mar 30, 2024   0820 Keppra 30mg /kg given per neurology reccs   [phone] Valium given pre-MRI   0919 Neurology to admit for further workup         Portions of this record have been created using Dragon dictation software. Dictation errors have been sought, but may not have been identified and corrected.      Sherrlyn Dolores, DO  Resident  03/30/24 0919       Sherrlyn Dolores, DO  Resident  03/30/24 [phone]

## 2024-03-30 NOTE — Unmapped (Signed)
 [facility]  Emergency Department Provider Note      ED Clinical Impression      Final diagnoses:   Spasm (Primary)        Impression, Medical Decision Making, Progress Notes and Critical Care      Impression, Differential Diagnosis and Plan of Care    Ryan Davenport is a 63 y.o. male with a history of uncontrolled type 2 diabetes, history of PE, HIV, hypertension who presents with right arm spasm, numbness, and pain.    Differential includes radiculopathy, peripheral neuropathy secondary to diabetes, MSK-related spasm, electrolyte abnormality.  Presenting glucose of 117.  Patient states that he was trying to eat healthier today.  CMP within normal limits and blood pressure within normal limits.  IM not concerning for acute dehydration at this time.  Patient does have these episodic spasms with flexion of his arm.  There is significant neurologic changes including loss of sensation, shooting pain, and weakness distally there is concern for radiculopathy.  Is likely to be a peripheral neuropathy issue given that is only 1 extremity however patient does have A1c of 12 a week ago.     Will manage pain here with Tylenol, Flexeril, gabapentin, Toradol.  We will obtain obtain MRI of his cervical spine.  Reports no history of trauma to that arm.  Patient did receive an x-ray at outside hospital that was negative for fractures.     Independent Interpretation of Studies    I have independently interpreted the following studies:  EKG: sinus tachy  CT/MRI(s): pending     Discussion of Management with other Providers or Support Staff    I discussed the management of this patient with the:  Admitting provider: Brandon Cairo MD              Portions of this record have been created using Dragon dictation software. Dictation errors have been sought, but may not have been identified and corrected.    See chart and nursing documentation for additional details.    ____________________________________________ History        Reason for Visit  Arm Pain      HPI   Ryan Davenport is a 63 y.o. male with a history of uncontrolled type 2 diabetes, history of PE, HIV, hypertension who presents with worsening right arm spasm, numbness, and pain.  Patient describes that his arm pain started on 3/31 which he was evaluated at [facility] ED for.  At that time he was noted to be hyperglycemic 900s.  He again was evaluated on 4/03 and 4/05 for similar presentation pain.  He was evaluated with x-ray and noted did not have any fracture.  At last ED visit he was discharged with gabapentin and Flexeril.  He states that he has been taking the Flexeril and Tylenol for his pain.  He describes his pain as sharp and shooting of his right arm that is intermittent.  No trauma.  At baseline he has decreased sensation of his right fingers, hand, arm.    During interview, patient was reported to have one of his episodes.  Patient was noted to be in extreme pain with right arm flexed.  Following the episode, patient was unable to move his right arm, wrist, fingers.  He states that his right arm entirely is numb.       External Records Reviewed  Inpatient/Outpatient notes, Prior labs/imaging studies, Care Everywhere, PDMP, External ED notes, etc    Past Medical History:   Diagnosis  Date    Asthma     Asymptomatic human immunodeficiency virus (HIV) infection status 04/09/2013    Diagnosed in 2005 with a CD4 of 32 and on at least one prior regimen on a study, then switched to Truvada with ritonavir boosted atazanavir. Changed to Truvada with boosted darunavir secondary to reflux symptoms. Has remained undetectable for several years with CD4 count greater than 500.      CAD (coronary artery disease)     Depression     Diabetes mellitus     DVT (deep venous thrombosis)     Eczema     Enlargement of liver     GERD (gastroesophageal reflux disease)     HIV (human immunodeficiency virus infection)     Hyperlipidemia     Hypertension     Myocardial infarction     Obesity     Pulmonary embolism 2012    Sleep apnea        Patient Active Problem List   Diagnosis    Pulmonary embolism 1/12, long-term anticoag per hematology    Allergy to latex    Chronic cardiopulmonary disease    Chronic diastolic heart failure    Cytomegaloviral disease    Dermatitis    Dysplasia of anus    Enthesopathy of knee    Esophageal reflux    Gout    Horseshoe tear of retina    Human immunodeficiency virus (HIV) disease    Essential hypertension    Molluscum contagiosum    Mononeuritis    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 infarction    Convulsions    Hereditary and idiopathic peripheral neuropathy    Inguinal hernia    Early syphilis, secondary syphilis    Coronary atherosclerosis (RAF-HCC)    Acid reflux (RAF-HCC)    Chronic eczema    Depression    Allergic rhinitis    Type 2 diabetes mellitus    Diastasis recti    AIDS    Obesity (BMI 30.0-34.9)    Disc disease, degenerative, lumbar or lumbosacral    'Light-for-dates' infant with signs of fetal malnutrition    Hyperlipidemia    Syncope    Dehydration    Diarrhea of presumed infectious origin    Acute renal failure (ARF)    Hyperglycemia       Past Surgical History:   Procedure Laterality Date    PR COLONOSCOPY FLX DX W/COLLJ SPEC WHEN PFRMD N/A 11/11/2013    Procedure: COLONOSCOPY, FLEXIBLE, PROXIMAL TO SPLENIC FLEXURE; DIAGNOSTIC, W/WO COLLECTION SPECIMEN BY BRUSH OR WASH;  Surgeon: Elfrieda Grise, MD;  [facility];  Service: Gastroenterology    PR UPPER GI ENDOSCOPY,DIAGNOSIS N/A 04/01/2019    Procedure: UGI ENDO, INCLUDE ESOPHAGUS, STOMACH, & DUODENUM &/OR JEJUNUM; DX W/WO COLLECTION SPECIMN, BY BRUSH OR WASH;  Surgeon: Dorthy Gavia, MD;  [facility];  Service: Gastroenterology    SKIN BIOPSY         No current facility-administered medications for this encounter.    Current Outpatient Medications:     atorvastatin (LIPITOR) 40 MG tablet, Take 1 tablet (40 mg total) by mouth daily., Disp: 90 tablet, Rfl: 3    betamethasone dipropionate 0.05 % cream, Apply 1 Application topically two (2) times a day., Disp: , Rfl:     BIKTARVY 50-200-25 mg tablet, Take 1 tablet by mouth daily., Disp: 90 tablet, Rfl: 3    blood sugar diagnostic (GLUCOSE BLOOD) Strp, Use to check blood sugar as directed with insulin 3 times  a day & for symptoms of high or low blood sugar., Disp: 100 strip, Rfl: 0    blood-glucose meter kit, Use as instructed, Disp: 1 each, Rfl: 0    cetirizine (ZYRTEC) 10 MG tablet, Take 1 tablet (10 mg total) by mouth two (2) times a day., Disp: 60 tablet, Rfl: 11    colchicine (COLCRYS) 0.6 mg tablet, TAKE 1 TABLET BY MOUTH DAILY AS NEEDED FOR GOUT, Disp: 90 tablet, Rfl: 3    dupilumab (DUPIXENT SYRINGE) 300 mg/2 mL Syrg injection, Inject 2 mL (300 mg total) under the skin every fourteen (14) days. For maintenance dose, Disp: 4 mL, Rfl: 11    dupilumab (DUPIXENT SYRINGE) 300 mg/2 mL Syrg injection, Inject the contents of 2 pens (600 mg) under the skin once for loading dose, Disp: 4 mL, Rfl: 0    ELIQUIS 5 mg Tab, Take 1 tablet (5 mg total) by mouth two (2) times a day., Disp: , Rfl:     empagliflozin (JARDIANCE) 25 mg tablet, Take 1 tablet (25 mg total) by mouth daily., Disp: 90 tablet, Rfl: 3    furosemide (LASIX) 20 MG tablet, Take 1 tablet (20 mg total) by mouth daily., Disp: , Rfl:     gabapentin (NEURONTIN) 100 MG capsule, Take 1 capsule (100 mg total) by mouth Three (3) times a day., Disp: 90 capsule, Rfl: 0    hydrocortisone 2.5 % ointment, Apply 1 Application topically two (2) times a day. To active areas of the face until smooth, Disp: 454 g, Rfl: 5    hydrOXYzine (ATARAX) 25 MG tablet, Take 1 tablet (25 mg total) by mouth every six (6) hours as needed., Disp: , Rfl:     insulin aspart (NOVOLOG FLEXPEN) 100 unit/mL (3 mL) injection pen, Inject 0.05 mL (5 Units total) under the skin Three (3) times a day before meals., Disp: 4.5 mL, Rfl: 0    insulin glargine (BASAGLAR, LANTUS) 100 unit/mL (3 mL) injection pen, Inject 0.12 mL (12 Units total) under the skin nightly., Disp: 3.6 mL, Rfl: 0    lancets Misc, Use to check blood sugar as directed with insulin 3 times a day & for symptoms of high or low blood sugar., Disp: 100 each, Rfl: 0    lisinopril (PRINIVIL,ZESTRIL) 10 MG tablet, Take 1 tablet (10 mg total) by mouth daily., Disp: 30 tablet, Rfl: 0    methocarbamoL (ROBAXIN) 750 MG tablet, 750mg  4x daily prn muscle spasms, Disp: 60 tablet, Rfl: 2    metoprolol succinate (TOPROL-XL) 25 MG 24 hr tablet, Take 1 tablet (25 mg total) by mouth daily., Disp: 90 tablet, Rfl: 3    omeprazole (PRILOSEC) 40 MG capsule, 40mg  BID, Disp: 180 capsule, Rfl: 3    pen needle, diabetic 32 gauge x 5/32 (4 mm) Ndle, Use with insulin up to 4 times/day as needed., Disp: 1 each, Rfl: 0    [Paused] semaglutide (RYBELSUS) 14 mg Tab, Take 14 mg by mouth daily., Disp: 30 tablet, Rfl: 11    sertraline (ZOLOFT) 50 MG tablet, Take 1 tablet (50 mg total) by mouth daily., Disp: 30 tablet, Rfl: 0    triamcinolone (KENALOG) 0.1 % ointment, Apply topically two (2) times a day. To active areas of trunk and extremities until smooth, Disp: 454 g, Rfl: 5    Allergies  Latex, Shellfish containing products, Metformin, and Penicillins    Family History   Problem Relation Age of Onset    Diabetes Mother     Heart disease Mother  Hypertension Mother     Arthritis Mother     Aneurysm Father     Skin cancer Neg Hx     Melanoma Neg Hx     Basal cell carcinoma Neg Hx     Squamous cell carcinoma Neg Hx        Social History  Social History     Tobacco Use    Smoking status: Never     Passive exposure: Never    Smokeless tobacco: Never   Vaping Use    Vaping status: Never Used   Substance Use Topics    Alcohol use: No     Alcohol/week: 0.0 standard drinks of alcohol    Drug use: No          Physical Exam       ED Triage Vitals   Enc Vitals Group      BP 03/30/24 0037 118/79      Heart Rate 03/30/24 0026 98      SpO2 Pulse --       Resp 03/30/24 0037 18 Temp 03/30/24 0037 37.1 ??C (98.8 ??F)      Temp Source 03/30/24 0037 Oral      SpO2 03/30/24 0026 96 %      Weight --       Height --       Head Circumference --       Peak Flow --       Pain Score --       Pain Loc --       Pain Education --       Exclude from Growth Chart --      General Appearance:Well appearing. In no acute distress.  HEENT: Head is atraumatic and normocephalic. Sclera anicteric without injection. Oropharyngeal membranes are moist with no erythema or exudate.  Neck: Supple.  Lungs: Clear to auscultation in anterior fields. No wheezes or crackles.  Heart: Regular rate and rhythm. No murmurs, rubs, or gallops.  Abdomen: Soft, nontender, nondistended.  Extremities: No clubbing, cyanosis, or edema.    Neurological Examination:     Mental Status: Alert, conversant, able to follow conversation and interview. Spontaneous speech was fluent without word finding pauses, dysarthria, or paraphasic errors. Comprehension was intact to simple and multi-step commands. Memory for recent and remote events was intact.    Cranial Nerves: PERRL  Pursuit eye movements were uninterrupted with full range and without more than end-gaze nystagmus. Facial sensation intact bilaterally to light touch on the forehead, cheek, and chin. Face symmetric at rest. Normal facial movement bilaterally, including forehead, eye closure and grimace/smile. Hearing intact to conversation. Shoulder shrug 5/5 on left and 4/5 on right. Palate movement is symmetric. Tongue protrudes midline and tongue movements are normal.    Motor Exam: Normal bulk.  No tremors, myoclonus, or other adventitious movement.  Pronator drift is absent.  RUE: 3/5 on deltoids, 0/5 biceps, triceps. 0/5 wrist or fingers. Unable to squeeze hand  LUE: 5/5 grossly throughout. Mild weakness on squeeze  RLE: 5/5 grossly throughout.  LLE: 5/5 grossly throughout.        Sensory: decrease sensation on right arm compared to left   Radiology       All head, neck, and brain imaging below was personally reviewed and I agree with radiologist interpretation.  MRI Cervical Spine W Wo Contrast    (Results Pending)           Procedures   none  Nichole Barker, MD  Resident  03/30/24 [phone]

## 2024-03-31 LAB — GLUCOSE, RANDOM: GLUCOSE RANDOM: 151 mg/dL (ref 70–179)

## 2024-03-31 LAB — HIV RNA, QUANTITATIVE, PCR
HIV RNA LOG10: 3.14 {Log_copies}/mL — ABNORMAL HIGH (ref <0.00)
HIV RNA QNT RSLT: DETECTED — AB
HIV RNA: 1369 {copies}/mL — ABNORMAL HIGH (ref <0)

## 2024-03-31 MED ADMIN — valproate (DEPACON) 2,200 mg in sodium chloride (NS) 0.9 % 100 mL IVPB: 2200 mg | INTRAVENOUS | @ 13:00:00 | Stop: 2024-03-31

## 2024-03-31 MED ADMIN — levETIRAcetam (KEPPRA) injection 1,500 mg: 1500 mg | INTRAVENOUS | @ 13:00:00

## 2024-03-31 MED ADMIN — lidocaine (XYLOCAINE) 10 mg/mL (1 %) injection 20 mL: 20 mL | @ 21:00:00 | Stop: 2024-03-31

## 2024-03-31 MED ADMIN — furosemide (LASIX) tablet 20 mg: 20 mg | ORAL | @ 13:00:00

## 2024-03-31 MED ADMIN — sodium chloride 0.9% (NS) bolus 250 mL: 250 mL | INTRAVENOUS | @ 10:00:00 | Stop: 2024-05-02

## 2024-03-31 MED ADMIN — insulin lispro (HumaLOG) injection 0-20 Units: 0-20 [IU] | SUBCUTANEOUS | @ 16:00:00

## 2024-03-31 MED ADMIN — pyridoxine (vitamin B6) (B-6) injection 500 mg: 500 mg | INTRAVENOUS | @ 06:00:00 | Stop: 2024-03-31

## 2024-03-31 MED ADMIN — insulin lispro (HumaLOG) injection 0-20 Units: 0-20 [IU] | SUBCUTANEOUS | @ 22:00:00

## 2024-03-31 MED ADMIN — sodium chloride 0.9% (NS) bolus 250 mL: 250 mL | INTRAVENOUS | Stop: 2024-05-02

## 2024-03-31 MED ADMIN — lacosamide (VIMPAT) injection 200 mg: 200 mg | INTRAVENOUS | @ 13:00:00

## 2024-03-31 MED ADMIN — insulin glargine (LANTUS) injection 12 Units: 12 [IU] | SUBCUTANEOUS | @ 03:00:00

## 2024-03-31 MED ADMIN — sodium chloride 0.9% (NS) bolus 250 mL: 250 mL | INTRAVENOUS | @ 16:00:00 | Stop: 2024-05-02

## 2024-03-31 MED ADMIN — sodium chloride 0.9% (NS) bolus 250 mL: 250 mL | INTRAVENOUS | @ 08:00:00 | Stop: 2024-05-02

## 2024-03-31 MED ADMIN — lacosamide (VIMPAT) injection 100 mg: 100 mg | INTRAVENOUS | @ 01:00:00 | Stop: 2024-03-30

## 2024-03-31 MED ADMIN — sodium chloride 0.9% (NS) bolus 250 mL: 250 mL | INTRAVENOUS | @ 03:00:00 | Stop: 2024-05-02

## 2024-03-31 MED ADMIN — sodium chloride 0.9% (NS) bolus 250 mL: 250 mL | INTRAVENOUS | @ 19:00:00 | Stop: 2024-05-02

## 2024-03-31 MED ADMIN — pantoprazole (Protonix) EC tablet 40 mg: 40 mg | ORAL | @ 13:00:00

## 2024-03-31 MED ADMIN — magnesium oxide (MAG-OX) tablet 400 mg: 400 mg | ORAL | @ 13:00:00 | Stop: 2024-03-31

## 2024-03-31 MED ADMIN — acyclovir (ZOVIRAX) 780 mg in sodium chloride (NS) 0.9 % 250 mL IVPB: 10 mg/kg | INTRAVENOUS | @ 18:00:00

## 2024-03-31 MED ADMIN — lisinopril (PRINIVIL,ZESTRIL) tablet 10 mg: 10 mg | ORAL | @ 13:00:00

## 2024-03-31 MED ADMIN — acyclovir (ZOVIRAX) 780 mg in sodium chloride (NS) 0.9 % 250 mL IVPB: 10 mg/kg | INTRAVENOUS | @ 09:00:00

## 2024-03-31 MED ADMIN — insulin lispro (HumaLOG) injection 0-20 Units: 0-20 [IU] | SUBCUTANEOUS | @ 03:00:00

## 2024-03-31 MED ADMIN — sertraline (ZOLOFT) tablet 50 mg: 50 mg | ORAL | @ 13:00:00

## 2024-03-31 MED ADMIN — metoPROLOL succinate (Toprol-XL) 24 hr tablet 25 mg: 25 mg | ORAL | @ 13:00:00

## 2024-03-31 MED ADMIN — atorvastatin (LIPITOR) tablet 40 mg: 40 mg | ORAL | @ 13:00:00

## 2024-03-31 MED ADMIN — LORazepam (ATIVAN) injection 2 mg: 2 mg | INTRAVENOUS | @ 03:00:00 | Stop: 2024-03-30

## 2024-03-31 MED ADMIN — empagliflozin (JARDIANCE) tablet 25 mg: 25 mg | ORAL | @ 13:00:00

## 2024-03-31 MED ADMIN — bictegrav-emtricit-tenofov ala (BIKTARVY) 50-200-25 mg tablet 1 tablet: 1 | ORAL | @ 13:00:00

## 2024-03-31 MED ADMIN — acyclovir (ZOVIRAX) 780 mg in sodium chloride (NS) 0.9 % 250 mL IVPB: 10 mg/kg | INTRAVENOUS | @ 02:00:00

## 2024-03-31 MED ADMIN — LORazepam (ATIVAN) injection 1 mg: 1 mg | INTRAVENOUS | @ 09:00:00 | Stop: 2024-03-31

## 2024-03-31 NOTE — Unmapped (Signed)
 Lumbar Puncture Note        Pre-procedural planning     Patient Name: Ryan Davenport  Patient MRN: [id number]    Indications: Seizure disorder.    Contraindications to performing lumbar puncture: Local skin infections over the proposed puncture site, known elevated ICP other than in IIH or cryptococcal meningitis, and/or an uncontrolled bleeding diathesis.     Imaging prior to lumbar puncture: Generally, imaging should be obtained prior to performing a lumbar puncture if the patient is > 68 years old, immunocompromised, has had a seizure within one week of presentation, has an abnormal level of consciousness, or an abnormal neurologic exam (including papilledema or loss of venous pulsations on fundoscopy). Imaging has been obtained and I do not appreciate any evidence of elevated ICP so it is safe to proceed with the lumbar puncture.    Known Bleeding Diathesis: Patient/caregiver denies any known bleeding or platelet disorder.       DVT Prophylaxis: No DVT prophylaxis.    Antiplatelet Agents: This patient is not on an antiplatelet agent.    Systemic Anticoagulation: Eliquis (apixaban). This medication should be held for 48 hours (longer in renal impairment) prior to lumbar puncture. It may be restarted 5 hours after the procedure.    As part of the consent process I went over the following risks and complications: headache, backache, bleeding that could result in paralysis, and infection.          Procedure Details     Time-out performed immediately prior to the procedure.    Participants in Time out: Iline Mallory , [place]  Time out completed: Yes  Label verification: YES    Patient was placed in the sitting position with hips and neck in flexion.    The superior aspect of the iliac crests were identified, with the traverse demarcating the L4-L5 interspace. This area was prepped and draped in the usual sterile fashion. Local anesthesia with 1% lidocaine was applied subcutaneously then deep to the skin. The spinal needle with stillete was introduced at the L4 - L5 interspace with frequent removal of the stillete to evaluate for cerebrospinal fluid. CSF samples were collected in four separate tubes and sent to the lab after proper labeling. The spinal needle with trocar was removed, with minimal bleeding noted upon removal. A sterile bandage was placed over the puncture site after holding pressure.     Findings     25 mL of clear spinal fluid was obtained.  An opening pressure was not obtained.    CSF was sent for cell count with differential, protein, glucose, gram stain and culture, and cytology           Complications     None; patient tolerated the procedure well.    The attending provider, Dr. Parker Bollard, was available.    Brady Cagey MD  Resident Physician PGY3  [facility] Department of Neurology

## 2024-03-31 NOTE — Unmapped (Signed)
 OCCUPATIONAL THERAPY  Evaluation (03/31/24 0901)    Patient Name:  Ryan Davenport       Medical Record Number: [id number]     Date of Birth: September 19, 1961  Sex: Male      Post-Discharge Occupational Therapy Recommendations: 5x weekly, High intensity          Equipment Recommendation  OT DME Recommendations: Defer to post acute (tub transfer bench currently recommended for safe access to shower)       OT Treatment Diagnosis: Cognitive changes due to medical disorder, Limitation of activities due to disability, Need for assistance with personal care, Reduced mobility, Unsteadiness on feet         Assessment  Problem List: Decreased endurance, Decreased strength  Personal Factors/Comorbidities (Occupational Profile and History Review): Expanded (Moderate)  Assessment of Occupational Performance : Balance, Cognitive skills, Dexterity, Endurance, Fine or gross motor coordination, Psychosocial skills, Mobility, Sensation, Strength  Clinical Decision Making: Moderate Complexity    Assessment: [name] is a 63 y.o. 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  Pt. presents somnolent appearing with mild balance impairment, R hand ataxia and need for frequent redirection impacting his ability to safely and independently manage his ADL, transfers, and functional mobility.  Pt. would benefit from continued skilled OT services to help him reach his safest and most functional level of independence.       Today's Interventions: Pt. educated on role of OT, safety, transfers.  Pt. able to complete ADL assessment as well as supine to sit transfer (CGA), static sitting (min A), figure four to don/doff socks (min A), sit to stand (min A), room level functional mobility (min A), standing grooming to prep toothrbush, brush teeth (w/ L hand), and wash face (w/ L hand).    Activity Tolerance During Today's Session  Tolerated treatment well, Limited by Mental Status    Plan  Planned Frequency of Treatment: Plan of Care Initiated: 03/31/24  1-2x per day Weekly Frequency: 3-4 days per week       Planned Interventions:  Education (Patient/Family/Caregiver), Home Exercise Program, Neuromuscular Re-education, Clinical research associate, Self-Care/Home Training, Therapeutic Exercise, Therapeutic Activity, Visual/Perceptual training      GOALS:   Patient and Family Goals: to go home independently    Short Term:   SHORT GOAL #1: Pt. will complete standing grooming at sink, including initiation and sequencing and use of RUE as fully functional assist, at mod I   Time Frame : 2 weeks  SHORT GOAL #2: Pt. will demonstrate ability to advocate for toileting and complete toilet transfer independently   Time Frame : 2 weeks  SHORT GOAL #3: Pt. will complete hall level functional mobility to navigate hallway and use context/memory to orient and locate his room independently   Time Frame : 2 weeks           Long Term Goal #1: Pt. will score 23+ on AMPAC within 3 weeks  Time Frame: 3 weeks    Prognosis:  Good  Positive Indicators:     Barriers to Discharge: Impaired Balance, Gait instability, Endurance deficits, Inability to safely perform ADLS, Cognitive deficits, Severity of deficits    Subjective  Medical Updates Since Last Visit/Relevant PMH Affecting Clinical Decision Making: seizure monitoring  Prior Functional Status Pt. reported independence for ADL and functional mobility prior to admission.  Pt. reported that his last known seizure was in 2013            Patient / Caregiver reports: I noticed it on Monday - in reference to involuntary ataxic hand wringing during functional tasks.      Past Medical History:   Diagnosis Date    Asthma     Asymptomatic human immunodeficiency virus (HIV) infection status 04/09/2013    Diagnosed in 2005 with a CD4 of 32 and on at least one prior regimen on a study, then switched to Truvada with ritonavir boosted atazanavir. Changed to Truvada with boosted darunavir secondary to reflux symptoms. Has remained undetectable for several years with CD4 count greater than 500.      CAD (coronary artery disease)     Depression     Diabetes mellitus     DVT (deep venous thrombosis)     Eczema     Enlargement of liver     GERD (gastroesophageal reflux disease)     HIV (human immunodeficiency virus infection)     Hyperlipidemia     Hypertension     Myocardial infarction     Obesity     Pulmonary embolism 2012    Sleep apnea     Social History     Tobacco Use    Smoking status: Never     Passive exposure: Never    Smokeless tobacco: Never   Substance Use Topics    Alcohol use: No     Alcohol/week: 0.0 standard drinks of alcohol      Past Surgical History:   Procedure Laterality Date    PR COLONOSCOPY FLX DX W/COLLJ SPEC WHEN PFRMD N/A 11/11/2013    Procedure: COLONOSCOPY, FLEXIBLE, PROXIMAL TO SPLENIC FLEXURE; DIAGNOSTIC, W/WO COLLECTION SPECIMEN BY BRUSH OR WASH;  Surgeon: Elfrieda Grise, MD;  Location: [facility];  Service: Gastroenterology    PR UPPER GI ENDOSCOPY,DIAGNOSIS N/A 04/01/2019    Procedure: UGI ENDO, INCLUDE ESOPHAGUS, STOMACH, & DUODENUM &/OR JEJUNUM; DX W/WO COLLECTION SPECIMN, BY BRUSH OR WASH;  Surgeon: Dorthy Gavia, MD;  Location: [facility];  Service: Gastroenterology    SKIN BIOPSY      Family History   Problem Relation Age of Onset    Diabetes Mother     Heart disease Mother     Hypertension Mother     Arthritis Mother     Aneurysm Father     Skin cancer Neg Hx     Melanoma Neg Hx     Basal cell carcinoma Neg Hx     Squamous cell carcinoma Neg Hx         Latex, Shellfish containing products, Metformin, and Penicillins     Objective Findings  Precautions / Restrictions  Seizure precautions, Falls precautions       Weight Bearing  Non-applicable    Required Braces or Orthoses Non-applicable    Communication Preference  Verbal       Pain  Pt. denied pain    Equipment / Environment  Vascular access (PIV, TLC, Port-a-cath, PICC), EEG/VEEG    Living Situation  Living Environment: Apartment  Lives With: Alone  Home Living: One level home, Tub/shower unit, Standard height toilet        Cognition   Orientation Level:      Arousal/Alertness:      Attention Span:      Memory:      Following Commands:      Safety Judgment:  Awareness of Errors and Problem Solving:      Comments: Pt. somnolent throughout session, but insightful with prompting and able to follow simple one step commands with redirection.  Pt. required frequent redirection for alertness, arousal and engagement.    Vision / Hearing      Vision Comments: Pt. able to read wall clock and navigate surroundings appropriately but reported mild increased blurriness.  Pt. with somnolent appearance with eyes closed through most of session, but appeared to have slight disconjugate gaze which may be baseline strabissumus.  Hearing: No deficit identified         Hand Function:  Right Hand Function: Right hand function impaired  Right Hand Impairment: grip strength poor, coordination impaired  Left Hand Function: Left hand grip strength, ROM and coordination WNL  Hand Function comments: During volitional tasks, pt. had difficulty grasping objects with his R hand 2/2 involuntary, ataxic, wringing like movements.    Skin Inspection:  Skin Inspection: Intact where visualized    Face/Cervical ROM:  Face ROM: WFL  Cervical ROM: WFL    ROM / Strength:  UE ROM/Strength: Left WFL, Right WFL  LE ROM/Strength: Left WFL, Right WFL    Coordination:  Coordination: Impaired  Coordination comment: Pt. with difficulty grasping objects in his R hand 2/2 involuntary ataxic R hand movements.    Sensation:  Sensory/ Proprioception/ Stereognosis comments: Pt. denied paresthesia    Balance:  Static Sitting-Level of Assistance: Contact guard  Dynamic Sitting-Level of Assistance: Minimum assistance  Sitting Balance comments: Pt. with mild retropulsive lean    Static Standing-Level of Assistance: Minimum assistance  Dynamic Standing - Level of Assistance: Minimum assistance  Standing Balance comments: Pt. with mild swaying like balance impairment during standing activities.    Functional Mobility  Transfers: Contact Guard assist  Bed Mobility - Needs Assistance: Contact Guard assist  Ambulation: min A for short distance room level functional mobility 2/2 balance impairment.    ADLs  ADLs: Needs assistance with ADLs  ADLs - Needs Assistance: Feeding, Grooming, Bathing, Toileting, UB dressing, LB dressing  Feeding - Needs Assistance: Min assist  Grooming - Needs Assistance: Min assist, Performed standing  Bathing - Needs Assistance: Min assist, Performed seated  Toileting - Needs Assistance: Min assist  UB Dressing - Needs Assistance: Min assist  LB Dressing - Needs Assistance: Min assist    Vitals / Orthostatics  Vitals/Orthostatics: no s/s of distress    Patient at end of session: All needs in reach, In bed, Lines intact, Notified Nurse     Occupational Therapy Session Duration  OT Individual [mins]: 16       AM-PAC-Daily Activity  Lower Body Dressing assistance needs: A Little - Minimal/Contact Guard Assist/Supervision  Bathing assistance needs: A Little - Minimal/Contact Guard Assist/Supervision  Toileting assistance needs: A Little - Minimal/Contact Guard Assist/Supervision  Upper Body Dressing assistance needs: A Little - Minimal/Contact Guard Assist/Supervision  Personal Grooming assistance needs: A Little - Minimal/Contact Guard Assist/Supervision  Eating Meals assistance needs: A Little - Minimal/Contact Guard Assist/Supervision    Daily Activity Score: 18    Score (in points): % of Functional Impairment, Limitation, Restriction  6: 100% impaired, limited, restricted  7-8: At least 80%, but less than 100% impaired, limited restricted  9-13: At least 60%, but less than 80% impaired, limited restricted  14-19: At least 40%, but less than 60% impaired, limited restricted  20-22: At least 20%, but less than 40% impaired, limited restricted  23: At least 1%, but less than  20% impaired, limited restricted  24: 0% impaired, limited restricted      I attest that I have reviewed the above information.  Signed: Roselynn Connors, OT  Filed 03/31/2024

## 2024-03-31 NOTE — Unmapped (Signed)
 VSS. Patient is alert and oriented x 3. Patient continues on seizure precautions. Telemetry in place. Patient has had at least 3 watery stools today. Voiding well. Eating and drinking well. No complaints of pain. Patient in bed, bed alarm on, call bell within reach. No needs mentioned at this time.      Problem: Adult Inpatient Plan of Care  Goal: Plan of Care Review  Outcome: Ongoing - Unchanged  Goal: Patient-Specific Goal (Individualized)  Outcome: Ongoing - Unchanged  Goal: Absence of Hospital-Acquired Illness or Injury  Outcome: Ongoing - Unchanged  Intervention: Identify and Manage Fall Risk  Recent Flowsheet Documentation  Taken 03/31/2024 0800 by Arvid Birk, RN  Safety Interventions:   bed alarm   fall reduction program maintained   lighting adjusted for tasks/safety   low bed   nonskid shoes/slippers when out of bed   room near unit station   seizure precautions   supervised activity  Intervention: Prevent Skin Injury  Recent Flowsheet Documentation  Taken 03/31/2024 0800 by Arvid Birk, RN  Positioning for Skin: Supine/Back  Intervention: Prevent and Manage VTE (Venous Thromboembolism) Risk  Recent Flowsheet Documentation  Taken 03/31/2024 1400 by Arvid Birk, RN  Anti-Embolism Device Status: Off  Taken 03/31/2024 1200 by Arvid Birk, RN  Anti-Embolism Device Status: Off  Taken 03/31/2024 1000 by Arvid Birk, RN  Anti-Embolism Device Status: Off  Taken 03/31/2024 0800 by Arvid Birk, RN  Anti-Embolism Device Status: Off  Goal: Optimal Comfort and Wellbeing  Outcome: Ongoing - Unchanged  Goal: Readiness for Transition of Care  Outcome: Ongoing - Unchanged  Goal: Rounds/Family Conference  Outcome: Ongoing - Unchanged     Problem: Latex Allergy  Goal: Absence of Allergy Symptoms  Outcome: Ongoing - Unchanged     Problem: Fall Injury Risk  Goal: Absence of Fall and Fall-Related Injury  Outcome: Ongoing - Unchanged  Intervention: Promote Injury-Free Environment  Recent Flowsheet Documentation  Taken 03/31/2024 0800 by Arvid Birk, RN  Safety Interventions:   bed alarm   fall reduction program maintained   lighting adjusted for tasks/safety   low bed   nonskid shoes/slippers when out of bed   room near unit station   seizure precautions   supervised activity     Problem: Seizure, Active Management  Goal: Absence of Seizure/Seizure-Related Injury  Outcome: Ongoing - Unchanged  Intervention: Prevent Seizure-Related Injury  Recent Flowsheet Documentation  Taken 03/31/2024 0800 by Arvid Birk, RN  Seizure Precautions:   activity supervised   clutter-free environment maintained   emergency equipment at bedside   side rails padded     Problem: Self-Care Deficit  Goal: Improved Ability to Complete Activities of Daily Living  Outcome: Ongoing - Unchanged

## 2024-03-31 NOTE — Unmapped (Signed)
 Neurology Inpatient Team B (NMB)  Daily Progress Note       Patient: Ryan Davenport  Code Status: Full Code  Level of Care: Acute floor status.   LOS: 1 day      Overnight Events & Subjective:     -EEG overnight with focal status epilepticus. He was given ativan 2 mg 2313 and 1mg  at 0435, with good response.      Physical Exam:   Physical Exam:  General Appearance: in no acute distress, well appearing,, and alert,. Normal skin color, afebrile.  HEENT: Head is atraumatic and normocephalic. Sclera anicteric without injection. Oropharyngeal membranes are moist with no erythema or exudate.  Neck: Grossly normal range of motion.  Lungs: Normal work of breathing.  Heart: warm, well perfused.  Abdomen: Soft, nontender, nondistended.  Extremities: No clubbing, cyanosis, or edema.      NEUROLOGICAL EXAMINATION:      General/Mental Status:    Alert, conversant, able to follow conversation and interview.     Comprehension was intact.   Attention span and concentration were diminished.    Spontaneous speech was with mild dysarthria.       Cranial Nerves:   II, III - Pupils are equal and reactive to light b/l (direct and consensual reactions). No visual field defects.   III, IV, VI - Pursuit eye movements were uninterrupted with full range and without more than end-gaze nystagmus. Extra ocular movements are intact, no ptosis, no diplopia, no nystagmus.  V - Sensation of the face intact b/l to light touch in all three divisions of CNV.   VII - Face symmetrical at rest, no facial droop. Normal facial movements bilaterally including forehead, eye closure, and smile/grimace.  VIII - Hearing grossly intact to conversation.   IX and X - Palate movement is symmetric, no dysarthria.  XI - Full shoulder shrug bilaterally; normal tone and strength of sternocleidomastoid muscles bilaterally.  XII - No tongue atrophy, no tongue fasciculations; tongue protrudes midline, full range of movements of the tongue.     Motor:   Normal bulk. No tremors, myoclonus, or other adventitious movement.  Pronator drift is absent. Fasciculations not observed.      Muscle strength:  LUE 5/5  RUE 1/5 at deltoid, bicep, tricep. 2/5 at [facility], ECR, and hand grip.  BLE 5/5.     Reflexes: Deferred     Sensory:           UEs LEs     R L R L   Light touch WNL WNL WNL WNL      Cerebellar/Coordination/Gait:  Deferred       Assessment/Plan:       Assessment: Ryan Davenport is a 63 y.o. 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 Patient is started on Keppra 1 g and vimpat 200 mg twice daily.     ACTIVE PROBLEMS:  #Left parasagittal focal seizures  Initial presentation with 1 week of right upper extremity and facial tonic followed by clonic activity lasting less than 1 minute, though increasing in frequency up to 15 events per day,  s/p Keppra load 30 mg/kg x2 in the ED. With EEG showing left parasagittal focal seizures, constitutes left focal seizures correlating with aforementioned semiology, though etiology remains unclear. Patient continued to seize, so Vimpat was started 4/7 PM. He also got ativan  2 mg and 1 mg overnight for continuing seizures. With patient's history of HIV (previously AIDS, though CD4 count recovered to 694/7), and history of classically immunocompromised infections (CMV retinitis, molluscum contagiosum, secondary syphilis), could raise suspicion for new CNS infectious etiology.  However, MRI brain without contrast not showing any focal lesions or bleeds to explain the current symptoms and no systemic signs to explain meningitis or infectious encephalitis.  No headaches, strokes, bleeds on MRI making CVST significantly less likely. We will plan for lumbar puncture and repeat MRI brain with contrast to assess for etiologies.    ASM regimen: (avoiding DDI with Biktarvy)  - Keppra 1 g twice daily  - Vimpat 200 mg twice daily  - Depakote 250mg  q6h    - Next line:  Onfi    Plan:   - MRI brain with and without contrast  - Lumbar puncture   - Acyclovir 780 mg every 8 hours for HSV ppx    Pending Labs       Order Current Status    CMV PCR, Qualitative, Not Blood In process    Cryptococcal Antigen, CSF In process    EBV PCR, Qualitative, Not Blood In process    Encephalopathy, Autoimmune/Paraneoplastic Eval, CSF In process    Enterovirus PCR, CSF In process    HHV-6 PCR, Other In process    HSV 1 AND 2 BY PCR, NOT BLOOD In process    HSV PCR In process    Varicella (VZV) PCR, Other In process    CSF Culture Preliminary result            STABLE PROBLEMS:  #Hx DVT/PE  Chart history of DVT PE 2012, on long-term anticoagulation as per hematology.  Patient states last took Eliquis 03/29/24 afternoon.  - Holding home Eliquis given LP as above     #T2DM  History of poorly controlled type 2 diabetes with A1c 12 (increased from 6.4% 3 months prior).  Initial presentation with hyperglycemia to outside hospital, 4/5 to 500s, subsequently improved.  On 12 units Lantus nightly, recently restarted 4/1, and 5 units lispro 3 times daily AC.  Had insurance issues filling his insulin prescription recently which explains the poor blood sugar control.  Plan to restart Lantus as previously with aggressive sliding scale and up titration of mealtime pending sliding requirements.  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 HIV RNA was detected, but absolute count is 690.  Medical history complicated by multiple infections including right CMV retinitis, molluscum contagiosum, and secondary syphilis, all status post treatment.  On Biktarvy at home.   - Continue Biktarvy daily    #Depression  -Continue home sertraline 50 mg daily     #CAD s/p stent 2021 #HLD  History of CAD ISO HLD s/p stent 2021, though not previously on aspirin at home.  Per wife, not taking an aspirin.  -Consider inpatient vs outpatient cardiology follow up to determine if needing ASA  -Restart home atorvastatin 40 mg daily, Metoprolol 25 mg qd     #HTN  - Lisinopril 10 mg every day, Lasix 20 qd     #GERD  -Start pharmacy equivalent pantoprazole 40 mg daily (home omeprazole 40 mg daily)     #Hx Gout  -Hold home colchicine as needed     Discharge Planning:   - Case management: N/A  -  Social work: N/A  - PT: consulted, TBD  - OT: consulted, TBD  - SLP: N/A  - Expected Discharge Disposition: TBD.       Checklist:  - Diet: Regular diet  - IV fluids: no  - Bowel Regimen: No indication for a bowel regimen at this time (reason: PRN)  - GI PPX: No GI indications  - DVT PPX:  Holding given consideration for LP  - Lines/Access: PIV x1.    - Foley: No     This patient was seen and discussed with Dr. Parker Bollard, who agrees with the above assessment and plan.       Please page Neurology Team B at [phone] for any questions/concerns.    Jiwoo Kim, MS4  I attest that I have reviewed the medical student note and that the components of the history of the present illness, the physical exam, and the assessment and plan documented were performed by me or were performed in my presence by the student where I verified the documentation and performed (or re-performed) the exam and medical decision making.   Brady Cagey MD  Resident Physician PGY3  [place] Department of Neurology        Data Review:     Contact Information:  Family contact: Teren Zurcher (Mother, [phone])  PCP: Consuelo Denmark, PA    Medications:  Scheduled medications:    acyclovir  10 mg/kg (Ideal) Intravenous Q8H SCH    [Provider Hold] apixaban  5 mg Oral BID    atorvastatin  40 mg Oral Daily    bictegrav-emtricit-tenofov ala  1 tablet Oral Daily    empagliflozin  25 mg Oral Daily    furosemide  20 mg Oral Daily    insulin glargine  12 Units Subcutaneous Nightly    insulin lispro  0-20 Units Subcutaneous ACHS    lacosamide  200 mg Intravenous BID    levETIRAcetam  1,500 mg Intravenous Q12H SCH    lisinopril  10 mg Oral Daily    magnesium oxide  400 mg Oral BID    metoPROLOL succinate  25 mg Oral Daily    pantoprazole  40 mg Oral Daily before breakfast    polyethylene glycol  17 g Oral Daily    senna  1 tablet Oral Nightly    sertraline  50 mg Oral Daily    sodium chloride 0.9%  250 mL Intravenous Q8H SCH    sodium chloride 0.9%  250 mL Intravenous Q8H SCH     Continuous infusions:   PRN medications: acetaminophen, calcium carbonate, cyclobenzaprine, dextrose in water, glucagon, glucose, hydrOXYzine, lidocaine, methocarbamol    24 hour vital signs:  Temp:  [36.3 ??C (97.3 ??F)-37 ??C (98.6 ??F)] 36.4 ??C (97.5 ??F)  Heart Rate:  [79-96] 96  SpO2 Pulse:  [83] 83  Resp:  [16-19] 18  BP: (100-142)/(58-94) 139/75  MAP (mmHg):  [70-106] 95  SpO2:  [93 %-100 %] 98 %    Ins and Outs:  No intake/output data recorded.    Laboratory values:  All Labs Last 24hrs:   Recent Results (from the past 24 hours)   POCT Glucose    Collection Time: 03/30/24  3:48 PM   Result Value Ref Range    Glucose, POC 79 70 - 179 mg/dL   POCT Glucose    Collection Time: 03/30/24  8:16 PM   Result Value Ref Range    Glucose, POC 203 (H) 70 - 179 mg/dL   POCT Glucose    Collection Time:  03/30/24 10:24 PM   Result Value Ref Range    Glucose, POC 238 (H) 70 - 179 mg/dL   POCT Glucose    Collection Time: 03/30/24 11:06 PM   Result Value Ref Range    Glucose, POC 210 (H) 70 - 179 mg/dL   POCT Glucose    Collection Time: 03/31/24  8:05 AM   Result Value Ref Range    Glucose, POC 113 70 - 179 mg/dL   POCT Glucose    Collection Time: 03/31/24 12:16 PM   Result Value Ref Range    Glucose, POC 169 70 - 179 mg/dL       Imaging:  Pertinent imaging discussed in the A/P section

## 2024-03-31 NOTE — Unmapped (Signed)
 PHYSICAL THERAPY  Evaluation (03/31/24 1041)          Patient Name:  Ryan Davenport       Medical Record Number: [id number]   Date of Birth: [birth year]  Sex: Male        Post-Discharge Physical Therapy Recommendations:  PT Post Acute Discharge Recommendations: 3x weekly, Low intensity (P)   Equipment Recommendation  PT DME Recommendations: Defer to post acute          Treatment Diagnosis: Abnormalities of gait and mobility, Difficulty in walking, Unsteadiness on feet        Activity Tolerance: Tolerated treatment well     ASSESSMENT  Problem List: Impaired balance, Decreased endurance, Decreased mobility, Gait deviation, Fall risk, Impaired ADLs      Assessment : 63 y.o. male h/o HIV, IDDM (uncontrolled) here with new onset RUE focal seizures (15/day), unclear etiology.  Patient presents in bed and demonstrated the ability to transfer and take several steps without device with min assist, limited primarily by decreased alertness and VEEG lines/leads.  Patient required moderate encouragement to participate in session.  Significantly improved bilat hand wringing motions per observation and pt report.   Anticipate rapid mobility progression back to functional indep as VEEG discontinued and alertness improves, with continued acute and post acute care Physical Therapy follow up.   Please see Today's Intervention and Daily Intervention sections for detailed current mobility status and additional session detail.  After a review of the personal factors, comorbidities, clinical presentation, and examination of the number of affected body systems, the patient presents as a moderate complexity case.      Today's Interventions: Mobility assessment completed.  Therex/Theract: side steps at bedside.  Pt Education: POC, anticipated course of cont recovery, importance of only mobilizing with staff assist at this time to maximize safety, .  Patient in agreement with plan.  All questions answered.  Patient verbalized understanding and stated intention to comply with recommendations.                          PLAN  Planned Frequency of Treatment: Plan of Care Initiated: 03/31/24  1-2x per day Weekly Frequency: 3-4 days per week  Planned Treatment Duration: 04/07/24     Planned Interventions: Education (Patient/Family/Caregiver), Gait training, Home exercise program, Self-care / Home Management training, Therapeutic Exercise     Goals:   Patient and Family Goals: to return to indep mobility     SHORT GOAL #1: pt will perform all transfers indep using LRAD               Time Frame : 1 week  SHORT GOAL #2: pt will ambulate 218ft indep using LRAD              Time Frame : 1 week  SHORT GOAL #3: pt will up/dwn 4 steps with single rail indep, to increase community and residential access              Time Frame : 1 week                       Long Term Goal #1: pt will score 24/24 on AMPAC without device  Time Frame: 2 months     Prognosis:  Good  Positive Indicators: high PLOF, functional strength x 4 extremities  Barriers to Discharge: Impaired Balance, Gait instability, Endurance deficits, Inability to safely perform ADLS, Cognitive deficits, Severity of deficits  SUBJECTIVE  Communication Preference: Verbal     Patient reports: agreeable to session  Pain Comments: denies at rest        Prior Functional Status: pt reporting indep all mobility without device at baseline  Equipment available at home: None        Past Medical History:   Diagnosis Date    Asthma     Asymptomatic human immunodeficiency virus (HIV) infection status 04/09/2013    Diagnosed in 2005 with a CD4 of 32 and on at least one prior regimen on a study, then switched to Truvada with ritonavir boosted atazanavir. Changed to Truvada with boosted darunavir secondary to reflux symptoms. Has remained undetectable for several years with CD4 count greater than 500.      CAD (coronary artery disease)     Depression     Diabetes mellitus     DVT (deep venous thrombosis) Eczema     Enlargement of liver     GERD (gastroesophageal reflux disease)     HIV (human immunodeficiency virus infection)     Hyperlipidemia     Hypertension     Myocardial infarction     Obesity     Pulmonary embolism 2012    Sleep apnea             Social History     Tobacco Use    Smoking status: Never     Passive exposure: Never    Smokeless tobacco: Never   Substance Use Topics    Alcohol use: No     Alcohol/week: 0.0 standard drinks of alcohol       Past Surgical History:   Procedure Laterality Date    PR COLONOSCOPY FLX DX W/COLLJ SPEC WHEN PFRMD N/A 11/11/2013    Procedure: COLONOSCOPY, FLEXIBLE, PROXIMAL TO SPLENIC FLEXURE; DIAGNOSTIC, W/WO COLLECTION SPECIMEN BY BRUSH OR WASH;  Surgeon: Elfrieda Grise, MD;  [facility];  Service: Gastroenterology    PR UPPER GI ENDOSCOPY,DIAGNOSIS N/A 04/01/2019    Procedure: UGI ENDO, INCLUDE ESOPHAGUS, STOMACH, & DUODENUM &/OR JEJUNUM; DX W/WO COLLECTION SPECIMN, BY BRUSH OR WASH;  Surgeon: Dorthy Gavia, MD;  [facility];  Service: Gastroenterology    SKIN BIOPSY               Family History   Problem Relation Age of Onset    Diabetes Mother     Heart disease Mother     Hypertension Mother     Arthritis Mother     Aneurysm Father     Skin cancer Neg Hx     Melanoma Neg Hx     Basal cell carcinoma Neg Hx     Squamous cell carcinoma Neg Hx         Allergies: Latex, Shellfish containing products, Metformin, and Penicillins                  Objective Findings  Precautions / Restrictions  Precautions: Seizure precautions, Falls precautions  Weight Bearing Status: Non-applicable  Required Braces or Orthoses: Non-applicable        Equipment / Environment: EEG/VEEG, Telemetry, Vascular access (PIV, TLC, Port-a-cath, PICC)     Vitals/Orthostatics : asy     Living Situation  Living Environment: Apartment  Lives With: Alone  Home Living: One level home, Tub/shower unit, Standard height toilet (denies STE)      Cognition: WFL  Cognition comment: decreased alertness, sleepy, however participated fully in session  Visual/Perception:  (denies acute change/deficit)  Hearing: HOH at baseline, however able to have full conversation at normal volumes without difficulty     Skin Inspection: Intact where visualized     Upper Extremities  UE comment: WFL    Lower Extremities  LE comment: WFL                           Bed Mobility comments: supine to/from sit: indep , required inceased time     Transfer comments: sit to/from stand:min assist without device      Gait Level of Assistance: Minimal assist, patient does 75% or more  Gait Assistive Device:  (none, HHA provided)  Gait Distance Ambulated (ft): 3 ft  Skilled Treatment Performed: posterior LOB, limited by VEEG lines/leads and decreased alertness     Stairs: NT                 Patient at end of session: All needs in reach, Alarm activated, In bed    Physical Therapy Session Duration  PT Individual [mins]: 25 (total)          AM-PAC-6 click  Help currently need turning over In bed?: None - Modified Independent/Independent  Help currently needed sitting down/standing up from chair with arms? : A Little - Minimal/Contact Guard Assist/Supervision  Help currently needed moving from supine to sitting on edge of bed?: None - Modified Independent/Independent  Help currently needed moving to and from bed from wheelchair?: A Little - Minimal/Contact Guard Assist/Supervision  Help currently needed walking in a hospital room?: A Little - Minimal/Contact Guard Assist/Supervision  Help currently needed climbing 3-5 steps with railing?: A Little - Minimal/Contact Guard Assist/Supervision    Basic Mobility Score 6 click: 20    6 click Score (in points): % of Functional Impairment, Limitation, Restriction  6: 100% impaired, limited, restricted  7-8: At least 80%, but less than 100% impaired, limited restricted  9-13: At least 60%, but less than 80% impaired, limited restricted  14-19: At least 40%, but less than 60% impaired, limited restricted  20-22: At least 20%, but less than 40% impaired, limited restricted  23: At least 1%, but less than 20% impaired, limited restricted  24: 0% impaired, limited restricted    'AM-PAC' forms are Copyright protected by The Trustees of [facility]         I attest that I have reviewed the above information.  Signed: Timmie Footman, PT  Filed 03/31/2024

## 2024-03-31 NOTE — Unmapped (Signed)
 Patient alert and orientedx3-4. On vEEG and telemetry. MD notified about seizure-like activity throughout shift, Right arm pain and then shaking lasting about 30 seconds each, medications given as ordered. Takes medications whole. IV flushed and patent. Falls, seizure and delirium precautions maintained. Up x1 assistance to the bathroom. Large incontinence episode this morning, partial bath given. Pending MRI. Will continue to monitor.         Problem: Adult Inpatient Plan of Care  Goal: Plan of Care Review  Outcome: Progressing  Goal: Patient-Specific Goal (Individualized)  Outcome: Progressing  Goal: Absence of Hospital-Acquired Illness or Injury  Outcome: Progressing  Intervention: Identify and Manage Fall Risk  Recent Flowsheet Documentation  Taken 03/30/2024 2000 by Remigio Carl, RN  Safety Interventions:   bed alarm   fall reduction program maintained   low bed  Intervention: Prevent Skin Injury  Recent Flowsheet Documentation  Taken 03/30/2024 2000 by Remigio Carl, RN  Positioning for Skin: Supine/Back  Intervention: Prevent and Manage VTE (Venous Thromboembolism) Risk  Recent Flowsheet Documentation  Taken 03/31/2024 0000 by Remigio Carl, RN  Anti-Embolism Device Type: SCD, Knee  Anti-Embolism Device Status: Refused  Anti-Embolism Device Location: BLE  Taken 03/30/2024 2200 by Remigio Carl, RN  Anti-Embolism Device Type: SCD, Knee  Anti-Embolism Device Status: Refused  Anti-Embolism Device Location: BLE  Taken 03/30/2024 2000 by Remigio Carl, RN  Anti-Embolism Device Type: SCD, Knee  Anti-Embolism Device Status: Refused  Anti-Embolism Device Location: BLE  Goal: Optimal Comfort and Wellbeing  Outcome: Progressing  Goal: Readiness for Transition of Care  Outcome: Progressing  Goal: Rounds/Family Conference  Outcome: Progressing     Problem: Latex Allergy  Goal: Absence of Allergy Symptoms  Outcome: Progressing     Problem: Fall Injury Risk  Goal: Absence of Fall and Fall-Related Injury  Outcome: Progressing  Intervention: Promote Injury-Free Environment  Recent Flowsheet Documentation  Taken 03/30/2024 2000 by Remigio Carl, RN  Safety Interventions:   bed alarm   fall reduction program maintained   low bed     Problem: Seizure, Active Management  Goal: Absence of Seizure/Seizure-Related Injury  Outcome: Progressing

## 2024-03-31 NOTE — Unmapped (Signed)
 Care Management  Initial Transition Planning Assessment              General  Care Manager assessed the patient by : Discussion with Clinical Care team, Medical record review  Orientation Level: Disoriented to situation  Reason for referral: Discharge Planning, Stroke Eval    Type of Residence: Mailing Address:  [address]  [place] [address]  Contacts: Accompanied by: Alone          Medical Provider(s): Consuelo Denmark, PA  Reason for Admission: Admitting Diagnosis:  Spasm [R25.2]  Past Medical History:   has a past medical history of Asthma, Asymptomatic human immunodeficiency virus (HIV) infection status (04/09/2013), CAD (coronary artery disease), Depression, Diabetes mellitus, DVT (deep venous thrombosis), Eczema, Enlargement of liver, GERD (gastroesophageal reflux disease), HIV (human immunodeficiency virus infection), Hyperlipidemia, Hypertension, Myocardial infarction, Obesity, Pulmonary embolism (2012), and Sleep apnea.  Past Surgical History:   has a past surgical history that includes pr colonoscopy flx dx w/collj spec when pfrmd (N/A, 11/11/2013); Skin biopsy; and pr upper gi endoscopy,diagnosis (N/A, 04/01/2019).   Previous admit date: 10/10/2022    Primary Insurance- Payor: Advertising copywriter MEDICARE ADV / Plan: UNITED HEALTHCARE DUAL COMPLETE HMO / Product Type: *No Product type* /   Secondary Insurance - Secondary Insurance  MEDICAID [place]    Preferred Pharmacy - [facility] [phone] - [place], [place] - [address] AT [facility] OF [address]  [facility]  [facility] [phone] - [place], [place] - [address] AT [address]  [phone] - [place], [place] - [address]    Transportation home: Private vehicle      Contact/Decision Maker  Extended Emergency Contact Information  Primary Emergency Contact: Delmundo,Barbara  Address: unknown   [place]  Home Phone: [phone]  Relation: Mother    Legal Next of Kin / Guardian / POA / Advance Directives     HCDM (patient stated preference): Jakayden, Cancio - Mother - [phone]         Health Care Decision Maker [HCDM] (Medical & Mental Health Treatment)  Healthcare Decision Maker: HCDM documented in the HCDM/Contact Info section.    Advance Directive (Mental Health Treatment)  Does patient have an advance directive covering mental health treatment?: Patient does not have advance directive covering mental health treatment.  Reason patient does not have an advance directive covering mental health treatment:: HCDM documented in the HCDM/Contact Info section.    Readmission Information    Have you been hospitalized in the last 30 days?: No  Name of Hospital: [facility]  Were you being cared for at a skilled nursing facility:: No           Did the following happen with your discharge?         Patient Information  Lives with: Alone    Type of Residence: Private residence             Support Systems/Concerns: Family Members         Home Care services in place prior to admission?: No             Currently receiving outpatient dialysis?: No       Financial Information       Need for financial assistance?: No       Social Determinants of Health  Social  Drivers of Health     Food Insecurity: No Food Insecurity (03/27/2024)    Received from [facility]    Hunger Vital Sign     Worried About Running Out of Food in the Last Year: Never true     Ran Out of Food in the Last Year: Never true   Tobacco Use: Low Risk  (03/31/2024)    Patient History     Smoking Tobacco Use: Never     Smokeless Tobacco Use: Never     Passive Exposure: Never   Transportation Needs: Unknown (03/27/2024)    Received from [facility] - Transportation     Lack of Transportation (Medical): No     Lack of Transportation (Non-Medical): Patient unable to answer   Alcohol Use: Not At Risk (07/31/2023)    Alcohol Use     How often do you have a drink containing alcohol?: Never     How many drinks containing alcohol do you have on a typical day when you are drinking?: 1 - 2     How often do you have 5 or more drinks on one occasion?: Never   Housing: Low Risk  (03/09/2024)    Housing     Within the past 12 months, have you ever stayed: outside, in a car, in a tent, in an overnight shelter, or temporarily in someone else's home (i.e. couch-surfing)?: No     Are you worried about losing your housing?: No   Physical Activity: Not on file   Utilities: Not At Risk (03/27/2024)    Received from [facility] Utilities     Threatened with loss of utilities: No   Stress: Not on file   Interpersonal Safety: Not At Risk (03/09/2024)    Interpersonal Safety     Unsafe Where You Currently Live: No     Physically Hurt by Anyone: No     Abused by Anyone: No   Substance Use: Low Risk  (07/31/2023)    Substance Use     In the past year, how often have you used prescription drugs for non-medical reasons?: Never     In the past year, how often have you used illegal drugs?: Never     In the past year, have you used any substance for non-medical reasons?: No   Intimate Partner Violence: Not At Risk (03/27/2024)    Received from [facility]    Humiliation, Afraid, Rape, and Kick questionnaire     Fear of Current or Ex-Partner: No     Emotionally Abused: No     Physically Abused: No     Sexually Abused: No   Social Connections: Unknown (03/27/2024)    Received from [facility]    Social Connection and Isolation Panel [NHANES]     Frequency of Communication with Friends and Family: Patient declined     Frequency of Social Gatherings with Friends and Family: More than three times a week     Attends Religious Services: 1 to 4 times per year     Active Member of [employer] or Organizations: Patient declined     Attends Banker Meetings: Patient declined     Marital Status: Never married   Physicist, medical Strain: Low Risk  (03/31/2024)    Overall Financial Resource Strain (CARDIA) Difficulty of Paying Living Expenses: Not hard at all   Depression: Not at risk (02/01/2023)    PHQ-2     PHQ-2 Score: 2  Internet Connectivity: Not on file   Health Literacy: Not on file       Complex Discharge Information                 Interventions:       Discharge Needs Assessment  Concerns to be Addressed: other (see comments) (tbd)    Clinical Risk Factors:      Barriers to taking medications: No    Prior overnight hospital stay or ED visit in last 90 days: Yes                        Discharge Facility/Level of Care Needs:      Readmission  Risk of Unplanned Readmission Score: UNPLANNED READMISSION SCORE: 22.72%  Predictive Model Details          23%  Factor Value    Calculated 03/31/2024 16:01 29% Number of active inpatient medication orders 47    [place] Risk of Unplanned Readmission Model 20% Number of ED visits in last six months 4     10% Charlson Comorbidity Index 10     8% ECG/EKG order present in last 6 months     7% Diagnosis of electrolyte disorder present     6% Imaging order present in last 6 months     5% Latest hemoglobin low (12.8 g/dL)     4% Age 63     4% Active anticoagulant inpatient medication order present     4% Latest creatinine high (1.21 mg/dL)     2% Future appointment scheduled     1% Current length of stay 1.28 days     1% Active ulcer inpatient medication order present      Readmitted Within the Last 30 Days? (No if blank)        Discharge Plan       Expected Discharge Date: 04/03/2024    Expected Transfer from Critical Care:                 Initial Assessment complete?: Yes

## 2024-03-31 NOTE — Unmapped (Signed)
 DAILY REAL TIME INTERIM LONGTERM VIDEO EEG MONITORING NOTE    Identifying Information   NAME: Ryan Davenport    MRN: [id number]   DOB: Jul 09, 1961      LOC: 6126/6126-01    HISTORY: 63 y.o. male with new onset of episodes of pain and shaking of the right arm     INDICATION:  Seizures     PATIENT STATE: Awake, Drowsy, and Sleep     Study Information  EEG Start:   March 30, 2024 at 1055  EEG End: March 30, 2024  at     [facility] 4/7 at 1055 to 4/7 at 2100  Ryan Davenport 4/7/at 2100 to TBD     EEG TECHNICAL DESCRIPTION   Conditions of Recording:  Continuous EEG with simultaneous video recording was performed utilizing 21 active electrodes placed according to the international 10-20 system.  The study was recorded digitally with a bandpass of 1-70Hz  and a sampling rate of 200Hz  and was reviewed with the possibility of multiple reformatting.  The study was digitally processed with potential spike and seizure events identified for physician analysis and review.  Patient recognized events were identified by a push button marker and reviewed by the physician.   Simultaneous video was reviewed for all patient events.    EEG DESCRIPTION:       Initial Background Activity (see Daily Interpretation below for description of background changes that occur as the recording progresses)     Frequency (maximal in waking or arousal):  During waking there is 9 Hz posterior dominant alpha activity with appropriate organization and anterior-posterior voltage and frequency gradients.        PDR:  Present    Asymmetry/ Focal slowing: No    Amplitude: Normal (20 - 150 uV)    Continuity:  Continuous     Sleep Activity/ State Change: State change present with normal N2 sleep (sleep spindles and vertex waves)    Reactivity: Present     Periodic/ Rhythmic Patterns/ Epileptiform Activity:  No.    Seizures: Yes. There were frequent electroclinical seizures starting over the left parasagittal region and consisting of rhythmic beta activity evolving to slower frequency low amplitude spike discharges, duration 40 seconds.    Clinically the patient cried out and there is tonic flexion of the RUE followed by rapid clonic movements.  Initially seizures were 3-4 per hour, decreasing to about 1 per hour after 1500 on 4/7      Patient Events: No.    DAY 1:  EEG INTERPRETATION:  March 30, 2024 at  1055  to March 30, 2024 at 2015 [facility])     Relevant Medications: Keppra, Vimpat 300 mg load at 1844 on 4/7    Abnormal due to:     There were frequent electroclinical seizures starting over the left parasagittal region and consisting of rhythmic beta activity evolving to slower frequency low amplitude spike discharges, duration 40-60 seconds.  Clinically the patient cried out and there is tonic flexion of the RUE followed by rapid clonic movements.   Initially 3-4 per hour decreasing to about 1 per hour after 1500      Disconnected for transfer 1730 to 1820       DAY 2:  EEG INTERPRETATION:  March 30, 2024 at  1055  to March 30, 2024 at 2015 [facility])     Relevant Medications: Keppra, Vimpat 300 mg load at 1844 on 4/7    Abnormal due to:     There were frequent electroclinical seizures starting  over the left parasagittal region and consisting of rhythmic beta activity evolving to slower frequency low amplitude spike discharges, duration 40-60 seconds.  Clinically the patient cried out and there is tonic flexion of the RUE followed by rapid clonic movements.   Initially 3-4 per hour decreasing to about 1 per hour after 1500      Disconnected for transfer 1730 to 1820       DAY 2:  EEG INTERPRETATION:  March 30, 2024 at  2015  to March 31, 2024 at 1000 (Ryan Davenport)     Relevant Medications: Keppra, Vimpat, received 2 mg lorazepam  Abnormal due to:     Backgrouns similar to prior days, there was one seizure at 21:15 hrs that lasted few seconds.  Patient was given lorazepam 2 mg and background with no seizures after until 2 am when discharges are seen over the left parasagittally region that are persistent. These discharges continued until 6 am to then resolving and re starting at 7 am until 8:41 hrs on April 8th.  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  No more discharges are seen after 8:41 hrs and no seizures are seen after.        CLINICAL CORRELATION/ SUMMARY     These findings are consistent with frequent focal electroclinical seizures arising from the left parasagittal region with clinical correlate of right arm flexion and clonic activity.         EEG monitoring continues with a new EEG interpreting physician      Interpreting Attending: Benita Bramble, MD         2HELPS2B Seizure Risk Score  Clinical risk score based on EEG findings and clinical history of seizures to aid in determination of optimal duration of EEG monitoring for detection of electrographic seizures.  Score has not been validated in patients under age 67 or following cardiac arrest.    NOT APPLICABLE FOR THIS PATIENT     Add 1 point if known history of epilepsy or prior clinical seizure.      Risk Group     Seizure Risk   at 72 hours   Duration of Monitoring    Seizure risk < 5%     Duration of Monitoring    Seizure risk < 2%       Low Risk,   Score = 0     3.1%   1 Hour   3.3 Hours     Medium Risk,   Score = 1     12%   12 Hours   29 Hours     High Risk,   Score = 2 or greater                >25%   >24 Hours   >30 Hours   Struck et al JAMA Neurology, January 2020    2HELPS2B Seizure Risk Score is not appropriate when EEG monitoring is being performed for the following indications:  Treatment of status epilepticus, seizures already documented on EEG or Ictal Interictal continuum pattern (IIC)  Monitoring sedation/ burst suppression for management of intracranial pressure and/or paralyzed patients  Diagnostic evaluation of transient episodes concerning for possible seizures (spell capture)   Patients s/p cardiac arrest undergoing targeted temperature management      Cherrie Cornwall al. American Clinical Neurophysiology Society's Standardized Critical Care EEG Terminology: 2021 Version. Journal of Clinical  Neurophysiology 38(1):p 1-29, January 2021.   ACNS STANDARDIZED ICU EEG NOMENCLATURE v ([email or web address])

## 2024-04-01 LAB — VALPROIC ACID LEVEL, TOTAL: VALPROIC ACID TOTAL: 36.6 ug/mL — ABNORMAL LOW (ref 50.0–100.0)

## 2024-04-01 MED ADMIN — clonazePAM (KlonoPIN) disintegrating tablet 0.5 mg: .5 mg | ORAL | @ 18:00:00

## 2024-04-01 MED ADMIN — valproate (DEPACON) 250 mg in sodium chloride (NS) 0.9 % 50 mL infusion: 250 mg | INTRAVENOUS | @ 01:00:00

## 2024-04-01 MED ADMIN — empagliflozin (JARDIANCE) tablet 25 mg: 25 mg | ORAL | @ 13:00:00

## 2024-04-01 MED ADMIN — sodium chloride 0.9% (NS) bolus 250 mL: 250 mL | INTRAVENOUS | @ 04:00:00 | Stop: 2024-05-02

## 2024-04-01 MED ADMIN — sertraline (ZOLOFT) tablet 50 mg: 50 mg | ORAL | @ 13:00:00

## 2024-04-01 MED ADMIN — cloBAZam (ONFI) split tablet 5 mg: 5 mg | ORAL | @ 03:00:00

## 2024-04-01 MED ADMIN — sodium chloride 0.9% (NS) bolus 250 mL: 250 mL | INTRAVENOUS | @ 18:00:00 | Stop: 2024-05-02

## 2024-04-01 MED ADMIN — sodium chloride 0.9% (NS) bolus 250 mL: 250 mL | INTRAVENOUS | @ 11:00:00 | Stop: 2024-05-02

## 2024-04-01 MED ADMIN — gadopiclenol (ELUCIREM,VUEWAY) injection 10 mL: 10 mL | INTRAVENOUS | Stop: 2024-04-01

## 2024-04-01 MED ADMIN — sodium chloride 0.9% (NS) bolus 250 mL: 250 mL | INTRAVENOUS | @ 08:00:00 | Stop: 2024-05-02

## 2024-04-01 MED ADMIN — acyclovir (ZOVIRAX) 780 mg in sodium chloride (NS) 0.9 % 250 mL IVPB: 10 mg/kg | INTRAVENOUS | @ 03:00:00

## 2024-04-01 MED ADMIN — acyclovir (ZOVIRAX) 780 mg in sodium chloride (NS) 0.9 % 250 mL IVPB: 10 mg/kg | INTRAVENOUS | @ 16:00:00

## 2024-04-01 MED ADMIN — metoPROLOL succinate (Toprol-XL) 24 hr tablet 25 mg: 25 mg | ORAL | @ 13:00:00

## 2024-04-01 MED ADMIN — guar gum (NUTRISOURCE) 1 packet: 1 | ORAL | @ 21:00:00

## 2024-04-01 MED ADMIN — furosemide (LASIX) tablet 20 mg: 20 mg | ORAL | @ 13:00:00

## 2024-04-01 MED ADMIN — bictegrav-emtricit-tenofov ala (BIKTARVY) 50-200-25 mg tablet 1 tablet: 1 | ORAL | @ 13:00:00

## 2024-04-01 MED ADMIN — insulin lispro (HumaLOG) injection 0-20 Units: 0-20 [IU] | SUBCUTANEOUS | @ 13:00:00

## 2024-04-01 MED ADMIN — lacosamide (VIMPAT) injection 200 mg: 200 mg | INTRAVENOUS | @ 13:00:00 | Stop: 2024-04-01

## 2024-04-01 MED ADMIN — sodium chloride 0.9% (NS) bolus 250 mL: 250 mL | INTRAVENOUS | @ 16:00:00 | Stop: 2024-05-02

## 2024-04-01 MED ADMIN — lacosamide (VIMPAT) injection 200 mg: 200 mg | INTRAVENOUS

## 2024-04-01 MED ADMIN — acyclovir (ZOVIRAX) 780 mg in sodium chloride (NS) 0.9 % 250 mL IVPB: 10 mg/kg | INTRAVENOUS | @ 09:00:00

## 2024-04-01 MED ADMIN — valproate (DEPACON) 250 mg in sodium chloride (NS) 0.9 % 50 mL infusion: 250 mg | INTRAVENOUS | @ 18:00:00 | Stop: 2024-04-01

## 2024-04-01 MED ADMIN — pantoprazole (Protonix) EC tablet 40 mg: 40 mg | ORAL | @ 13:00:00

## 2024-04-01 MED ADMIN — valproate (DEPACON) 250 mg in sodium chloride (NS) 0.9 % 50 mL infusion: 250 mg | INTRAVENOUS | @ 13:00:00 | Stop: 2024-04-01

## 2024-04-01 MED ADMIN — valproate (DEPACON) 250 mg in sodium chloride (NS) 0.9 % 50 mL infusion: 250 mg | INTRAVENOUS | @ 06:00:00 | Stop: 2024-04-01

## 2024-04-01 MED ADMIN — sodium chloride 0.9% (NS) bolus 250 mL: 250 mL | INTRAVENOUS | Stop: 2024-05-02

## 2024-04-01 MED ADMIN — levETIRAcetam (KEPPRA) injection 1,500 mg: 1500 mg | INTRAVENOUS | @ 13:00:00 | Stop: 2024-04-01

## 2024-04-01 MED ADMIN — clonazePAM (KlonoPIN) disintegrating tablet 0.5 mg: .5 mg | ORAL | @ 03:00:00

## 2024-04-01 MED ADMIN — levETIRAcetam (KEPPRA) injection 1,500 mg: 1500 mg | INTRAVENOUS

## 2024-04-01 MED ADMIN — lisinopril (PRINIVIL,ZESTRIL) tablet 10 mg: 10 mg | ORAL | @ 13:00:00

## 2024-04-01 MED ADMIN — cloBAZam (ONFI) split tablet 5 mg: 5 mg | ORAL | @ 13:00:00

## 2024-04-01 MED ADMIN — insulin glargine (LANTUS) injection 12 Units: 12 [IU] | SUBCUTANEOUS | @ 02:00:00

## 2024-04-01 MED ADMIN — clonazePAM (KlonoPIN) disintegrating tablet 0.5 mg: .5 mg | ORAL | @ 10:00:00

## 2024-04-01 MED ADMIN — atorvastatin (LIPITOR) tablet 40 mg: 40 mg | ORAL | @ 13:00:00

## 2024-04-01 MED ADMIN — sodium chloride 0.9% (NS) bolus 250 mL: 250 mL | INTRAVENOUS | @ 20:00:00 | Stop: 2024-05-02

## 2024-04-01 NOTE — Unmapped (Addendum)
 DAILY REAL TIME INTERIM LONGTERM VIDEO EEG MONITORING NOTE    Identifying Information   NAME: Ryan Davenport    MRN: [id number]   DOB: 05-04-1961      LOC: 6126/6126-01    HISTORY: 63 y.o. male with new onset of episodes of pain and shaking of the right arm     INDICATION:  Seizures     PATIENT STATE: Awake, Drowsy, and Sleep     Study Information  EEG Start:   March 30, 2024 at 1055  EEG End: March 30, 2024  at     [facility] 4/7 at 1055 to 4/7 at 2100  Ademide Schaberg 4/7/at 2100 to TBD     EEG TECHNICAL DESCRIPTION   Conditions of Recording:  Continuous EEG with simultaneous video recording was performed utilizing 21 active electrodes placed according to the international 10-20 system.  The study was recorded digitally with a bandpass of 1-70Hz  and a sampling rate of 200Hz  and was reviewed with the possibility of multiple reformatting.  The study was digitally processed with potential spike and seizure events identified for physician analysis and review.  Patient recognized events were identified by a push button marker and reviewed by the physician.   Simultaneous video was reviewed for all patient events.    EEG DESCRIPTION:       Initial Background Activity (see Daily Interpretation below for description of background changes that occur as the recording progresses)     Frequency (maximal in waking or arousal):  During waking there is 9 Hz posterior dominant alpha activity with appropriate organization and anterior-posterior voltage and frequency gradients.        PDR:  Present    Asymmetry/ Focal slowing: No    Amplitude: Normal (20 - 150 uV)    Continuity:  Continuous     Sleep Activity/ State Change: State change present with normal N2 sleep (sleep spindles and vertex waves)    Reactivity: Present     Periodic/ Rhythmic Patterns/ Epileptiform Activity:  No.    Seizures: Yes. There were frequent electroclinical seizures starting over the left parasagittal region and consisting of rhythmic beta activity evolving to slower frequency low amplitude spike discharges, duration 40 seconds.    Clinically the patient cried out and there is tonic flexion of the RUE followed by rapid clonic movements.  Initially seizures were 3-4 per hour, decreasing to about 1 per hour after 1500 on 4/7      Patient Events: No.    DAY 1:  EEG INTERPRETATION:  March 30, 2024 at  1055  to March 30, 2024 at 2015 [facility])     Relevant Medications: Keppra, Vimpat 300 mg load at 1844 on 4/7    Abnormal due to:     There were frequent electroclinical seizures starting over the left parasagittal region and consisting of rhythmic beta activity evolving to slower frequency low amplitude spike discharges, duration 40-60 seconds.  Clinically the patient cried out and there is tonic flexion of the RUE followed by rapid clonic movements.   Initially 3-4 per hour decreasing to about 1 per hour after 1500      Disconnected for transfer 1730 to 1820       DAY 2:  EEG INTERPRETATION:  March 30, 2024 at  1055  to March 30, 2024 at 2015 [facility])     Relevant Medications: Keppra, Vimpat 300 mg load at 1844 on 4/7    Abnormal due to:     There were frequent electroclinical seizures starting  over the left parasagittal region and consisting of rhythmic beta activity evolving to slower frequency low amplitude spike discharges, duration 40-60 seconds.  Clinically the patient cried out and there is tonic flexion of the RUE followed by rapid clonic movements.   Initially 3-4 per hour decreasing to about 1 per hour after 1500      Disconnected for transfer 1730 to 1820       DAY 3:  EEG INTERPRETATION:  March 30, 2024 at  2015  to March 31, 2024 at 1000 (Broxton Broady)     Relevant Medications: Keppra, Vimpat, received 2 mg lorazepam  Abnormal due to:     Background similar to prior days, there was one seizure at 21:15 hrs that lasted few seconds.  Patient was given lorazepam 2 mg and background with no seizures after until 2 am when discharges are seen over the left parasagittally region that are persistent. These discharges continued until 6 am to then resolving and re starting at 7 am until 8:41 hrs on April 8th.  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  No more discharges are seen after 8:41 hrs and no seizures are seen after.        DAY 4:  EEG INTERPRETATION:  March 31, 2024 at  1000  to April 01, 2024 at 0800 (Shemekia Patane)     Relevant Medications: Keppra, Vimpat, Valproate     Abnormal due to:     Background similar to prior days and on April 8 at 14:55 hrs while the resident was at the bedside examining the patient. The patient had myoclonic movements on the right hand for what Valproate load was given.  EEG has muscle artifact obscuring the background but there are high amplitude discharges seen that back log with the myoclonic movement.    Seizure lasted few sec, no more seizures or event buttons were seen after       CLINICAL CORRELATION/ SUMMARY     These findings are consistent with frequent focal electroclinical seizures arising from the left parasagittal region with clinical correlate of right arm flexion and clonic activity that improved in frequency. Last seizure was seen on 14:55 hrs on April 8 characterized by myoclonic movements of few seconds.      Interpreting Attending: Benita Bramble, MD         2HELPS2B Seizure Risk Score  Clinical risk score based on EEG findings and clinical history of seizures to aid in determination of optimal duration of EEG monitoring for detection of electrographic seizures.  Score has not been validated in patients under age 75 or following cardiac arrest.    NOT APPLICABLE FOR THIS PATIENT     Add 1 point if known history of epilepsy or prior clinical seizure.      Risk Group     Seizure Risk   at 72 hours   Duration of Monitoring    Seizure risk < 5%     Duration of Monitoring    Seizure risk < 2% Low Risk,   Score = 0     3.1%   1 Hour   3.3 Hours     Medium Risk,   Score = 1     12%   12 Hours   29 Hours     High Risk,   Score = 2 or greater                >  25%   >24 Hours   >30 Hours   Avi Body al JAMA Neurology, January 2020    2HELPS2B Seizure Risk Score is not appropriate when EEG monitoring is being performed for the following indications:  Treatment of status epilepticus, seizures already documented on EEG or Ictal Interictal continuum pattern (IIC)  Monitoring sedation/ burst suppression for management of intracranial pressure and/or paralyzed patients  Diagnostic evaluation of transient episodes concerning for possible seizures (spell capture)   Patients s/p cardiac arrest undergoing targeted temperature management      Cherrie Cornwall al. American Clinical Neurophysiology Society's Standardized Critical Care EEG Terminology: 2021 Version. Journal of Clinical Neurophysiology 38(1):p 1-29, January 2021.   ACNS STANDARDIZED ICU EEG NOMENCLATURE v ([email or web address])

## 2024-04-01 NOTE — Unmapped (Signed)
 Neurology Inpatient Team B (NMB)  Daily Progress Note       Patient: Ryan Davenport  Code Status: Full Code  Level of Care: Acute floor status.   LOS: 2 days      Overnight Events & Subjective:     -On EEG, last seizure noted 4/8 PM. Added Onfi and Klonipin to ASM regimen. Patient is alert and oriented, but drowsy. Patient states that he has chronic watery diarrhea, but worsens with medication.   - LP with 13 nucleated cells, lymphocytic predominance  - ID consulted     Physical Exam:   Physical Exam:  General Appearance: in no acute distress, well appearing,, and alert,. Normal skin color, afebrile.  HEENT: Head is atraumatic and normocephalic. Sclera anicteric without injection. Oropharyngeal membranes are moist with no erythema or exudate.  Neck: Grossly normal range of motion.  Lungs: Normal work of breathing.  Heart: warm, well perfused.  Abdomen: Soft, nontender, nondistended.  Extremities: No clubbing, cyanosis, or edema.      NEUROLOGICAL EXAMINATION:      General/Mental Status:    Alert, conversant, able to follow conversation and interview.     Comprehension was intact.   Attention span and concentration were diminished.    Spontaneous speech was with mild dysarthria.       Cranial Nerves:   II, III - Pupils are equal and reactive to light b/l (direct and consensual reactions). No visual field defects.   III, IV, VI - Pursuit eye movements were uninterrupted with full range and without more than end-gaze nystagmus. Extra ocular movements are intact, no ptosis, no diplopia, no nystagmus.  V - Sensation of the face intact b/l to light touch in all three divisions of CNV.   VII - Face symmetrical at rest, no facial droop. Normal facial movements bilaterally including forehead, eye closure, and smile/grimace.  VIII - Hearing grossly intact to conversation.   IX and X - Palate movement is symmetric, no dysarthria.  XI - Full shoulder shrug bilaterally; normal tone and strength of sternocleidomastoid muscles bilaterally.  XII - No tongue atrophy, no tongue fasciculations; tongue protrudes midline, full range of movements of the tongue.     Motor:   Normal bulk. No tremors, myoclonus, or other adventitious movement.  Pronator drift is absent. Fasciculations not observed.      Muscle strength:  LUE 5/5  RUE 1/5 at deltoid, bicep, tricep. 2/5 at [facility], ECR, and hand grip.  BLE 5/5.     Reflexes: Deferred     Sensory:           UEs LEs     R L R L   Light touch WNL WNL WNL WNL      Cerebellar/Coordination/Gait:  Deferred       Assessment/Plan:       Assessment: Ryan Davenport is a 63 y.o. 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     ACTIVE PROBLEMS:  #Left parasagittal focal seizures  Initial presentation with 1 week of right upper extremity and facial tonic followed by clonic activity lasting less than 1 minute, though increasing in frequency up to 15 events per day. With EEG showing left parasagittal focal seizures, constitutes left focal seizures correlating with aforementioned semiology, though etiology remains unclear. Patient continued to have frequent seizures, so Vimpat was started 4/7  PM, Depakote started 4/8 AM, Onfi and Klonopin started 4/8 PM.      With patient's history of HIV (previously AIDS, though CD4 count recovered to 694/7), and history of classically immunocompromised infections (CMV retinitis, molluscum contagiosum, secondary syphilis), could raise suspicion for new CNS infectious etiology.  However, MRI brain without contrast not showing any focal lesions or bleeds to explain the current symptoms and no systemic signs to explain meningitis or infectious encephalitis.  CSF showed elevated cell counts, predominantly lymphocytes raising suspicion for viral meningitis. ID is consulted for further infectious workup given history of HIV.     ASM regimen: (avoiding DDI with Biktarvy)  - Keppra 1 g twice daily  - Vimpat 200 mg twice daily  -- Depakote 250mg  q6h    -- Onfi 5 mg twice daily  -- Klonopin 0.5 mg q8h     Plan:   - MRI brain with and without contrast  - Acyclovir 780 mg every 8 hours for HSV ppx    Workup  -- Follow up CSF viral PCR   -- Follow up GIPP    Pending Labs       Order Current Status    CMV PCR, Qualitative, Not Blood In process    Cryptococcal Antigen, CSF In process    Cytology - CSF (Cerebrospinal Fluid) In process    EBV PCR, Qualitative, Not Blood In process    Encephalopathy, Autoimmune/Paraneoplastic Eval, CSF In process    Enterovirus PCR, CSF In process    HHV-6 PCR, Other In process    HSV 1 AND 2 BY PCR, NOT BLOOD In process    HSV PCR In process    Valproic Acid, Free, Serum In process    Varicella (VZV) PCR, Other In process    CSF Culture Preliminary result            STABLE PROBLEMS:  #Hx DVT/PE  Chart history of DVT PE 2012, on long-term anticoagulation as per hematology.  Patient states last took Eliquis 03/29/24 afternoon.  - Holding home Eliquis given LP as above     #T2DM  History of poorly controlled type 2 diabetes with A1c 12 (increased from 6.4% 3 months prior).  Initial presentation with hyperglycemia to outside hospital, 4/5 to 500s, subsequently improved.  On 12 units Lantus nightly, recently restarted 4/1, and 5 units lispro 3 times daily AC.  Had insurance issues filling his insulin prescription recently which explains the poor blood sugar control.  Plan to restart Lantus as previously with aggressive sliding scale and up titration of mealtime pending sliding requirements.  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 HIV RNA was detected, but absolute count is 690. Medical history complicated by multiple infections including right CMV retinitis, molluscum contagiosum, and secondary syphilis, all status post treatment. On Biktarvy at home.   - Continue Biktarvy daily  - Acyclovir 780 mg every 8 hours for HSV ppx    #Depression  -Continue home sertraline 50 mg daily     #CAD s/p stent 2021 #HLD  History of CAD ISO HLD s/p stent 2021, though not previously on aspirin at home.  Per wife, not taking an aspirin.  -Consider inpatient vs outpatient cardiology follow up to determine if needing ASA  -Restart home atorvastatin 40 mg daily, Metoprolol 25 mg qd     #HTN  -  Lisinopril 10 mg every day, Lasix 20 qd     #GERD  -Start pharmacy equivalent pantoprazole 40 mg daily (home omeprazole 40 mg daily)     #Hx Gout  -Hold home colchicine as needed     Discharge Planning:   - Case management: N/A  - Social work: N/A  - PT: consulted, TBD  - OT: consulted, TBD  - SLP: N/A  - Expected Discharge Disposition: TBD.       Checklist:  - Diet: Regular diet  - IV fluids: no  - Bowel Regimen: No indication for a bowel regimen at this time (reason: PRN)  - GI PPX: No GI indications  - DVT PPX:  Holding given consideration for LP  - Lines/Access: PIV x1.    - Foley: No     This patient was seen and discussed with Dr. Parker Bollard, who agrees with the above assessment and plan.       Please page Neurology Team B at [phone] for any questions/concerns.    Jiwoo Kim, MS4  I attest that I have reviewed the medical student note and that the components of the history of the present illness, the physical exam, and the assessment and plan documented were performed by me or were performed in my presence by the student where I verified the documentation and performed (or re-performed) the exam and medical decision making.   Brady Cagey MD  Resident Physician PGY3  [place] Department of Neurology        Data Review:     Contact Information:  Family contact: Koji Niehoff (Mother, [phone])  PCP: Consuelo Denmark, PA    Medications:  Scheduled medications:    acyclovir  10 mg/kg (Ideal) Intravenous Q8H SCH [Provider Hold] apixaban  5 mg Oral BID    atorvastatin  40 mg Oral Daily    bictegrav-emtricit-tenofov ala  1 tablet Oral Daily    cloBAZam  5 mg Oral BID    clonazePAM  0.5 mg Oral Q8H    empagliflozin  25 mg Oral Daily    furosemide  20 mg Oral Daily    guar gum  1 packet Oral TID    insulin glargine  12 Units Subcutaneous Nightly    insulin lispro  0-20 Units Subcutaneous ACHS    lacosamide  200 mg Intravenous BID    levETIRAcetam  1,500 mg Intravenous Q12H SCH    lisinopril  10 mg Oral Daily    metoPROLOL succinate  25 mg Oral Daily    pantoprazole  40 mg Oral Daily before breakfast    sertraline  50 mg Oral Daily    sodium chloride 0.9%  250 mL Intravenous Q8H SCH    sodium chloride 0.9%  250 mL Intravenous Q8H SCH    valproate sodium  250 mg Intravenous Q6H     Continuous infusions:   PRN medications: acetaminophen, calcium carbonate, cyclobenzaprine, dextrose in water, glucagon, glucose, hydrOXYzine, lidocaine, methocarbamol, polyethylene glycol, senna    24 hour vital signs:  Temp:  [36.4 ??C (97.5 ??F)-36.5 ??C (97.7 ??F)] 36.4 ??C (97.5 ??F)  Heart Rate:  [82-105] 82  Resp:  [13-20] 19  BP: (109-147)/(68-93) 136/80  MAP (mmHg):  [81-108] 97  SpO2:  [97 %-100 %] 100 %    Ins and Outs:  No intake/output data recorded.    Laboratory values:  All Labs Last 24hrs:   Recent Results (from the past 24 hours)   CSF Culture    Collection Time: 03/31/24  3:45 PM  Specimen: Lumbar Puncture; Cerebrospinal Fluid   Result Value Ref Range    CSF Culture NO GROWTH TO DATE     Gram Stain Result 1+ Polymorphonuclear leukocytes     Gram Stain Result No organisms seen    CSF Cell Count with Differential    Collection Time: 03/31/24  4:00 PM   Result Value Ref Range    Tube # CSF Tube 1     Color, CSF Colorless     Appearance, CSF Clear     Nucleated Cells, CSF 22 (H) <=5 ul    RBC, CSF 30 (H) <2 ul    Lymphs %, CSF 85.0 %    Mono/Macrophage %, CSF 15.0 %    #Cells counted for Diff 100    CSF protein    Collection Time: 03/31/24 4:00 PM   Result Value Ref Range    Protein, CSF 42 20 - 63 mg/dL   Glucose, CSF    Collection Time: 03/31/24  4:00 PM   Result Value Ref Range    Glucose, CSF 94 (H) 48 - 79 mg/dL   CSF Cell Count with Differential    Collection Time: 03/31/24  4:00 PM   Result Value Ref Range    Tube # CSF Tube 4     Color, CSF Colorless     Appearance, CSF Clear     Nucleated Cells, CSF 13 (H) <=5 ul    RBC, CSF <2 <2 ul    Lymphs %, CSF 85.0 %    Mono/Macrophage %, CSF 15.0 %    #Cells counted for Diff 100    POCT Glucose    Collection Time: 03/31/24  5:40 PM   Result Value Ref Range    Glucose, POC 152 70 - 179 mg/dL   Glucose, Random    Collection Time: 03/31/24  7:00 PM   Result Value Ref Range    Glucose 151 70 - 179 mg/dL   POCT Glucose    Collection Time: 03/31/24  8:16 PM   Result Value Ref Range    Glucose, POC 104 70 - 179 mg/dL   Valproic Acid Level    Collection Time: 04/01/24  6:13 AM   Result Value Ref Range    Valproic Acid, Total 36.6 (L) 50.0 - 100.0 ug/mL   POCT Glucose    Collection Time: 04/01/24  8:04 AM   Result Value Ref Range    Glucose, POC 179 70 - 179 mg/dL   POCT Glucose    Collection Time: 04/01/24 12:20 PM   Result Value Ref Range    Glucose, POC 90 70 - 179 mg/dL       Imaging:  Pertinent imaging discussed in the A/P section

## 2024-04-01 NOTE — Unmapped (Addendum)
 VENOUS ACCESS ULTRASOUND PROCEDURE NOTE    Indications:   Poor venous access.    The Venous Access Team has assessed this patient for the placement of a PIV. Ultrasound guidance was necessary to obtain access.     Procedure Details:  Identity of the patient was confirmed via name, medical record number and date of birth. The availability of the correct equipment was verified.    The vein was identified for ultrasound catheter insertion.  Field was prepared with necessary supplies and equipment.  Probe cover and sterile gel utilized.  Insertion site was prepped with chlorhexidine solution and allowed to dry.  The catheter extension was primed with normal saline. A(n) 20 gauge 1.75 catheter was placed in the R Forearm with 1attempt(s). See LDA for additional details.    Catheter aspirated, 1 mL blood return present. The catheter was then flushed with 10 mL of normal saline. Insertion site cleansed, and dressing applied per manufacturer guidelines. The catheter was inserted without difficulty by Despina Floro, RN.    Care RN was notified.     Thank you,     Despina Floro, RN Venous Access Team   [phone]     Workup / Procedure Time:  30 minutes    See images below:No picture available

## 2024-04-01 NOTE — Unmapped (Signed)
 Pt resting in bed, alert, oriented x3, moves all extremities. VEEG leads intact. Seizure precautions maintained. Ate 100% of dinner. Voids in urinal. IV meds given via PIV. No complaints offered. MRI pending. Side rails up and padded, call bell within reach. No falls during shift. Will continue to monitor.    Problem: Adult Inpatient Plan of Care  Goal: Plan of Care Review  Outcome: Progressing  Goal: Patient-Specific Goal (Individualized)  Outcome: Progressing: Pt will have no falls during this hospitalization.  Goal: Absence of Hospital-Acquired Illness or Injury  Outcome: Progressing  Intervention: Identify and Manage Fall Risk  Recent Flowsheet Documentation  Taken 03/31/2024 2000 by Cranford Dixons, Earnestine Shipp , RN  Safety Interventions:   bed alarm   fall reduction program maintained   lighting adjusted for tasks/safety   low bed   room near unit station   nonskid shoes/slippers when out of bed  Intervention: Prevent Skin Injury  Recent Flowsheet Documentation  Taken 03/31/2024 2200 by Cranford Dixons, Rashada Klontz , RN  Positioning for Skin: Supine/Back  Taken 03/31/2024 2000 by Cranford Dixons, Yan Pankratz , RN  Positioning for Skin: Supine/Back  Intervention: Prevent and Manage VTE (Venous Thromboembolism) Risk  Recent Flowsheet Documentation  Taken 04/01/2024 0200 by Cranford Dixons, Briell Paulette , RN  Anti-Embolism Device Type: SCD, Knee  Anti-Embolism Device Status: Refused  Anti-Embolism Device Location: BLE  Taken 04/01/2024 0000 by Cranford Dixons, Makar Slatter , RN  Anti-Embolism Device Type: SCD, Knee  Anti-Embolism Device Status: Refused  Anti-Embolism Device Location: BLE  Taken 03/31/2024 2200 by Cranford Dixons, Britnee Mcdevitt , RN  Anti-Embolism Device Type: SCD, Knee  Anti-Embolism Device Status: Refused  Anti-Embolism Device Location: BLE  Taken 03/31/2024 2000 by Jacolyn Matar , RN  Anti-Embolism Device Type: SCD, Knee  Anti-Embolism Device Status: Refused  Anti-Embolism Device Location: BLE  Goal: Optimal Comfort and Wellbeing  Outcome: Progressing  Goal: Readiness for Transition of Care  Outcome: Progressing  Goal: Rounds/Family Conference  Outcome: Progressing     Problem: Latex Allergy  Goal: Absence of Allergy Symptoms  Outcome: Progressing     Problem: Fall Injury Risk  Goal: Absence of Fall and Fall-Related Injury  Outcome: Progressing  Intervention: Promote Injury-Free Environment  Recent Flowsheet Documentation  Taken 03/31/2024 2000 by Bennet Brasil , RN  Safety Interventions:   bed alarm   fall reduction program maintained   lighting adjusted for tasks/safety   low bed   room near unit station   nonskid shoes/slippers when out of bed     Problem: Seizure, Active Management  Goal: Absence of Seizure/Seizure-Related Injury  Outcome: Progressing  Intervention: Prevent Seizure-Related Injury  Recent Flowsheet Documentation  Taken 03/31/2024 2000 by Cranford Dixons, Louise Victory , RN  Seizure Precautions:   activity supervised   side rails padded   clutter-free environment maintained     Problem: Self-Care Deficit  Goal: Improved Ability to Complete Activities of Daily Living  Outcome: Progressing

## 2024-04-01 NOTE — Unmapped (Signed)
 Pt alert/oriented. On vEEG, seizure monitoring and precautions. Several loose, watery bowel movements today. Ambulates to restroom, contact guard. No c/o pain. Reg diet, good intake. VSS. Skin dry and flaky. No further concerns at this time.     Problem: Adult Inpatient Plan of Care  Goal: Plan of Care Review  Outcome: Progressing  Goal: Patient-Specific Goal (Individualized)  Outcome: Progressing  Goal: Absence of Hospital-Acquired Illness or Injury  Outcome: Progressing  Intervention: Identify and Manage Fall Risk  Recent Flowsheet Documentation  Taken 04/01/2024 1400 by Onnie Bilis, RN  Safety Interventions:   bed alarm   fall reduction program maintained   low bed  Taken 04/01/2024 1200 by Onnie Bilis, RN  Safety Interventions:   bed alarm   fall reduction program maintained   low bed  Taken 04/01/2024 1000 by Onnie Bilis, RN  Safety Interventions:   bed alarm   fall reduction program maintained   low bed  Taken 04/01/2024 0800 by Yesly Gerety, RN  Safety Interventions:   bed alarm   fall reduction program maintained   low bed  Intervention: Prevent and Manage VTE (Venous Thromboembolism) Risk  Recent Flowsheet Documentation  Taken 04/01/2024 1400 by Calleen Alvis, RN  Anti-Embolism Device Type: SCD, Knee  Anti-Embolism Device Status: Refused  Anti-Embolism Device Location: BLE  Taken 04/01/2024 1200 by Anzel Kearse, RN  Anti-Embolism Device Type: SCD, Knee  Anti-Embolism Device Status: Refused  Anti-Embolism Device Location: BLE  Taken 04/01/2024 1000 by Pantera Winterrowd, RN  Anti-Embolism Device Type: SCD, Knee  Anti-Embolism Device Status: Refused  Anti-Embolism Device Location: BLE  Taken 04/01/2024 0800 by Kayne Yuhas, RN  Anti-Embolism Device Type: SCD, Knee  Anti-Embolism Device Status: Refused  Anti-Embolism Device Location: BLE  Goal: Optimal Comfort and Wellbeing  Outcome: Progressing  Goal: Readiness for Transition of Care  Outcome: Progressing  Goal: Rounds/Family Conference  Outcome: Progressing     Problem: Latex Allergy  Goal: Absence of Allergy Symptoms  Outcome: Progressing     Problem: Fall Injury Risk  Goal: Absence of Fall and Fall-Related Injury  Outcome: Progressing  Intervention: Promote Scientist, clinical (histocompatibility and immunogenetics) Documentation  Taken 04/01/2024 1400 by Onnie Bilis, RN  Safety Interventions:   bed alarm   fall reduction program maintained   low bed  Taken 04/01/2024 1200 by Onnie Bilis, RN  Safety Interventions:   bed alarm   fall reduction program maintained   low bed  Taken 04/01/2024 1000 by Aneya Daddona, RN  Safety Interventions:   bed alarm   fall reduction program maintained   low bed  Taken 04/01/2024 0800 by Kerron Sedano, RN  Safety Interventions:   bed alarm   fall reduction program maintained   low bed     Problem: Seizure, Active Management  Goal: Absence of Seizure/Seizure-Related Injury  Outcome: Progressing     Problem: Self-Care Deficit  Goal: Improved Ability to Complete Activities of Daily Living  Outcome: Progressing

## 2024-04-01 NOTE — Unmapped (Signed)
 Division of Infectious Diseases  General Inpatient Consultation Service     For questions about this consult, page [phone] (Gen B Follow-up Pager).    [facility] is being seen in consultation at the request of Ryan Clubs, MD for evaluation and management of focal seizure of RUE extremity in setting of HIV, concern for possible CNS infection.     PLAN FOR 04/01/2024    Diagnostic  Follow up pending studies listed below:  HSV PCR CSF  VZV PCR CSF  EBV PCR CSF  Enterovirus PCR CSF  HHV-6 PCR CSF  Cryptococcal Ag CSF  CSF culture  Send GI pathogen and C diff to further workup significant diarrhea  Monitor for antimicrobial toxicity with the following:  CBC w/diff at least once per week  BMP at least once per week  clinical assessments for rashes or other skin changes    Treatment  Continue Biktarvy for HIV management  Continue acyclovir IV while we work on determining if any evidence of viral meningitis  Duration of therapy = TBD  start date = TBD  end date = TBD    I discussed the plans for today with primary team, nursing, and patient on 04/01/2024.    Our service will continue to follow.    I personally spent 120 mins (2h) face-to-face and non-face-to-face in the care of this patient on 04/01/2024, which includes all pre, intra, and post visit time on the date of service.  All documented time was specific to the E/M visit and does not include any procedures that may have been performed.    Care for a suspected or confirmed infection was provided by an ID specialist in this encounter. ([phone])      Ryan Burgess, MD  [facility] Division of Infectious Diseases           MDM and Problem-Specific Assessments  ( .00ID2DAY  /  .00IDADDLPROB  /  .IDSS  / .Arliss Benton )     Ryan [facility]enport is a 63yo w/ PMH of HIV (CD4 690, HIV VL 1,369, on Biktarvy, followed by Ryan [facility], prior to that with Ryan [facility]) and DM2 who we are consulted on secondary to concern for possible infectious component to his recent onset of focal RUE seizures.     Patient has: [x]  acute illness w/systemic sxs  [mod] [x]  illness posing risk to life or function  [high]   I reviewed:   (3+) [x]  primary team note []  consultant note(s) []  procedure/op note(s) [x]  micro result(s)    [x]  CBC results [x]  chemistry results [x]  radiology report(s) [x]  w/indep. historian   I independently visualized:   (any)   []  cxs/plates in lab []  plain film images []  CT images []  PET images    []  path slide(s) []  ECG tracing [x]  MRI images []  nuclear scan   I discussed: (any) []  micro and/or path w/lab personnel []  drug options and/or interactions w/ID pharmD    [x]  procedure/OR findings w/other MD(s) [x]  echo and/or imaging w/other MD(s)    []  mgm't w/attending(s) involved in case []  setting up home abx w/OPAT team   Mgm't requires: [x]  prescription drug(s)  [mod] []  intensive toxicity monitoring  [high]       # Focal seizure of RUE - acute, undx'd new problem w/uncertain prognosis  [mod]  Has a remote history of prior seizures in 2013, never on antiepileptics and no further episodes. Now presents with pain in right arm and spasms, finally determined to be due to  focal seizures. Has improved significantly since presentation, patient attributes this to Keppra. Unclear what if any infectious component is playing a role in his seizure manifestation. LP obtained on admission with modest elevation in nucleated cells (22) but with 85% lymphocytes. This indicates that viral would be the most likely etiology if infection is playing a role. Possible that diarrhea is secondary to viral process like Enterovirus or norovirus.   Follow up pending studies listed below:  HSV PCR CSF  VZV PCR CSF  EBV PCR CSF  Enterovirus PCR CSF  HHV-6 PCR CSF  Cryptococcal Ag CSF  CSF culture  Send GI pathogen and C diff to further workup significant diarrhea  Continue acyclovir IV while we work on determining if any evidence of viral meningitis  Duration of therapy = TBD    # Severe diarrhea - acute, undx'd new problem w/uncertain prognosis  [mod]  Continue acyclovir IV while we work on determining if any evidence of viral meningitis    # HIV - chronic, exacerbation / progression / sfx of tx  [mod]  Controlled historically, on admission CD4 690 but with positive HIV VL in the single-digit thousands.   Continue Biktarvy for HIV management      # Management of prescription antimicrobials needing intensive toxicity monitoring - acute, poses threat to life or bodily function  [high]  Acyclovir (and vACV) can cause CNS effects, skin rashes, N/V/D, nephrotoxicity, and/or myelosuppression.   See recommendations in blue box above.      # Disposition  At this point no indication that patient will require prolonged IV antimicrobials.          Antimicrobials & Other Medications  ( .00IDGANTT  /  .00IDGANTTLIST  )     Current  - Biktarvy  - Acyclovir    Previous  - None    Immunomodulators and antipyretics  - None      Current Medications as of 04/01/2024  Scheduled  PRN   acyclovir, 10 mg/kg (Ideal), Q8H SCH  [Provider Hold] apixaban, 5 mg, BID  atorvastatin, 40 mg, Daily  bictegrav-emtricit-tenofov ala, 1 tablet, Daily  cloBAZam, 5 mg, BID  clonazePAM, 0.5 mg, Q8H  empagliflozin, 25 mg, Daily  furosemide, 20 mg, Daily  insulin glargine, 12 Units, Nightly  insulin lispro, 0-20 Units, ACHS  lacosamide, 200 mg, BID  levETIRAcetam, 1,500 mg, Q12H SCH  lisinopril, 10 mg, Daily  metoPROLOL succinate, 25 mg, Daily  pantoprazole, 40 mg, Daily before breakfast  sertraline, 50 mg, Daily  sodium chloride 0.9%, 250 mL, Q8H SCH  sodium chloride 0.9%, 250 mL, Q8H SCH  valproate sodium, 250 mg, Q6H      acetaminophen, 650 mg, Q6H PRN  calcium carbonate, 200 mg elem calcium, TID PRN  cyclobenzaprine, 5 mg, TID PRN  dextrose in water, 12.5 g, Q10 Min PRN  glucagon, 1 mg, Once PRN  glucose, 16 g, Q10 Min PRN  hydrOXYzine, 25 mg, Nightly PRN  lidocaine, 1 patch, Daily PRN  methocarbamol, 750 mg, QID PRN  polyethylene glycol, 17 g, Daily PRN  senna, 1 tablet, Nightly PRN           Physical Exam     Temp:  [36.4 ??C (97.5 ??F)-36.5 ??C (97.7 ??F)] 36.5 ??C (97.7 ??F)  Heart Rate:  [83-101] 101  Resp:  [13-20] 20  BP: (109-147)/(68-93) 131/83  MAP (mmHg):  [81-108] 94  SpO2:  [97 %-99 %] 99 %    Actual body weight:    Ideal body weight: 77.6 kg (  171 lb 1.2 oz)  Adjusted ideal body weight: 90.5 kg (199 lb 7.1 oz)      Const [x]  vital signs above      []  WDWN, NAD, non-toxic appearance  [x]  Chronically ill appearing, lying in bed eating ribs in NAD      Eyes   [x]  Lids normal bilaterally, conjunctiva anicteric and noninjected OU  []  PERRL   []        ENMT     [x]  Normal appearance of external nose and ears       [x]  OP clear    []  MMM, no lesions on lips or gums, dentition good        []  Hearing normal   []        Neck    []  Neck of normal appearance and trachea midline        []  No thyromegaly, nodules, or tenderness   []        Lymph    []  No LAD in neck       []  No LAD in supraclavicular area       []  No LAD in axillae   []  No LAD in epitrochlear chains       []  No LAD in inguinal areas  []        CV    [x]  RRR, no m/r/g, S1/S2       []  No peripheral edema, WWP       []  Pedal pulses intact   []        Resp   [x]  Normal WOB       [x]  CTAB   []        GI   [x]  Normal inspection, NT, modestly distended, NABS       []  No umbilical hernia on exam       []  No hepatosplenomegaly       []  Inspection of perineal and perianal areas normal  []        GU   []  Normal external genitalia       []        MSK   []  No clubbing or cyanosis of hands       []  No focal tenderness or abnormalities on palpation of joints in RUE, LUE, RLE, or LLE  []        Skin   [x]  No rashes, lesions, or ulcers of visualized skin       []  Skin warm and dry to palpation   []        Neuro   [x]  CNs II-XII grossly intact       []  Sensation to light touch grossly intact throughout   []  DTRs normal and symmetric throughout   []  Unable to assess due to critical illness, sedation, or mental status  []  Psych   []  Appropriate affect      [x]  Oriented to person, place, time      []  Judgment and insight are appropriate   []  Unable to assess due to critical illness, sedation, or mental status  []           Patient Lines/Drains/Airways Status       Active Active Lines, Drains, & Airways       Name Placement date Placement time Site Days    Peripheral IV 03/30/24 Left;Posterior Wrist 03/30/24  0112  Wrist  2                      Data for ID Decision  Making  ( IDGENCONMDM )       Micro & Serological Data   ( RSLTMICRO  /  00CXNEW10  /  00CXSRC  /  00CXRES  /  00CXSUSC )    CSF Cell Count with Differential [[id number]]  Collected: 03/31/24 1600   Order Status: Completed Specimen: Cerebrospinal Fluid from Lumbar Puncture Updated: 03/31/24 1817    Tube # CSF Tube 1    Color, CSF Colorless    Appearance, CSF Clear    Nucleated Cells, CSF 22  ul     RBC, CSF 30  ul     Lymphs %, CSF 85.0 %     Mono/Macrophage %, CSF 15.0 %     #Cells counted for Diff 100   CSF protein [[id number]] (Normal) Collected: 03/31/24 1600   Order Status: Completed Specimen: Cerebrospinal Fluid from Lumbar Puncture Updated: 03/31/24 1717    Protein, CSF 42 mg/dL    Glucose, CSF [[id number]]  Collected: 03/31/24 1600   Order Status: Completed Specimen: Cerebrospinal Fluid from Lumbar Puncture Updated: 03/31/24 1720    Glucose, CSF 94  mg/dL    CSF Cell Count with Differential [[id number]]  Collected: 03/31/24 1600   Order Status: Completed Specimen: Cerebrospinal Fluid from Lumbar Puncture Updated: 03/31/24 1822    Tube # CSF Tube 4    Color, CSF Colorless    Appearance, CSF Clear    Nucleated Cells, CSF 13  ul     RBC, CSF <2 ul     Lymphs %, CSF 85.0 %     Mono/Macrophage %, CSF 15.0 %     #Cells counted for Diff 100   Cryptococcal Antigen, CSF [[id number]] Collected: 03/31/24 1600   Order Status: Sent Specimen: Cerebrospinal Fluid from Lumbar Puncture Updated: 03/31/24 1628   Encephalopathy, Autoimmune/Paraneoplastic Eval, CSF [[id number]] Collected: 03/31/24 1600   Order Status: Sent Specimen: Cerebrospinal Fluid Updated: 03/31/24 1628   CSF Culture [[id number]] Collected: 03/31/24 1545   Order Status: Completed Specimen: Cerebrospinal Fluid from Lumbar Puncture Updated: 03/31/24 1748    Gram Stain Result 1+ Polymorphonuclear leukocytes     No organisms seen   Specimen Source: Lumbar Puncture   Varicella (VZV) PCR, Other [[id number]] Collected: 03/31/24 1545   Order Status: Sent Specimen: Cerebrospinal Fluid from Lumbar Puncture Updated: 03/31/24 1613   CMV PCR, Qualitative, Not Blood [[id number]] Collected: 03/31/24 1545   Order Status: Sent Specimen: Cerebrospinal Fluid from Lumbar Puncture Updated: 03/31/24 1612   HHV-6 PCR, Other [[id number]] Collected: 03/31/24 1545   Order Status: Sent Specimen: Cerebrospinal Fluid from Lumbar Puncture Updated: 03/31/24 1612   Enterovirus PCR, CSF [[id number]] Collected: 03/31/24 1545   Order Status: Sent Specimen: Cerebrospinal Fluid from Lumbar Puncture Updated: 03/31/24 1612   EBV PCR, Qualitative, Not Blood [[id number]] Collected: 03/31/24 1545   Order Status: Sent Specimen: Cerebrospinal Fluid from Lumbar Puncture Updated: 03/31/24 1613   HSV 1 AND 2 BY PCR, NOT BLOOD [[id number]] Collected: 03/31/24 1545   Order Status: Sent Specimen: Cerebrospinal Fluid from Lumbar Puncture Updated: 03/31/24 1613       Recent Studies  ( RISRSLT )    EEG video monitoring: 03/30/2024  HISTORY: 63 y.o. male with new onset of episodes of pain and shaking of the right arm   INDICATION: Seizures   PATIENT STATE: Awake, Drowsy, and Sleep   EEG DESCRIPTION: Initial Background Activity (see Daily Interpretation below for description of background changes that occur as the recording progresses)  Frequency (maximal in waking or arousal): During waking there is 9  Hz posterior dominant alpha activity with appropriate organization and anterior-posterior voltage and frequency gradients.      PDR: Present   Asymmetry/ Focal slowing: No Amplitude: Normal (20 - 150 uV)   Continuity: Continuous   Sleep Activity/ State Change: State change present with normal N2 sleep (sleep spindles and vertex waves)   Reactivity: Present   Periodic/ Rhythmic Patterns/ Epileptiform Activity:  No.   Seizures: Yes. There were frequent electroclinical seizures starting over the left parasagittal region and consisting of rhythmic beta activity evolving to slower frequency low amplitude spike discharges, duration 40 seconds.    Clinically the patient cried out and there is tonic flexion of the RUE followed by rapid clonic movements.  Initially seizures were 3-4 per hour, decreasing to about 1 per hour after 1500 on 4/7   Patient Events: No.   DAY 1:  EEG INTERPRETATION:  March 30, 2024 at  1055  to March 30, 2024 at 2015 [facility])   Relevant Medications: Keppra, Vimpat 300 mg load at 1844 on 4/7   Abnormal due to: There were frequent electroclinical seizures starting over the left parasagittal region and consisting of rhythmic beta activity evolving to slower frequency low amplitude spike discharges, duration 40-60 seconds.  Clinically the patient cried out and there is tonic flexion of the RUE followed by rapid clonic movements.   Initially 3-4 per hour decreasing to about 1 per hour after 1500   CLINICAL CORRELATION/ SUMMARY: These findings are consistent with frequent focal electroclinical seizures arising from the left parasagittal region with clinical correlate of right arm flexion and clonic activity.       MRI brain without contrast: 03/30/2024  CLINICAL INDICATION: 63 years old Male with focal arm myoclonus/ spasms / numbness; c/f focal seizure, rule out mass lesion    FINDINGS:  There are scattered foci of signal abnormality within the periventricular and deep white matter.  These are nonspecific but commonly seen with small vessel ischemic changes.  There is no focal parenchymal signal abnormality. Ventricles are normal in size. There is no midline shift. No extra-axial fluid collection.  No evidence of intracranial hemorrhage. No diffusion weighted signal abnormality to suggest acute infarct. No mass. There is no abnormal enhancement.  Moderate left mastoid effusion.    IMPRESSION: No acute intracranial abnormality. No evidence of mass lesion however sensitivity is limited by lack of intravenous contrast.      MRI Cervical Spine Wo Contrast: 03/30/2024  CLINICAL INDICATION: 63 years old Male with right upper extremity weakness and spasms.   FINDINGS: Limited examination. The examination had to be terminated early as the patient had multiple full body spasms in the scanner and was at risk of themselves. Only a T1 and T2 sagittal sequence could be completed. Bone marrow signal intensity is normal. Normal signal in the spinal cord. The vertebral bodies are normally aligned. Straightening normal cervical lordosis. Multilevel intervertebral disc space narrowing of the cervical spine. Mild posterior disc bulges at C4-C5 and C5-C6. In the absence of axial sequences the neuroforaminal cannot be properly evaluated. The paraspinal tissues are within normal limits.   IMPRESSION:   -- Limited examination. The examination had to be terminated early as the patient had multiple full body spasms in the scanner and was at risk of themselves. Only a T1 and T2 sagittal sequence could be completed.   -- Within these limitations there is no abnormal signal in the spinal cord and mild degenerative disc disease of the cervical spine.  CT Head Wo Contrast: 03/26/2024  CLINICAL DATA:  Neuro deficit, acute, stroke suspected   FINDINGS: Brain: No acute intracranial abnormality. Specifically, no hemorrhage, hydrocephalus, mass lesion, acute infarction, or significant intracranial injury. Physiologic calcifications in the basal ganglia. Vascular: No hyperdense vessel or unexpected calcification. Skull: No acute calvarial abnormality. Sinuses/Orbits: No acute findings Other: None   IMPRESSION: No acute intracranial abnormality.        Subjective:    Initial Consult Documentation from April 01, 2024     Sources of information include: chart review, patient, and treating providers.    HPI    Ryan Davenport is a 63yo w/ PMH of HIV (CD4 690, HIV VL 1,369, on Biktarvy, followed by [facility], prior to that with Ryan [facility]) and DM2 who we are consulted on secondary to concern for possible infectious component to his recent onset of focal RUE seizures.     - Hospitalized 03/24/24 at [facility] for hyperglycemia, reported to have been off his diabetes medication for about a month secondary to abdominal pain. Restarted on Lantus and discharged the same day.   - Presented back to ER at [facility] on 03/28/24 with complaint of weakness and pain in RUE. Workup for possible CVA negative. Differential included seizure and vascular injury but both were thought less likely.   - Back to the ER once more (this time [facility] [place]) on 03/30/24 due to ongoing right arm spasms, numbness and pain. Evaluated by neurology who raised concern for possible focal seizure as cause of his symptoms. MRI brain and cervical spine obtained, unrevealing. Patient admitted to neurology service and loaded with Keppra. EEG obtained which was consistent with frequent focal electroclinical seizures arising from the left parasagittal region with clinical correlate of right arm flexion and clonic activity. Admitted to neurology service.  - On 03/31/24 underwent LP given unclear nidus for seizures. CSF studies notable for 22 nucleated cells in tube 1, 13 nucleated cells in tube 4, 85% lymph and 15% mono/macrophages in both tube 1 and 4, glucose of 94, protein of 42.   - Patient reports having worsening diarrhea over the last week as well. Unclear from his report whether this is something that happens recurrently or is a new symptom, he endorsed both. Nursing described his BM as being large volume of liquid stool with fecal incontinence.     For his HIV, is currently on Biktarvy. Reports good compliance with ART. Followed by [facility] and [place] [facility].       Past Medical History   Patient  has a past medical history of Asthma, Asymptomatic human immunodeficiency virus (HIV) infection status (04/09/2013), CAD (coronary artery disease), Depression, Diabetes mellitus, DVT (deep venous thrombosis), Eczema, Enlargement of liver, GERD (gastroesophageal reflux disease), HIV (human immunodeficiency virus infection), Hyperlipidemia, Hypertension, Myocardial infarction, Obesity, Pulmonary embolism (2012), and Sleep apnea.      Meds and Allergies  Patient has a current medication list which includes the following prescription(s): atorvastatin, betamethasone dipropionate, biktarvy, glucose blood, blood-glucose meter, cetirizine, colchicine, dupixent syringe, dupixent syringe, eliquis, empagliflozin, furosemide, gabapentin, hydrocortisone, hydroxyzine, insulin aspart, insulin glargine, lancets, lisinopril, methocarbamol, metoprolol succinate, omeprazole, pen needle, diabetic, [Paused] rybelsus, sertraline, triamcinolone, and [DISCONTINUED] citalopram, and the following Facility-Administered Medications: acetaminophen, acyclovir (ZOVIRAX) 780 mg in sodium chloride (NS) 0.9 % 250 mL IVPB, [Provider Hold] apixaban, atorvastatin, bictegrav-emtricit-tenofov ala, calcium carbonate, clobazam, clonazepam, cyclobenzaprine, dextrose 50 % in water (d50w), empagliflozin, furosemide, glucagon, glucose, hydroxyzine, insulin glargine, insulin lispro, lacosamide, levetiracetam, lidocaine, lisinopril, methocarbamol, metoprolol succinate, pantoprazole, polyethylene glycol,  senna, sertraline, sodium chloride 0.9%, sodium chloride 0.9%, and valproate (DEPACON) 250 mg in sodium chloride (NS) 0.9 % 50 mL infusion.    Allergies: Latex, Shellfish containing products, Metformin, and Penicillins      Social History  Patient  reports that he has never smoked. He has never been exposed to tobacco smoke. He has never used smokeless tobacco. He reports that he does not drink alcohol and does not use drugs.

## 2024-04-02 LAB — VALPROIC ACID, FREE, SERUM: VALPROIC ACID, FREE, S: <3 ug/mL — ABNORMAL LOW

## 2024-04-02 LAB — SPINAL FLUID, PATH REVIEW

## 2024-04-02 MED ADMIN — acyclovir (ZOVIRAX) 780 mg in sodium chloride (NS) 0.9 % 250 mL IVPB: 10 mg/kg | INTRAVENOUS | @ 10:00:00

## 2024-04-02 MED ADMIN — empagliflozin (JARDIANCE) tablet 25 mg: 25 mg | ORAL | @ 12:00:00

## 2024-04-02 MED ADMIN — atorvastatin (LIPITOR) tablet 40 mg: 40 mg | ORAL | @ 12:00:00

## 2024-04-02 MED ADMIN — acyclovir (ZOVIRAX) 780 mg in sodium chloride (NS) 0.9 % 250 mL IVPB: 10 mg/kg | INTRAVENOUS | @ 16:00:00

## 2024-04-02 MED ADMIN — sertraline (ZOLOFT) tablet 50 mg: 50 mg | ORAL | @ 12:00:00

## 2024-04-02 MED ADMIN — lisinopril (PRINIVIL,ZESTRIL) tablet 10 mg: 10 mg | ORAL | @ 12:00:00

## 2024-04-02 MED ADMIN — sodium chloride 0.9% (NS) bolus 250 mL: 250 mL | INTRAVENOUS | @ 09:00:00 | Stop: 2024-05-02

## 2024-04-02 MED ADMIN — guar gum (NUTRISOURCE) 1 packet: 1 | ORAL | @ 03:00:00

## 2024-04-02 MED ADMIN — guar gum (NUTRISOURCE) 1 packet: 1 | ORAL | @ 12:00:00

## 2024-04-02 MED ADMIN — bictegrav-emtricit-tenofov ala (BIKTARVY) 50-200-25 mg tablet 1 tablet: 1 | ORAL | @ 12:00:00

## 2024-04-02 MED ADMIN — levETIRAcetam (KEPPRA) tablet 1,500 mg: 1500 mg | ORAL | @ 01:00:00

## 2024-04-02 MED ADMIN — lacosamide (VIMPAT) tablet 200 mg: 200 mg | ORAL | @ 01:00:00

## 2024-04-02 MED ADMIN — sodium chloride 0.9% (NS) bolus 250 mL: 250 mL | INTRAVENOUS | @ 01:00:00 | Stop: 2024-05-02

## 2024-04-02 MED ADMIN — divalproex (DEPAKOTE SPRINKLE) capsule 500 mg: 500 mg | ORAL | @ 01:00:00

## 2024-04-02 MED ADMIN — furosemide (LASIX) tablet 20 mg: 20 mg | ORAL | @ 12:00:00

## 2024-04-02 MED ADMIN — insulin lispro (HumaLOG) injection 0-20 Units: 0-20 [IU] | SUBCUTANEOUS | @ 03:00:00

## 2024-04-02 MED ADMIN — sodium chloride 0.9% (NS) bolus 250 mL: 250 mL | INTRAVENOUS | @ 15:00:00 | Stop: 2024-05-02

## 2024-04-02 MED ADMIN — levETIRAcetam (KEPPRA) tablet 1,500 mg: 1500 mg | ORAL | @ 12:00:00

## 2024-04-02 MED ADMIN — acyclovir (ZOVIRAX) 780 mg in sodium chloride (NS) 0.9 % 250 mL IVPB: 10 mg/kg | INTRAVENOUS | @ 03:00:00

## 2024-04-02 MED ADMIN — insulin lispro (HumaLOG) injection 0-20 Units: 0-20 [IU] | SUBCUTANEOUS | @ 12:00:00

## 2024-04-02 MED ADMIN — clonazePAM (KlonoPIN) disintegrating tablet 0.5 mg: .5 mg | ORAL | @ 11:00:00 | Stop: 2024-04-02

## 2024-04-02 MED ADMIN — insulin glargine (LANTUS) injection 12 Units: 12 [IU] | SUBCUTANEOUS | @ 03:00:00

## 2024-04-02 MED ADMIN — insulin lispro (HumaLOG) injection 0-20 Units: 0-20 [IU] | SUBCUTANEOUS | @ 21:00:00

## 2024-04-02 MED ADMIN — clonazePAM (KlonoPIN) disintegrating tablet 0.5 mg: .5 mg | ORAL | @ 03:00:00

## 2024-04-02 MED ADMIN — metoPROLOL succinate (Toprol-XL) 24 hr tablet 25 mg: 25 mg | ORAL | @ 12:00:00

## 2024-04-02 MED ADMIN — sodium chloride 0.9% (NS) bolus 250 mL: 250 mL | INTRAVENOUS | @ 12:00:00 | Stop: 2024-05-02

## 2024-04-02 MED ADMIN — cloBAZam (ONFI) split tablet 5 mg: 5 mg | ORAL | @ 12:00:00

## 2024-04-02 MED ADMIN — lacosamide (VIMPAT) tablet 200 mg: 200 mg | ORAL | @ 12:00:00

## 2024-04-02 MED ADMIN — guar gum (NUTRISOURCE) 1 packet: 1 | ORAL | @ 18:00:00

## 2024-04-02 MED ADMIN — pantoprazole (Protonix) EC tablet 40 mg: 40 mg | ORAL | @ 11:00:00

## 2024-04-02 MED ADMIN — cloBAZam (ONFI) split tablet 5 mg: 5 mg | ORAL | @ 01:00:00

## 2024-04-02 MED ADMIN — sodium chloride 0.9% (NS) bolus 250 mL: 250 mL | INTRAVENOUS | @ 20:00:00 | Stop: 2024-05-02

## 2024-04-02 MED ADMIN — divalproex (DEPAKOTE SPRINKLE) capsule 500 mg: 500 mg | ORAL | @ 12:00:00

## 2024-04-02 NOTE — Unmapped (Signed)
 DAILY REAL TIME INTERIM LONGTERM VIDEO EEG MONITORING NOTE    Identifying Information   NAME: Ryan Davenport    MRN: [id number]   DOB: 1961-10-16      LOC: 6126/6126-01    HISTORY: 63 y.o. male with new onset of episodes of pain and shaking of the right arm     INDICATION:  Seizures     PATIENT STATE: Awake, Drowsy, and Sleep     Study Information  EEG Start:   March 30, 2024 at 1055  EEG End: March 30, 2024  at     [facility] 4/7 at 1055 to 4/7 at 2100  Laya Letendre 4/7/at 2100 to TBD     EEG TECHNICAL DESCRIPTION   Conditions of Recording:  Continuous EEG with simultaneous video recording was performed utilizing 21 active electrodes placed according to the international 10-20 system.  The study was recorded digitally with a bandpass of 1-70Hz  and a sampling rate of 200Hz  and was reviewed with the possibility of multiple reformatting.  The study was digitally processed with potential spike and seizure events identified for physician analysis and review.  Patient recognized events were identified by a push button marker and reviewed by the physician.   Simultaneous video was reviewed for all patient events.    EEG DESCRIPTION:       Initial Background Activity (see Daily Interpretation below for description of background changes that occur as the recording progresses)     Frequency (maximal in waking or arousal):  During waking there is 9 Hz posterior dominant alpha activity with appropriate organization and anterior-posterior voltage and frequency gradients.        PDR:  Present    Asymmetry/ Focal slowing: No    Amplitude: Normal (20 - 150 uV)    Continuity:  Continuous     Sleep Activity/ State Change: State change present with normal N2 sleep (sleep spindles and vertex waves)    Reactivity: Present     Periodic/ Rhythmic Patterns/ Epileptiform Activity:  No.    Seizures: Yes. There were frequent electroclinical seizures starting over the left parasagittal region and consisting of rhythmic beta activity evolving to slower frequency low amplitude spike discharges, duration 40 seconds.    Clinically the patient cried out and there is tonic flexion of the RUE followed by rapid clonic movements.  Initially seizures were 3-4 per hour, decreasing to about 1 per hour after 1500 on 4/7      Patient Events: No.    DAY 1:  EEG INTERPRETATION:  March 30, 2024 at  1055  to March 30, 2024 at 2015 [facility])     Relevant Medications: Keppra, Vimpat 300 mg load at 1844 on 4/7    Abnormal due to:     There were frequent electroclinical seizures starting over the left parasagittal region and consisting of rhythmic beta activity evolving to slower frequency low amplitude spike discharges, duration 40-60 seconds.  Clinically the patient cried out and there is tonic flexion of the RUE followed by rapid clonic movements.   Initially 3-4 per hour decreasing to about 1 per hour after 1500      Disconnected for transfer 1730 to 1820       DAY 2:  EEG INTERPRETATION:  March 30, 2024 at  1055  to March 30, 2024 at 2015 [facility])     Relevant Medications: Keppra, Vimpat 300 mg load at 1844 on 4/7    Abnormal due to:     There were frequent electroclinical seizures starting  over the left parasagittal region and consisting of rhythmic beta activity evolving to slower frequency low amplitude spike discharges, duration 40-60 seconds.  Clinically the patient cried out and there is tonic flexion of the RUE followed by rapid clonic movements.   Initially 3-4 per hour decreasing to about 1 per hour after 1500      Disconnected for transfer 1730 to 1820       DAY 3:  EEG INTERPRETATION:  March 30, 2024 at  2015  to March 31, 2024 at 1000 (Taylorann Tkach)     Relevant Medications: Keppra, Vimpat, received 2 mg lorazepam  Abnormal due to:     Background similar to prior days, there was one seizure at 21:15 hrs that lasted few seconds.  Patient was given lorazepam 2 mg and background with no seizures after until 2 am when discharges are seen over the left parasagittally region that are persistent. These discharges continued until 6 am to then resolving and re starting at 7 am until 8:41 hrs on April 8th.  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  No more discharges are seen after 8:41 hrs and no seizures are seen after.        DAY 4:  EEG INTERPRETATION:  March 31, 2024 at  1000  to April 01, 2024 at 0800 (Murlin Schrieber)     Relevant Medications: Keppra, Vimpat, Valproate     Abnormal due to:     Background similar to prior days and on April 8 at 14:55 hrs while the resident was at the bedside examining the patient. The patient had myoclonic movements on the right hand for what Valproate load was given.  EEG has muscle artifact obscuring the background but there are high amplitude discharges seen that back log with the myoclonic movement.    Seizure lasted few sec, no more seizures or event buttons were seen after       DAY 5:  EEG INTERPRETATION:  April 01, 2024 at  0800  to April 02, 2024 at 0800 (Trameka Dorough)     Relevant Medications: Keppra, Vimpat, Valproate, clobazam, clonazepam    Abnormal due to:   - mild diffuse slowing as the background is formed by alpha frequencies with admixed theta    No electrographic seizures were seen       CLINICAL CORRELATION/ SUMMARY     These findings are consistent with frequent focal electroclinical seizures arising from the left parasagittal region with clinical correlate of right arm flexion and clonic activity that improved in frequency. Last seizure was seen on 14:55 hrs on April 8 characterized by myoclonic movements of few seconds.      Interpreting Attending: Benita Bramble, MD         2HELPS2B Seizure Risk Score  Clinical risk score based on EEG findings and clinical history of seizures to aid in determination of optimal duration of EEG monitoring for detection of electrographic seizures.  Score has not been validated in patients under age 80 or following cardiac arrest.    NOT APPLICABLE FOR THIS PATIENT     Add 1 point if known history of epilepsy or prior clinical seizure.      Risk Group     Seizure Risk   at 72 hours   Duration of Monitoring    Seizure risk < 5%     Duration of Monitoring  Seizure risk < 2%       Low Risk,   Score = 0     3.1%   1 Hour   3.3 Hours     Medium Risk,   Score = 1     12%   12 Hours   29 Hours     High Risk,   Score = 2 or greater                >25%   >24 Hours   >30 Hours   Struck et al JAMA Neurology, January 2020    2HELPS2B Seizure Risk Score is not appropriate when EEG monitoring is being performed for the following indications:  Treatment of status epilepticus, seizures already documented on EEG or Ictal Interictal continuum pattern (IIC)  Monitoring sedation/ burst suppression for management of intracranial pressure and/or paralyzed patients  Diagnostic evaluation of transient episodes concerning for possible seizures (spell capture)   Patients s/p cardiac arrest undergoing targeted temperature management      Cherrie Cornwall al. American Clinical Neurophysiology Society's Standardized Critical Care EEG Terminology: 2021 Version. Journal of Clinical Neurophysiology 38(1):p 1-29, January 2021.   ACNS STANDARDIZED ICU EEG NOMENCLATURE v ([email or web address])

## 2024-04-02 NOTE — Unmapped (Signed)
 Problem: Adult Inpatient Plan of Care  Goal: Plan of Care Review  Outcome: Progressing  Flowsheets (Taken 04/02/2024 1441)  Progress: improving  Plan of Care Reviewed With: patient  Note: Patients vital signs have been stable. Patient has not shown any physical signs of pain. Patient has not had any neurological changes throughout the shift. A&Ox3 confused to situation.  Worked with PT today. Still on VEEG. Family came to visit. Drowsy throughout shift but easy to awaken. Patients bed alarm has been on, bed in lowest position, and call bell within reach. Patient has been encouraged to turn every two hours to prevent skin breakdown. Multiple loose BM's. MD made aware. Will continue to monitor.    Goal: Patient-Specific Goal (Individualized)  Outcome: Progressing  Flowsheets (Taken 04/02/2024 1441)  Patient/Family-Specific Goals (Include Timeframe): Patient will not have any falls through 04/07/24  Goal: Absence of Hospital-Acquired Illness or Injury  Outcome: Progressing  Intervention: Identify and Manage Fall Risk  Recent Flowsheet Documentation  Taken 04/02/2024 0800 by Caldwell Castles, RN  Safety Interventions:   bed alarm   fall reduction program maintained   low bed   nonskid shoes/slippers when out of bed  Intervention: Prevent and Manage VTE (Venous Thromboembolism) Risk  Recent Flowsheet Documentation  Taken 04/02/2024 1400 by Caldwell Castles, RN  Anti-Embolism Device Type: SCD, Knee  Anti-Embolism Device Status: Refused  Anti-Embolism Device Location: BLE  Taken 04/02/2024 1200 by Caldwell Castles, RN  Anti-Embolism Device Type: SCD, Knee  Anti-Embolism Device Status: Refused  Anti-Embolism Device Location: BLE  Taken 04/02/2024 1000 by Caldwell Castles, RN  Anti-Embolism Device Type: SCD, Knee  Anti-Embolism Device Status: Refused  Anti-Embolism Device Location: BLE  Taken 04/02/2024 0800 by Caldwell Castles, RN  Anti-Embolism Device Type: SCD, Knee  Anti-Embolism Device Status: Refused  Anti-Embolism Device Location: BLE  Goal: Optimal Comfort and Wellbeing  Outcome: Progressing  Goal: Readiness for Transition of Care  Outcome: Progressing  Goal: Rounds/Family Conference  Outcome: Progressing

## 2024-04-02 NOTE — Unmapped (Signed)
 MRI completed this shift.  Patient's vitals were stable this shift.  No neuro changes noted.  No complaints of pain or signs of discomfort. Patient was compliant with all medications and tolerated them well.  Voiding adequate amounts of urine.  Patient was able to rest comfortably throughout the shift.  Will continue to monitor.      Problem: Adult Inpatient Plan of Care  Goal: Plan of Care Review  Outcome: Progressing  Goal: Patient-Specific Goal (Individualized)  Outcome: Progressing  Goal: Absence of Hospital-Acquired Illness or Injury  Outcome: Progressing  Intervention: Identify and Manage Fall Risk  Recent Flowsheet Documentation  Taken 04/01/2024 2000 by Wileen Hane, RN  Safety Interventions:   bed alarm   low bed   fall reduction program maintained  Intervention: Prevent and Manage VTE (Venous Thromboembolism) Risk  Recent Flowsheet Documentation  Taken 04/02/2024 0600 by Wileen Hane, RN  Anti-Embolism Device Type: SCD, Knee  Anti-Embolism Device Status: Refused  Anti-Embolism Device Location: BLE  Taken 04/02/2024 0400 by Wileen Hane, RN  Anti-Embolism Device Type: SCD, Knee  Anti-Embolism Device Status: Refused  Anti-Embolism Device Location: BLE  Taken 04/02/2024 0200 by Wileen Hane, RN  Anti-Embolism Device Type: SCD, Knee  Anti-Embolism Device Status: Refused  Anti-Embolism Device Location: BLE  Taken 04/02/2024 0000 by Wileen Hane, RN  Anti-Embolism Device Type: SCD, Knee  Anti-Embolism Device Status: Refused  Anti-Embolism Device Location: BLE  Taken 04/01/2024 2200 by Wileen Hane, RN  Anti-Embolism Device Type: SCD, Knee  Anti-Embolism Device Status: Refused  Anti-Embolism Device Location: BLE  Taken 04/01/2024 2000 by Wileen Hane, RN  Anti-Embolism Device Type: SCD, Knee  Anti-Embolism Device Status: Refused  Anti-Embolism Device Location: BLE  Goal: Optimal Comfort and Wellbeing  Outcome: Progressing  Goal: Readiness for Transition of Care  Outcome: Progressing  Goal: Rounds/Family Conference  Outcome: Progressing

## 2024-04-02 NOTE — Unmapped (Signed)
 Neurology Inpatient Team B (NMB)  Daily Progress Note       Patient: Ryan Davenport  Code Status: Full Code  Level of Care: Acute floor status.   LOS: 3 days      Overnight Events & Subjective:     -Patient is alert and oriented, more alert than yesterday morning. He has not noticed tonic / clonic activity in his right arm and hand the past day.    -Mother (contact person) and aunt at bedside (prior notes state patient has a wife, but he is not married)     Physical Exam:   Physical Exam:  General Appearance: in no acute distress, well appearing,, and alert,. Normal skin color, afebrile.  HEENT: Head is atraumatic and normocephalic. Sclera anicteric without injection. Oropharyngeal membranes are moist with no erythema or exudate.  Neck: Grossly normal range of motion.  Lungs: Normal work of breathing.  Heart: warm, well perfused.  Abdomen: Soft, nontender, nondistended.  Extremities: No clubbing, cyanosis, or edema.      NEUROLOGICAL EXAMINATION:      General/Mental Status:    Alert, conversant, able to follow conversation and interview.     Comprehension was intact.   Attention span and concentration were diminished.    Spontaneous speech was with mild dysarthria.       Cranial Nerves:   II, III - Pupils are equal and reactive to light b/l (direct and consensual reactions). No visual field defects.   III, IV, VI - Pursuit eye movements were uninterrupted with full range and without more than end-gaze nystagmus. Extra ocular movements are intact, no ptosis, no diplopia, no nystagmus.  V - Sensation of the face intact b/l to light touch in all three divisions of CNV.   VII - Face symmetrical at rest, no facial droop. Normal facial movements bilaterally including forehead, eye closure, and smile/grimace.  VIII - Hearing grossly intact to conversation.   IX and X - Palate movement is symmetric, no dysarthria.  XI - Full shoulder shrug bilaterally; normal tone and strength of sternocleidomastoid muscles bilaterally.  XII - No tongue atrophy, no tongue fasciculations; tongue protrudes midline, full range of movements of the tongue.     Motor:   Normal bulk. No tremors, myoclonus, or other adventitious movement.  Pronator drift is absent. Fasciculations not observed.      Muscle strength:  LUE 5/5  RUE 1/5 at deltoid, bicep, tricep. 2/5 at [facility], ECR, and hand grip.  BLE 5/5.     Reflexes: Deferred     Sensory:           UEs LEs     R L R L   Light touch WNL WNL WNL WNL      Cerebellar/Coordination/Gait:  Deferred       Assessment/Plan:       Assessment: Ryan Davenport is a 63 y.o. 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     ACTIVE PROBLEMS:  #Left parasagittal focal seizures  Initial presentation with 1 week of right upper extremity and facial tonic followed by clonic activity lasting less than 1 minute, though increasing in frequency up to 15 events per day. With EEG showing left parasagittal focal seizures, constitutes left focal seizures correlating with aforementioned semiology, though etiology remains unclear. Patient continued to have frequent seizures, so Vimpat  was  started 4/7 PM, Depakote started 4/8 AM, Onfi and Klonopin started 4/8 PM. Last seizure noted 4/8 PM. With decreased seizure activity for 2 days, discontinuing Klonopin today. MRI brain with contrast does not show acute intracranial abnormality or discrete epileptogenic focus to explain the seizures.     With patient's history of HIV (previously AIDS, though CD4 count recovered to 694/7), and history of classically immunocompromised infections (CMV retinitis, molluscum contagiosum, secondary syphilis), could raise suspicion for new CNS infectious etiology. However, MRI brain with and without contrast not showing any focal lesions or bleeds to explain the current symptoms and no systemic signs to explain meningitis or infectious encephalitis. CSF showed elevated cell counts, predominantly lymphocytes raising suspicion for viral meningitis. ID is consulted for further infectious workup given history of HIV. EBV, HSV negative. C. Diff negative.     ASM regimen: (avoiding DDI with Biktarvy)  - Keppra 1 g twice daily  - Vimpat 200 mg twice daily  -- Depakote 250mg  q6h    -- Onfi 5 mg twice daily  -- Discontinue Klonopin    Plan:   - Acyclovir 780 mg every 8 hours until further  - ID consulted; following for recs    Workup   -- Follow up CSF viral PCR (EBV, HSV negative) and heme path add on   -- Follow up GIPP    Pending Labs       Order Current Status    Encephalopathy, Autoimmune/Paraneoplastic Eval, CSF In process    Encephalopathy, Autoimmune/Paraneoplastic Eval, Serum In process    Hematopathology Order In process    CSF Culture Preliminary result            STABLE PROBLEMS:  #Hx DVT/PE  Chart history of DVT PE 2012, on long-term anticoagulation as per hematology.  Patient states last took Eliquis 03/29/24 afternoon.  - Holding home Eliquis given LP as above     #T2DM  History of poorly controlled type 2 diabetes with A1c 12 (increased from 6.4% 3 months prior).  Initial presentation with hyperglycemia to outside hospital, 4/5 to 500s, subsequently improved.  On 12 units Lantus nightly, recently restarted 4/1, and 5 units lispro 3 times daily AC.  Had insurance issues filling his insulin prescription recently which explains the poor blood sugar control.  Plan to restart Lantus as previously with aggressive sliding scale and up titration of mealtime pending sliding requirements.  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 HIV RNA was detected, but absolute count is 690. Medical history complicated by multiple infections including right CMV retinitis, molluscum contagiosum, and secondary syphilis, all status post treatment. On Biktarvy at home.   - Continue Biktarvy daily  - Acyclovir 780 mg every 8 hours for HSV ppx    #Depression  -Continue home sertraline 50 mg daily     #CAD s/p stent 2021 #HLD  History of CAD ISO HLD s/p stent 2021, though not previously on aspirin at home.  Per family, not taking an aspirin.  -Consider inpatient vs outpatient cardiology follow up to determine if needing ASA  -Restart home atorvastatin 40 mg daily, Metoprolol 25 mg qd     #HTN  - Lisinopril 10 mg every day, Lasix 20 qd     #GERD  -Start pharmacy equivalent pantoprazole 40 mg daily (home omeprazole 40 mg daily)     #Hx Gout  -  Hold home colchicine as needed     Discharge Planning:   - Case management: N/A  - Social work: N/A  - PT: consulted, 3xLO  - OT: consulted, TBD  - SLP: N/A  - Expected Discharge Disposition: 4/14     Checklist:  - Diet: Regular diet  - IV fluids: no  - Bowel Regimen: No indication for a bowel regimen at this time (reason: PRN)  - GI PPX: No GI indications  - DVT PPX:  Holding given consideration for LP  - Lines/Access: PIV x1.    - Foley: No     This patient was seen and discussed with Dr. Parker Bollard, who agrees with the above assessment and plan.       Please page Neurology Team B at [phone] for any questions/concerns.    Jiwoo Kim, MS4  I attest that I have reviewed the medical student note and that the components of the history of the present illness, the physical exam, and the assessment and plan documented were performed by me or were performed in my presence by the student where I verified the documentation and performed (or re-performed) the exam and medical decision making.   Brady Cagey MD  Resident Physician PGY3  [facility] Department of Neurology        Data Review:     Contact Information:  Family contact: Reginal Wojcicki (Mother, [phone])  PCP: Consuelo Denmark, PA    Medications:  Scheduled medications:    acyclovir  10 mg/kg (Ideal) Intravenous Q8H SCH    [Provider Hold] apixaban  5 mg Oral BID    atorvastatin  40 mg Oral Daily    bictegrav-emtricit-tenofov ala  1 tablet Oral Daily    cloBAZam  5 mg Oral BID    divalproex  500 mg Oral Q12H    empagliflozin  25 mg Oral Daily    furosemide  20 mg Oral Daily    guar gum  1 packet Oral TID    insulin glargine  12 Units Subcutaneous Nightly    insulin lispro  0-20 Units Subcutaneous ACHS    lacosamide  200 mg Oral BID    levETIRAcetam  1,500 mg Oral BID    lisinopril  10 mg Oral Daily    metoPROLOL succinate  25 mg Oral Daily    pantoprazole  40 mg Oral Daily before breakfast    sertraline  50 mg Oral Daily    sodium chloride 0.9%  250 mL Intravenous Q8H SCH    sodium chloride 0.9%  250 mL Intravenous Q8H SCH     Continuous infusions:   PRN medications: acetaminophen, calcium carbonate, cyclobenzaprine, dextrose in water, glucagon, glucose, hydrOXYzine, lidocaine, methocarbamol, polyethylene glycol, senna    24 hour vital signs:  Temp:  [36.4 ??C (97.5 ??F)-36.5 ??C (97.7 ??F)] 36.5 ??C (97.7 ??F)  Heart Rate:  [78-103] 78  Resp:  [13-18] 16  BP: (126-161)/(76-92) 126/79  MAP (mmHg):  [91-111] 92  SpO2:  [99 %-100 %] 100 %    Ins and Outs:  No intake/output data recorded.    Laboratory values:  All Labs Last 24hrs:   Recent Results (from the past 24 hours)   POCT Glucose    Collection Time: 04/01/24 10:22 PM   Result Value Ref Range    Glucose, POC 191 (H) 70 - 179 mg/dL   POCT Glucose    Collection Time: 04/02/24  7:30 AM   Result Value Ref Range    Glucose, POC 247 (H) 70 -  179 mg/dL   POCT Glucose    Collection Time: 04/02/24 11:53 AM   Result Value Ref Range    Glucose, POC 109 70 - 179 mg/dL   POCT Glucose    Collection Time: 04/02/24  4:14 PM   Result Value Ref Range    Glucose, POC 150 70 - 179 mg/dL       Imaging:  Pertinent imaging discussed in the A/P section

## 2024-04-02 NOTE — Unmapped (Signed)
 VENOUS ACCESS ULTRASOUND PROCEDURE NOTE    Indications:   Poor venous access.    The Venous Access Team has assessed this patient for the placement of a PIV. Ultrasound guidance was necessary to obtain access.     Procedure Details:  Identity of the patient was confirmed via name, medical record number and date of birth. The availability of the correct equipment was verified.    The vein was identified for ultrasound catheter insertion.  Field was prepared with necessary supplies and equipment.  Probe cover and sterile gel utilized.  Insertion site was prepped with chlorhexidine solution and allowed to dry.  The catheter extension was primed with normal saline. A(n) 22 gauge 1.75 catheter was placed in the L Forearm with 1attempt(s). See LDA for additional details.    Catheter aspirated, 5 mL blood return present. The catheter was then flushed with 10 mL of normal saline. Insertion site cleansed, and dressing applied per manufacturer guidelines. The catheter was inserted with difficulty due to poor vasculature by Harlie Lieu, RN.    Care RN was notified.     Thank you,     Harlie Lieu, RN Venous Access Team   [phone]     Workup / Procedure Time:  30 minutes    See images below:

## 2024-04-03 MED ADMIN — guar gum (NUTRISOURCE) 1 packet: 1 | ORAL | @ 12:00:00

## 2024-04-03 MED ADMIN — loperamide (IMODIUM) capsule 2 mg: 2 mg | ORAL | @ 12:00:00

## 2024-04-03 MED ADMIN — acyclovir (ZOVIRAX) 780 mg in sodium chloride (NS) 0.9 % 250 mL IVPB: 10 mg/kg | INTRAVENOUS | @ 02:00:00

## 2024-04-03 MED ADMIN — sodium chloride 0.9% (NS) bolus 250 mL: 250 mL | INTRAVENOUS | @ 09:00:00 | Stop: 2024-04-03

## 2024-04-03 MED ADMIN — cloBAZam (ONFI) split tablet 5 mg: 5 mg | ORAL | @ 01:00:00

## 2024-04-03 MED ADMIN — cloBAZam (ONFI) split tablet 5 mg: 5 mg | ORAL | @ 12:00:00

## 2024-04-03 MED ADMIN — bictegrav-emtricit-tenofov ala (BIKTARVY) 50-200-25 mg tablet 1 tablet: 1 | ORAL | @ 12:00:00

## 2024-04-03 MED ADMIN — metoPROLOL succinate (Toprol-XL) 24 hr tablet 25 mg: 25 mg | ORAL | @ 12:00:00

## 2024-04-03 MED ADMIN — sertraline (ZOLOFT) tablet 50 mg: 50 mg | ORAL | @ 12:00:00

## 2024-04-03 MED ADMIN — insulin glargine (LANTUS) injection 12 Units: 12 [IU] | SUBCUTANEOUS | @ 02:00:00

## 2024-04-03 MED ADMIN — lacosamide (VIMPAT) tablet 200 mg: 200 mg | ORAL | @ 01:00:00

## 2024-04-03 MED ADMIN — insulin lispro (HumaLOG) injection 0-20 Units: 0-20 [IU] | SUBCUTANEOUS | @ 02:00:00

## 2024-04-03 MED ADMIN — divalproex (DEPAKOTE SPRINKLE) capsule 500 mg: 500 mg | ORAL | @ 01:00:00

## 2024-04-03 MED ADMIN — levETIRAcetam (KEPPRA) tablet 1,500 mg: 1500 mg | ORAL | @ 01:00:00

## 2024-04-03 MED ADMIN — pantoprazole (Protonix) EC tablet 40 mg: 40 mg | ORAL | @ 12:00:00

## 2024-04-03 MED ADMIN — lacosamide (VIMPAT) tablet 200 mg: 200 mg | ORAL | @ 12:00:00

## 2024-04-03 MED ADMIN — acyclovir (ZOVIRAX) 780 mg in sodium chloride (NS) 0.9 % 250 mL IVPB: 10 mg/kg | INTRAVENOUS | @ 10:00:00 | Stop: 2024-04-03

## 2024-04-03 MED ADMIN — levETIRAcetam (KEPPRA) tablet 1,500 mg: 1500 mg | ORAL | @ 12:00:00

## 2024-04-03 MED ADMIN — loperamide (IMODIUM) capsule 2 mg: 2 mg | ORAL | @ 22:00:00

## 2024-04-03 MED ADMIN — lisinopril (PRINIVIL,ZESTRIL) tablet 10 mg: 10 mg | ORAL | @ 12:00:00

## 2024-04-03 MED ADMIN — empagliflozin (JARDIANCE) tablet 25 mg: 25 mg | ORAL | @ 12:00:00

## 2024-04-03 MED ADMIN — sodium chloride 0.9% (NS) bolus 250 mL: 250 mL | INTRAVENOUS | @ 12:00:00 | Stop: 2024-04-03

## 2024-04-03 MED ADMIN — guar gum (NUTRISOURCE) 1 packet: 1 | ORAL | @ 01:00:00

## 2024-04-03 MED ADMIN — furosemide (LASIX) tablet 20 mg: 20 mg | ORAL | @ 12:00:00

## 2024-04-03 MED ADMIN — atorvastatin (LIPITOR) tablet 40 mg: 40 mg | ORAL | @ 12:00:00

## 2024-04-03 MED ADMIN — colchicine (COLCRYS) tablet 0.6 mg: .6 mg | ORAL | @ 19:00:00 | Stop: 2024-04-06

## 2024-04-03 MED ADMIN — divalproex (DEPAKOTE SPRINKLE) capsule 500 mg: 500 mg | ORAL | @ 12:00:00 | Stop: 2024-04-03

## 2024-04-03 MED ADMIN — sodium chloride 0.9% (NS) bolus 250 mL: 250 mL | INTRAVENOUS | @ 01:00:00 | Stop: 2024-05-02

## 2024-04-03 NOTE — Unmapped (Signed)
 [facility]   Initial Consultation         Assessment and Recommendations:   Ryan Davenport is a 63 y.o. 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 The patient is seen in consultation at the request of Romayne Clubs, MD (Neurology (NEU)) for  diarrhea .    Acute diarrhea  73M h/o HIV w/ CD4 620, p/w seizure then developed diarrhea while inpatient. GIPP negative, Cdiff negative. Patient says that diarrhea is happening because he is taking medicines on empty stomach. Denies having diarrhea before presentation because he eats food with his meds. Given h/o opportunistic infections in the past, would consider infectious diarrhea as potential etiology. Infectious testing thus far is negative, would consider O&P to exclude further organisms not on GIPP. Less concern for CMV colitis or other viral colitides (benign abdominal exam). If truly non-infectious then may consider medication related vs diet related.   - oral rehydration, electrolyte monitoring  - consider stool O&P, would discuss with ID  - would consider colonoscopy if ID team feels prudent to rule-out source of diarrhea  - if infectious ruled out, can consider trialing bulking medications such as fiber or banana flakes; if still not improving can consider trialing loperamide     Issues Impacting Complexity of Management:  -None    Recommendations discussed with the patient's primary team. We will continue to follow along with you.    Subjective:     73M presenting with seizure. During hospitalization developed diarrhea. Per team says watery, explosive, causes accidents. Patient however describes 3-4 episodes of diarrhea a day and believes it is happening because he is taking medications on empty stomach and not with food like prior.    -I have reviewed the patient's prior records from this admission as summarized in the HPI    Objective:   Temp:  [36.6 ??C (97.9 ??F)-37.1 ??C (98.8 ??F)] 36.8 ??C (98.2 ??F)  Heart Rate:  [87-118] 107  Resp:  [12-19] 17  BP: (127-160)/(67-91) 159/90  SpO2:  [98 %-100 %] 99 %    Gen: WDWN male in NAD, answers questions appropriately  Abdomen: Soft, NTND  Extremities: No edema in the BLEs    Pertinent Labs & Studies:  -I have reviewed the patient's labs from 04/03/24 which show stable Hgb

## 2024-04-03 NOTE — Unmapped (Signed)
 Neurology Inpatient Team B (NMB)  Daily Progress Note       Patient: Ryan Davenport  Code Status: Full Code  Level of Care: Acute floor status.   LOS: 4 days      Overnight Events & Subjective:     - Patient is alert and oriented, but drowsy. He denies tonic / clonic movements in his right arm or hands. Patient states he has more watery diarrhea especially with taking medications without food.   - Patient endorses pain in his right big toe and and ankle, similar to his previous gout flares.      Physical Exam:   Physical Exam:  General Appearance: in no acute distress, well appearing,, and alert,. Normal skin color, afebrile.  HEENT: Head is atraumatic and normocephalic. Sclera anicteric without injection. Oropharyngeal membranes are moist with no erythema or exudate.  Neck: Grossly normal range of motion.  Lungs: Normal work of breathing.  Heart: warm, well perfused.  Abdomen: Soft, nontender, nondistended.  Extremities: No clubbing, cyanosis, or edema.      NEUROLOGICAL EXAMINATION:      General/Mental Status:    Alert, conversant, able to follow conversation and interview.     Comprehension was intact.   Attention span and concentration were diminished.    Spontaneous speech was with mild dysarthria.       Cranial Nerves:   II, III - Pupils are equal and reactive to light b/l (direct and consensual reactions). No visual field defects.   III, IV, VI - Pursuit eye movements were uninterrupted with full range and without more than end-gaze nystagmus. Extra ocular movements are intact, no ptosis, no diplopia, no nystagmus.  V - Sensation of the face intact b/l to light touch in all three divisions of CNV.   VII - Face symmetrical at rest, no facial droop. Normal facial movements bilaterally including forehead, eye closure, and smile/grimace.  VIII - Hearing grossly intact to conversation.   IX and X - Palate movement is symmetric, no dysarthria.  XI - Full shoulder shrug bilaterally; normal tone and strength of sternocleidomastoid muscles bilaterally.  XII - No tongue atrophy, no tongue fasciculations; tongue protrudes midline, full range of movements of the tongue.     Motor:   Normal bulk. No tremors, myoclonus, or other adventitious movement.  Pronator drift is absent. Fasciculations not observed.      Muscle strength:  LUE 5/5  RUE 1/5 at deltoid, bicep, tricep. 2/5 at [facility], ECR, and hand grip.  BLE 5/5.     Reflexes: Deferred     Sensory:           UEs LEs     R L R L   Light touch WNL WNL WNL WNL      Cerebellar/Coordination/Gait:  Deferred       Assessment/Plan:       Assessment: Ryan Davenport is a 63 y.o. man with a past medical history of HIV (on Bictarvy), DVT/PE (on [facility]), multiple prior infections (CMV right retinitis, mulluscum contagium, secondary syphillis) who was admitted to [facility] on 03/30/2024 for RUE/R face focal seizures.     ACTIVE PROBLEMS:  #Left parasagittal focal seizures  Initial presentation with 1 week of right upper extremity and facial tonic followed by clonic activity lasting less than 1 minute, though increasing in frequency up to 15 events per day. With EEG showing left parasagittal focal seizures, constitutes left focal seizures correlating with aforementioned semiology, though etiology remains unclear. MRI brain with contrast  does not show acute intracranial abnormality or discrete epileptogenic focus to explain the seizures. Patient continued to have frequent seizures, so Vimpat  was started 4/7 PM, Depakote  started 4/8 AM, Onfi  and Klonopin  started 4/8 PM. Last seizure noted 4/8 PM. With decreased seizure activity for 2 days, discontinued Klonopin  4/10. Discontinuing Depakote  today and continuing EEG monitoring.     With patient's history of HIV (previously AIDS, though CD4 count recovered to 694/7), and history of classically immunocompromised infections (CMV retinitis, molluscum contagiosum, secondary syphilis), could raise suspicion for new CNS infectious etiology. However, MRI brain with and without contrast not showing any focal lesions or bleeds to explain the current symptoms and no systemic signs to explain meningitis or infectious encephalitis. CSF showed elevated cell counts, predominantly lymphocytes raising suspicion for viral meningitis. ID is consulted for further infectious workup given history of HIV. CSF viral PCR negative. GIPP, C. Diff negative.     ASM regimen: (avoiding DDI with Biktarvy )  - Keppra  1 g twice daily  - Vimpat  200 mg twice daily  - Onfi  5 mg twice daily  - Discontinue Depakote      Plan:   - Discontinue Acyclovir   - ID consulted; following for recs  - Check ammonia level for drowsiness while on ASM  - Continue EEG while reducing ASM    Workup:   -- MRI brain wo 03/30/24: Unremarkable  -- MRI brain w/ 04/01/24 (epilepsy protocol): Unremarkable  --EEG (4/7-): Frequent focal electroclinical seizures arising from the left parasagittal region with clinical correlate of right arm flexion and clonic activity, last seizure 4/814: 55  -- Serum labs: WBC 6.2, CD4 count 690, HIV RNA 1369,  - CSF labs: 13 nucleated cells, less than 2 RBC, protein 42, glucose 94, culture no growth to date, CMV negative, EBV negative, enterovirus negative, HHV-6 negative, HSV 1/2 negative, VZV negative, crypto antigen negative, cytology with no malignant cells and mostly small lymphocytes without viral cytopathic effect, heme path with no blasts or abnormal lymphoid cells or monotypic B/T cells  -- Stool: GI pathogen panel negative  -- UA noninfectious    #Diarrhea  Patient has had increased urgency and frequency of watery diarrhea since admission. He has had watery diarrhea at home, but less frequently and no urgency. No blood notes in stool at home or inpatient. He states that taking large amounts of medications orally with no food is making his diarrhea worse. GIPP negative. GI is consulted for further recommendations.   - GI consulted; following for recs    #Gout  Patient has hx of gout, on home colchicine  0.6 mg tablet daily prn.  On 4/11, patient has pain on right hallux and ankle. On physical exam, moderately tender to palpation, no erythema or swelling. Starting colchicine  0.6 mg daily until gout resolves.  - Start Colchicine  0.6 mg daily until gout resolves    Pending Labs       Order Current Status    Encephalopathy, Autoimmune/Paraneoplastic Eval, CSF In process    Encephalopathy, Autoimmune/Paraneoplastic Eval, Serum In process    CSF Culture Preliminary result            STABLE PROBLEMS:  #Hx DVT/PE  Chart history of DVT PE 2012, on long-term anticoagulation as per hematology.  Patient states last took Eliquis  03/29/24 afternoon.  - Holding home Eliquis  given LP as above     #T2DM  History of poorly controlled type 2 diabetes with A1c 12 (increased from 6.4% 3 months prior).  Initial presentation with hyperglycemia to  outside hospital, 4/5 to 500s, subsequently improved.  On 12 units Lantus nightly, recently restarted 4/1, and 5 units lispro 3 times daily AC.  Had insurance issues filling his insulin prescription recently which explains the poor blood sugar control.  Plan to restart Lantus as previously with aggressive sliding scale and up titration of mealtime pending sliding requirements.  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 HIV RNA was detected, but absolute count is 690. Medical history complicated by multiple infections including right CMV retinitis, molluscum contagiosum, and secondary syphilis, all status post treatment. On Biktarvy at home.   - Continue Biktarvy daily    #Depression  -Continue home sertraline 50 mg daily     #CAD s/p stent 2021 #HLD  History of CAD ISO HLD s/p stent 2021, though not previously on aspirin at home.  Per family, not taking an aspirin.  -Consider inpatient vs outpatient cardiology follow up to determine if needing ASA  -Restart home atorvastatin 40 mg daily, Metoprolol 25 mg qd     #HTN  - Lisinopril 10 mg every day, Lasix 20 qd     #GERD  -Start pharmacy equivalent pantoprazole 40 mg daily (home omeprazole 40 mg daily)       Discharge Planning:   - Case management: N/A  - Social work: N/A  - PT: 5xL  - OT: 5xH  - SLP: N/A  - Expected Discharge Disposition: 4/14    Checklist:  - Diet: Regular diet  - IV fluids: no  - Bowel Regimen: No indication for a bowel regimen at this time (reason: PRN)  - GI PPX: No GI indications  - DVT PPX:  Holding given consideration for LP  - Lines/Access: PIV x1.    - Foley: No     This patient was seen and discussed with Dr. Parker Bollard, who agrees with the above assessment and plan.       Please page Neurology Team B at [phone] for any questions/concerns.    Jiwoo Kim, MS4  I attest that I have reviewed the medical student note and that the components of the history of the present illness, the physical exam, and the assessment and plan documented were performed by me or were performed in my presence by the student where I verified the documentation and performed (or re-performed) the exam and medical decision making.   Brady Cagey MD  Resident Physician PGY3  [facility] Department of Neurology        Data Review:     Contact Information:  Family contact: Clemens Lachman (Mother, [phone])  PCP: Consuelo Denmark, PA    Medications:  Scheduled medications:    [Provider Hold] apixaban  5 mg Oral BID    atorvastatin  40 mg Oral Daily    bictegrav-emtricit-tenofov ala  1 tablet Oral Daily    cloBAZam  5 mg Oral BID    colchicine  0.6 mg Oral Daily    empagliflozin  25 mg Oral Daily    furosemide  20 mg Oral Daily    guar gum  1 packet Oral TID    insulin glargine  12 Units Subcutaneous Nightly    insulin lispro  0-20 Units Subcutaneous ACHS    lacosamide  200 mg Oral BID    levETIRAcetam  1,500 mg Oral BID    lisinopril  10 mg Oral Daily  metoPROLOL  succinate  25 mg Oral Daily    pantoprazole   40 mg Oral Daily before breakfast    sertraline   50 mg Oral Daily     Continuous infusions:   PRN medications: acetaminophen , calcium carbonate, cyclobenzaprine , dextrose in water, glucagon, glucose, hydrOXYzine , lidocaine , loperamide , methocarbamol , polyethylene glycol, senna    24 hour vital signs:  Temp:  [36.5 ??C (97.7 ??F)-37.1 ??C (98.8 ??F)] 37.1 ??C (98.8 ??F)  Heart Rate:  [78-118] 108  Resp:  [12-19] 19  BP: (126-160)/(67-91) 130/67  MAP (mmHg):  [86-108] 86  SpO2:  [98 %-100 %] 98 %    Ins and Outs:  I/O this shift:  In: -   Out: 1100 [Urine:1100]    Laboratory values:  All Labs Last 24hrs:   Recent Results (from the past 24 hours)   POCT Glucose    Collection Time: 04/02/24  4:14 PM   Result Value Ref Range    Glucose, POC 150 70 - 179 mg/dL   POCT Glucose    Collection Time: 04/02/24  9:33 PM   Result Value Ref Range    Glucose, POC 151 70 - 179 mg/dL   POCT Glucose    Collection Time: 04/03/24  8:18 AM   Result Value Ref Range    Glucose, POC 72 70 - 179 mg/dL   POCT Glucose    Collection Time: 04/03/24 11:56 AM   Result Value Ref Range    Glucose, POC 138 70 - 179 mg/dL       Imaging:  Pertinent imaging discussed in the A/P section

## 2024-04-03 NOTE — Unmapped (Signed)
 DAILY REAL TIME INTERIM LONGTERM VIDEO EEG MONITORING NOTE    Identifying Information   NAME: Ryan Davenport    MRN: [id number]   DOB: 03/08/1961      LOC: 6126/6126-01    HISTORY: 63 y.o. male with new onset of episodes of pain and shaking of the right arm     INDICATION:  Seizures     PATIENT STATE: Awake, Drowsy, and Sleep     Study Information  EEG Start:   March 30, 2024 at 1055  EEG End: March 30, 2024  at     [facility] 4/7 at 1055 to 4/7 at 2100  Melonie Germani 4/7/at 2100 to TBD     EEG TECHNICAL DESCRIPTION   Conditions of Recording:  Continuous EEG with simultaneous video recording was performed utilizing 21 active electrodes placed according to the international 10-20 system.  The study was recorded digitally with a bandpass of 1-70Hz  and a sampling rate of 200Hz  and was reviewed with the possibility of multiple reformatting.  The study was digitally processed with potential spike and seizure events identified for physician analysis and review.  Patient recognized events were identified by a push button marker and reviewed by the physician.   Simultaneous video was reviewed for all patient events.    EEG DESCRIPTION:       Initial Background Activity (see Daily Interpretation below for description of background changes that occur as the recording progresses)     Frequency (maximal in waking or arousal):  During waking there is 9 Hz posterior dominant alpha activity with appropriate organization and anterior-posterior voltage and frequency gradients.        PDR:  Present    Asymmetry/ Focal slowing: No    Amplitude: Normal (20 - 150 uV)    Continuity:  Continuous     Sleep Activity/ State Change: State change present with normal N2 sleep (sleep spindles and vertex waves)    Reactivity: Present     Periodic/ Rhythmic Patterns/ Epileptiform Activity:  No.    Seizures: Yes. There were frequent electroclinical seizures starting over the left parasagittal region and consisting of rhythmic beta activity evolving to slower frequency low amplitude spike discharges, duration 40 seconds.    Clinically the patient cried out and there is tonic flexion of the RUE followed by rapid clonic movements.  Initially seizures were 3-4 per hour, decreasing to about 1 per hour after 1500 on 4/7      Patient Events: No.    DAY 1:  EEG INTERPRETATION:  March 30, 2024 at  1055  to March 30, 2024 at 2015 [facility])     Relevant Medications: Keppra , Vimpat  300 mg load at 1844 on 4/7    Abnormal due to:     There were frequent electroclinical seizures starting over the left parasagittal region and consisting of rhythmic beta activity evolving to slower frequency low amplitude spike discharges, duration 40-60 seconds.  Clinically the patient cried out and there is tonic flexion of the RUE followed by rapid clonic movements.   Initially 3-4 per hour decreasing to about 1 per hour after 1500      Disconnected for transfer 1730 to 1820       DAY 2:  EEG INTERPRETATION:  March 30, 2024 at  1055  to March 30, 2024 at 2015 [facility])     Relevant Medications: Keppra , Vimpat  300 mg load at 1844 on 4/7    Abnormal due to:     There were frequent electroclinical seizures starting  over the left parasagittal region and consisting of rhythmic beta activity evolving to slower frequency low amplitude spike discharges, duration 40-60 seconds.  Clinically the patient cried out and there is tonic flexion of the RUE followed by rapid clonic movements.   Initially 3-4 per hour decreasing to about 1 per hour after 1500      Disconnected for transfer 1730 to 1820       DAY 3:  EEG INTERPRETATION:  March 30, 2024 at  2015  to March 31, 2024 at 1000 (Race Latour)     Relevant Medications: Keppra , Vimpat , received 2 mg lorazepam   Abnormal due to:     Background similar to prior days, there was one seizure at 21:15 hrs that lasted few seconds.  Patient was given lorazepam  2 mg and background with no seizures after until 2 am when discharges are seen over the left parasagittally region that are persistent. These discharges continued until 6 am to then resolving and re starting at 7 am until 8:41 hrs on April 8th.  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  No more discharges are seen after 8:41 hrs and no seizures are seen after.        DAY 4:  EEG INTERPRETATION:  March 31, 2024 at  1000  to April 01, 2024 at 0800 (Manoah Deckard)     Relevant Medications: Keppra , Vimpat , Valproate     Abnormal due to:     Background similar to prior days and on April 8 at 14:55 hrs while the resident was at the bedside examining the patient. The patient had myoclonic movements on the right hand for what Valproate load was given.  EEG has muscle artifact obscuring the background but there are high amplitude discharges seen that back log with the myoclonic movement.    Seizure lasted few sec, no more seizures or event buttons were seen after       DAY 5:  EEG INTERPRETATION:  April 01, 2024 at  0800  to April 02, 2024 at 0800 (Ivelis Norgard)     Relevant Medications: Keppra , Vimpat , Valproate, clobazam , clonazepam     Abnormal due to:   - mild diffuse slowing as the background is formed by alpha frequencies with admixed theta    No electrographic seizures were seen       DAY 6:  EEG INTERPRETATION:  April 02, 2024 at  0800  to April 03, 2024 at 0800 (Bertrum Helmstetter)     Relevant Medications: Keppra , Vimpat , Valproate, clobazam ,  Abnormal due to:   - Mild diffuse slowing as the background is formed by alpha frequencies     No electrographic seizures were seen       CLINICAL CORRELATION/ SUMMARY     These findings are consistent with frequent focal electroclinical seizures arising from the left parasagittal region with clinical correlate of right arm flexion and clonic activity that improved in frequency. Last seizure was seen on 14:55 hrs on April 8 characterized by myoclonic movements of few seconds.      Interpreting Attending: Benita Bramble, MD         2HELPS2B Seizure Risk Score  Clinical risk score based on EEG findings and clinical history of seizures to aid in determination of optimal duration of EEG monitoring for detection of electrographic seizures.  Score has not been validated in patients under age 79 or following cardiac arrest.  NOT APPLICABLE FOR THIS PATIENT     Add 1 point if known history of epilepsy or prior clinical seizure.      Risk Group     Seizure Risk   at 72 hours   Duration of Monitoring    Seizure risk < 5%     Duration of Monitoring    Seizure risk < 2%       Low Risk,   Score = 0     3.1%   1 Hour   3.3 Hours     Medium Risk,   Score = 1     12%   12 Hours   29 Hours     High Risk,   Score = 2 or greater                >25%   >24 Hours   >30 Hours   Struck et al JAMA Neurology, January 2020    2HELPS2B Seizure Risk Score is not appropriate when EEG monitoring is being performed for the following indications:  Treatment of status epilepticus, seizures already documented on EEG or Ictal Interictal continuum pattern (IIC)  Monitoring sedation/ burst suppression for management of intracranial pressure and/or paralyzed patients  Diagnostic evaluation of transient episodes concerning for possible seizures (spell capture)   Patients s/p cardiac arrest undergoing targeted temperature management      Cherrie Cornwall al. American Clinical Neurophysiology Society's Standardized Critical Care EEG Terminology: 2021 Version. Journal of Clinical Neurophysiology 38(1):p 1-29, January 2021.   ACNS STANDARDIZED ICU EEG NOMENCLATURE v ([email or web address])

## 2024-04-03 NOTE — Unmapped (Signed)
 Division of Infectious Diseases  General Inpatient Consultation Service     For questions about this consult, page [phone] (Gen B Follow-up Pager).    Ryan Davenport is being seen in consultation at the request of Romayne Clubs, MD for evaluation and management of focal seizure of RUE extremity in setting of HIV, concern for possible CNS infection.     PLAN FOR 04/03/2024    Diagnostic  Follow up pending studies listed below:  CSF culture  Send stool O&P to further workup significant diarrhea  Monitor for antimicrobial toxicity with the following:  CBC w/diff at least once per week  BMP at least once per week  clinical assessments for rashes or other skin changes    Treatment  Continue Biktarvy for HIV management  With negative C diff, reasonable to treat diarrhea with loperamide, lomotil, etc      I discussed the plans for today with primary team, nursing, and patient on 04/03/2024.    Our service will sign off. We will follow up on the O&P in case it results positive and reengage with the consult.     I personally spent 60 mins (1h) face-to-face and non-face-to-face in the care of this patient on 04/03/2024, which includes all pre, intra, and post visit time on the date of service.  All documented time was specific to the E/M visit and does not include any procedures that may have been performed.    Care for a suspected or confirmed infection was provided by an ID specialist in this encounter. ([phone])      [name], MD  [place] Division of Infectious Diseases           MDM and Problem-Specific Assessments  ( .00ID2DAY  /  .00IDADDLPROB  /  .IDSS  / .Arliss Benton )     [name] is a 63yo w/ PMH of HIV (CD4 690, HIV VL 1,369, on Biktarvy, followed by [facility], prior to that with [facility]) and DM2 who we are consulted on secondary to concern for possible infectious component to his recent onset of focal RUE seizures.     Patient has: [x]  acute illness w/systemic sxs  [mod] [x]  illness posing risk to life or function  [high]   I reviewed:   (3+) [x]  primary team note []  consultant note(s) []  procedure/op note(s) [x]  micro result(s)    [x]  CBC results [x]  chemistry results [x]  radiology report(s) [x]  w/indep. historian   I independently visualized:   (any)   []  cxs/plates in lab []  plain film images []  CT images []  PET images    []  path slide(s) []  ECG tracing [x]  MRI images []  nuclear scan   I discussed: (any) []  micro and/or path w/lab personnel []  drug options and/or interactions w/ID pharmD    [x]  procedure/OR findings w/other MD(s) [x]  echo and/or imaging w/other MD(s)    []  mgm't w/attending(s) involved in case []  setting up home abx w/OPAT team   Mgm't requires: [x]  prescription drug(s)  [mod] []  intensive toxicity monitoring  [high]       # Focal seizure of RUE - acute, undx'd new problem w/uncertain prognosis  [mod]  Has a remote history of prior seizures in 2013, never on antiepileptics and no further episodes. Now presents with pain in right arm and spasms, finally determined to be due to focal seizures. Has improved significantly since presentation, patient attributes this to Keppra. Unclear if any infectious component is playing a role in his seizure manifestation. LP  obtained on admission with modest elevation in nucleated cells (22) but with 85% lymphocytes. Workup for bacterial, viral and fungal causes has all been negative.   Follow up pending studies listed below:  HSV PCR CSF  VZV PCR CSF  EBV PCR CSF  Enterovirus PCR CSF  HHV-6 PCR CSF  Cryptococcal Ag CSF  CSF culture  Send stool O&P to further workup significant diarrhea    # Severe diarrhea - acute, undx'd new problem w/uncertain prognosis  [mod]  Send stool O&P to further workup significant diarrhea    # HIV - chronic, exacerbation / progression / sfx of tx  [mod]  Controlled historically, on admission CD4 690 but with positive HIV VL in the single-digit thousands.   Continue Biktarvy for HIV management      # Management of prescription antimicrobials needing intensive toxicity monitoring - acute, poses threat to life or bodily function  [high]  Acyclovir (and vACV) can cause CNS effects, skin rashes, N/V/D, nephrotoxicity, and/or myelosuppression.   See recommendations in blue box above.      # Disposition  At this point no indication that patient will require prolonged IV antimicrobials.          Antimicrobials & Other Medications  ( .00IDGANTT  /  .00IDGANTTLIST  )     Current  - Biktarvy      Previous  - Acyclovir    Immunomodulators and antipyretics  - None      Current Medications as of 04/03/2024  Scheduled  PRN   [Provider Hold] apixaban, 5 mg, BID  atorvastatin, 40 mg, Daily  bictegrav-emtricit-tenofov ala, 1 tablet, Daily  cloBAZam, 5 mg, BID  colchicine, 0.6 mg, Daily  empagliflozin, 25 mg, Daily  furosemide, 20 mg, Daily  guar gum, 1 packet, TID  insulin glargine, 12 Units, Nightly  insulin lispro, 0-20 Units, ACHS  lacosamide, 200 mg, BID  levETIRAcetam, 1,500 mg, BID  lisinopril, 10 mg, Daily  metoPROLOL succinate, 25 mg, Daily  pantoprazole, 40 mg, Daily before breakfast  sertraline, 50 mg, Daily      acetaminophen, 650 mg, Q6H PRN  calcium carbonate, 200 mg elem calcium, TID PRN  cyclobenzaprine, 5 mg, TID PRN  dextrose in water, 12.5 g, Q10 Min PRN  glucagon, 1 mg, Once PRN  glucose, 16 g, Q10 Min PRN  hydrOXYzine, 25 mg, Nightly PRN  lidocaine, 1 patch, Daily PRN  loperamide, 2 mg, QID PRN  methocarbamol, 750 mg, QID PRN  polyethylene glycol, 17 g, Daily PRN  senna, 1 tablet, Nightly PRN           Physical Exam     Temp:  [36.5 ??C (97.7 ??F)-37.1 ??C (98.8 ??F)] 37.1 ??C (98.8 ??F)  Heart Rate:  [78-118] 108  Resp:  [12-19] 19  BP: (126-160)/(67-91) 130/67  MAP (mmHg):  [86-108] 86  SpO2:  [98 %-100 %] 98 %    Actual body weight:    Ideal body weight: 77.6 kg (171 lb 1.2 oz)  Adjusted ideal body weight: 90.5 kg (199 lb 7.1 oz)      Const [x]  vital signs above      []  WDWN, NAD, non-toxic appearance  [x]  Chronically ill appearing, lying in bed eating ribs in NAD      Eyes   [x]  Lids normal bilaterally, conjunctiva anicteric and noninjected OU  []  PERRL   []        ENMT     [x]  Normal appearance of external nose and ears       [  x] OP clear    []  MMM, no lesions on lips or gums, dentition good        []  Hearing normal   []        Neck    []  Neck of normal appearance and trachea midline        []  No thyromegaly, nodules, or tenderness   []        Lymph    []  No LAD in neck       []  No LAD in supraclavicular area       []  No LAD in axillae   []  No LAD in epitrochlear chains       []  No LAD in inguinal areas  []        CV    [x]  RRR, no m/r/g, S1/S2       []  No peripheral edema, WWP       []  Pedal pulses intact   []        Resp   [x]  Normal WOB       [x]  CTAB   []        GI   [x]  Normal inspection, NT, modestly distended, NABS       []  No umbilical hernia on exam       []  No hepatosplenomegaly       []  Inspection of perineal and perianal areas normal  []        GU   []  Normal external genitalia       []        MSK   []  No clubbing or cyanosis of hands       []  No focal tenderness or abnormalities on palpation of joints in RUE, LUE, RLE, or LLE  []        Skin   [x]  No rashes, lesions, or ulcers of visualized skin       []  Skin warm and dry to palpation   []        Neuro   [x]  CNs II-XII grossly intact       []  Sensation to light touch grossly intact throughout   []  DTRs normal and symmetric throughout   []  Unable to assess due to critical illness, sedation, or mental status  []        Psych   []  Appropriate affect      [x]  Oriented to person, place, time      []  Judgment and insight are appropriate   []  Unable to assess due to critical illness, sedation, or mental status  []           Patient Lines/Drains/Airways Status       Active Active Lines, Drains, & Airways       Name Placement date Placement time Site Days    Peripheral IV 04/02/24 Anterior;Left;Proximal Forearm 04/02/24  1546  Forearm  less than 1                      Data for ID Decision Making  ( IDGENCONMDM )       Micro & Serological Data   ( RSLTMICRO  /  Robinette Chou  /  April Knack  /  00CXRES  /  00CXSUSC )  Comprehensive Ova and Parasite Exam [[id number]] Collected: 04/03/24 1806   Order Status: Sent Specimen: Stool Updated: 04/03/24 1815   GI Pathogen Panel [[id number]] (Normal) Collected: 04/01/24 1725   Order Status: Completed Specimen: Stool Updated: 04/02/24 1349    Adenovirus F 40/41 PCR Not Detected  Astrovirus PCR Not Detected    Norovirus GI/GII PCR Not Detected    Rotavirus A PCR Not Detected    Campylobacter PCR Not Detected    E. coli O157 Not Performed    Enteropathogenic E. coli (EPEC) PCR Not Detected    Enterotoxigenic E. coli (ETEC) PCR Not Detected    Plesiomonas shigelloides PCR Not Detected    Salmonella PCR Not Detected    Shiga-like Toxin-Producing E. coli (STEC) PCR Not Detected    Shigella/ Enteroinvasive E. coli (EIEC) PCR Not Detected    Yersinia enterocolitica PCR Not Detected    Cryptosporidium PCR Not Detected    Cyclospora cayetanensis PCR Not Detected    Entamoeba histolytica PCR Not Detected    Giardia lamblia PCR Not Detected   Narrative:     Test performed using the QIAStat-Dx Gastrointestinal Panel 2, an FDA-cleared qualitative molecular multiplex test. Positive results for Campylobacter, Cryptosporidium, Cyclospora, shiga toxin producing E. coli (STEC), Salmonella, and Shigella must be reported by both the physician and laboratory to the [place] [facility]. Full test limitations can be reviewed here: [email or web address]   C. Difficile Assay [[id number]] (Normal) Collected: 04/01/24 1725   Order Status: Completed Specimen: Stool Updated: 04/02/24 0914    C. Diff Result Negative    Comment: Clostridium difficile NOT detected      Narrative:     The methodology of this test detects C. difficile toxin A and/or toxin B, by EIA.     CSF Cell Count with Differential [[id number]] (Abnormal) Collected: 03/31/24 1600   Order Status: Completed Specimen: Cerebrospinal Fluid from Lumbar Puncture Updated: 04/02/24 1148    Tube # CSF Tube 1    Color, CSF Colorless    Appearance, CSF Clear    Nucleated Cells, CSF 22 High  ul     RBC, CSF 30 High  ul     Lymphs %, CSF 85.0 %     Mono/Macrophage %, CSF 15.0 %     #Cells counted for Diff 100   CSF protein [[id number]] (Normal) Collected: 03/31/24 1600   Order Status: Completed Specimen: Cerebrospinal Fluid from Lumbar Puncture Updated: 03/31/24 1717    Protein, CSF 42 mg/dL    Glucose, CSF [[id number]] (Abnormal) Collected: 03/31/24 1600   Order Status: Completed Specimen: Cerebrospinal Fluid from Lumbar Puncture Updated: 03/31/24 1720    Glucose, CSF 94 High  mg/dL    CSF Cell Count with Differential [id number]] (Abnormal) Collected: 03/31/24 1600   Order Status: Completed Specimen: Cerebrospinal Fluid from Lumbar Puncture Updated: 03/31/24 1822    Tube # CSF Tube 4    Color, CSF Colorless    Appearance, CSF Clear    Nucleated Cells, CSF 13 High  ul     RBC, CSF <2 ul     Lymphs %, CSF 85.0 %     Mono/Macrophage %, CSF 15.0 %     #Cells counted for Diff 100   Cryptococcal Antigen, CSF [[id number]] (Normal) Collected: 03/31/24 1600   Order Status: Completed Specimen: Cerebrospinal Fluid from Lumbar Puncture Updated: 04/01/24 1534    Crypto Ag, CSF Negative   Encephalopathy, Autoimmune/Paraneoplastic Eval, CSF [[id number]] Collected: 03/31/24 1600   Order Status: Sent Specimen: Cerebrospinal Fluid Updated: 03/31/24 1628   CSF Culture [[id number]] Collected: 03/31/24 1545   Order Status: Completed Specimen: Cerebrospinal Fluid from Lumbar Puncture Updated: 04/03/24 1045    CSF Culture NO GROWTH TO DATE    Gram Stain Result 1+ Polymorphonuclear leukocytes     No organisms seen  Narrative:     Specimen Source: Lumbar Puncture   Varicella (VZV) PCR, Other [[id number]] (Normal) Collected: 03/31/24 1545   Order Status: Completed Specimen: Cerebrospinal Fluid from Lumbar Puncture Updated: 04/02/24 1510    VZV PCR Negative   Narrative:     -  Varicella zoster virus real-time PCR targets the immediate-early 62 gene (ORF62).  This test was developed and its performance characteristics determined by the [facility].  This laboratory is CAP accredited and CLIA certified to perform high complexity testing.  This test has not been approved by the [place]  Food and Drug Administration.  However, such approval is not required for clinical implementation and test results have been shown to be clinically useful.  Results from this test should be interpreted in conjunction with other laboratory and clinical data.   HSV PCR [[id number]] (Normal) Collected: 03/31/24 1545   Order Status: Completed Specimen: Cerebrospinal Fluid from Lumbar Puncture Updated: 04/01/24 1526   Narrative:     The following orders were created for panel order HSV PCR.  Procedure                               Abnormality         Status                    ---------                               -----------         ------                    HSV 1 AND 2 BY PCR, NOT.Aaron AasAaron Aas[[id number]]  Normal              Final result                Please view results for these tests on the individual orders.   CMV PCR, Qualitative, Not Blood [[id number]] (Normal) Collected: 03/31/24 1545   Order Status: Completed Specimen: Cerebrospinal Fluid from Lumbar Puncture Updated: 04/02/24 1510    CMV PCR, Qualitative Negative   Narrative:     Specimen Source: Lumbar Puncture  -  CMV real-time PCR targets the polymerase gene.  This test was developed and its performance characteristics determined by the [facility].  This laboratory is CAP accredited and CLIA certified to perform high complexity testing.  This test has not been approved by the [place]  Food and Drug Administration.  However, such approval is not required for clinical implementation and test results have been shown to be clinically useful.  Results from this test should be interpreted in conjunction with other laboratory and clinical data.   HHV-6 PCR, Other [[id number]] (Normal) Collected: 03/31/24 1545   Order Status: Completed Specimen: Cerebrospinal Fluid from Lumbar Puncture Updated: 04/02/24 1510    HHV6 PCR Result Negative   Narrative:     Specimen Source: Lumbar Puncture  -  HHV-6 real-time PCR targets the HHV-6 U67 gene.  This test was developed and its performance characteristics determined by the [facility].  This laboratory is CAP accredited and CLIA certified to perform high complexity testing.  This test has not been approved by the [place]  Food and Drug Administration.  However, such approval is not required for clinical implementation and test results have been shown  to be clinically useful.  Results from this test should be interpreted in conjunction with other laboratory and clinical data.   Enterovirus PCR, CSF [[id number]] (Normal) Collected: 03/31/24 1545   Order Status: Completed Specimen: Cerebrospinal Fluid from Lumbar Puncture Updated: 04/02/24 1510    Enterovirus PCR, CSF Negative   Narrative:     Enterovirus real-time RT-PCR targets the 5' untranslated region of the enteroviral genome. This assay does not detect parechovirus (echoviruses 22 and 23). This test was developed, and its performance characteristics determined by the [facility]. This laboratory is CAP accredited and CLIA certified to perform high complexity testing. This test has not been approved by the [place]  Food and Drug Administration. However, such approval is not required for clinical implementation and test results have been shown to be clinically useful. Results from this test should be interpreted in conjunction with other laboratory and clinical data.   EBV PCR, Qualitative, Not Blood [[id number]] (Normal) Collected: 03/31/24 1545   Order Status: Completed Specimen: Cerebrospinal Fluid from Lumbar Puncture Updated: 04/01/24 1526    EBV PCR, Qualitative Not Blood Negative   Narrative:     EBV real-time PCR targets the BamH1W segment. This test was developed and its performance characteristics determined by the [facility] which is CAP accredited and CLIA certified to perform high complexity testing.  This test has not been approved by the [place]  Food and Drug Administration.  However, such approval is not required for clinical implementation and test results have been shown to be clinically useful.  Results from this test should be interpreted in conjunction with other laboratory and clinical data.     HSV 1 AND 2 BY PCR, NOT BLOOD [[id number]] (Normal) Collected: 03/31/24 1545   Order Status: Completed Specimen: Cerebrospinal Fluid from Lumbar Puncture Updated: 04/01/24 1526    HSV 1 and 2 PCR Negative   Narrative:     Specimen Source: Lumbar Puncture  -  Herpes simplex virus (HSV) real-time PCR targets the glycoprotein B gene.  This test was developed and its performance characteristics determined by the [facility].  This laboratory is CAP accredited and CLIA certified to perform high complexity testing.  This test has not been approved by the US  Food and Drug Administration.  However, such approval is not required for clinical implementation and test results have been shown to be clinically useful.  Results from this test should be interpreted in conjunction with other laboratory and clinical data.       Recent Studies  ( RISRSLT )    EEG video monitoring: 03/30/2024  HISTORY: 63 y.o. male with new onset of episodes of pain and shaking of the right arm   INDICATION: Seizures   PATIENT STATE: Awake, Drowsy, and Sleep   EEG DESCRIPTION: Initial Background Activity (see Daily Interpretation below for description of background changes that occur as the recording progresses)  Frequency (maximal in waking or arousal): During waking there is 9 Hz posterior dominant alpha activity with appropriate organization and anterior-posterior voltage and frequency gradients.      PDR: Present   Asymmetry/ Focal slowing: No   Amplitude: Normal (20 - 150 uV)   Continuity: Continuous   Sleep Activity/ State Change: State change present with normal N2 sleep (sleep spindles and vertex waves)   Reactivity: Present   Periodic/ Rhythmic Patterns/ Epileptiform Activity:  No.   Seizures: Yes. There were frequent electroclinical seizures starting over the left parasagittal region and consisting  of rhythmic beta activity evolving to slower frequency low amplitude spike discharges, duration 40 seconds.    Clinically the patient cried out and there is tonic flexion of the RUE followed by rapid clonic movements.  Initially seizures were 3-4 per hour, decreasing to about 1 per hour after 1500 on 4/7   Patient Events: No.   DAY 1:  EEG INTERPRETATION:  March 30, 2024 at  1055  to March 30, 2024 at [facility])   Relevant Medications: Keppra , Vimpat  300 mg load at 1844 on 4/7   Abnormal due to: There were frequent electroclinical seizures starting over the left parasagittal region and consisting of rhythmic beta activity evolving to slower frequency low amplitude spike discharges, duration 40-60 seconds.  Clinically the patient cried out and there is tonic flexion of the RUE followed by rapid clonic movements.   Initially 3-4 per hour decreasing to about 1 per hour after 1500   CLINICAL CORRELATION/ SUMMARY: These findings are consistent with frequent focal electroclinical seizures arising from the left parasagittal region with clinical correlate of right arm flexion and clonic activity.       MRI brain without contrast: 03/30/2024  CLINICAL INDICATION: 63 years old Male with focal arm myoclonus/ spasms / numbness; c/f focal seizure, rule out mass lesion    FINDINGS:  There are scattered foci of signal abnormality within the periventricular and deep white matter.  These are nonspecific but commonly seen with small vessel ischemic changes.  There is no focal parenchymal signal abnormality. Ventricles are normal in size. There is no midline shift. No extra-axial fluid collection.  No evidence of intracranial hemorrhage. No diffusion weighted signal abnormality to suggest acute infarct. No mass. There is no abnormal enhancement.  Moderate left mastoid effusion.    IMPRESSION: No acute intracranial abnormality. No evidence of mass lesion however sensitivity is limited by lack of intravenous contrast.      MRI Cervical Spine Wo Contrast: 03/30/2024  CLINICAL INDICATION: 63 years old Male with right upper extremity weakness and spasms.   FINDINGS: Limited examination. The examination had to be terminated early as the patient had multiple full body spasms in the scanner and was at risk of themselves. Only a T1 and T2 sagittal sequence could be completed. Bone marrow signal intensity is normal. Normal signal in the spinal cord. The vertebral bodies are normally aligned. Straightening normal cervical lordosis. Multilevel intervertebral disc space narrowing of the cervical spine. Mild posterior disc bulges at C4-C5 and C5-C6. In the absence of axial sequences the neuroforaminal cannot be properly evaluated. The paraspinal tissues are within normal limits.   IMPRESSION:   -- Limited examination. The examination had to be terminated early as the patient had multiple full body spasms in the scanner and was at risk of themselves. Only a T1 and T2 sagittal sequence could be completed.   -- Within these limitations there is no abnormal signal in the spinal cord and mild degenerative disc disease of the cervical spine.     CT Head Wo Contrast: 03/26/2024  CLINICAL DATA:  Neuro deficit, acute, stroke suspected   FINDINGS: Brain: No acute intracranial abnormality. Specifically, no hemorrhage, hydrocephalus, mass lesion, acute infarction, or significant intracranial injury. Physiologic calcifications in the basal ganglia. Vascular: No hyperdense vessel or unexpected calcification. Skull: No acute calvarial abnormality. Sinuses/Orbits: No acute findings Other: None   IMPRESSION: No acute intracranial abnormality.        Subjective:    Initial Consult Documentation from April 01, 2024  Sources of information include: chart review, patient, and treating providers.    HPI    Mr. Morss is a 63yo w/ PMH of HIV (CD4 690, HIV VL 1,369, on Biktarvy , followed by [facility], prior to that with [facility]) and DM2 who we are consulted on secondary to concern for possible infectious component to his recent onset of focal RUE seizures.     - Hospitalized 03/24/24 at [facility] for hyperglycemia, reported to have been off his diabetes medication for about a month secondary to abdominal pain. Restarted on Lantus  and discharged the same day.   - Presented back to ER at [facility] on 03/28/24 with complaint of weakness and pain in RUE. Workup for possible CVA negative. Differential included seizure and vascular injury but both were thought less likely.   - Back to the ER once more (this time [facility]) on 03/30/24 due to ongoing right arm spasms, numbness and pain. Evaluated by neurology who raised concern for possible focal seizure as cause of his symptoms. MRI brain and cervical spine obtained, unrevealing. Patient admitted to neurology service and loaded with Keppra . EEG obtained which was consistent with frequent focal electroclinical seizures arising from the left parasagittal region with clinical correlate of right arm flexion and clonic activity. Admitted to neurology service.  - On 03/31/24 underwent LP given unclear nidus for seizures. CSF studies notable for 22 nucleated cells in tube 1, 13 nucleated cells in tube 4, 85% lymph and 15% mono/macrophages in both tube 1 and 4, glucose of 94, protein of 42.   - Patient reports having worsening diarrhea over the last week as well. Unclear from his report whether this is something that happens recurrently or is a new symptom, he endorsed both. Nursing described his BM as being large volume of liquid stool with fecal incontinence.     For his HIV, is currently on Biktarvy . Reports good compliance with ART. Followed by [facility] and [facility].       Past Medical History   Patient  has a past medical history of Asthma, Asymptomatic human immunodeficiency virus (HIV) infection status (04/09/2013), CAD (coronary artery disease), Depression, Diabetes mellitus, DVT (deep venous thrombosis), Eczema, Enlargement of liver, GERD (gastroesophageal reflux disease), HIV (human immunodeficiency virus infection), Hyperlipidemia, Hypertension, Myocardial infarction, Obesity, Pulmonary embolism (2012), and Sleep apnea.      Meds and Allergies  Patient has a current medication list which includes the following prescription(s): atorvastatin , betamethasone  dipropionate, biktarvy , glucose blood, blood-glucose meter, cetirizine , colchicine , dupixent  syringe, dupixent  syringe, eliquis , empagliflozin , furosemide , gabapentin , hydrocortisone , hydroxyzine , insulin  aspart, insulin  glargine, lancets, lisinopril , methocarbamol , metoprolol  succinate, omeprazole , pen needle, diabetic, [Paused] rybelsus , sertraline , triamcinolone , and [DISCONTINUED] citalopram , and the following Facility-Administered Medications: acetaminophen , [Provider Hold] apixaban , atorvastatin , bictegrav-emtricit-tenofov ala, calcium carbonate, clobazam , colchicine , cyclobenzaprine , dextrose 50 % in water (d50w), empagliflozin , furosemide , glucagon, glucose, guar gum, hydroxyzine , insulin  glargine, insulin  lispro, lacosamide , levetiracetam , lidocaine , lisinopril , loperamide , methocarbamol , metoprolol  succinate, pantoprazole , polyethylene glycol, senna, and sertraline .    Allergies: Latex, Shellfish containing products, Metformin , and Penicillins      Social History  Patient  reports that he has never smoked. He has never been exposed to tobacco smoke. He has never used smokeless tobacco. He reports that he does not drink alcohol  and does not use drugs.

## 2024-04-03 NOTE — Unmapped (Signed)
 Problem: Adult Inpatient Plan of Care  Goal: Plan of Care Review  Outcome: Progressing  Flowsheets (Taken 04/03/2024 1725)  Outcome Evaluation: pt alert and oriented times 4, withdrawn, EEG leads intact, no seizure activity noted, no falls or injuries, pt several loose stools, dry and scaly skin, c/o of pain in right foot, pt think it gout flare up and administered medication for foot, GI consult placed, no pressure injury pressent, seizure precaution maintain(side rail up and padded time 4, bed in lowest position, suction and oxygen at bedside) will continue to monitor  Goal: Patient-Specific Goal (Individualized)  Outcome: Progressing  Goal: Absence of Hospital-Acquired Illness or Injury  Outcome: Progressing  Intervention: Identify and Manage Fall Risk  Recent Flowsheet Documentation  Taken 04/03/2024 1200 by Trudy Fusi, RN  Safety Interventions:   aspiration precautions   seizure precautions  Taken 04/03/2024 1000 by Trudy Fusi, RN  Safety Interventions:   aspiration precautions   seizure precautions  Intervention: Prevent Skin Injury  Recent Flowsheet Documentation  Taken 04/03/2024 1200 by Trudy Fusi, RN  Positioning for Skin: Right  Taken 04/03/2024 1000 by Trudy Fusi, RN  Positioning for Skin: Supine/Back  Goal: Optimal Comfort and Wellbeing  Outcome: Progressing  Goal: Readiness for Transition of Care  Outcome: Progressing  Goal: Rounds/Family Conference  Outcome: Progressing     Problem: Adult Inpatient Plan of Care  Goal: Plan of Care Review  Outcome: Progressing  Flowsheets (Taken 04/03/2024 1725)  Outcome Evaluation: pt alert and oriented times 4, withdrawn, EEG leads intact, no seizure activity noted, no falls or injuries, pt several loose stools, dry and scaly skin, c/o of pain in right foot, pt think it gout flare up and administered medication for foot, GI consult placed, no pressure injury pressent, seizure precaution maintain(side rail up and padded time 4, bed in lowest position, suction and oxygen at bedside) will continue to monitor     Problem: Adult Inpatient Plan of Care  Goal: Patient-Specific Goal (Individualized)  Outcome: Progressing     Problem: Adult Inpatient Plan of Care  Goal: Absence of Hospital-Acquired Illness or Injury  Intervention: Prevent Skin Injury  Recent Flowsheet Documentation  Taken 04/03/2024 1200 by Trudy Fusi, RN  Positioning for Skin: Right  Taken 04/03/2024 1000 by Trudy Fusi, RN  Positioning for Skin: Supine/Back     Problem: Fall Injury Risk  Goal: Absence of Fall and Fall-Related Injury  Outcome: Progressing  Intervention: Promote Injury-Free Environment  Recent Flowsheet Documentation  Taken 04/03/2024 1200 by Trudy Fusi, RN  Safety Interventions:   aspiration precautions   seizure precautions  Taken 04/03/2024 1000 by Trudy Fusi, RN  Safety Interventions:   aspiration precautions   seizure precautions     Problem: Seizure, Active Management  Goal: Absence of Seizure/Seizure-Related Injury  Outcome: Progressing  Intervention: Prevent Seizure-Related Injury  Recent Flowsheet Documentation  Taken 04/03/2024 1000 by Trudy Fusi, RN  Seizure Precautions:   activity supervised   clutter-free environment maintained

## 2024-04-04 LAB — COMPREHENSIVE METABOLIC PANEL
ALBUMIN: 2.6 g/dL — ABNORMAL LOW (ref 3.4–5.0)
ALKALINE PHOSPHATASE: 65 U/L (ref 46–116)
ALT (SGPT): 12 U/L (ref 10–49)
ANION GAP: 11 mmol/L (ref 5–14)
AST (SGOT): 19 U/L (ref <=34)
BILIRUBIN TOTAL: 0.3 mg/dL (ref 0.3–1.2)
BLOOD UREA NITROGEN: 6 mg/dL — ABNORMAL LOW (ref 9–23)
BUN / CREAT RATIO: 5
CALCIUM: 8.8 mg/dL (ref 8.7–10.4)
CHLORIDE: 112 mmol/L — ABNORMAL HIGH (ref 98–107)
CO2: 22 mmol/L (ref 20.0–31.0)
CREATININE: 1.17 mg/dL (ref 0.73–1.18)
EGFR CKD-EPI (2021) MALE: 71 mL/min/1.73m2 (ref >=60)
GLUCOSE RANDOM: 178 mg/dL (ref 70–179)
POTASSIUM: 2.9 mmol/L — ABNORMAL LOW (ref 3.4–4.8)
PROTEIN TOTAL: 7.3 g/dL (ref 5.7–8.2)
SODIUM: 145 mmol/L (ref 135–145)

## 2024-04-04 LAB — CBC
HEMATOCRIT: 35.7 % — ABNORMAL LOW (ref 39.0–48.0)
HEMOGLOBIN: 11.5 g/dL — ABNORMAL LOW (ref 12.9–16.5)
MEAN CORPUSCULAR HEMOGLOBIN CONC: 32.3 g/dL (ref 32.0–36.0)
MEAN CORPUSCULAR HEMOGLOBIN: 27.2 pg (ref 25.9–32.4)
MEAN CORPUSCULAR VOLUME: 84.2 fL (ref 77.6–95.7)
MEAN PLATELET VOLUME: 7.9 fL (ref 6.8–10.7)
PLATELET COUNT: 280 10*9/L (ref 150–450)
RED BLOOD CELL COUNT: 4.23 10*12/L — ABNORMAL LOW (ref 4.26–5.60)
RED CELL DISTRIBUTION WIDTH: 16.6 % — ABNORMAL HIGH (ref 12.2–15.2)
WBC ADJUSTED: 6.3 10*9/L (ref 3.6–11.2)

## 2024-04-04 LAB — POTASSIUM: POTASSIUM: 3.2 mmol/L — ABNORMAL LOW (ref 3.4–4.8)

## 2024-04-04 LAB — MAGNESIUM: MAGNESIUM: 1.5 mg/dL — ABNORMAL LOW (ref 1.6–2.6)

## 2024-04-04 LAB — PHOSPHORUS: PHOSPHORUS: 2.6 mg/dL (ref 2.4–5.1)

## 2024-04-04 LAB — AMMONIA: AMMONIA: 39 umol/L — ABNORMAL HIGH (ref 11–32)

## 2024-04-04 MED ADMIN — metoPROLOL succinate (Toprol-XL) 24 hr tablet 25 mg: 25 mg | ORAL | @ 14:00:00

## 2024-04-04 MED ADMIN — cloBAZam (ONFI) split tablet 5 mg: 5 mg | ORAL | @ 14:00:00

## 2024-04-04 MED ADMIN — levETIRAcetam (KEPPRA) tablet 1,500 mg: 1500 mg | ORAL | @ 01:00:00

## 2024-04-04 MED ADMIN — potassium chloride 10 mEq in 100 mL IVPB: 10 meq | INTRAVENOUS | @ 18:00:00 | Stop: 2024-04-04

## 2024-04-04 MED ADMIN — empagliflozin (JARDIANCE) tablet 25 mg: 25 mg | ORAL | @ 14:00:00

## 2024-04-04 MED ADMIN — potassium chloride 10 mEq in 100 mL IVPB: 10 meq | INTRAVENOUS | @ 21:00:00 | Stop: 2024-04-04

## 2024-04-04 MED ADMIN — atorvastatin (LIPITOR) tablet 40 mg: 40 mg | ORAL | @ 14:00:00

## 2024-04-04 MED ADMIN — cloBAZam (ONFI) split tablet 5 mg: 5 mg | ORAL | @ 01:00:00

## 2024-04-04 MED ADMIN — lacosamide (VIMPAT) tablet 200 mg: 200 mg | ORAL | @ 14:00:00

## 2024-04-04 MED ADMIN — furosemide (LASIX) tablet 20 mg: 20 mg | ORAL | @ 14:00:00

## 2024-04-04 MED ADMIN — apixaban (ELIQUIS) tablet 5 mg: 5 mg | ORAL | @ 14:00:00

## 2024-04-04 MED ADMIN — colchicine (COLCRYS) tablet 0.6 mg: .6 mg | ORAL | @ 14:00:00 | Stop: 2024-04-06

## 2024-04-04 MED ADMIN — lisinopril (PRINIVIL,ZESTRIL) tablet 10 mg: 10 mg | ORAL | @ 14:00:00

## 2024-04-04 MED ADMIN — bictegrav-emtricit-tenofov ala (BIKTARVY) 50-200-25 mg tablet 1 tablet: 1 | ORAL | @ 14:00:00

## 2024-04-04 MED ADMIN — insulin glargine (LANTUS) injection 12 Units: 12 [IU] | SUBCUTANEOUS | @ 02:00:00

## 2024-04-04 MED ADMIN — sertraline (ZOLOFT) tablet 50 mg: 50 mg | ORAL | @ 14:00:00

## 2024-04-04 MED ADMIN — potassium chloride 10 mEq in 100 mL IVPB: 10 meq | INTRAVENOUS | @ 16:00:00 | Stop: 2024-04-04

## 2024-04-04 MED ADMIN — levETIRAcetam (KEPPRA) tablet 1,500 mg: 1500 mg | ORAL | @ 14:00:00

## 2024-04-04 MED ADMIN — lacosamide (VIMPAT) tablet 200 mg: 200 mg | ORAL | @ 01:00:00

## 2024-04-04 MED ADMIN — potassium chloride 10 mEq in 100 mL IVPB: 10 meq | INTRAVENOUS | @ 14:00:00 | Stop: 2024-04-04

## 2024-04-04 MED ADMIN — pantoprazole (Protonix) EC tablet 40 mg: 40 mg | ORAL | @ 14:00:00

## 2024-04-04 NOTE — Unmapped (Signed)
 NMB (Neurology Team B)  Physician Discharge Summary        Admit date: 03/30/2024    Discharge date: 04/06/2024    Discharge to: Home    Discharge Service: NEU Neurology Team B (NMB)    Discharge Physician: Dr. Almodovar    Discharge Diagnoses:   Primary discharge diagnosis: Focal Seizures (POA)  Secondary discharge diagnoses: HIV (POA), Diarrhea (POA)    Principal Problem:    Focal motor seizure  Active Problems:    Gout    Human immunodeficiency virus (HIV) disease    Type 2 diabetes mellitus    Diarrhea       Follow-up Issues:   Outpatient follow up:  [ ]  Neurology:  seizures, aseptic meningitis, follow up pending labs (AI/PNP panel)  [ ]  Gastroenterology:  diarrhea, colonoscopy  [ ]  Cardiology:  discuss need for antiplatelet therapy for cardiac stent  [ ]  PCP:  hyperglycemia and issue refilling insulin        Procedures: lumbar puncture cvEEG    Consults: Infectious Disease and GI    Pertinent Test Results:     Last CSF Analysis:   Lab Results   Component Value Date    COLORCSF Colorless 03/31/2024    COLORCSF Colorless 03/31/2024    APPEARCSF Clear 03/31/2024    APPEARCSF Clear 03/31/2024    NUCCELLSCSF 22 (H) 03/31/2024    NUCCELLSCSF 13 (H) 03/31/2024    RBCCSF 30 (H) 03/31/2024    RBCCSF <2 03/31/2024    LYMPHSCSF 85.0 03/31/2024    LYMPHSCSF 85.0 03/31/2024    MONOMACCSF 15.0 03/31/2024    MONOMACCSF 15.0 03/31/2024    PROTCSF 42 03/31/2024    GLUCCSF 94 (H) 03/31/2024       ID Serologies:   Lab Results   Component Value Date    Hepatitis A IgG Reactive (A) 08/16/2014    Hep B S Ab Reactive (A) 07/08/2019    Hep B Core Total Ab Reactive (A) 07/08/2019    Hepatitis C Ab Nonreactive 12/11/2023    Hepatitis C Ab Negative 08/16/2014    Rubella IgG Scr Negative 12/11/2023    Quantiferon Mitogen Minus Nil 9.99 12/11/2023    Quantiferon Antigen 1 minus Nil 0.00 12/11/2023       HIV Labs:   Absolute CD4 Count   Date Value Ref Range Status   03/30/2024 690 510 - 2,320 /uL Final   12/11/2023 640 510 - 2,320 /uL Final 09/15/2016 440 (L) 510 - 2,320 /uL Final   08/16/2014 546 510 - 2,320 /uL Final   10/05/2013 368 (L) 510 - 2,320 /uL Final   03/16/2013 612 510 - 2,320 CELLS/UL Final     CD4% (T Helper)   Date Value Ref Range Status   03/30/2024 30 (L) 34 - 58 % Final   12/11/2023 40 34 - 58 % Final   09/15/2016 40 34 - 58 % Final   08/16/2014 37 34 - 58 % Final   10/05/2013 39 34 - 58 % Final   03/16/2013 39 34 - 58 % OF LYMPHS Final     HIV RNA Quant Result   Date Value Ref Range Status   03/30/2024 Detected (A) Not Detected Final   12/11/2023 Not Detected Not Detected Final   08/16/2014 Not Detected  Final   10/05/2013 Not Detected  Final   03/16/2013 Not Detected  Final     HIV RNA   Date Value Ref Range Status   03/30/2024 1,369 (H) <0 copies/mL Final  HIV RNA Log10   Date Value Ref Range Status   03/30/2024 3.14 (H) <0.00 log copies/mL Final     Gonorrhoeae NAA   Date Value Ref Range Status   12/11/2023 Negative Negative Final   12/11/2023 Negative Negative Final   12/11/2023 Negative Negative Final        MRI Brain W Wo Contrast   Final Result   No acute intracranial abnormality.      No discrete epileptogenic focus to explain patient's seizures.      Chronic small vessel ischemic changes.      MRI brain without contrast   Final Result   No acute intracranial abnormality.   No evidence of mass lesion however sensitivity is limited by lack of intravenous contrast.             MRI Cervical Spine Wo Contrast   Final Result   Limited examination. The examination had to be terminated early as the patient had multiple full body spasms in the scanner and was at risk of themselves. Only a T1 and T2 sagittal sequence could be completed.      Within these limitations there is no abnormal signal in the spinal cord and mild degenerative disc disease of the cervical spine.               Pending Test Results:   Pending Labs       Order Current Status    Comprehensive Ova and Parasite Exam In process    Encephalopathy, Autoimmune/Paraneoplastic Eval, CSF In process    Encephalopathy, Autoimmune/Paraneoplastic Eval, Serum In process    Blood Culture #1 Preliminary result    Blood Culture #2 Preliminary result    CSF Culture Preliminary result            Hospital Course:   Outpatient follow up:  [ ]  Neurology:  seizures, aseptic meningitis, follow up pending labs (AI/PNP panel)  [ ]  Gastroenterology:  diarrhea  [ ]  Cardiology:  overdue for 1 year follow up for CAD, HFpEF, discuss need for antiplatelet therapy for cardiac stent  [ ]  PCP:  hyperglycemia and issue refilling insulin , diarrhea    Ryan Davenport is a 63 y.o. male with a history of HIV on Biktarvy , DVT on AC, multiple prior infections (CMV right retinitis, molluscum contagium, secondary syphillis, septic arthritis), and T2DM who [facility] for new onset focal seizures thought to be secondary to viral meningitis based on CSF studies although no virus was identified.  ID was consulted.  Seizures were difficult to control on multiple ASM and patient should continue these until he can be seen in outpatient neurology clinic.  Course was complicated by diarrhea, for which GI was consulted.  He discharged home.    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  EEG showed left parasagittal focal electro-clinical seizures with 5 ASM initially required for control.  MRI brain with contrast unremarkable.  CSF with 13 nucleated cells with lymphocytic predominance raises suspicion for viral meningo-encephalitis as cause of acute symptomatic seizures in the setting of diarrhea although no virus identified.  ID was consulted as below.  Patient should continue on ASM regimen below until neurology follow up when Keppra  should be weaned first.    ASM regimen:   - Keppra   1.5 g twice daily  -- Onfi  5 mg twice daily    Workup:  -- MRI brain w/wo 04/01/24 (epilepsy protocol): Unremarkable  --EEG (4/7-): Frequent focal electroclinical seizures arising from the left parasagittal region with clinical correlate of right arm flexion and clonic activity  -- Serum labs: WBC 6.2, CD4 count 690, HIV RNA 1369,  - CSF labs: 13 nucleated cells, less than 2 RBC, protein 42, glucose 94, culture no growth to date, CMV negative, EBV negative, enterovirus negative, HHV-6 negative, HSV 1/2 negative, VZV negative, crypto antigen negative, cytology with no malignant cells and mostly small lymphocytes without viral cytopathic effect, heme path with no blasts or abnormal lymphoid cells or monotypic B/T cells  -- Stool: GI pathogen panel negative  -- UA noninfectious    #Diarrhea  Patient has acute on chronic diarrhea. No blood in stool at home or inpatient. He states that taking large amounts of medications orally with no food is making his diarrhea worse. GIPP negative.  O&P pending.  GI was consulted.  Diarrhea is improving, can follow up outpatient with PCP.     #T2DM  History of poorly controlled type 2 diabetes with A1c 12 (increased from 6.4% 3 months prior).  Initial presentation with hyperglycemia at [facility], 4/5 to 500s; patient states this is due to recent medication non-compliance from difficulty with insurance approval of his insulin , which has been resolved.  On 12 units Lantus  nightly, recently restarted 4/1, and 5 units lispro 3 times daily AC.      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  Medical history complicated by multiple infections including right CMV retinitis, molluscum contagiosum, and secondary syphilis, all status post treatment.  Continued home Biktarvy . Patient received empiric acyclovir  until HSV labs returned negative.  ID was consulted when patient arrived due to concern for infectious cause of seizures given history of HIV.  Continue to follow with ID outpatient.     #CAD s/p stent 2021   #HLD  #HTN  #HFpEF  History of CAD ISO HLD s/p stent 2021, though not previously on aspirin at home.  Continued  home atorvastatin , Metoprolol , Lisinopril  and lasix .  Patient to discuss with outpatient cardiology to determine if needing ASA, he is overdue for 12 month follow up (last visit 09/2022).    #Hx DVT/PE  DVT 2012, on long-term anticoagulation as per hematology.  Held inpatient for LP and restarted on discharge.     #Hx Gout:  Restarted home colchicine  for gout flare in his right foot    #Depression:  Continued home sertraline  50 mg daily     Condition at Discharge: good    Discharge Exam:   Physical Exam:  General Appearance: in no acute distress, well appearing, and alert. Normal skin color, afebrile.  HEENT: Head is atraumatic and normocephalic. Sclera anicteric without injection. Oropharyngeal membranes are moist with no erythema or exudate.  Neck: Grossly normal range of motion.  Lungs: Normal work of breathing.  Heart: warm, well perfused.  Abdomen: Soft, nontender, nondistended.  Extremities: No clubbing, cyanosis, or edema. Mild tenderness to right hallux and ankle to palpation.     NEUROLOGICAL EXAMINATION:      General/Mental Status:    Alert, conversant, able to follow conversation and interview.     Comprehension was intact.   Attention span and concentration were mildly diminished.    Spontaneous speech was with mild dysarthria.  Cranial Nerves:   II, III - Pupils are equal and reactive to light b/l (direct and consensual reactions). No visual field defects.   III, IV, VI - Pursuit eye movements were uninterrupted with full range and without more than end-gaze nystagmus. Extra ocular movements are intact, no ptosis, no diplopia, no nystagmus.  V - Sensation of the face intact b/l to light touch in all three divisions of CNV.   VII - Face symmetrical at rest, no facial droop. Normal facial movements bilaterally including forehead, eye closure, and smile/grimace.  VIII - Hearing grossly intact to conversation.   IX and X - Palate movement is symmetric, no dysarthria.  XI - Full shoulder shrug bilaterally; normal tone and strength of sternocleidomastoid muscles bilaterally.  XII - No tongue atrophy, no tongue fasciculations; tongue protrudes midline, full range of movements of the tongue.     Motor:   Normal bulk. No tremors, myoclonus, or other adventitious movement.  Pronator drift is absent. Fasciculations not observed.      Muscle strength:  LUE 5/5  RUE 1/5 at deltoid, bicep, tricep. 2/5 at [facility], ECR, and hand grip.  BLE 5/5.      Sensory:                UEs LEs     R L R L   Light touch WNL WNL WNL WNL      Cerebellar/Coordination/Gait:  Normal gait    Discharge Medications:     Your Medication List        PAUSE taking these medications      RYBELSUS  14 mg Tab  Wait to take this until your doctor or other care provider tells you to start again.  Generic drug: semaglutide   Take 14 mg by mouth daily.            START taking these medications      cloBAZam  10 mg Tab  Commonly known as: ONFI   Take 0.5 (one-half) tablet (5 mg total) by mouth two (2) times a day.     levETIRAcetam  750 MG tablet  Commonly known as: KEPPRA   Take 2 tablets (1,500 mg total) by mouth two (2) times a day.            CHANGE how you take these medications      DUPIXENT  SYRINGE 300 mg/2 mL syringe  Generic drug: dupilumab   Inject 2 mL (300 mg total) under the skin every fourteen (14) days. For maintenance dose  What changed: Another medication with the same name was removed. Continue taking this medication, and follow the directions you see here.            CONTINUE taking these medications      atorvastatin  40 MG tablet  Commonly known as: LIPITOR   Take 1 tablet (40 mg total) by mouth daily.     betamethasone  dipropionate 0.05 % cream  Apply 1 Application topically two (2) times a day.     BIKTARVY  50-200-25 mg tablet  Generic drug: bictegrav-emtricit-tenofov ala  Take 1 tablet by mouth daily.     blood-glucose meter kit  Use as instructed     cetirizine  10 MG tablet  Commonly known as: ZYRTEC   Take 1 tablet (10 mg total) by mouth two (2) times a day.     colchicine  0.6 mg tablet  Commonly known as: COLCRYS   TAKE 1 TABLET BY MOUTH DAILY AS NEEDED FOR GOUT     ELIQUIS  5 mg Tab  Generic drug: apixaban   Take 1 tablet (5 mg total) by mouth two (2) times a day.     empagliflozin 25 mg tablet  Commonly known as: JARDIANCE  Take 1 tablet (25 mg total) by mouth daily.     furosemide 20 MG tablet  Commonly known as: LASIX  Take 1 tablet (20 mg total) by mouth daily as needed.     gabapentin 100 MG capsule  Commonly known as: NEURONTIN  Take 1 capsule (100 mg total) by mouth Three (3) times a day.     glucose blood test strip  Generic drug: blood sugar diagnostic  Use to check blood sugar as directed with insulin 3 times a day & for symptoms of high or low blood sugar.     hydrocortisone 2.5 % ointment  Apply 1 Application topically two (2) times a day. To active areas of the face until smooth     hydrOXYzine 25 MG tablet  Commonly known as: ATARAX  Take 1 tablet (25 mg total) by mouth every six (6) hours as needed.     insulin aspart 100 unit/mL (3 mL) injection pen  Commonly known as: NovoLOG FLEXPEN  Inject 0.05 mL (5 Units total) under the skin Three (3) times a day before meals.     insulin glargine 100 unit/mL (3 mL) injection pen  Commonly known as: BASAGLAR, LANTUS  Inject 0.12 mL (12 Units total) under the skin nightly.     lancets Misc  Use to check blood sugar as directed with insulin 3 times a day & for symptoms of high or low blood sugar.     lisinopril 10 MG tablet  Commonly known as: PRINIVIL,ZESTRIL  Take 1 tablet (10 mg total) by mouth daily.     methocarbamol 750 MG tablet  Commonly known as: ROBAXIN  750mg  4x daily prn muscle spasms     metoPROLOL succinate 25 MG 24 hr tablet  Commonly known as: Toprol-XL  Take 0.5 tablets (12.5 mg total) by mouth daily.     omeprazole 40 MG capsule  Commonly known as: PriLOSEC  40mg  BID     pen needle, diabetic 32 gauge x 5/32 (4 mm) Ndle  Use with insulin up to 4 times/day as needed.     sertraline 50 MG tablet  Commonly known as: ZOLOFT  Take 1 tablet (50 mg total) by mouth daily.     triamcinolone 0.1 % ointment  Commonly known as: KENALOG  Apply topically two (2) times a day. To active areas of trunk and extremities until smooth              Discharge Instructions:     Other Instructions       Discharge instructions      You were admitted to [facility] Neurology Service with a diagnosis of focal seizures with right arm jerking.  We think you had viral meningitis based on your abnormal spinal fluid.  We had to use several seizure medications to control your seizures.  You should continue these medications until you are reassessed by neurology in clinic.      MEDICATION CHANGES:  -- These are detailed in this discharge packet including new medications to take, previous medications to stop or change, and previous medications to continue.    Follow Up Appointments:    PCP:  Call your primary care provider to schedule a hospital follow up appointment within 2 weeks    Neurology:  You will be contacted to schedule your hospital follow-up appointment with the [facility]  Neurology Clinic within 1-2 weeks after discharge, but if for some reason you do not receive a call, please call the [facility] Neurology Clinic to schedule an appointment at [phone].    Cardiology: Your last appointment with cardiology was in 2023 and you are overdue for a 1 year follow-up appointment.  Please call your cardiologist with [facility] and schedule follow-up.  Please ask them if you should be taking a baby aspirin every day for your cardiac stent.    Gastroenterology:  You will be contacted to schedule a follow-up appointment with the [facility] Gastroenterology Clinic for your diarrhea.  They would like to schedule a colonoscopy as well.    Infectious Disease:  Please keep your June appointment with [facility] Infectious Disease    For all other post-discharge medical questions regarding your hospital stay, diagnosis, symptoms, or medications, please call the Neurology Clinic at [phone] and ask to speak with Dr. Enrique Harvest, who was the senior resident taking care of you during this hospitalization.    For questions regarding outpatient physical therapy/occupational therapy/speech therapy appointments, please call:  [facility] - [phone]    For questions regarding scheduling follow up MRI scans call [phone].    For questions regarding scheduling follow up EEG or EMG/NCS tests call [phone].    -----------  Seizures may happen at any time. It is important to take certain precautions to maintain your safety. When possible, take showers instead of baths, as it is possible to drown in even shallow water during a seizure. Do not swim unsupervised or in open water where rescue could be difficult. Do not climb to heights and do not operate heavy machinery. When cooking, use the back burners of the stove and avoid open flames or hot stove tops. Avoid any activities which could be dangerous in the event of a loss of consciousness.    Typically the DMV wants seizure patients to wait at least 6 months since their last seizure and/or 6 months since their last medication adjustment to resume driving. Please consult the DMV before driving.    If you have a seizure, someone should:   - lay you down on your side   - do NOT put anything in your mouth   -  wait for the seizure to stop on its own for 5 minutes   - if seizure continues for 5 minutes, call 911.   - if seizure happens a second time without the patient waking up and returning to normal mental status, call 911   - if seizures stops on its own before 5 minutes, call [facility] neurology clinic to leave message for your neurologist at [phone]. Follow Up instructions and Outpatient Referrals     Ambulatory Referral to Gastroenterology      Reason for referral: diarreha, consider colonoscopy    Ambulatory Referral to Occupational Therapy      Suggest Treatment:  ADL Training/Adaptive Equipment Control  Evaluation with suggestions for treatment       Reason for referral: OT Treatment Diagnosis: Cognitive changes due to   medical disorder, Limitation of activities due to disability, Need for   assistance with personal care, Reduced mobility, Unsteadiness on feet    Ambulatory Referral to Physical Therapy      Suggest Treatment: Evaluation with suggestions for treatment    Procedures: Strengthening    Reason for referral: weakness, unsteady gait    Discharge instructions        Appointments which have been scheduled  for you      Jun 03, 2024 1:00 PM  (Arrive by 12:45 PM)  Return Specialty Id with Kaylor E Wright, MD  [facility] INFECTIOUS DISEASES [place] [facility] REGION) [address]  [facility] 1 through 4  [place] [address]  [phone]        Jun 03, 2024 1:55 PM  (Arrive by 1:40 PM)  RETURN CONTINUITY with Consuelo Denmark, [place]  [facility] INTERNAL MEDICINE [place] [facility] REGION) [address]  [facility] 1 through 4  [place] [address]  [phone]        Jun 11, 2024 2:30 PM  (Arrive by 2:15 PM)  RETURN  COMPLEX with Kay Parson, MD  [facility] DERMATOLOGY [facility] [place] [facility] REGION) [address]  [place] [address]  [phone]        Jun 12, 2024 3:00 PM  (Arrive by 2:45 PM)  RETURN  GENERAL with Pink Bridges, MD  [facility] GI MEDICINE [place] [facility] REGION) [address]  [facility] 1 through 4  [place] [address]  [phone]              Resources and Referrals       Commode      Length of Need: 99 months    Type of commode: Chair    3 in 1 bedside commode        3 in 1 bedside commode    Walker Type: Rolling    Wheels: 5 Fixed    Length of Need: 99 months              I spent greater than 30 minutes in the discharge of this patient.    Mannie Seek , MD  PGY-3 Resident        I saw and evaluated the patient, participating in the key portions of the service on the day of discharge.  I reviewed the resident???s note and agree with the discharge plans and disposition. I personally spent 15 minutes in discharge planning services.     63 year old man with right upper extremity seizures    Now on keppra  and onfi   Csf with 13 cells, diarrhea, viral encephalitis.   MRI clear    Back to baseline, discharge home today with Antiseizure meds.       Jehad Bisono L Almodovar Suarez, MD

## 2024-04-04 NOTE — Unmapped (Signed)
 DAILY REAL TIME INTERIM LONGTERM VIDEO EEG MONITORING NOTE    Identifying Information   NAME: Ryan Davenport    MRN: [id number]   DOB: 1961-06-18      LOC: 6126/6126-01    HISTORY: 63 y.o. male with new onset of episodes of pain and shaking of the right arm     INDICATION:  Seizures     PATIENT STATE: Awake, Drowsy, and Sleep     Study Information  EEG Start:   March 30, 2024 at 1055  EEG End: March 30, 2024  at     [facility] 4/7 at 1055 to 4/7 at 2100  [facility] 4/7/at 2100 to TBD     EEG TECHNICAL DESCRIPTION   Conditions of Recording:  Continuous EEG with simultaneous video recording was performed utilizing 21 active electrodes placed according to the international 10-20 system.  The study was recorded digitally with a bandpass of 1-70Hz  and a sampling rate of 200Hz  and was reviewed with the possibility of multiple reformatting.  The study was digitally processed with potential spike and seizure events identified for physician analysis and review.  Patient recognized events were identified by a push button marker and reviewed by the physician.   Simultaneous video was reviewed for all patient events.    EEG DESCRIPTION:       Initial Background Activity (see Daily Interpretation below for description of background changes that occur as the recording progresses)     Frequency (maximal in waking or arousal):  During waking there is 9 Hz posterior dominant alpha activity with appropriate organization and anterior-posterior voltage and frequency gradients.        PDR:  Present    Asymmetry/ Focal slowing: No    Amplitude: Normal (20 - 150 uV)    Continuity:  Continuous     Sleep Activity/ State Change: State change present with normal N2 sleep (sleep spindles and vertex waves)    Reactivity: Present     Periodic/ Rhythmic Patterns/ Epileptiform Activity:  No.    Seizures: Yes. There were frequent electroclinical seizures starting over the left parasagittal region and consisting of rhythmic beta activity evolving to slower frequency low amplitude spike discharges, duration 40 seconds.    Clinically the patient cried out and there is tonic flexion of the RUE followed by rapid clonic movements.  Initially seizures were 3-4 per hour, decreasing to about 1 per hour after 1500 on 4/7      Patient Events: No.    DAY 1:  EEG INTERPRETATION:  March 30, 2024 at  1055  to March 30, 2024 at 2015 [facility])     Relevant Medications: Keppra , Vimpat  300 mg load at 1844 on 4/7    Abnormal due to:     There were frequent electroclinical seizures starting over the left parasagittal region and consisting of rhythmic beta activity evolving to slower frequency low amplitude spike discharges, duration 40-60 seconds.  Clinically the patient cried out and there is tonic flexion of the RUE followed by rapid clonic movements.   Initially 3-4 per hour decreasing to about 1 per hour after 1500      Disconnected for transfer 1730 to 1820       DAY 2:  EEG INTERPRETATION:  March 30, 2024 at  1055  to March 30, 2024 at 2015 [facility])     Relevant Medications: Keppra , Vimpat  300 mg load at 1844 on 4/7    Abnormal due to:     There were frequent electroclinical seizures starting  over the left parasagittal region and consisting of rhythmic beta activity evolving to slower frequency low amplitude spike discharges, duration 40-60 seconds.  Clinically the patient cried out and there is tonic flexion of the RUE followed by rapid clonic movements.   Initially 3-4 per hour decreasing to about 1 per hour after 1500      Disconnected for transfer 1730 to 1820       DAY 3:  EEG INTERPRETATION:  March 30, 2024 at  2015  to March 31, 2024 at 1000 (Richel Millspaugh)     Relevant Medications: Keppra , Vimpat , received 2 mg lorazepam   Abnormal due to:     Background similar to prior days, there was one seizure at 21:15 hrs that lasted few seconds.  Patient was given lorazepam  2 mg and background with no seizures after until 2 am when discharges are seen over the left parasagittally region that are persistent. These discharges continued until 6 am to then resolving and re starting at 7 am until 8:41 hrs on April 8th.  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  No more discharges are seen after 8:41 hrs and no seizures are seen after.        DAY 4:  EEG INTERPRETATION:  March 31, 2024 at  1000  to April 01, 2024 at 0800 (Clementine Soulliere)     Relevant Medications: Keppra , Vimpat , Valproate     Abnormal due to:     Background similar to prior days and on April 8 at 14:55 hrs while the resident was at the bedside examining the patient. The patient had myoclonic movements on the right hand for what Valproate load was given.  EEG has muscle artifact obscuring the background but there are high amplitude discharges seen that back log with the myoclonic movement.    Seizure lasted few sec, no more seizures or event buttons were seen after       DAY 5:  EEG INTERPRETATION:  April 01, 2024 at  0800  to April 02, 2024 at 0800 (Amiera Herzberg)     Relevant Medications: Keppra , Vimpat , Valproate, clobazam , clonazepam     Abnormal due to:   - mild diffuse slowing as the background is formed by alpha frequencies with admixed theta    No electrographic seizures were seen       DAY 6:  EEG INTERPRETATION:  April 02, 2024 at  0800  to April 03, 2024 at 0800 (Andersen Mckiver)     Relevant Medications: Keppra , Vimpat , Valproate, clobazam ,  Abnormal due to:   - Mild diffuse slowing as the background is formed by alpha frequencies     No electrographic seizures were seen         DAY 7:  EEG INTERPRETATION:  April 03, 2024 at  0800  to April 04, 2024 at 0800 (Zekiah Coen)     Relevant Medications: Keppra , Vimpat , Clobazam ,  Abnormal due to:   - Mild diffuse slowing as the background is formed by alpha frequencies     No electrographic seizures were seen       CLINICAL CORRELATION/ SUMMARY     These findings are consistent with frequent focal electroclinical seizures arising from the left parasagittal region with clinical correlate of right arm flexion and clonic activity that improved in frequency. Last seizure was seen on 14:55 hrs on April 8 characterized by myoclonic movements of few seconds.  Interpreting Attending: Benita Bramble, MD         2HELPS2B Seizure Risk Score  Clinical risk score based on EEG findings and clinical history of seizures to aid in determination of optimal duration of EEG monitoring for detection of electrographic seizures.  Score has not been validated in patients under age 70 or following cardiac arrest.    NOT APPLICABLE FOR THIS PATIENT     Add 1 point if known history of epilepsy or prior clinical seizure.      Risk Group     Seizure Risk   at 72 hours   Duration of Monitoring    Seizure risk < 5%     Duration of Monitoring    Seizure risk < 2%       Low Risk,   Score = 0     3.1%   1 Hour   3.3 Hours     Medium Risk,   Score = 1     12%   12 Hours   29 Hours     High Risk,   Score = 2 or greater                >25%   >24 Hours   >30 Hours   Struck et al JAMA Neurology, January 2020    2HELPS2B Seizure Risk Score is not appropriate when EEG monitoring is being performed for the following indications:  Treatment of status epilepticus, seizures already documented on EEG or Ictal Interictal continuum pattern (IIC)  Monitoring sedation/ burst suppression for management of intracranial pressure and/or paralyzed patients  Diagnostic evaluation of transient episodes concerning for possible seizures (spell capture)   Patients s/p cardiac arrest undergoing targeted temperature management      Cherrie Cornwall al. American Clinical Neurophysiology Society's Standardized Critical Care EEG Terminology: 2021 Version. Journal of Clinical Neurophysiology 38(1):p 1-29, January 2021.   ACNS STANDARDIZED ICU EEG NOMENCLATURE v ([email or web address])

## 2024-04-04 NOTE — Unmapped (Signed)
 Neurology Inpatient Team B (NMB)  Daily Progress Note       Patient: Ryan Davenport  Code Status: Full Code  Level of Care: Acute floor status.   LOS: 5 days      Overnight Events & Subjective:     - Patient is alert and oriented. He denies tonic / clonic movements in his right arm or hands.   - Patient states he has slightly improved diarrhea.   - Gout symptoms have improved but still endorses some pain.      Physical Exam:   Physical Exam:  General Appearance: in no acute distress, well appearing, and alert,. Normal skin color, afebrile.  HEENT: Head is atraumatic and normocephalic. Sclera anicteric without injection. Oropharyngeal membranes are moist with no erythema or exudate.  Neck: Grossly normal range of motion.  Lungs: Normal work of breathing.  Heart: warm, well perfused.  Abdomen: Soft, nontender, nondistended.  Extremities: No clubbing, cyanosis, or edema. Mild tenderness to right hallux and ankle to palpation.     NEUROLOGICAL EXAMINATION:      General/Mental Status:    Alert, conversant, able to follow conversation and interview.     Comprehension was intact.   Attention span and concentration were mildly diminished.    Spontaneous speech was with mild dysarthria.       Cranial Nerves:   II, III - Pupils are equal and reactive to light b/l (direct and consensual reactions). No visual field defects.   III, IV, VI - Pursuit eye movements were uninterrupted with full range and without more than end-gaze nystagmus. Extra ocular movements are intact, no ptosis, no diplopia, no nystagmus.  V - Sensation of the face intact b/l to light touch in all three divisions of CNV.   VII - Face symmetrical at rest, no facial droop. Normal facial movements bilaterally including forehead, eye closure, and smile/grimace.  VIII - Hearing grossly intact to conversation.   IX and X - Palate movement is symmetric, no dysarthria.  XI - Full shoulder shrug bilaterally; normal tone and strength of sternocleidomastoid muscles bilaterally.  XII - No tongue atrophy, no tongue fasciculations; tongue protrudes midline, full range of movements of the tongue.     Motor:   Normal bulk. No tremors, myoclonus, or other adventitious movement.  Pronator drift is absent. Fasciculations not observed.      Muscle strength:  LUE 5/5  RUE 1/5 at deltoid, bicep, tricep. 2/5 at [facility], ECR, and hand grip.  BLE 5/5.     Reflexes: Deferred     Sensory:           UEs LEs     R L R L   Light touch WNL WNL WNL WNL      Cerebellar/Coordination/Gait:  Deferred       Assessment/Plan:       Assessment: Ryan Davenport is a 63 y.o. 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     ACTIVE PROBLEMS:  #Left parasagittal focal seizures  Initial presentation with 1 week of right upper extremity and facial tonic followed by clonic activity lasting less than 1 minute, though increasing in frequency up to 15 events per day. With EEG showing left parasagittal focal seizures, constitutes left focal seizures correlating with aforementioned semiology, though etiology remains unclear. MRI brain with contrast does not show acute  intracranial abnormality or discrete epileptogenic focus to explain the seizures. Patient continued to have frequent seizures, so Vimpat  was started 4/7 PM, Depakote  started 4/8 AM, Onfi  and Klonopin  started 4/8 PM. Last seizure noted 4/8 PM. With decreased seizure activity for 2 days, discontinued Klonopin  4/10, Depakote  on 4/11. Discontinuing Vimpat  today and continuing EEG monitoring. No seizure noted since 4/8 PM.     With patient's history of HIV (previously AIDS, though CD4 count recovered to 694/7), and history of classically immunocompromised infections (CMV retinitis, molluscum contagiosum, secondary syphilis), could raise suspicion for new CNS infectious etiology. However, MRI brain with and without contrast not showing any focal lesions or bleeds to explain the current symptoms and no systemic signs to explain meningitis or infectious encephalitis. CSF showed elevated cell counts, predominantly lymphocytes raising suspicion for viral meningitis. ID was consulted for further infectious workup given history of HIV. CSF viral PCR negative. They feel infection is less likely or resolved at this time and do not recommend additional treatment.     ASM regimen: (avoiding DDI with Biktarvy )  - Keppra  1 g twice daily  - Onfi  5 mg twice daily  - Discontinue Vimpat    - Discontinued: Klonipin (on 4/10), Depakote  (on 4/11)    Plan:   - Continue EEG while reducing ASM, plan to maintain on Keppra  and Onfi  at discharge and discontinue EEG 4/13    Workup:   -- MRI brain wo 03/30/24: Unremarkable  -- MRI brain w/ 04/01/24 (epilepsy protocol): Unremarkable  --EEG (4/7-): Frequent focal electroclinical seizures arising from the left parasagittal region with clinical correlate of right arm flexion and clonic activity, last seizure 4/814: 55  -- Serum labs: WBC 6.2, CD4 count 690, HIV RNA 1369,  - CSF labs: 13 nucleated cells, less than 2 RBC, protein 42, glucose 94, culture no growth to date, CMV negative, EBV negative, enterovirus negative, HHV-6 negative, HSV 1/2 negative, VZV negative, crypto antigen negative, cytology with no malignant cells and mostly small lymphocytes without viral cytopathic effect, heme path with no blasts or abnormal lymphoid cells or monotypic B/T cells  -- Stool: GI pathogen panel negative  -- UA noninfectious    # Hypokalemia  Potassium 2.9, Phosphorus 2.6 on on 4/12. Hypokalemia most likely due to ongoing and acutely increased diarrhea.   - Ordered potassium repletion 40mEq IV, will recheck potassium 4/12 PM.     #Diarrhea  Patient has had increased urgency and frequency of watery diarrhea since admission. He has had watery diarrhea at home, but less frequently and no urgency. No blood notes in stool at home or inpatient. He states that taking large amounts of medications orally with no food is making his diarrhea worse. GIPP negative. GI is consulted for further recommendations.  - GI consulted; following for recs  - Follow up stool O&P  - Imodium  prn     #Gout  Patient has hx of gout, on home colchicine  0.6 mg tablet daily prn.  On 4/11, patient has pain on right hallux and ankle. On physical exam, moderately tender to palpation, no erythema or swelling. Colchicine  0.6 mg daily until gout resolves.  - Colchicine  0.6 mg daily until gout resolves    Pending Labs       Order Current Status    Comprehensive Ova and Parasite Exam In process    Encephalopathy, Autoimmune/Paraneoplastic Eval, CSF In process    Encephalopathy, Autoimmune/Paraneoplastic Eval, Serum In process    CSF Culture Preliminary result  STABLE PROBLEMS:  #Hx DVT/PE  Chart history of DVT PE 2012, on long-term anticoagulation as per hematology.  Patient states last took Eliquis 03/29/24 afternoon.  - Holding home Eliquis given LP as above     #T2DM  History of poorly controlled type 2 diabetes with A1c 12 (increased from 6.4% 3 months prior).  Initial presentation with hyperglycemia to outside hospital, 4/5 to 500s, subsequently improved.  On 12 units Lantus nightly, recently restarted 4/1, and 5 units lispro 3 times daily AC.  Had insurance issues filling his insulin prescription recently which explains the poor blood sugar control.  Plan to restart Lantus as previously with aggressive sliding scale and up titration of mealtime pending sliding requirements.  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 HIV RNA was detected, but absolute count is 690. Medical history complicated by multiple infections including right CMV retinitis, molluscum contagiosum, and secondary syphilis, all status post treatment. On Biktarvy at home.   - Continue Biktarvy daily    #Depression  -Continue home sertraline 50 mg daily     #CAD s/p stent 2021 #HLD  History of CAD ISO HLD s/p stent 2021, though not previously on aspirin at home.  Per family, not taking an aspirin.  -Consider inpatient vs outpatient cardiology follow up to determine if needing ASA  -Restart home atorvastatin 40 mg daily, Metoprolol 25 mg qd     #HTN  - Lisinopril 10 mg every day, Lasix 20 qd     #GERD  -pharmacy equivalent pantoprazole 40 mg daily (home omeprazole 40 mg daily)     Discharge Planning:   - Case management: N/A  - Social work: N/A  - PT: 5xL  - OT: 5xH  - SLP: N/A  - Expected Discharge Disposition: 4/14    Checklist:  - Diet: Regular diet  - IV fluids: no  - Bowel Regimen: No indication for a bowel regimen at this time (reason: PRN)  - GI PPX: No GI indications  - DVT PPX:  Holding given consideration for LP  - Lines/Access: PIV x1.    - Foley: No     This patient was seen and discussed with Dr. Parker Bollard, who agrees with the above assessment and plan.       Please page Neurology Team B at [phone] for any questions/concerns.    Jiwoo Kim, MS4    I attest that I have reviewed the student note and that the components of the history of present illness, the physical exam, and the assessment and plan documented were performed by me or were performed in my presence by the student where I verified the documentation and performed (or re-performed) the exam and medical decision making.    Loralee Roche PGY-3  Department of Neurology      Data Review:     Contact Information:  Family contact: Aries Townley (Mother, [phone])  PCP: Consuelo Denmark, PA    Medications:  Scheduled medications:    apixaban  5 mg Oral BID    atorvastatin  40 mg Oral Daily    bictegrav-emtricit-tenofov ala  1 tablet Oral Daily    cloBAZam  5 mg Oral BID    colchicine  0.6 mg Oral Daily    empagliflozin  25 mg Oral Daily    furosemide  20 mg Oral Daily    guar gum  1 packet Oral TID    insulin  glargine  12 Units Subcutaneous Nightly    insulin  lispro  0-20 Units Subcutaneous ACHS    lacosamide   200 mg Oral BID    levETIRAcetam   1,500 mg Oral BID    lisinopril   10 mg Oral Daily    metoPROLOL  succinate  25 mg Oral Daily    pantoprazole   40 mg Oral Daily before breakfast    potassium chloride  in water  10 mEq Intravenous Once    Followed by    potassium chloride  in water  10 mEq Intravenous Once    Followed by    potassium chloride  in water  10 mEq Intravenous Once    Followed by    potassium chloride  in water  10 mEq Intravenous Once    sertraline   50 mg Oral Daily     Continuous infusions:   PRN medications: acetaminophen , calcium carbonate, cyclobenzaprine , dextrose in water, glucagon, glucose, hydrOXYzine , lidocaine , loperamide , methocarbamol , polyethylene glycol, senna    24 hour vital signs:  Temp:  [36.8 ??C (98.2 ??F)-37.2 ??C (99 ??F)] 37.2 ??C (99 ??F)  Heart Rate:  [103-108] 108  Resp:  [17-19] 18  BP: (117-175)/(61-90) 117/61  MAP (mmHg):  [74-110] 74  SpO2:  [98 %-99 %] 98 %    Ins and Outs:  No intake/output data recorded.    Laboratory values:  All Labs Last 24hrs:   Recent Results (from the past 24 hours)   POCT Glucose    Collection Time: 04/03/24 11:56 AM   Result Value Ref Range    Glucose, POC 138 70 - 179 mg/dL   POCT Glucose    Collection Time: 04/03/24  6:13 PM   Result Value Ref Range    Glucose, POC 79 70 - 179 mg/dL   POCT Glucose    Collection Time: 04/03/24 10:27 PM   Result Value Ref Range    Glucose, POC 107 70 - 179 mg/dL   Ammonia    Collection Time: 04/04/24  4:41 AM   Result Value Ref Range    Ammonia 39 (H) 11 - 32 umol/L   Comprehensive Metabolic Panel    Collection Time: 04/04/24  4:41 AM   Result Value Ref Range    Sodium 145 135 - 145 mmol/L    Potassium 2.9 (L) 3.4 - 4.8 mmol/L    Chloride 112 (H) 98 - 107 mmol/L    CO2 22.0 20.0 - 31.0 mmol/L    Anion Gap 11 5 - 14 mmol/L    BUN 6 (L) 9 - 23 mg/dL    Creatinine 1.61 0.96 - 1.18 mg/dL BUN/Creatinine Ratio 5     eGFR CKD-EPI (2021) Male 71 >=60 mL/min/1.33m2    Glucose 178 70 - 179 mg/dL    Calcium 8.8 8.7 - 04.5 mg/dL    Albumin 2.6 (L) 3.4 - 5.0 g/dL    Total Protein 7.3 5.7 - 8.2 g/dL    Total Bilirubin 0.3 0.3 - 1.2 mg/dL    AST 19 <=40 U/L    ALT 12 10 - 49 U/L    Alkaline Phosphatase 65 46 - 116 U/L   Magnesium  Level    Collection Time: 04/04/24  4:41 AM   Result Value Ref Range    Magnesium  1.5 (L) 1.6 - 2.6 mg/dL   CBC    Collection Time: 04/04/24  4:41 AM   Result Value Ref Range    WBC 6.3 3.6 - 11.2 10*9/L    RBC 4.23 (L)  4.26 - 5.60 10*12/L    HGB 11.5 (L) 12.9 - 16.5 g/dL    HCT 16.1 (L) 09.6 - 48.0 %    MCV 84.2 77.6 - 95.7 fL    MCH 27.2 25.9 - 32.4 pg    MCHC 32.3 32.0 - 36.0 g/dL    RDW 04.5 (H) 40.9 - 15.2 %    MPV 7.9 6.8 - 10.7 fL    Platelet 280 150 - 450 10*9/L   Phosphorus Level    Collection Time: 04/04/24  4:41 AM   Result Value Ref Range    Phosphorus 2.6 2.4 - 5.1 mg/dL   POCT Glucose    Collection Time: 04/04/24  8:19 AM   Result Value Ref Range    Glucose, POC 111 70 - 179 mg/dL     Imaging:  Pertinent imaging discussed in the A/P section

## 2024-04-04 NOTE — Unmapped (Signed)
 Patient is Ox4 and follows commands. vEEG leads remain in place. Bath given this shift, x1 BM. Able to use urinal, voiding. Tolerating regular diet. VSS, denies need for pain medication. C/O gout to RLE, NMB aware. Fall and seizure precations maintained, no further needs.     Problem: Adult Inpatient Plan of Care  Goal: Plan of Care Review  Outcome: Progressing  Goal: Patient-Specific Goal (Individualized)  Outcome: Progressing  Flowsheets (Taken 04/04/2024 1734)  Patient/Family-Specific Goals (Include Timeframe): patient will eat 75% of 2 meals by 4/13  Goal: Absence of Hospital-Acquired Illness or Injury  Outcome: Progressing  Goal: Optimal Comfort and Wellbeing  Outcome: Progressing  Goal: Readiness for Transition of Care  Outcome: Progressing  Goal: Rounds/Family Conference  Outcome: Progressing     Problem: Latex Allergy  Goal: Absence of Allergy Symptoms  Outcome: Progressing     Problem: Fall Injury Risk  Goal: Absence of Fall and Fall-Related Injury  Outcome: Progressing     Problem: Seizure, Active Management  Goal: Absence of Seizure/Seizure-Related Injury  Outcome: Progressing     Problem: Self-Care Deficit  Goal: Improved Ability to Complete Activities of Daily Living  Outcome: Progressing     Problem: Infection  Goal: Absence of Infection Signs and Symptoms  Outcome: Progressing

## 2024-04-04 NOTE — Unmapped (Signed)
 No falls or injuries noted. Bed in low and locked position. Pt is able to reposition self in the bed. No complaint of pain.  Vitals stable throughout shift. Will continue to monitor.     Problem: Adult Inpatient Plan of Care  Goal: Plan of Care Review  Outcome: Progressing  Goal: Patient-Specific Goal (Individualized)  Outcome: Progressing  Goal: Absence of Hospital-Acquired Illness or Injury  Outcome: Progressing  Intervention: Identify and Manage Fall Risk  Recent Flowsheet Documentation  Taken 04/03/2024 2000 by Curtis Dow, RN  Safety Interventions:   bed alarm   fall reduction program maintained   family at bedside   low bed   lighting adjusted for tasks/safety   nonskid shoes/slippers when out of bed   room near unit station   seizure precautions   other (comment)  Intervention: Prevent and Manage VTE (Venous Thromboembolism) Risk  Recent Flowsheet Documentation  Taken 04/04/2024 0200 by Curtis Dow, RN  Anti-Embolism Device Type: SCD, Knee  Anti-Embolism Device Status: Refused  Anti-Embolism Device Location: BLE  Taken 04/04/2024 0000 by Curtis Dow, RN  Anti-Embolism Device Type: SCD, Knee  Anti-Embolism Device Status: Refused  Anti-Embolism Device Location: BLE  Taken 04/03/2024 2200 by Curtis Dow, RN  Anti-Embolism Device Type: SCD, Knee  Anti-Embolism Device Status: Refused  Anti-Embolism Device Location: BLE  Taken 04/03/2024 2000 by Curtis Dow, RN  Anti-Embolism Device Type: SCD, Knee  Anti-Embolism Device Status: Refused  Anti-Embolism Device Location: BLE  Goal: Optimal Comfort and Wellbeing  Outcome: Progressing  Goal: Readiness for Transition of Care  Outcome: Progressing  Goal: Rounds/Family Conference  Outcome: Progressing     Problem: Latex Allergy  Goal: Absence of Allergy Symptoms  Outcome: Progressing     Problem: Fall Injury Risk  Goal: Absence of Fall and Fall-Related Injury  Outcome: Progressing  Intervention: Promote Injury-Free Environment  Recent Flowsheet Documentation  Taken 04/03/2024 2000 by Curtis Dow, RN  Safety Interventions:   bed alarm   fall reduction program maintained   family at bedside   low bed   lighting adjusted for tasks/safety   nonskid shoes/slippers when out of bed   room near unit station   seizure precautions   other (comment)     Problem: Seizure, Active Management  Goal: Absence of Seizure/Seizure-Related Injury  Outcome: Progressing  Intervention: Prevent Seizure-Related Injury  Recent Flowsheet Documentation  Taken 04/03/2024 2000 by Curtis Dow, RN  Seizure Precautions:   activity supervised   clutter-free environment maintained   side rails padded     Problem: Self-Care Deficit  Goal: Improved Ability to Complete Activities of Daily Living  Outcome: Progressing     Problem: Infection  Goal: Absence of Infection Signs and Symptoms  Outcome: Progressing

## 2024-04-05 LAB — RENAL FUNCTION PANEL
ALBUMIN: 2.8 g/dL — ABNORMAL LOW (ref 3.4–5.0)
ANION GAP: 11 mmol/L (ref 5–14)
BLOOD UREA NITROGEN: 9 mg/dL (ref 9–23)
BUN / CREAT RATIO: 7
CALCIUM: 9.2 mg/dL (ref 8.7–10.4)
CHLORIDE: 110 mmol/L — ABNORMAL HIGH (ref 98–107)
CO2: 25 mmol/L (ref 20.0–31.0)
CREATININE: 1.32 mg/dL — ABNORMAL HIGH (ref 0.73–1.18)
EGFR CKD-EPI (2021) MALE: 61 mL/min/1.73m2 (ref >=60)
GLUCOSE RANDOM: 99 mg/dL (ref 70–179)
PHOSPHORUS: 3.2 mg/dL (ref 2.4–5.1)
POTASSIUM: 2.9 mmol/L — ABNORMAL LOW (ref 3.4–4.8)
SODIUM: 146 mmol/L — ABNORMAL HIGH (ref 135–145)

## 2024-04-05 LAB — MAGNESIUM: MAGNESIUM: 1.6 mg/dL (ref 1.6–2.6)

## 2024-04-05 MED ORDER — CLOBAZAM 10 MG TABLET
ORAL_TABLET | Freq: Two times a day (BID) | ORAL | 6 refills | 30.00000 days
Start: 2024-04-05 — End: 2024-11-01

## 2024-04-05 MED ORDER — LEVETIRACETAM 750 MG TABLET
ORAL_TABLET | Freq: Two times a day (BID) | ORAL | 6 refills | 30.00000 days
Start: 2024-04-05 — End: 2024-11-01

## 2024-04-05 MED ADMIN — insulin glargine (LANTUS) injection 12 Units: 12 [IU] | SUBCUTANEOUS | @ 02:00:00

## 2024-04-05 MED ADMIN — lisinopril (PRINIVIL,ZESTRIL) tablet 10 mg: 10 mg | ORAL | @ 13:00:00

## 2024-04-05 MED ADMIN — bictegrav-emtricit-tenofov ala (BIKTARVY) 50-200-25 mg tablet 1 tablet: 1 | ORAL | @ 13:00:00

## 2024-04-05 MED ADMIN — lacosamide (VIMPAT) tablet 200 mg: 200 mg | ORAL | @ 13:00:00 | Stop: 2024-04-05

## 2024-04-05 MED ADMIN — cloBAZam (ONFI) split tablet 5 mg: 5 mg | ORAL | @ 13:00:00

## 2024-04-05 MED ADMIN — levETIRAcetam (KEPPRA) tablet 1,500 mg: 1500 mg | ORAL | @ 13:00:00

## 2024-04-05 MED ADMIN — pantoprazole (Protonix) EC tablet 40 mg: 40 mg | ORAL | @ 13:00:00

## 2024-04-05 MED ADMIN — atorvastatin (LIPITOR) tablet 40 mg: 40 mg | ORAL | @ 13:00:00

## 2024-04-05 MED ADMIN — metoPROLOL succinate (Toprol-XL) 24 hr tablet 25 mg: 25 mg | ORAL | @ 13:00:00

## 2024-04-05 MED ADMIN — insulin lispro (HumaLOG) injection 0-20 Units: 0-20 [IU] | SUBCUTANEOUS | @ 03:00:00

## 2024-04-05 MED ADMIN — cloBAZam (ONFI) split tablet 5 mg: 5 mg | ORAL | @ 02:00:00

## 2024-04-05 MED ADMIN — apixaban (ELIQUIS) tablet 5 mg: 5 mg | ORAL | @ 02:00:00

## 2024-04-05 MED ADMIN — furosemide (LASIX) tablet 20 mg: 20 mg | ORAL | @ 13:00:00

## 2024-04-05 MED ADMIN — empagliflozin (JARDIANCE) tablet 25 mg: 25 mg | ORAL | @ 13:00:00

## 2024-04-05 MED ADMIN — lacosamide (VIMPAT) tablet 200 mg: 200 mg | ORAL | @ 02:00:00

## 2024-04-05 MED ADMIN — apixaban (ELIQUIS) tablet 5 mg: 5 mg | ORAL | @ 13:00:00

## 2024-04-05 MED ADMIN — colchicine (COLCRYS) tablet 0.6 mg: .6 mg | ORAL | @ 13:00:00 | Stop: 2024-04-05

## 2024-04-05 MED ADMIN — potassium chloride ER tablet 40 mEq: 40 meq | ORAL | @ 13:00:00 | Stop: 2024-04-05

## 2024-04-05 MED ADMIN — sertraline (ZOLOFT) tablet 50 mg: 50 mg | ORAL | @ 13:00:00

## 2024-04-05 MED ADMIN — insulin lispro (HumaLOG) injection 0-20 Units: 0-20 [IU] | SUBCUTANEOUS | @ 13:00:00

## 2024-04-05 MED ADMIN — levETIRAcetam (KEPPRA) tablet 1,500 mg: 1500 mg | ORAL | @ 02:00:00

## 2024-04-05 NOTE — Unmapped (Signed)
 Spiritual Care Contact  April 05, 2024 11:24 PM    Clinical Encounter Type  Type of Visit: Attempt (Patient unavailable)  Care Provided To: Patient not available  Referral Source: Nurse  On-Call Visit?: Yes  Reason for Visit: Routine spiritual support  Minutes Spent: 5 minutes     I attempted to make contact with Ryan Davenport, but the patient was unavailable for a chaplain visit.   Follow up: Will refer to unit chaplain for follow up.     Signed: Burdette Carolin, Chaplain 11:24 PM 04/05/2024

## 2024-04-05 NOTE — Unmapped (Signed)
 VSS. Patient is alert and oriented x 4, irritable. States he wants to leave AMA, however he has not been able to get a ride or find someone to help him around the house due to his unsteady gait. Worked with PT today. Declined to have finger stick for glucose check. Patient in bed, bed alarm on, call bell within reach. No needs mentioned at this time.     Problem: Adult Inpatient Plan of Care  Goal: Plan of Care Review  Outcome: Ongoing - Unchanged  Goal: Patient-Specific Goal (Individualized)  Outcome: Ongoing - Unchanged  Goal: Absence of Hospital-Acquired Illness or Injury  Outcome: Ongoing - Unchanged  Intervention: Identify and Manage Fall Risk  Recent Flowsheet Documentation  Taken 04/05/2024 0800 by Arvid Birk, RN  Safety Interventions:   bed alarm   commode/urinal/bedpan at bedside   fall reduction program maintained   lighting adjusted for tasks/safety   low bed   nonskid shoes/slippers when out of bed  Intervention: Prevent Skin Injury  Recent Flowsheet Documentation  Taken 04/05/2024 0900 by Arvid Birk, RN  Positioning for Skin: Supine/Back  Taken 04/05/2024 0800 by Arvid Birk, RN  Positioning for Skin: Right  Intervention: Prevent and Manage VTE (Venous Thromboembolism) Risk  Recent Flowsheet Documentation  Taken 04/05/2024 1600 by Arvid Birk, RN  Anti-Embolism Device Status: On  Taken 04/05/2024 1400 by Arvid Birk, RN  Anti-Embolism Device Status: On  Taken 04/05/2024 1200 by Arvid Birk, RN  Anti-Embolism Device Status: On  Taken 04/05/2024 1000 by Arvid Birk, RN  Anti-Embolism Device Status: On  Taken 04/05/2024 0800 by Arvid Birk, RN  Anti-Embolism Device Status: On  Goal: Optimal Comfort and Wellbeing  Outcome: Ongoing - Unchanged  Goal: Readiness for Transition of Care  Outcome: Ongoing - Unchanged  Goal: Rounds/Family Conference  Outcome: Ongoing - Unchanged     Problem: Latex Allergy  Goal: Absence of Allergy Symptoms  Outcome: Ongoing - Unchanged     Problem: Fall Injury Risk  Goal: Absence of Fall and Fall-Related Injury  Outcome: Ongoing - Unchanged  Intervention: Promote Injury-Free Environment  Recent Flowsheet Documentation  Taken 04/05/2024 0800 by Arvid Birk, RN  Safety Interventions:   bed alarm   commode/urinal/bedpan at bedside   fall reduction program maintained   lighting adjusted for tasks/safety   low bed   nonskid shoes/slippers when out of bed     Problem: Seizure, Active Management  Goal: Absence of Seizure/Seizure-Related Injury  Outcome: Ongoing - Unchanged     Problem: Self-Care Deficit  Goal: Improved Ability to Complete Activities of Daily Living  Outcome: Ongoing - Unchanged     Problem: Infection  Goal: Absence of Infection Signs and Symptoms  Outcome: Ongoing - Unchanged

## 2024-04-05 NOTE — Unmapped (Addendum)
 Problem: Adult Inpatient Plan of Care  Goal: Plan of Care Review  Outcome: Progressing  Note: Assumed care of Ryan Davenport at 1900.  Ryan Davenport alert and oriented X 4.  Patient's vital signs are stable.  No reports of pain.  No PRN medications given.  No neurological changes noted.  No push button events this shift.  Blood cultures were ordered STAT.  Contacted A. Braulio Calender, MD for reasoning.  She said the Senior Resident ordered so she would check into it.  Later she said there was no need for the blood cultures and she would discontinue them.  When they weren't discontinued, I discussed the matter with C. Manzanero, MD and she said to just draw the labs at 0600, not STAT.  Some of the EEG leads came off, Ryan Davenport refused to let the tech replace them.  Elana Grayer, EEG tech, ???notified the team and they said it was OK to remove all of the EEG leads.???  Tolerating diet, appetite good.  No falls, patient demonstrates an understanding of the fall risks and prevention strategies.  No signs of new skin breakdown this shift.  Encouraged to turn q 2 hrs.  All side rails up and padded, wheels locked, bed in low position, bed alarm on and call bell within reach.  No outstanding needs evident at this time.  Will continue to monitor.  Report given at 0700.  Goal: Patient-Specific Goal (Individualized)  Outcome: Progressing  Flowsheets (Taken 04/05/2024 0611)  Patient/Family-Specific Goals (Include Timeframe): Ryan Davenport will remain seizure free today.  Goal: Absence of Hospital-Acquired Illness or Injury  Outcome: Progressing  Intervention: Identify and Manage Fall Risk  Recent Flowsheet Documentation  Taken 04/04/2024 2000 by Randi Buster, RN  Safety Interventions:   aspiration precautions   bed alarm   fall reduction program maintained   low bed   nonskid shoes/slippers when out of bed   room near unit station   seizure precautions  Intervention: Prevent Skin Injury  Recent Flowsheet Documentation  Taken 04/04/2024 2000 by Randi Buster, RN  Positioning for Skin: Supine/Back  Intervention: Prevent and Manage VTE (Venous Thromboembolism) Risk  Recent Flowsheet Documentation  Taken 04/05/2024 0600 by Randi Buster, RN  Anti-Embolism Device Type: SCD, Knee  Anti-Embolism Device Status: Refused  Anti-Embolism Device Location: BLE  Taken 04/05/2024 0400 by Randi Buster, RN  Anti-Embolism Device Type: SCD, Knee  Anti-Embolism Device Status: Refused  Anti-Embolism Device Location: BLE  Taken 04/05/2024 0200 by Randi Buster, RN  Anti-Embolism Device Type: SCD, Knee  Anti-Embolism Device Status: Refused  Anti-Embolism Device Location: BLE  Taken 04/05/2024 0000 by Randi Buster, RN  Anti-Embolism Device Type: SCD, Knee  Anti-Embolism Device Status: Refused  Anti-Embolism Device Location: BLE  Taken 04/04/2024 2200 by Randi Buster, RN  Anti-Embolism Device Type: SCD, Knee  Anti-Embolism Device Status: Refused  Anti-Embolism Device Location: BLE  Taken 04/04/2024 2000 by Randi Buster, RN  Anti-Embolism Device Type: SCD, Knee  Anti-Embolism Device Status: Refused  Anti-Embolism Device Location: BLE  Goal: Optimal Comfort and Wellbeing  Outcome: Progressing  Goal: Readiness for Transition of Care  Outcome: Progressing  Goal: Rounds/Family Conference  Outcome: Progressing     Problem: Latex Allergy  Goal: Absence of Allergy Symptoms  Outcome: Progressing     Problem: Fall Injury Risk  Goal: Absence of Fall and Fall-Related Injury  Outcome: Progressing  Intervention: Promote Injury-Free Environment  Recent Flowsheet Documentation  Taken 04/04/2024 2000 by Randi Buster, RN  Safety Interventions:  aspiration precautions   bed alarm   fall reduction program maintained   low bed   nonskid shoes/slippers when out of bed   room near unit station   seizure precautions     Problem: Seizure, Active Management  Goal: Absence of Seizure/Seizure-Related Injury  Outcome: Progressing  Intervention: Prevent Seizure-Related Injury  Recent Flowsheet Documentation  Taken 04/04/2024 2000 by Randi Buster, RN  Seizure Precautions: side rails padded     Problem: Self-Care Deficit  Goal: Improved Ability to Complete Activities of Daily Living  Outcome: Progressing     Problem: Infection  Goal: Absence of Infection Signs and Symptoms  Outcome: Progressing

## 2024-04-05 NOTE — Unmapped (Signed)
 FINAL LONGTERM VIDEO EEG MONITORING REPORT    Identifying Information   NAME: Ryan Davenport    MRN: [id number]   DOB: May 27, 1961      LOC: 6126/6126-01    HISTORY: 63 y.o. male with new onset of episodes of pain and shaking of the right arm     INDICATION:  Seizures     PATIENT STATE: Awake, Drowsy, and Sleep     Study Information  EEG Start:  March 30, 2024 at 1055  EEG End:  April 05, 2024  at 0446    [place] 4/7 at 1055 to 4/7 at 2100  Zenith Lamphier 4/7/at 2100 to TBD     EEG TECHNICAL DESCRIPTION   Conditions of Recording:  Continuous EEG with simultaneous video recording was performed utilizing 21 active electrodes placed according to the international 10-20 system.  The study was recorded digitally with a bandpass of 1-70Hz  and a sampling rate of 200Hz  and was reviewed with the possibility of multiple reformatting.  The study was digitally processed with potential spike and seizure events identified for physician analysis and review.  Patient recognized events were identified by a push button marker and reviewed by the physician.   Simultaneous video was reviewed for all patient events.    EEG DESCRIPTION:       Initial Background Activity (see Daily Interpretation below for description of background changes that occur as the recording progresses)     Frequency (maximal in waking or arousal):  During waking there is 9 Hz posterior dominant alpha activity with appropriate organization and anterior-posterior voltage and frequency gradients.        PDR:  Present    Asymmetry/ Focal slowing: No    Amplitude: Normal (20 - 150 uV)    Continuity:  Continuous     Sleep Activity/ State Change: State change present with normal N2 sleep (sleep spindles and vertex waves)    Reactivity: Present     Periodic/ Rhythmic Patterns/ Epileptiform Activity:  No.    Seizures: Yes. There were frequent electroclinical seizures starting over the left parasagittal region and consisting of rhythmic beta activity evolving to slower frequency low amplitude spike discharges, duration 40 seconds.    Clinically the patient cried out and there is tonic flexion of the RUE followed by rapid clonic movements.  Initially seizures were 3-4 per hour, decreasing to about 1 per hour after 1500 on 4/7      Patient Events: No.    DAY 1:  EEG INTERPRETATION:  March 30, 2024 at  1055  to March 30, 2024 at [facility])     Relevant Medications: Keppra , Vimpat  300 mg load at 1844 on 4/7    Abnormal due to:     There were frequent electroclinical seizures starting over the left parasagittal region and consisting of rhythmic beta activity evolving to slower frequency low amplitude spike discharges, duration 40-60 seconds.  Clinically the patient cried out and there is tonic flexion of the RUE followed by rapid clonic movements.   Initially 3-4 per hour decreasing to about 1 per hour after 1500      Disconnected for transfer 1730 to 1820       DAY 2:  EEG INTERPRETATION:  March 30, 2024 at  1055  to March 30, 2024 at 2015 [facility])     Relevant Medications: Keppra , Vimpat  300 mg load at 1844 on 4/7    Abnormal due to:     There were frequent electroclinical seizures starting over the left  parasagittal region and consisting of rhythmic beta activity evolving to slower frequency low amplitude spike discharges, duration 40-60 seconds.  Clinically the patient cried out and there is tonic flexion of the RUE followed by rapid clonic movements.   Initially 3-4 per hour decreasing to about 1 per hour after 1500      Disconnected for transfer 1730 to 1820       DAY 3:  EEG INTERPRETATION:  March 30, 2024 at  2015  to March 31, 2024 at 1000 (Sharisa Toves)     Relevant Medications: Keppra , Vimpat , received 2 mg lorazepam   Abnormal due to:     Background similar to prior days, there was one seizure at 21:15 hrs that lasted few seconds.  Patient was given lorazepam  2 mg and background with no seizures after until 2 am when discharges are seen over the left parasagittally region that are persistent. These discharges continued until 6 am to then resolving and re starting at 7 am until 8:41 hrs on April 8th.  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  No more discharges are seen after 8:41 hrs and no seizures are seen after.        DAY 4:  EEG INTERPRETATION:  March 31, 2024 at  1000  to April 01, 2024 at 0800 (Auri Jahnke)     Relevant Medications: Keppra , Vimpat , Valproate     Abnormal due to:     Background similar to prior days and on April 8 at 14:55 hrs while the resident was at the bedside examining the patient. The patient had myoclonic movements on the right hand for what Valproate load was given.  EEG has muscle artifact obscuring the background but there are high amplitude discharges seen that back log with the myoclonic movement.    Seizure lasted few sec, no more seizures or event buttons were seen after       DAY 5:  EEG INTERPRETATION:  April 01, 2024 at  0800  to April 02, 2024 at 0800 (Christmas Faraci)     Relevant Medications: Keppra , Vimpat , Valproate, clobazam , clonazepam     Abnormal due to:   - mild diffuse slowing as the background is formed by alpha frequencies with admixed theta    No electrographic seizures were seen       DAY 6:  EEG INTERPRETATION:  April 02, 2024 at  0800  to April 03, 2024 at 0800 (Armando Lauman)     Relevant Medications: Keppra , Vimpat , Valproate, clobazam ,  Abnormal due to:   - Mild diffuse slowing as the background is formed by alpha frequencies     No electrographic seizures were seen         DAY 7:  EEG INTERPRETATION:  April 03, 2024 at  0800  to April 04, 2024 at 0800 (Regine Christian)     Relevant Medications: Keppra , Vimpat , Clobazam ,  Abnormal due to:   - Mild diffuse slowing as the background is formed by alpha frequencies     No electrographic seizures were seen       DAY 8:  EEG INTERPRETATION:  April 04, 2024 at  0800  to April 05, 2024 at 0446 (Kaeleigh Westendorf)     Relevant Medications: Keppra , Vimpat , Clobazam ,  Abnormal due to:   - Mild diffuse slowing as the background is formed by alpha frequencies     No electrographic seizures were seen  CLINICAL CORRELATION/ SUMMARY     These findings are consistent with frequent focal electroclinical seizures arising from the left parasagittal region with clinical correlate of right arm flexion and clonic activity that improved in frequency. Last seizure was seen on 14:55 hrs on April 8 characterized by myoclonic movements of few seconds.      Interpreting Attending: Benita Bramble, MD         [id number] Seizure Risk Score  Clinical risk score based on EEG findings and clinical history of seizures to aid in determination of optimal duration of EEG monitoring for detection of electrographic seizures.  Score has not been validated in patients under age 51 or following cardiac arrest.    NOT APPLICABLE FOR THIS PATIENT     Add 1 point if known history of epilepsy or prior clinical seizure.      Risk Group     Seizure Risk   at 72 hours   Duration of Monitoring    Seizure risk < 5%     Duration of Monitoring    Seizure risk < 2%       Low Risk,   Score = 0     3.1%   1 Hour   3.3 Hours     Medium Risk,   Score = 1     12%   12 Hours   29 Hours     High Risk,   Score = 2 or greater                >25%   >24 Hours   >30 Hours   Struck et al JAMA Neurology, January 2020    [id number] Seizure Risk Score is not appropriate when EEG monitoring is being performed for the following indications:  Treatment of status epilepticus, seizures already documented on EEG or Ictal Interictal continuum pattern (IIC)  Monitoring sedation/ burst suppression for management of intracranial pressure and/or paralyzed patients  Diagnostic evaluation of transient episodes concerning for possible seizures (spell capture)   Patients s/p cardiac arrest undergoing targeted temperature management      Cherrie Cornwall al. American Clinical Neurophysiology Society's Standardized Critical Care EEG Terminology: 2021 Version. Journal of Clinical Neurophysiology 38(1):p 1-29, January 2021.   ACNS STANDARDIZED ICU EEG NOMENCLATURE v ([email or web address])

## 2024-04-05 NOTE — Unmapped (Signed)
 Neurology Inpatient Team B (NMB)  Daily Progress Note       Patient: Ryan Davenport  Code Status: Full Code  Level of Care: Acute floor status.   LOS: 6 days      Overnight Events & Subjective:     - Upon entering room patient states he is ready to go and that he is going to church today. Later tells nursing he is leaving at 5 no matter what. However, when attempting to get patient out of bed, he is extremely unsteady on his feet. OT discussed with patient that he would not be able to safely return to home by himself and patient understanding.  - Notes improved diarrhea - 1-2 episodes daily now       Physical Exam:   Physical Exam:  General Appearance: in no acute distress, well appearing, and alert,. Normal skin color, afebrile.  HEENT: Head is atraumatic and normocephalic. Sclera anicteric without injection. Oropharyngeal membranes are moist with no erythema or exudate.  Neck: Grossly normal range of motion.  Lungs: Normal work of breathing.  Heart: warm, well perfused.  Abdomen: Soft, nontender, nondistended.  Extremities: No clubbing, cyanosis, or edema. Mild tenderness to right hallux and ankle to palpation.     NEUROLOGICAL EXAMINATION:      General/Mental Status:    Alert, conversant, able to follow conversation and interview.     Comprehension was intact.   Attention span and concentration were mildly diminished.    Spontaneous speech was with mild dysarthria.       Cranial Nerves:   II, III - Pupils are equal and reactive to light b/l (direct and consensual reactions). No visual field defects.   III, IV, VI - Pursuit eye movements were uninterrupted with full range and without more than end-gaze nystagmus. Extra ocular movements are intact, no ptosis, no diplopia, no nystagmus.  V - Sensation of the face intact b/l to light touch in all three divisions of CNV.   VII - Face symmetrical at rest, no facial droop. Normal facial movements bilaterally including forehead, eye closure, and smile/grimace.  VIII - Hearing grossly intact to conversation.   IX and X - Palate movement is symmetric, no dysarthria.  XI - Full shoulder shrug bilaterally; normal tone and strength of sternocleidomastoid muscles bilaterally.  XII - No tongue atrophy, no tongue fasciculations; tongue protrudes midline, full range of movements of the tongue.     Motor:   Normal bulk. No tremors, myoclonus, or other adventitious movement.  Pronator drift is absent. Fasciculations not observed.      Muscle strength:  LUE 5/5  RUE 1/5 at deltoid, bicep, tricep. 2/5 at [facility], ECR, and hand grip.  BLE 5/5.     Reflexes: Deferred     Sensory:           UEs LEs     R L R L   Light touch WNL WNL WNL WNL      Cerebellar/Coordination/Gait:  Deferred       Assessment/Plan:       Assessment: Ryan Davenport is a 63 y.o. 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     ACTIVE PROBLEMS:  #Left parasagittal focal seizures  Initial presentation with 1 week of right upper extremity and facial tonic followed by clonic activity lasting less than 1 minute,  though increasing in frequency up to 15 events per day. With EEG showing left parasagittal focal seizures, constitutes left focal seizures correlating with aforementioned semiology, though etiology remains unclear. MRI brain with contrast does not show acute intracranial abnormality or discrete epileptogenic focus to explain the seizures. Patient continued to have frequent seizures, so Vimpat  was started 4/7 PM, Depakote  started 4/8 AM, Onfi  and Klonopin  started 4/8 PM. Last seizure noted 4/8 PM. With decreased seizure activity for 2 days, discontinued Klonopin  4/10, Depakote  on 4/11. Discontinuing Vimpat  today and continuing EEG monitoring. No seizure noted since 4/8 PM.     With patient's history of HIV (previously AIDS, though CD4 count recovered to 694/7), and history of classically immunocompromised infections (CMV retinitis, molluscum contagiosum, secondary syphilis), could raise suspicion for new CNS infectious etiology. However, MRI brain with and without contrast not showing any focal lesions or bleeds to explain the current symptoms and no systemic signs to explain meningitis or infectious encephalitis. CSF showed elevated cell counts, predominantly lymphocytes raising suspicion for viral meningitis. ID was consulted for further infectious workup given history of HIV. CSF viral PCR negative. They feel infection is less likely or resolved at this time and do not recommend additional treatment.     ASM regimen: (avoiding DDI with Biktarvy )  - Keppra  1.5 g twice daily  - Onfi  5 mg twice daily  - Discontinued: Klonipin (on 4/10), Depakote  (on 4/11)    Workup:   -- MRI brain wo 03/30/24: Unremarkable  -- MRI brain w/ 04/01/24 (epilepsy protocol): Unremarkable  --EEG (4/7-): Frequent focal electroclinical seizures arising from the left parasagittal region with clinical correlate of right arm flexion and clonic activity, last seizure 4/814: 55  -- Serum labs: WBC 6.2, CD4 count 690, HIV RNA 1369,  - CSF labs: 13 nucleated cells, less than 2 RBC, protein 42, glucose 94, culture no growth to date, CMV negative, EBV negative, enterovirus negative, HHV-6 negative, HSV 1/2 negative, VZV negative, crypto antigen negative, cytology with no malignant cells and mostly small lymphocytes without viral cytopathic effect, heme path with no blasts or abnormal lymphoid cells or monotypic B/T cells  -- Stool: GI pathogen panel negative  -- UA noninfectious    # Hypokalemia  Potassium 2.9, Phosphorus 2.6 on on 4/12. Hypokalemia most likely due to ongoing and acutely increased diarrhea.   - Ordered potassium repletion 40mEq IV, will recheck potassium 4/12 PM.     #Diarrhea  Patient has had increased urgency and frequency of watery diarrhea since admission. He has had watery diarrhea at home, but less frequently and no urgency. No blood notes in stool at home or inpatient. He states that taking large amounts of medications orally with no food is making his diarrhea worse. GIPP negative. GI is consulted for further recommendations.  - GI consulted; following for recs  - Follow up stool O&P  - Imodium  prn     #Gout  Patient has hx of gout, on home colchicine  0.6 mg tablet daily prn.  On 4/11, patient has pain on right hallux and ankle. On physical exam, moderately tender to palpation, no erythema or swelling. Colchicine  0.6 mg daily until gout resolves.  - Colchicine  0.6 mg daily until gout resolves    Pending Labs       Order Current Status    Blood Culture #1 In process    Blood Culture #2 In process    Comprehensive Ova and Parasite Exam In process    Encephalopathy, Autoimmune/Paraneoplastic Eval, CSF In process  Encephalopathy, Autoimmune/Paraneoplastic Eval, Serum In process    CSF Culture Preliminary result            STABLE PROBLEMS:  #Hx DVT/PE  Chart history of DVT PE 2012, on long-term anticoagulation as per hematology.  Patient states last took Eliquis 03/29/24 afternoon.  - Holding home Eliquis given LP as above     #T2DM  History of poorly controlled type 2 diabetes with A1c 12 (increased from 6.4% 3 months prior).  Initial presentation with hyperglycemia to outside hospital, 4/5 to 500s, subsequently improved.  On 12 units Lantus nightly, recently restarted 4/1, and 5 units lispro 3 times daily AC.  Had insurance issues filling his insulin prescription recently which explains the poor blood sugar control.  Plan to restart Lantus as previously with aggressive sliding scale and up titration of mealtime pending sliding requirements.  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 HIV RNA was detected, but absolute count is 690. Medical history complicated by multiple infections including right CMV retinitis, molluscum contagiosum, and secondary syphilis, all status post treatment. On Biktarvy at home.   - Continue Biktarvy daily    #Depression  -Continue home sertraline 50 mg daily     #CAD s/p stent 2021 #HLD  History of CAD ISO HLD s/p stent 2021, though not previously on aspirin at home.  Per family, not taking an aspirin.  -Consider inpatient vs outpatient cardiology follow up to determine if needing ASA  -Restart home atorvastatin 40 mg daily, Metoprolol 25 mg qd     #HTN  - Lisinopril 10 mg every day, Lasix 20 qd     #GERD  -pharmacy equivalent pantoprazole 40 mg daily (home omeprazole 40 mg daily)     Discharge Planning:   - Case management: N/A  - Social work: N/A  - PT: 5xL  - OT: 5xH  - SLP: N/A  - Expected Discharge Disposition: 4/14    Checklist:  - Diet: Regular diet  - IV fluids: no  - Bowel Regimen: No indication for a bowel regimen at this time (reason: PRN)  - GI PPX: No GI indications  - DVT PPX:  Holding given consideration for LP  - Lines/Access: PIV x1.    - Foley: No     This patient was seen and discussed with Dr. Parker Bollard, who agrees with the above assessment and plan.       Please page Neurology Team B at [phone] for any questions/concerns.      Marlinda Simon, MD PGY1       Data Review:     Contact Information:  Family contact: Wayland Baik (Mother, [phone])  PCP: Consuelo Denmark, PA    Medications:  Scheduled medications:    apixaban  5 mg Oral BID    atorvastatin  40 mg Oral Daily    bictegrav-emtricit-tenofov ala  1 tablet Oral Daily    cloBAZam  5 mg Oral BID    empagliflozin  25 mg Oral Daily    furosemide  20 mg Oral Daily    insulin glargine  12 Units Subcutaneous Nightly    insulin lispro  0-20 Units Subcutaneous ACHS    lacosamide  200 mg Oral BID    levETIRAcetam  1,500 mg Oral BID    lisinopril  10 mg Oral Daily    metoPROLOL succinate  25 mg  Oral Daily    pantoprazole   40 mg Oral Daily before breakfast    sertraline   50 mg Oral Daily     Continuous infusions:   PRN medications: acetaminophen , calcium carbonate, cyclobenzaprine , dextrose in water, diclofenac sodium, glucagon, glucose, hydrOXYzine , lidocaine , loperamide , methocarbamol , polyethylene glycol, senna    24 hour vital signs:  Temp:  [36.6 ??C (97.9 ??F)-37.1 ??C (98.8 ??F)] 36.6 ??C (97.9 ??F)  Heart Rate:  [107-110] 110  Resp:  [16-22] 22  BP: (117-151)/(64-91) 149/91  MAP (mmHg):  [82-106] 106  SpO2:  [97 %-98 %] 98 %  BMI (Calculated):  [32.79] 32.79    Ins and Outs:  No intake/output data recorded.    Laboratory values:  All Labs Last 24hrs:   Recent Results (from the past 24 hours)   Potassium Level    Collection Time: 04/04/24  4:03 PM   Result Value Ref Range    Potassium 3.2 (L) 3.4 - 4.8 mmol/L   POCT Glucose    Collection Time: 04/04/24  4:40 PM   Result Value Ref Range    Glucose, POC 113 70 - 179 mg/dL   POCT Glucose    Collection Time: 04/04/24 10:23 PM   Result Value Ref Range    Glucose, POC 202 (H) 70 - 179 mg/dL   Renal Function Panel    Collection Time: 04/05/24  6:13 AM   Result Value Ref Range    Sodium 146 (H) 135 - 145 mmol/L    Potassium 2.9 (L) 3.4 - 4.8 mmol/L    Chloride 110 (H) 98 - 107 mmol/L    CO2 25.0 20.0 - 31.0 mmol/L    Anion Gap 11 5 - 14 mmol/L    BUN 9 9 - 23 mg/dL    Creatinine 5.62 (H) 0.73 - 1.18 mg/dL    BUN/Creatinine Ratio 7     eGFR CKD-EPI (2021) Male 61 >=60 mL/min/1.6m2    Glucose 99 70 - 179 mg/dL    Calcium 9.2 8.7 - 13.0 mg/dL    Phosphorus 3.2 2.4 - 5.1 mg/dL    Albumin 2.8 (L) 3.4 - 5.0 g/dL   Magnesium  Level    Collection Time: 04/05/24  6:13 AM   Result Value Ref Range    Magnesium  1.6 1.6 - 2.6 mg/dL   POCT Glucose    Collection Time: 04/05/24  8:57 AM   Result Value Ref Range    Glucose, POC 170 70 - 179 mg/dL   POCT Glucose    Collection Time: 04/05/24 11:57 AM   Result Value Ref Range    Glucose, POC 146 70 - 179 mg/dL     Imaging:  Pertinent imaging discussed in the A/P section

## 2024-04-06 MED ORDER — CLOBAZAM 10 MG TABLET
ORAL_TABLET | Freq: Two times a day (BID) | ORAL | 5 refills | 30.00000 days | Status: CP
Start: 2024-04-06 — End: 2024-10-03
  Filled 2024-04-06: qty 30, 30d supply, fill #0

## 2024-04-06 MED ORDER — LEVETIRACETAM 750 MG TABLET
ORAL_TABLET | Freq: Two times a day (BID) | ORAL | 5 refills | 30.00000 days | Status: CP
Start: 2024-04-06 — End: 2024-10-03
  Filled 2024-04-06: qty 120, 30d supply, fill #0

## 2024-04-06 MED ADMIN — pantoprazole (Protonix) EC tablet 40 mg: 40 mg | ORAL | @ 13:00:00 | Stop: 2024-04-06

## 2024-04-06 MED ADMIN — cloBAZam (ONFI) split tablet 5 mg: 5 mg | ORAL | @ 13:00:00 | Stop: 2024-04-06

## 2024-04-06 MED ADMIN — insulin lispro (HumaLOG) injection 0-20 Units: 0-20 [IU] | SUBCUTANEOUS | @ 16:00:00 | Stop: 2024-04-06

## 2024-04-06 MED ADMIN — levETIRAcetam (KEPPRA) tablet 1,500 mg: 1500 mg | ORAL | @ 13:00:00 | Stop: 2024-04-06

## 2024-04-06 MED ADMIN — cloBAZam (ONFI) split tablet 5 mg: 5 mg | ORAL | @ 01:00:00

## 2024-04-06 MED ADMIN — empagliflozin (JARDIANCE) tablet 25 mg: 25 mg | ORAL | @ 13:00:00 | Stop: 2024-04-06

## 2024-04-06 MED ADMIN — bictegrav-emtricit-tenofov ala (BIKTARVY) 50-200-25 mg tablet 1 tablet: 1 | ORAL | @ 13:00:00 | Stop: 2024-04-06

## 2024-04-06 MED ADMIN — furosemide (LASIX) tablet 20 mg: 20 mg | ORAL | @ 13:00:00 | Stop: 2024-04-06

## 2024-04-06 MED ADMIN — sertraline (ZOLOFT) tablet 50 mg: 50 mg | ORAL | @ 13:00:00 | Stop: 2024-04-06

## 2024-04-06 MED ADMIN — apixaban (ELIQUIS) tablet 5 mg: 5 mg | ORAL | @ 01:00:00

## 2024-04-06 MED ADMIN — apixaban (ELIQUIS) tablet 5 mg: 5 mg | ORAL | @ 13:00:00 | Stop: 2024-04-06

## 2024-04-06 MED ADMIN — lisinopril (PRINIVIL,ZESTRIL) tablet 10 mg: 10 mg | ORAL | @ 13:00:00 | Stop: 2024-04-06

## 2024-04-06 MED ADMIN — atorvastatin (LIPITOR) tablet 40 mg: 40 mg | ORAL | @ 13:00:00 | Stop: 2024-04-06

## 2024-04-06 MED ADMIN — levETIRAcetam (KEPPRA) tablet 1,500 mg: 1500 mg | ORAL | @ 01:00:00

## 2024-04-06 MED ADMIN — metoPROLOL succinate (Toprol-XL) 24 hr tablet 25 mg: 25 mg | ORAL | @ 13:00:00 | Stop: 2024-04-06

## 2024-04-06 NOTE — Unmapped (Signed)
 Spiritual Care Visit Note    Assessment Summary: Chaplain responded to Consult Order request. Chaplain established a relationship of care and support with the patient. The patient talked for a short period and advised he had been trying to rest before going home. Chaplain advised a follow-up visit will be made and patient agreed. Chaplain talked with a Staff person and patient is not yet scheduled for discharge.       Clinical Encounter Type  Type of Visit: Initial visit  Care Provided To: Patient  Referral Source: Nurse  On-Call Visit?: Yes  Reason for Visit: Routine spiritual support  Minutes Spent: 10 minutes       Spiritual Assessment  Faith: Christian  Presenting Concern(s): None Identified  Community: Not assessed    Spiritual Care Interventions  Interventions Made: Established relationship of care and support, Reflective listening, Non-anxious presence  Outcomes: Appreciated Chaplain visit, Open to continued care      Spiritual Care Plan  Spiritual Care Plan: Chaplain will continue to follow.       SignedRito Chess, Chaplain 12:06 PM 04/06/2024

## 2024-04-06 NOTE — Unmapped (Signed)
 [facility] Consult Service   Treatment Plan         Assessment and Recommendations:   Ryan Davenport is a 63 y.o. male with a PMHx of HIV on biktarvy  (CD4 690), DVT/PE on [facility], h/o multiple opportunistic infections who presented to [facility] with new seizures. The patient is seen in consultation at the request of Romayne Clubs, MD (Neurology ([facility])) for  diarrhea.    Acute diarrhea  35M h/o HIV w/ CD4 620, p/w seizure then developed diarrhea while inpatient. GIPP negative, Cdiff negative. Patient says that diarrhea is happening because he is taking medicines on empty stomach. Denies having diarrhea before presentation because he eats food with his meds. Given h/o opportunistic infections in the past, would consider infectious diarrhea as potential etiology. Infectious testing thus far is negative, would consider O&P to exclude further organisms not on GIPP. Less concern for CMV colitis or other viral colitides (benign abdominal exam). If truly non-infectious then may consider medication related vs diet related.   - oral rehydration, electrolyte monitoring  - consider stool O&P, would discuss with ID  - would consider colonoscopy if ID team feels prudent to rule-out source of diarrhea; pt refusing inpatient, requesting follow up outpatient.       Outpatient follow up requested to evaluate diarrhea and consider colonoscopy.     Issues Impacting Complexity of Management:  -None    Recommendations discussed with the patient's primary team. We will sign-off at this time, please re-contact if additional questions or a new need for consultation arises.

## 2024-04-06 NOTE — Unmapped (Signed)
 Neurology Team B  Treatment Plan Note    Patient has durable medical equipment needs based on assessment by medical team.    A standard walker ([id number], [id number], [phone]) and related accessories are covered if all of the following criteria (1-3) are met: 1. The beneficiary has a mobility limitation that significantly impairs his/her ability to participate in one or more mobility-related activities of daily living (MRADL) in the home. A mobility limitation is one that: a. Prevents the beneficiary from accomplishing the MRADL entirely, or b. Places the beneficiary at reasonably determined heightened risk of morbidity or mortality secondary to the attempts to perform the MRADL, or c. Prevents the beneficiary from completing the MRADL within a reasonable time frame; and 2. The beneficiary is able to safely use the walker; and 3. The functional mobility deficit can be sufficiently resolved with use of a walker. Please advise when documentation has been added. Commode: Patient is either confined to a single room and/or level of the home w/ no access to a bathroom on that level.     Brady Cagey MD  Resident Physician PGY3  [facility] Department of Neurology

## 2024-04-06 NOTE — Unmapped (Signed)
 Problem: Adult Inpatient Plan of Care  Goal: Plan of Care Review  Outcome: Progressing  Note: Assumed care of pt at 1900.  Pt alert and oriented X 4.  Patient's vital signs are stable.  No reports of pain.  No PRN medications given.  No neurological changes noted.  Pt upset he was not discharged today.  No push button events this shift.  Tolerating diet, appetite fair.  No falls, patient demonstrates an understanding of the fall risks and prevention strategies.  No signs of new skin breakdown this shift.  Encouraged to turn q 2 hrs.  All side rails up and padded, wheels locked, bed in low position, bed alarm on and call bell within reach.  No outstanding needs evident at this time.  Will continue to monitor.  Report given at 0700.  Goal: Patient-Specific Goal (Individualized)  Outcome: Progressing  Flowsheets (Taken 04/06/2024 0619)  Patient/Family-Specific Goals (Include Timeframe): Pt will fully participate with PT/OT today.  Goal: Absence of Hospital-Acquired Illness or Injury  Outcome: Progressing  Intervention: Identify and Manage Fall Risk  Recent Flowsheet Documentation  Taken 04/05/2024 2000 by Randi Buster, RN  Safety Interventions:   aspiration precautions   bed alarm   fall reduction program maintained   low bed   nonskid shoes/slippers when out of bed   room near unit station  Intervention: Prevent Skin Injury  Recent Flowsheet Documentation  Taken 04/05/2024 2000 by Randi Buster, RN  Positioning for Skin: Supine/Back  Goal: Optimal Comfort and Wellbeing  Outcome: Progressing  Goal: Readiness for Transition of Care  Outcome: Progressing  Goal: Rounds/Family Conference  Outcome: Progressing     Problem: Latex Allergy  Goal: Absence of Allergy Symptoms  Outcome: Progressing     Problem: Fall Injury Risk  Goal: Absence of Fall and Fall-Related Injury  Outcome: Progressing  Intervention: Promote Injury-Free Environment  Recent Flowsheet Documentation  Taken 04/05/2024 2000 by Randi Buster, RN  Safety Interventions:   aspiration precautions   bed alarm   fall reduction program maintained   low bed   nonskid shoes/slippers when out of bed   room near unit station     Problem: Seizure, Active Management  Goal: Absence of Seizure/Seizure-Related Injury  Outcome: Progressing     Problem: Self-Care Deficit  Goal: Improved Ability to Complete Activities of Daily Living  Outcome: Progressing     Problem: Infection  Goal: Absence of Infection Signs and Symptoms  Outcome: Progressing

## 2024-04-07 DIAGNOSIS — Z09 Encounter for follow-up examination after completed treatment for conditions other than malignant neoplasm: Principal | ICD-10-CM

## 2024-04-09 NOTE — Unmapped (Signed)
 Routine follow up:     Please schedule patient for follow as follows:    Resident Clinic  Template: return neurology  Time: at least 1 hour   Provider: [facility] or [place].  If unavailable, Enrique Harvest or [place].  Diagnosis: Seizure  Time range:  between 3-4 months. Please do not schedule sooner. If none of these dates are available, please reply to message sender for further instructions.  Ok for recall list.  Preferred day of week: Thursday afternoon that is staffed by Dr. Humberto Magnus or Dr. Arlyn Lambing.  Ok for other days of the week in no Thursdays available.   Please ensure scheduled PRIOR to appointment: n/a    Please reply to message sender with an epic inbox message to confirm the appointment has been scheduled.     Brady Cagey MD  Resident Physician PGY3  [facility] Department of Neurology

## 2024-04-14 NOTE — Unmapped (Signed)
 Reason for Visit: Hospital Follow-up Medication Management    Assessment and Plan:     # Abdominal pain, diarrhea: Patient reports abdominal pain has resolved but still experiencing frequent diarrhea, especially at night. He is not taking an anti-diarrheal agent such as loperamide  or Lomotil. Patient reports taking docusate and Miralax for diarrhea, provided education that these agents are likely to exacerbate diarrhea and should be used for constipation. Of note, patient has been taking colchicine  0.6 mg frequently (difficult to ascertain if taking daily vs. Several times weekly). Uric acid (12/31/2023) of 9.3, goal <6.0. Recommend starting allopurinol daily and use colchicine  PRN gout flare. Will check BMP for Scr and renal dosing of allopurinol.   Stop Colace, Miralax   Start Loperamide  2 mg PRN   Stop colchicine  0.6 mg unless during acute gout flare  Repeat BMP, then start allopurinol daily (adjusted per renal function)      # T2DM: uncontrolled per last A1c of 12.0%, goal <7.0% per ADA guidelines. Patient reports taking Lantus  11 units nightly and Novolog  5 units TIDAC. He is checking BG TIDAC but difficult in specifying FBG values vs post prandial BG. Has held Rybelsus  14 mg as directed. If restarting, recommnend restarting titration at Rybelsus  3 mg --> 7 mg --> 14 mg to minimize potential side effects. Alternatively, consider Trulicity or Mounjaro subcutaneous weekly injection. Patient would benefit from GLP1ra therapy for ASVCD risk. He is taking Jardiance  25 mg daily. Previously used [employer] 14 days (2021) but had difficulty with adhesive. Would recommend trying Dexcom G7 or utilizing Tegaderm or other overpatch to help with sensor adhering to skin. Difficult to adjust insulin  without specific SMBG readings, but per patient report, all readings (FBG and PPBG) are <200 mg/dL since discharge.   Continue Lantus  11 units nightly and Novolog  5 units TIDAC  Continue Jardiance  25 mg daily  UACR due now- elevated  Foot exam due now;   A1c due July 2025  Recommend Dexcom G7 CGM - discuss with PCP at follow up visit  Future consideration: insulin  titration, Trulicity/Mounjaro as tolerated    # Medication Management   Reviewed risk of rash with Keppra , sedation with Onfi    Patient states has not taken Dupixent  in several months due to issue with insurance/delivery; later shares that he had a reaction to the injection; per EMR in Sept 2024 had painful injection, attributed to injection technique; if restarting, recommend loading dose given duration since last injection  Reviewed med list and updated as needed; patient shared different history with physician during visit; assess for cognition with PCP; fill history indicates inconsistent adherence; no reported difficulty with med access  Encouraged use of medication pill box and reviewed appropriate use. Reviewed the indication, dose, and frequency of each medication with patient.       Recommendations and medication-related problems were discussed directly with the patient's transitions of care physician, Dr. Reva Castleman, immediately following the pharmacist visit prior to the physician visit. Questions/concerns were addressed to the patient's satisfaction. I spent a total of 35 minutes face to face with the patient delivering clinical care and providing education/counseling.      Ryan Davenport, PharmD CPP  Clinical Pharmacist    ___________________________________________________     History of Present Illness:  Ryan Davenport is a 63 y.o. 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 Pt also in ED 4/5, 3/31 for hyperglycemia and ED at OSH on 4/3 for hyperglycemia. Pt presents to the [name] for follow-up without all of his medication bottles.     At discharge,   Start:   levetiracetam 750 mg 2 tab (1500 mg) BID   clobazam 10 mg 0.5 tab (5 mg) BID     Hold Rybelsus 14 mg daily (abdominal symptoms)    At ED visit 4/5, prescribed gabapentin 100 mg TID     At ED visit 4/3, started Novolog 5 units TIDAC (and Lantus 10 units)    At ED visit 3/31, started Lantus 12 units nightly    Follow-up Issues:   Outpatient follow up:  [ ]  Neurology:  seizures, aseptic meningitis, follow up pending labs (AI/PNP panel)  [ ]  Gastroenterology:  diarrhea, colonoscopy  [ ]  Cardiology:  discuss need for antiplatelet therapy for cardiac stent  [ ]  PCP:  hyperglycemia and issue refilling insulin      Since discharge, patient notes swelling on R foot that comes and goes but is not painful. He has not checked BG today. Checking BG TIDAC but difficult for patient to clarify FBG vs PPBG. However, he notes that all readings have been under 200 mg/dL. He previously had a CGM ([employer] 14 day) but had difficulty with with adhesive. He reports watery diarrhea, especially overnight.       Discrepancies noted in medication review:   OTC: acetaminophen 500 mg 2 tabs as needed; Colace + Miralax (thought treated diarrhea); taking whole tab of metoprolol succinate 25 mg daily (prescribed 0.5 tab, 12.5 mg); has not started gabapentin; taking omeprazole once daily (prescribed BID)     Medication Adherence and Access:  Missed doses?: no  Uses pillbox?: yes  Anyone else assist with medication organization? no  Current insurance coverage: [employer] + Medicaid MQB   Preferred Pharmacy: [facility] in [place]    Medications affordable?: yes - everything covered at no cost; Radio producer via grant  Needs refills? yes steroid creams     Allergies:   Allergies   Allergen Reactions    Latex Rash    Shellfish Containing Products      Other reaction(s): SHORTNESS OF BREATH Metformin Nausea Only     Both IR and XR formulations    Both IR and XR formulations   Both IR and XR formulations    Both IR and XR formulations      Both IR and XR formulations Both IR and XR formulations    Penicillins Rash       Medications: Medications reviewed in EPIC medication station and updated today by the clinical pharmacist practitioner.  Current Outpatient Medications on File Prior to Visit   Medication Sig Dispense    atorvastatin (LIPITOR) 40 MG tablet Take 1 tablet (40 mg total) by mouth daily. AM    betamethasone dipropionate 0.05 % cream Apply 1 Application topically two (2) times a day. Needs refill    BIKTARVY 50-200-25 mg tablet Take 1 tablet by mouth daily. AM, via [facility]     blood sugar diagnostic (GLUCOSE BLOOD) Strp Use to check blood sugar as directed with insulin 3 times a day & for symptoms of high or low blood sugar. supplies    blood-glucose meter kit Use as instructed supplies    cetirizine (ZYRTEC) 10 MG tablet  Take 1 tablet (10 mg total) by mouth two (2) times a day. PRN- not taking    cloBAZam (ONFI) 10 mg Tab Take 0.5 (one-half) tablet (5 mg total) by mouth two (2) times a day. 1/2 tab BID     colchicine (COLCRYS) 0.6 mg tablet TAKE 1 TABLET BY MOUTH DAILY AS NEEDED FOR GOUT AM, daily vs 3 times weekly    dupilumab (DUPIXENT SYRINGE) 300 mg/2 mL Syrg injection Inject 2 mL (300 mg total) under the skin every fourteen (14) days. For maintenance dose Need order -- 2 months without (delivery/ins issue)   Would like to use [facility]    ELIQUIS 5 mg Tab Take 1 tablet (5 mg total) by mouth two (2) times a day. BID     empagliflozin (JARDIANCE) 25 mg tablet Take 1 tablet (25 mg total) by mouth daily. AM    furosemide (LASIX) 20 MG tablet Take 1 tablet (20 mg total) by mouth daily as needed. Taking AM- does not decr swelling    gabapentin (NEURONTIN) 100 MG capsule Take 1 capsule (100 mg total) by mouth Three (3) times a day. Not started  yet     hydrocortisone 2.5 % ointment Apply 1 Application topically two (2) times a day. To active areas of the face until smooth PRN    hydrOXYzine (ATARAX) 25 MG tablet Take 1 tablet (25 mg total) by mouth every six (6) hours as needed. 1 TID - itching     insulin aspart (NOVOLOG FLEXPEN) 100 unit/mL (3 mL) injection pen Inject 0.05 mL (5 Units total) under the skin Three (3) times a day before meals. 5 units with meal    insulin glargine (BASAGLAR, LANTUS) 100 unit/mL (3 mL) injection pen Inject 0.12 mL (12 Units total) under the skin nightly. 11 units with meal    lancets Misc Use to check blood sugar as directed with insulin 3 times a day & for symptoms of high or low blood sugar. supplies    levETIRAcetam (KEPPRA) 750 MG tablet Take 2 tablets (1,500 mg total) by mouth two (2) times a day. 2 tab BID     lisinopril (PRINIVIL,ZESTRIL) 10 MG tablet Take 1 tablet (10 mg total) by mouth daily. AM     methocarbamoL (ROBAXIN) 750 MG tablet 750mg  4x daily prn muscle spasms 1 AM - 2x last week    metoPROLOL succinate (TOPROL-XL) 25 MG 24 hr tablet Take 0.5 tablets (12.5 mg total) by mouth daily. Takes whole tab    omeprazole (PRILOSEC) 40 MG capsule 40mg  BID Not taken today - once a day     pen needle, diabetic 32 gauge x 5/32 (4 mm) Ndle Use with insulin up to 4 times/day as needed. Supplies     [Paused] semaglutide (RYBELSUS) 14 mg Tab Take 14 mg by mouth daily. Hold     sertraline (ZOLOFT) 50 MG tablet Take 1 tablet (50 mg total) by mouth daily. Daily     triamcinolone (KENALOG) 0.1 % ointment Apply topically two (2) times a day. To active areas of trunk and extremities until smooth Needs refill    [DISCONTINUED] citalopram (CELEXA) 40 MG tablet Take 1 tablet (40 mg total) by mouth daily. remove     No current facility-administered medications on file prior to visit.       Future Appointments   Date Time Provider Department Center   04/15/2024  1:00 PM [facility] HOSPITAL FOLLOW-UP [facility]   06/03/2024  1:00 PM Wright, Kaylor E, MD [facility]   06/03/2024  1:55 PM Consuelo Denmark, PA [facility]   06/11/2024  2:30 PM Lugo-Somolinos, Elna Haggis, MD [facility]   06/12/2024  3:00 PM Pink Bridges, MD [facility]

## 2024-04-14 NOTE — Unmapped (Unsigned)
 Internal Medicine Hospital Follow-Up Visit    ASSESSMENT / PLAN:  1. Hospital discharge follow-up    2. Chronic eczema    3. Chronic cardiopulmonary disease    4. Chronic diastolic heart failure    5. Atherosclerosis of coronary artery, unspecified vessel or lesion type, unspecified whether angina present, unspecified whether native or transplanted heart    6. Essential hypertension    7. 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 infarction    8. Pulmonary embolism, unspecified chronicity, unspecified pulmonary embolism type, unspecified whether acute cor pulmonale present    9. Hereditary and idiopathic peripheral neuropathy    10. Molluscum contagiosum    11. Gastroesophageal reflux disease, unspecified whether esophagitis present    12. AIDS    13. Depression, unspecified depression type    14. Gout, unspecified cause, unspecified chronicity, unspecified site    15. Human immunodeficiency virus (HIV) disease    16. Obesity (BMI 30.0-34.9)    17. Type 2 diabetes mellitus with diabetic neuropathy, with long-term current use of insulin     18. Focal motor seizure    19. Diarrhea, unspecified type        Ryan Davenport is a 63 y.o. male with a history of HIV on Biktarvy , DVT on AC, multiple prior infections (CMV right retinitis, molluscum contagium, secondary syphillis, septic arthritis), and T2DM who presented to [facility] on 03/30/2024 for new onset focal seizures thought to be secondary to viral meningitis based on CSF studies although no virus was identified.  Presents today for a hospital follow up.     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  EEG showed focal seizures with 5 ASM initially required for control.  MRI brain with contrast unremarkable.  CSF raised suspicion for viral meningo-encephalitis as cause of acute symptomatic seizures - although no virus identified.   Patient should continue on ASM regimen below until neurology follow up when Keppra  should be weaned first. CSF cytology w/o malignant cells ANA/PNP not revealing. Hematopath w/o malignancy. ANA negative. Pt drove to appointment.  - Keppra  1.5 g twice daily  -- Onfi  5 mg twice daily     Diarrhea  Patient w diarrhea inpatient 2/2 taking meds on empty stomach. GIPP negative.  O&P resulted post discharge - negative.   -Pt reported taking stool softener and occasional miralax, advised to discontinue  -Could start immodium if needed  -Recommended stopping daily colchicine  as this could potentiate diarrhea     T2DM  History of poorly controlled type 2 diabetes with A1c 12 (increased from 6.4% 3 months prior).   [facility], 4/5 BG 500s; patient states this is due to recent medication non-compliance from difficulty with insurance approval of his insulin , which has been resolved.  Pt reports BG at home < 200 per patient time of day is not clear.  -12 units Lantus  - pt reports taking 11 units  - 5 units lispro AC  - Jardiance  25 qd    Intermittent R Ankle Swelling (on exam Bilateral L>R)  Hx Gout in elbow per notes but while in hospital felt gout was present in R ankle     On exam pt has B ankle selling L>R. However, R is mildly tender around B malleoli. Pt also has B pitting edema to the level of the upper shin about 5 cm below lower border of patella. R ankle swelling was noted in OT notes. Pt reports  swelling since was in hospital.  -Check Pro BNP - 607 today, up from 225 10/23  - Recheck ECHO, last ECHO 2022  -present in hospital per OT notes, does not improve with lasix   -Lasix  20 prn, change to 40 mg qd x 3 days  -Plain film R ankle  -consider allopurinol, checking chem today  -Compression socks recommended    Hypokalemia  -K 2.9 on labs today, rx K to patient  -Recheck labs 1 week    ?CKD  Pt discharged with mildly elevated creatinine (?2/2 colchicine ?)  -Rechecking chem today - Creat 1.07 today     HIV  History of HIV/AIDS with CD4 count 32 and first diagnosed in 2005, now recovered to 690 and undetectable viral RNA.  H/o right CMV retinitis, molluscum contagiosum, and secondary syphilis - all resolved.  Pt had low positive viral load this stay, previously not detected. However CD4 690. Possible switch to ?Cabenuva? Per patient  -Biktarvy  50-20-25     CAD s/p stent 2021   HLD  HTN  HFpEF  History of CAD ISO HLD s/p stent 2021, though not on aspirin at home. Is on Eliquis . Checking proBNP today as above  -Patient to discuss with outpatient cardiology to determine if needing ASA, he is overdue for 12 month follow up (last visit 09/2022).  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  ? MCI?  MRI from this admission showed small vessel likely ischemic changes. Today's visit notable for possible mild memory impairment.    Eczema  -Dupixent  q 14 d - has not taken in a few months due to subspecialty?  -Zyrtec  prn  -Atarx 25 prn - taking tid  -kenalog  prn     Hx DVT/PE  DVT 2012, on long-term anticoagulation as per hematology.    -Elquis 5 bid    Depression  -Sertraline  50    Peripheral neuropathy  -Gabapentin  100 tid - not taking    LE spasms?  -Robaxin  prn - not sure he's taking    GERD  EGD 2020 WNL  -omeprazole  40 bid - taking daily, not twice daily       Depression:  Continued home sertraline  50 mg daily       Patient provided with an updated and reconciled medications list.    Follow Up: No follow-ups on file.  Future Appointments   Date Time Provider Department Center   06/03/2024  1:00 PM Lyn Sanders, MD [facility]   06/03/2024  1:55 PM Consuelo Denmark, PA [facility]   06/11/2024  2:30 PM Lugo-Somolinos, Elna Haggis, MD [facility]   06/12/2024  3:00 PM Pink Bridges, MD [facility]     Medication adherence and barriers to the treatment plan have been addressed. Opportunities to optimize healthy behaviors have been discussed. Patient / caregiver voiced understanding.      CHIEF COMPLAINT/HISTORY OF PRESENT ILLNESS:   Hospital Follow-Up for Hospitalization Follow-up    History obtained from: Patient  Date of Hospitalization Discharge:  04/06/24     HPI:      The patient, with a history of seizures, possible meningitis, and diarrhea, reports no further seizures since hospital discharge. He denies any adverse effects from his current antiepileptic regimen.    His diarrhea, initially severe during his hospital stay, has persisted and is now more watery. He denies taking any antidiarrheal medications at home, but has  been taking stool softeners and possibly Miralax, which he was unaware could exacerbate his diarrhea.    The patient also reports right ankle swelling that began during his hospital stay. The swelling is intermittent and prevents him from wearing shoes, particularly when driving. He denies pain, but reports tenderness and stiffness in the ankle.    He also reports a history of gout, which he experiences daily, primarily affecting the right ankle. He is currently taking colchicine  for gout, but reports that he was advised to stop taking it.    The patient also has diabetes and eczema. He reports being able to obtain his diabetes medications, but is not currently taking Dupixent  for his eczema due to a fear of needles and a previous reaction. He expresses a preference for oral medications for his eczema.    Lastly, the patient reports difficulty with his right hand, which he attributes to a previous stroke. He reports difficulty writing and eating due to this issue.        MEDICATIONS AND ALLERGIES:   Reviewed and updated in Epic.  Medication and allergy review was completed by the pharmacist and discussed during this visit. Please see their note within this encounter.    SOCIAL/FAMILY HISTORY:  Social History:  Reviewed in Epic.  Biopsychosocial barriers to care were addressed by the social worker this visit.     REVIEW OF SYSTEMS:    All other systems reviewed are negative except as noted here or in HPI.    PHYSICAL EXAM  Vitals:    Vitals:    04/15/24 1309   BP: 132/56   Pulse: 94   Temp: 36.8 ??C (98.3 ??F)   SpO2: 99%       Exam:  General:  NAD  Lungs:  CTA B  Heart:  RRR  Ext:  B LE edema L>R by 1 cm. 1+ pitting edema to lower patella. Mild ttp R ankle, no pain with passive ROM.     Labs:  Reviewed from discharge.  See Epic labs.   Radiology: Reviewed radiology studies from discharge. See in Epic.   Reviewed: Discharge summary, Labs, Procedures, Imaging (e.g. xrays, CT, etc), and Pending Tests from discharge; See results in Epic  Discussed care with: Social worker and Pharmacist         SUMMARY OF ASSOCIATED HOSPITALIZATION    Ryan Davenport is a 63 y.o. male with a history of HIV on Biktarvy , DVT on AC, multiple prior infections (CMV right retinitis, molluscum contagium, secondary syphillis, septic arthritis), and T2DM who presented to [facility] on 03/30/2024 for new onset focal seizures thought to be secondary to viral meningitis based on CSF studies although no virus was identified.  ID was consulted.  Seizures were difficult to control on multiple ASM and patient should continue these until he can be seen in outpatient neurology clinic.  Course was complicated by diarrhea, for which GI was consulted.  He discharged home.     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  EEG showed left parasagittal focal electro-clinical seizures with 5 ASM initially required for control.  MRI brain with contrast unremarkable.  CSF with 13 nucleated cells with lymphocytic predominance raises suspicion for viral meningo-encephalitis as cause of acute symptomatic seizures in the setting of diarrhea although no virus  identified.  ID was consulted as below.  Patient should continue on ASM regimen below until neurology follow up when Keppra  should be weaned first.     ASM regimen:   - Keppra  1.5 g twice daily  -- Onfi  5 mg twice daily     Workup:  -- MRI brain w/wo 04/01/24 (epilepsy protocol): Unremarkable  --EEG (4/7-): Frequent focal electroclinical seizures arising from the left parasagittal region with clinical correlate of right arm flexion and clonic activity  -- Serum labs: WBC 6.2, CD4 count 690, HIV RNA 1369,  - CSF labs: 13 nucleated cells, less than 2 RBC, protein 42, glucose 94, culture no growth to date, CMV negative, EBV negative, enterovirus negative, HHV-6 negative, HSV 1/2 negative, VZV negative, crypto antigen negative, cytology with no malignant cells and mostly small lymphocytes without viral cytopathic effect, heme path with no blasts or abnormal lymphoid cells or monotypic B/T cells  -- Stool: GI pathogen panel negative  -- UA noninfectious     #Diarrhea  Patient has acute on chronic diarrhea. No blood in stool at home or inpatient. He states that taking large amounts of medications orally with no food is making his diarrhea worse. GIPP negative.  O&P pending.  GI was consulted.  Diarrhea is improving, can follow up outpatient with PCP.     #T2DM  History of poorly controlled type 2 diabetes with A1c 12 (increased from 6.4% 3 months prior).  Initial presentation with hyperglycemia at [facility], 4/5 to 500s; patient states this is due to recent medication non-compliance from difficulty with insurance approval of his insulin , which has been resolved.  On 12 units Lantus  nightly, recently restarted 4/1, and 5 units lispro 3 times daily AC.       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  Medical history complicated by multiple infections including right CMV retinitis, molluscum contagiosum, and secondary syphilis, all status post treatment.  Continued home Biktarvy . Patient received empiric acyclovir  until HSV labs returned negative.  ID was consulted when patient arrived due to concern for infectious cause of seizures given history of HIV.  Continue to follow with ID outpatient.     #CAD s/p stent 2021   #HLD  #HTN  #HFpEF  History of CAD ISO HLD s/p stent 2021, though not previously on aspirin at home.  Continued  home atorvastatin , Metoprolol , Lisinopril  and lasix .  Patient to discuss with outpatient cardiology to determine if needing ASA, he is overdue for 12 month follow up (last visit 09/2022).     #Hx DVT/PE  DVT 2012, on long-term anticoagulation as per hematology.  Held inpatient for LP and restarted on discharge.     #Hx Gout:  Restarted home colchicine  for gout flare in his right foot     #Depression:  Continued home sertraline  50 mg daily non-compliance from difficulty with insurance approval of his insulin , which has been resolved.  On 12 units Lantus  nightly, recently restarted 4/1, and 5 units lispro 3 times daily AC.       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  Medical history complicated by multiple infections including right CMV retinitis, molluscum contagiosum, and secondary syphilis, all status post treatment.  Continued home Biktarvy . Patient received empiric acyclovir  until HSV labs returned negative.  ID was consulted when patient arrived due to concern for infectious  cause of seizures given history of HIV.  Continue to follow with ID outpatient.     #CAD s/p stent 2021   #HLD  #HTN  #HFpEF  History of CAD ISO HLD s/p stent 2021, though not previously on aspirin at home.  Continued  home atorvastatin , Metoprolol , Lisinopril  and lasix .  Patient to discuss with outpatient cardiology to determine if needing ASA, he is overdue for 12 month follow up (last visit 09/2022).     #Hx DVT/PE  DVT 2012, on long-term anticoagulation as per hematology.  Held inpatient for LP and restarted on discharge.     #Hx Gout:  Restarted home colchicine  for gout flare in his right foot     #Depression:  Continued home sertraline  50 mg daily

## 2024-04-15 ENCOUNTER — Inpatient Hospital Stay: Admit: 2024-04-15 | Discharge: 2024-04-16 | Payer: Medicare (Managed Care)

## 2024-04-15 ENCOUNTER — Ambulatory Visit: Admit: 2024-04-15 | Discharge: 2024-04-16 | Payer: Medicare (Managed Care)

## 2024-04-15 ENCOUNTER — Ambulatory Visit
Admit: 2024-04-15 | Discharge: 2024-04-16 | Payer: Medicare (Managed Care) | Attending: Internal Medicine | Primary: Internal Medicine

## 2024-04-15 DIAGNOSIS — K219 Gastro-esophageal reflux disease without esophagitis: Principal | ICD-10-CM

## 2024-04-15 DIAGNOSIS — E66811 Obesity (BMI 30.0-34.9): Principal | ICD-10-CM

## 2024-04-15 DIAGNOSIS — M109 Gout, unspecified: Principal | ICD-10-CM

## 2024-04-15 DIAGNOSIS — Z794 Long term (current) use of insulin: Principal | ICD-10-CM

## 2024-04-15 DIAGNOSIS — E1140 Type 2 diabetes mellitus with diabetic neuropathy, unspecified: Principal | ICD-10-CM

## 2024-04-15 DIAGNOSIS — I10 Essential (primary) hypertension: Principal | ICD-10-CM

## 2024-04-15 DIAGNOSIS — F32A Depression, unspecified depression type: Principal | ICD-10-CM

## 2024-04-15 DIAGNOSIS — G40109 Localization-related (focal) (partial) symptomatic epilepsy and epileptic syndromes with simple partial seizures, not intractable, without status epilepticus: Principal | ICD-10-CM

## 2024-04-15 DIAGNOSIS — G609 Hereditary and idiopathic neuropathy, unspecified: Principal | ICD-10-CM

## 2024-04-15 DIAGNOSIS — B20 Human immunodeficiency virus [HIV] disease: Principal | ICD-10-CM

## 2024-04-15 DIAGNOSIS — I279 Pulmonary heart disease, unspecified: Principal | ICD-10-CM

## 2024-04-15 DIAGNOSIS — I2699 Other pulmonary embolism without acute cor pulmonale: Principal | ICD-10-CM

## 2024-04-15 DIAGNOSIS — Z09 Encounter for follow-up examination after completed treatment for conditions other than malignant neoplasm: Principal | ICD-10-CM

## 2024-04-15 DIAGNOSIS — I2510 Atherosclerotic heart disease of native coronary artery without angina pectoris: Principal | ICD-10-CM

## 2024-04-15 DIAGNOSIS — R197 Diarrhea, unspecified: Principal | ICD-10-CM

## 2024-04-15 DIAGNOSIS — L309 Dermatitis, unspecified: Principal | ICD-10-CM

## 2024-04-15 DIAGNOSIS — B081 Molluscum contagiosum: Principal | ICD-10-CM

## 2024-04-15 DIAGNOSIS — I252 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 infarction: Principal | ICD-10-CM

## 2024-04-15 DIAGNOSIS — I5032 Chronic diastolic (congestive) heart failure: Principal | ICD-10-CM

## 2024-04-15 LAB — URIC ACID: URIC ACID: 9 mg/dL (ref 3.7–9.2)

## 2024-04-15 LAB — BASIC METABOLIC PANEL
ANION GAP: 10 mmol/L (ref 5–14)
BLOOD UREA NITROGEN: 8 mg/dL — ABNORMAL LOW (ref 9–23)
BUN / CREAT RATIO: 7
CALCIUM: 9.1 mg/dL (ref 8.7–10.4)
CHLORIDE: 107 mmol/L (ref 98–107)
CO2: 26.9 mmol/L (ref 20.0–31.0)
CREATININE: 1.07 mg/dL (ref 0.73–1.18)
EGFR CKD-EPI (2021) MALE: 79 mL/min/1.73m2 (ref >=60)
GLUCOSE RANDOM: 77 mg/dL (ref 70–179)
POTASSIUM: 2.9 mmol/L — ABNORMAL LOW (ref 3.4–4.8)
SODIUM: 144 mmol/L (ref 135–145)

## 2024-04-15 LAB — PRO-BNP: PRO-BNP: 607 pg/mL — ABNORMAL HIGH (ref <=300.0)

## 2024-04-15 NOTE — Unmapped (Signed)
 [facility] Internal Medicine at [facility]     Reason for visit: Hospital Follow up    Questions / Concerns that need to be addressed: Feet swelling    Screening BP- 132/56     HCDM reviewed and updated in Epic:    We are working to make sure all of our patients??? wishes are updated in Epic and part of that is documenting a Environmental health practitioner for each patient  A Health Care Decision Maker is someone you choose who can make health care decisions for you if you are not able - who would you most want to do this for you????  is already up to date.    HCDM (patient stated preference): Ryan, Davenport - Mother - [phone]

## 2024-04-15 NOTE — Unmapped (Addendum)
 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 We reviewed your current medications and made adjustments to better manage your symptoms.    YOUR PLAN:    -GOUT: Gout is a type of arthritis caused by excess uric acid in the blood, leading to joint inflammation. We will recheck your kidney function to determine the appropriate dosing for allopurinol, which may be a better option for managing your gout symptoms.    -RIGHT ANKLE TENDERNESS and SWELLING: Your right ankle swelling may be related to gout or other factors. We will order an x-ray of your ankle, check your kidney function and fluid status, and consider increasing your Lasix  dosage for a few days. Using compression socks may also help reduce the swelling.    -DIABETES: Diabetes is a condition where your blood sugar levels are too high. You have resolved your medication access issues and are able to obtain your diabetes medications. Stop colchicine .    -SEIZURES: Seizures are sudden, uncontrolled electrical disturbances in the brain. Your seizures have improved since your hospitalization, and you are tolerating your current medications well.    -DIARRHEA: Diarrhea is the frequent passage of loose, watery stools. We recommend discontinuing stool softeners and laxatives like MiraLAX, Colace, and Dulcolax. If your diarrhea persists, you may consider using Imodium . Your infectious studies for the diarrhea did not identify an infectious cause.    -REFLUX DISEASE: Reflux disease, or GERD, is a condition where stomach acid frequently flows back into the tube connecting your mouth and stomach. It is being managed with omeprazole , which you take once daily.    -ECZEMA: Eczema is a condition that makes your skin red and itchy. Due to your needle phobia and previous adverse reaction to Dupixent , you can discuss alternative oral medications for eczema with your dermatologist.    INSTRUCTIONS:    1. Recheck kidney function to determine appropriate allopurinol dosing for gout management.  2. Order an x-ray of your right ankle.  3. Check labs for kidney function and fluid status.  4. Consider increasing Lasix  dosage for a few days, I will notify you with the dosage.  5. Use compression socks for bilateral ankle swelling.  6. Discontinue stool softeners and laxatives such as MiraLAX, Colace, and Dulcolax. Consider using Imodium  if diarrhea persists.  7. Discuss alternative oral medications for eczema with your dermatologist.

## 2024-04-15 NOTE — Unmapped (Signed)
 HOSPITAL FOLLOW UP SOCIAL WORK ASSESSMENT:     Licensed Clinical Social Worker (SW) met face to face with patient during their hospital follow-up appointment on 04/15/2024 to assess for care management needs.    Is there someone else in the room? No.     Home Health [facility]) Care :   does not have home health services  Current Home Health Agency:  New order needed?: no    Durable Medical Equipment (DME):   Patient reports use of bedside commode, cane, and walker.    DME Agency:  Denies need for repair on existing equipment    denies need for additional equipment at this time.   New order needed?: no  If yes, order needed for: n/a     Personal Care Service/Personal Aide [facility]):   does not have personal care services/personal aide support and denies need for support.    PCS Agency:   New order needed?: no      Specialist Follow-up:  Future Appointments   Date Time Provider Department Center   06/03/2024  1:00 PM Lyn Sanders, MD [facility]   06/03/2024  1:55 PM Geralyn Knee, Connye Delaine, PA [facility]   06/11/2024  2:30 PM Lugo-Somolinos, Elna Haggis, MD [facility]   06/12/2024  3:00 PM Pink Bridges, MD [facility]     Pt states he has heard from OP Therapy but has not scheduled with them yet, denies needing any assistance with getting connected with an appointment.   Housing and Aftercare Support:    Patient lives alone in a House in [place].   He denies issues with home safety.    The patient receives support from his mother.       Social Determinants of Health Screening: pt denies any SDOH, he has a monthly debit card from his Medicare Advantage plan to help with groceries and denies any financial insecurity at this time.       Social Drivers of Health     Food Insecurity: No Food Insecurity (03/27/2024)    Received from [facility]    Hunger Vital Sign     Worried About Running Out of Food in the Last Year: Never true     Ran Out of Food in the Last Year: Never true Tobacco Use: Low Risk  (04/04/2024)    Patient History     Smoking Tobacco Use: Never     Smokeless Tobacco Use: Never     Passive Exposure: Never   Transportation Needs: Unknown (03/27/2024)    Received from [facility] - Transportation     Lack of Transportation (Medical): No     Lack of Transportation (Non-Medical): Patient unable to answer   Alcohol  Use: Not At Risk (07/31/2023)    Alcohol  Use     How often do you have a drink containing alcohol ?: Never     How many drinks containing alcohol  do you have on a typical day when you are drinking?: 1 - 2     How often do you have 5 or more drinks on one occasion?: Never   Housing: Low Risk  (03/09/2024)    Housing     Within the past 12 months, have you ever stayed: outside, in a car, in a tent, in an overnight shelter, or temporarily in someone else's home (i.e. couch-surfing)?: No     Are you worried about losing your housing?: No   Physical Activity: Not  on file   Utilities: Not At Risk (03/27/2024)    Received from [facility] Utilities     Threatened with loss of utilities: No   Stress: Not on file   Interpersonal Safety: Not At Risk (03/09/2024)    Interpersonal Safety     Unsafe Where You Currently Live: No     Physically Hurt by Anyone: No     Abused by Anyone: No   Substance Use: Low Risk  (07/31/2023)    Substance Use     In the past year, how often have you used prescription drugs for non-medical reasons?: Never     In the past year, how often have you used illegal drugs?: Never     In the past year, have you used any substance for non-medical reasons?: No   Intimate Partner Violence: Not At Risk (03/27/2024)    Received from [facility]    Humiliation, Afraid, Rape, and Kick questionnaire     Fear of Current or Ex-Partner: No     Emotionally Abused: No     Physically Abused: No     Sexually Abused: No   Social Connections: Unknown (03/27/2024)    Received from [facility]    Social Connection and Isolation Panel [NHANES]     Frequency of Communication with Friends and Family: Patient declined     Frequency of Social Gatherings with Friends and Family: More than three times a week     Attends Religious Services: 1 to 4 times per year     Active Member of Clubs or Organizations: Patient declined     Attends Banker Meetings: Patient declined     Marital Status: Never married   Physicist, medical Strain: Low Risk  (03/31/2024)    Overall Financial Resource Strain (CARDIA)     Difficulty of Paying Living Expenses: Not hard at all   Depression: Not at risk (02/01/2023)    PHQ-2     PHQ-2 Score: 2   Internet Connectivity: Not on file   Health Literacy: Not on file           Upcoming appointments:  Future Appointments   Date Time Provider Department Center   06/03/2024  1:00 PM Lyn Sanders, MD [facility]   06/03/2024  1:55 PM Geralyn Knee, Connye Delaine, PA [facility]   06/11/2024  2:30 PM Lugo-Somolinos, Elna Haggis, MD [facility]   06/12/2024  3:00 PM Pink Bridges, MD [facility]       Reviewed upcoming clinic appointment(s): Yes.  Plans to keep appointment(s): Yes.  Transportation assistance needed: No    ------------------------------------------------------------------------------------------------------------------------------------------   Visit discussed with Dr Reva Castleman , PharmD, CPP and Ann Keto , MD who will also see patient during their HFU visit.      Family, social and cultural characteristics were assessed during the visit and plan.     Patient was informed they can call the clinic at [phone] with any additional questions or concerns.

## 2024-04-16 MED ORDER — POTASSIUM CHLORIDE ER 15 MEQ TABLET,EXTENDED RELEASE(PART/CRYST)
ORAL_TABLET | Freq: Two times a day (BID) | ORAL | 0 refills | 2.00 days | Status: CP
Start: 2024-04-16 — End: 2024-04-18

## 2024-04-16 MED ORDER — FUROSEMIDE 40 MG TABLET
ORAL_TABLET | Freq: Every day | ORAL | 0 refills | 3.00 days | Status: CP
Start: 2024-04-16 — End: 2024-04-19

## 2024-04-16 NOTE — Unmapped (Signed)
 Called for update on labs and radiograph results  Lasix  40 x3 days  KCl 30 mEq x 3 days  Repeat chem next week  ECHO ordered  Tylenol  and Voltaren for LE arthritis

## 2024-04-27 ENCOUNTER — Ambulatory Visit: Payer: 59 | Admitting: Podiatry

## 2024-06-02 NOTE — Unmapped (Signed)
 Specialty Medication(s): Dupixent    Ryan Davenport has been dis-enrolled from the [facility] specialty pharmacy services as a result of multiple unsuccessful outreach attempts by the pharmacy.    Additional information provided to the patient: see Amber's note from 2/25 for attempts.     [facility] Specialty Pharmacist

## 2024-06-03 ENCOUNTER — Ambulatory Visit
Admit: 2024-06-03 | Discharge: 2024-06-03 | Payer: Medicare (Managed Care) | Attending: Student in an Organized Health Care Education/Training Program | Primary: Student in an Organized Health Care Education/Training Program

## 2024-06-03 ENCOUNTER — Ambulatory Visit: Admit: 2024-06-03 | Discharge: 2024-06-03 | Payer: Medicare (Managed Care)

## 2024-06-03 DIAGNOSIS — Z79899 Other long term (current) drug therapy: Principal | ICD-10-CM

## 2024-06-03 DIAGNOSIS — Z5181 Encounter for therapeutic drug level monitoring: Principal | ICD-10-CM

## 2024-06-03 DIAGNOSIS — Z113 Encounter for screening for infections with a predominantly sexual mode of transmission: Principal | ICD-10-CM

## 2024-06-03 DIAGNOSIS — Z8619 Personal history of other infectious and parasitic diseases: Principal | ICD-10-CM

## 2024-06-03 DIAGNOSIS — E876 Hypokalemia: Principal | ICD-10-CM

## 2024-06-03 DIAGNOSIS — Z23 Encounter for immunization: Principal | ICD-10-CM

## 2024-06-03 DIAGNOSIS — Z9189 Other specified personal risk factors, not elsewhere classified: Principal | ICD-10-CM

## 2024-06-03 DIAGNOSIS — B20 Human immunodeficiency virus [HIV] disease: Principal | ICD-10-CM

## 2024-06-03 DIAGNOSIS — F32A Depression, unspecified depression type: Principal | ICD-10-CM

## 2024-06-03 DIAGNOSIS — R600 Localized edema: Principal | ICD-10-CM

## 2024-06-09 ENCOUNTER — Ambulatory Visit (INDEPENDENT_AMBULATORY_CARE_PROVIDER_SITE_OTHER): Admitting: Podiatry

## 2024-06-09 DIAGNOSIS — M79675 Pain in left toe(s): Secondary | ICD-10-CM

## 2024-06-09 DIAGNOSIS — B351 Tinea unguium: Secondary | ICD-10-CM

## 2024-06-09 DIAGNOSIS — M79674 Pain in right toe(s): Secondary | ICD-10-CM

## 2024-06-11 DIAGNOSIS — B20 Human immunodeficiency virus [HIV] disease: Principal | ICD-10-CM

## 2024-06-11 DIAGNOSIS — Z1633 Resistance to antiviral drug(s): Principal | ICD-10-CM

## 2024-06-11 DIAGNOSIS — B349 Viral infection, unspecified: Principal | ICD-10-CM

## 2024-06-11 NOTE — Progress Notes (Signed)
  Subjective:  Patient ID: Craig [name], male    DOB: 09-02-1961,  MRN: [id number]  Nails thickened elongated painful   63 y.o. male presents with the above complaint. History confirmed with patient.     Nails are thickened and elongated and bothering him.   Objective:  Physical Exam: warm, good capillary refill, no trophic changes or ulcerative lesions, normal DP and PT pulses, normal sensory exam.  Nails are thickened elongated yellow and discolored with subungual debris   Assessment:   1. Pain due to onychomycosis of toenails of both feet       Plan:  Patient was evaluated and treated and all questions answered.    Discussed the etiology and treatment options for the condition in detail with the patient. Recommended debridement of the nails today. Sharp and mechanical debridement performed of all painful and mycotic nails today. Nails debrided in length and thickness using a nail nipper to level of comfort. Discussed treatment options including appropriate shoe gear. Follow up as needed for painful nails.    Return in about 3 months (around 09/09/2024) for at risk diabetic foot care.

## 2024-06-12 ENCOUNTER — Ambulatory Visit: Admit: 2024-06-12 | Discharge: 2024-06-12 | Payer: Medicare (Managed Care)

## 2024-06-12 ENCOUNTER — Ambulatory Visit
Admit: 2024-06-12 | Discharge: 2024-06-12 | Payer: Medicare (Managed Care) | Attending: Student in an Organized Health Care Education/Training Program | Primary: Student in an Organized Health Care Education/Training Program

## 2024-06-12 DIAGNOSIS — I10 Essential (primary) hypertension: Principal | ICD-10-CM

## 2024-06-12 DIAGNOSIS — B20 Human immunodeficiency virus [HIV] disease: Principal | ICD-10-CM

## 2024-06-12 DIAGNOSIS — G40909 Epilepsy, unspecified, not intractable, without status epilepticus: Principal | ICD-10-CM

## 2024-06-12 DIAGNOSIS — L209 Atopic dermatitis, unspecified: Principal | ICD-10-CM

## 2024-06-12 DIAGNOSIS — K529 Noninfective gastroenteritis and colitis, unspecified: Principal | ICD-10-CM

## 2024-06-12 DIAGNOSIS — E1140 Type 2 diabetes mellitus with diabetic neuropathy, unspecified: Principal | ICD-10-CM

## 2024-06-12 DIAGNOSIS — I5032 Chronic diastolic (congestive) heart failure: Principal | ICD-10-CM

## 2024-06-12 DIAGNOSIS — Z794 Long term (current) use of insulin: Principal | ICD-10-CM

## 2024-06-12 MED ORDER — TRIAMCINOLONE ACETONIDE 0.1 % TOPICAL OINTMENT
Freq: Two times a day (BID) | TOPICAL | 5 refills | 0.00000 days | Status: CP
Start: 2024-06-12 — End: 2025-06-12

## 2024-06-12 MED ORDER — BIKTARVY 50 MG-200 MG-25 MG TABLET
ORAL_TABLET | Freq: Every day | ORAL | 1 refills | 30.00000 days | Status: CP
Start: 2024-06-12 — End: ?

## 2024-06-12 MED ORDER — CLOBAZAM 10 MG TABLET
ORAL_TABLET | Freq: Two times a day (BID) | ORAL | 5 refills | 30.00000 days | Status: CP
Start: 2024-06-12 — End: 2024-12-09

## 2024-06-12 MED ORDER — HYDROCORTISONE 2.5 % TOPICAL OINTMENT
Freq: Two times a day (BID) | TOPICAL | 5 refills | 0.00000 days | Status: CP
Start: 2024-06-12 — End: 2025-06-12

## 2024-06-12 MED ORDER — BETAMETHASONE DIPROPIONATE 0.05 % TOPICAL CREAM
Freq: Two times a day (BID) | TOPICAL | PRN refills | 30.00000 days | Status: CP
Start: 2024-06-12 — End: ?

## 2024-06-12 MED ORDER — LEVETIRACETAM 750 MG TABLET
ORAL_TABLET | Freq: Two times a day (BID) | ORAL | 5 refills | 30.00000 days | Status: CP
Start: 2024-06-12 — End: 2024-12-09

## 2024-06-22 DIAGNOSIS — L209 Atopic dermatitis, unspecified: Principal | ICD-10-CM

## 2024-06-22 MED ORDER — DUPIXENT 300 MG/2 ML SUBCUTANEOUS SYRINGE
SUBCUTANEOUS | 11 refills | 28.00000 days | Status: CP
Start: 2024-06-22 — End: 2024-06-22
  Filled 2024-07-10: qty 4, 28d supply, fill #0

## 2024-06-22 MED ORDER — TRIAMCINOLONE ACETONIDE 0.1 % TOPICAL OINTMENT
TOPICAL | 10 refills | 0.00000 days | Status: CP
Start: 2024-06-22 — End: ?

## 2024-06-22 NOTE — Unmapped (Signed)
 Dermatology Note     Assessment and Plan:      Atopic dermatitis, chronic, severe after being off Dupixent   - Patient has been following with Dr. Carole for the past 15 years and exam is consistent with atopic dermatitis. Review of chart shows that patient had previously been on methotrexate  as well as Dupixent , and had significant improvement while on Dupixent . Patient discontinued Dupixent  because he states he had recent seizures and is no longer able to inject himself and does not have anyone at home that can help him with injections. Patient plans to talk to PCP about setting up home health to deliver Dupixent  injections.  - Note that patient has a history of poorly controlled HIV and T2DM  - Continue with Dupixent  300mg  every 14 days. Rx sent to [facility] today.  - Continue triamcinolone  (KENALOG ) 0.1 % ointment; Apply the medication twice daily to affected areas of the skin until smooth. Then stop and re-start as soon as the skin changes come back.  - Prior L leg duplex was negative for DVT     The patient was advised to call for an appointment should any new, changing, or symptomatic lesions develop.     RTC: Return in about 3 months (around 09/22/2024) for Eczema. or sooner as needed   _________________________________________________________________      Chief Complaint     Chief Complaint   Patient presents with    Follow-up     On dermatitis; he was taking Dupixent , but has not taking it since February.        HPI     Ryan Davenport is a 63 y.o. male who presents as a returning patient (last seen 01/10/2024) to Dermatology for follow up of atopic dermatitis.     History of Present Illness  Ryan Davenport is a 63 year old male with eczema who presents with difficulty managing his condition due to medication administration issues.    He was on Dupixent  injections until May, when insurance and pharmacy issues caused a lapse in treatment. Since stopping Dupixent , he experiences increased skin flares and itching, especially with heat exposure. He uses clobetasol  and triamcinolone  creams for relief. Significant itching occurs at night, disrupting sleep, and is alleviated by soaking in warm water and massaging his legs. His skin is thickened from chronic scratching.    He is concerned about self-administering Dupixent  due to hand tremors from a stroke. He is exploring assistance options for injections, such as home health services. He uses triamcinolone  and hydrocortisone  topicals but finds a recent formulation change less effective. He prefers a Vaseline-like mixture that was previously compounded for him.    The patient denies any other new or changing lesions or areas of concern.     Pertinent Past Medical History     Past Medical History, Family History, Social History, Medication List, Allergies, and Problem List were reviewed in the rooming section of Epic.     ROS: Other than symptoms mentioned in the HPI, no fevers, chills, or other skin complaints    Physical Examination     GENERAL: Well-appearing male in no acute distress, resting comfortably.  NEURO: Alert and oriented, answers questions appropriately  SKIN: Examination of the face, chest, abdomen, back, bilateral upper extremities, and sun exposed portions of lower extremities was performed    - Diffuse xerosis with erythematous papules and plaques with evidence of lichenification and pigment alteration on the trunk and extremities     All areas not commented  on are within normal limits or unremarkable    (Approved Template 09/05/2020)

## 2024-06-23 NOTE — Unmapped (Signed)
 I saw and evaluated the patient with the resident, and was available for the procedural portions of the service and present for key portions and all procedures under 5 min. I reviewed the resident???s note. I agree with the resident???s HPI, exam, assessment and plan.   Benancio Deeds, MD
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
## 2024-06-23 NOTE — Unmapped (Signed)
 [facility]DP Specialty Medication Onboarding    Specialty Medication: DUPIXENT  SYRINGE 300 mg/2 mL syringe (dupilumab )  Prior Authorization: Approved   Financial Assistance: No - copay  <$25  Final Copay/Day Supply: $0 / 28    Insurance Restrictions: None     Notes to Pharmacist:   Credit Card on File: not applicable  Start Date on Rx:  06/22/24    The triage team has completed the benefits investigation and has determined that the patient is able to fill this medication at [facility]. Please contact the patient to complete the onboarding or follow up with the prescribing physician as needed.

## 2024-07-09 MED ORDER — EMPTY CONTAINER
2 refills | 0.00000 days
Start: 2024-07-09 — End: ?

## 2024-07-10 MED FILL — EMPTY CONTAINER: 120 days supply | Qty: 1 | Fill #0

## 2024-07-14 ENCOUNTER — Inpatient Hospital Stay: Admit: 2024-07-14 | Discharge: 2024-07-15 | Payer: Medicare (Managed Care)

## 2024-07-14 ENCOUNTER — Ambulatory Visit: Admit: 2024-07-14 | Discharge: 2024-07-15 | Payer: Medicare (Managed Care)

## 2024-07-14 DIAGNOSIS — B349 Viral infection, unspecified: Principal | ICD-10-CM

## 2024-07-14 DIAGNOSIS — B20 Human immunodeficiency virus [HIV] disease: Principal | ICD-10-CM

## 2024-07-14 DIAGNOSIS — Z1633 Resistance to antiviral drug(s): Principal | ICD-10-CM

## 2024-07-14 DIAGNOSIS — R590 Localized enlarged lymph nodes: Principal | ICD-10-CM

## 2024-07-14 NOTE — Unmapped (Signed)
 Ultrasound ordered on neck lump  Chest xray today  Labs today 1st floor

## 2024-07-14 NOTE — Unmapped (Addendum)
 Internal Medicine Clinic Visit    Reason for visit: Follow Up    A/P:          1. Left cervical lymphadenopathy    Continues to present with left post auricular lymph node. Pt also has night sweats for the past month. Mild weight loss since last visit. Previously attributed to atropic dermatitis flare. Given patient's risk factors, malignancy and infectious etiology possible. Pt has appointment with infectious disease next week, instructed pt to discuss this at their next visit. In the mean time, will start the work-up.  - Last Chest XR on 04/2022 normal. Recommended repeat Chest XR.   - Quantiferon TB Gold negative in 12/24. Repeated Quantiferon TB Gold today.  - Recommended US  of Soft Tissue of the Head and Neck.     2. HIV on Bictarvy  Pt follows with [facility]. Next appointment is 06/22/2024. Pt discussed transitioning to long acting injectable at next appointment.   - Has been taking Bictarvy daily. .  - Will add repeat HIV RNA and genosure prime to current labs in preparation for appointment.    3. Essential HTN  Pt does not monitor BP regularily at home. BP consistently in upper 130's systolic and upper 70's diastolic.   - Continue metoprolol  succinate 12.5mg  daily    4. Type DM (non insulin  dependent)  Pt does not check glucose routinely. When he does check, glucose is in the 90's to 120's.   Due to this, has been off insulin . He is compliant with Jardiance . Discussed using a GCM with patient. Due to improvement in A1C, will hold off on this for now. May reconsider in the future.   - A1C 6.1 on 62/22/2025.   - Continue Jardiance  25mg  daily.     5. Prior Hx of PE on Eliquis  (2012)  - Continue Eliquis     6. Atopic Dermatitis   Pt was seen by dermatology on 06/22/2024 and started on Dupixent . Pt has received medication but has not started treatment. Pt is able to give self injections.   - Continue Dupixent    - Offered pt assistance with Dupixent  administration if needed in the future    7. CAD  - Continue atorvastatin  40mg  daiy.    8. Time - >97min spent w/ >50% in face-to-face education/counselling and treatment planning regarding above issues.    **Chest x-ray, TB quant unremarkable. U/s reveals: *  Bilateral subcentimeter lymph nodes with benign characteristics.  *  Single right level 5 lymph node measuring 0.5 cm with loss of the normal fatty hilum, potentially reactive.  Short term interval follow up is recommended with repeat ultrasound in 6 weeks.  Plan to discuss next visit.      Return in about 3 months (around 10/62/2025) for Next scheduled follow up. for Next scheduled follow up.    Staffed with Dr. Ashok Leveda Hamilton, seen and discussed  __________________________________________________    HPI:  [name] is a 63 y/o M with a PMHx of HIV, HTN, Type 2DM, PE, Atopic dermatitis, and CAD who presents in follow up.     Dermatitis: Pt received dupixent  but has not taken it yet. He is comfortable self injecting the medication. He states that his skin is better today.    DM: Pt does not measure blood glucose regularly. Has been controlled in the 90's to 120's. Has been taking Jardiance . Is off insulin .     Cervical lymphadenopathy: Started last year, with atopic dermatitis flare. Described a new nots in the neck with associated itching at  night and night sweats that started a month ago.      Has not been able to monitor BP at home. Has both an arm and wrist cuff.     Pt denies new sexual partners.   __________________________________________________________    Medications:  Reviewed in EPIC  __________________________________________________________    Physical Exam:   Vital Signs:  Vitals:    07/14/24 1253   BP: 136/79   BP Site: L Arm   BP Position: Sitting   BP Cuff Size: Small   Pulse: 97   Temp: 36.2 ??C (97.1 ??F)   TempSrc: Temporal   SpO2: 97%   Weight: (!) 103 kg (227 lb)   Height: 175.3 cm (5' 9)       PTHomeBP    Has not been able to monitor at home.     Gen: Well appearing, NAD  CV: regular rhythm, tachycardic, no murmurs  Pulm: CTA bilaterally, no crackles or wheezes  Abd: Soft, NTND, normal BS.   Ext: Wearing compression stockings   Lymph: Pt has 1x1 inch left post auricular lymph node. Anterior cervical chain lymphadenopathy on the right.     Medication adherence and barriers to the treatment plan have been addressed. Opportunities to optimize healthy behaviors have been discussed. Patient / caregiver voiced understanding.    Jessica Saganowich, MD   PGY1

## 2024-07-14 NOTE — Unmapped (Signed)
 [facility] at [facility]     Reason for visit:     Questions / Concerns that need to be addressed:     Screening BP-     Omron BPs (complete if screening BP has a systolic  > 130 or diastolic > 80)  BP#1    BP#2   BP#3     Average BP   (please note this as a comment in vitals)     PTHomeBP     Diabetes:  Regularly checking blood sugars?:   If yes, when? Complete log for past 7 days  Date Before Breakfast After Breakfast Before Lunch After Lunch Before Dinner After Dinner Before Bed                                                                                                                                     Dexcom or Libre CGM in use? If so, pull appropriate reporting through portal (Dexcom) or EPIC order [name].    HCDM reviewed and updated in Epic:    We are working to make sure all of our patients??? wishes are updated in Epic and part of that is documenting a Environmental health practitioner for each patient  A Health Care Decision Maker is someone you choose who can make health care decisions for you if you are not able - who would you most want to do this for you????      HCDM (patient stated preference): [place], [place] - Mother - [phone]    BPAs completed:      Annual Screenings:     __________________________________________________________________________________________    SCREENINGS COMPLETED IN FLOWSHEETS      AUDIT       PHQ2       PHQ9          GAD7       COPD Assessment       Falls Risk

## 2024-07-16 LAB — QUANTIFERON TB GOLD PLUS
QUANTIFERON ANTIGEN 1 MINUS NIL: 0.01 [IU]/mL
QUANTIFERON ANTIGEN 2 MINUS NIL: 0.02 [IU]/mL
QUANTIFERON MITOGEN: 9.94 [IU]/mL
QUANTIFERON TB GOLD PLUS: NEGATIVE
QUANTIFERON TB NIL VALUE: 0.06 [IU]/mL

## 2024-07-16 LAB — HIV RNA, QUANTITATIVE, PCR
HIV RNA QNT RSLT: DETECTED — AB
HIV RNA: <20 {copies}/mL — ABNORMAL HIGH (ref <0)

## 2024-07-16 LAB — TB NIL: TB NIL VALUE: 0.06

## 2024-07-16 LAB — TB AG1: TB AG1 VALUE: 0.07

## 2024-07-16 LAB — TB MITOGEN: TB MITOGEN VALUE: 10

## 2024-07-16 LAB — TB AG2: TB AG2 VALUE: 0.08

## 2024-07-22 ENCOUNTER — Encounter: Admit: 2024-07-22 | Discharge: 2024-07-23 | Payer: Medicare (Managed Care)

## 2024-07-22 ENCOUNTER — Ambulatory Visit: Admit: 2024-07-22 | Discharge: 2024-07-23 | Payer: Medicare (Managed Care)

## 2024-07-22 DIAGNOSIS — T50905A Adverse effect of unspecified drugs, medicaments and biological substances, initial encounter: Principal | ICD-10-CM

## 2024-07-22 DIAGNOSIS — B181 Chronic viral hepatitis B without delta-agent: Principal | ICD-10-CM

## 2024-07-22 DIAGNOSIS — Z113 Encounter for screening for infections with a predominantly sexual mode of transmission: Principal | ICD-10-CM

## 2024-07-22 DIAGNOSIS — Z9189 Other specified personal risk factors, not elsewhere classified: Principal | ICD-10-CM

## 2024-07-22 DIAGNOSIS — A539 Syphilis, unspecified: Principal | ICD-10-CM

## 2024-07-22 DIAGNOSIS — M818 Other osteoporosis without current pathological fracture: Principal | ICD-10-CM

## 2024-07-22 DIAGNOSIS — B20 Human immunodeficiency virus [HIV] disease: Principal | ICD-10-CM

## 2024-07-22 DIAGNOSIS — Z23 Encounter for immunization: Principal | ICD-10-CM

## 2024-07-22 DIAGNOSIS — Z5181 Encounter for therapeutic drug level monitoring: Principal | ICD-10-CM

## 2024-07-22 LAB — BASIC METABOLIC PANEL
ANION GAP: 8 mmol/L (ref 5–14)
BLOOD UREA NITROGEN: 14 mg/dL (ref 9–23)
BUN / CREAT RATIO: 8
CALCIUM: 9.3 mg/dL (ref 8.7–10.4)
CHLORIDE: 106 mmol/L (ref 98–107)
CO2: 24.5 mmol/L (ref 20.0–31.0)
CREATININE: 1.72 mg/dL — ABNORMAL HIGH (ref 0.73–1.18)
EGFR CKD-EPI (2021) MALE: 45 mL/min/1.73m2 — ABNORMAL LOW (ref >=60)
GLUCOSE RANDOM: 93 mg/dL (ref 70–179)
POTASSIUM: 3.9 mmol/L (ref 3.4–4.8)
SODIUM: 138 mmol/L (ref 135–145)

## 2024-07-22 LAB — HEPATITIS B DNA, QUANTITATIVE, PCR: HBV DNA QUANT: NOT DETECTED

## 2024-07-22 MED ORDER — BIKTARVY 50 MG-200 MG-25 MG TABLET
ORAL_TABLET | Freq: Every day | ORAL | 3 refills | 90.00000 days | Status: CP
Start: 2024-07-22 — End: ?

## 2024-07-22 NOTE — Unmapped (Signed)
 Thank you for coming in today.    Please note that your laboratory and other results may be visible to you in real time, possibly before they reach your provider. Please allow 48 hours for clinical interpretation of these results. Importantly, even if a result is flagged as abnormal, it may not be one that impacts your health.    The ID clinic phone number is [phone] (toll free [phone]).  The ID clinic fax number is [phone].    For urgent issues on nights and weekends you may reach the ID Physician on call through the [facility] Operator at [phone].     Verla Glaze, MD    [facility] Infectious Diseases Clinic at [facility]  [address]   [place], [place] [address]  Phone: [phone]   Fax: [phone]

## 2024-07-22 NOTE — Unmapped (Signed)
 Infectious Diseases Clinic        HIV (dx'd 2005, nadir CD4 30 in 2005)  Most recent CD4 690/30% 03/30/2024  Current regimen: Biktarvy  (BIC/FTC/TAF)  Misses doses of ARVs occasionally, but recently increased adherence after running out for several months  Recently had VL elevated to 1594 as of 6/11, in setting of several months off ART, then down to <20 on 7/22.     Med access via Medicaid  CD4 count over 300 for >2Y on suppressive ART; no prophylaxis needed; recheck CD4 annually  Discussed ARV adherence and injectable ARVs. He is interested in [place].    Lab Results   Component Value Date    ACD4 690 03/30/2024    CD4 30 (L) 03/30/2024    HIVRS Detected (A) 07/14/2024    HIVCP <20 (H) 07/14/2024       No labs today  Will refer to start Cabenuva. Refer to 12/11/23 ID clinic visit note for prior Cabenuva documentation  Will check Hep B DNA given prior positive core antibody.  Continue current therapy in meantime  Discussed importance of ARV adherence     L posterior auricular lymphadenopathy  Noted PCP visit on 7/22. Patient reports it has been present for several years, nontender, not increasing in size.   IGRA negative, CXR unremarkable.  US  ordered by PCP and scheduled for tomorrow  Lower suspicion for infectious or malignant etiology given long term stability    Hx Syphilis, treated in 2011  6/11 RPR positive at 1:4. Has hx treated syphilis, with intermittently positive RPR at 1:4 (CareEverywhere). 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 Has not yet been scheduled for Neurology followup. Will ask again Neurology to have him scheduled.    Diarrhea  April 2025 GIPP, C diff, and stool O&P were negative  June 2025: saw GI, recommended fiber supplementation and non urgent screening colonoscopy. Ordered, but not yet scheduled.  Today patient reports it has improved somewhat, mainly just an issue when he takes meds on an empty stomach    Anxiety/depression  On sertraline   Reports prior depression has improved somewhat, he attributes to recent hospitalization. Declines need for counseling at this time.    Issues not discussed at today's visit:    LLE Edema  Hx PE  Hx left popliteal DVT 01/2023  BLE DVT US  09/25/23 with abnormalities consistent with prior venous obstructive process with findings that appear chronic. On long apixaban .    Insulin -dependent DM2  Peripheral neuropathy  Obesity  Management per PCP. Last A1c 6.1.    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 Referred to ENT but they were unable to contact him.    Atopic Dermatitis  On Dupixent , triamcinolone  cream, hydrocortisone  cream, and cetirizine     07/22/24: Summary of diagnoses and orders for today's visit:   Diagnosis ICD-10-CM Associated Orders   1. HIV disease  B20 Basic Metabolic Panel     Dexa Bone Density Skeletal      2. Screen for sexually transmitted diseases  Z11.3       3. Need for tetanus, diphtheria, and acellular pertussis (Tdap) vaccine  Z23 Tdap vaccine greater than or equal to 7yo IM      4. At risk for loss of bone density  Z91.89       5. Syphilis  A53.9       6. Human immunodeficiency virus (HIV) disease     B20 BIKTARVY  50-200-25 mg tablet      7. Hepatitis B, chronic     B18.1 Hepatitis B DNA, Quantitative, PCR      8. Therapeutic drug monitoring  Z51.81 Hepatitis B DNA, Quantitative, PCR      9. Drug-induced osteoporosis  M81.8 Dexa Bone Density Skeletal T50.905A               Disposition  Return in about 3 months (around 10/22/2024).    To do @ next RTC  Assess transition to Cabenuva  MMR needed-contraindicated while on Dupixent   Shingrix-pt says he will get it at local pharmacy [facility])    I personally spent 96 minutes face-to-face and non-face-to-face in the care of this patient, which includes all pre, intra, and post visit time on the date of service.  All documented time was specific to the E/M visit and does not include any procedures that may have been performed.      Ryan Bibber, MD                Chief Complaint   Follow-up for HIV    HPI  In addition to details in A&P above  Ryan Davenport is a 63 y.o. 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 Previously followed with Ryan Davenport in [place] there for 6 years. Moved back to [place] in August and established care at [facility] 11/2023.    Since last visit patient reports well overall  No seizures  Does notice R hand shaking while he drives  Taking AEDs    Now getting Biktarvy  at local pharmacy  No missed doses at all  Interested in injections  No issues with transportation  Cell phone is off but will be reactivated on Friday    Diarrhea occasionally happening if he takes meds on an empty stomach  Otherwise bowel movements are ok    Noticed small nontender posterior auricular note on L side several years ago  Still present, occasionally itchy  Does not think it's getting better or worse           BP 111/69 (BP Site: L Arm, BP Position: Sitting, BP Cuff Size: Large)  - Pulse 95  - Temp 36.8 ??C (98.2 ??F) (Temporal)  - Resp 18  - Ht 175.3 cm (5' 9)  - Wt (!) 104.5 kg (230 lb 6.4 oz)  - BMI 34.02 kg/m??    Wt Readings from Last 3 Encounters:   07/22/24 (!) 104.5 kg (230 lb 6.4 oz)   07/14/24 (!) 103 kg (227 lb)   06/12/24 (!) 103 kg (227 lb)       Physical Exam:  Constitutional: No acute distress  Eyes: Lids and lashes normal.  Anicteric sclera.  ENT: Small nontender L posterior auricular node.  Neck: Supple without any enlargement  Pulmonary: Normal work of breathing.  Symmetric chest wall movement.  Skin/Integument: Warm and dry  without rash  Musculoskeletal: Full range of motion.  Neurologic: Alert and oriented to person, place and time.  Cranial nerves grossly intact.  Moves all extremities.  Psychiatry: Appropriate           Health maintenance      Oral health   The patient does or does not have a dentist. Last dental exam unsure-needs to establish care.    Eye health  The patient does not use corrective lenses. Last eye exam about 2 months ago.    Metabolic conditions  Wt Readings from Last 5 Encounters:   07/22/24 (!) 104.5 kg (230 lb 6.4 oz)   07/14/24 (!) 103 kg (227 lb)   06/12/24 (!) 103 kg (227 lb)   06/12/24 (!) 102.3 kg (225 lb 9.6 oz)   06/03/24 (!) 102.5 kg (226 lb)     Lab Results   Component Value Date    CREATININE 1.32 (H) 06/12/2024    PROTEINUA 200 mg/dL (A) 93/88/7974    PROTEINUR 88 12/13/2013    GLUCOSEU Negative 06/03/2024    ALBCRERAT 976.1 (H) 06/03/2024    PCRATIOUR 0.457 12/13/2013    GLU 95 06/12/2024    A1C 6.1 07/14/2024    ALT 11 06/03/2024    ALT 12 04/04/2024    ALT 18 03/30/2024     # Kidney health - SCr and eGFR  # Bone health - DEXA ordered  # Diabetes assessment - defer mgm't to PCP  # NAFLD assessment - assessment(s) needed but deferred to future visit    Communicable diseases  Lab Results   Component Value Date    QFTTBGOLD Negative 07/14/2024    HEPAIGG Reactive (A) 08/16/2014    HEPBSAB Reactive (A) 07/08/2019    HEPCAB Nonreactive 12/11/2023    MUMPSIGG Negative (A) 12/11/2023    RUBIG Negative 12/11/2023     # TB screening - no longer needed; negative IGRA, low risk  # Hepatitis screening - meets criteria for annual HCV (re)screening per DHHS & HIVMA. Screened last 12/24.  # MMR screening - nonimmune, MMR ordered but contraindicated while on dupixent     Lab Results   Component Value Date    RPR Reactive (A) 06/03/2024 RPR 1:4 06/03/2024    CTNAA Negative 06/03/2024    CTNAA Negative 06/03/2024    CTNAA Negative 06/03/2024    GCNAA Negative 06/03/2024    GCNAA Negative 06/03/2024    GCNAA Negative 06/03/2024    SPECSOURCE Rectum 06/03/2024    SPECSOURCE Throat 06/03/2024    SPECSOURCE Urine 06/03/2024     GC/CT NAATs - patient declined screening  RPR - repeat 6 months after prior     Cancer screening  Lab Results   Component Value Date    PSA 0.42 12/11/2023    FINALDX  03/31/2024     A:  Cerebrospinal fluid, cytospin review and flow cytometry   -  No blasts or morphologically abnormal lymphoid cells identified  -  No monotypic B-cell or overtly aberrant T-cell population by flow cytometric analysis    This electronic signature is attestation that the pathologist personally reviewed the submitted material(s) and the final diagnosis reflects that evaluation.       # Cervical - no indication for screening  # Breast - no indication for screening  # Anorectal - repeat anal cytology w/reflex hrHPV 59m from prior (negative 12/11/23)  # Colorectal - referred for colonoscopy already  # Liver - no screening indicated  # Lung - screening not indicated  #  Prostate - repeat PSA 27m from prior    Cardiovascular disease  Lab Results   Component Value Date    CHOL 156 12/11/2023    HDL 43 12/11/2023    LDL 91 12/11/2023    NONHDL 113 12/11/2023    TRIG 109 12/11/2023     # The ASCVD Risk score (Arnett DK, et al., 2019) failed to calculate.  - is not taking aspirin (on Eliquis )  - is taking statin  - BP control fair  - never smoker  # AAA screening - no indication for screening    Immunization History   Administered Date(s) Administered    COVID-19 VAC,BIVALENT(38YR UP),PFIZER 09/18/2021    COVID-19 VACC,MRNA,(PFIZER)(PF) 10/28/2020, 12/28/2020    Covid-19 Vac, (4yr+) (Comirnaty) Mrna Pfizer  12/11/2023    Covid-19 Vac, (27yr+) (Spikevax) Monovalent Moderna 10/19/2022    Hepatitis A (Adult) 04/21/2012, 08/21/2012    INFLUENZA TIV (TRI) PF (IM)(HISTORICAL) 11/13/2006, 10/13/2007, 09/12/2009, 11/15/2010, 01/24/2012, 10/29/2012    INFLUENZA VACCINE IIV3(IM)(PF)6 MOS UP 10/10/2023    Influenza Vaccine Quad (IIV4 PF) 6-53mo 09/28/2013    Influenza Vaccine Quad (IIV4 W/PRESERV) 57MO+ 09/18/2021    Influenza Vaccine Quad(IM)6 MO-Adult(PF) 09/28/2013, 09/06/2014, 09/15/2016, 09/30/2017, 12/30/2017, 09/11/2018, 01/22/2019, 09/07/2019, 09/15/2020, 09/18/2021    Influenza Virus Vaccine, unspecified formulation 10/29/2012, 09/29/2015    Meningococcal ACWY, Unspecified Formulation 01/01/2017    Meningococcal Conjugate MCV4P 01/01/2017    PNEUMOCOCCAL POLYSACCHARIDE 23-VALENT 09/12/2009, 04/17/2014, 01/25/2016    PPD Test 10/13/2007, 11/02/2008, 04/19/2010, 06/06/2011, 07/06/2011    Pneumococcal Conjugate 13-Valent 09/12/2009    Pneumococcal Conjugate 20-valent 01/17/2022    SHINGRIX-ZOSTER VACCINE (HZV),RECOMBINANT,ADJUVANTED(IM) 12/11/2023    TdaP 03/08/2014       Immunizations today - Tdap (1 dose per pregnancy *or* 10Y after prior)    Past Medical History:   Diagnosis Date    Asthma (HHS-HCC)     Asymptomatic human immunodeficiency virus (HIV) infection status    04/09/2013    Diagnosed in 2005 with a CD4 of 32 and on at least one prior regimen on a study, then switched to Truvada with ritonavir boosted atazanavir. Changed to Truvada with boosted darunavir secondary to reflux symptoms. Has remained undetectable for several years with CD4 count greater than 500.      CAD (coronary artery disease)     Depression     Diabetes mellitus        DVT (deep venous thrombosis)        Eczema     Enlargement of liver     GERD (gastroesophageal reflux disease)     HIV (human immunodeficiency virus infection)        Hyperlipidemia     Hypertension     Myocardial infarction        Obesity     PICC (peripherally inserted central catheter) removal 04/21/2021    Pulmonary embolism    2012    Sleep apnea    Seizures  Reports prior hx of seizures when he had a GI bleed but has never been on anti-epileptics    Social History  Background - [employer] - Apt-lives alone  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 Last sexually active about a year ago.  Parts of body used during sex include: anus/rectum and penis. Versatile for anal sex. Does not have oral sex.  Does not have vaginal sex.   In past 6 months has had insertive anal and receptive anal sex and has had add'l STI screening.  He always uses condoms for anal sex  He does routinely discuss HIV status with partner(s).  Have not discussed interest in having children.    Medications and Allergies  He has a current medication list which includes the following prescription(s): atorvastatin , betamethasone  dipropionate, biktarvy , glucose blood, blood-glucose meter, cetirizine , clobazam , colchicine , cyclobenzaprine , dupixent  syringe, eliquis , empagliflozin , empty container, furosemide , gabapentin , hydrochlorothiazide, hydrocortisone , hydroxyzine , klor-con  m15, lancets, levetiracetam , lisinopril , methocarbamol , metoprolol  succinate, omeprazole , pen needle, diabetic, [Paused] rybelsus , sertraline , triamcinolone , triamcinolone , and [DISCONTINUED] citalopram .    Allergies: Latex, Shellfish containing products, Metformin , and Penicillins    Family History  His family history includes Aneurysm in his father; Arthritis in his mother; Diabetes in his mother; Heart disease in his mother; Hypertension in his mother.  Father: brain aneurysm-father  Mother: diabetes, HTN, heart disease  1 brother and 2 sisters

## 2024-07-22 NOTE — Unmapped (Signed)
 Pt received TDAP injection as per MD order in left deltoid. Tolerated injection well.

## 2024-07-23 ENCOUNTER — Inpatient Hospital Stay: Admit: 2024-07-23 | Discharge: 2024-07-23 | Payer: Medicare (Managed Care)

## 2024-07-24 NOTE — Unmapped (Signed)
 HPROMIS Tablet Screening  Completed Date: 07/22/2024     SW reviewed self-administered screening.  Jonas Merritt Irva Loser MSW LCSW   Patient had a PHQ-9 score of  9  indicating Mild   depression.   Pt denies SI.   Pt scored 0 on AUDIT/AUDIT-C indicating Not-at-risk alcohol  consumption  Pt  denies substance use in past 3 months.  Pt denies concerns for IPV.    Pt seen by provider for regular ID visit.  Pt was not seen in person by Social Work during this visit.       Raveen Wieseler L.Lekha Dancer, MSW LCSW  [facility] ID Social Work

## 2024-07-27 NOTE — Unmapped (Signed)
 I saw and evaluated the patient, participating in the key portions of the service.  I reviewed the resident???s note.  I agree with the resident???s findings and plan. Yvonna Alanis, MD

## 2024-07-28 NOTE — Unmapped (Signed)
 Copied from CRM #[id number]. Topic: Access To Clinicians - Req Clinic Call Back  >> Jul 28, 2024  1:31 PM Hadassah BROCKS wrote:  [facility] has received an incoming clinical call:    Caller name:  Eva Vallee callback number: [phone]    Relationship to Patient: Self     Describe the reason for the call:  Patient would like to speak with a nurse in regards to test results , states he does not understand it on the paper work mailed to patient. Please advise

## 2024-07-29 NOTE — Unmapped (Signed)
 Patient was contacted and ultrasound results provided per letter from PCP.  No additional questions.

## 2024-08-11 ENCOUNTER — Ambulatory Visit: Admit: 2024-08-11 | Discharge: 2024-08-11 | Payer: Medicare (Managed Care)

## 2024-08-11 ENCOUNTER — Inpatient Hospital Stay: Admit: 2024-08-11 | Discharge: 2024-08-11 | Payer: Medicare (Managed Care)

## 2024-08-11 DIAGNOSIS — G40909 Epilepsy, unspecified, not intractable, without status epilepticus: Principal | ICD-10-CM

## 2024-08-11 MED ORDER — CLOBAZAM 10 MG TABLET
ORAL_TABLET | Freq: Two times a day (BID) | ORAL | 5 refills | 30.00000 days | Status: CP
Start: 2024-08-11 — End: 2025-02-07

## 2024-08-11 NOTE — Unmapped (Signed)
-   please obtain EEG (schedulers will call you)   - no changes to your seizure medications. Please continue on your current doses.

## 2024-08-11 NOTE — Unmapped (Signed)
 Outpatient Neurology Consult Note     [facility]  [address]  [address]  [place] [address]    Date: 08/11/2024  Patient Name: Ryan Davenport  MRN: [id number]  PCP: Glendia Nelwyn Sago  Pcp, None Per Patient     Assessment and Plan        Ryan Davenport is a 63 y.o. 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    Return Visit in: 3 months    I personally spent 40 minutes face-to-face and non-face-to-face in the care of this patient, which includes all pre, intra, and post visit time on the date of service.    Patient was seen and discussed with Dr.Almodovar who agrees with assessment and plan.    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    Seminology painful rhythmic myoclonic jerking, sometimes starting from the shoulder and traveling to the hands. Other times in reverse. Occasionally associated with facial tonic clonic activity. Fully consciousness during event    While prior seminology seizures has resolved with ASM (compliant on keppra  and onfi ), patient reports tremors/shaking while writing/using an utensil/driving while using his right hand. My exam did not note postural tremor or apparent action tremor while writing or holding a pen. Denies any provoking factor. Appears to only occur during action, however given that it is in the same hand as when seizures occur, will plan to obtain cvEEG to elucidate if patient is having any electrographic activities. Will not change his ASM at this time. Educated patient to not drive and to not climb stairs/swim by himself. Patient reports falling from bed recently and injuring his left foot with increased edema above baseline. Ambulating at baseline. Encourage him to see his pcp for further work up.     Plan   - Routine cvEEG  - Continue keppra  1.5 g BID   - Continue Onfi  5 mg BID  - Can consider ASM change (likely onfi  2.5 mg BID) at next visit if cvEEG is normal.   - Follow up in 3 months    Deneice Oar, MD  PGY 2 Neurology         HPI         HPI: Ryan Davenport is a 63 y.o. RH male seen in consultation at the [facility] at the request of Dr. Freddrick for evaluation of seizures.      Admitted at Reports that his right arm was shaking and was found to have focal seizures. Reports that he still has difficulty with the right hand (eating and writing). Does not always occur. Tremors occur with increasing activity. Reports wearing a glove to prevent shaking. Did not go to therapy.     Reports resolution of focal seizures (seminology painful rhythmic myoclonic jerking, sometimes starting from the shoulder and traveling to the hands. Other times in reverse, family fully consciousness).     07/08, reports having high temperature   Reports having a birth history of seizures. Does not remember the events     Reports left hand trigger finger  Reports recent fall out of bed and injuried left foot with increasing swelling.     Allergies[1]     Current Medications[2]    Past Medical History[3]    Past Surgical History[4]    Social History[5]    Family  History[6]         Objective        Vital signs: BP 135/80 (BP Site: L Arm, BP Position: Sitting, BP Cuff Size: Large)  - Pulse 79  - Temp 36.3 ??C (97.3 ??F) (Temporal)  - Resp 18  - Ht 175.3 cm (5' 9)  - Wt (!) 109.4 kg (241 lb 4 oz)  - BMI 35.63 kg/m??      Vitals:    08/11/24 1308   BP Site: L Arm   BP Position: Sitting   BP Cuff Size: Large       Physical Exam:  General Appearance:Well appearing. In no acute distress.  HEENT: Head is atraumatic and normocephalic. Sclera anicteric without injection. Oropharyngeal membranes are moist with no erythema or exudate.  Neck: Supple.  Lungs: Clear to auscultation in anterior fields. No wheezes or crackles.  Heart: Regular rate and rhythm. No murmurs, rubs, or gallops.  Abdomen: Soft, nontender, nondistended.  Extremities: No clubbing, cyanosis, or edema.    Neurological Examination:     Mental Status: Alert, conversant, able to follow conversation and interview. Spontaneous speech was fluent without word finding pauses, dysarthria, or paraphasic errors. Comprehension was intact to simple and multi-step commands. Memory for recent and remote events was intact.    Cranial Nerves: PERRL. Pursuit eye movements were uninterrupted with full range . Facial sensation intact bilaterally to light touch on the forehead, cheek, and chin. Face with baseline left mild facial droop that resolves with activation. Normal facial movement bilaterally, including forehead, eye closure and grimace/smile. Hearing intact to conversation. Shoulder shrug full strength bilaterally. Palate movement is symmetric. Tongue protrudes midline and tongue movements are normal.    Motor Exam: Normal bulk.  No tremors, myoclonus, or other adventitious movement. No tremors posturally or while performing writing task     UE R/L: deltoid 5/5, biceps 5/5, triceps 5/5, wrist flexion 5/5, wrist extension 5/5, finger extension 5/5, finger spread 5/5, and hand grip strong/strong.  LE R/L: hip flexion 5/5, quadriceps 5/5, hamstrings 5/5, dorsiflexion 5/4, and plantar flexion 5/4.      Reflexes:   R L   Biceps +1 +1   Brachioradialis +1 +1   Triceps +1 +1   Patella +2 +2   Achilles +2 +2   Toes are downgoing bilaterally.    Sensory: decrease light touch to sensation and pinprick symmetrically in bilaterally lower extremities    Cerebellar/Coordination/Gait: Gait exam demonstrates normal posture, base, stride length, arm swing and turns.         Diagnostic Studies and Review of Records   Labs:  Last CSF Analysis:   Lab Results   Component Value Date    COLORCSF Colorless 03/31/2024    COLORCSF Colorless 03/31/2024    APPEARCSF Clear 03/31/2024    APPEARCSF Clear 03/31/2024    NUCCELLSCSF 22 (H) 03/31/2024    NUCCELLSCSF 13 (H) 03/31/2024    RBCCSF 30 (H) 03/31/2024    RBCCSF <2 03/31/2024    LYMPHSCSF 85.0 03/31/2024    LYMPHSCSF 85.0 03/31/2024    MONOMACCSF 15.0 03/31/2024    MONOMACCSF 15.0 03/31/2024    PROTCSF 42 03/31/2024    GLUCCSF 94 (H) 03/31/2024     HIV Labs:   Absolute CD4 Count   Date Value Ref Range Status   03/30/2024 690 510 - 2,320 /uL Final   12/11/2023 640 510 - 2,320 /uL Final   09/15/2016 440 (L) 510 - 2,320 /uL Final   08/16/2014 546 510 - 2,320 /  uL Final   10/05/2013 368 (L) 510 - 2,320 /uL Final   03/16/2013 612 510 - 2,320 CELLS/UL Final     CD4% (T Helper)   Date Value Ref Range Status   03/30/2024 30 (L) 34 - 58 % Final   12/11/2023 40 34 - 58 % Final   09/15/2016 40 34 - 58 % Final   08/16/2014 37 34 - 58 % Final   10/05/2013 39 34 - 58 % Final   03/16/2013 39 34 - 58 % OF LYMPHS Final     HIV RNA Quant Result   Date Value Ref Range Status   07/14/2024 Detected (A) Not Detected Final   06/03/2024 Detected (A) Not Detected Final   03/30/2024 Detected (A) Not Detected Final   08/16/2014 Not Detected  Final   10/05/2013 Not Detected  Final   03/16/2013 Not Detected  Final     HIV RNA   Date Value Ref Range Status   07/14/2024 <20 (H) <0 copies/mL Final   06/03/2024 1,591 (H) <0 copies/mL Final   03/30/2024 1,369 (H) <0 copies/mL Final     HIV RNA Log10   Date Value Ref Range Status   07/14/2024   Final     Comment:     <1.30 log   06/03/2024 3.20 (H) <0.00 log copies/mL Final   03/30/2024 3.14 (H) <0.00 log copies/mL Final     Gonorrhoeae NAA   Date Value Ref Range Status   06/03/2024 Negative Negative Final   06/03/2024 Negative Negative Final   06/03/2024 Negative Negative Final       Imaging:                [1]   Allergies  Allergen Reactions    Latex Rash    Shellfish Containing Products      Other reaction(s): SHORTNESS OF BREATH    Metformin  Nausea Only     Both IR and XR formulations    Both IR and XR formulations   Both IR and XR formulations    Both IR and XR formulations      Both IR and XR formulations Both IR and XR formulations    Penicillins Rash   [2]   Current Outpatient Medications   Medication Sig Dispense Refill    betamethasone  dipropionate 0.05 % cream Apply 1 Application topically two (2) times a day. 30 g PRN    BIKTARVY  50-200-25 mg tablet Take 1 tablet by mouth daily. 90 tablet 3    blood sugar diagnostic (GLUCOSE BLOOD) Strp Use to check blood sugar as directed with insulin  3 times a day & for symptoms of high or low blood sugar. 100 strip 0    blood-glucose meter kit Use as instructed 1 each 0    cetirizine  (ZYRTEC ) 10 MG tablet Take 1 tablet (10 mg total) by mouth two (2) times a day. 60 tablet 11    cloBAZam  (ONFI ) 10 mg Tab Take 0.5 (one-half) tablet (5 mg total) by mouth two (2) times a day. 30 tablet 5    colchicine  (COLCRYS ) 0.6 mg tablet TAKE 1 TABLET BY MOUTH DAILY AS NEEDED FOR GOUT (Patient taking differently: as needed. TAKE 1 TABLET BY MOUTH DAILY AS NEEDED FOR GOUT) 90 tablet 3    cyclobenzaprine  (FLEXERIL ) 5 MG tablet Take 1 tablet (5 mg total) by mouth. (Patient taking differently: Take 1 tablet (5 mg total) by mouth as needed.)      ELIQUIS  5 mg Tab Take 1 tablet (5  mg total) by mouth two (2) times a day.      empagliflozin  (JARDIANCE ) 25 mg tablet Take 1 tablet (25 mg total) by mouth daily. 90 tablet 3    empty container Misc Use as directed to dispose for Dupixent  pens. 1 each 2    furosemide  (LASIX ) 20 MG tablet Take 1 tablet (20 mg total) by mouth daily as needed.      gabapentin  (NEURONTIN ) 100 MG capsule Take 1 capsule (100 mg total) by mouth Three (3) times a day. 90 capsule 0    hydroCHLOROthiazide 12.5 MG tablet       hydrocortisone  2.5 % ointment Apply 1 Application topically two (2) times a day. To active areas of the face until smooth 454 g 5    hydrOXYzine  (ATARAX ) 25 MG tablet Take 1 tablet (25 mg total) by mouth every six (6) hours as needed.      lancets Misc Use to check blood sugar as directed with insulin  3 times a day & for symptoms of high or low blood sugar. 100 each 0    levETIRAcetam  (KEPPRA ) 750 MG tablet Take 2 tablets (1,500 mg total) by mouth two (2) times a day. 120 tablet 5    lisinopril  (PRINIVIL ,ZESTRIL ) 10 MG tablet Take 1 tablet (10 mg total) by mouth daily. 30 tablet 0    methocarbamoL  (ROBAXIN ) 750 MG tablet 750mg  4x daily prn muscle spasms (Patient taking differently: as needed. 750mg  4x daily prn muscle spasms) 60 tablet 2    metoPROLOL  succinate (TOPROL -XL) 25 MG 24 hr tablet Take 0.5 tablets (12.5 mg total) by mouth daily.      omeprazole  (PRILOSEC) 40 MG capsule 40mg  BID 180 capsule 3    pen needle, diabetic 32 gauge x 5/32 (4 mm) Ndle Use with insulin  up to 4 times/day as needed. 1 each 0    sertraline  (ZOLOFT ) 50 MG tablet Take 1 tablet (50 mg total) by mouth daily. 30 tablet 0    triamcinolone  (KENALOG ) 0.1 % ointment Apply topically two (2) times a day. To active areas of trunk and extremities until smooth 454 g 5    triamcinolone  (KENALOG ) 0.1 % ointment Apply the medication twice daily to affected areas of the skin until smooth. Then stop and re-start as soon as the skin changes come back. 454 g 10    atorvastatin  (LIPITOR ) 40 MG tablet Take 1 tablet (40 mg total) by mouth daily. (Patient not taking: Reported on 08/11/2024) 90 tablet 3    dupilumab  (DUPIXENT  SYRINGE) 300 mg/2 mL syringe Inject the contents of 1 syringe (300 mg total) under the skin every fourteen (14) days. For maintenance dose (Patient not taking: Reported on 08/11/2024) 4 mL 11    KLOR-CON  M15 15 mEq tablet  (Patient not taking: Reported on 08/11/2024)      [Paused] semaglutide  (RYBELSUS ) 14 mg Tab Take 14 mg by mouth daily. (Patient not taking: Reported on 08/11/2024) 30 tablet 11     No current facility-administered medications for this visit.   [3]   Past Medical History:  Diagnosis Date    Asthma (HHS-HCC)     Asymptomatic human immunodeficiency virus (HIV) infection status     04/09/2013    Diagnosed in 2005 with a CD4 of 32 and on at least one prior regimen on a study, then switched to Truvada with ritonavir boosted atazanavir. Changed to Truvada with boosted darunavir secondary to reflux symptoms. Has remained undetectable for several years with CD4 count greater than 500.  CAD (coronary artery disease)     Depression     Diabetes mellitus         DVT (deep venous thrombosis)         Eczema     Enlargement of liver     GERD (gastroesophageal reflux disease)     HIV (human immunodeficiency virus infection)         Hyperlipidemia     Hypertension     Myocardial infarction         Obesity     PICC (peripherally inserted central catheter) removal 04/21/2021    Pulmonary embolism     2012    Sleep apnea    [4]   Past Surgical History:  Procedure Laterality Date    PR COLONOSCOPY FLX DX W/COLLJ SPEC WHEN PFRMD N/A 11/11/2013    Procedure: COLONOSCOPY, FLEXIBLE, PROXIMAL TO SPLENIC FLEXURE; DIAGNOSTIC, W/WO COLLECTION SPECIMEN BY BRUSH OR WASH;  Surgeon: Norwood Hose, MD;  [facility];  Service: Gastroenterology    PR UPPER GI ENDOSCOPY,DIAGNOSIS N/A 04/01/2019    Procedure: UGI ENDO, INCLUDE ESOPHAGUS, STOMACH, & DUODENUM &/OR JEJUNUM; DX W/WO COLLECTION SPECIMN, BY BRUSH OR WASH;  Surgeon: Lorrene Georgi Rase, MD;  Location: [facility];  Service: Gastroenterology    SKIN BIOPSY     [5]   Social History  Socioeconomic History    Marital status: Single     Spouse name: None    Number of children: None    Years of education: None    Highest education level: None   Tobacco Use    Smoking status: Never     Passive exposure: Never    Smokeless tobacco: Never   Vaping Use    Vaping status: Never Used   Substance and Sexual Activity    Alcohol  use: No     Alcohol /week: 0.0 standard drinks of alcohol     Drug use: No    Sexual activity: Not Currently     Partners: Male   Other Topics Concern    Do you use sunscreen? No    Tanning bed use? No    Are you easily burned? No    Excessive sun exposure? No    Blistering sunburns? No   Social History Narrative    Single.    No children    Works 2 days per week.  [employer] in [employer]     Social Drivers of Health     Financial Resource Strain: Low Risk  (03/31/2024)    Overall Financial Resource Strain (CARDIA)     Difficulty of Paying Living Expenses: Not hard at all   Food Insecurity: No Food Insecurity (03/27/2024)    Received from [facility]    Hunger Vital Sign     Within the past 12 months, you worried that your food would run out before you got the money to buy more.: Never true     Within the past 12 months, the food you bought just didn't last and you didn't have money to get more.: Never true   Transportation Needs: Unknown (03/27/2024)    Received from [facility] - Transportation     Lack of Transportation (Medical): No     Lack of Transportation (Non-Medical): Patient unable to answer   Social Connections: Unknown (03/27/2024)    Received from [facility]    Social Connection and Isolation Panel     In a typical week, how many times do  you talk on the phone with family, friends, or neighbors?: Patient declined     How often do you get together with friends or relatives?: More than three times a week     How often do you attend church or religious services?: 1 to 4 times per year     Do you belong to any clubs or organizations such as church groups, unions, fraternal or athletic groups, or school groups?: Patient declined     How often do you attend meetings of the clubs or organizations you belong to?: Patient declined     Are you married, widowed, divorced, separated, never married, or living with a partner?: Never married   Housing: Low Risk  (03/09/2024)    Housing     Within the past 12 months, have you ever stayed: outside, in a car, in a tent, in an overnight shelter, or temporarily in someone else's home (i.e. couch-surfing)?: No     Are you worried about losing your housing?: No   [6]   Family History  Problem Relation Age of Onset    Diabetes Mother Heart disease Mother     Hypertension Mother     Arthritis Mother     Aneurysm Father     Skin cancer Neg Hx     Melanoma Neg Hx     Basal cell carcinoma Neg Hx     Squamous cell carcinoma Neg Hx

## 2024-08-14 NOTE — Unmapped (Signed)
 Ryan Davenport reports his Dupixent  treatment is going well - he was able to inject syringes with no issue and prefers these to pens. He's seen some improvements in itching and rash already.       [facility] Clinical Assessment & Refill Coordination Note    Ryan Davenport, DOB: 1961/02/16  Phone: [phone] (home)     All above HIPAA information was verified with patient.     Was a Nurse, learning disability used for this call? No    Specialty Medication(s):   Inflammatory Disorders: Dupixent      Current Medications[1]     Changes to medications: Ryan Davenport reports no changes at this time.    Medication list has been reviewed and updated in Epic: Yes    Allergies[2]    Changes to allergies: No    Allergies have been reviewed and updated in Epic: Yes    SPECIALTY MEDICATION ADHERENCE     Dupixent  300 mg: 0 doses of medicine on hand   Medication Adherence    Patient reported X missed doses in the last month: 0  Specialty Medication: Dupixent           Specialty medication(s) dose(s) confirmed: Regimen is correct and unchanged.     Are there any concerns with adherence? No    Adherence counseling provided? Not needed    CLINICAL MANAGEMENT AND INTERVENTION      Clinical Benefit Assessment:    Do you feel the medicine is effective or helping your condition? Yes    Clinical Benefit counseling provided? Not needed    Adverse Effects Assessment:    Are you experiencing any side effects? No    Are you experiencing difficulty administering your medicine? No    Quality of Life Assessment:    Quality of Life    Rheumatology  Oncology  Dermatology  1. What impact has your specialty medication had on the symptoms of your skin condition (i.e. itchiness, soreness, stinging)?: Some  2. What impact has your specialty medication had on your comfort level with your skin?: Some  Cystic Fibrosis          How many days over the past month did your AD  keep you from your normal activities? For example, brushing your teeth or getting up in the morning. Patient declined to answer    Have you discussed this with your provider? Not needed    Acute Infection Status:    Acute infections noted within Epic:  No active infections    Patient reported infection: None    Therapy Appropriateness:    Is therapy appropriate based on current medication list, adverse reactions, adherence, clinical benefit and progress toward achieving therapeutic goals? Yes, therapy is appropriate and should be continued     Clinical Intervention:    Was an intervention completed as part of this clinical assessment? No    DISEASE/MEDICATION-SPECIFIC INFORMATION      For patients on injectable medications: Next injection is scheduled for asap - patient reports last dose was ~ 14 days ago. Will receive on 8/26.    Chronic Inflammatory Diseases: Have you experienced any flares in the last month? No  Has this been reported to your provider? No    PATIENT SPECIFIC NEEDS     Does the patient have any physical, cognitive, or cultural barriers? No    Is the patient high risk? No    Does the patient require physician intervention or other additional services (i.e., nutrition, smoking cessation, social work)? No  Does the patient have an additional or emergency contact listed in their chart? Yes    SOCIAL DETERMINANTS OF HEALTH     At the [facility], we have learned that life circumstances - like trouble affording food, housing, utilities, or transportation can affect the health of many of our patients.   That is why we wanted to ask: are you currently experiencing any life circumstances that are negatively impacting your health and/or quality of life? Patient declined to answer    Social Drivers of Health     Food Insecurity: No Food Insecurity (03/27/2024)    Received from [facility]    Hunger Vital Sign     Within the past 12 months, you worried that your food would run out before you got the money to buy more.: Never true     Within the past 12 months, the food you bought just didn't last and you didn't have money to get more.: Never true   Tobacco Use: Low Risk  (08/11/2024)    Patient History     Smoking Tobacco Use: Never     Smokeless Tobacco Use: Never     Passive Exposure: Never   Transportation Needs: Unknown (03/27/2024)    Received from [facility] - Transportation     Lack of Transportation (Medical): No     Lack of Transportation (Non-Medical): Patient unable to answer   Alcohol  Use: Not At Risk (07/31/2023)    Alcohol  Use     How often do you have a drink containing alcohol ?: Never     How many drinks containing alcohol  do you have on a typical day when you are drinking?: 1 - 2     How often do you have 5 or more drinks on one occasion?: Never   Housing: Low Risk  (03/09/2024)    Housing     Within the past 12 months, have you ever stayed: outside, in a car, in a tent, in an overnight shelter, or temporarily in someone else's home (i.e. couch-surfing)?: No     Are you worried about losing your housing?: No   Physical Activity: Not on file   Utilities: Not At Risk (03/27/2024)    Received from [facility]     Threatened with loss of utilities: No   Stress: Not on file   Interpersonal Safety: Not At Risk (07/22/2024)    Interpersonal Safety     Unsafe Where You Currently Live: No     Physically Hurt by Anyone: No     Abused by Anyone: No   Substance Use: Not on file (07/31/2024)   Intimate Partner Violence: Not At Risk (07/22/2024)    Humiliation, Afraid, Rape, and Kick questionnaire     Fear of Current or Ex-Partner: No     Emotionally Abused: No     Physically Abused: No     Sexually Abused: No   Social Connections: Unknown (03/27/2024)    Received from [facility]    Social Connection and Isolation Panel     In a typical week, how many times do you talk on the phone with family, friends, or neighbors?: Patient declined     How often do you get together with friends or relatives?: More than three times a week     How often do you attend church or religious services?: 1 to 4 times per year     Do you belong to any clubs or  organizations such as church groups, unions, fraternal or athletic groups, or school groups?: Patient declined     How often do you attend meetings of the clubs or organizations you belong to?: Patient declined     Are you married, widowed, divorced, separated, never married, or living with a partner?: Never married   Physicist, medical Strain: Low Risk  (03/31/2024)    Overall Financial Resource Strain (CARDIA)     Difficulty of Paying Living Expenses: Not hard at all   Health Literacy: Not on file   Internet Connectivity: Not on file       Would you be willing to receive help with any of the needs that you have identified today? Not applicable       SHIPPING     Specialty Medication(s) to be Shipped:   Inflammatory Disorders: Dupixent     Other medication(s) to be shipped: No additional medications requested for fill at this time    Specialty Medications not needed at this time: N/A     Changes to insurance: No    Cost and Payment: Patient has a $0 copay, payment information is not required.    Delivery Scheduled: Yes, Expected medication delivery date: 8/26.     Medication will be delivered via Same Day Courier to the confirmed prescription address in [facility].    The patient will receive a drug information handout for each medication shipped and additional FDA Medication Guides as required.  Verified that patient has previously received a Conservation officer, historic buildings and a Surveyor, mining.    The patient or caregiver noted above participated in the development of this care plan and knows that they can request review of or adjustments to the care plan at any time.      All of the patient's questions and concerns have been addressed.    Wei Newbrough [facility] Specialty Pharmacist         [1]   Current Outpatient Medications   Medication Sig Dispense Refill    atorvastatin  (LIPITOR ) 40 MG tablet Take 1 tablet (40 mg total) by mouth daily. (Patient not taking: Reported on 08/11/2024) 90 tablet 3    betamethasone  dipropionate 0.05 % cream Apply 1 Application topically two (2) times a day. 30 g PRN    BIKTARVY  50-200-25 mg tablet Take 1 tablet by mouth daily. 90 tablet 3    blood sugar diagnostic (GLUCOSE BLOOD) Strp Use to check blood sugar as directed with insulin  3 times a day & for symptoms of high or low blood sugar. 100 strip 0    blood-glucose meter kit Use as instructed 1 each 0    cetirizine  (ZYRTEC ) 10 MG tablet Take 1 tablet (10 mg total) by mouth two (2) times a day. 60 tablet 11    cloBAZam  (ONFI ) 10 mg Tab Take 0.5 (one-half) tablet (5 mg total) by mouth two (2) times a day. 30 tablet 5    colchicine  (COLCRYS ) 0.6 mg tablet TAKE 1 TABLET BY MOUTH DAILY AS NEEDED FOR GOUT (Patient taking differently: as needed. TAKE 1 TABLET BY MOUTH DAILY AS NEEDED FOR GOUT) 90 tablet 3    cyclobenzaprine  (FLEXERIL ) 5 MG tablet Take 1 tablet (5 mg total) by mouth. (Patient taking differently: Take 1 tablet (5 mg total) by mouth as needed.)      dupilumab  (DUPIXENT  SYRINGE) 300 mg/2 mL syringe Inject the contents of 1 syringe (300 mg total) under the skin every fourteen (14) days.  For maintenance dose (Patient not taking: Reported on 08/11/2024) 4 mL 11    ELIQUIS  5 mg Tab Take 1 tablet (5 mg total) by mouth two (2) times a day.      empagliflozin  (JARDIANCE ) 25 mg tablet Take 1 tablet (25 mg total) by mouth daily. 90 tablet 3    empty container Misc Use as directed to dispose for Dupixent  pens. 1 each 2    furosemide  (LASIX ) 20 MG tablet Take 1 tablet (20 mg total) by mouth daily as needed.      gabapentin  (NEURONTIN ) 100 MG capsule Take 1 capsule (100 mg total) by mouth Three (3) times a day. 90 capsule 0    hydroCHLOROthiazide 12.5 MG tablet       hydrocortisone  2.5 % ointment Apply 1 Application topically two (2) times a day. To active areas of the face until smooth 454 g 5    hydrOXYzine  (ATARAX ) 25 MG tablet Take 1 tablet (25 mg total) by mouth every six (6) hours as needed.      KLOR-CON  M15 15 mEq tablet  (Patient not taking: Reported on 08/11/2024)      lancets Misc Use to check blood sugar as directed with insulin  3 times a day & for symptoms of high or low blood sugar. 100 each 0    levETIRAcetam  (KEPPRA ) 750 MG tablet Take 2 tablets (1,500 mg total) by mouth two (2) times a day. 120 tablet 5    lisinopril  (PRINIVIL ,ZESTRIL ) 10 MG tablet Take 1 tablet (10 mg total) by mouth daily. 30 tablet 0    methocarbamoL  (ROBAXIN ) 750 MG tablet 750mg  4x daily prn muscle spasms (Patient taking differently: as needed. 750mg  4x daily prn muscle spasms) 60 tablet 2    metoPROLOL  succinate (TOPROL -XL) 25 MG 24 hr tablet Take 0.5 tablets (12.5 mg total) by mouth daily.      omeprazole  (PRILOSEC) 40 MG capsule 40mg  BID 180 capsule 3    pen needle, diabetic 32 gauge x 5/32 (4 mm) Ndle Use with insulin  up to 4 times/day as needed. 1 each 0    [Paused] semaglutide  (RYBELSUS ) 14 mg Tab Take 14 mg by mouth daily. (Patient not taking: Reported on 08/11/2024) 30 tablet 11    sertraline  (ZOLOFT ) 50 MG tablet Take 1 tablet (50 mg total) by mouth daily. 30 tablet 0    triamcinolone  (KENALOG ) 0.1 % ointment Apply topically two (2) times a day. To active areas of trunk and extremities until smooth 454 g 5    triamcinolone  (KENALOG ) 0.1 % ointment Apply the medication twice daily to affected areas of the skin until smooth. Then stop and re-start as soon as the skin changes come back. 454 g 10     No current facility-administered medications for this visit.   [2]   Allergies  Allergen Reactions    Latex Rash    Shellfish Containing Products      Other reaction(s): SHORTNESS OF BREATH    Metformin  Nausea Only     Both IR and XR formulations    Both IR and XR formulations   Both IR and XR formulations    Both IR and XR formulations      Both IR and XR formulations Both IR and XR formulations    Penicillins Rash

## 2024-08-17 ENCOUNTER — Inpatient Hospital Stay: Admit: 2024-08-17 | Discharge: 2024-08-18 | Payer: Medicare (Managed Care)

## 2024-08-18 MED FILL — DUPIXENT 300 MG/2 ML SUBCUTANEOUS SYRINGE: SUBCUTANEOUS | 28 days supply | Qty: 4 | Fill #1

## 2024-08-18 NOTE — Unmapped (Addendum)
 [place] EEG REPORT: ROUTINE    Patient: Ryan [facility]   Date of Birth: [birth year]   [place] MRN: [id number]  Ordering Provider:  Sula Ford Almodovar Su*     Study Information  Date of Study: 08/17/2024  EEG Start: 14:17:30  EEG End: 14:43:12    HISTORY: Per chart, [name] is 63 y.o. 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  The study was recorded digitally with a bandpass of 1-70Hz  and a sampling rate of 200Hz  and was reviewed with the possibility of multiple reformatting.    EEG DESCRIPTION       Background:   The waking background showed normal organization.  There was a defined posterior dominant rhythm of 9-10 Hertz,10-20 microvolt which was symmetrical and showed normal reactivity. The background was admixed with excessive, diffuse low amplitude 15-25 Hz beta activity and a small amount of 4-7 Hz theta.            Sleep:  Attenuation of the occipital rhythm accompanied drowsiness.  The sleep background was appropriately organized with well-developed K complex, spindles ,Positive occipital sharp transients and vertex waves.  These sleep transients showed appropriate morphology and are bilaterally synchronous and symmetrical.    Focal Features:  There were no focal slowing or interhemispheric asymmetries.     Epileptiform Activity:  There were no epileptiform abnormalities    Activating Procedures:    Hyperventilation was not performed.   Photic stimulation was unremarkable.    Ictal Activity:  There were no ictal patterns noted.    Clinical Events:  There were no clinical events.    Single channel EKG:  regular rhythm and normal rate      EEG SUMMARY   This is a normal awake and asleep interictal EEG.    CLINICAL CORRELATION   This routine EEG is normal. There were no seizures or epileptiform activity.     Absence of epileptiform discharges on a routine EEG does not preclude a clinical diagnosis of epilepsy or seizures.  and The excessive beta activity is a non-specific finding; e.g. it can be secondary to medication effect or excessive cortical excitability.    Interpreting Physician:   Maryfrances Nora, MD   08/18/2024

## 2024-08-19 DIAGNOSIS — B20 Human immunodeficiency virus [HIV] disease: Principal | ICD-10-CM

## 2024-08-19 MED ORDER — CABOTEGRAVIR ER 600 MG/3 ML-RILPIVIRINE ER 900 MG/3ML IM SUSPENSION,ER
INTRAMUSCULAR | 6 refills | 56.00000 days | Status: CP
Start: 2024-08-19 — End: ?
  Filled 2024-08-26: qty 6, 30d supply, fill #0

## 2024-08-20 NOTE — Unmapped (Signed)
 [facility] Specialty Medication Onboarding    Specialty Medication: CABENUVA 600 mg/3 mL- 900 mg/3 mL extended-release injection (cabotegravir-rilpivirine)  Prior Authorization: Not Required   Financial Assistance: No - copay  <$25  Final Copay/Day Supply: $0 / 30    Insurance Restrictions: None     Notes to Pharmacist:   Credit Card on File: no  Start Date on Rx:      The triage team has completed the benefits investigation and has determined that the patient is able to fill this medication at [facility]. Please contact the patient to complete the onboarding or follow up with the prescribing physician as needed.

## 2024-08-21 NOTE — Unmapped (Signed)
 This patient is receiving Cabenuva  as a clinic administered medication. Pharmacist reviewed the prescription and the patient's chart and determined that therapy is appropriate.  Patient is: not contacted as copay is $0. All future communication to occur between the [facility] and the clinic. This patient will be added to a patient list for appropriate follow up.        [facility] Clinic Administered Medication Refill Coordination Note      NAME:Ryan [facility] DOB: Feb 17, 1961      Medication: Cabenuva     Day Supply: 30      SHIPPING      Next delivery from [facility] [phone]) to [facility] for Ryan [facility] is scheduled for 9/4.    Clinic contact: Clydie Bihari, RN    Patient's next nurse visit for administration: TBD.    We will follow up with clinic monthly for standard refill processing and delivery.      Chairty Toman, PharmD  [facility] Specialty Pharmacist

## 2024-08-26 ENCOUNTER — Emergency Department
Admit: 2024-08-26 | Discharge: 2024-08-26 | Disposition: A | Discharge status: Home / Self Care | Payer: Medicare (Managed Care) | Attending: Family Medicine

## 2024-08-26 DIAGNOSIS — M25532 Pain in left wrist: Principal | ICD-10-CM
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
## 2024-08-27 NOTE — Unmapped (Signed)
 Called Ryan Davenport to see when he might like to get started on Cabenuva . No answer, left detailed message with my direct number for call back.

## 2024-08-27 NOTE — Unmapped (Signed)
 Received message from ED provider with reported patient nonadherence with Eliquis .  He has had multiple VTE and should be on anticoagulation indefinitely.  Attempted to contact patient unsuccessfully times 29/4/25, phone number for clinic provided.

## 2024-09-03 ENCOUNTER — Telehealth: Payer: Self-pay | Admitting: Podiatry

## 2024-09-03 NOTE — Telephone Encounter (Signed)
 Called to confirm appointment cancellation- unable to leave message as mailbox was full/not set up

## 2024-09-08 ENCOUNTER — Ambulatory Visit: Admitting: Podiatry

## 2024-09-09 NOTE — Unmapped (Signed)
 Tried to get in touch with Ryan Davenport to see when he would like to start Cabenuva . No answer, VM is full. Left SMS number for return call.   MyChart is not set up.

## 2024-09-11 NOTE — Unmapped (Signed)
 The [facility] has made a second and final attempt to reach this patient to refill the following medication:dupilumab : DUPIXENT  SYRINGE 300 mg/2 mL syringe.      We have left voicemails on the following phone numbers: [phone] and have sent a text message to the following phone numbers: [phone].    Dates contacted: 09/03/2024 and 09/11/2024  Last scheduled delivery: 08/18/2024    The patient may be at risk of non-compliance with this medication. The patient should call the [facility] at [phone]  Option 4, then Option 2: Dermatology, Gastroenterology, Rheumatology to refill medication.    Lucie HERO Makayla Confer   [facility]ologist

## 2024-09-15 ENCOUNTER — Inpatient Hospital Stay: Admit: 2024-09-15 | Discharge: 2024-09-16 | Payer: Medicare (Managed Care)

## 2024-09-15 DIAGNOSIS — Z794 Long term (current) use of insulin: Principal | ICD-10-CM

## 2024-09-15 DIAGNOSIS — M25532 Pain in left wrist: Principal | ICD-10-CM

## 2024-09-15 DIAGNOSIS — E1140 Type 2 diabetes mellitus with diabetic neuropathy, unspecified: Principal | ICD-10-CM

## 2024-09-15 DIAGNOSIS — R590 Localized enlarged lymph nodes: Principal | ICD-10-CM

## 2024-09-15 MED ORDER — ELIQUIS 5 MG TABLET
ORAL_TABLET | Freq: Two times a day (BID) | ORAL | PRN refills | 30.00000 days | Status: CP
Start: 2024-09-15 — End: ?
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
## 2024-09-15 NOTE — Unmapped (Addendum)
 Please restart Eliquis  5mg  twice daily for blood clot prevention  Ultrasound on neck ordered for comparison  Left wrist xray today  Stay off of Rybelsus 
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
## 2024-09-25 NOTE — Unmapped (Signed)
 Ryan Davenport (Clinic POC) has been contacted in regards to a refill of cabenuva  for Ryan Davenport. At this time, the clinic has declined refill due to patient has not started therapy yet. Refill assessment call date has been updated per the clinic's request.

## 2024-09-29 NOTE — Unmapped (Signed)
 The [facility] has made a second and final attempt to reach this patient to refill the following medication:DUPIXENT  SYRINGE 300 mg/2 mL syringe (dupilumab ).      We have left voicemails on the following phone numbers: [phone], have been unable to leave messages on the following phone numbers: [phone], and have sent a text message to the following phone numbers: [phone].    Dates contacted: 09/26/24-09/29/24  Last scheduled delivery: 08/18/24    The patient may be at risk of non-compliance with this medication. The patient should call the [facility] at [phone]  Option 4, then Option 2: Dermatology, Gastroenterology, Rheumatology to refill medication.    Dena LOISE Bonner [facility] Oncologist

## 2024-09-30 NOTE — Unmapped (Signed)
 Attempted to contact patient to schedule dose #1 of Cabenuva .  Automated voicemail message states that the person at this number cannot accept calls at this time.  Unable to leave message.

## 2024-10-12 ENCOUNTER — Ambulatory Visit (INDEPENDENT_AMBULATORY_CARE_PROVIDER_SITE_OTHER): Admitting: Podiatry

## 2024-10-12 VITALS — Ht 72.0 in | Wt 225.0 lb

## 2024-10-12 DIAGNOSIS — B070 Plantar wart: Secondary | ICD-10-CM | POA: Diagnosis not present

## 2024-10-12 DIAGNOSIS — B351 Tinea unguium: Secondary | ICD-10-CM

## 2024-10-12 DIAGNOSIS — M79674 Pain in right toe(s): Secondary | ICD-10-CM | POA: Diagnosis not present

## 2024-10-12 DIAGNOSIS — M79675 Pain in left toe(s): Secondary | ICD-10-CM

## 2024-10-12 NOTE — Progress Notes (Signed)
  Subjective:  Patient ID: Craig [name], male    DOB: 1961/05/19,  MRN: [id number]  Chief Complaint  Patient presents with   Diabetes    Rm 10  DFC and callus on the left heel.      63 y.o. male presents with the above complaint. History confirmed with patient.     Nails are thickened and elongated and bothering him.  Has a new issue of a painful lesion on the left heel as well  Objective:  Physical Exam: warm, good capillary refill, no trophic changes or ulcerative lesions, normal DP and PT pulses, normal sensory exam.  Nails are thickened elongated yellow and discolored with subungual debris.  There is a small verruca plantaris plantar lateral left heel with pain with lateral compression   Assessment:   1. Pain due to onychomycosis of toenails of both feet   2. Verruca plantaris       Plan:  Patient was evaluated and treated and all questions answered.    Discussed the etiology and treatment options for the condition in detail with the patient. Recommended debridement of the nails today. Sharp and mechanical debridement performed of all painful and mycotic nails today. Nails debrided in length and thickness using a nail nipper to level of comfort. Discussed treatment options including appropriate shoe gear. Follow up as needed for painful nails.  He had a new issue today of a painful verruca plantaris on the plantar left heel.  The lesion was debrided following consent sharply with a scalpel to expose and enucleated the central lesion.  Salicylic acid treatment was applied to destroy it and was dressed with a bandage.  Advised to leave on overnight and then remove and wash with soap and water..  Discussed using this at home OTC if does not improve in 1 to 2 weeks.  Follow-up sooner if needed for this as well.  Return in about 3 months (around 01/12/2025) for at risk diabetic foot care.

## 2024-10-14 DIAGNOSIS — Z794 Long term (current) use of insulin: Principal | ICD-10-CM

## 2024-10-14 DIAGNOSIS — L209 Atopic dermatitis, unspecified: Principal | ICD-10-CM

## 2024-10-14 DIAGNOSIS — E1140 Type 2 diabetes mellitus with diabetic neuropathy, unspecified: Principal | ICD-10-CM

## 2024-10-14 DIAGNOSIS — I10 Essential (primary) hypertension: Principal | ICD-10-CM

## 2024-10-14 DIAGNOSIS — M25532 Pain in left wrist: Principal | ICD-10-CM

## 2024-10-14 DIAGNOSIS — R590 Localized enlarged lymph nodes: Principal | ICD-10-CM

## 2024-10-14 MED ORDER — CICLOPIROX 8 % TOPICAL SOLUTION
Freq: Every evening | TOPICAL | 5 refills | 0.00000 days | Status: CP
Start: 2024-10-14 — End: 2025-01-12
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
## 2024-10-14 NOTE — Unmapped (Signed)
 [facility] at [facility]     Reason for visit: Follow up    Questions / Concerns that need to be addressed: diabetes follow up    Screening BP- 120/71   96        PTHomeBP     Diabetes:  Regularly checking blood sugars?: no  If yes, when? Complete log for past 7 days  Date Before Breakfast After Breakfast Before Lunch After Lunch Before Dinner After Dinner Before Bed                                                                                                                                     Dexcom or Libre CGM in use? If so, pull appropriate reporting through portal (Dexcom) or EPIC order [name].    HCDM reviewed and updated in Epic:    We are working to make sure all of our patients??? wishes are updated in Epic and part of that is documenting a Environmental health practitioner for each patient  A Health Care Decision Maker is someone you choose who can make health care decisions for you if you are not able - who would you most want to do this for you????  is already up to date.    HCDM (patient stated preference): Ryan Davenport, Ryan Davenport - Mother - [phone]    BPAs completed:  Influenza vaccine    Annual Screenings:     __________________________________________________________________________________________    SCREENINGS COMPLETED IN FLOWSHEETS      AUDIT       PHQ2       PHQ9          GAD7       COPD Assessment       Falls Risk

## 2024-10-14 NOTE — Unmapped (Signed)
 Addended by: GLENDIA METRO B on: 10/14/2024 02:35 PM     Modules accepted: Orders

## 2024-10-19 ENCOUNTER — Inpatient Hospital Stay: Admit: 2024-10-19 | Discharge: 2024-10-19 | Payer: Medicare (Managed Care)

## 2024-10-26 NOTE — Progress Notes (Signed)
 Ryan Davenport and Ryan Davenport (Clinic POC) has been contacted in regards to a refill of Cabenuva  for [name]. At this time, the clinic has declined refill due to patient has not started therapy. Refill assessment call date has been updated per the clinic's request.    Note: Clinic has attempted to reach the patient multiple times. Clinic will follow up with [facility] if a refill is needed.

## 2024-10-27 ENCOUNTER — Inpatient Hospital Stay: Admit: 2024-10-27 | Discharge: 2024-10-27 | Payer: Medicare (Managed Care)

## 2024-10-27 ENCOUNTER — Ambulatory Visit: Admit: 2024-10-27 | Discharge: 2024-10-27 | Payer: Medicare (Managed Care)
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
## 2024-10-28 ENCOUNTER — Encounter: Admit: 2024-10-28 | Discharge: 2024-10-29 | Payer: Medicare (Managed Care)

## 2024-10-28 ENCOUNTER — Ambulatory Visit: Admit: 2024-10-28 | Discharge: 2024-10-29 | Payer: Medicare (Managed Care)

## 2024-10-28 DIAGNOSIS — Z113 Encounter for screening for infections with a predominantly sexual mode of transmission: Principal | ICD-10-CM

## 2024-10-28 DIAGNOSIS — Z1159 Encounter for screening for other viral diseases: Principal | ICD-10-CM

## 2024-10-28 DIAGNOSIS — Z5181 Encounter for therapeutic drug level monitoring: Principal | ICD-10-CM

## 2024-10-28 DIAGNOSIS — B20 Human immunodeficiency virus [HIV] disease: Principal | ICD-10-CM

## 2024-10-28 DIAGNOSIS — Z23 Encounter for immunization: Principal | ICD-10-CM

## 2024-10-28 DIAGNOSIS — Z9189 Other specified personal risk factors, not elsewhere classified: Principal | ICD-10-CM

## 2024-10-28 DIAGNOSIS — Z1210 Encounter for screening for malignant neoplasm of intestinal tract, unspecified: Principal | ICD-10-CM

## 2024-10-28 DIAGNOSIS — Z79899 Other long term (current) drug therapy: Principal | ICD-10-CM

## 2024-10-28 DIAGNOSIS — Z1151 Encounter for screening for human papillomavirus (HPV): Principal | ICD-10-CM

## 2024-10-28 LAB — COMPREHENSIVE METABOLIC PANEL
ALBUMIN: 3.5 g/dL (ref 3.4–5.0)
ALKALINE PHOSPHATASE: 81 U/L (ref 46–116)
ALT (SGPT): 13 U/L (ref 10–49)
ANION GAP: 9 mmol/L (ref 5–14)
AST (SGOT): 25 U/L (ref <=34)
BILIRUBIN TOTAL: <0.2 mg/dL — ABNORMAL LOW (ref 0.3–1.2)
BLOOD UREA NITROGEN: 7 mg/dL — ABNORMAL LOW (ref 9–23)
BUN / CREAT RATIO: 5
CALCIUM: 9.1 mg/dL (ref 8.7–10.4)
CHLORIDE: 106 mmol/L (ref 98–107)
CO2: 25.9 mmol/L (ref 20.0–31.0)
CREATININE: 1.52 mg/dL — ABNORMAL HIGH (ref 0.73–1.18)
EGFR CKD-EPI (2021) MALE: 52 mL/min/1.73m2 — ABNORMAL LOW (ref >=60)
GLUCOSE RANDOM: 98 mg/dL (ref 70–179)
POTASSIUM: 3.9 mmol/L (ref 3.4–4.8)
PROTEIN TOTAL: 9.9 g/dL — ABNORMAL HIGH (ref 5.7–8.2)
SODIUM: 141 mmol/L (ref 135–145)

## 2024-10-28 LAB — CBC W/ AUTO DIFF
BASOPHILS ABSOLUTE COUNT: 0 10*9/L (ref 0.0–0.1)
BASOPHILS RELATIVE PERCENT: 0.7 %
EOSINOPHILS ABSOLUTE COUNT: 0.2 10*9/L (ref 0.0–0.5)
EOSINOPHILS RELATIVE PERCENT: 4.9 %
HEMATOCRIT: 38.4 % — ABNORMAL LOW (ref 39.0–48.0)
HEMOGLOBIN: 12.7 g/dL — ABNORMAL LOW (ref 12.9–16.5)
LYMPHOCYTES ABSOLUTE COUNT: 1.2 10*9/L (ref 1.1–3.6)
LYMPHOCYTES RELATIVE PERCENT: 27.4 %
MEAN CORPUSCULAR HEMOGLOBIN CONC: 33.1 g/dL (ref 32.0–36.0)
MEAN CORPUSCULAR HEMOGLOBIN: 28 pg (ref 25.9–32.4)
MEAN CORPUSCULAR VOLUME: 84.6 fL (ref 77.6–95.7)
MEAN PLATELET VOLUME: 7.6 fL (ref 6.8–10.7)
MONOCYTES ABSOLUTE COUNT: 0.5 10*9/L (ref 0.3–0.8)
MONOCYTES RELATIVE PERCENT: 10.3 %
NEUTROPHILS ABSOLUTE COUNT: 2.6 10*9/L (ref 1.8–7.8)
NEUTROPHILS RELATIVE PERCENT: 56.7 %
PLATELET COUNT: 287 10*9/L (ref 150–450)
RED BLOOD CELL COUNT: 4.53 10*12/L (ref 4.26–5.60)
RED CELL DISTRIBUTION WIDTH: 15.9 % — ABNORMAL HIGH (ref 12.2–15.2)
WBC ADJUSTED: 4.5 10*9/L (ref 3.6–11.2)

## 2024-10-28 MED ADMIN — cabotegravir-rilpivirine (CABENUVA) 600 mg-900 mg/3 mL extended-release injection 6 mL: 6 mL | INTRAMUSCULAR | @ 20:00:00 | Stop: 2024-10-28

## 2024-10-28 NOTE — Progress Notes (Deleted)
 Duration of Intervention: ~60 minutes   Type of Contact: in-person   Reason for Contact: Initial Cabenuva  injection for treatment of HIV-1.   Assessment: Patient presents for first Cabenuva  injection. Recent labs reviewed and support safe initiation. Patient previously completed counseling on treatment expectations, adherence, and injection protocol. Patient education sheet was reviewed again today. Patient voiced understanding of treatment plan, expected side effects, and need for timely follow-up. PHQ-9 screening completed. PHQ-9 score:6. No concerns identified that would delay initiation. Scores of 9 or above will require meeting with Social Work before leaving.  Adherence: Patient verbalized understanding of importance of adherence to Cabenuva  injection schedule. Explained the ??7-day scheduling window for future injections. Patient acknowledged and confirmed understanding of this requirement.   Intervention:   Administered first injection per protocol. 21G 2    Patient monitored post-injection for adverse reactions.   Patient tolerated injections well.   Reinforced education on injection site care and signs of adverse effects.  CMP obtained per protocol   Provided printed copy of education sheet and injection schedule.   Care Plan:   Continue with bimonthly injection schedule for next visit   Follow up with lab results and monitor for side effects.   Encourage continued adherence and open communication about barriers to care.   Next Cabenuva  injection appointment scheduled for: ***.   Continue PHQ-9 screening and support at each visit.   Provide ongoing adherence and psychosocial support as needed.      Electronically signed by:   Lucienne JONETTA Kleine, RN

## 2024-10-28 NOTE — Patient Instructions (Signed)
 Thank you for trusting [facility] with your care!     Call [phone] to schedule your colonoscopy    Please note that your laboratory and other results may be visible to you in real time, possibly before they reach your provider. Please allow 48 hours for clinical interpretation of these results. Importantly, even if a result is flagged as abnormal, it may not be one that impacts your health.    The ID clinic phone number is [phone] (toll free [phone]).  The ID clinic fax number is [phone].    For urgent issues on nights and weekends you may reach the ID Physician on call through the [facility] Operator at [phone].     [name], MD    [facility] at [facility]  [address]   [place], [place] [address]  Phone: [phone]   Fax: [phone]
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
## 2024-10-28 NOTE — Progress Notes (Signed)
 Duration of Intervention: ~60 minutes   Type of Contact: in-person   Reason for Contact: Initial Cabenuva  injection for treatment of HIV-1.   Assessment: Patient presents for first Cabenuva  injection. Recent labs reviewed and support safe initiation. Patient previously completed counseling on treatment expectations, adherence, and injection protocol. Patient education sheet was reviewed again today. Patient voiced understanding of treatment plan, expected side effects, and need for timely follow-up. PHQ-9 screening completed. PHQ-9 score:6. No concerns identified that would delay initiation. Scores of 9 or above will require meeting with Social Work before leaving.  Adherence: Patient verbalized understanding of importance of adherence to Cabenuva  injection schedule. Explained the ??7-day scheduling window for future injections. Patient acknowledged and confirmed understanding of this requirement.   Intervention:   Administered first injection per protocol. 21G 2    Patient monitored post-injection for adverse reactions.   Patient tolerated injections well.   Reinforced education on injection site care and signs of adverse effects.  CMP obtained per protocol   Provided printed copy of education sheet and injection schedule.   Care Plan:   Continue with bimonthly injection schedule for next visit   Follow up with lab results and monitor for side effects.   Encourage continued adherence and open communication about barriers to care.   Next Cabenuva  injection appointment scheduled for: 11/26/2024.   Continue PHQ-9 screening and support at each visit.   Provide ongoing adherence and psychosocial support as needed.      Electronically signed by:   Clydie Bihari, RN

## 2024-10-29 LAB — HEPATITIS C ANTIBODY
HCV S/CO VALUE: 0.15
HEPATITIS C ANTIBODY: NONREACTIVE

## 2024-10-29 LAB — HIV RNA, QUANTITATIVE, PCR: HIV RNA QNT RSLT: NOT DETECTED

## 2024-11-03 NOTE — Progress Notes (Signed)
 I saw and evaluated the patient, participating in the key portions of the service.  I reviewed the resident???s note.  I agree with the resident???s findings and plan. Yvonna Alanis, MD

## 2024-11-09 ENCOUNTER — Ambulatory Visit: Admit: 2024-11-09 | Payer: Medicare (Managed Care)

## 2024-11-09 NOTE — Progress Notes (Signed)
 [facility]  [address]  [address]  [place], [place] [address]  Phone: [phone]     Ryan Davenport did not show for their scheduled Occupational Therapy Evaluation on 11/09/24 at 10:30 am. I have been unable to reach the patient by phone.  There was not available voicemail to leave a message.      This is patient's 1st evaluation no call no show. Patient was contacted in an attempt to reschedule the appointment. .                   Signed: Asma Boldon B Avaiah Stempel, OT  11/09/2024 10:52 AM

## 2024-11-11 NOTE — Progress Notes (Signed)
 [facility] Administered Medication Refill Coordination Note      NAME:Ryan Davenport DOB: [birth year]      Medication: CABENUVA  600 mg/3 mL- 900 mg/3 mL extended-release injection (cabotegravir -rilpivirine )    Day Supply: 56 days      SHIPPING      Next delivery from [facility] [phone]) to [facility] clinic for [name] is scheduled for 11/18/2024.    Clinic contact: BJ Turner    Patient's next nurse visit for administration: 11/26/2024.    We will follow up with clinic monthly for standard refill processing and delivery.      [name]  [facility] Specialty Technician

## 2024-11-16 NOTE — Telephone Encounter (Signed)
 Duration of Intervention: ~20-30 minutes   Type of Contact: in-person   Reason for Contact: Patient expresses interest in transitioning to long-acting injectable antiretroviral therapy (Cabenuva ).   Assessment: Patient is currently virologically suppressed on daily oral ART with stable CD4 count and no history of virologic failure or resistance to NNRTIs or integrase inhibitors. Patient demonstrates a strong interest in switching to long-acting injectable therapy for convenience and adherence support. Reviewed patient history, including previous adherence, labs, and medication tolerability.   Adherence: Patient reports good adherence to current oral regimen. No recent missed doses. Patient states preference for LAI to reduce daily pill burden and enhance privacy or convenience.   Intervention: Provided education on Cabenuva  (cabotegravir /rilpivirine ), including eligibility criteria, monthly injection schedule, side effects, and importance of appointment adherence. Addressed questions about the medication, potential side effects, missed dose window, and insurance coverage. Confirmed understanding of long-term commitment required.    Care Plan:   Proceed with Cabenuva  eligibility labs and baseline assessment   Start oral lead-in or direct-to-inject pathway (based on clinical decision and shared decision-making)   Coordinate with clinic scheduler to set up follow-up for Cabenuva  injection start date   Provide patient with injection calendar and education materials   LAI Coordinator to review insurance coverage and medication access     Electronically signed by:   Clydie Bihari, RN

## 2024-11-17 MED FILL — CABENUVA 600 MG/3 ML-900 MG/3 ML IM SUSPENSION, EXTENDED RELEASE: INTRAMUSCULAR | 30 days supply | Qty: 6 | Fill #1

## 2024-11-26 ENCOUNTER — Ambulatory Visit: Admit: 2024-11-26 | Discharge: 2024-11-27 | Payer: Medicare (Managed Care)

## 2024-11-26 ENCOUNTER — Encounter: Admit: 2024-11-26 | Discharge: 2024-11-27 | Payer: Medicare (Managed Care)

## 2024-11-26 DIAGNOSIS — B20 Human immunodeficiency virus [HIV] disease: Principal | ICD-10-CM

## 2024-11-26 LAB — CBC W/ AUTO DIFF
BASOPHILS ABSOLUTE COUNT: 0 10*9/L (ref 0.0–0.1)
BASOPHILS RELATIVE PERCENT: 0.7 %
EOSINOPHILS ABSOLUTE COUNT: 0.1 10*9/L (ref 0.0–0.5)
EOSINOPHILS RELATIVE PERCENT: 2.1 %
HEMATOCRIT: 37.5 % — ABNORMAL LOW (ref 39.0–48.0)
HEMOGLOBIN: 12.5 g/dL — ABNORMAL LOW (ref 12.9–16.5)
LYMPHOCYTES ABSOLUTE COUNT: 1.1 10*9/L (ref 1.1–3.6)
LYMPHOCYTES RELATIVE PERCENT: 28.3 %
MEAN CORPUSCULAR HEMOGLOBIN CONC: 33.4 g/dL (ref 32.0–36.0)
MEAN CORPUSCULAR HEMOGLOBIN: 27.5 pg (ref 25.9–32.4)
MEAN CORPUSCULAR VOLUME: 82.3 fL (ref 77.6–95.7)
MEAN PLATELET VOLUME: 7 fL (ref 6.8–10.7)
MONOCYTES ABSOLUTE COUNT: 0.5 10*9/L (ref 0.3–0.8)
MONOCYTES RELATIVE PERCENT: 11.8 %
NEUTROPHILS ABSOLUTE COUNT: 2.2 10*9/L (ref 1.8–7.8)
NEUTROPHILS RELATIVE PERCENT: 57.1 %
PLATELET COUNT: 268 10*9/L (ref 150–450)
RED BLOOD CELL COUNT: 4.56 10*12/L (ref 4.26–5.60)
RED CELL DISTRIBUTION WIDTH: 16.3 % — ABNORMAL HIGH (ref 12.2–15.2)
WBC ADJUSTED: 3.8 10*9/L (ref 3.6–11.2)

## 2024-11-26 MED ADMIN — cabotegravir-rilpivirine (CABENUVA) 600 mg-900 mg/3 mL extended-release injection 6 mL: 6 mL | INTRAMUSCULAR | @ 20:00:00 | Stop: 2024-11-26

## 2024-11-26 NOTE — Progress Notes (Signed)
 Duration of Intervention: 60 minutes   Type of Contact: in-person   Reason for Contact: Patient in clinic for scheduled Cabenuva  injection for ongoing HIV-1 maintenance therapy.   Intervention: Patient presented for routine Cabenuva  injection. Discussed any changes since the last visit:   Mood:  No changes  Sleep: No Changes   Cabenuva  administered per protocol:   Medication: Cabotegravir  and Rilpivirine  Bi-Monthly   Needle size: 22G 2  Patient tolerated the injection well   Observed post-injection; no immediate adverse effects noted   Care Plan:   Continue Cabenuva  per established dosing interval   Next injection scheduled for: 2/4   Patient will meet with the provider at the next visit   Monitor for side effects and assess mood/sleep changes at follow-ups   Reinforce treatment adherence to ??7-day dosing window      Electronically signed by:   Tobias JONELLE Pereyra, RN

## 2024-11-27 LAB — LYMPH MARKER LIMITED,FLOW
ABSOLUTE CD3 CNT: 913 {cells}/uL — ABNORMAL LOW (ref 915–3400)
ABSOLUTE CD4 CNT: 484 {cells}/uL — ABNORMAL LOW (ref 510–2320)
ABSOLUTE CD8 CNT: 363 {cells}/uL (ref 180–1520)
CD3% (T CELLS): 83 % (ref 61–86)
CD4% (T HELPER): 44 % (ref 34–58)
CD4:CD8 RATIO: 1.3 (ref 0.9–4.8)
CD8% T SUPPRESR: 33 % (ref 12–38)

## 2024-11-27 LAB — HIV RNA, QUANTITATIVE, PCR
HIV RNA LOG10: 1.59 {Log_copies}/mL — ABNORMAL HIGH (ref <0.00)
HIV RNA QNT RSLT: DETECTED — AB
HIV RNA: 39 {copies}/mL — ABNORMAL HIGH (ref <0)

## 2024-12-09 DIAGNOSIS — B20 Human immunodeficiency virus [HIV] disease: Principal | ICD-10-CM

## 2024-12-27 DIAGNOSIS — B20 Human immunodeficiency virus [HIV] disease: Principal | ICD-10-CM

## 2024-12-30 MED ORDER — LEVETIRACETAM 750 MG TABLET
ORAL_TABLET | Freq: Two times a day (BID) | ORAL | 5 refills | 30.00000 days
Start: 2024-12-30 — End: 2025-06-28
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
## 2024-12-31 MED ORDER — LEVETIRACETAM 750 MG TABLET
ORAL_TABLET | Freq: Two times a day (BID) | ORAL | 5 refills | 30.00000 days | Status: CP
Start: 2024-12-31 — End: 2025-06-29

## 2025-01-04 NOTE — Progress Notes (Signed)
 Specialty Medication(s): Dupixent     Mr.Kihn has been dis-enrolled from the [facility] specialty pharmacy services as a result of multiple unsuccessful outreach attempts by the pharmacy.    Additional information provided to the patient: see Brianna's note from 10/7 for attempts    Ermalinda Joubert A [facility] Specialty Pharmacist

## 2025-01-05 ENCOUNTER — Ambulatory Visit
Admission: EM | Admit: 2025-01-05 | Discharge: 2025-01-09 | Disposition: A | Discharge status: Home / Self Care | Payer: Medicare (Managed Care) | Admitting: Internal Medicine

## 2025-01-05 ENCOUNTER — Inpatient Hospital Stay
Admission: EM | Admit: 2025-01-05 | Discharge: 2025-01-09 | Disposition: A | Discharge status: Home / Self Care | Payer: Medicare (Managed Care) | Admitting: Internal Medicine

## 2025-01-05 ENCOUNTER — Encounter
Admission: EM | Admit: 2025-01-05 | Discharge: 2025-01-09 | Disposition: A | Discharge status: Home / Self Care | Payer: Medicare (Managed Care) | Attending: Anesthesiology | Admitting: Internal Medicine

## 2025-01-05 DIAGNOSIS — L209 Atopic dermatitis, unspecified: Principal | ICD-10-CM

## 2025-01-05 DIAGNOSIS — E861 Hypovolemia: Principal | ICD-10-CM

## 2025-01-05 DIAGNOSIS — Z794 Long term (current) use of insulin: Principal | ICD-10-CM

## 2025-01-05 DIAGNOSIS — E1140 Type 2 diabetes mellitus with diabetic neuropathy, unspecified: Principal | ICD-10-CM

## 2025-01-05 LAB — BLOOD GAS, VENOUS
BASE EXCESS VENOUS: -10.6 — ABNORMAL LOW (ref -2.0–2.0)
CARBOXYHEMOGLOBIN, VENOUS: 1.1 % (ref <1.2)
HCO3 VENOUS: 16 mmol/L — ABNORMAL LOW (ref 22–27)
METHEMOGLOBIN, VENOUS: <1 % (ref <1.5)
O2 SATURATION VENOUS: 80.5 % (ref 40.0–85.0)
OXYHEMOGLOBIN, VENOUS: 79.2 % (ref 40.0–85.0)
PCO2 VENOUS: 37 mmHg — ABNORMAL LOW (ref 40–60)
PH VENOUS: 7.25 — ABNORMAL LOW (ref 7.32–7.43)
PO2 VENOUS: 50 mmHg (ref 30–55)

## 2025-01-05 LAB — CBC W/ AUTO DIFF
BASOPHILS ABSOLUTE COUNT: 0 10*9/L (ref 0.0–0.1)
BASOPHILS RELATIVE PERCENT: 0.3 %
EOSINOPHILS ABSOLUTE COUNT: 0 10*9/L (ref 0.0–0.5)
EOSINOPHILS RELATIVE PERCENT: 0.4 %
HEMATOCRIT: 35.4 % — ABNORMAL LOW (ref 39.0–48.0)
HEMOGLOBIN: 11.8 g/dL — ABNORMAL LOW (ref 12.9–16.5)
LYMPHOCYTES ABSOLUTE COUNT: 1.3 10*9/L (ref 1.1–3.6)
LYMPHOCYTES RELATIVE PERCENT: 13.2 %
MEAN CORPUSCULAR HEMOGLOBIN CONC: 33.4 g/dL (ref 32.0–36.0)
MEAN CORPUSCULAR HEMOGLOBIN: 28.2 pg (ref 25.9–32.4)
MEAN CORPUSCULAR VOLUME: 84.3 fL (ref 77.6–95.7)
MEAN PLATELET VOLUME: 7.7 fL (ref 6.8–10.7)
MONOCYTES ABSOLUTE COUNT: 1.2 10*9/L — ABNORMAL HIGH (ref 0.3–0.8)
MONOCYTES RELATIVE PERCENT: 12.4 %
NEUTROPHILS ABSOLUTE COUNT: 7.3 10*9/L (ref 1.8–7.8)
NEUTROPHILS RELATIVE PERCENT: 73.7 %
PLATELET COUNT: 303 10*9/L (ref 150–450)
RED BLOOD CELL COUNT: 4.2 10*12/L — ABNORMAL LOW (ref 4.26–5.60)
RED CELL DISTRIBUTION WIDTH: 16 % — ABNORMAL HIGH (ref 12.2–15.2)
WBC ADJUSTED: 10 10*9/L (ref 3.6–11.2)

## 2025-01-05 LAB — COMPREHENSIVE METABOLIC PANEL
ALBUMIN: 2.5 g/dL — ABNORMAL LOW (ref 3.4–5.0)
ALKALINE PHOSPHATASE: 70 U/L (ref 46–116)
ALT (SGPT): 26 U/L (ref 10–49)
ANION GAP: 14 mmol/L (ref 5–14)
AST (SGOT): 47 U/L — ABNORMAL HIGH (ref <=34)
BILIRUBIN TOTAL: 0.2 mg/dL — ABNORMAL LOW (ref 0.3–1.2)
BLOOD UREA NITROGEN: 46 mg/dL — ABNORMAL HIGH (ref 9–23)
BUN / CREAT RATIO: 6
CALCIUM: 8.4 mg/dL — ABNORMAL LOW (ref 8.7–10.4)
CHLORIDE: 105 mmol/L (ref 98–107)
CO2: 16 mmol/L — ABNORMAL LOW (ref 20.0–31.0)
CREATININE: 7.09 mg/dL — ABNORMAL HIGH (ref 0.73–1.18)
EGFR CKD-EPI (2021) MALE: 8 mL/min/1.73m2 — ABNORMAL LOW (ref >=60)
GLUCOSE RANDOM: 99 mg/dL (ref 70–179)
POTASSIUM: 4 mmol/L (ref 3.4–4.8)
PROTEIN TOTAL: 10.5 g/dL — ABNORMAL HIGH (ref 5.7–8.2)
SODIUM: 135 mmol/L (ref 135–145)

## 2025-01-05 LAB — LACTATE, VENOUS, WHOLE BLOOD: LACTATE BLOOD VENOUS: 1.6 mmol/L (ref 0.5–1.8)

## 2025-01-05 LAB — PRO-BNP: PRO-BNP: 515 pg/mL — ABNORMAL HIGH (ref <=300.0)

## 2025-01-05 LAB — HIGH SENSITIVITY TROPONIN I - SERIAL: HIGH SENSITIVITY TROPONIN I: 13 ng/L (ref <=53)

## 2025-01-05 MED ADMIN — sodium chloride 0.9% (NS) bolus 500 mL: 500 mL | INTRAVENOUS | Stop: 2025-01-05

## 2025-01-05 NOTE — Progress Notes (Cosign Needed)
 [facility] Medicine at [facility]     Reason for visit: Follow up    Questions / Concerns that need to be addressed: last Wednesday started feeling really bad was very hot, night sweats, lost his voice yesterday. Has bumps all over body and they're itchy and was in bed for all last week without food/meds. Very tired and sleepy.     Screening BP- 75/52 105            Diabetes:  Regularly checking blood sugars?: no  .    HCDM reviewed and updated in Epic:    We are working to make sure all of our patients??? wishes are updated in Epic and part of that is documenting a Environmental Health Practitioner for each patient  A Health Care Decision Maker is someone you choose who can make health care decisions for you if you are not able - who would you most want to do this for you????  is already up to date.    HCDM (patient stated preference): Ryan Davenport, Ryan Davenport - Mother - [phone]        Annual Screenings:   SDOH  and Audit/Alcohol    __________________________________________________________________________________________    SCREENINGS COMPLETED IN FLOWSHEETS      AUDIT       PHQ2       PHQ9          GAD7       COPD Assessment       Falls Risk

## 2025-01-05 NOTE — Treatment Plan (Signed)
 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  Per [facility] patient has been having flulike symptoms as well as decreased p.o. intake for the last week and a half.  He is noted to have diarrhea as well. Upon arrival to the ED patient was hypotensive 74/43, given 2 L fluid bolus, with some response. Labs notable for sCr of 7.09, bicarb of 16, pH 7.25. Nephrology was consulted for AKI     # AKI on CKD Stage 3   Baseline creatinine around 1.3-1.4. He has a hx  of notable proteinuria and trace blood on previous UA.  Previous UACR of 976 on 06/03/2024. No previous nephrology records however suspect CKD secondary from prolonged DM, HTN, and possibly HIV. Acute injury is likely in the setting of tubular injury from prolonged hypotension from poor oral intake and diarrhea/ acute infection.Given history of proteinuria/hematuria with HIV history, GN could also be considered-- be infectious related, ANCA etc.Will evaluate urine sediment to assess for dysmorphic rbc's or cellular casts.  Renal US  without evidence of obstruction.     Renal US :  Normal size and echogenicity. No solid renal masses, calculi, or hydronephrosis. There are multiple cysts within the left kidney with the largest measuring approximately 8 cm..  Right kidney: 12.5 cm, Left kidney: 13.5 cm       #NAGMA   - In the setting of diarrhea and AKI   - Appropriate respiratory compensation     # Hypotension   - in the setting of acute infection and decrease po intake/ diarrhea     RECOMMENDATIONS:   -Agree with fluid bolus, consider maintenance IV fluids +75 ml/hour if patient has still not maintaining p.o.  - Pending urinalysis.  UACR and UPCR ordered  -Start sodium bicarbonate 650 mg 3 times daily  -Will evaluate urine sediment tomorrow  - Strict urine output monitoring   - Avoid nephrotoxic agents including NSAIDs and morphine  limiting contrast imaging. Maintain MAP greater than 65. Hold anti-hypertensive agents.     - Renally dose meds to GFR   -Formal consult in the morning  - We will continue to follow.     Jeromiah Ohalloran A Khalilah Hoke, DO  01/05/2025 9:33 PM

## 2025-01-05 NOTE — ED Provider Notes (Signed)
 [facility]  Emergency Department Provider Note      ED Clinical Impression       Diagnosis ICD-10-CM Associated Orders   1. Renal failure, unspecified chronicity  N19       2. Chest pain, unspecified type  R07.9       3. Atopic dermatitis, unspecified type  L20.9       4. Transient hypotension  I95.9                Impression, Medical Decision Making, Progress Notes and Critical Care      Impression, Differential Diagnosis and Plan of Care    64 year old male with a past medical history of HIV on antiretrovirals followed by ID, focal seizures on keppra , type 2 diabetes, VTE and chronic Eliquis , atopic dermatitis who presents from his primary care physician's office for low blood pressure in the setting of flulike illness and rash on extremities for 1 week and intermittent chest pain (none currently).  Patient reports that he has not eaten well for the past week.  He has not taken his medications with feeling weak since 01/03/2025.    Ddx: Flulike illness, sepsis, pneumonia, electrolyte derangement, atypical ACS, anemia    ED course: Patient presents and he is initially well-appearing without any focal deficits.  EKG demonstrated no evidence of acute ischemia.  Patient's blood pressure was not low initially upon arrival however subsequently became low when she was roomed.  Troponin is not elevated and we will as is BNP.  Surprisingly the patient was found to be in significant renal failure with a creatinine of 7.09 when his baseline is 1.3-1.7.  On further questioning patient states that he has not urinated today but denies any suprapubic pain.  Suspect this is mainly prerenal and he has been given 1500 of IV fluids.  X-ray and renal ultrasound is pending.  Anticipate patient will need to be admitted today.     ED Course as of 01/05/25 2200   Tue Jan 05, 2025   1734 EKG and rhythm strip are interpreted by myself at  January 05, 2025 5:34 PM    EKG: tachycardic sinus rhythm] at heart rate of 102, normal QRS duration, QTc 471, nonspecific ST segments and T waves no ectopy  EKG not consistent with Acute STEMI  Rhythm strip: tachycardic sinus rhythm in lead II     1814 SpO2: 99 %  Satting well on room air   1905 Despite patient having fluids hanging, recent blood pressure systolic 82/50.  He remains slightly tachycardic.  Will give him a liter and a half of fluids since he is satting well on room air.  Will add on blood cultures.  All labs are pending   1940 RBB is stable from 04/17/2024   1944 CBC w/ Differential(!):    WBC 10.0   RBC 4.20(!)   HGB 11.8(!)   HCT 35.4(!)   MCV 84.3   MCH 28.2   MCHC 33.4   RDW 16.0(!)   MPV 7.7   Platelet 303   Neutrophils % 73.7   Lymphocytes % 13.2   Monocytes % 12.4   Eosinophils % 0.4   Basophils % 0.3   Absolute Neutrophils 7.3   Absolute Lymphocytes 1.3   Absolute Monocytes  1.2(!)   Absolute Eosinophils 0.0   Absolute Basophils  0.0  No significant leukocytosis, no profound anemia   1955 hsTroponin I: 13  Not elevated   1958 PRO-BNP(!): 515.0  Not elevated  February 04, 2007 Glucose: 99   2011 Creatinine(!): 7.09  Patient is now in renal failure.  On further questioning patient tells me that he has not urinated at all today.  Will get a bladder scan and insert a Foley.  Will order renal ultrasound.  Anticipate will need to talk to nephrology.  With security assist that patient's anion gap is not elevated   2057-02-04 XR Chest 2 views  No acute abnormality   February 04, 2099 US  Renal Complete  No obstruction   February 05, 2104 Nephrology paged   02-05-24 Patient discussed with nephrology consultant on-call (Dr. Lenwood).  She recommends a total of 2 L bolus, followed by 75 mL/hr LR maintenance for 12 hours and Sodium bicarb tabs 650 TID. They will round on him tomorrow morning.    02-05-2156 BP: 93/55  Improved will call in to MAO   2200 MAO paged       Additional MDM Elements    Data(2/3 categories following were performed):  I reviewed or ordered at least three unique tests, external notes, and/or the history required an independent historian as one of the three requirements as following: At least 3 labs/imaging studies were obtained and/or reviewed.  AND    I discussed the management of the patient with the following external physician or qualified healthcare provider: Admitting physician    Risk:  This patient has a high risk of morbidity due to further diagnostic testing or treatment. Rationale: Decision made regarding admission    Admit Level 5 - Labs/Rads, Admit, Consult: Nephrology    Suggested E/M Coding Level: 5, 99285    This level has been selected based on the 02/04/2022 CPT guidelines for E/M codes in the Emergency Department based on 2/3 of the CoPA, Data, and Risk.      Portions of this record have been created using Scientist, clinical (histocompatibility and immunogenetics). Dictation errors have been sought, but may not have been identified and corrected.    See chart and nursing documentation for additional ED course details.    ____________________________________________         History        Reason for Visit  Chest Pain      HPI   Ryan Davenport is a 64 y.o. male with past medical history of HIV on antiretrovirals followed by ID, focal seizures on keppra , type 2 diabetes, VTE and chronic Eliquis , atopic dermatitis who presents from his primary care physician's office for low blood pressure in the setting of flulike illness and rash on extremities for 1 week and intermittent chest pain (none currently).  Patient reports that he has not eaten well for the past week.  He has not taken his medications since 01/03/2025.  He denies any shortness of breath.  He does report that his urine and stools have become more dark than usual.  Denies any abdominal pain.  Denies any nausea currently.     Outside Historian(s)  None    Past Medical History[1]    Past Surgical History[2]      Current Facility-Administered Medications:     apixaban  (ELIQUIS ) tablet 5 mg, 5 mg, Oral, BID, Nicholaus Rolland Evener, MD, 5 mg at 01/05/25 02/04/11    cloBAZam  (ONFI ) split tablet 5 mg, 5 mg, Oral, BID, Nicholaus Rolland Evener, MD    hydrocortisone  2.5 % ointment 1 Application, 1 Application, Topical, BID, Nicholaus Rolland Evener, MD    hydrOXYzine  (ATARAX ) tablet 25 mg, 25 mg, Oral, Q6H PRN, Nicholaus Rolland Evener, MD    lactated ringers  bolus 500  mL, 500 mL, Intravenous, Once, Nicholaus, Rolland Evener, MD    lactated Ringers  infusion, 75 mL/hr, Intravenous, Continuous, Nicholaus, Rolland Evener, MD    levETIRAcetam  (KEPPRA ) tablet 1,500 mg, 1,500 mg, Oral, BID, Nicholaus Rolland Evener, MD, 1,500 mg at 01/05/25 2012    sodium bicarbonate tablet 650 mg, 650 mg, Oral, TID, Nicholaus Rolland Evener, MD    Current Outpatient Medications:     atorvastatin  (LIPITOR ) 40 MG tablet, Take 1 tablet (40 mg total) by mouth daily., Disp: 90 tablet, Rfl: 3    betamethasone  dipropionate 0.05 % cream, Apply 1 Application topically two (2) times a day., Disp: 30 g, Rfl: PRN    BIKTARVY  50-200-25 mg tablet, Take 1 tablet by mouth daily., Disp: 90 tablet, Rfl: 3    blood sugar diagnostic (GLUCOSE BLOOD) Strp, Use to check blood sugar as directed with insulin  3 times a day & for symptoms of high or low blood sugar., Disp: 100 strip, Rfl: 0    blood-glucose meter kit, Use as instructed, Disp: 1 each, Rfl: 0    cabotegravir -rilpivirine  (CABENUVA ) 600 mg-900 mg/3 mL extended-release injection, Inject 6 mL into the muscle every thirty (30) days., Disp: 6 mL, Rfl: 1    cabotegravir -rilpivirine  (CABENUVA ) 600 mg-900 mg/3 mL extended-release injection, Inject 6 mL into the muscle every 8 weeks., Disp: 6 mL, Rfl: 6    ciclopirox  (PENLAC ) 8 % solution, Apply topically nightly. Apply over nail and surrounding skin. Apply daily over previous coat. After seven (7) days, may remove with alcohol  and continue cycle., Disp: 6.6 mL, Rfl: 5    cloBAZam  (ONFI ) 10 mg Tab, Take 0.5 (one-half) tablet (5 mg total) by mouth two (2) times a day., Disp: 30 tablet, Rfl: 5    colchicine  (COLCRYS ) 0.6 mg tablet, TAKE 1 TABLET BY MOUTH DAILY AS NEEDED FOR GOUT, Disp: 90 tablet, Rfl: 3    cyclobenzaprine  (FLEXERIL ) 5 MG tablet, Take 1 tablet (5 mg total) by mouth., Disp: , Rfl:     dupilumab  (DUPIXENT  SYRINGE) 300 mg/2 mL syringe, Inject the contents of 1 syringe (300 mg total) under the skin every fourteen (14) days. For maintenance dose, Disp: 4 mL, Rfl: 11    ELIQUIS  5 mg Tab, Take 1 tablet (5 mg total) by mouth two (2) times a day., Disp: 60 tablet, Rfl: PRN    empagliflozin  (JARDIANCE ) 25 mg tablet, Take 1 tablet (25 mg total) by mouth daily., Disp: 90 tablet, Rfl: 3    empty container Misc, Use as directed to dispose for Dupixent  pens., Disp: 1 each, Rfl: 2    furosemide  (LASIX ) 20 MG tablet, Take 1 tablet (20 mg total) by mouth daily as needed., Disp: , Rfl:     gabapentin  (NEURONTIN ) 100 MG capsule, Take 1 capsule (100 mg total) by mouth Three (3) times a day., Disp: 90 capsule, Rfl: 0    hydroCHLOROthiazide 12.5 MG tablet, , Disp: , Rfl:     hydrocortisone  2.5 % ointment, Apply 1 Application topically two (2) times a day. To active areas of the face until smooth, Disp: 454 g, Rfl: 5    hydrOXYzine  (ATARAX ) 25 MG tablet, Take 1 tablet (25 mg total) by mouth every six (6) hours as needed., Disp: , Rfl:     KLOR-CON  M15 15 mEq tablet, , Disp: , Rfl:     lancets Misc, Use to check blood sugar as directed with insulin  3 times a day & for symptoms of high or low blood sugar., Disp: 100 each, Rfl: 0  levETIRAcetam  (KEPPRA ) 750 MG tablet, Take 2 tablets (1,500 mg total) by mouth two (2) times a day., Disp: 120 tablet, Rfl: 5    lisinopril  (PRINIVIL ,ZESTRIL ) 10 MG tablet, Take 1 tablet (10 mg total) by mouth daily., Disp: 30 tablet, Rfl: 0    methocarbamoL  (ROBAXIN ) 750 MG tablet, 750mg  4x daily prn muscle spasms, Disp: 60 tablet, Rfl: 2    metoPROLOL  succinate (TOPROL -XL) 25 MG 24 hr tablet, Take 0.5 tablets (12.5 mg total) by mouth daily., Disp: , Rfl:     omeprazole  (PRILOSEC) 40 MG capsule, 40mg  BID, Disp: 180 capsule, Rfl: 3    pen needle, diabetic 32 gauge x 5/32 (4 mm) Ndle, Use with insulin  up to 4 times/day as needed., Disp: 1 each, Rfl: 0    sertraline  (ZOLOFT ) 50 MG tablet, Take 1 tablet (50 mg total) by mouth daily., Disp: 30 tablet, Rfl: 0    triamcinolone  (KENALOG ) 0.1 % ointment, Apply topically two (2) times a day. To active areas of trunk and extremities until smooth, Disp: 454 g, Rfl: 5    triamcinolone  (KENALOG ) 0.1 % ointment, Apply the medication twice daily to affected areas of the skin until smooth. Then stop and re-start as soon as the skin changes come back., Disp: 454 g, Rfl: 10    Allergies  Latex, Shellfish containing products, Metformin , and Penicillins    Family History[3]    Short Social History[4]       Physical Exam     ED Triage Vitals [01/05/25 1724]   Enc Vitals Group      BP 100/55      Pulse 105      SpO2 Pulse       Resp 18      Temp 36.7 ??C (98.1 ??F)      Temp src       SpO2 99 %      Weight       Height       Head Circumference       Peak Flow       Pain Score       Pain Loc       Pain Education       Exclude from Growth Chart        Constitutional: Alert and oriented. Appeaers ill.  Eyes: Conjunctivae are normal.  ENT       Head: Normocephalic and atraumatic.       Nose: No congestion.       Mouth/Throat: Mucous membranes are dry.       Neck: No stridor.  Hematological/Lymphatic/Immunilogical: No cervical lymphadenopathy.  Cardiovascular: Normal rate, regular rhythm. Normal and symmetric distal pulses are present in all extremities.  Respiratory: Normal respiratory effort. Breath sounds are normal.  Gastrointestinal: Soft and nontender. There is no CVA tenderness.  Genitourinary: No penile or scrotal abnormality  Musculoskeletal: Normal range of motion in all extremities.       Right lower leg: No tenderness or edema.       Left lower leg: No tenderness or edema.  Neurologic: Normal speech and language. No gross focal neurologic deficits are appreciated.  Skin: Patchy rash with excoriations on his bilateral forearms especially on his right arm  Psychiatric: Mood and affect are normal. Speech and behavior are normal.       Radiology     XR Chest 2 views   Final Result      No acute lung consolidation.  US  Renal Complete   Final Result   Multiple left renal cysts measuring up to 8 cm.   Otherwise unremarkable sonographic evaluation of the kidneys.   Limited views of the bladder secondary to underdistention.                       Procedures including Critical Care     Critical Care    Performed by: Nicholaus Rolland Evener, MD  Authorized by: Nicholaus Rolland Evener, MD    Critical care provider statement:     Critical care time (minutes):  35    Critical care was necessary to treat or prevent imminent or life-threatening deterioration of the following conditions:  Dehydration    Critical care was time spent personally by me on the following activities:  Development of treatment plan with patient or surrogate, discussions with consultants, evaluation of patient's response to treatment, examination of patient, interpretation of cardiac output measurements, obtaining history from patient or surrogate, ordering and performing treatments and interventions, ordering and review of laboratory studies, ordering and review of radiographic studies, pulse oximetry, re-evaluation of patient's condition and review of old charts    I assumed direction of critical care for this patient from another provider in my specialty: no      Care discussed with: admitting provider    Comments:      Aggressive fluid hydration             [1]   Past Medical History:  Diagnosis Date    Asthma (HHS-HCC)     Asymptomatic human immunodeficiency virus (HIV) infection status    (CMS-HCC) 04/09/2013    Diagnosed in 2005 with a CD4 of 32 and on at least one prior regimen on a study, then switched to Truvada with ritonavir boosted atazanavir. Changed to Truvada with boosted darunavir secondary to reflux symptoms. Has remained undetectable for several years with CD4 count greater than 500.      CAD (coronary artery disease)     Depression     Diabetes mellitus (CMS-HCC)     DVT (deep venous thrombosis) (CMS-HCC)     Eczema     Enlargement of liver     GERD (gastroesophageal reflux disease)     HIV (human immunodeficiency virus infection)    (CMS-HCC)     Hyperlipidemia     Hypertension     Myocardial infarction (CMS-HCC)     Obesity     PICC (peripherally inserted central catheter) removal 04/21/2021    Pulmonary embolism (CMS-HCC) 2012    Sleep apnea    [2]   Past Surgical History:  Procedure Laterality Date    PR COLONOSCOPY FLX DX W/COLLJ SPEC WHEN PFRMD N/A 11/11/2013    Procedure: COLONOSCOPY, FLEXIBLE, PROXIMAL TO SPLENIC FLEXURE; DIAGNOSTIC, W/WO COLLECTION SPECIMEN BY BRUSH OR WASH;  Surgeon: Norwood Hose, MD;  [facility];  Service: Gastroenterology    PR UPPER GI ENDOSCOPY,DIAGNOSIS N/A 04/01/2019    Procedure: UGI ENDO, INCLUDE ESOPHAGUS, STOMACH, & DUODENUM &/OR JEJUNUM; DX W/WO COLLECTION SPECIMN, BY BRUSH OR WASH;  Surgeon: Lorrene Georgi Rase, MD;  [facility];  Service: Gastroenterology    SKIN BIOPSY     [3]   Family History  Problem Relation Age of Onset    Diabetes Mother     Heart disease Mother     Hypertension Mother     Arthritis Mother     Aneurysm Father     Skin cancer  Neg Hx     Melanoma Neg Hx     Basal cell carcinoma Neg Hx     Squamous cell carcinoma Neg Hx    [4]   Social History  Tobacco Use    Smoking status: Never     Passive exposure: Never    Smokeless tobacco: Never   Vaping Use    Vaping status: Never Used   Substance Use Topics    Alcohol  use: No     Alcohol /week: 0.0 standard drinks of alcohol     Drug use: No        Nicholaus Rolland Evener, MD  01/05/25 2200
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
## 2025-01-05 NOTE — ED Triage Note (Signed)
 BIB [facility] c/o CP. Initially at clinic for check up r/t flu-like symptoms. Found to be hypotensive and endorsing CP.

## 2025-01-06 LAB — URINALYSIS WITH MICROSCOPY WITH CULTURE REFLEX PERFORMABLE
BILIRUBIN UA: NEGATIVE
GLUCOSE UA: NEGATIVE
GRANULAR CASTS: 1 /LPF — ABNORMAL HIGH (ref <=0)
HYALINE CASTS: 3 /LPF — ABNORMAL HIGH (ref 0–1)
KETONES UA: NEGATIVE
LEUKOCYTE ESTERASE UA: NEGATIVE
NITRITE UA: NEGATIVE
PH UA: 5.5 (ref 5.0–9.0)
PROTEIN UA: 50 — AB
RBC UA: <1 /HPF (ref <=3)
SPECIFIC GRAVITY UA: 1.009 (ref 1.003–1.030)
SQUAMOUS EPITHELIAL: <1 /HPF (ref 0–5)
UROBILINOGEN UA: <2
WAXY CASTS: 1 /LPF — ABNORMAL HIGH (ref <=0)
WBC UA: 2 /HPF (ref <=2)

## 2025-01-06 LAB — OSMOLALITY, SERUM: OSMOLALITY MEASURED: 307 mosm/kg — ABNORMAL HIGH (ref 275–295)

## 2025-01-06 LAB — RENAL FUNCTION PANEL
ALBUMIN: 2.4 g/dL — ABNORMAL LOW (ref 3.4–5.0)
ANION GAP: 10 mmol/L (ref 5–14)
BLOOD UREA NITROGEN: 40 mg/dL — ABNORMAL HIGH (ref 9–23)
BUN / CREAT RATIO: 6
CALCIUM: 8.4 mg/dL — ABNORMAL LOW (ref 8.7–10.4)
CHLORIDE: 110 mmol/L — ABNORMAL HIGH (ref 98–107)
CO2: 16 mmol/L — ABNORMAL LOW (ref 20.0–31.0)
CREATININE: 6.49 mg/dL — ABNORMAL HIGH (ref 0.73–1.18)
EGFR CKD-EPI (2021) MALE: 9 mL/min/1.73m2 — ABNORMAL LOW (ref >=60)
GLUCOSE RANDOM: 138 mg/dL (ref 70–179)
PHOSPHORUS: 6.2 mg/dL — ABNORMAL HIGH (ref 2.4–5.1)
SODIUM: 136 mmol/L (ref 135–145)

## 2025-01-06 LAB — CBC
HEMATOCRIT: 34 % — ABNORMAL LOW (ref 39.0–48.0)
HEMOGLOBIN: 11.4 g/dL — ABNORMAL LOW (ref 12.9–16.5)
MEAN CORPUSCULAR HEMOGLOBIN CONC: 33.6 g/dL (ref 32.0–36.0)
MEAN CORPUSCULAR HEMOGLOBIN: 28.5 pg (ref 25.9–32.4)
MEAN CORPUSCULAR VOLUME: 84.7 fL (ref 77.6–95.7)
MEAN PLATELET VOLUME: 7.9 fL (ref 6.8–10.7)
PLATELET COUNT: 266 10*9/L (ref 150–450)
RED BLOOD CELL COUNT: 4.01 10*12/L — ABNORMAL LOW (ref 4.26–5.60)
RED CELL DISTRIBUTION WIDTH: 15.9 % — ABNORMAL HIGH (ref 12.2–15.2)
WBC ADJUSTED: 8.1 10*9/L (ref 3.6–11.2)

## 2025-01-06 LAB — HIGH SENSITIVITY TROPONIN I - 6 HOUR SERIAL
HIGH SENSITIVITY TROPONIN - DELTA (2-6H): 1 ng/L (ref <=7)
HIGH-SENSITIVITY TROPONIN I - 6 HOUR: 10 ng/L (ref <=53)

## 2025-01-06 LAB — POTASSIUM: POTASSIUM: 4 mmol/L (ref 3.4–4.8)

## 2025-01-06 LAB — PROTEIN / CREATININE RATIO, URINE
CREATININE, URINE: 85.9 mg/dL
PROTEIN URINE: 119.9 mg/dL
PROTEIN/CREAT RATIO, URINE: 1.396

## 2025-01-06 LAB — ALBUMIN / CREATININE URINE RATIO
ALBUMIN QUANT URINE: 13.8 mg/dL
ALBUMIN/CREATININE RATIO: 160.7 ug/mg — ABNORMAL HIGH (ref 0.0–30.0)
CREATININE, URINE: 85.9 mg/dL

## 2025-01-06 LAB — OSMOLALITY, RANDOM URINE: OSMOLALITY URINE: 304 mosm/kg

## 2025-01-06 LAB — HIGH SENSITIVITY TROPONIN I - 2 HOUR SERIAL
HIGH SENSITIVITY TROPONIN - DELTA (0-2H): 2 ng/L (ref <=7)
HIGH-SENSITIVITY TROPONIN I - 2 HOUR: 11 ng/L (ref <=53)

## 2025-01-06 LAB — CREATININE, URINE: CREATININE, URINE: 80.6 mg/dL

## 2025-01-06 LAB — SODIUM, URINE, RANDOM: SODIUM URINE: 85 mmol/L

## 2025-01-06 MED ADMIN — sodium bicarbonate tablet 650 mg: 650 mg | ORAL | @ 15:00:00 | Stop: 2025-01-10

## 2025-01-06 MED ADMIN — sodium bicarbonate tablet 650 mg: 650 mg | ORAL | @ 03:00:00 | Stop: 2025-01-07

## 2025-01-06 MED ADMIN — sodium bicarbonate tablet 650 mg: 650 mg | ORAL | @ 20:00:00 | Stop: 2025-01-10

## 2025-01-06 MED ADMIN — levETIRAcetam (KEPPRA) tablet 1,500 mg: 1500 mg | ORAL | @ 01:00:00

## 2025-01-06 MED ADMIN — levETIRAcetam (KEPPRA) tablet 1,500 mg: 1500 mg | ORAL | @ 15:00:00

## 2025-01-06 MED ADMIN — atorvastatin (LIPITOR) tablet 40 mg: 40 mg | ORAL | @ 15:00:00

## 2025-01-06 MED ADMIN — cloBAZam (ONFI) split tablet 5 mg: 5 mg | ORAL | @ 18:00:00

## 2025-01-06 MED ADMIN — cloBAZam (ONFI) split tablet 5 mg: 5 mg | ORAL | @ 06:00:00 | Stop: 2025-01-06

## 2025-01-06 MED ADMIN — lactated Ringers infusion: 50 mL/h | INTRAVENOUS | @ 21:00:00 | Stop: 2025-01-07

## 2025-01-06 MED ADMIN — triamcinolone (KENALOG) 0.1 % ointment: TOPICAL | @ 15:00:00

## 2025-01-06 MED ADMIN — apixaban (ELIQUIS) tablet 5 mg: 5 mg | ORAL | @ 01:00:00

## 2025-01-06 MED ADMIN — apixaban (ELIQUIS) tablet 5 mg: 5 mg | ORAL | @ 15:00:00

## 2025-01-06 MED ADMIN — lactated ringers bolus 500 mL: 500 mL | INTRAVENOUS | @ 03:00:00 | Stop: 2025-01-05

## 2025-01-06 MED ADMIN — lactated Ringers infusion: 100 mL/h | INTRAVENOUS | @ 09:00:00 | Stop: 2025-01-06

## 2025-01-06 MED ADMIN — lactated ringers bolus 1,000 mL: 1000 mL | INTRAVENOUS | @ 01:00:00 | Stop: 2025-01-05

## 2025-01-06 MED ADMIN — lactated Ringers infusion: 75 mL/h | INTRAVENOUS | @ 08:00:00 | Stop: 2025-01-06

## 2025-01-06 MED ADMIN — lactated Ringers infusion: 75 mL/h | INTRAVENOUS | @ 03:00:00 | Stop: 2025-01-06

## 2025-01-06 MED ADMIN — hydrocortisone 2.5 % ointment 1 Application: 1 | TOPICAL | @ 15:00:00

## 2025-01-06 MED ADMIN — lactated ringers bolus 1,000 mL: 1000 mL | INTRAVENOUS | @ 09:00:00 | Stop: 2025-01-06

## 2025-01-06 MED ADMIN — sertraline (ZOLOFT) tablet 50 mg: 50 mg | ORAL | @ 15:00:00

## 2025-01-06 NOTE — H&P (Signed)
 Nephrology ([facility]) History & Physical    Assessment & Plan:   Ryan Davenport is a 64 y.o. male who is presenting to [facility] with AKI (acute kidney injury), in the setting of the following pertinent/contributing co-morbidities: HIV on ART, HTN, CKD 3, several VTE on Eliquis , type II DM .    Principal Problem:    AKI (acute kidney injury)  Active Problems:    Pulmonary embolism 1/12, long-term anticoag per hematology    Chronic diastolic heart failure (CMS-HCC)    Human immunodeficiency virus (HIV) disease    (CMS-HCC)    Depression    Type 2 diabetes mellitus without complication, with long-term current use of insulin  (CMS-HCC)    Obesity (BMI 30.0-34.9)    Diarrhea of presumed infectious origin        Active Problems     Oliguric AKI on CKD Stage 3 - Acute Diarrhea - Metabolic Acidosis  65 year old male with a past medical history of HIV on ART, HTN, CKD 3, several VTE on Eliquis , type II DM presents to [facility] ED from internal medicine office due to concerns of hypotension - was 70's systolic in [facility] 1/13. Patient reports he has had approx 2 weeks of severe diarrhea up to 10x per day of loose watery stools, and has not been taking his medications. Of note patient is difficult historian and at another time told me he wasn't taking his meds due to being depressed. Baseline creatinine around 1.3-1.4 and was found to have severe AKI with Cr. To 7.09 in ED. Patient reports no urine output in last 24 hours, c/f severe oliguric AKI. No indications for renal replacement therapy currently. He has a hx of notable proteinuria and trace blood on previous UA.  Previous UACR of 976 on 06/03/2024. No previous nephrology records however suspect CKD secondary from prolonged DM, HTN, and possibly HIV. Acute injury is likely in the setting of tubular injury from prolonged hypotension from poor oral intake and diarrhea/ acute infection.  Per nephrology ED consult history of proteinuria/hematuria with HIV history, GN could also be considered-- be infectious related, ANCA etc.  Renal US  without evidence of obstruction. S/p 2L bolus in ED, started on maintenance fluids as well. Will admit and proceed with the following:  -CBC, RFP daily  -Strict I/O - if no urine output in next few hours then re-bolus, consider foley placement  -UA, UACR/UPCR when able  -Avoid nephrotoxic agents  -Hold antihypertensives, jardiance   -GIPP, C. Diff stool studies  -consider CMV stool testing although lower suspicion  -continue maintenance fluids 100ml/hr x12 hours.     HIV  Follows with [facility]. Most recent CD4 count mildly reduced to 484 12/25. HIV detectable 39 copies. Patient reports taking injectable ART with cabenuva  q8 weeks which I see on his MAR, last appt 12/4 next scheduled for 2/4., however most recent [facility] ID note documents that he is on Biktarvy .   -Clarify with ID whether he should be on Biktarvy  daily while inpatient    Atopic Dermatitis  On Dupixent , triamcinolone  cream, hydrocortisone  cream, and cetirizine . Reports worsening eruption in last week worst on right arm and hand, has had some bleeding lesions. On exam it appears pretty severe with many scaly keratotic lesions on his extremities  -Continue home Kenalog  + Cetirizine   -Consider dermatology consult as appears pretty uncontrolled    HX VTE on Eliquis   several VTE on Eliquis  previously  -Continue Apixaban  5mg  bid    Depression - Anxiety  -Continue home  sertraline   -ordered home Clobazam  5mg  as PRN bid    Hx Seizures  -Continue home Keppra  1500mg  BID    Elevated Gamma Gap  Noted patient's total protein elevated to 10.5 with albumin 2.5, elevated gamma gap. May be 2/2 CKD, consider SPEP/UPEP    T2DM  Last A1c well controlled 6.1%. On Jardiance  only  -Glucose checks ACHS  -Start SSI if needed    The patient's presentation is complicated by the following clinically significant conditions requiring additional evaluation and treatment: - Disorders of electrolytes, volume status, and acid/base status: - Acidosis requiring further investigation, treatment, or monitoring        Issues Impacting Complexity of Management:  -The patient is at high risk for the development of complications of volume overload due to the need to provide IV hydration for suspected hypovolemia in the setting of: stage IV or greater CKD        Checklist:  Diet: Regular Diet  DVT PPx: Patient Already on Full Anticoagulation with apixaban   Code Status: Full Code  Dispo: Patient appropriate for Inpatient based on expectation of ongoing need for hospitalization greater than two midnights based on severity of presentation/services including severe oliguric AKI    Team Contact Information:   Primary Team: Nephrology ([facility])  Primary Resident: Belvie Ferries, MD  Resident's Pager: [phone] (Nephrology Intern - [place])    Chief Concern:   AKI (acute kidney injury)      Subjective:   64 year old male with a past medical history of HIV on ART, HTN, CKD 3, several VTE on Eliquis , type II DM presents to [facility] ED from internal medicine office due to concerns of hypotension and severe AKI        History obtained by patient at bedside.       HPI:      64 year old male with a past medical history of HIV on antiretrovirals followed by ID, focal seizures on keppra , type 2 diabetes, VTE and chronic Eliquis , atopic dermatitis who presents from his primary care physician's office for low blood pressure in the setting of flulike illness and rash on extremities for 1 week and intermittent chest pain (none currently)     History of Present Illness  Ryan Davenport is a 64 year old male who presents with decreased oral intake and medication non-compliance. He is accompanied by his mother.    He has not been eating or taking his medications for the past week, attributing this to feeling unwell, possibly due to a flu-like illness. He mentions experiencing stomach pain and headaches. He resumed taking his medication on Sunday after attending church.    He describes a new skin rash that appeared last Friday, which he associates with taking very hot showers. The rash has resulted in open wounds, and his skin becomes hard and scaly intermittently.    He has been experiencing diarrhea for the past two to three months, with episodes occurring approximately ten times a day, including at night. The stools are watery and black. No vomiting is reported.    He mentions not urinating in the past 24 hours, although he initially stated he was urinating normally. He later clarified that he has been urinating every time he goes to the bathroom, which is about ten times a day, for the past two weeks.    He is on injectable HIV medication and is due for his next dose on February 4th. He has not been consistently taking his other medications, missing doses from Monday to Friday  last week, but took them on Sunday.    No shortness of breath, abdominal pain, or swelling in his legs.    In the ED:  Vitals: Temp afeb, HR 96, BP 78/56--->120's/70's after 1.5L bolus, RR nl, SpO2 of 96% on RA  Labs: Cr elevated 7.06, Bicarb 16, Hb 11.8.   Cultures: none  EKG: NSR  Imaging: CXR neg, US  renal unremarkable, underdistended bladder  Interventions: 2L bolus, started maintenance fluids 75 ml/hr, Bicarb 650mg  tablet,         Pertinent Surgical Hx  N/a    Pertinent Family Hx  noncontributory    Pertinent Social Hx   Lives with     Allergies  Latex, Shellfish containing products, Metformin , and Penicillins    I reviewed the Medication List. There is Uncertainty about the medications the patient is currently taking.    Prior to Admission medications   Medication Dose, Route, Frequency   atorvastatin  (LIPITOR ) 40 MG tablet 40 mg, Oral, Daily (standard)   betamethasone  dipropionate 0.05 % cream 1 Application, Topical, 2 times a day (standard)   BIKTARVY  50-200-25 mg tablet 1 tablet, Oral, Daily (standard)   blood sugar diagnostic (GLUCOSE BLOOD) Strp Use to check blood sugar as directed with insulin  3 times a day & for symptoms of high or low blood sugar.   blood-glucose meter kit Use as instructed   cabotegravir -rilpivirine  (CABENUVA ) 600 mg-900 mg/3 mL extended-release injection 6 mL, Intramuscular, Every 30 days   cabotegravir -rilpivirine  (CABENUVA ) 600 mg-900 mg/3 mL extended-release injection 6 mL, Intramuscular, every 8 weeks   ciclopirox  (PENLAC ) 8 % solution Topical, Nightly, Apply over nail and surrounding skin. Apply daily over previous coat. After seven (7) days, may remove with alcohol  and continue cycle.   cloBAZam  (ONFI ) 10 mg Tab Take 0.5 (one-half) tablet (5 mg total) by mouth two (2) times a day.   colchicine  (COLCRYS ) 0.6 mg tablet TAKE 1 TABLET BY MOUTH DAILY AS NEEDED FOR GOUT   cyclobenzaprine  (FLEXERIL ) 5 MG tablet 5 mg   dupilumab  (DUPIXENT  SYRINGE) 300 mg/2 mL syringe Inject the contents of 1 syringe (300 mg total) under the skin every fourteen (14) days. For maintenance dose   ELIQUIS  5 mg Tab 5 mg, Oral, 2 times a day (standard)   empagliflozin  (JARDIANCE ) 25 mg tablet 25 mg, Oral, Daily (standard)   empty container Misc Use as directed to dispose for Dupixent  pens.   furosemide  (LASIX ) 20 MG tablet 1 tablet, Daily PRN   gabapentin  (NEURONTIN ) 100 MG capsule 100 mg, Oral, 3 times a day (standard)   hydroCHLOROthiazide 12.5 MG tablet    hydrocortisone  2.5 % ointment 1 Application, Topical, 2 times a day (standard), To active areas of the face until smooth   hydrOXYzine  (ATARAX ) 25 MG tablet 25 mg, Every 6 hours PRN   KLOR-CON  M15 15 mEq tablet    lancets Misc Use to check blood sugar as directed with insulin  3 times a day & for symptoms of high or low blood sugar.   levETIRAcetam  (KEPPRA ) 750 MG tablet 1,500 mg, Oral, 2 times a day (standard)   lisinopril  (PRINIVIL ,ZESTRIL ) 10 MG tablet 10 mg, Oral, Daily (standard)   methocarbamoL  (ROBAXIN ) 750 MG tablet 750mg  4x daily prn muscle spasms   metoPROLOL  succinate (TOPROL -XL) 25 MG 24 hr tablet 12.5 mg, Daily (standard)   omeprazole  (PRILOSEC) 40 MG capsule 40mg  BID   pen needle, diabetic 32 gauge x 5/32 (4 mm) Ndle Use with insulin  up to 4 times/day as needed.   sertraline  (  ZOLOFT ) 50 MG tablet 50 mg, Oral, Daily (standard)   triamcinolone  (KENALOG ) 0.1 % ointment Topical, 2 times a day (standard), To active areas of trunk and extremities until smooth   triamcinolone  (KENALOG ) 0.1 % ointment Apply the medication twice daily to affected areas of the skin until smooth. Then stop and re-start as soon as the skin changes come back.   citalopram  (CELEXA ) 40 MG tablet 40 mg, Oral, Daily (standard)       Librarian, Academic:  Mr. Campi currently has decisional capacity for healthcare decision-making and is able to designate a surrogate healthcare decision maker. Mr. Groleau designated healthcare decision maker(s) is/are Whyatt Klinger (the patient's parent) as denoted by stated patient preference.    Objective:   Physical Exam:  Temp:  [36.4 ??C (97.6 ??F)-36.7 ??C (98.1 ??F)] 36.4 ??C (97.6 ??F)  Pulse:  [86-105] 96  SpO2 Pulse:  [86-103] 94  Resp:  [14-22] 16  BP: (74-148)/(38-77) 148/66  SpO2:  [95 %-100 %] 99 %    Gen: NAD, converses   Eyes: Sclera anicteric, EOMI grossly normal   HENT: Atraumatic, normocephalic  Neck: Trachea midline  Heart: RRR  Lungs: CTAB, no crackles or wheezes  Abdomen: Soft, NTND  Extremities: No edema  Neuro: Grossly symmetric, non-focal    Skin:  Diffuse keratotic patches and nodules, worst on right hand and wrist with multiple areas of skin breakage .  Psych: Alert, oriented      Belvie Ferries, MD  Internal Medicine PGY-2

## 2025-01-06 NOTE — Consults (Signed)
 Vancomycin Therapeutic Monitoring Pharmacy Note    Ryan Davenport is a 64 y.o. male starting vancomycin. Date of therapy initiation: 01/07/25    Indication: Bloodstream infection    Prior Dosing Information: Vancomycin 2000 mg x 1     Goals:  Therapeutic Drug Levels  Vancomycin trough goal: < 20 mg/L    Results: Not applicable    Wt Readings from Last 1 Encounters:   01/06/25 (!) 103 kg (227 lb)     Creatinine   Date Value Ref Range Status   01/06/2025 6.49 (H) 0.73 - 1.18 mg/dL Final   98/86/7973 2.90 (H) 0.73 - 1.18 mg/dL Final   88/94/7974 8.47 (H) 0.73 - 1.18 mg/dL Final      Pharmacokinetic Considerations: Adult (estimated initial): Vd = 73.13 L, ke = 0.0144 hr-1    Assessment/Plan:  Recommendation(s)  Dosing by levels  Estimated trough on recommended regimen: Not applicable - dosing by level    Follow-up  Level due: tomorrow with AM labs  A pharmacist will continue to monitor and order levels as appropriate    Please contact service pharmacist with questions/clarifications.    Eason Housman, [facility]

## 2025-01-06 NOTE — Treatment Plan (Addendum)
 Nephrology (MEDB) Treatment Note    Assessment & Plan:   Ryan Davenport is a 64 y.o. male who is presenting to [facility] with AKI (acute kidney injury), in the setting of the following pertinent/contributing co-morbidities:    Principal Problem:    AKI (acute kidney injury)  Active Problems:    Pulmonary embolism 1/12, long-term anticoag per hematology    Chronic diastolic heart failure (CMS-HCC)    Human immunodeficiency virus (HIV) disease    (CMS-HCC)    Depression    Type 2 diabetes mellitus without complication, with long-term current use of insulin  (CMS-HCC)    Obesity (BMI 30.0-34.9)    Diarrhea of presumed infectious origin      Active Problems    Oliguric AKI on CKD Stage 3 - Acute Diarrhea - Metabolic Acidosis - ATN  Diarrhea for the past 2-3 months worsened for the past two weeks. Hypotensive on arrival (70s systolic). Baseline creatinine was 1 until November 2025 then 1.3-1.4. Cr of 7.09 on arrival. No urine in first 24 hours. Hx of notable proteinuria and trace blood on previous UA. Suspected CKD secondary to DM, HTN, and possibly HIV. Prenal/ATN. Muddy brown casts seen.   - UA, UACR, UPCR   - Continue mIVF   - Strict I/O  - GIPP, C.diff stool studies   - consider CMV stool testing although low suspicion   - Hold antihypertensives, jardiance    - CBC, RFP daily     Chronic Problems     HIV   Follows with [place] [facility]. Most recent CD4 count mildly reduced to 484 12/25. HIV detectable 39 copies. Consistent with injectable ART with cabenuva  q8 weeks; last 12/4. However still has Biktarvy  on med list and patient is poor historian. Clarified with ID he should be off Biktarvy .     Atopic Dermatitis   Worsening eruption in past week. Very itchy. Not consistent with home medications although reports they help. On Dupixent , triamcinolone  cream, hydrocortisone  cream, and cetirizine .   - Continue home Kenalog  and cetirizine    - Consider dermatology consult as appears pretty uncontrolled     Hx VTE on Eliquis  Several VTE on Eliquis  previously.   - Continue Apixaban  5 mg BID     Depression - Anxiety   - Continue home sertraline      Hx seizures   - Continue home Keppra  1500 mg BID   - Continue home Clobazam  5 mg BID     Elevated gamma gap   Noted patient's total protein elevated to 10.5 with albumin 2.5, elevated gamma gap. May be secondary to CKD  - Consider SPEP/UPEP    T2DM   Last A1c well controlled 6.1%. On Jardiance  but not consistent with medication. BG well controlled during hospitalization.  - Glucose checks ACHS   - Start SSI if needed     The patient's presentation is complicated by the following clinically significant conditions requiring additional evaluation and treatment: - Disorders of electrolytes, volume status, and acid/base status: - Acidosis requiring further investigation, treatment, or monitoring  - Chronic kidney disease POA requiring further investigation, treatment, or monitoring              Daily Checklist:  Diet: Regular Diet  DVT PPx: Patient Already on Full Anticoagulation with Eliquis   Electrolytes: Replete Potassium to >/= 3.6 and Magnesium  to >/= 1.8  Code Status: Full Code  Dispo: Home    Team Contact Information:   Primary Team: Nephrology (MEDB)  Primary Resident: Almedia Dare, MD  Resident's Pager:  [phone] (Nephrology Intern - [place])    Interval History:   No acute events overnight. Urinating improved and had 700 mL output this morning. Still having diarrhea with two episodes already this morning.      All other systems were reviewed and are negative except as noted in the HPI    Objective:   Temp:  [36.3 ??C (97.3 ??F)-36.7 ??C (98.1 ??F)] 36.3 ??C (97.3 ??F)  Pulse:  [86-105] 96  SpO2 Pulse:  [86-103] 97  Resp:  [14-22] 16  BP: (78-148)/(38-77) 140/66  SpO2:  [95 %-100 %] 99 %    Gen: NAD, converses   HENT: atraumatic, normocephalic  Heart: RRR  Lungs: CTAB, no crackles or wheezes  Abdomen: soft, NTND  Extremities: No edema  Skin: Maculopapular rash on bilateral arms and chest with dry bandages.     Signature(s):     Sarah Rebbeor, MS3     I attest that I have reviewed the medical student note and that the components of the history of the present illness, the physical exam, and the assessment and plan documented were performed by me or were performed in my presence by the student where I verified the documentation and performed (or re-performed) the exam and medical decision making. Houa Ackert SHAUNNA Dare, MD    Almedia Dare, MD  PGY-1, Internal Medicine  [facility]

## 2025-01-06 NOTE — Hospital Course (Addendum)
 Outpatient Provider Follow Up Issues:   - Follow up on high uric acid and elevated gamma gap   - Follow up on blood cultures, fecal elastase, and surgical pathology   - Repeat labs next week at PCP: CBC, BMP  - Discuss with PCP when to restart lisinopril  and hydrochlorothiazide     Brief Hospital Course:   Ryan Davenport is a 64 year old male with history of CHF, HIV, T2DM, obesity, and CKD who presents for AKI in the setting of worsening diarrhea.     Detailed Hospital Course By Problem:     Oliguric AKI on CKD Stage 3 - Acute Diarrhea - Metabolic Acidosis - ATN  Diarrhea for the past 2-3 months worsened for the past two weeks. Hypotensive on arrival (70s systolic). Baseline creatinine was 1 until November 2025 then 1.3-1.4. Cr of 7.09 on arrival. No urine in first 24 hours. Hx of notable proteinuria and trace blood on previous UA. Muddy brown casts seen. Suspected CKD secondary to DM, HTN, and possibly HIV. Prenal/ATN this admission. Replaced fluids and urination improved. For diarrhea considering infectious causes vs malabsorption. GI pathogen panel and c.diff negative. 1 out of 2 blood culture positive for staph hominis; thought to be a contaminant. Given one time dose of vancomycin . Repeat blood cultures pending on discharge. Follow up results with PCP. Diagnostic colonoscopy 1/16 with a few small polyps (not resected due to anticoagulation) no obvious other abnormality. Surgical path pending on discharge to follow up with GI outpatient on that and his fecal elastase value. Overall, diarrhea has resolved and patient is feeling better. He is appropriate for outpatient follow up. Creatinine on discharge much improved at 2.29. He continues on oral sodium bicarb for acidosis. He will have repeat labs next week with PCP and outpatient GI follow up.     GLIM Moderate Malnutrition   Involuntary weight loss >5-10% in 6 months due to reduced food intake from chronic GI condition. Appetite improved inpatient.     Chronic problems     HIV   Follows with [place] ID. Most recent CD4 count mildly reduced to 484 12/25. HIV detectable 39 copies. Consistent with injectable ART with cabenuva  q8 weeks; last 12/4. However still has Biktarvy  on med list and patient is poor historian. Clarified with ID he should be off Biktarvy .      Atopic Dermatitis   Worsening eruption in past week. Very itchy. Not consistent with home medications although reports they help. Continued home Dupixent , triamcinolone  cream, hydrocortisone  cream, and cetirizine . WOCN teaching for hand wound maintenance provided prior to discharge. Anticipate following up with PCP and discuss dermatology referral at this time.     Hx VTE on Eliquis    Several VTE on Eliquis  previously. Continued home Apixaban  5 mg BID.      Depression - Anxiety   Continued home sertraline .      Hx seizures   Continued home Keppra  1500 mg BID and Clobazam  5 mg BID.      Elevated gamma gap   Noted patient's total protein elevated to 10.5 with albumin 2.5, elevated gamma gap. Likely secondary to CKD. Could consider SPEP/UPEP but no other symptoms such as hypercalcemia or back pain. He will follow up with PCP.      T2DM   On Jardiance  but not consistent with medication. BG well controlled during hospitalization. A1c 5.9% on 1/16.     Hypertension   On arrival was significantly hypotensive. Held lisinopril  10 mg and hydrochlorothiazide 12.5 mg. He will  follow up with outpatient provider to discuss when to restart these medications.     CHF   On metoprolol  12.5 mg outpatient. Held on admission given hypotension. He will restart at discharge.     Hyperlipidemia   He continued home atorvastatin  40 mg.

## 2025-01-07 LAB — RENAL FUNCTION PANEL
ALBUMIN: 2.3 g/dL — ABNORMAL LOW (ref 3.4–5.0)
ANION GAP: 14 mmol/L (ref 5–14)
BLOOD UREA NITROGEN: 38 mg/dL — ABNORMAL HIGH (ref 9–23)
BUN / CREAT RATIO: 10
CALCIUM: 8.4 mg/dL — ABNORMAL LOW (ref 8.7–10.4)
CHLORIDE: 112 mmol/L — ABNORMAL HIGH (ref 98–107)
CO2: 15 mmol/L — ABNORMAL LOW (ref 20.0–31.0)
CREATININE: 3.94 mg/dL — ABNORMAL HIGH (ref 0.73–1.18)
EGFR CKD-EPI (2021) MALE: 16 mL/min/1.73m2 — ABNORMAL LOW (ref >=60)
GLUCOSE RANDOM: 86 mg/dL (ref 70–179)
PHOSPHORUS: 4.9 mg/dL (ref 2.4–5.1)
POTASSIUM: 4 mmol/L (ref 3.4–4.8)
SODIUM: 141 mmol/L (ref 135–145)

## 2025-01-07 LAB — CBC
HEMATOCRIT: 32.5 % — ABNORMAL LOW (ref 39.0–48.0)
HEMOGLOBIN: 10.8 g/dL — ABNORMAL LOW (ref 12.9–16.5)
MEAN CORPUSCULAR HEMOGLOBIN CONC: 33.3 g/dL (ref 32.0–36.0)
MEAN CORPUSCULAR HEMOGLOBIN: 28.5 pg (ref 25.9–32.4)
MEAN CORPUSCULAR VOLUME: 85.7 fL (ref 77.6–95.7)
MEAN PLATELET VOLUME: 7.6 fL (ref 6.8–10.7)
PLATELET COUNT: 314 10*9/L (ref 150–450)
RED BLOOD CELL COUNT: 3.8 10*12/L — ABNORMAL LOW (ref 4.26–5.60)
RED CELL DISTRIBUTION WIDTH: 15.8 % — ABNORMAL HIGH (ref 12.2–15.2)
WBC ADJUSTED: 6.4 10*9/L (ref 3.6–11.2)

## 2025-01-07 LAB — URIC ACID: URIC ACID: 12.4 mg/dL — ABNORMAL HIGH (ref 3.7–9.2)

## 2025-01-07 MED ADMIN — sodium bicarbonate tablet 650 mg: 650 mg | ORAL | @ 14:00:00 | Stop: 2025-01-10

## 2025-01-07 MED ADMIN — sodium bicarbonate tablet 650 mg: 650 mg | ORAL | @ 18:00:00 | Stop: 2025-01-10

## 2025-01-07 MED ADMIN — sodium bicarbonate tablet 650 mg: 650 mg | ORAL | @ 02:00:00 | Stop: 2025-01-10

## 2025-01-07 MED ADMIN — levETIRAcetam (KEPPRA) tablet 1,500 mg: 1500 mg | ORAL | @ 14:00:00

## 2025-01-07 MED ADMIN — levETIRAcetam (KEPPRA) tablet 1,500 mg: 1500 mg | ORAL | @ 02:00:00

## 2025-01-07 MED ADMIN — atorvastatin (LIPITOR) tablet 40 mg: 40 mg | ORAL | @ 14:00:00

## 2025-01-07 MED ADMIN — cloBAZam (ONFI) split tablet 5 mg: 5 mg | ORAL | @ 03:00:00

## 2025-01-07 MED ADMIN — cloBAZam (ONFI) split tablet 5 mg: 5 mg | ORAL | @ 14:00:00

## 2025-01-07 MED ADMIN — peg-electrolyte soln (GoLYTELY) solution 2,000 mL: 2000 mL | ORAL | @ 22:00:00 | Stop: 2025-01-08

## 2025-01-07 MED ADMIN — lactated Ringers infusion: 50 mL/h | INTRAVENOUS | @ 02:00:00 | Stop: 2025-01-07

## 2025-01-07 MED ADMIN — triamcinolone (KENALOG) 0.1 % ointment: TOPICAL | @ 14:00:00

## 2025-01-07 MED ADMIN — triamcinolone (KENALOG) 0.1 % ointment: TOPICAL | @ 03:00:00

## 2025-01-07 MED ADMIN — apixaban (ELIQUIS) tablet 5 mg: 5 mg | ORAL | @ 02:00:00

## 2025-01-07 MED ADMIN — apixaban (ELIQUIS) tablet 5 mg: 5 mg | ORAL | @ 14:00:00

## 2025-01-07 MED ADMIN — hydrocortisone 2.5 % ointment 1 Application: 1 | TOPICAL | @ 14:00:00

## 2025-01-07 MED ADMIN — hydrocortisone 2.5 % ointment 1 Application: 1 | TOPICAL | @ 03:00:00

## 2025-01-07 MED ADMIN — vancomycin (VANCOCIN) 2000 mg in sodium chloride (NS) 0.9% 500 mL IVPB: 2000 mg | INTRAVENOUS | @ 05:00:00

## 2025-01-07 MED ADMIN — sertraline (ZOLOFT) tablet 50 mg: 50 mg | ORAL | @ 14:00:00

## 2025-01-07 NOTE — Plan of Care (Signed)
 Shift Summary  Wound care was performed with dressing change and barrier film application for skin integrity concerns.    Renal function panel and uric acid results indicated persistent renal impairment and electrolyte abnormalities.    Oral intake and urine output were maintained, supporting ongoing fluid balance.    peg-electrolyte solution was administered to address gastrointestinal needs.    Overall, the shift was marked by stable vital signs and continued management of renal and skin health.     Absence of Infection Signs and Symptoms: Temperature remained mildly elevated but stable, and C. difficile PCR screen was negative; no new abnormal findings in WBC or other CBC parameters were noted during the shift.     Skin Health and Integrity: Rash and scaling were present on skin assessment, and wound care was performed with dressing change and barrier film; peri-wound maceration and mild odor were noted at the wound site.     Fluid and Electrolyte Balance: Renal panel showed persistent electrolyte abnormalities including low CO2, low calcium, and high chloride, with ongoing oral intake and urine output maintained throughout the shift.     Effective Renal Function: Renal function panel revealed elevated BUN, creatinine, and uric acid, with low eGFR and albumin, and urine output was large and voiding continued; SOFA score remained unchanged.     Effective Diarrhea Management: peg-electrolyte solution was administered late in the shift, and no new diarrhea-related complications were documented.

## 2025-01-07 NOTE — Progress Notes (Signed)
 Nephrology (MEDB) Treatment Note    Assessment & Plan:   Ryan Davenport is a 64 y.o. male who is presenting to [facility] with AKI (acute kidney injury), in the setting of the following pertinent/contributing co-morbidities:    Principal Problem:    AKI (acute kidney injury)  Active Problems:    Pulmonary embolism 1/12, long-term anticoag per hematology    Chronic diastolic heart failure (CMS-HCC)    Human immunodeficiency virus (HIV) disease    (CMS-HCC)    Depression    Type 2 diabetes mellitus without complication, with long-term current use of insulin  (CMS-HCC)    Obesity (BMI 30.0-34.9)    Diarrhea of presumed infectious origin      Active Problems    Oliguric AKI on CKD Stage 3 - Acute Diarrhea - Metabolic Acidosis - ATN  Diarrhea for the past 2-3 months worsened for the past two weeks. Hypotensive on arrival (70s systolic). Baseline creatinine was 1 until November 2025 then 1.3-1.4. Cr of 7.09 on arrival. No urine in first 24 hours. Hx of notable proteinuria and trace blood on previous UA. Muddy brown casts seen. Suspected CKD secondary to DM, HTN, and possibly HIV. Prenal/ATN this admission. Replaced fluids and urination improved. Cr with large improvement to 3.94. For diarrhea considering infectious causes vs malabsorption. GI pathogen panel and c.diff negative.  - Consulted GI; appreciate recs   - Strict I/O  - consider CMV stool testing although low suspicion   - Hold antihypertensives, jardiance    - CBC, RFP daily     C/f Infection  Patient febrile 1/14 overnight Tmax 100.4. He feels about the same, no localizing/new symptoms. WBC wnl. Blood cultures with 1/2 positive for Staph. Will begin empiric treatment and continue to follow.  - Continue Vancomycin  (1/14 - )  - Bcx x2 (1/13):   - 1/2: (+) GPC, Staph species     Atopic Dermatitis   Worsening eruption in past week. Very itchy. Not consistent with home medications although reports they help. On Dupixent , triamcinolone  cream, hydrocortisone  cream, and cetirizine .   - Continue home Kenalog  and cetirizine    - Consider dermatology consult as appears pretty uncontrolled   - WOCN consulted, appreciate recs    Chronic Problems     HIV   Follows with [place] ID. Most recent CD4 count mildly reduced to 484 12/25. HIV detectable 39 copies. Consistent with injectable ART with cabenuva  q8 weeks; last 12/4. However still has Biktarvy  on med list and patient is poor historian. Clarified with ID he should be off Biktarvy .     Hx VTE on Eliquis    Several VTE on Eliquis  previously.   - Continue Apixaban  5 mg BID     Depression - Anxiety   - Continue home sertraline      Hx seizures   - Continue home Keppra  1500 mg BID   - Continue home Clobazam  5 mg BID     Elevated gamma gap   Noted patient's total protein elevated to 10.5 with albumin 2.5, elevated gamma gap. Likely secondary to CKD. Could consider SPEP/UPEP but no other symptoms such as hypercalcemia or back pain.     T2DM   Last A1c well controlled 6.1%. On Jardiance  but not consistent with medication. BG well controlled during hospitalization.  - Glucose checks ACHS   - Start SSI if needed     The patient's presentation is complicated by the following clinically significant conditions requiring additional evaluation and treatment: - Disorders of electrolytes, volume status, and acid/base status: -  Acidosis requiring further investigation, treatment, or monitoring  - Chronic kidney disease POA requiring further investigation, treatment, or monitoring              Daily Checklist:  Diet: Regular Diet  DVT PPx: Patient Already on Full Anticoagulation with Eliquis   Electrolytes: Replete Potassium to >/= 3.6 and Magnesium  to >/= 1.8  Code Status: Full Code  Dispo: Home    Team Contact Information:   Primary Team: Nephrology (MEDB)  Primary Resident: Almedia Dare, MD  Resident's Pager: [phone] (Nephrology Intern - [place])    Interval History:   Patient had a fever overnight (100.4) that has now resolved. Overall reports he is doing better and that diarrhea is starting to slow. Mentions one episode of diarrhea after eating dinner last night and none this morning. No fevers currently. Urinating with no problem. Denies any abdominal pain. Able to eat.     All other systems were reviewed and are negative except as noted in the HPI    Objective:   Temp:  [36.7 ??C (98.1 ??F)-38 ??C (100.4 ??F)] 36.7 ??C (98.1 ??F)  Pulse:  [108-113] 108  SpO2 Pulse:  [107] 107  Resp:  [18-19] 18  BP: (125-158)/(57-76) 150/76  SpO2:  [96 %-100 %] 98 %    Gen: NAD, converses   HENT: atraumatic, normocephalic  Heart: RRR  Lungs: CTAB, no crackles or wheezes  Abdomen: soft, NTND  Extremities: No edema  Skin: Maculopapular rash on bilateral arms and chest with dry bandages.     Signature(s):     Sarah Rebbeor, MS3     I attest that I have reviewed the medical student note and that the components of the history of the present illness, the physical exam, and the assessment and plan documented were performed by me or were performed in my presence by the student where I verified the documentation and performed (or re-performed) the exam and medical decision making. Farron Lafond SHAUNNA Dare, MD    Almedia Dare, MD  PGY-1, Internal Medicine  [facility]

## 2025-01-07 NOTE — Consults (Signed)
 WOCN Consult Services                                                                 Wound Evaluation     Reason for Consult:   - Initial  - Wound    Problem List:   Principal Problem:    AKI (acute kidney injury)  Active Problems:    Pulmonary embolism 1/12, long-term anticoag per hematology    Chronic diastolic heart failure (CMS-HCC)    Human immunodeficiency virus (HIV) disease    (CMS-HCC)    Depression    Type 2 diabetes mellitus without complication, with long-term current use of insulin  (CMS-HCC)    Obesity (BMI 30.0-34.9)    Diarrhea of presumed infectious origin    Diarrhea    History: Ignazio Kincaid is a 64 y.o. male with a PMHx of HIV on cabenuva  (CD4 484, 11/26/24), seizures on keppra , cognitive impairment, anxiety/depression, CAD w/ prior MI s/p PCI, HFpEF, HTN, HLD, atopic dermatitis who presented to [facility] with low BP and rash of 1 wk.     Assessment: Consult to assess generalized rash. On assesment, there is extensive rash over the legs, abdomen and arms, that it is worse on the right arm, especially on the posterior surface of the right hand. Patient had placed a sock on the arm and hand so the tissue was stuck to the fabric, on removal, I removed top layer of the skin.     Wound base has thick, tan and sanguineous drainage surrounded by small pustules, papules and scabs. There is edema of the hand that is worst at the wrist level. Pain is 10/10 with palpation and wound care. Per patient, it started a week ago and he has covered the area with socks to protect from infection.     Dicussed with first call provider via secure chat: patient might benefit from Dermatology consult to determine etiology of the rash, but also consider a Plastic Surgery since the rash has caused edema and wounds to the right hand thus impeding articulation.    Wound 01/06/25 Irritant Contact Dermatitis Rash/Dermatitis Hand Lower;Posterior;Right unknown etiology for rash and open wounds (Active)   Properties   Placement Date 01/06/25   Placement Time 1930   Location Hand   Primary Wound Type Irritant Con   Secondary Wound Type - Irritant Contact Dermatitis Rash/Dermati (open sores on right arm that are bleeding)   Wound Location Orientation Lower;Posterior;Right   Wound Description (Comments) unknown etiology for rash and open wounds      Assessments 01/07/2025 12:36 PM   Wound Image       Dressing Status      Changed   Shape irregular   Peri-wound Assessment      Maceration (rash, bumps)   Odor Mild   Site Assessment Red;Tan   Treatments No sting barrier film   Dressing Changed Changed         Continence Status:   Continent    Moisture Associated Skin Damage:   - Incontinence-associated dermatitis (IAD)     Lab Results   Component Value Date    WBC 6.4 01/07/2025    HGB 10.8 (L) 01/07/2025    HCT 32.5 (L) 01/07/2025    ESR 24 (H) 11/20/2014  CRP 2.8 (H) 11/20/2014    A1C 6.1 07/14/2024    GLUF 99 02/23/2015    GLU 86 01/07/2025    POCGLU 111 01/06/2025    ALBUMIN 2.3 (L) 01/07/2025    PROT 10.5 (H) 01/05/2025     Support Surface:   - Low Air Loss    Offloading:  Left: Pillow  Right: Pillow    Teaching:  - Wound care    WOCN Recommendations:   - See nursing orders for wound care instructions.  - Contact WOCN with questions, concerns, or wound deterioration.    Topical Therapy/Interventions:   GLENWOOD Lederer    Recommended Consults:  - Dermatology  - Infectious Disease  - Plastic Surgery    WOCN Follow Up:  - We will sign off at this time    Plan of Care Discussed With:   - Patient  - RN [place], Blane KIDD, RN  - LIP Aana SHAUNNA Dare, MD    Supplies Ordered: No    Workup Time:   45 minutes    Conal Shetley  BSN, RN, CWON, CFCN  Wound, Ostomy Consult Team  [facility]

## 2025-01-07 NOTE — Plan of Care (Signed)
 Shift Summary  Vancomycin was administered following a positive blood culture for gram positive cocci in clusters.   Blood cultures and molecular assays were performed, with staphylococcus species detected.   Pain remained at 0 and comfort was maintained with frequent position changes and adequate nutrition.   Safety interventions were consistently implemented, and no falls or injuries occurred.   Overall, the patient remained comfortable and stable, with ongoing infection evaluation and management.     Optimal Comfort and Wellbeing: Pain was consistently rated at 0 throughout the shift, and no interventions for pain were required; patient remained comfortable in bed with frequent position changes and adequate nutrition documented.     Readiness for Transition of Care: Unplanned readmission score remained stable with only minimal increase, and cognition was intact with the ability to follow commands; IRF and LTACH candidacy scores were documented.     Absence of Infection Signs and Symptoms: Temperature remained stable at 38??C, and SpO2 improved; vancomycin was administered and blood cultures were drawn, with one culture showing gram positive cocci in clusters and a molecular assay detecting staphylococcus species.     Absence of Fall and Fall-Related Injury: Side rails were consistently up, hourly visual checks were performed, and no falls or injuries occurred during the shift; bed/chair alarm remained off and safety interventions were maintained.     Absence of Allergy Symptoms: Eczema was noted on skin assessment, but no acute allergic symptoms or interventions were documented.

## 2025-01-07 NOTE — Consults (Signed)
 [facility] Consult [facility]   Initial Consultation         Assessment and Recommendations:   Ryan Davenport is a 64 y.o. male with a PMHx of HIV on cabenuva  (CD4 484, 11/26/24), seizures on keppra , cognitive impairment, anxiety/depression, CAD w/ prior MI s/p PCI, HFpEF, HTN, HLD, atopic dermatitis who presented to [facility] with low BP and rash of 1 wk. The patient is seen in consultation at the request of Tanda Dorn Catching, MD (Nephrology (MD[facility])) for chronic diarrhea.    Chronic diarrhea  59M h/o HIV on ARV (CD4 484 12/25), seizures on keppra , CAD s/p PCI p/w AKI, rash and diarrhea. Reports diarrhea 3-4, ~10 ep/d, non bloody, no associated pain. Previously consulted for diarrhea 03/2024 with plan for outpatient colonoscopy that never got completed. Differential for diarrhea includes infectious source (CD4 >400, negative CDIFF/GIPP; low concern for microsporidia or parasites), microscopic colitis, malabsorption (EPI or HIV related enteropathy [less c/f given CD4 level], other medication related diarrhea (no obvious culprits on review), BAM (has GB), or SIBO (no bloating/cramping). Very low concern for IBD. Recommend further stool test, TSH, and colonoscopy.  - plan for colonoscopy 1/16 if prepped   <> on eliquis , will not be able to resect large polyps, but OK for diagnostic colonoscopy  - split prep as below  - CLD now  - NPO at midnight   - please obtain fecal elastase and TSH    GI Pre-Procedure Checklist  Procedure: Colonoscopy  Anticipated Date of Procedure: 1/16  Anticoagulants/Antiplatelets: eliquis   Anesthesia Concerns: None  Diet: Order clear liquid and make NPO at 12AM (midnight) day of procedure  Prep: Order SPLIT DOSING Golytely bowel prep this afternoon. Order bowel prep using Prep for COLONOSCOPY order-set. The patient is adequately prepped when their stool is clear and yellow, like urine. Continue to order bowel prep until the patients stools are clear. The patient can continue drinking prep past midnight if not clear, but must be strictly NPO of all oral intake for at least 2 hours before procedure.    Issues Impacting Complexity of Management:  -None    Recommendations discussed with the patient's primary team. We will continue to follow along with you.    Subjective:     59M h/o HIV on ARV, T2DM (last A1c 6.1%), CAD, atopic dermatitis, presenting with body rash, AKI, and diarrhea.     Reports diarrhea persistently >10/day last 3-4 months. Says some days will have normal stool, but unable to quantify. Persistently watery diarrhea the last few months. Denies improvement in diarrhea after stopping biktarvy . Denies change in diarrhea after starting cabenuva  q8wk (10/28/24).     Reports some of the diarrhea has been black, no bright red blood, no associated abdominal pain.    Last GI clinic visit 06/12/24 for follow up of inpatient diarrhea 03/2024, resolved. At that time felt to be due to PO medications. Patient declined outpatient cscope with [facility] and was going to get one closer to home. Reports that he attempted to but they did not have a referral in place so has not had colonoscopy since ~2014?    This hospitalization patient found to have GPC bacteremia, on vancomycin IV. AKI treated with mIVF, holding anti-HTN and jardiance . Topical steroid for atopic dermatitis.     Wt Readings from Last 12 Encounters:   01/06/25 (!) 103 kg (227 lb)   01/05/25 (!) 104.2 kg (229 lb 12.8 oz)   11/26/24 (!) 110.2 kg (243 lb)   10/28/24 (!) 113 kg (  249 lb 3.2 oz)   10/14/24 (!) 110 kg (242 lb 9.6 oz)   09/15/24 (!) 113.4 kg (250 lb)   08/26/24 (!) 108.9 kg (240 lb)   08/11/24 (!) 109.4 kg (241 lb 4 oz)   07/22/24 (!) 104.5 kg (230 lb 6.4 oz)   07/14/24 (!) 103 kg (227 lb)   06/12/24 (!) 103 kg (227 lb)   06/12/24 (!) 102.3 kg (225 lb 9.6 oz)     -I have reviewed the patient's prior records from this admission as summarized in the HPI    Objective:   Temp:  [36.7 ??C (98.1 ??F)-38 ??C (100.4 ??F)] 36.7 ??C (98.1 ??F)  Pulse:  [108-113] 108  Resp:  [18-19] 18  BP: (125-150)/(57-76) 150/76  SpO2:  [96 %-100 %] 98 %    Gen: male in NAD, answers questions appropriately  Abdomen: Soft, NTND  Extremities: No edema in the BLEs    Pertinent Labs & Studies:  -I have reviewed the patient's labs from 01/07/2025 which show stable Hgb and improving renal function (SCr)      Labs and Imaging:__________________  __________________________________    Janna 01/06/25 - negative  GIPP 01/06/25 - negative    Endoscopies:______________________  __________________________________    Colonoscopy 11/11/2013  Impression:        - The entire examined colon is normal on direct and retroflexion views.  Recommendation:    - Repeat colonoscopy in 10 years for screening purposes.

## 2025-01-08 LAB — CBC
HEMATOCRIT: 34.6 % — ABNORMAL LOW (ref 39.0–48.0)
HEMOGLOBIN: 11.5 g/dL — ABNORMAL LOW (ref 12.9–16.5)
MEAN CORPUSCULAR HEMOGLOBIN CONC: 33.4 g/dL (ref 32.0–36.0)
MEAN CORPUSCULAR HEMOGLOBIN: 28.2 pg (ref 25.9–32.4)
MEAN CORPUSCULAR VOLUME: 84.5 fL (ref 77.6–95.7)
MEAN PLATELET VOLUME: 7.4 fL (ref 6.8–10.7)
PLATELET COUNT: 359 10*9/L (ref 150–450)
RED BLOOD CELL COUNT: 4.09 10*12/L — ABNORMAL LOW (ref 4.26–5.60)
RED CELL DISTRIBUTION WIDTH: 15.6 % — ABNORMAL HIGH (ref 12.2–15.2)
WBC ADJUSTED: 6.3 10*9/L (ref 3.6–11.2)

## 2025-01-08 LAB — RENAL FUNCTION PANEL
ALBUMIN: 2.6 g/dL — ABNORMAL LOW (ref 3.4–5.0)
ANION GAP: 11 mmol/L (ref 5–14)
BLOOD UREA NITROGEN: 27 mg/dL — ABNORMAL HIGH (ref 9–23)
BUN / CREAT RATIO: 10
CALCIUM: 8.9 mg/dL (ref 8.7–10.4)
CHLORIDE: 115 mmol/L — ABNORMAL HIGH (ref 98–107)
CO2: 17 mmol/L — ABNORMAL LOW (ref 20.0–31.0)
CREATININE: 2.68 mg/dL — ABNORMAL HIGH (ref 0.73–1.18)
EGFR CKD-EPI (2021) MALE: 26 mL/min/1.73m2 — ABNORMAL LOW (ref >=60)
GLUCOSE RANDOM: 98 mg/dL (ref 70–179)
PHOSPHORUS: 4 mg/dL (ref 2.4–5.1)
POTASSIUM: 3.9 mmol/L (ref 3.4–4.8)
SODIUM: 143 mmol/L (ref 135–145)

## 2025-01-08 LAB — PROCALCITONIN: PROCALCITONIN: 0.39 ng/mL — ABNORMAL HIGH (ref <0.25)

## 2025-01-08 LAB — TSH: THYROID STIMULATING HORMONE: 1.182 u[IU]/mL (ref 0.550–4.780)

## 2025-01-08 LAB — VANCOMYCIN, RANDOM: VANCOMYCIN RANDOM: 13.9 ug/mL

## 2025-01-08 MED ADMIN — sodium bicarbonate tablet 650 mg: 650 mg | ORAL | @ 02:00:00 | Stop: 2025-01-10

## 2025-01-08 MED ADMIN — sodium bicarbonate tablet 650 mg: 650 mg | ORAL | @ 18:00:00 | Stop: 2025-01-10

## 2025-01-08 MED ADMIN — levETIRAcetam (KEPPRA) tablet 1,500 mg: 1500 mg | ORAL | @ 02:00:00

## 2025-01-08 MED ADMIN — levETIRAcetam (KEPPRA) tablet 1,500 mg: 1500 mg | ORAL | @ 13:00:00

## 2025-01-08 MED ADMIN — lactated Ringers infusion: 10 mL/h | INTRAVENOUS | @ 14:00:00

## 2025-01-08 MED ADMIN — atorvastatin (LIPITOR) tablet 40 mg: 40 mg | ORAL | @ 18:00:00

## 2025-01-08 MED ADMIN — cloBAZam (ONFI) split tablet 5 mg: 5 mg | ORAL | @ 02:00:00

## 2025-01-08 MED ADMIN — cloBAZam (ONFI) split tablet 5 mg: 5 mg | ORAL | @ 18:00:00

## 2025-01-08 MED ADMIN — peg-electrolyte soln (GoLYTELY) solution 2,000 mL: 2000 mL | ORAL | @ 10:00:00 | Stop: 2025-01-08

## 2025-01-08 MED ADMIN — phenylephrine 1 mg/10 mL (100 mcg/mL) injection Syrg: INTRAVENOUS | @ 16:00:00 | Stop: 2025-01-08

## 2025-01-08 MED ADMIN — phenylephrine 1 mg/10 mL (100 mcg/mL) injection Syrg: INTRAVENOUS | @ 15:00:00 | Stop: 2025-01-08

## 2025-01-08 MED ADMIN — fentaNYL (PF) (SUBLIMAZE) injection: INTRAVENOUS | @ 15:00:00 | Stop: 2025-01-08

## 2025-01-08 MED ADMIN — triamcinolone (KENALOG) 0.1 % ointment: TOPICAL | @ 18:00:00

## 2025-01-08 MED ADMIN — Propofol (DIPRIVAN) injection: INTRAVENOUS | @ 15:00:00 | Stop: 2025-01-08

## 2025-01-08 MED ADMIN — Propofol (DIPRIVAN) injection: INTRAVENOUS | @ 16:00:00 | Stop: 2025-01-08

## 2025-01-08 MED ADMIN — apixaban (ELIQUIS) tablet 5 mg: 5 mg | ORAL | @ 18:00:00

## 2025-01-08 MED ADMIN — apixaban (ELIQUIS) tablet 5 mg: 5 mg | ORAL | @ 02:00:00

## 2025-01-08 MED ADMIN — multivitamins, therapeutic with minerals tablet 1 tablet: 1 | ORAL | @ 22:00:00

## 2025-01-08 MED ADMIN — lidocaine (PF) (XYLOCAINE-MPF) 20 mg/mL (2 %) injection: INTRAVENOUS | @ 15:00:00 | Stop: 2025-01-08

## 2025-01-08 MED ADMIN — acetaminophen (TYLENOL) tablet 650 mg: 650 mg | ORAL | @ 02:00:00

## 2025-01-08 MED ADMIN — hydrocortisone 2.5 % ointment 1 Application: 1 | TOPICAL | @ 18:00:00

## 2025-01-08 MED ADMIN — sertraline (ZOLOFT) tablet 50 mg: 50 mg | ORAL | @ 18:00:00

## 2025-01-08 NOTE — Plan of Care (Addendum)
 Shift Summary  Go Lytely given. Pt drank 2L starting at 0500 this morning and completed all by 0630. Pt did not flush with particles present. Pt stated that was from the first one this morning but the 3 following were clear. Provider notified and stated it was fine.  Acetaminophen  was administered for severe pain, but pain scores remained unchanged at 10 throughout the shift.   Blood culture was collected and showed no growth at 48 hours, and temperature remained normal.   Fall prevention strategies were maintained, including frequent visual checks and scheduled toileting, with no falls or injuries reported.   Skin assessments noted rash and scaling, but interventions for skin protection and pressure reduction were consistently applied.   Overall, pain control was not achieved, but infection and fall prevention goals were met, and skin integrity was supported.     Optimal Comfort and Wellbeing: Pain remained severe and unchanged throughout the shift, with aching pain in the right side and a pain score of 10 despite acetaminophen  administration; patient was able to sleep briefly during the shift.     Absence of Infection Signs and Symptoms: Temperature remained within normal limits and blood culture showed no growth at 48 hours.     Absence of Fall and Fall-Related Injury: Fall reduction program and safety interventions were maintained, with regular hourly visual checks and toileting every 2 hours; no falls or injuries occurred.     Optimal Pain Control and Function: Pain score remained at 10 with ongoing, rarely occurring aching pain in the right side; acetaminophen  was administered but did not result in improvement.     Skin Health and Integrity: Skin assessment revealed rash and scaling with exceptions to WDL, but Braden score was high and pressure reduction techniques and devices were consistently used.

## 2025-01-08 NOTE — Progress Notes (Signed)
 Nephrology ([facility]) Progress Note    Assessment & Plan:   Ryan Davenport is a 64 y.o. male who is presenting to [facility] with AKI (acute kidney injury), in the setting of the following pertinent/contributing co-morbidities: HIV on cabenuva  (CD4 484, 11/26/24), seizures on keppra , cognitive impairment, anxiety/depression, CAD w/ prior MI s/p PCI, HFpEF, HTN, HLD, atopic dermatitis     Principal Problem:    AKI (acute kidney injury)  Active Problems:    Pulmonary embolism 1/12, long-term anticoag per hematology    Chronic diastolic heart failure (CMS-HCC)    Human immunodeficiency virus (HIV) disease    (CMS-HCC)    Depression    Type 2 diabetes mellitus without complication, with long-term current use of insulin  (CMS-HCC)    Obesity (BMI 30.0-34.9)    Diarrhea of presumed infectious origin    Diarrhea    Active Problems    Chronic diarrhea  S/p colonoscopy 1/16 with a few polyps, not resected due to anticoagulation.  Biopsies obtained to rule out microscopic colitis. TSH WNL.  Low concern for infectious etiology given negative stool studies and CD4 > 400.   - GI consulted, appreciate recs  - Start loperamide  PRN  - Follow up fecal elastase  - Chart stools accurately     AKI on CKD I Metabolic Acidosis (improving)  Baseline Cr 1.3-1.4. Cr elevated on admission. UA with muddy brown casts. Suspect pre-renal iso GI lossess vs possible ATN etiology. Reassuringly, Cr continues to improve.   - Sodium bicarbonate  650 mg TID  - Strict I/O  - Daily RFP    GP Bacteremia  Blood cultures positive for now speciated staph hominis, suspect likely contamination. Will monitor off antibiotics.   - Discontinue vancomycin     - 1 out of 2 blood cultures + staph hominis  - Repeat 1/16 blood cultures    Atopic Dermatitis   Worsening eruption in past week. Very itchy. Not consistent with home medications although reports they help. On Dupixent , triamcinolone  cream, hydrocortisone  cream, and cetirizine .   - Continue home Kenalog  and cetirizine    - Consider dermatology consult as appears pretty uncontrolled   - WOCN consulted, appreciate recs    Chronic Problems     T2DM   Last A1c 6.1 06/2024.  On Jardiance  but not consistent with medication.   - Follow-up repeat A1C  - Consider SSI  - Glucose checks ACHS      Elevated gamma gap   Noted patient's total protein elevated to 10.5 with albumin 2.5, elevated gamma gap. Likely secondary to CKD. Could consider SPEP/UPEP but no other symptoms such as hypercalcemia or back pain.     HIV   Follows with [facility]. Most recent CD4 count mildly reduced to 484 12/25. HIV detectable 39 copies. Consistent with injectable ART with cabenuva  q8 weeks; last 12/4.  Clarified with ID he should be off Biktarvy .     Hx VTE    - Continue Apixaban  5 mg BID     Depression - Anxiety   - Continue home Sertraline      Hx seizures   - Continue home Keppra  1500 mg BID   - Continue home Clobazam  5 mg BID     The patient's presentation is complicated by the following clinically significant conditions requiring additional evaluation and treatment: - Disorders of electrolytes, volume status, and acid/base status: - Acidosis requiring further investigation, treatment, or monitoring  - Chronic kidney disease POA requiring further investigation, treatment, or monitoring       GLIM  Moderate Malnutrition (01/08/25 1532)Involuntary Weight Loss: >5-10% in 6 months  Reduced Food Intake: Any chronic gastrointestinal condition that adversely impacts food assimilation or absorption     Daily Checklist:  Diet: Regular Diet  DVT PPx: Patient Already on Full Anticoagulation with Eliquis   Electrolytes: Replete Potassium to >/= 3.6 and Magnesium  to >/= 1.8  Code Status: Full Code  Dispo: Home    Team Contact Information:   Primary Team: Nephrology ([facility])  Primary Resident: Raynell Bussing, MD  Resident's Pager: [phone] (Nephrology Senior Resident)    Interval History:   No overnight events.  Patient is scheduled for colonoscopy this morning.    All other systems were reviewed and are negative except as noted in the HPI    Objective:   Temp:  [36.5 ??C (97.7 ??F)-37.2 ??C (99 ??F)] 36.5 ??C (97.7 ??F)  Pulse:  [91-113] 109  SpO2 Pulse:  [78-102] 102  Resp:  [10-23] 20  BP: (121-165)/(72-96) 137/79  SpO2:  [95 %-100 %] 95 %    Gen: NAD, converses   HENT: atraumatic, normocephalic  Heart: RRR  Lungs: CTAB, no crackles or wheezes  Abdomen: soft, NTND  Extremities: No edema  Skin: Maculopapular rash on bilateral arms and chest with dry bandages.

## 2025-01-08 NOTE — Progress Notes (Signed)
 [facility] Consult [facility]   Progress Note         Assessment and Recommendations:   Ryan Davenport is a 64 y.o. male with a PMHx of HIV on cabenuva  (CD4 484, 11/26/24), seizures on keppra , cognitive impairment, anxiety/depression, CAD w/ prior MI s/p PCI, HFpEF, HTN, HLD, atopic dermatitis who presented to [facility] with low BP and rash of 1 wk. The patient is seen in consultation at the request of Tanda Dorn Catching, MD (Nephrology ([facility])) for chronic diarrhea.    Chronic diarrhea  S/p colonoscopy 01/08/25 with a few small polyps (not resected due to anticoagulation), no obvious other abnormality. Biopsies obtained to rule out microscopic colitis. Low concern for infectious cause of etiology at this time given negative stool studies and CD4 >400. Could be microscopic colitis still, pending path. Other thoughts would be medication related, though no obvious medication on active meds list. Bile acid malabsorption is also possible etiology, could consider bile acid sequestrant in future if negative path and loperamide  not fully improving symptoms (with consideration of interaction with ARV).  - OK for loperamide  PRN  - await pathology  - OK for diet as tolerated  - please obtain fecal elastase and TSH  - chart stools accurately please    Issues Impacting Complexity of Management:  -None    Recommendations discussed with the patient's primary team. We will continue to follow along with you.    Subjective:     Awake, ready for procedure today.     Wt Readings from Last 12 Encounters:   01/08/25 (!) 103 kg (227 lb)   01/05/25 (!) 104.2 kg (229 lb 12.8 oz)   11/26/24 (!) 110.2 kg (243 lb)   10/28/24 (!) 113 kg (249 lb 3.2 oz)   10/14/24 (!) 110 kg (242 lb 9.6 oz)   09/15/24 (!) 113.4 kg (250 lb)   08/26/24 (!) 108.9 kg (240 lb)   08/11/24 (!) 109.4 kg (241 lb 4 oz)   07/22/24 (!) 104.5 kg (230 lb 6.4 oz)   07/14/24 (!) 103 kg (227 lb)   06/12/24 (!) 103 kg (227 lb)   06/12/24 (!) 102.3 kg (225 lb 9.6 oz)     Objective:   Temp:  [36.5 ??C (97.7 ??F)-37.2 ??C (99 ??F)] 36.5 ??C (97.7 ??F)  Pulse:  [91-113] 109  SpO2 Pulse:  [78-102] 102  Resp:  [10-23] 20  BP: (121-165)/(72-96) 137/79  SpO2:  [95 %-100 %] 95 %    Gen: male in NAD, answers questions appropriately  Abdomen: Soft, NTND  Extremities: No edema in the BLEs    Pertinent Labs & Studies:  -I have reviewed the patient's labs from 01/08/2025 which show stable Hgb and improving renal function (SCr)    Labs and Imaging:__________________  __________________________________    Janna 01/06/25 - negative  GIPP 01/06/25 - negative    Endoscopies:______________________  __________________________________    Colonoscopy 11/11/2013  Impression:        - The entire examined colon is normal on direct and retroflexion views.  Recommendation:    - Repeat colonoscopy in 10 years for screening purposes.

## 2025-01-08 NOTE — Consults (Signed)
 Adult Nutrition Assessment Note    Visit Type: RN Consult  Reason for Visit: Per Admission Nutrition Screen (Adult)    NUTRITION INTERVENTIONS and RECOMMENDATION     Regular diet as tolerated  Consider soft, bland foods for possible better tolerance  Recommend trial of oral nutrition supplements if patient amenable: Ensure Clear 1x daily, Ensure Plus High Protein 1x daily  Monitor/document bowel movements  Monitor electrolytes, repletion aggressively as indicated to replace GI losses  Addition of anti-diarrheals per GI  Recommend daily multivitamin  Weekly weights    NUTRITION ASSESSMENT     Current  nutrition therapy is appropriate although not meeting nutritional  needs at this time due to chronic diarrhea.  Patient would benefit from start of oral supplement to better meet nutritional needs.  GI following for assessment of diarrhea etiology.   Micronutrients as above iso poor PO intake and malnutrition.     NUTRITIONALLY RELEVANT DATA     HPI & PMH:   64 y.o. male who is presenting to [facility] with AKI (acute kidney injury)    Nutrition History:   Patient unavailable during attempted consults to bedside, colonoscopy today. Per chart, patient with diarrhea >10x per day x2-3 months pta with decreased PO intake during this time. Experiencing abdominal discomfort pta. No documentation of n/v. GI colonoscopy today indicating okay for regular diet, ongoing work up for cause of diarrhea.     Medications:  Nutritionally pertinent medications reviewed and evaluated for potential food and/or medication interactions and include vancomycin , sodium bicarb    Labs:   Nutritionally pertinent labs reviewed.   Gluc WNL  K, mag, phos WNL    Nutritional Needs:   Healthy balance of carbohydrate, protein, and fat.     Anthropometric Data:  Height: 175.3 cm (5' 9) 175.3 cm  Admission weight: (!) 103 kg (227 lb)  Last recorded weight: (!) 103 kg (227 lb) (01/08/25)  IBW: 72.64 kg 70.7 kg  BMI: Body mass index is 33.52 kg/m??.   Usual Body Weight: Unable to obtain at this time   Weight Assessment: 8.9% loss from 10/28/24 to 01/06/25 (<3 months, significant)    Malnutrition Assessment:  Malnutrition Assessment using AND/ASPEN or GLIM Clinical Characteristics:            GLIM Moderate Malnutrition (01/08/25 1532)  Involuntary Weight Loss: >5-10% in 6 months  Reduced Food Intake: Any chronic gastrointestinal condition that adversely impacts food assimilation or absorption    Nutrition Focused Physical Exam:  Unable to complete at this time due to patient availability     Care plan:  Completed    Current Nutrition:  NPO for test/procedure  Nutrition Orders            Nutrition Therapy Regular/House starting at 01/16 1231          Nutritionally Pertinent Allergies, Intolerances, Sensitivities, and/or Cultural/Religious Restrictions:  include shellfish containing products    GOALS and EVALUATION     Patient to meet 75% or greater of nutritional needs via combination of meals, snacks, and/or oral supplements within admit.  - New    Motivation, Barriers, and Compliance:  Evaluation of motivation, barriers, and compliance pending at this time due to pt availability    Discharge Planning:   Monitor for potential discharge needs with multi-disciplinary team.          Follow-Up Parameters:   1-2 times per week (and more frequent as indicated)    Clotilda CHRISTELLA Carder, RD/LDN

## 2025-01-08 NOTE — Plan of Care (Signed)
 Shift Summary  Blood culture identified Staphylococcus hominis and procalcitonin was elevated.   Pain in the right arm was managed with fentanyl , heat, and repositioning, with the patient later resting comfortably.   Renal function remained impaired with abnormal labs and repeated lactated Ringers  administration.   Fall prevention strategies were maintained and no falls occurred.   Overall, the patient was awake, oriented, and able to move two extremities voluntarily at discharge.     Optimal Comfort and Wellbeing: Right arm pain increased mid-shift, described as throbbing and constant, but interventions including heat and repositioning were provided; pain score remained at 5 until the patient was later noted sleeping comfortably. Fentanyl  was administered intra-procedure for pain control.     Absence of Infection Signs and Symptoms: Procalcitonin was elevated and blood culture grew Staphylococcus hominis, a coagulase-negative Staphylococcus species. Temperature remained stable throughout the shift.     Absence of Fall and Fall-Related Injury: Fall risk interventions such as hourly visual checks, side rails, and scheduled toileting were consistently implemented, and no falls or injuries occurred.     Effective Renal Function: Renal function panel showed elevated creatinine and BUN, with low eGFR and albumin, consistent with ongoing renal impairment; lactated Ringers  was administered multiple times.     Effective Diarrhea Management: Go lytely was given early in the shift, and oral intake was monitored; no documentation of diarrhea episodes or related complications was noted.

## 2025-01-08 NOTE — Consults (Addendum)
 Vancomycin  Therapeutic Monitoring Pharmacy Note    Ryan Davenport is a 63 y.o. male starting vancomycin . Date of therapy initiation: 01/07/25    Indication: Bloodstream infection    Prior Dosing Information: Vancomycin  2000 mg x 1 (1/14 @ 2351)     Goals:  Therapeutic Drug Levels  Vancomycin  trough goal: 10-15 mg/L    Results: Vancomycin  level 13.9 mg/L, drawn appropriately as random level    Wt Readings from Last 1 Encounters:   01/06/25 (!) 103 kg (227 lb)     Creatinine   Date Value Ref Range Status   01/08/2025 2.68 (H) 0.73 - 1.18 mg/dL Final   98/84/7973 6.05 (H) 0.73 - 1.18 mg/dL Final   98/85/7973 3.50 (H) 0.73 - 1.18 mg/dL Final      Pharmacokinetic Considerations: Adult (estimated initial): Vd = 73.13 L, ke = 0.0144 hr-1    Assessment/Plan:  Recommendation(s)  Give vancomycin  1500 mg x1 today   Estimated trough on recommended regimen: Not applicable - dosing by level    Follow-up  Level due: tomorrow with AM labs  A pharmacist will continue to monitor and order levels as appropriate    Please contact service pharmacist with questions/clarifications.    Corean FORBES Call, PharmD

## 2025-01-09 LAB — CBC
HEMATOCRIT: 31.8 % — ABNORMAL LOW (ref 39.0–48.0)
HEMOGLOBIN: 10.5 g/dL — ABNORMAL LOW (ref 12.9–16.5)
MEAN CORPUSCULAR HEMOGLOBIN CONC: 33.2 g/dL (ref 32.0–36.0)
MEAN CORPUSCULAR HEMOGLOBIN: 28.2 pg (ref 25.9–32.4)
MEAN CORPUSCULAR VOLUME: 85.2 fL (ref 77.6–95.7)
MEAN PLATELET VOLUME: 7.5 fL (ref 6.8–10.7)
PLATELET COUNT: 351 10*9/L (ref 150–450)
RED BLOOD CELL COUNT: 3.73 10*12/L — ABNORMAL LOW (ref 4.26–5.60)
RED CELL DISTRIBUTION WIDTH: 15.9 % — ABNORMAL HIGH (ref 12.2–15.2)
WBC ADJUSTED: 6.3 10*9/L (ref 3.6–11.2)

## 2025-01-09 LAB — COMPREHENSIVE METABOLIC PANEL
ALBUMIN: 2.3 g/dL — ABNORMAL LOW (ref 3.4–5.0)
ALKALINE PHOSPHATASE: 60 U/L (ref 46–116)
ALT (SGPT): 20 U/L (ref 10–49)
ANION GAP: 9 mmol/L (ref 5–14)
AST (SGOT): 32 U/L (ref <=34)
BILIRUBIN TOTAL: 0.3 mg/dL (ref 0.3–1.2)
BLOOD UREA NITROGEN: 20 mg/dL (ref 9–23)
BUN / CREAT RATIO: 9
CALCIUM: 8.4 mg/dL — ABNORMAL LOW (ref 8.7–10.4)
CHLORIDE: 112 mmol/L — ABNORMAL HIGH (ref 98–107)
CO2: 18 mmol/L — ABNORMAL LOW (ref 20.0–31.0)
CREATININE: 2.29 mg/dL — ABNORMAL HIGH (ref 0.73–1.18)
EGFR CKD-EPI (2021) MALE: 31 mL/min/1.73m2 — ABNORMAL LOW (ref >=60)
GLUCOSE RANDOM: 83 mg/dL (ref 70–179)
POTASSIUM: 3.8 mmol/L (ref 3.4–4.8)
PROTEIN TOTAL: 9.9 g/dL — ABNORMAL HIGH (ref 5.7–8.2)
SODIUM: 139 mmol/L (ref 135–145)

## 2025-01-09 LAB — HEMOGLOBIN A1C
ESTIMATED AVERAGE GLUCOSE: 123 mg/dL
HEMOGLOBIN A1C: 5.9 % — ABNORMAL HIGH (ref 4.8–5.6)

## 2025-01-09 LAB — MAGNESIUM: MAGNESIUM: 1.5 mg/dL — ABNORMAL LOW (ref 1.6–2.6)

## 2025-01-09 MED ORDER — GABAPENTIN 100 MG CAPSULE
ORAL_CAPSULE | Freq: Every day | ORAL | 0 refills | 30.00000 days | Status: CN
Start: 2025-01-09 — End: 2025-02-08

## 2025-01-09 MED ORDER — TRIAMCINOLONE ACETONIDE 0.1 % TOPICAL OINTMENT
Freq: Two times a day (BID) | TOPICAL | 5 refills | 0.00000 days | Status: CP
Start: 2025-01-09 — End: 2026-01-09

## 2025-01-09 MED ORDER — SERTRALINE 50 MG TABLET
ORAL_TABLET | Freq: Every day | ORAL | 0 refills | 30.00000 days | Status: CN
Start: 2025-01-09 — End: 2025-02-08

## 2025-01-09 MED ORDER — LOPERAMIDE 2 MG CAPSULE
ORAL_CAPSULE | Freq: Four times a day (QID) | ORAL | 0 refills | 8.00000 days | Status: CP | PRN
Start: 2025-01-09 — End: 2025-02-08

## 2025-01-09 MED ORDER — SODIUM BICARBONATE 650 MG TABLET
ORAL_TABLET | Freq: Three times a day (TID) | ORAL | 2 refills | 30.00000 days | Status: CP
Start: 2025-01-09 — End: 2025-04-09

## 2025-01-09 MED ORDER — LISINOPRIL 10 MG TABLET
ORAL_TABLET | Freq: Every day | ORAL | 0 refills | 30.00000 days | Status: CN
Start: 2025-01-09 — End: 2025-02-08

## 2025-01-09 MED ADMIN — sodium bicarbonate tablet 650 mg: 650 mg | ORAL | @ 18:00:00 | Stop: 2025-01-09

## 2025-01-09 MED ADMIN — sodium bicarbonate tablet 650 mg: 650 mg | ORAL | @ 13:00:00 | Stop: 2025-01-09

## 2025-01-09 MED ADMIN — sodium bicarbonate tablet 650 mg: 650 mg | ORAL | @ 03:00:00 | Stop: 2025-01-10

## 2025-01-09 MED ADMIN — levETIRAcetam (KEPPRA) tablet 1,500 mg: 1500 mg | ORAL | @ 13:00:00 | Stop: 2025-01-09

## 2025-01-09 MED ADMIN — levETIRAcetam (KEPPRA) tablet 1,500 mg: 1500 mg | ORAL | @ 03:00:00

## 2025-01-09 MED ADMIN — atorvastatin (LIPITOR) tablet 40 mg: 40 mg | ORAL | @ 13:00:00 | Stop: 2025-01-09

## 2025-01-09 MED ADMIN — cloBAZam (ONFI) split tablet 5 mg: 5 mg | ORAL | @ 13:00:00 | Stop: 2025-01-09

## 2025-01-09 MED ADMIN — cloBAZam (ONFI) split tablet 5 mg: 5 mg | ORAL | @ 03:00:00

## 2025-01-09 MED ADMIN — triamcinolone (KENALOG) 0.1 % ointment: TOPICAL | @ 03:00:00

## 2025-01-09 MED ADMIN — triamcinolone (KENALOG) 0.1 % ointment: TOPICAL | @ 14:00:00 | Stop: 2025-01-09

## 2025-01-09 MED ADMIN — apixaban (ELIQUIS) tablet 5 mg: 5 mg | ORAL | @ 14:00:00 | Stop: 2025-01-09

## 2025-01-09 MED ADMIN — apixaban (ELIQUIS) tablet 5 mg: 5 mg | ORAL | @ 03:00:00

## 2025-01-09 MED ADMIN — multivitamins, therapeutic with minerals tablet 1 tablet: 1 | ORAL | @ 13:00:00 | Stop: 2025-01-09

## 2025-01-09 MED ADMIN — hydrocortisone 2.5 % ointment 1 Application: 1 | TOPICAL | @ 03:00:00

## 2025-01-09 MED ADMIN — hydrocortisone 2.5 % ointment 1 Application: 1 | TOPICAL | @ 14:00:00 | Stop: 2025-01-09

## 2025-01-09 MED ADMIN — sertraline (ZOLOFT) tablet 50 mg: 50 mg | ORAL | @ 14:00:00 | Stop: 2025-01-09

## 2025-01-09 MED ADMIN — magnesium sulfate 2gm/50mL IVPB: 2 g | INTRAVENOUS | @ 13:00:00 | Stop: 2025-01-09

## 2025-01-09 NOTE — Discharge Summary (Signed)
 Physician Discharge Summary [facility]  3 [facility]  [address]  [place] [address]  Dept: [phone]  Loc: [phone]     Identifying Information:   Ryan Davenport  11-May-1961  [id number]    Primary Care Physician: Ryan Nelwyn Sago, PA     Code Status: Full Code    Admit Date: 01/05/2025    Discharge Date: 01/09/2025     Discharge To: Home    Discharge Service: [facility] - Nephrology Floor Team ([facility] [place])     Discharge Attending Physician: Ryan Von Kell, MD    Discharge Diagnoses:   Principal Problem:    AKI (acute kidney injury) (POA: Yes)  Active Problems:    Pulmonary embolism 1/12, long-term anticoag per hematology (POA: Yes)    Chronic diastolic heart failure (CMS-HCC) (POA: Yes)    Human immunodeficiency virus (HIV) disease    (CMS-HCC) (POA: Yes)    Essential hypertension (POA: Yes)    Atopic dermatitis (POA: Yes)    Depression (POA: Yes)    Type 2 diabetes mellitus without complication, with long-term current use of insulin  (CMS-HCC) (POA: Not Applicable)    Obesity (BMI 30.0-34.9) (POA: Yes)    Hyperlipidemia (POA: Yes)    Diarrhea (POA: Yes)    Malnutrition (HHS-HCC) (POA: Yes)  Resolved Problems:    * No resolved hospital problems. [facility] Site Course:   Outpatient Provider Follow Up Issues:   - Follow up on high uric acid and elevated gamma gap   - Follow up on blood cultures, fecal elastase, and surgical pathology   - Repeat labs next week at PCP: CBC, BMP  - Discuss with PCP when to restart lisinopril  and hydrochlorothiazide     Brief Hospital Course:   Ryan Davenport is a 64 year old male with history of CHF, HIV, T2DM, obesity, and CKD who presents for AKI in the setting of worsening diarrhea.     Detailed Hospital Course By Problem:     Oliguric AKI on CKD Stage 3 - Acute Diarrhea - Metabolic Acidosis - ATN  Diarrhea for the past 2-3 months worsened for the past two weeks. Hypotensive on arrival (70s systolic). Baseline creatinine was 1 until November 2025 then 1.3-1.4. Cr of 7.09 on arrival. No urine in first 24 hours. Hx of notable proteinuria and trace blood on previous UA. Muddy brown casts seen. Suspected CKD secondary to DM, HTN, and possibly HIV. Prenal/ATN this admission. Replaced fluids and urination improved. For diarrhea considering infectious causes vs malabsorption. GI pathogen panel and c.diff negative. 1 out of 2 blood culture positive for staph hominis; thought to be a contaminant. Given one time dose of vancomycin . Repeat blood cultures pending on discharge. Follow up results with PCP. Diagnostic colonoscopy 1/16 with a few small polyps (not resected due to anticoagulation) no obvious other abnormality. Surgical path pending on discharge to follow up with GI outpatient on that and his fecal elastase value. Overall, diarrhea has resolved and patient is feeling better. He is appropriate for outpatient follow up. Creatinine on discharge much improved at 2.29. He continues on oral sodium bicarb for acidosis. He will have repeat labs next week with PCP and outpatient GI follow up.     GLIM Moderate Malnutrition   Involuntary weight loss >5-10% in 6 months due to reduced food intake from chronic GI condition. Appetite improved inpatient.     Chronic problems     HIV   Follows with [place] ID. Most  recent CD4 count mildly reduced to 484 12/25. HIV detectable 39 copies. Consistent with injectable ART with cabenuva  q8 weeks; last 12/4. However still has Biktarvy  on med list and patient is poor historian. Clarified with ID he should be off Biktarvy .      Atopic Dermatitis   Worsening eruption in past week. Very itchy. Not consistent with home medications although reports they help. Continued home Dupixent , triamcinolone  cream, hydrocortisone  cream, and cetirizine . WOCN teaching for hand wound maintenance provided prior to discharge. Anticipate following up with PCP and discuss dermatology referral at this time.     Hx VTE on Eliquis    Several VTE on Eliquis  previously. Continued home Apixaban  5 mg BID.      Depression - Anxiety   Continued home sertraline .      Hx seizures   Continued home Keppra  1500 mg BID and Clobazam  5 mg BID.      Elevated gamma gap   Noted patient's total protein elevated to 10.5 with albumin 2.5, elevated gamma gap. Likely secondary to CKD. Could consider SPEP/UPEP but no other symptoms such as hypercalcemia or back pain. He will follow up with PCP.      T2DM   On Jardiance  but not consistent with medication. BG well controlled during hospitalization. A1c 5.9% on 1/16.     Hypertension   On arrival was significantly hypotensive. Held lisinopril  10 mg and hydrochlorothiazide 12.5 mg. He will follow up with outpatient provider to discuss when to restart these medications.     CHF   On metoprolol  12.5 mg outpatient. Held on admission given hypotension. He will restart at discharge.     Hyperlipidemia   He continued home atorvastatin  40 mg.   The patient's hospital stay has been complicated by the following clinically significant conditions requiring additional evaluation and treatment or having a significant effect of this patient's care: - Malnutrition POA requiring further investigation, treatment, or monitoring  - Chronic kidney disease POA requiring further investigation, treatment, or monitoring     Nutrition Assessment:           Outpatient Provider Follow Up Issues:   - Follow up on high uric acid and elevated gamma gap   - Follow up on blood cultures, fecal elastase, and surgical pathology   - Repeat labs next week at PCP   - Discuss with PCP when to restart lisinopril  and hydrochlorothiazide     Touchbase with Outpatient Provider:  Warm Handoff: Completed on 01/09/2025 by Ryan SHAUNNA Dare, MD  (Intern) via Face to Face    Procedures:  colonoscopy  ______________________________________________________________________  Discharge Medications:      Your Medication List        PAUSE taking these medications      hydroCHLOROthiazide 12.5 MG tablet  Wait to take this until your doctor or other care provider tells you to start again.  Take 1 tablet (12.5 mg total) by mouth daily.     lisinopril  10 MG tablet  Wait to take this until your doctor or other care provider tells you to start again.  Commonly known as: PRINIVIL ,ZESTRIL   Take 1 tablet (10 mg total) by mouth daily.            START taking these medications      loperamide  2 mg capsule  Commonly known as: IMODIUM   Take 1 capsule (2 mg total) by mouth four (4) times a day as needed.     sodium bicarbonate  650 mg tablet  Take 1 tablet (650 mg  total) by mouth Three (3) times a day.            CHANGE how you take these medications      omeprazole  40 MG capsule  Commonly known as: PriLOSEC  40mg  BID  What changed:   how much to take  how to take this  when to take this            CONTINUE taking these medications      atorvastatin  40 MG tablet  Commonly known as: LIPITOR   Take 1 tablet (40 mg total) by mouth daily.     betamethasone  dipropionate 0.05 % cream  Apply 1 Application topically two (2) times a day.     blood-glucose meter kit  Use as instructed     cabotegravir -rilpivirine  600 mg-900 mg/3 mL extended-release injection  Commonly known as: CABENUVA   Inject 6 mL into the muscle every 8 weeks.     ciclopirox  8 % solution  Commonly known as: PENLAC   Apply topically nightly. Apply over nail and surrounding skin. Apply daily over previous coat. After seven (7) days, may remove with alcohol  and continue cycle.     cloBAZam  10 mg Tab  Commonly known as: ONFI   Take 0.5 (one-half) tablet (5 mg total) by mouth two (2) times a day.     colchicine  0.6 mg tablet  Commonly known as: COLCRYS   TAKE 1 TABLET BY MOUTH DAILY AS NEEDED FOR GOUT     cyclobenzaprine  5 MG tablet  Commonly known as: FLEXERIL   Take 1 tablet (5 mg total) by mouth.     ELIQUIS  5 mg Tab  Generic drug: apixaban   Take 1 tablet (5 mg total) by mouth two (2) times a day.     empagliflozin  25 mg tablet  Commonly known as: JARDIANCE   Take 1 tablet (25 mg total) by mouth daily.     empty container Misc  Use as directed to dispose for Dupixent  pens.     furosemide  20 MG tablet  Commonly known as: LASIX   Take 1 tablet (20 mg total) by mouth daily as needed.     gabapentin  100 MG capsule  Commonly known as: NEURONTIN   Take 1 capsule (100 mg total) by mouth in the morning.     glucose blood test strip  Generic drug: blood sugar diagnostic  Use to check blood sugar as directed with insulin  3 times a day & for symptoms of high or low blood sugar.     hydrocortisone  2.5 % ointment  Apply 1 Application topically two (2) times a day. To active areas of the face until smooth     hydrOXYzine  25 MG tablet  Commonly known as: ATARAX   Take 1 tablet (25 mg total) by mouth every six (6) hours as needed.     lancets Misc  Use to check blood sugar as directed with insulin  3 times a day & for symptoms of high or low blood sugar.     levETIRAcetam  750 MG tablet  Commonly known as: KEPPRA   Take 2 tablets (1,500 mg total) by mouth two (2) times a day.     methocarbamol  750 MG tablet  Commonly known as: ROBAXIN   750mg  4x daily prn muscle spasms     metoPROLOL  succinate 25 MG 24 hr tablet  Commonly known as: Toprol -XL  Take 0.5 tablets (12.5 mg total) by mouth daily.     pen needle, diabetic 32 gauge x 5/32 (4 mm) Ndle  Use with insulin  up to 4 times/day as  needed.     sertraline  50 MG tablet  Commonly known as: ZOLOFT   Take 1 tablet (50 mg total) by mouth daily.     triamcinolone  0.1 % ointment  Commonly known as: KENALOG   Apply the medication twice daily to affected areas of the skin until smooth. Then stop and re-start as soon as the skin changes come back.     triamcinolone  0.1 % ointment  Commonly known as: KENALOG   Apply topically two (2) times a day. To active areas of trunk and extremities until smooth              Allergies:  Latex, Shellfish containing products, Metformin , and Penicillins  ______________________________________________________________________  Pending Test Results:  Pending Labs       Order Current Status    Blood Culture In process    Blood Culture In process    Pancreatic Elastase, Fecal In process    Surgical pathology exam In process    Blood Culture #1 Preliminary result            Most Recent Labs:  All lab results last 24 hours -   Recent Results (from the past 24 hours)   POCT Glucose    Collection Time: 01/08/25  5:00 PM   Result Value Ref Range    Glucose, POC 109 70 - 179 mg/dL   POCT Glucose    Collection Time: 01/08/25  9:36 PM   Result Value Ref Range    Glucose, POC 102 70 - 179 mg/dL   CBC    Collection Time: 01/09/25  3:17 AM   Result Value Ref Range    WBC 6.3 3.6 - 11.2 10*9/L    RBC 3.73 (L) 4.26 - 5.60 10*12/L    HGB 10.5 (L) 12.9 - 16.5 g/dL    HCT 68.1 (L) 60.9 - 48.0 %    MCV 85.2 77.6 - 95.7 fL    MCH 28.2 25.9 - 32.4 pg    MCHC 33.2 32.0 - 36.0 g/dL    RDW 84.0 (H) 87.7 - 15.2 %    MPV 7.5 6.8 - 10.7 fL    Platelet 351 150 - 450 10*9/L   Comprehensive Metabolic Panel    Collection Time: 01/09/25  3:17 AM   Result Value Ref Range    Sodium 139 135 - 145 mmol/L    Potassium 3.8 3.4 - 4.8 mmol/L    Chloride 112 (H) 98 - 107 mmol/L    CO2 18.0 (L) 20.0 - 31.0 mmol/L    Anion Gap 9 5 - 14 mmol/L    BUN 20 9 - 23 mg/dL    Creatinine 7.70 (H) 0.73 - 1.18 mg/dL    BUN/Creatinine Ratio 9     eGFR CKD-EPI (2021) Male 31 (L) >=60 mL/min/1.7m2    Glucose 83 70 - 179 mg/dL    Calcium 8.4 (L) 8.7 - 10.4 mg/dL    Albumin 2.3 (L) 3.4 - 5.0 g/dL    Total Protein 9.9 (H) 5.7 - 8.2 g/dL    Total Bilirubin 0.3 0.3 - 1.2 mg/dL    AST 32 <=65 U/L    ALT 20 10 - 49 U/L    Alkaline Phosphatase 60 46 - 116 U/L   Magnesium  Level    Collection Time: 01/09/25  3:17 AM   Result Value Ref Range    Magnesium  1.5 (L) 1.6 - 2.6 mg/dL       Relevant Studies/Radiology:  Colonoscopy  Result Date: 01/08/2025  _______________________________________________________________________________ Patient Name:  Ryan Davenport           Procedure Date: 01/08/2025 9:54 AM MRN: [id number] Date of Birth: 03-11-1961 Admit Type: Inpatient                 Age: 64 Room: [facility] OR 04 [facility]          Gender: Male Note Status: Finalized                Instrument Name: [phone] _______________________________________________________________________________  Procedure:             Colonoscopy Indications:           Clinically significant diarrhea of unexplained origin Providers:             CRAIG GEORGI RASE, MD, SYDNEY M. POWER (Fellow),                        LAURITA RAW, [facility] torres Referring MD:          Medicines:             Propofol  per Anesthesia Complications:         No immediate complications. _______________________________________________________________________________ Procedure:             Pre-Anesthesia Assessment:                        - Prior to the procedure, a History and Physical was                        performed, and patient medications and allergies were                        reviewed. The patient's tolerance of previous                        anesthesia was also reviewed. The risks and benefits                        of the procedure and the sedation options and risks                        were discussed with the patient. All questions were                        answered, and informed consent was obtained. Prior                        Anticoagulants: The patient has taken Eliquis                         (apixaban ), last dose was day of procedure. ASA Grade                        Assessment: III - A patient with severe systemic                        disease. After reviewing the risks and benefits, the                        patient was deemed in satisfactory condition to  undergo the procedure.                        After obtaining informed consent, the scope was passed                        under direct vision. Throughout the procedure, the                        patient's blood pressure, pulse, and oxygen saturations were monitored continuously. The                        Colonoscope was introduced through the anus and                        advanced to the the cecum, identified by appendiceal                        orifice and ileocecal valve. The terminal ileum,                        ileocecal valve, appendiceal orifice, and rectum were                        photographed. The quality of the bowel preparation was                        excellent. The terminal ileum, ileocecal valve,                        appendiceal orifice, and rectum were photographed.                                                                                Findings:      The perianal and digital rectal examinations were normal.      The colon (entire examined portion) appeared normal. Biopsies for      histology were taken with a cold forceps from the right colon and left      colon for evaluation of microscopic colitis. Estimated blood loss was      minimal.      One semi-sessile polyp was found in the ascending colon, and two      semi-sessile polyps were found in the transverse colon. The polyps were      small (4-6 mm) in size. Polypectomy was not attempted due to therapeutic      anticoagulation with Eliquis . Estimated blood loss: none.      The terminal ileum appeared normal.  Impression:            - The entire examined colon is normal. Biopsied.                        - Three small (4-6 mm) polyps in the transverse colon                        and in the ascending colon. Resection not attempted.                        - The examined portion of the ileum was normal. Recommendation:        - Further recommendations can be made by the GI                        consult team. Please contact them if necessary.                        - Return patient to hospital ward for ongoing care.                        - Resume previous diet today. - Continue present medications.                        - Await pathology results.                        - Repeat colonoscopy as an outpatient when Eliquis  can                        be held for endoluminal removal of polyp.                                                                                Procedure Code(s):     --- Professional ---                        [phone], Colonoscopy, flexible; with biopsy, single or                        multiple CPT copyright 2023 American Medical Association. All rights reserved. The codes documented in this report are preliminary and upon coder review may be revised to meet current compliance requirements. Attending Participation:      I was present and participated during the entire procedure, including      non-key portions.                                                                                Electronically Signed By Lorrene Rase, MD ______________________ LORRENE GEORGI RASE,  MD 01/08/2025 10:55:40 AM The attending physician was present throughout the entire procedure including the insertion, viewing, and removal of the endoscope. This procedure note has been electronically signed by: LORRENE RASE , MD ________________ LACINDA HERO POWER, Number of Addenda: 0 Note Initiated On: 01/08/2025 9:54 AM    ECG 12 Lead  Result Date: 01/07/2025  SINUS TACHYCARDIA MINIMAL VOLTAGE CRITERIA FOR LVH, MAY BE NORMAL VARIANT ( Cornell product ) BORDERLINE ECG WHEN COMPARED WITH ECG OF 30-Mar-2024 00:53, PREMATURE ATRIAL BEATS ARE NO LONGER PRESENT Confirmed by Antonetta Gull ([id number]) on 01/07/2025 3:03:22 PM    ECG 12 Lead  Result Date: 01/07/2025  NORMAL SINUS RHYTHM INCOMPLETE RIGHT BUNDLE BRANCH BLOCK BORDERLINE ECG WHEN COMPARED WITH ECG OF 05-Jan-2025 19:35, NO SIGNIFICANT CHANGE WAS FOUND Confirmed by Antonetta Gull ([id number]) on 01/07/2025 2:18:52 PM    ECG 12 Lead  Result Date: 01/07/2025  NORMAL SINUS RHYTHM INCOMPLETE RIGHT BUNDLE BRANCH BLOCK BORDERLINE ECG WHEN COMPARED WITH ECG OF 05-Jan-2025 17:26, INCOMPLETE RIGHT BUNDLE BRANCH BLOCK IS NOW PRESENT Confirmed by Antonetta Gull ([id number]) on 01/07/2025 1:48:25 PM    US  Renal Complete  Result Date: 01/05/2025  EXAM: US  RENAL COMPLETE ACCESSION: [id number] UN REPORT DATE: 01/05/2025 8:38 PM CLINICAL INDICATION: 64 years old with New renal failure  COMPARISON: CT abdomen pelvis 07/28/2019, renal ultrasound 01/19/2011 TECHNIQUE: Static and cine images of the kidneys and bladder were performed. FINDINGS: KIDNEYS: Normal size and echogenicity. No solid renal masses, calculi, or hydronephrosis. There are multiple cysts within the left kidney with the largest measuring approximately 8 cm..      Right kidney: 12.5 cm      Left kidney: 13.5 cm BLADDER: The bladder is underdistended limiting evaluation.      Bladder volume prevoid: 42.2 mL     Multiple left renal cysts measuring up to 8 cm. Otherwise unremarkable sonographic evaluation of the kidneys. Limited views of the bladder secondary to underdistention.     XR Chest 2 views  Result Date: 01/05/2025  EXAM: XR CHEST 2 VIEWS ACCESSION: [id number] UN REPORT DATE: 01/05/2025 8:53 PM CLINICAL INDICATION: CHEST PAIN  TECHNIQUE: PA and Lateral Chest Radiographs COMPARISON: 07/14/2024 FINDINGS: Cardiomediastinal silhouette is within upper normal limits. No acute lung consolidation. Minimal linear lung opacities consistent with atelectasis. No pneumothorax or pleural effusion. Degenerative changes. Chronic elevation of the left hemidiaphragm.     No acute lung consolidation.     ______________________________________________________________________    Appointments which have been scheduled for you      Jan 14, 2025 1:30 PM  (Arrive by 1:15 PM)  DIABETES FOLLOW-UP with Nelwyn Leveda Hamilton, PA  [facility] INTERNAL MEDICINE [place] [facility] REGION) [address]  [facility] 1 through 4  [place] [address]  [phone]        Jan 27, 2025 3:30 PM  (Arrive by 3:15 PM)  OFFICE VISIT with Fairy HERO Bibber, MD  [facility] INFECTIOUS DISEASES [place] [facility] REGION) [address]  [facility] 1 through 4  [place] [address]  [phone]        Jan 27, 2025 4:00 PM  (Arrive by 3:45 PM)  NURSE VISIT with MEDIND CABENUVA  MD NURSE  [place] INFECTIOUS DISEASES [place] [facility] REGION) [address]  [facility] 1 through 4  [place] [address]  [phone]             ______________________________________________________________________  Discharge Day Services:  BP 147/67  - Pulse 115  - Temp  37.1 ??C (98.8 ??F) (Oral)  - Resp 19  - Ht 175.3 cm (5' 9)  - Wt (!) 103 kg (227 lb)  - SpO2 98%  - BMI 33.52 kg/m??     Pt seen on the day of discharge and determined appropriate for discharge.    Condition at Discharge: stable    Length of Discharge: I spent greater than 30 mins in the discharge of this patient.    Signature(s):     Sarah Rebbeor, MS3     I attest that I have reviewed the medical student note and that the components of the history of the present illness, the physical exam, and the assessment and plan documented were performed by me or were performed in my presence by the student where I verified the documentation and performed (or re-performed) the exam and medical decision making. Jaymian Bogart SHAUNNA Dare, MD

## 2025-01-12 ENCOUNTER — Ambulatory Visit: Admitting: Podiatry

## 2025-01-12 DIAGNOSIS — Z09 Encounter for follow-up examination after completed treatment for conditions other than malignant neoplasm: Principal | ICD-10-CM

## 2025-01-14 ENCOUNTER — Encounter: Admit: 2025-01-14 | Discharge: 2025-01-15 | Payer: Medicare (Managed Care)

## 2025-01-14 ENCOUNTER — Ambulatory Visit: Admit: 2025-01-14 | Discharge: 2025-01-15 | Payer: Medicare (Managed Care)

## 2025-01-14 DIAGNOSIS — R195 Other fecal abnormalities: Principal | ICD-10-CM

## 2025-01-14 DIAGNOSIS — Z09 Encounter for follow-up examination after completed treatment for conditions other than malignant neoplasm: Principal | ICD-10-CM

## 2025-01-14 DIAGNOSIS — B20 Human immunodeficiency virus [HIV] disease: Principal | ICD-10-CM

## 2025-01-14 DIAGNOSIS — K529 Noninfective gastroenteritis and colitis, unspecified: Principal | ICD-10-CM

## 2025-01-14 LAB — CBC
HEMATOCRIT: 33.3 % — ABNORMAL LOW (ref 39.0–48.0)
HEMOGLOBIN: 10.9 g/dL — ABNORMAL LOW (ref 12.9–16.5)
MEAN CORPUSCULAR HEMOGLOBIN CONC: 32.8 g/dL (ref 32.0–36.0)
MEAN CORPUSCULAR HEMOGLOBIN: 26.6 pg (ref 25.9–32.4)
MEAN CORPUSCULAR VOLUME: 81.2 fL (ref 77.6–95.7)
MEAN PLATELET VOLUME: 7 fL (ref 6.8–10.7)
PLATELET COUNT: 430 10*9/L (ref 150–450)
RED BLOOD CELL COUNT: 4.11 10*12/L — ABNORMAL LOW (ref 4.26–5.60)
RED CELL DISTRIBUTION WIDTH: 16.1 % — ABNORMAL HIGH (ref 12.2–15.2)
WBC ADJUSTED: 6.5 10*9/L (ref 3.6–11.2)

## 2025-01-14 LAB — COMPREHENSIVE METABOLIC PANEL
ALBUMIN: 2.7 g/dL — ABNORMAL LOW (ref 3.4–5.0)
ALKALINE PHOSPHATASE: 63 U/L (ref 46–116)
ALT (SGPT): 29 U/L (ref 10–49)
ANION GAP: 9 mmol/L (ref 5–14)
AST (SGOT): 41 U/L — ABNORMAL HIGH (ref <=34)
BILIRUBIN TOTAL: 0.2 mg/dL — ABNORMAL LOW (ref 0.3–1.2)
BLOOD UREA NITROGEN: 18 mg/dL (ref 9–23)
BUN / CREAT RATIO: 10
CALCIUM: 9 mg/dL (ref 8.7–10.4)
CHLORIDE: 105 mmol/L (ref 98–107)
CO2: 22.5 mmol/L (ref 20.0–31.0)
CREATININE: 1.8 mg/dL — ABNORMAL HIGH (ref 0.73–1.18)
EGFR CKD-EPI (2021) MALE: 42 mL/min/1.73m2 — ABNORMAL LOW (ref >=60)
GLUCOSE RANDOM: 104 mg/dL (ref 70–179)
POTASSIUM: 3.9 mmol/L (ref 3.4–4.8)
PROTEIN TOTAL: 11.4 g/dL — ABNORMAL HIGH (ref 5.7–8.2)
SODIUM: 136 mmol/L (ref 135–145)

## 2025-01-14 LAB — URIC ACID: URIC ACID: 10.1 mg/dL — ABNORMAL HIGH (ref 3.7–9.2)

## 2025-01-14 MED ORDER — EMPAGLIFLOZIN 25 MG TABLET
ORAL_TABLET | Freq: Every day | ORAL | 3 refills | 90.00000 days | Status: CP
Start: 2025-01-14 — End: ?

## 2025-01-14 MED ORDER — APIXABAN 5 MG TABLET
ORAL_TABLET | Freq: Two times a day (BID) | ORAL | PRN refills | 30.00000 days | Status: CP
Start: 2025-01-14 — End: ?

## 2025-01-14 NOTE — Patient Instructions (Addendum)
 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

## 2025-01-14 NOTE — Progress Notes (Signed)
 [facility]     Reason for visit: Follow up    Questions / Concerns that need to be addressed: hospital follow up    Screening BP- 134/69    105      PTHomeBP     Diabetes:  Regularly checking blood sugars?: no  If yes, when? Complete log for past 7 days  Date Before Breakfast After Breakfast Before Lunch After Lunch Before Dinner After Dinner Before Bed                                                                                                                                     Dexcom or Libre CGM in use? If so, pull appropriate reporting through portal (Dexcom) or EPIC order [name].    HCDM reviewed and updated in Epic:    We are working to make sure all of our patients??? wishes are updated in Epic and part of that is documenting a Environmental Health Practitioner for each patient  A Health Care Decision Maker is someone you choose who can make health care decisions for you if you are not able - who would you most want to do this for you????  is already up to date.    HCDM (patient stated preference): Ryan, Davenport - Mother - [phone]    BPAs completed:      Annual Screenings:     __________________________________________________________________________________________    SCREENINGS COMPLETED IN FLOWSHEETS      AUDIT       PHQ2       PHQ9          GAD7       COPD Assessment       Falls Risk
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
## 2025-01-15 LAB — HIV RNA, QUANTITATIVE, PCR: HIV RNA QNT RSLT: NOT DETECTED

## 2025-01-18 NOTE — Progress Notes (Signed)
 [facility] Clinic Administered Medication Refill Coordination Note      NAME:Adeyemi Kaide Gage DOB: 1961/03/13      Medication: CABENUVA  600 mg/3 mL- 900 mg/3 mL extended-release injection (cabotegravir -rilpivirine )    Day Supply: 56 days      SHIPPING      Next delivery from [facility] [phone]) to [facility] for Wolm Marsa Louder is scheduled for 01/22/2025.    Clinic contact: BJ Turner    Patient's next nurse visit for administration: 01/27/2025.    We will follow up with clinic monthly for standard refill processing and delivery.      [name]  [facility] Specialty Technician

## 2025-01-20 NOTE — Progress Notes (Signed)
 Removed patient flag.      Ryan Davenport  Benefits Counselor  Time of Intervention: 5 mins

## 2025-01-21 NOTE — Progress Notes (Signed)
 Wolm Marsa Louder 's Cabenuva  shipment will be delayed as a result of no credit card on file to collect copayment.     I have reached out to the patient  at [phone] and communicated the delay. We will wait for a call back from the patient to reschedule the delivery.  We have not confirmed the new delivery date.

## 2025-01-21 NOTE — Telephone Encounter (Signed)
 Duration of Intervention: ~10-15 minutes  Type of Contact: phone  Reason for Contact: To confirm delivery status of Cabenuva  shipment for upcoming injection appointment.  Assessment: Coordination with insurance and specialty pharmacy initiated to ensure medication delivery to clinic/pharmacy in time for administration. To enable patient adherence to medication treatment plan.  Intervention: Contacted pharmacy vendor: [facility]  Pharmacy vendor confirmed that the Cabenuva  shipment is not in place -  as he needs to set up credit card for payment.     Patient will be notified once medication is received and available for administration and will review importance of keeping injection appointment within the appropriate ??7-day window.  Care Plan:  Monitor shipment status and confirm medication delivery to clinic.   Notify patient when medication has arrived.  Maintain documentation of co-pay assistance and authorization status in chart.  Continue close coordination with pharmacy and insurance as needed for future doses    Unable to reach Ryan Davenport - VM is full. Left SMS message.  Does not have an active My Chart acct.    Electronically signed by:  Clydie Bihari, RN

## 2025-01-24 DIAGNOSIS — B20 Human immunodeficiency virus [HIV] disease: Principal | ICD-10-CM

## 2025-01-27 ENCOUNTER — Encounter: Admit: 2025-01-27 | Discharge: 2025-01-28 | Payer: Medicare (Managed Care)

## 2025-01-27 ENCOUNTER — Inpatient Hospital Stay: Admit: 2025-01-27 | Discharge: 2025-01-28 | Payer: Medicare (Managed Care)

## 2025-01-27 ENCOUNTER — Ambulatory Visit: Admit: 2025-01-27 | Discharge: 2025-01-28 | Payer: Medicare (Managed Care)

## 2025-01-27 DIAGNOSIS — N179 Acute kidney failure, unspecified: Secondary | ICD-10-CM

## 2025-01-27 DIAGNOSIS — B20 Human immunodeficiency virus [HIV] disease: Principal | ICD-10-CM

## 2025-01-27 DIAGNOSIS — E1140 Type 2 diabetes mellitus with diabetic neuropathy, unspecified: Secondary | ICD-10-CM

## 2025-01-27 DIAGNOSIS — L209 Atopic dermatitis, unspecified: Principal | ICD-10-CM

## 2025-01-27 DIAGNOSIS — I82532 Chronic embolism and thrombosis of left popliteal vein: Secondary | ICD-10-CM

## 2025-01-27 DIAGNOSIS — Z0189 Encounter for other specified special examinations: Principal | ICD-10-CM

## 2025-01-27 DIAGNOSIS — Z794 Long term (current) use of insulin: Secondary | ICD-10-CM

## 2025-01-27 DIAGNOSIS — M25531 Pain in right wrist: Secondary | ICD-10-CM

## 2025-01-27 DIAGNOSIS — G40909 Epilepsy, unspecified, not intractable, without status epilepticus: Secondary | ICD-10-CM

## 2025-01-27 DIAGNOSIS — K529 Noninfective gastroenteritis and colitis, unspecified: Secondary | ICD-10-CM

## 2025-01-27 LAB — BASIC METABOLIC PANEL
ANION GAP: 9 mmol/L (ref 5–14)
BLOOD UREA NITROGEN: 15 mg/dL (ref 9–23)
BUN / CREAT RATIO: 10
CALCIUM: 9.2 mg/dL (ref 8.7–10.4)
CHLORIDE: 101 mmol/L (ref 98–107)
CO2: 27.9 mmol/L (ref 20.0–31.0)
CREATININE: 1.54 mg/dL — ABNORMAL HIGH (ref 0.73–1.18)
EGFR CKD-EPI (2021) MALE: 51 mL/min/{1.73_m2} — ABNORMAL LOW (ref >=60)
GLUCOSE RANDOM: 109 mg/dL (ref 70–179)
POTASSIUM: 4 mmol/L (ref 3.4–4.8)
SODIUM: 138 mmol/L (ref 135–145)

## 2025-01-27 MED ORDER — CLOBAZAM 10 MG TABLET
ORAL_TABLET | Freq: Two times a day (BID) | ORAL | 5 refills | 30.00000 days | Status: CP
Start: 2025-01-27 — End: ?

## 2025-01-27 MED ADMIN — cabotegravir-rilpivirine (CABENUVA) 600 mg-900 mg/3 mL extended-release injection 6 mL: 6 mL | INTRAMUSCULAR | @ 21:00:00 | Stop: 2025-01-27

## 2025-01-27 NOTE — Progress Notes (Deleted)
 Called Ryan Davenport to see if I could reach him.  Stated he did not ever call anyone. Gave him [facility] specialty phone number and asked him to give them a call.   Informed him I do not have his medication at the clinic as of yet since he did not call them.
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
## 2025-01-27 NOTE — Patient Instructions (Signed)
 Thank you for trusting [facility] with your care!           Please note that your laboratory and other results may be visible to you in real time, possibly before they reach your provider. Please allow 48 hours for clinical interpretation of these results. Importantly, even if a result is flagged as abnormal, it may not be one that impacts your health.    The ID clinic phone number is [phone] (toll free [phone]).  The ID clinic fax number is [phone].    For urgent issues on nights and weekends you may reach the ID Physician on call through the [facility] Operator at [phone].     [name], MD    [facility] at [facility]  [address]   [place], [place] [address]  Phone: [phone]   Fax: [phone]

## 2025-01-27 NOTE — Progress Notes (Signed)
 Duration of Intervention: 60 minutes   Type of Contact: in-person   Reason for Contact: Patient in clinic for scheduled Cabenuva  injection for ongoing HIV-1 maintenance therapy.   Intervention: Patient presented for routine Cabenuva  injection. Discussed any changes since the last visit:   Mood:  No changes  Sleep: No Changes   Cabenuva  administered per protocol:   Medication: Cabotegravir  and Rilpivirine  Bi-Monthly   Needle size: 22G 2  Patient tolerated the injection well   Observed post-injection; no immediate adverse effects noted   Care Plan:   Continue Cabenuva  per established dosing interval   Next injection scheduled for: 04/01   Patient will not meet with the provider at the next visit   Monitor for side effects and assess mood/sleep changes at follow-ups   Reinforce treatment adherence to ??7-day dosing window      Electronically signed by:   Lecil Tapp D Jonel Sick, RN

## 2025-01-27 NOTE — Patient Instructions (Signed)
 Please contact GI at [phone] to schedule GI appointment

## 2025-01-27 NOTE — Progress Notes (Signed)
 [facility] at [facility]     Reason for visit: Follow up    Questions / Concerns that need to be addressed: 2 wk follow up     Screening BP- 114/57    67        PTHomeBP     Diabetes:  Regularly checking blood sugars?: no  If yes, when? Complete log for past 7 days  Date Before Breakfast After Breakfast Before Lunch After Lunch Before Dinner After Dinner Before Bed                                                                                                                                     Dexcom or Libre CGM in use? If so, pull appropriate reporting through portal (Dexcom) or EPIC order [name].    HCDM reviewed and updated in Epic:    We are working to make sure all of our patients??? wishes are updated in Epic and part of that is documenting a Environmental Health Practitioner for each patient  A Health Care Decision Maker is someone you choose who can make health care decisions for you if you are not able - who would you most want to do this for you????  is already up to date.    HCDM (patient stated preference): Ryan Davenport, Ryan Davenport - Mother - [phone]    BPAs completed:      Annual Screenings:     __________________________________________________________________________________________    SCREENINGS COMPLETED IN FLOWSHEETS      AUDIT       PHQ2       PHQ9          GAD7       COPD Assessment       Falls Risk

## 2025-01-27 NOTE — Telephone Encounter (Signed)
 Called Ryan Davenport to see if I could reach him.  Stated he did not ever call anyone. Gave him [facility] specialty phone number and asked him to give them a call.   Informed him I do not have his medication at the clinic as of yet since he did not call them.

## 2025-01-28 MED FILL — CABENUVA 600 MG/3 ML-900 MG/3 ML IM SUSPENSION, EXTENDED RELEASE: INTRAMUSCULAR | 56 days supply | Qty: 6 | Fill #0

## 2025-01-29 LAB — PROTEIN ELECTROPHORESIS, SERUM
ALBUMIN (SPE): 3.3 g/dL — ABNORMAL LOW (ref 3.5–5.0)
ALPHA-1 GLOBULIN: 0.3 g/dL (ref 0.2–0.5)
ALPHA-2 GLOBULIN: 0.6 g/dL (ref 0.5–1.1)
BETA-1 GLOBULIN: 0.5 g/dL (ref 0.3–0.6)
BETA-2 GLOBULIN: 0.5 g/dL (ref 0.2–0.6)
GAMMAGLOBULIN: 5.5 g/dL — ABNORMAL HIGH (ref 0.5–1.5)
PROTEIN TOTAL (SPECIAL CHEM): 10.8 g/dL
